# Patient Record
Sex: Female | Born: 1955 | Race: White | Hispanic: No | Smoking: Former smoker
Health system: Southern US, Community
[De-identification: ages and names within clinical notes are randomized; demographics above are authoritative.]

## PROBLEM LIST (undated history)

## (undated) DIAGNOSIS — I2510 Atherosclerotic heart disease of native coronary artery without angina pectoris: Secondary | ICD-10-CM

## (undated) DIAGNOSIS — G8929 Other chronic pain: Secondary | ICD-10-CM

## (undated) DIAGNOSIS — N179 Acute kidney failure, unspecified: Secondary | ICD-10-CM

## (undated) DIAGNOSIS — R569 Unspecified convulsions: Secondary | ICD-10-CM

## (undated) DIAGNOSIS — K5792 Diverticulitis of intestine, part unspecified, without perforation or abscess without bleeding: Secondary | ICD-10-CM

## (undated) DIAGNOSIS — E785 Hyperlipidemia, unspecified: Secondary | ICD-10-CM

## (undated) DIAGNOSIS — I10 Essential (primary) hypertension: Secondary | ICD-10-CM

## (undated) DIAGNOSIS — M1990 Unspecified osteoarthritis, unspecified site: Secondary | ICD-10-CM

## (undated) DIAGNOSIS — G4730 Sleep apnea, unspecified: Secondary | ICD-10-CM

## (undated) DIAGNOSIS — E039 Hypothyroidism, unspecified: Secondary | ICD-10-CM

## (undated) DIAGNOSIS — I779 Disorder of arteries and arterioles, unspecified: Secondary | ICD-10-CM

## (undated) DIAGNOSIS — J45909 Unspecified asthma, uncomplicated: Secondary | ICD-10-CM

## (undated) DIAGNOSIS — I739 Peripheral vascular disease, unspecified: Secondary | ICD-10-CM

## (undated) DIAGNOSIS — M359 Systemic involvement of connective tissue, unspecified: Secondary | ICD-10-CM

## (undated) DIAGNOSIS — I255 Ischemic cardiomyopathy: Secondary | ICD-10-CM

## (undated) DIAGNOSIS — G44209 Tension-type headache, unspecified, not intractable: Secondary | ICD-10-CM

## (undated) DIAGNOSIS — K219 Gastro-esophageal reflux disease without esophagitis: Secondary | ICD-10-CM

## (undated) DIAGNOSIS — IMO0002 Reserved for concepts with insufficient information to code with codable children: Secondary | ICD-10-CM

## (undated) DIAGNOSIS — R001 Bradycardia, unspecified: Secondary | ICD-10-CM

## (undated) DIAGNOSIS — M549 Dorsalgia, unspecified: Secondary | ICD-10-CM

## (undated) DIAGNOSIS — D649 Anemia, unspecified: Secondary | ICD-10-CM

## (undated) DIAGNOSIS — F419 Anxiety disorder, unspecified: Secondary | ICD-10-CM

## (undated) DIAGNOSIS — J449 Chronic obstructive pulmonary disease, unspecified: Secondary | ICD-10-CM

## (undated) DIAGNOSIS — Z9289 Personal history of other medical treatment: Secondary | ICD-10-CM

## (undated) DIAGNOSIS — N200 Calculus of kidney: Secondary | ICD-10-CM

## (undated) DIAGNOSIS — F32A Depression, unspecified: Secondary | ICD-10-CM

## (undated) DIAGNOSIS — F329 Major depressive disorder, single episode, unspecified: Secondary | ICD-10-CM

## (undated) HISTORY — PX: TUBAL LIGATION: SHX77

## (undated) HISTORY — DX: Calculus of kidney: N20.0

## (undated) HISTORY — DX: Essential (primary) hypertension: I10

## (undated) HISTORY — DX: Diverticulitis of intestine, part unspecified, without perforation or abscess without bleeding: K57.92

## (undated) HISTORY — DX: Atherosclerotic heart disease of native coronary artery without angina pectoris: I25.10

## (undated) HISTORY — PX: APPENDECTOMY: SHX54

## (undated) HISTORY — DX: Hyperlipidemia, unspecified: E78.5

## (undated) HISTORY — DX: Gastro-esophageal reflux disease without esophagitis: K21.9

## (undated) HISTORY — DX: Chronic obstructive pulmonary disease, unspecified: J44.9

## (undated) HISTORY — PX: VAGINAL HYSTERECTOMY: SUR661

---

## 1999-10-06 HISTORY — PX: KNEE ARTHROSCOPY W/ ACL RECONSTRUCTION: SHX1858

## 1999-10-06 HISTORY — PX: CARPAL TUNNEL WITH CUBITAL TUNNEL: SHX5608

## 1999-11-26 ENCOUNTER — Ambulatory Visit (HOSPITAL_BASED_OUTPATIENT_CLINIC_OR_DEPARTMENT_OTHER): Admission: RE | Admit: 1999-11-26 | Discharge: 1999-11-27 | Payer: Self-pay | Admitting: Orthopedic Surgery

## 2001-01-22 ENCOUNTER — Emergency Department (HOSPITAL_COMMUNITY): Admission: EM | Admit: 2001-01-22 | Discharge: 2001-01-22 | Payer: Self-pay | Admitting: Emergency Medicine

## 2001-03-09 ENCOUNTER — Ambulatory Visit (HOSPITAL_COMMUNITY): Admission: RE | Admit: 2001-03-09 | Discharge: 2001-03-09 | Payer: Self-pay | Admitting: Family Medicine

## 2001-03-09 ENCOUNTER — Encounter: Payer: Self-pay | Admitting: Family Medicine

## 2001-07-11 ENCOUNTER — Encounter: Payer: Self-pay | Admitting: Emergency Medicine

## 2001-07-11 ENCOUNTER — Emergency Department (HOSPITAL_COMMUNITY): Admission: EM | Admit: 2001-07-11 | Discharge: 2001-07-11 | Payer: Self-pay | Admitting: Emergency Medicine

## 2001-08-04 ENCOUNTER — Encounter: Payer: Self-pay | Admitting: Internal Medicine

## 2001-08-04 ENCOUNTER — Ambulatory Visit (HOSPITAL_COMMUNITY): Admission: RE | Admit: 2001-08-04 | Discharge: 2001-08-04 | Payer: Self-pay | Admitting: Internal Medicine

## 2001-08-23 ENCOUNTER — Ambulatory Visit (HOSPITAL_BASED_OUTPATIENT_CLINIC_OR_DEPARTMENT_OTHER): Admission: RE | Admit: 2001-08-23 | Discharge: 2001-08-24 | Payer: Self-pay | Admitting: Orthopedic Surgery

## 2002-02-14 ENCOUNTER — Emergency Department (HOSPITAL_COMMUNITY): Admission: EM | Admit: 2002-02-14 | Discharge: 2002-02-14 | Payer: Self-pay | Admitting: *Deleted

## 2002-02-14 ENCOUNTER — Encounter: Payer: Self-pay | Admitting: *Deleted

## 2002-04-29 ENCOUNTER — Emergency Department (HOSPITAL_COMMUNITY): Admission: EM | Admit: 2002-04-29 | Discharge: 2002-04-29 | Payer: Self-pay | Admitting: Emergency Medicine

## 2002-04-29 ENCOUNTER — Encounter: Payer: Self-pay | Admitting: Emergency Medicine

## 2002-05-27 ENCOUNTER — Emergency Department (HOSPITAL_COMMUNITY): Admission: EM | Admit: 2002-05-27 | Discharge: 2002-05-27 | Payer: Self-pay | Admitting: *Deleted

## 2002-05-27 ENCOUNTER — Encounter: Payer: Self-pay | Admitting: *Deleted

## 2003-04-07 ENCOUNTER — Emergency Department (HOSPITAL_COMMUNITY): Admission: EM | Admit: 2003-04-07 | Discharge: 2003-04-07 | Payer: Self-pay | Admitting: *Deleted

## 2003-06-16 ENCOUNTER — Emergency Department (HOSPITAL_COMMUNITY): Admission: EM | Admit: 2003-06-16 | Discharge: 2003-06-16 | Payer: Self-pay | Admitting: Emergency Medicine

## 2003-10-06 ENCOUNTER — Emergency Department (HOSPITAL_COMMUNITY): Admission: EM | Admit: 2003-10-06 | Discharge: 2003-10-06 | Payer: Self-pay | Admitting: Emergency Medicine

## 2003-10-06 HISTORY — PX: CAROTID ENDARTERECTOMY: SUR193

## 2003-10-06 HISTORY — PX: CORONARY ARTERY BYPASS GRAFT: SHX141

## 2004-05-08 ENCOUNTER — Inpatient Hospital Stay (HOSPITAL_COMMUNITY): Admission: EM | Admit: 2004-05-08 | Discharge: 2004-05-19 | Payer: Self-pay | Admitting: Emergency Medicine

## 2004-05-13 ENCOUNTER — Encounter (INDEPENDENT_AMBULATORY_CARE_PROVIDER_SITE_OTHER): Payer: Self-pay | Admitting: Specialist

## 2004-05-15 ENCOUNTER — Encounter: Payer: Self-pay | Admitting: Cardiothoracic Surgery

## 2004-06-10 ENCOUNTER — Ambulatory Visit (HOSPITAL_COMMUNITY): Admission: RE | Admit: 2004-06-10 | Discharge: 2004-06-10 | Payer: Self-pay | Admitting: *Deleted

## 2004-09-09 ENCOUNTER — Ambulatory Visit: Payer: Self-pay | Admitting: Physician Assistant

## 2004-10-31 ENCOUNTER — Ambulatory Visit (HOSPITAL_COMMUNITY): Admission: RE | Admit: 2004-10-31 | Discharge: 2004-10-31 | Payer: Self-pay | Admitting: Family Medicine

## 2004-11-19 ENCOUNTER — Emergency Department (HOSPITAL_COMMUNITY): Admission: EM | Admit: 2004-11-19 | Discharge: 2004-11-20 | Payer: Self-pay | Admitting: Emergency Medicine

## 2004-12-08 ENCOUNTER — Ambulatory Visit: Payer: Self-pay | Admitting: *Deleted

## 2005-02-07 ENCOUNTER — Emergency Department (HOSPITAL_COMMUNITY): Admission: EM | Admit: 2005-02-07 | Discharge: 2005-02-07 | Payer: Self-pay | Admitting: Emergency Medicine

## 2005-03-27 ENCOUNTER — Ambulatory Visit: Payer: Self-pay | Admitting: Cardiovascular Disease

## 2006-01-24 ENCOUNTER — Emergency Department (HOSPITAL_COMMUNITY): Admission: EM | Admit: 2006-01-24 | Discharge: 2006-01-24 | Payer: Self-pay | Admitting: Emergency Medicine

## 2006-07-23 ENCOUNTER — Emergency Department (HOSPITAL_COMMUNITY): Admission: EM | Admit: 2006-07-23 | Discharge: 2006-07-23 | Payer: Self-pay | Admitting: Emergency Medicine

## 2006-07-26 ENCOUNTER — Ambulatory Visit: Payer: Self-pay | Admitting: Gastroenterology

## 2006-07-26 ENCOUNTER — Inpatient Hospital Stay (HOSPITAL_COMMUNITY): Admission: EM | Admit: 2006-07-26 | Discharge: 2006-08-02 | Payer: Self-pay | Admitting: Emergency Medicine

## 2006-07-26 ENCOUNTER — Ambulatory Visit: Payer: Self-pay | Admitting: Cardiology

## 2006-07-30 ENCOUNTER — Encounter (INDEPENDENT_AMBULATORY_CARE_PROVIDER_SITE_OTHER): Payer: Self-pay | Admitting: *Deleted

## 2008-05-11 ENCOUNTER — Emergency Department (HOSPITAL_COMMUNITY): Admission: EM | Admit: 2008-05-11 | Discharge: 2008-05-11 | Payer: Self-pay | Admitting: Emergency Medicine

## 2008-07-10 ENCOUNTER — Emergency Department (HOSPITAL_COMMUNITY): Admission: EM | Admit: 2008-07-10 | Discharge: 2008-07-10 | Payer: Self-pay | Admitting: Emergency Medicine

## 2008-10-05 HISTORY — PX: LAPAROSCOPIC CHOLECYSTECTOMY: SUR755

## 2008-12-15 ENCOUNTER — Emergency Department (HOSPITAL_COMMUNITY): Admission: EM | Admit: 2008-12-15 | Discharge: 2008-12-16 | Payer: Self-pay | Admitting: Emergency Medicine

## 2009-04-17 ENCOUNTER — Ambulatory Visit: Payer: Self-pay | Admitting: Internal Medicine

## 2009-04-17 ENCOUNTER — Inpatient Hospital Stay (HOSPITAL_COMMUNITY): Admission: EM | Admit: 2009-04-17 | Discharge: 2009-04-26 | Payer: Self-pay | Admitting: Emergency Medicine

## 2009-04-17 ENCOUNTER — Ambulatory Visit: Payer: Self-pay | Admitting: Cardiology

## 2009-04-18 ENCOUNTER — Ambulatory Visit: Payer: Self-pay | Admitting: Internal Medicine

## 2009-04-19 ENCOUNTER — Telehealth (INDEPENDENT_AMBULATORY_CARE_PROVIDER_SITE_OTHER): Payer: Self-pay | Admitting: *Deleted

## 2009-04-20 ENCOUNTER — Ambulatory Visit: Payer: Self-pay | Admitting: Gastroenterology

## 2009-04-21 ENCOUNTER — Ambulatory Visit: Payer: Self-pay | Admitting: Gastroenterology

## 2009-04-22 ENCOUNTER — Ambulatory Visit: Payer: Self-pay | Admitting: Gastroenterology

## 2009-04-23 ENCOUNTER — Encounter: Payer: Self-pay | Admitting: Cardiology

## 2009-04-24 ENCOUNTER — Ambulatory Visit: Payer: Self-pay | Admitting: Gastroenterology

## 2009-04-29 ENCOUNTER — Encounter: Payer: Self-pay | Admitting: Gastroenterology

## 2009-05-13 ENCOUNTER — Encounter: Payer: Self-pay | Admitting: Internal Medicine

## 2009-05-27 DIAGNOSIS — J449 Chronic obstructive pulmonary disease, unspecified: Secondary | ICD-10-CM | POA: Insufficient documentation

## 2009-05-27 DIAGNOSIS — R569 Unspecified convulsions: Secondary | ICD-10-CM

## 2009-05-27 DIAGNOSIS — F1910 Other psychoactive substance abuse, uncomplicated: Secondary | ICD-10-CM | POA: Insufficient documentation

## 2009-05-27 DIAGNOSIS — I10 Essential (primary) hypertension: Secondary | ICD-10-CM | POA: Insufficient documentation

## 2009-05-27 DIAGNOSIS — I739 Peripheral vascular disease, unspecified: Secondary | ICD-10-CM | POA: Insufficient documentation

## 2009-05-27 DIAGNOSIS — E785 Hyperlipidemia, unspecified: Secondary | ICD-10-CM | POA: Insufficient documentation

## 2009-05-27 DIAGNOSIS — K219 Gastro-esophageal reflux disease without esophagitis: Secondary | ICD-10-CM | POA: Insufficient documentation

## 2009-06-04 ENCOUNTER — Telehealth (INDEPENDENT_AMBULATORY_CARE_PROVIDER_SITE_OTHER): Payer: Self-pay

## 2009-06-24 ENCOUNTER — Ambulatory Visit: Payer: Self-pay | Admitting: Cardiology

## 2009-06-28 ENCOUNTER — Encounter (INDEPENDENT_AMBULATORY_CARE_PROVIDER_SITE_OTHER): Payer: Self-pay | Admitting: *Deleted

## 2009-06-28 ENCOUNTER — Ambulatory Visit (HOSPITAL_COMMUNITY): Admission: RE | Admit: 2009-06-28 | Discharge: 2009-06-28 | Payer: Self-pay | Admitting: Cardiology

## 2009-06-28 ENCOUNTER — Encounter: Payer: Self-pay | Admitting: Cardiology

## 2009-06-28 LAB — CONVERTED CEMR LAB
AST: 19 units/L
BUN: 10 mg/dL
Calcium: 10.1 mg/dL
Cholesterol: 200 mg/dL
Creatinine, Ser: 0.9 mg/dL
Glucose, Bld: 92 mg/dL
HDL: 35 mg/dL
Potassium: 4.6 meq/L
Triglycerides: 342 mg/dL

## 2009-07-01 LAB — CONVERTED CEMR LAB
ALT: 26 units/L (ref 0–35)
AST: 19 units/L (ref 0–37)
Alkaline Phosphatase: 84 units/L (ref 39–117)
Cholesterol: 200 mg/dL (ref 0–200)
Creatinine, Ser: 0.9 mg/dL (ref 0.40–1.20)
LDL Cholesterol: 97 mg/dL (ref 0–99)
Sodium: 141 meq/L (ref 135–145)
Total Bilirubin: 0.3 mg/dL (ref 0.3–1.2)
Total CHOL/HDL Ratio: 5.7
Total Protein: 7.6 g/dL (ref 6.0–8.3)
VLDL: 68 mg/dL — ABNORMAL HIGH (ref 0–40)

## 2009-07-02 ENCOUNTER — Encounter (INDEPENDENT_AMBULATORY_CARE_PROVIDER_SITE_OTHER): Payer: Self-pay | Admitting: *Deleted

## 2009-07-02 ENCOUNTER — Encounter: Payer: Self-pay | Admitting: Cardiology

## 2009-07-02 ENCOUNTER — Telehealth (INDEPENDENT_AMBULATORY_CARE_PROVIDER_SITE_OTHER): Payer: Self-pay | Admitting: *Deleted

## 2009-07-08 ENCOUNTER — Telehealth (INDEPENDENT_AMBULATORY_CARE_PROVIDER_SITE_OTHER): Payer: Self-pay

## 2009-07-09 ENCOUNTER — Telehealth (INDEPENDENT_AMBULATORY_CARE_PROVIDER_SITE_OTHER): Payer: Self-pay

## 2009-07-09 ENCOUNTER — Telehealth (INDEPENDENT_AMBULATORY_CARE_PROVIDER_SITE_OTHER): Payer: Self-pay | Admitting: *Deleted

## 2009-07-10 ENCOUNTER — Telehealth (INDEPENDENT_AMBULATORY_CARE_PROVIDER_SITE_OTHER): Payer: Self-pay

## 2009-07-10 ENCOUNTER — Encounter: Payer: Self-pay | Admitting: Gastroenterology

## 2009-07-16 ENCOUNTER — Ambulatory Visit (HOSPITAL_COMMUNITY): Admission: RE | Admit: 2009-07-16 | Discharge: 2009-07-16 | Payer: Self-pay | Admitting: Gastroenterology

## 2009-07-17 ENCOUNTER — Ambulatory Visit: Payer: Self-pay | Admitting: Cardiology

## 2009-07-17 ENCOUNTER — Ambulatory Visit (HOSPITAL_COMMUNITY): Admission: RE | Admit: 2009-07-17 | Discharge: 2009-07-17 | Payer: Self-pay | Admitting: Cardiology

## 2009-07-17 ENCOUNTER — Encounter: Payer: Self-pay | Admitting: Cardiology

## 2009-07-18 ENCOUNTER — Telehealth: Payer: Self-pay | Admitting: Cardiology

## 2009-07-24 ENCOUNTER — Encounter (INDEPENDENT_AMBULATORY_CARE_PROVIDER_SITE_OTHER): Payer: Self-pay | Admitting: *Deleted

## 2009-07-24 ENCOUNTER — Encounter: Payer: Self-pay | Admitting: Cardiology

## 2009-09-18 ENCOUNTER — Encounter (INDEPENDENT_AMBULATORY_CARE_PROVIDER_SITE_OTHER): Payer: Self-pay | Admitting: *Deleted

## 2009-10-05 HISTORY — PX: CORONARY ANGIOPLASTY WITH STENT PLACEMENT: SHX49

## 2009-10-10 ENCOUNTER — Telehealth (INDEPENDENT_AMBULATORY_CARE_PROVIDER_SITE_OTHER): Payer: Self-pay

## 2009-10-14 ENCOUNTER — Encounter (INDEPENDENT_AMBULATORY_CARE_PROVIDER_SITE_OTHER): Payer: Self-pay | Admitting: *Deleted

## 2009-12-18 ENCOUNTER — Ambulatory Visit: Payer: Self-pay | Admitting: Cardiology

## 2009-12-18 DIAGNOSIS — R079 Chest pain, unspecified: Secondary | ICD-10-CM | POA: Insufficient documentation

## 2010-01-17 ENCOUNTER — Telehealth: Payer: Self-pay | Admitting: Cardiology

## 2010-01-23 ENCOUNTER — Ambulatory Visit: Payer: Self-pay | Admitting: Cardiology

## 2010-01-23 DIAGNOSIS — Z951 Presence of aortocoronary bypass graft: Secondary | ICD-10-CM | POA: Insufficient documentation

## 2010-01-23 DIAGNOSIS — R519 Headache, unspecified: Secondary | ICD-10-CM | POA: Insufficient documentation

## 2010-01-23 DIAGNOSIS — I209 Angina pectoris, unspecified: Secondary | ICD-10-CM | POA: Insufficient documentation

## 2010-01-23 DIAGNOSIS — R51 Headache: Secondary | ICD-10-CM

## 2010-02-05 ENCOUNTER — Ambulatory Visit: Payer: Self-pay | Admitting: Cardiology

## 2010-02-05 ENCOUNTER — Encounter (HOSPITAL_COMMUNITY): Admission: RE | Admit: 2010-02-05 | Discharge: 2010-02-05 | Payer: Self-pay | Admitting: Cardiology

## 2010-02-10 ENCOUNTER — Telehealth (INDEPENDENT_AMBULATORY_CARE_PROVIDER_SITE_OTHER): Payer: Self-pay

## 2010-02-14 ENCOUNTER — Ambulatory Visit: Payer: Self-pay | Admitting: Cardiology

## 2010-02-14 ENCOUNTER — Encounter (INDEPENDENT_AMBULATORY_CARE_PROVIDER_SITE_OTHER): Payer: Self-pay | Admitting: *Deleted

## 2010-02-17 ENCOUNTER — Inpatient Hospital Stay (HOSPITAL_BASED_OUTPATIENT_CLINIC_OR_DEPARTMENT_OTHER): Admission: RE | Admit: 2010-02-17 | Discharge: 2010-02-17 | Payer: Self-pay | Admitting: Cardiology

## 2010-02-17 ENCOUNTER — Encounter (INDEPENDENT_AMBULATORY_CARE_PROVIDER_SITE_OTHER): Payer: Self-pay | Admitting: *Deleted

## 2010-02-17 ENCOUNTER — Ambulatory Visit: Payer: Self-pay | Admitting: Cardiology

## 2010-02-17 LAB — CONVERTED CEMR LAB
ALT: 19 units/L (ref 0–35)
AST: 18 units/L (ref 0–37)
Albumin: 4.6 g/dL (ref 3.5–5.2)
Calcium: 9.8 mg/dL (ref 8.4–10.5)
Chloride: 100 meq/L (ref 96–112)
Eosinophils Absolute: 0.2 10*3/uL (ref 0.0–0.7)
Eosinophils Relative: 3 % (ref 0–5)
HCT: 38.4 % (ref 36.0–46.0)
Hemoglobin: 12.4 g/dL (ref 12.0–15.0)
INR: 1.09 (ref <1.50)
Lymphocytes Relative: 30 % (ref 12–46)
Lymphs Abs: 2.5 10*3/uL (ref 0.7–4.0)
MCV: 93 fL (ref 78.0–100.0)
Monocytes Absolute: 0.6 10*3/uL (ref 0.1–1.0)
Potassium: 4.5 meq/L (ref 3.5–5.3)
RDW: 14.4 % (ref 11.5–15.5)
Total Protein: 7.4 g/dL (ref 6.0–8.3)
aPTT: 32 s (ref 24–37)

## 2010-02-20 ENCOUNTER — Encounter: Payer: Self-pay | Admitting: Cardiology

## 2010-02-20 ENCOUNTER — Telehealth (INDEPENDENT_AMBULATORY_CARE_PROVIDER_SITE_OTHER): Payer: Self-pay

## 2010-02-21 LAB — CONVERTED CEMR LAB
Calcium: 9.1 mg/dL (ref 8.4–10.5)
Creatinine, Ser: 1.25 mg/dL — ABNORMAL HIGH (ref 0.40–1.20)

## 2010-02-24 ENCOUNTER — Inpatient Hospital Stay (HOSPITAL_COMMUNITY): Admission: RE | Admit: 2010-02-24 | Discharge: 2010-02-25 | Payer: Self-pay | Admitting: Cardiology

## 2010-02-24 ENCOUNTER — Ambulatory Visit: Payer: Self-pay | Admitting: Cardiology

## 2010-03-25 ENCOUNTER — Encounter (INDEPENDENT_AMBULATORY_CARE_PROVIDER_SITE_OTHER): Payer: Self-pay | Admitting: *Deleted

## 2010-04-09 ENCOUNTER — Encounter: Payer: Self-pay | Admitting: Cardiology

## 2010-04-27 ENCOUNTER — Emergency Department (HOSPITAL_COMMUNITY): Admission: EM | Admit: 2010-04-27 | Discharge: 2010-04-27 | Payer: Self-pay | Admitting: Emergency Medicine

## 2010-06-06 ENCOUNTER — Encounter: Payer: Self-pay | Admitting: Cardiology

## 2010-06-27 ENCOUNTER — Encounter (INDEPENDENT_AMBULATORY_CARE_PROVIDER_SITE_OTHER): Payer: Self-pay | Admitting: *Deleted

## 2010-07-03 ENCOUNTER — Ambulatory Visit (HOSPITAL_COMMUNITY): Admission: RE | Admit: 2010-07-03 | Discharge: 2010-07-03 | Payer: Self-pay | Admitting: Cardiology

## 2010-07-04 ENCOUNTER — Encounter: Payer: Self-pay | Admitting: Cardiology

## 2010-07-09 ENCOUNTER — Telehealth (INDEPENDENT_AMBULATORY_CARE_PROVIDER_SITE_OTHER): Payer: Self-pay

## 2010-07-14 ENCOUNTER — Ambulatory Visit: Payer: Self-pay | Admitting: Internal Medicine

## 2010-07-14 DIAGNOSIS — K5732 Diverticulitis of large intestine without perforation or abscess without bleeding: Secondary | ICD-10-CM

## 2010-07-14 DIAGNOSIS — R197 Diarrhea, unspecified: Secondary | ICD-10-CM | POA: Insufficient documentation

## 2010-07-14 HISTORY — DX: Diverticulitis of large intestine without perforation or abscess without bleeding: K57.32

## 2010-07-15 ENCOUNTER — Ambulatory Visit (HOSPITAL_COMMUNITY): Admission: RE | Admit: 2010-07-15 | Discharge: 2010-07-15 | Payer: Self-pay | Admitting: Internal Medicine

## 2010-07-16 ENCOUNTER — Telehealth (INDEPENDENT_AMBULATORY_CARE_PROVIDER_SITE_OTHER): Payer: Self-pay

## 2010-07-23 ENCOUNTER — Telehealth (INDEPENDENT_AMBULATORY_CARE_PROVIDER_SITE_OTHER): Payer: Self-pay

## 2010-08-08 ENCOUNTER — Ambulatory Visit: Payer: Self-pay | Admitting: Cardiology

## 2010-08-08 ENCOUNTER — Telehealth (INDEPENDENT_AMBULATORY_CARE_PROVIDER_SITE_OTHER): Payer: Self-pay

## 2010-08-08 ENCOUNTER — Inpatient Hospital Stay (HOSPITAL_COMMUNITY): Admission: EM | Admit: 2010-08-08 | Discharge: 2010-08-12 | Payer: Self-pay | Admitting: Cardiology

## 2010-08-08 ENCOUNTER — Encounter: Payer: Self-pay | Admitting: Emergency Medicine

## 2010-09-05 ENCOUNTER — Ambulatory Visit: Payer: Self-pay | Admitting: Cardiology

## 2010-09-19 ENCOUNTER — Encounter: Payer: Self-pay | Admitting: Gastroenterology

## 2010-09-19 ENCOUNTER — Telehealth (INDEPENDENT_AMBULATORY_CARE_PROVIDER_SITE_OTHER): Payer: Self-pay | Admitting: *Deleted

## 2010-10-03 ENCOUNTER — Telehealth (INDEPENDENT_AMBULATORY_CARE_PROVIDER_SITE_OTHER): Payer: Self-pay | Admitting: *Deleted

## 2010-10-04 ENCOUNTER — Emergency Department (HOSPITAL_COMMUNITY)
Admission: EM | Admit: 2010-10-04 | Discharge: 2010-10-04 | Payer: Self-pay | Source: Home / Self Care | Admitting: Emergency Medicine

## 2010-10-05 HISTORY — PX: CARDIAC CATHETERIZATION: SHX172

## 2010-10-07 ENCOUNTER — Ambulatory Visit: Admit: 2010-10-07 | Payer: Self-pay | Admitting: Cardiology

## 2010-10-24 ENCOUNTER — Encounter (INDEPENDENT_AMBULATORY_CARE_PROVIDER_SITE_OTHER): Payer: Self-pay | Admitting: *Deleted

## 2010-10-24 ENCOUNTER — Ambulatory Visit: Admit: 2010-10-24 | Payer: Self-pay | Admitting: Adult Health

## 2010-10-26 ENCOUNTER — Encounter: Payer: Self-pay | Admitting: Cardiology

## 2010-10-30 ENCOUNTER — Telehealth (INDEPENDENT_AMBULATORY_CARE_PROVIDER_SITE_OTHER): Payer: Self-pay

## 2010-10-30 ENCOUNTER — Inpatient Hospital Stay (HOSPITAL_COMMUNITY)
Admission: EM | Admit: 2010-10-30 | Discharge: 2010-11-01 | Payer: Self-pay | Source: Home / Self Care | Attending: Cardiology | Admitting: Cardiology

## 2010-10-30 LAB — BASIC METABOLIC PANEL
BUN: 20 mg/dL (ref 6–23)
CO2: 27 mEq/L (ref 19–32)
Creatinine, Ser: 1.15 mg/dL (ref 0.4–1.2)
Glucose, Bld: 117 mg/dL — ABNORMAL HIGH (ref 70–99)
Potassium: 4.1 mEq/L (ref 3.5–5.1)
Sodium: 139 mEq/L (ref 135–145)

## 2010-10-30 LAB — PROTIME-INR
INR: 0.94 (ref 0.00–1.49)
Prothrombin Time: 12.8 seconds (ref 11.6–15.2)

## 2010-10-30 LAB — POCT CARDIAC MARKERS
CKMB, poc: 1.9 ng/mL (ref 1.0–8.0)
Myoglobin, poc: 55.3 ng/mL (ref 12–200)

## 2010-10-30 LAB — DIFFERENTIAL
Basophils Relative: 1 % (ref 0–1)
Eosinophils Absolute: 0.2 10*3/uL (ref 0.0–0.7)
Eosinophils Relative: 2 % (ref 0–5)
Lymphs Abs: 1.8 10*3/uL (ref 0.7–4.0)
Monocytes Absolute: 0.6 10*3/uL (ref 0.1–1.0)
Monocytes Relative: 8 % (ref 3–12)

## 2010-10-30 LAB — CBC
HCT: 33.1 % — ABNORMAL LOW (ref 36.0–46.0)
MCV: 91.4 fL (ref 78.0–100.0)

## 2010-10-30 LAB — BRAIN NATRIURETIC PEPTIDE: Pro B Natriuretic peptide (BNP): 314 pg/mL — ABNORMAL HIGH (ref 0.0–100.0)

## 2010-10-30 LAB — CK TOTAL AND CKMB (NOT AT ARMC)
CK, MB: 2.9 ng/mL (ref 0.3–4.0)
Relative Index: 2.4 (ref 0.0–2.5)

## 2010-10-30 LAB — MRSA PCR SCREENING: MRSA by PCR: NEGATIVE

## 2010-10-30 LAB — MAGNESIUM: Magnesium: 2.2 mg/dL (ref 1.5–2.5)

## 2010-10-31 LAB — T4, FREE: Free T4: 0.62 ng/dL — ABNORMAL LOW (ref 0.80–1.80)

## 2010-10-31 LAB — TSH
TSH: 48.288 u[IU]/mL — ABNORMAL HIGH (ref 0.350–4.500)
TSH: 16.793 u[IU]/mL — ABNORMAL HIGH (ref 0.350–4.500)

## 2010-10-31 LAB — LIPID PANEL
Cholesterol: 169 mg/dL (ref 0–200)
LDL Cholesterol: 55 mg/dL (ref 0–99)
Triglycerides: 358 mg/dL — ABNORMAL HIGH (ref <150)
VLDL: 72 mg/dL — ABNORMAL HIGH (ref 0–40)

## 2010-10-31 LAB — BASIC METABOLIC PANEL
BUN: 26 mg/dL — ABNORMAL HIGH (ref 6–23)
Chloride: 102 mEq/L (ref 96–112)
Creatinine, Ser: 0.85 mg/dL (ref 0.4–1.2)
Glucose, Bld: 125 mg/dL — ABNORMAL HIGH (ref 70–99)
Potassium: 4.3 mEq/L (ref 3.5–5.1)

## 2010-10-31 LAB — CBC
HCT: 32.3 % — ABNORMAL LOW (ref 36.0–46.0)
Hemoglobin: 10.2 g/dL — ABNORMAL LOW (ref 12.0–15.0)
MCH: 29.7 pg (ref 26.0–34.0)
MCHC: 31.6 g/dL (ref 30.0–36.0)
RDW: 14.7 % (ref 11.5–15.5)

## 2010-10-31 LAB — T3, FREE: T3, Free: 2.4 pg/mL (ref 2.3–4.2)

## 2010-10-31 LAB — CARDIAC PANEL(CRET KIN+CKTOT+MB+TROPI)
CK, MB: 3.1 ng/mL (ref 0.3–4.0)
CK, MB: 3.2 ng/mL (ref 0.3–4.0)
Total CK: 120 U/L (ref 7–177)
Total CK: 92 U/L (ref 7–177)

## 2010-10-31 LAB — HEPARIN LEVEL (UNFRACTIONATED): Heparin Unfractionated: 0.18 IU/mL — ABNORMAL LOW (ref 0.30–0.70)

## 2010-10-31 NOTE — Discharge Summary (Addendum)
Toni [name], [name] NO.:  [id number]  MEDICAL RECORD NO.:  [id number]          PATIENT TYPE:  INP  LOCATION:  2604                         FACILITY:  [facility]  PHYSICIAN:  Marca Ancona, MD      DATE OF BIRTH:  11/25/1955  DATE OF ADMISSION:  10/30/2010 DATE OF DISCHARGE:                              DISCHARGE SUMMARY   PRIMARY CARDIOLOGIST:  Jesse Sans. Wall, MD, [facility]  PRIMARY MEDICAL DOCTOR:  [facility].  CHIEF COMPLAINT:  Chest pain.  HISTORY OF PRESENT ILLNESS:  Toni [name] is a 55 year old female with a complicated history of CAD, hypertension, PVD, and ischemic cardiomyopathy with an EF last assessed at 35-40% who presents with an episode of chest pain.  She states over the past several weeks, she has had exertional chest pain, like a tightness whenever she exerts herself. She has had to take several nitroglycerin on a daily basis.  This morning around 11 while walking to her mail box, felt hurting and tightness in her chest with some radiation to her left shoulder and arm. She also gets short of breath as well as orthopnea and is not able to sleep lying down.  With this particular episode with the the mail box, sat down, took 1 nitroglycerin.  10-15 minutes later, she still felt her chest was heavy and did not feel right, so took a second nitroglycerin. With the third nitroglycerin, she called [facility] and was told to call 911 as the pain did not subside in 5 minutes.  She ended up calling EMS.  En route, they gave her morphine which did help to ease off her pain and she is since feeling better on a nitro drip here.  Her EKG shows normal sinus rhythm with nonspecific slight anterior ST depression with lateral T-wave inversions.  There is a loss of RSR pattern seen on her December 2011 EKG.  Cardiac enzymes were negative x1.  PAST MEDICAL HISTORY: 1. Coronary artery disease.     a.     Status post CABG in 2005 with LIMA  to the large diagonal,      SVG to the LAD, SVG to the circumflex, and SVG to the PDA.     b.     Status post PCI and bare-metal stenting with Zilver stent on      Feb 25, 2010.     c. NSTEMI with stenosis in the stents and segment of the mid body of     the SVG to the PDA status post successful PTCA/ION drug-eluting     stent placement August 11, 2010.  Two out of four grafts were     patent by cath at that time. 2. Ischemic cardiomyopathy with EF of 35-40% by cath August 11, 2010. 3. Hyperlipidemia. 4. GERD. 5. Tobacco abuse. 6. COPD. 7. Anxiety. 8. Peripheral vascular disease with bilateral carotid artery stenosis     50-69% by Doppler in 2011. 9.History of remote seizure disorder.  The patient states she has     been experiencing some stress-induced seizures and had 2 of them     pn  New Years' and is taking carbamazepine for this. 12.History of diverticulitis. 13.Asymptomatic sinus bradycardia. 14.History of nonsustained VT. 15.Hypothyroidism.  MEDICATIONS: 1. Alprazolam 1 mg q.i.d. p.r.n. 2. Prozac 20 mg daily. 3. Lovastatin 40 mg 2 tablets at bedtime. 4. Trazodone 50 mg 1/2 tablet to 1 tablet as needed. 5. Aleve 220 mg daily as needed. 6. Nitrostat 0.4 mg sublingual as needed. 7. Nexium 40 mg daily. 8. Effient 10 mg daily. 9. Metoprolol tartrate 25 mg 1/2 tablet b.i.d. 10.Synthroid 50 mcg daily. 11.Aspirin 325 mg daily. 12.Ultracet 37.5/325 as directed for pain. 13.Benazepril  20 mg q.a.m. 14.Carbamazepine 200 mg b.i.d.  ALLERGIES:  No known drug allergies.  SOCIAL HISTORY:  Toni [name] lives in [place] with her husband.  She has 3 children.  She quit smoking but still states she is in environment where she is exposed to much second hand smoke.  She denies any alcohol use.  FAMILY HISTORY:  Her mother passed away of an MI at age 30.  Father passed away of an MI at 69.  REVIEW OF SYSTEMS:  No fevers, chills, nausea, or vomiting.  Positive for chest pain,  shortness of breath, and dyspnea on exertion as above. All other systems reviewed and otherwise negative except for those noted in the HPI.  LABORATORY DATA:  WBC 7.3, hemoglobin 10.6, hematocrit 33.1, platelet count 274,000.  Sodium 139, potassium 4.1, chloride 105, CO2 27, glucose 117, BUN 20, creatinine 1.15.  Cardiac enzymes negative x1.  Chest x-ray, cardiomegaly with no pulmonary edema.  EKG, normal sinus rhythm, nonspecific slight anterior ST depression with lateral T-wave inversions with loss of RSR pattern seen in prior EKG December 2011.  PHYSICAL EXAMINATION:  VITAL SIGNS:  Temperature 98.2, pulse 62, respirations 20, blood pressure 130/67, pulse ox 96% on 2 liters per minute. GENERAL:  This patient is an obese white female in no acute distress who appears elder than stated age. HEENT:  Normocephalic, atraumatic with extraocular movements intact. Clear sclerae.  Nares are without discharge. NECK:  Thick with a soft bilateral carotid bruit.  She has JVD at 9 cm. HEART:  Auscultation reveals regular rate and rhythm, S1, S2 with 2/6 systolic ejection murmur.  Pulses are 2+ and equal. LUNGS:  Has slight crackles at the bases bilaterally. ABDOMEN:  Soft, obese, nontender, nondistended with positive bowel sounds. EXTREMITIES:  Warm and dry without edema. NEUROLOGIC:  She is alert and oriented x3, responds to questions appropriately with a normal affect.  ASSESSMENT/PLAN:  The patient was seen and examined by Dr. Shirlee Latch and myself.  This is a 55 year old female with past medical history including coronary artery disease and CABG and ischemic cardiomyopathy who presents with prolonged chest pain.  She has had exertional chest pain, dyspnea for approximately 2 weeks since her last PCI but has done acutely worse today.  She is currently on a nitro drip and comfortable. Enzymes are negative x1 and EKG is slightly different from those 4. 1. Chest pain worrisome for unstable  angina.  She has had exertional     chest pain since about 2 weeks post PCI since November 2011, but     has always resolved with rest and 1 nitroglycerin.  Today with     multiple nitroglycerin doses, she has had no resolution.  It lasted     approximately for 30-45 minutes and resolves with EMS dosing on     morphine.  At this time, we plan to admit and cycle cardiac     enzymes.  We will check serial EKGs.  She will be started on     heparin drip and her Effient and aspirin as well as other cardiac     medications will be continued.  We will plan for left heart     catheterization in the morning.  If she has no option for     intervention, she may need more aggressive antianginal treatment     with medications such as Imdur, plus or minus ranolazine. 2. Congestive heart failure.  The patient is volume overloaded on     exam, though not severely.  She has New York Heart Association     class III symptoms.  EF is 35-40% by LV gram November 2011.  At     this time, we will check a BMP as well as a 2-D echocardiogram.     She is not on any home Lasix and we will try Lasix 40 mg IV b.i.d.     for now with her morning dose held in anticipation for     catheterization.  Her Lopressor will be changed to Coreg for herheart failure and her ACE will be continued.     Ronie Spies, P.A.C.   ______________________________ Marca Ancona, MD    DD/MEDQ  D:  10/30/2010  T:  10/31/2010  Job:  [id number]  cc:   Thomas C. Wall, MD, [facility] Department  Electronically Signed by Ronie Spies  on 10/31/2010 10:44:37 AM Electronically Signed by Marca Ancona MD on 11/13/2010 08:33:43 AM

## 2010-11-01 LAB — CBC
Hemoglobin: 10.8 g/dL — ABNORMAL LOW (ref 12.0–15.0)
MCHC: 31.5 g/dL (ref 30.0–36.0)
RBC: 3.69 MIL/uL — ABNORMAL LOW (ref 3.87–5.11)
WBC: 9.3 10*3/uL (ref 4.0–10.5)

## 2010-11-01 LAB — BASIC METABOLIC PANEL
Chloride: 97 mEq/L (ref 96–112)
Creatinine, Ser: 0.86 mg/dL (ref 0.4–1.2)
GFR calc Af Amer: >60 mL/min (ref >60)
Potassium: 3.8 mEq/L (ref 3.5–5.1)

## 2010-11-01 LAB — HEPARIN LEVEL (UNFRACTIONATED): Heparin Unfractionated: <0.1 IU/mL — ABNORMAL LOW (ref 0.30–0.70)

## 2010-11-03 LAB — POCT I-STAT 3, ART BLOOD GAS (G3+)
pCO2 arterial: 49.5 mmHg — ABNORMAL HIGH (ref 35.0–45.0)
pH, Arterial: 7.348 — ABNORMAL LOW (ref 7.350–7.400)

## 2010-11-03 LAB — POCT I-STAT 3, VENOUS BLOOD GAS (G3P V)
TCO2: 29 mmol/L (ref 0–100)
pCO2, Ven: 51.4 mmHg — ABNORMAL HIGH (ref 45.0–50.0)
pH, Ven: 7.329 — ABNORMAL HIGH (ref 7.250–7.300)

## 2010-11-03 LAB — POCT ACTIVATED CLOTTING TIME: Activated Clotting Time: 117 seconds

## 2010-11-06 NOTE — Letter (Signed)
Summary: Cardiac Catheterization Instructions- JV Lab  [facility] [place]  [address] [place] [address]   Phone: [phone]  Fax: [phone]     02/14/2010 MRN: [id number]  [name] [address] [place], [place]  [address]  Dear Ms. Cress,   You are scheduled for a Cardiac Catheterization on 02/17/2010 with Dr.Brodie.  Please arrive to the 1st floor of the Heart and Vascular Center at [facility] at 10:00 am  on the day of your procedure. Please do not arrive before 6:30 a.m. Call the Heart and Vascular Center at [phone] if you are unable to make your appointmnet. The Code to get into the parking garage under the building is0010. Take the elevators to the 1st floor. You must have someone to drive you home. Someone must be with you for the first 24 hours after you arrive home. Please wear clothes that are easy to get on and off and wear slip-on shoes. Do not eat or drink after midnight except water with your medications that morning. Bring all your medications and current insurance cards with you.  ___ DO NOT take these medications before your procedure: ________________________________________________________________  ___ Make sure you take your aspirin.  _x__ You may take ALL of your medications with water that morning. ________________________________________________________________________________________________________________________________  ___ DO NOT take ANY medications before your procedure.  ___ Pre-med instructions:  ________________________________________________________________________________________________________________________________  The usual length of stay after your procedure is 2 to 3 hours. This can vary.  If you have any questions, please call the office at the number listed above.   Teressa Lower RN

## 2010-11-06 NOTE — Miscellaneous (Signed)
Summary: Orders Update  Clinical Lists Changes  Orders: Added new Referral order of Carotid Duplex (Carotid Duplex) - Signed 

## 2010-11-06 NOTE — Progress Notes (Signed)
Summary: Results  Phone Note Call from Patient   Caller: Patient Reason for Call: Lab or Test Results Summary of Call: pt would like results of stress test done last week/tg Initial call taken by: Raechel Ache [facility],  Feb 10, 2010 11:36 AM  Follow-up for Phone Call        Phone is busy at this time, will continue to call. Follow-up by: Larita Fife Via LPN,  Feb 10, 8118 1:23 PM

## 2010-11-06 NOTE — Progress Notes (Signed)
Summary: pt request change in pain meds  Phone Note Call from Patient Call back at Home Phone [phone]   Caller: Patient Summary of Call: pt called-she was given vicodin 5/500 and it is not helping at all. pt stated it was just like taking tylenol. pt requesting something else for pain. please advise Initial call taken by: Hendricks Limes LPN,  July 16, 2010 2:10 PM     Appended Document: pt request change in pain meds    Prescriptions: OXYCODONE-ACETAMINOPHEN 7.5-325 MG TABS (OXYCODONE-ACETAMINOPHEN) one by mouth every 6 hours as needed pain  #20 x 0   Entered and Authorized by:   Leanna Battles. Dixon Boos   Signed by:   Leanna Battles Lewis PA-C on 07/16/2010   Method used:   Print then Give to Patient   RxID:   [id number]    PATIENT WILL HAVE TO PICK UP RX. SHE SHOULD NOT USE THIS WITH THE VICODIN.  Appended Document: pt request change in pain meds pt aware. Rx at front desk

## 2010-11-06 NOTE — Letter (Signed)
Summary: Appointment - Missed  [facility] [place]  [address], [place] [address]   Phone: [phone]  Fax: [phone]     March 25, 2010 MRN: [id number]   BROOKLIN RIEGER [address] [place], [place]  [address]   Dear Ms. Harju,  Our records indicate you missed your appointment on        03/25/10 Joni Reining NP      It is very important that we reach you to reschedule this appointment. We look forward to participating in your health care needs. Please contact us at the number listed above at your earliest convenience to reschedule this appointment.     Sincerely,    Glass blower/designer

## 2010-11-06 NOTE — Letter (Signed)
Summary: Appointment - Missed  [facility] [place]  [address] [place] [address]   Phone: [phone]  Fax: [phone]     October 24, 2010 MRN: [id number]   [name] [address] [place], [place]  [address]   Dear [name],  Our records indicate you missed your appointment on       10/24/10 Joni Reining NP                 It is very important that we reach you to reschedule this appointment. We look forward to participating in your health care needs. Please contact us at the number listed above at your earliest convenience to reschedule this appointment.     Sincerely,    Glass blower/designer

## 2010-11-06 NOTE — Assessment & Plan Note (Signed)
Summary: 2-3 wk f/u per checkout on 02-03-2010/tg   Visit Type:  Follow-up Primary Provider:  [facility] Department  CC:  chest pain using nitro.  History of Present Illness: [name] returns today for followup of her history of exertional chest pain evaluated by Dr. Diona [name].  She then having exertional angina for about 7 or 8 months. Stress Myoview showed an EF of 44% with inferior lateral scar and superimposed ischemia.  She had bypass surgery only in 2005 at age 89. At that time showed an ejection fraction of 55% with posterior akinesia. She also had a carotid endarterectomy.  Unfortunately, she is not taking great care result. She continues to smoke. She was almost in total denial today and shock when I told her that she had heart cath.  Current Medications (verified): 1)  Alprazolam 1 Mg Tabs (Alprazolam) .... Four Times A Day As Needed 2)  Prozac 40 Mg Caps (Fluoxetine Hcl) .... Take 1 Tablet By Mouth Once A Day 3)  Lovastatin 20 Mg Tabs (Lovastatin) .... Take One Tablet By Mouth Daily At Bedtime 4)  Plavix 75 Mg Tabs (Clopidogrel Bisulfate) .... Take One Tablet By Mouth Daily 5)  Benazepril Hcl 40 Mg Tabs (Benazepril Hcl) .... Take 1 Tablet By Mouth Once A Day 6)  Toprol Xl 50 Mg Xr24h-Tab (Metoprolol Succinate) .... Take 1 Tablet By Mouth Once A Day 7)  Trazodone Hcl 50 Mg Tabs (Trazodone Hcl) .... Take 1/2 Tab As Needed 8)  Aleve 220 Mg Tabs (Naproxen Sodium) .... As Needed 9)  Norvasc 5 Mg Tabs (Amlodipine Besylate) .... Take 1 Tablet By Mouth Once Daily 10)  Nitrostat 0.4 Mg Subl (Nitroglycerin) .Marland Kitchen.. 1 Tablet Under Tongue At Onset of Chest Pain; You May Repeat Every 5 Minutes For Up To 3 Doses.  Allergies (verified): No Known Drug Allergies  Past History:  Past Medical History: Last updated: 02-03-10 Hyperlipidemia Carotid artery disease, bilateral 50-69% ICA 9/10 Anxiety GERD COPD Hypertension Seizure disorder CAD - multivessel, LVEF 50-55% with  posterior akinesis  Past Surgical History: Last updated: Feb 03, 2010 Carotid endarectomy 2005 Left knee repair  Right shoulder and elbow repair Hysterectomy CABG 2005 - LIMA to AL, SVG to LAD, SVG to circumflex, SVG to PDA  Family History: Last updated: Feb 03, 2010 Father: died age 83 with MI Mother: died age 73 with MI  Social History: Last updated: 02-03-2010 Tobacco Use - Yes Alcohol Use - yes Regular Exercise - no Drug Use - yes (former), polysubstance abuse  Risk Factors: Exercise: no (05/27/2009)  Risk Factors: Smoking Status: current (05/27/2009)  Review of Systems       negative history of present illness  Vital Signs:  Patient profile:   55 year old female Weight:      228 pounds Pulse rate:   61 / minute BP sitting:   152 / 75  (right arm)  Vitals Entered By: Toni Saa, CNA (Feb 14, 2010 3:20 PM)  Physical Exam  General:  obese and unkept.   Head:  normocephalic and atraumatic Eyes:  PERRLA/EOM intact; conjunctiva and lids normal. Neck:  Neck supple, no JVD. No masses, thyromegaly or abnormal cervical nodes. Chest [name]:  no deformities or breast masses noted Lungs:  Clear bilaterally to auscultation and percussion. Heart:  Non-displaced PMI, chest non-tender; regular rate and rhythm, S1, S2 without murmurs, rubs or gallops. Carotid upstroke normal, no bruit. Normal abdominal aortic size, no bruits. Femorals normal pulses, no bruits. Pedals normal pulses. No edema, no varicosities. Abdomen:  Bowel  sounds positive; abdomen soft and non-tender without masses, organomegaly, or hernias noted. No hepatosplenomegaly. Msk:  Back normal, normal gait. Muscle strength and tone normal. Pulses:  pulses normal in all 4 extremities Extremities:  No clubbing or cyanosis. Neurologic:  Alert and oriented x 3. Skin:  Intact without lesions or rashes. Psych:  Normal affect.   Impression & Recommendations:  Problem # 1:  ANGINA, STABLE (ICD-413.9) Assessment  Deteriorated Her angina has been stable. She is taking more nitroglycerin now. Her stress test is positive. I suspect her graft failure to the inferior lateral [name] and perhaps progressive coronary disease as well. She has ongoing risk factors and takes very poor care of herself. I would monitor today to stop smoking once again. We'll arrange for have a heart catheterization early next week. Indication for this potential benefit had been discussed. She agrees to proceed. I've advised her to not do any manual work or significant exertion until the catheterization. Her updated medication list for this problem includes:    Plavix 75 Mg Tabs (Clopidogrel bisulfate) .Marland Kitchen... Take one tablet by mouth daily    Benazepril Hcl 40 Mg Tabs (Benazepril hcl) .Marland Kitchen... Take 1 tablet by mouth once a day    Toprol Xl 50 Mg Xr24h-tab (Metoprolol succinate) .Marland Kitchen... Take 1 tablet by mouth once a day    Norvasc 5 Mg Tabs (Amlodipine besylate) .Marland Kitchen... Take 1 tablet by mouth once daily    Nitrostat 0.4 Mg Subl (Nitroglycerin) .Marland Kitchen... 1 tablet under tongue at onset of chest pain; you may repeat every 5 minutes for up to 3 doses.  Orders: T-Comprehensive Metabolic Panel [phone]) T-CBC w/Diff [phone]) T-Protime, Auto ([id number]) T-PTT ([id number]) Cardiac Catheterization (Cardiac Cath)  Problem # 2:  CAROTID OCCLUSIVE DISEASE (ICD-433.10)  Her updated medication list for this problem includes:    Plavix 75 Mg Tabs (Clopidogrel bisulfate) .Marland Kitchen... Take one tablet by mouth daily  Problem # 3:  DYSLIPIDEMIA (ICD-272.4)  Her updated medication list for this problem includes:    Lovastatin 20 Mg Tabs (Lovastatin) .Marland Kitchen... Take one tablet by mouth daily at bedtime  Problem # 4:  COPD (ICD-496)  Problem # 5:  HYPERTENSION (ICD-401.9) Assessment: Unchanged  Her updated medication list for this problem includes:    Benazepril Hcl 40 Mg Tabs (Benazepril hcl) .Marland Kitchen... Take 1 tablet by mouth once a day    Toprol Xl 50 Mg  Xr24h-tab (Metoprolol succinate) .Marland Kitchen... Take 1 tablet by mouth once a day    Norvasc 5 Mg Tabs (Amlodipine besylate) .Marland Kitchen... Take 1 tablet by mouth once daily  Problem # 6:  CORONARY ATHEROSCLEROSIS NATIVE CORONARY ARTERY (ICD-414.01)  Her updated medication list for this problem includes:    Plavix 75 Mg Tabs (Clopidogrel bisulfate) .Marland Kitchen... Take one tablet by mouth daily    Benazepril Hcl 40 Mg Tabs (Benazepril hcl) .Marland Kitchen... Take 1 tablet by mouth once a day    Toprol Xl 50 Mg Xr24h-tab (Metoprolol succinate) .Marland Kitchen... Take 1 tablet by mouth once a day    Norvasc 5 Mg Tabs (Amlodipine besylate) .Marland Kitchen... Take 1 tablet by mouth once daily    Nitrostat 0.4 Mg Subl (Nitroglycerin) .Marland Kitchen... 1 tablet under tongue at onset of chest pain; you may repeat every 5 minutes for up to 3 doses.  Patient Instructions: 1)  Your physician recommends that you schedule a follow-up appointment in: after catherization 2)  Your physician recommends that you return for lab work MW:[id number] 3)  Your physician has requested that you have a cardiac catheterization.  Cardiac catheterization is used to diagnose and/or treat various heart conditions. Doctors may recommend this procedure for a number of different reasons. The most common reason is to evaluate chest pain. Chest pain can be a symptom of coronary artery disease (CAD), and cardiac catheterization can show whether plaque is narrowing or blocking your heart's arteries. This procedure is also used to evaluate the valves, as well as measure the blood flow and oxygen levels in different parts of your heart.  For further information please visit [email or web address]  Please follow instruction sheet, as given.

## 2010-11-06 NOTE — Progress Notes (Signed)
Summary: Having cold like symptoms  Phone Note Call from Patient Call back at Home Phone [phone]   Caller: pt Reason for Call: Talk to Nurse Summary of Call: pt SOB and chest pain has to take two nitro to get rid of pain. She can not do anything with our giving out. Initial call taken by: Faythe Ghee,  August 08, 2010 9:58 AM  Follow-up for Phone Call        Pt. states she has been having cold and flu like symptoms for several weeks and c/o SOB, CP, body aches,fatigue and weakness. She does not have PCP and states health dept. cant see her today. Due to s/s, pt. advised to call 911 and go to ER where she can be better evaluated. Follow-up by: Larita Fife Via LPN,  August 08, 2010 10:03 AM     Appended Document: Having cold like symptoms ok.

## 2010-11-06 NOTE — Letter (Signed)
Summary: Appointment Reminder  [facility]  [address]   [place], [place] [address]   Phone: [phone]  Fax: [phone]       October 14, 2009   [name] [address] [place], [place]  [address] 18-Sep-1956    Dear [name],  We have been unable to reach you by phone to schedule a follow up   appointment that was recommended for you by Dr.Fields. It is very   important that we reach you to schedule an appointment. We hope that you  allow [place] to participate in your health care needs. Please contact us at  [phone] at your earliest convenience to schedule your appointment.  Sincerely,    [facility] R. Roetta Sessions, M.D.    Kassie Mends, M.D. Lorenza Burton, FNP-BC    Tana Coast, PA-C Phone: [phone]    Fax: [phone]

## 2010-11-06 NOTE — Letter (Signed)
Summary: [facility] [employer]  [address], [place] [address]   Phone: [phone]  Fax: [phone]     September 05, 2010 MRN: [id number]   [name] [address] [place], [place]  [address]      YOUR LAB WORK IS DUE  October 20, 2010 _________________________________________  Please go to [facility], located across the street from [facility] on the second floor.  Hours are Monday - Friday 7am until 7:30pm         Saturday 8am until 12noon    _X_  DO NOT EAT OR DRINK AFTER MIDNIGHT EVENING PRIOR TO LABWORK  __ YOUR LABWORK IS NOT FASTING --YOU MAY EAT PRIOR TO LABWORK

## 2010-11-06 NOTE — Procedures (Signed)
[name], [name]               ACCOUNT NO.:  [id number]  MEDICAL RECORD NO.:  [id number]          PATIENT TYPE:  INP  LOCATION:  6523                         FACILITY:  [facility]  PHYSICIAN:  Noralyn Pick. Eden Emms, MD, FACCDATE OF BIRTH:  Nov 08, 1955  DATE OF PROCEDURE: DATE OF DISCHARGE:                           CARDIAC CATHETERIZATION   A 55 year old patient with known coronary artery disease, previous bypass surgery with known occlusion of the vein graft to the LAD, diagonal, and vein graft to the OM.  In November 2011, bare-metal stenting to the saphenous vein graft to the right coronary artery.  Cine catheterization is done from right femoral artery due to the patient's ischemic cardiomyopathy and shortness of breath.  Right heart catheterization was also performed.  A left internal mammary artery was used to cannulate the left LIMA.  Standard JR-4 catheter was used to engage the native right and the saphenous vein graft to the right. Standard JL-4 catheter was used to engage the left main.  Left main coronary was normal.  Left anterior descending artery was occluded just after the septal perforator.  First diagonal branch was subtotally occluded.  There was some filling of the distal LAD and diagonals from collateral branches from the LIMA.  Circumflex coronary artery was occluded in the midvessel.  The AV groove branch was patent.  There was a small first obtuse marginal branch with 30-40% multiple lesions.  There was a small second obtuse marginal branch with 30-40% discrete lesions.  There is a very large obtuse marginal branch seen filling from collaterals from the left internal mammary artery.  Right coronary artery was 100% occluded proximally with both left-to- right and right-to-right collaterals.  The distal PDA also filled from the vein graft to the right coronary artery.  The left internal mammary artery was widely patent.  It also filled the distal right large  obtuse marginal branch and filled retrograde to a smaller diagonal branch.  There was known occlusion of the 2 left-sided vein grafts.  The saphenous vein graft to the right coronary artery was widely patent. Bare-metal stent in the mid-to-distal vessel widely patent.  There was poor runoff to the vessel primarily being retrograde into the posterior lateral branch, however, there was no new focal stenosis.  RAO ventriculography.  RAO ventriculography showed anterior apical and mid inferior wall hypokinesis.  EF was in the 40% range.  Right heart catheterization showed mean pulmonary capillary wedge pressure of 24, PA pressure was 55/26, RV pressure was 52/5, RA pressure mean was 16, aortic pressure was 155/76, LV pressure was equal to 167/16 with a post A-wave EDP of 26.  Cardiac output was 6.3 liters per minute with a cardiac index of 3 liters per minute per meter squared by Fick.  IMPRESSION:  The patient's coronary artery disease is stable.  The stents in the RCA graft were patent.  There are no new lesions that I can see.  She does have collateralized distal right and OM branch.  Continued medical therapy is warranted.  If she continues to have chest pain, Ranexa may be in order since it is somewhat late in the  day and she had both venous and arterial sheath.  She will be kept overnight and discharged in the morning.  She tolerated the procedure well.     Noralyn Pick. Eden Emms, MD, [facility]     PCN/MEDQ  D:  10/31/2010  T:  11/01/2010  Job:  [id number]  Electronically Signed by Charlton Haws MD [facility] on 11/06/2010 10:38:08 PM

## 2010-11-06 NOTE — Progress Notes (Signed)
Summary: pt wants pain med refill  Phone Note Call from Patient Call back at Home Phone [phone]   Caller: Patient Summary of Call: pt still having pain when having gas or bowel movements. Joints hurt (pharmacist told her it could be the abx that she is on). pt c/o pain in lower left abd after standing up for a long time or cleaning, sweeping etc. pt wants a refill on her pain medication.  Initial call taken by: Hendricks Limes LPN,  July 23, 2010 11:33 AM     Appended Document: pt wants pain med refill Will refill pain med but whe will have to pick up RX. If her symptoms do not settle down she will need to let us know. She should have approx one more week of antibiotics. She should be consuming low fiber diet for now.   Prescriptions: OXYCODONE-ACETAMINOPHEN 7.5-325 MG TABS (OXYCODONE-ACETAMINOPHEN) one by mouth every 6 hours as needed pain  #20 x 0   Entered and Authorized by:   Leanna Battles. Dixon Boos   Signed by:   Leanna Battles Lewis PA-C on 07/23/2010   Method used:   Print then Give to Patient   RxID:   [id number]     Appended Document: pt wants pain med refill pt aware, rx and copy of low fiber diet ( per pt request) at front desk

## 2010-11-06 NOTE — Letter (Signed)
Summary: DISABILITY DETERMINATION  DISABILITY DETERMINATION   Imported By: Rexene Alberts 09/19/2010 14:44:07  _____________________________________________________________________  External Attachment:    Type:   Image     Comment:   External Document

## 2010-11-06 NOTE — Progress Notes (Signed)
Summary: right chest pain and SOB  Phone Note Call from Patient Call back at Home Phone [phone]   Caller: pt Reason for Call: Talk to Nurse Summary of Call: pt ssen Dr Daleen Squibb a few weeks ago and forgot to mention to him that every time she walks or does house work her rightt side of her chest hurts very bad a feels like it is on fire and get SOB. She states that she has to stop in her tracks because it hurts so bad that she thinks she is going to pass out. Initial call taken by: Faythe Ghee,  January 17, 2010 12:32 PM  Follow-up for Phone Call        [place] or [place], Call pt and if it sounds like angina, have evaluated next week by PA. Follow-up by: Gaylord Shih, MD, [facility],  January 17, 2010 2:07 PM     Appended Document: right chest pain and SOB Dr. Daleen Squibb will not be in [place] for a month, NP has vacation.  scheduled pt to see Dr. Diona Browner 01/23/2010 n1:20pm

## 2010-11-06 NOTE — Miscellaneous (Signed)
Summary: [facility] Cardiac Physician Order/Treatment Plan   [facility] Cardiac Physician Order/Treatment Plan   Imported By: Roderic Ovens 04/15/2010 14:21:51  _____________________________________________________________________  External Attachment:    Type:   Image     Comment:   External Document

## 2010-11-06 NOTE — Progress Notes (Signed)
Summary: pt having on and off racing heart then slow  Phone Note Call from Patient Call back at Home Phone [phone]   Caller: pt Reason for Call: Talk to Nurse Summary of Call: per pt call she was discharge recently from hospital with colapsed stent and mild heart attack. for the last couple days she has been having a fast heart rate then slow. Initial call taken by: Faythe Ghee,  October 03, 2010 2:46 PM  Follow-up for Phone Call        spoke with pt, will see [facility] on 10/07/10  for hr issues,  pt will go to [facility] if she has any other problems over the holiday weekend Follow-up by: Teressa Lower RN,  October 03, 2010 4:21 PM

## 2010-11-06 NOTE — Assessment & Plan Note (Signed)
Summary: ROV   Visit Type:  Follow-up Primary Provider:  Steward Drone, PA  CC:  no cardiololgy complaints.  History of Present Illness: [name] comes in today for evaluation and management of her history of coronary artery disease, history of chronic bypass grafting in 2005, normal left ventricular function, hyperlipidemia, nonobstructive carotid disease, and obesity.  She has lost 19 pounds. She says is because it is painful to eat from her diverticulitis. Regardless, and colitis she's lost weight.  Blood work recently showed mixed hyperlipidemia with triglycerides of 342. Her HDL 35. She continues on lovastatin.  She complains of pain in her back in the last for 5 days. She's now developed a rash between her breasts and along her right upper thorax. She is concerned it is shingles.  Current Medications (verified): 1)  Alprazolam 1 Mg Tabs (Alprazolam) .... Four Times A Day As Needed 2)  Prozac 40 Mg Caps (Fluoxetine Hcl) .... Take 1 Tablet By Mouth Once A Day 3)  Lovastatin 20 Mg Tabs (Lovastatin) .... Take One Tablet By Mouth Daily At Bedtime 4)  Plavix 75 Mg Tabs (Clopidogrel Bisulfate) .... Take One Tablet By Mouth Daily 5)  Benazepril Hcl 40 Mg Tabs (Benazepril Hcl) .... Take 1 Tablet By Mouth Once A Day 6)  Toprol Xl 50 Mg Xr24h-Tab (Metoprolol Succinate) .... Take 1 Tablet By Mouth Once A Day 7)  Risperdal 1 Mg Tabs (Risperidone) .... Take 1 Tab Qhs 8)  Valtrex 1 Gm Tabs (Valacyclovir Hcl) .... Take 1 Tablet Three Times A Day 9)  Aleve 220 Mg Tabs (Naproxen Sodium) .... As Needed  Allergies (verified): No Known Drug Allergies  Past History:  Past Medical History: Last updated: 05/27/2009 Current Problems:  SUBSTANCE ABUSE (ICD-305.90) DYSLIPIDEMIA (ICD-272.4) CAROTID OCCLUSIVE DISEASE (ICD-433.10) ANXIETY (ICD-300.00) GERD (ICD-530.81) COPD (ICD-496) HYPERTENSION (ICD-401.9) SEIZURE DISORDER (ICD-780.39) CAD (ICD-414.00)  Past Surgical History: Last updated:  05/27/2009 coronary artery bypass graft times 4(8/05) carotid endarectomy 8/05 left knee repair  right shoulder and elbow repair hysterectomy  Family History: Last updated: 05/27/2009 Father: Mother:  Social History: Last updated: 05/27/2009 Tobacco Use - Yes.  Alcohol Use - yes Regular Exercise - no Drug Use - yes(former)  Risk Factors: Exercise: no (05/27/2009)  Risk Factors: Smoking Status: current (05/27/2009)  Review of Systems       negative other than history of present illness  Vital Signs:  Patient profile:   55 year old female Weight:      223 pounds Pulse rate:   57 / minute BP sitting:   146 / 82  (right arm)  Vitals Entered By: Dreama Saa, CNA (December 18, 2009 12:05 PM)  Physical Exam  General:  obese.   Eyes:  PERRLA/EOM intact; conjunctiva and lids normal. Mouth:  poor dentition.   Neck:  Neck supple, no JVD. No masses, thyromegaly or abnormal cervical nodes. Chest [name]:  no deformities or breast masses noted Lungs:  Clear bilaterally to auscultation and percussion. Heart:  regular rate and rhythm, normal S1-S2, soft right carotid bruit Abdomen:  Bowel sounds positive; abdomen soft and non-tender without masses, organomegaly, or hernias noted. No hepatosplenomegaly. Msk:  Back normal, normal gait. Muscle strength and tone normal. Pulses:  pulses normal in all 4 extremities Extremities:  No clubbing or cyanosis. Neurologic:  Alert and oriented x 3. Skin:  classic rash at the level about T4 Psych:  Normal affect.   Problems:  Medical Problems Added: 1)  Dx of Chest Pain-unspecified  (ICD-786.50)  Impression &  Recommendations:  Problem # 1:  CHEST PAIN-UNSPECIFIED (ICD-786.50) Assessment New she has a four-day history of shingles. Have prescribed generic Valtrex 1 g 3 times a day for 7 days. She can take Aleve 2 tablets twice a day for pain control. Her updated medication list for this problem includes:    Plavix 75 Mg Tabs (Clopidogrel  bisulfate) .Marland Kitchen... Take one tablet by mouth daily    Benazepril Hcl 40 Mg Tabs (Benazepril hcl) .Marland Kitchen... Take 1 tablet by mouth once a day    Toprol Xl 50 Mg Xr24h-tab (Metoprolol succinate) .Marland Kitchen... Take 1 tablet by mouth once a day  Problem # 2:  CAD (ICD-414.00) Assessment: Unchanged  Her updated medication list for this problem includes:    Plavix 75 Mg Tabs (Clopidogrel bisulfate) .Marland Kitchen... Take one tablet by mouth daily    Benazepril Hcl 40 Mg Tabs (Benazepril hcl) .Marland Kitchen... Take 1 tablet by mouth once a day    Toprol Xl 50 Mg Xr24h-tab (Metoprolol succinate) .Marland Kitchen... Take 1 tablet by mouth once a day  Problem # 3:  DYSLIPIDEMIA (ICD-272.4)  Her updated medication list for this problem includes:    Lovastatin 20 Mg Tabs (Lovastatin) .Marland Kitchen... Take one tablet by mouth daily at bedtime  Problem # 4:  CAROTID OCCLUSIVE DISEASE (ICD-433.10) Assessment: Unchanged Will repeat carotids in a year Her updated medication list for this problem includes:    Plavix 75 Mg Tabs (Clopidogrel bisulfate) .Marland Kitchen... Take one tablet by mouth daily  Patient Instructions: 1)  Your physician recommends that you schedule a follow-up appointment in: 6 months 2)  Your physician has recommended you make the following change in your medication: Start taking Val trex by mouth three times a day X 7 days and Aleve 220mg  as needed for pain Prescriptions: VALTREX 1 GM TABS (VALACYCLOVIR HCL) take 1 tablet three times a day  #21 x 0   Entered by:   Larita Fife Via LPN   Authorized by:   Gaylord Shih, MD, [facility]   Signed by:   Larita Fife Via LPN on 20/25/4270   Method used:   Electronically to        [facility] (retail)       [address]       [place], [place]  [address]       Ph: [phone]       Fax: [phone]   RxID:   [id number]

## 2010-11-06 NOTE — Assessment & Plan Note (Signed)
Summary: W1X   Visit Type:  Follow-up Primary Provider:  [facility] Dept  CC:  6 mth fu .  History of Present Illness: [name] returns today for evaluation and management of her coronary disease.  She was admitted the first week of November for unstable angina. She ruled in for myocardial infarction peak troponin was 1.27 and her CPK was 193 with MB of 10.3. She underwent cardiac catheterization and had restenosis of a drug-eluting stent in the mid body of a bank graft to the PDA. It was successfully angioplastied. Two over four bypass grafts were patent as before. EF was around 35-40%.  Her Plavix was changed to Effient.   She quit smoking 2 months ago. She says she feels that she's ever fell. She denies any angina or need for nitroglycerin. She presented with classic ischemic symptoms.  Her benazepril was discontinued during hospitalization. She said her blood pressure ran a low. She's hot today. Her creatinine has been stable.  Her total cholesterol was not a goal during her hospitalization on 20 mg of lovastatin. Her total was 201 LDL was 100. HDL 41.  She's lost some weight.  Her TSH was high but her free T4 was normal.  Medications reviewed and she is compliant.   Current Medications (verified): 1)  Alprazolam 1 Mg Tabs (Alprazolam) .... Four Times A Day As Needed 2)  Prozac 20 Mg Caps (Fluoxetine Hcl) .... Take 1 Tablet By Mouth Once A Day 3)  Lovastatin 40 Mg Tabs (Lovastatin) .... Take 2 Tablets By Mouth At Bedtime 4)  Trazodone Hcl 50 Mg Tabs (Trazodone Hcl) .... Take 1/2 To 1 Tab As Needed 5)  Aleve 220 Mg Tabs (Naproxen Sodium) .... As Needed 6)  Nitrostat 0.4 Mg Subl (Nitroglycerin) .Marland Kitchen.. 1 Tablet Under Tongue At Onset of Chest Pain; You May Repeat Every 5 Minutes For Up To 3 Doses. 7)  Nexium 40 Mg Cpdr (Esomeprazole Magnesium) .... Take 1 Tablet By Mouth Once A Day 8)  Effient 10 Mg Tabs (Prasugrel Hcl) .... Take 1 Tab Daily 9)  Metoprolol Tartrate 25 Mg  Tabs (Metoprolol Tartrate) .... Take 1/2 Tab Two Times A Day 10)  Synthroid 50 Mcg Tabs (Levothyroxine Sodium) .... Take 1 Tab Daily 11)  Aspirin 325 Mg Tabs (Aspirin) .... Take 1 Tab Daily 12)  Ultracet 37.5-325 Mg Tabs (Tramadol-Acetaminophen) .... Take As Directed For Pain 13)  Benazepril Hcl 20 Mg Tabs (Benazepril Hcl) .... Take 1 Tablet By Mouth Every Morning.  Allergies (verified): No Known Drug Allergies  Past History:  Past Medical History: Last updated: 2010-08-01 Hyperlipidemia Carotid artery disease, bilateral 50-69% ICA 9/10 Coronary artery stents, 5/11 Anxiety/Depression GERD COPD Hypertension Seizure disorder CAD - multivessel, LVEF 50-55% with posterior akinesis Kidney stones H/O diverticulitis Chronic low back pain  Past Surgical History: Last updated: 01/23/2010 Carotid endarectomy 2005 Left knee repair  Right shoulder and elbow repair Hysterectomy CABG 2005 - LIMA to AL, SVG to LAD, SVG to circumflex, SVG to PDA  Family History: Last updated: 2010/08/01 Father: died age 75 with MI Mother: died age 22 with MI, metastatic breast cancer No FH of CRC.  Social History: Last updated: 2010/08/01 Tobacco Use - Yes Regular Exercise - no Drug Use - yes (former), polysubstance abuse Married. No alcohol in 2 years.    Risk Factors: Exercise: no (05/27/2009)  Risk Factors: Smoking Status: current (05/27/2009)  Review of Systems       negative other than history of present illness  Vital Signs:  Patient profile:   55 year old female Weight:      243 pounds BMI:     41.86 Pulse rate:   62 / minute BP sitting:   144 / 92  (right arm)  Vitals Entered By: Toni Saa, CNA (September 05, 2010 11:07 AM)  Physical Exam  General:  obese.  no acute distress Head:  normocephalic and atraumatic Eyes:  PERRLA/EOM intact; conjunctiva and lids normal. Neck:  Neck supple, no JVD. No masses, thyromegaly or abnormal cervical nodes. Chest Wall:  no  deformities or breast masses noted Lungs:  Clear bilaterally to auscultation and percussion. Heart:  PMI poorly appreciated, normal S1-S2, no gallop or murmur. Carotids full without bruit Msk:  Back normal, normal gait. Muscle strength and tone normal. Pulses:  pulses normal in all 4 extremities Extremities:  No clubbing or cyanosis. Neurologic:  Alert and oriented x 3. Skin:  Intact without lesions or rashes. Psych:  Normal affect.   EKG  Procedure date:  09/05/2010  Findings:      sinus bradycardia with a short PR interval, RSR prime, ST segment changes laterally, no acute changes.  Impression & Recommendations:  Problem # 1:  CORONARY ATHEROSCLEROSIS NATIVE CORONARY ARTERY (ICD-414.01) She is asymptomatic. It appears that her angioplasty of her vein graft has been successful. She will continue to carry submental nitroglycerin. With her LV dysfunction I am adding back benazepril 20 mg per day. Have also increased her lovastatin to 80 mg per day. Followup blood work in 4-6 weeks. I will see her back for close followup in 3 months. The following medications were removed from the medication list:    Plavix 75 Mg Tabs (Clopidogrel bisulfate) .Marland Kitchen... Take one tablet by mouth daily    Benazepril Hcl 40 Mg Tabs (Benazepril hcl) .Marland Kitchen... Take 1 tablet by mouth once a day    Toprol Xl 50 Mg Xr24h-tab (Metoprolol succinate) .Marland Kitchen... Take 1 tablet by mouth once a day    Norvasc 5 Mg Tabs (Amlodipine besylate) .Marland Kitchen... Take 1 tablet by mouth once daily    Aspirin 325 Mg Tabs (Aspirin) ..... One by mouth daily Her updated medication list for this problem includes:    Nitrostat 0.4 Mg Subl (Nitroglycerin) .Marland Kitchen... 1 tablet under tongue at onset of chest pain; you may repeat every 5 minutes for up to 3 doses.    Effient 10 Mg Tabs (Prasugrel hcl) .Marland Kitchen... Take 1 tab daily    Metoprolol Tartrate 25 Mg Tabs (Metoprolol tartrate) .Marland Kitchen... Take 1/2 tab two times a day    Aspirin 325 Mg Tabs (Aspirin) .Marland Kitchen... Take 1 tab  daily    Benazepril Hcl 20 Mg Tabs (Benazepril hcl) .Marland Kitchen... Take 1 tablet by mouth every morning.  Other Orders: Future Orders: T-Lipid Profile ([id number]) ... 10/20/2010 T-Hepatic Function [id number]) ... 10/20/2010  Patient Instructions: 1)  Your physician recommends that you schedule a follow-up appointment in: 3 months 2)  Your physician recommends that you return for lab work in: 6 weeks 3)  Your physician has recommended you make the following change in your medication: Start taking Benazepril 20mg  every morning and increase Lovastatin to 80mg  at bedtime  4)  **Congratulations on quitting smoking***  Prescriptions: LOVASTATIN 40 MG TABS (LOVASTATIN) take 2 tablets by mouth at bedtime  #60 x 6   Entered by:   Larita Fife Via LPN   Authorized by:   Gaylord Shih, MD, [facility]   Signed by:   Larita Fife Via LPN on 29/56/2130   Method used:  Electronically to        [facility] (retail)       [address]       [place], [place]  [address]       Ph: [phone]       Fax: [phone]   RxID:   [id number] BENAZEPRIL HCL 20 MG TABS (BENAZEPRIL HCL) take 1 tablet by mouth every morning.  #30 x 6   Entered by:   Larita Fife Via LPN   Authorized by:   Gaylord Shih, MD, [facility]   Signed by:   Larita Fife Via LPN on 74/25/9563   Method used:   Electronically to        [facility] (retail)       [address]       [place], [place]  [address]       Ph: [phone]       Fax: [phone]   RxID:   [id number]

## 2010-11-06 NOTE — Progress Notes (Addendum)
**Note De-Identified Kamiyah Kindel Obfuscation** Summary: CP  Phone Note Call from Patient   Caller: Patient Details for Reason: CP Summary of Call: Pt. states she had to take 3 NTG tablets yesterday for CP which was effective. She states she has taken 3 NTG tablets today and continues to have CP. Pt. is very upset (crying) and nervous on phone due to CP. Pt. advised to call 911 and go straight to ER. She states she understands instructions given. Head charge nurse in ER, Wilkie Aye, notified that pt. should be on her way to ER with c/o active CP. Initial call taken by: Larita Fife Ryver Poblete LPN,  October 30, 2010 2:56 PM

## 2010-11-06 NOTE — Letter (Signed)
Summary: [facility] (Nuc Med Stress)  [facility]  [address] [place] [address]   Phone: [phone]  Fax: [phone]    Nuclear Medicine 1-Day Stress Test Information Sheet  Re:     [name]   DOB:     [birth year] MRN:     [id number] Weight:  Appointment Date: Register at: Appointment Time: Referring MD:  ___Exercise Stress  __Adenosine   __Dobutamine  _X_Lexiscan  __Persantine   __Thallium  Urgency: ____1 (next day)   ____2 (one week)    ____3 (PRN)  Patient will receive Follow Up call with results: Patient needs follow-up appointment:  Instructions regarding medication:  How to prepare for your stress test: 1. DO NOT eat or dring 6 hours prior to your arrival time. This includes no caffeine (coffee, tea, sodas, chocolate) if you were instructed to take your medications, drink water with it. 2. DO NOT use any tobacco products for at leaset 8 hours prior to arrival. 3. DO NOT wear dresses or any clothing that may have metal clasps or buttons. 4. Wear short sleeve shirts, loose clothing, and comfortalbe walking shoes. 5. DO NOT use lotions, oils or powder on your chest before the test. 6. The test will take approximately 3-4 hours from the time you arrive until completion. 7. To register the day of the test, go to the Short Stay entrance at [facility]. 8. If you must cancel your test, call [phone] as soon as you are aware.  After you arrive for test:   When you arrive at [facility], you will go to Short Stay to be registered. They will then send you to Radiology to check in. The Nuclear Medicine Tech will get you and start an IV in your arm or hand. A small amount of a radioactive tracer will then be injected into your IV. This tracer will then have to circulate for 30-45 minutes. During this time you will wait in the waiting room and you will be able to drink something without caffeine. A series of pictures will be taken  of your heart follwoing this waiting period. After the 1st set of pictures you will go to the stress lab to get ready for your stress test. During the stress test, another small amount of a radioactive tracer will be injected through your IV. When the stress test is complete, there is a short rest period while your heart rate and blood pressure will be monitored. When this monitoring period is complete you will have another set of pictrues taken. (The same as the 1st set of pictures). These pictures are taken between 15 minutes and 1 hour after the stress test. The time depends on the type of stress test you had. Your doctor will inform you of your test results within 7 days after test.    The possibilities of certain changes are possible during the test. They include abnormal blood pressure and disorders of the heart. Side effects of persantine or adenosine can include flushing, chest pain, shortness of breath, stomach tightness, headache and light-headedness. These side effects usually do not last long and are self-resolving. Every effort will be made to keep you comfortable and to minimize complications by obtaining a medical history and by close observation during the test. Emergency equipment, medications, and trained personnel are available to deal with any unusual situation which may arise.  Please notify office at least 48 hours in advance if you are unable to keep  this appt.

## 2010-11-06 NOTE — Letter (Signed)
Summary: [place] [facility] Work Engineer, agricultural at [facility]  [address], [place] [address]   Phone: [phone]  Fax: [phone]     July 04, 2010 MRN: [id number]   [name] [address] [place], [place]  [address]      YOUR LAB WORK IS DUE  July 07, 2010 _________________________________________  Please go to [facility], located across the street from [facility] on the second floor.  Hours are Monday - Friday 7am until 7:30pm         Saturday 8am until 12noon    _X_  DO NOT EAT OR DRINK AFTER MIDNIGHT EVENING PRIOR TO LABWORK  __ YOUR LABWORK IS NOT FASTING --YOU MAY EAT PRIOR TO LABWORK

## 2010-11-06 NOTE — Progress Notes (Signed)
Summary: pt had to take a nitro yesterday  Phone Note Call from Patient Call back at Home Phone [phone]   Caller: pt Reason for Call: Talk to Nurse Summary of Call: pt had to take a nitro yesterday after starting to walk for about 10 min her chest was hurting, she has not felt that way since last hospital viist. Initial call taken by: Faythe Ghee,  September 19, 2010 12:49 PM  Follow-up for Phone Call        pt became short of breath, while walking, left nitro at [facility] by home and took nitro rated " 6" scared pt,  nitro relieved pain.  no further chest pain since that episode. walked since this spell but not as far Follow-up by: Teressa Lower RN,  September 19, 2010 4:35 PM

## 2010-11-06 NOTE — Progress Notes (Signed)
**Note De-Identified Renn Dirocco Obfuscation** Summary: lab orders  Phone Note Call from Patient Call back at Home Phone [phone]   Caller: pt Reason for Call: Talk to Nurse Summary of Call: S: pt was told she need more labs done before stent placment on monday but was never givin a order. Initial call taken by: Faythe Ghee,  Feb 20, 2010 11:37 AM  Follow-up for Phone Call        Pt. states that Dr. Juanda Chance is performing a stent placement on her Monday and she needs lab work before procedure. She says she called Dr. Regino Schultze office and was told that they would get in touch with this office to order labs. We have not been notified as of yet.  Follow-up by: Larita Fife Jere Bostrom LPN,  Feb 20, 2010 11:50 AM  Additional Follow-up for Phone Call Additional follow up Details #1::        We received call from [facility] lab wanting DX for BMP. Pt. is at lab at this time. Call transferred to [facility] office, for Dr. Regino Schultze nurse Additional Follow-up by: Larita Fife Arnell Slivinski LPN,  Feb 20, 2010 3:24 PM

## 2010-11-06 NOTE — Assessment & Plan Note (Signed)
Summary: NEXT AVAILABLE W/EXT PER RMR/SS   Visit Type:  Initial Consult Primary Care [name]:  [facility]  Chief Complaint:  abdominal pain.  History of Present Illness: [name] is a pleasant 55 y/o WF, patient of [facility], who was asked to come in for evaluation after she called last week with c/o abd pain.  Ten day h/o abd pain, LLQ to suprapubic region. Difficult to stand up due to pain. BM painful, made abd pain worse. Passing gas painful. More frequent stools, at first solid and then softner. Would also have intermittent hard stool. Three days, without BM, took Mag Citrate. Drank 1/2 bottle. Once bowels moved, pain lessened but still present. Also with chronic pp diarrhea with fecal urgency for several years. No melena, brbpr. Mucous in stools at times. C/O fever/chills with last fever, on Thursday. Feels little better last couple of days but still with intermittent sharp pain in LLQ. In beginning, couple episodes of N/V.   Current Medications (verified): 1)  Alprazolam 1 Mg Tabs (Alprazolam) .... Four Times A Day As Needed 2)  Prozac 20 Mg Caps (Fluoxetine Hcl) .... Take 1 Tablet By Mouth Once A Day 3)  Lovastatin 20 Mg Tabs (Lovastatin) .... Take One Tablet By Mouth Daily At Bedtime 4)  Plavix 75 Mg Tabs (Clopidogrel Bisulfate) .... Take One Tablet By Mouth Daily 5)  Benazepril Hcl 40 Mg Tabs (Benazepril Hcl) .... Take 1 Tablet By Mouth Once A Day 6)  Toprol Xl 50 Mg Xr24h-Tab (Metoprolol Succinate) .... Take 1 Tablet By Mouth Once A Day 7)  Trazodone Hcl 100 Mg Tabs (Trazodone Hcl) .... Take 1/2 Tab As Needed 8)  Aleve 220 Mg Tabs (Naproxen Sodium) .... As Needed 9)  Norvasc 5 Mg Tabs (Amlodipine Besylate) .... Take 1 Tablet By Mouth Once Daily 10)  Nitrostat 0.4 Mg Subl (Nitroglycerin) .Marland Kitchen.. 1 Tablet Under Tongue At Onset of Chest Pain; You May Repeat Every 5 Minutes For Up To 3 Doses. 11)  Nexium 40 Mg Cpdr (Esomeprazole Magnesium) .... Take 1 Tablet By Mouth Once A  Day 12)  Aspirin 325 Mg Tabs (Aspirin) .... 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  Social History: Tobacco Use - Yes Regular Exercise - no Drug Use - yes (former), polysubstance abuse Married. No alcohol in 2 years.    Review of Systems      See HPI  Vital Signs:  Patient profile:   55 year old female Height:      64 inches Weight:      256 pounds BMI:     44.10 Temp:     98.2 degrees F oral Pulse rate:   80 / minute BP sitting:   158 / 78  (left arm) Cuff size:   large  Vitals Entered By: Cloria Spring LPN (July 14, 2010 11:06 AM)  Physical Exam  General:  Well developed, well nourished, no acute distress.obese.  Head:  Normocephalic and atraumatic. Eyes:  sclera nonicteric Mouth:  op moist Lungs:  Clear throughout to auscultation. Heart:  Regular rate and rhythm; no murmurs, rubs,  or bruits. Abdomen:  Obese. Positive BS. Soft. Moderate LLQ tenderness to deep palpation. No rebound. Positive guarding. No abd bruit or hernia.  Extremities:  No clubbing, cyanosis, edema or deformities noted. Neurologic:  Alert and  oriented x4;  grossly normal neurologically. Skin:  Intact without significant lesions or rashes. Psych:  Alert and cooperative. Normal mood and affect.  Impression & Recommendations:  Problem # 1:   DIVERTICULITIS OF COLON (ICD-562.11)  Suspect diverticulitis based on symptoms. Patient also with chronic pp loose stools and intermittent abd pain which may be secondary to IBS. She has never had TCS. Discussed options of CT vs empirical tx for diverticulitis. Agreed to CT A/P with IV/oral contrast to r/o complicating factors. Once acute symptoms have resolved, will plan for TCS. She had stents placed in 5/11 and is on Plavix/ASA which will need to be addressed prior to TCS.   Orders: Est. Patient Level III ([address])  Problem # 2:  DIARRHEA, CHRONIC (ICD-787.91) see above Orders: Est. Patient Level III ([address]) Prescriptions: HYDROCODONE-ACETAMINOPHEN 5-500 MG TABS (HYDROCODONE-ACETAMINOPHEN) one by mouth every 4-6 hours as needed pain  #20 x 0   Entered and Authorized by:   Leanna Battles. Dixon Boos   Signed by:   Leanna Battles Lewis PA-C on 07/14/2010   Method used:   Print then Give to Patient   RxID:   [id number]

## 2010-11-06 NOTE — Progress Notes (Signed)
Summary: diverticulitis flare  Phone Note Call from Patient Call back at Home Phone [phone]   Caller: Patient Summary of Call: pt called- stated she was having a "flare" of diverticulitis that is worse this time than it was last time. She stated it felt like labor pains that started with constipation and now she has had diarrhea for 2 days. She also stated that she has been on a clear liquid diet and taking tylenol. She stated before this happened she was on a high fiber diet , no red meat, no sugar and drinking alot of water.. pt said she has no insurance and no money and wants to know if there is anything she can do to help get over this "flare up". please advise.  This is an SLF pt.  Initial call taken by: Hendricks Limes LPN,  October 10, 2009 11:53 AM     Appended Document: diverticulitis flare no diverticulitis on 10/10 CT.  If no fever, nausea  or vomiting, clear liquids x next couple of days; to the ED if sicker; o/w [place] next available  Appended Document: diverticulitis flare pt aware, asked for diverticulitis info to be mailed to her. Info put in mail. Will go to ER if gets worse. Gave pt Kathie Rhodes Ratliff's phone number so she could see if she could get help with hosp bills since she has no insurance.   Appended Document: diverticulitis flare tried to call pt, number out of service. mailed letter.

## 2010-11-06 NOTE — Progress Notes (Signed)
Summary: phone note/ ??pt with diverticulitis  Phone Note Call from Patient   Caller: Patient Summary of Call: Pt called and said she is having some abd pain, hx of diverticulitis. Last seen by Dr. Jena Gauss in 04/2009 inpatient. She said she  had several days with very bad pain.[facility] and [facility] and she didn't want to go to the  hospital and run up a bill. I told her she will need appt to get advice. She said she took a laxative, had a BM and the  symptoms are much better today.  Initial call taken by: Cloria Spring LPN,  July 09, 2010 1:37 PM     Appended Document: phone note/ ??pt with diverticulitis With no other information, I recommend next available ov w extender  Appended Document: phone note/ ??pt with diverticulitis Informed pt that Darl Pikes will be calling to schedule.  Appended Document: phone note/ ??pt with diverticulitis pt is aware of appt for 10/10 @ 11 w/LSL

## 2010-11-06 NOTE — Letter (Signed)
Summary: [place] Results Engineer, agricultural at [facility]  [address] [place] [address]   Phone: [phone]  Fax: [phone]      Feb 17, 2010 MRN: [id number]   [name] [address] [place], [place]  [address]   Dear Ms. Mule,  Your test ordered by Selena Batten has been reviewed by your physician (or physician assistant) and was found to be normal or stable. Your physician (or physician assistant) felt no changes were needed at this time.  ____ Echocardiogram  ____ Cardiac Stress Test  __x__ Lab Work  ____ Peripheral vascular study of arms, legs or neck  ____ CT scan or X-ray  ____ Lung or Breathing test  ____ Other:  No change in medical treatment at this time, per Dr. Daleen Squibb.  Enclosed is a copy of your labwork for your records.    Thank you, Harlie Ragle Allyne Gee RN    Olin Bing, MD, Lenise Arena.C.Gaylord Shih, MD, F.A.C.C Lewayne Bunting, MD, F.A.C.C Nona Dell, MD, F.A.C.C Charlton Haws, MD, Lenise Arena.C.C

## 2010-11-06 NOTE — Assessment & Plan Note (Signed)
Summary: pt having problems w/Angina/add to schedule per Dr.Wall/tg   Visit Type:  Follow-up Primary Provider:  [name]   History of Present Illness: 55 year old woman, patient of Dr. Daleen [name], added on to the schedule today for further assessment. She states that when last seen by Dr. Daleen [name], she neglected to mention that she has been experiencing exertional chest discomfort and shortness of breath for the last 8-9 months. Activities such as sweeping, mopping, or carrying wood bring on a right-sided chest "burning sensation" sometimes radiates to the arm, and is better with several minutes of rest. Symptoms are worse when working in the cold. She seems to indicate that the symptoms are worse since having an episode of "pneumonia" back in July.  In reviewing the medical record, she underwent coronary artery bypass grafting in 2005. She has not undergone ischemic testing since that time. She reports compliance with her medications which are outlined below. She does not have any sublabel nitroglycerin at home.  She also describes problems with headaches over the last several weeks. She has not been seen at the Health Department as yet for this.  Current Medications (verified): 1)  Alprazolam 1 Mg Tabs (Alprazolam) .... Four Times A Day As Needed 2)  Prozac 40 Mg Caps (Fluoxetine Hcl) .... Take 1 Tablet By Mouth Once A Day 3)  Lovastatin 20 Mg Tabs (Lovastatin) .... Take One Tablet By Mouth Daily At Bedtime 4)  Plavix 75 Mg Tabs (Clopidogrel Bisulfate) .... Take One Tablet By Mouth Daily 5)  Benazepril Hcl 40 Mg Tabs (Benazepril Hcl) .... Take 1 Tablet By Mouth Once A Day 6)  Toprol Xl 50 Mg Xr24h-Tab (Metoprolol Succinate) .... Take 1 Tablet By Mouth Once A Day 7)  Trazodone Hcl 50 Mg Tabs (Trazodone Hcl) .... Take 1/2 Tab As Needed 8)  Aleve 220 Mg Tabs (Naproxen Sodium) .... As Needed 9)  Norvasc 5 Mg Tabs (Amlodipine Besylate) .... Take 1 Tablet By Mouth Once Daily 10)   Nitrostat 0.4 Mg Subl (Nitroglycerin) .Marland Kitchen.. 1 Tablet Under Tongue At Onset of Chest Pain; You May Repeat Every 5 Minutes For Up To 3 Doses.  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 Left ventricle: The cavity size was mildly dilated. Wall      thickness was increased in a pattern of mild LVH. Systolic      function was low normal. The estimated ejection fraction was in      the range of 50% to 55%. Moderate hypokinesis of the a small      segment of the basilar and mid-inferolateral myocardium.   2. Mitral valve: Mildly calcified annulus.   3. Left atrium: The atrium was mildly dilated.   4. Pericardium, extracardiac: There was a very small right pleural      effusion. (04/23/2009)    Family History: Father: died age 24 with MI Mother: died age 37 with MI  Social History: Tobacco  Use - Yes Alcohol Use - yes Regular Exercise - no Drug Use - yes (former), polysubstance abuse  Review of Systems       The patient complains of weight gain, chest pain, dyspnea on exertion, and headaches.  The patient denies hoarseness, syncope, peripheral edema, prolonged cough, hemoptysis, melena, hematochezia, and severe indigestion/heartburn.         She reports being less active. Appetite stable. Otherwise reviewed and negative except as outlined.  Vital  Signs:  Patient profile:   55 year old female Weight:      232 pounds Pulse rate:   69 / minute BP sitting:   179 / 96  (right arm)  Vitals Entered By: Dreama Saa, CNA (January 23, 2010 1:15 PM)  Physical Exam  Additional Exam:  Obese woman in no acute distress. HEENT: Conjunctiva and lids normal, oropharynx was poor dentition. Neck: Supple, soft carotid bruits, no thyromegaly. Lungs: Diminished breath sounds, nonlabored. Cardiac: Regular rate and rhythm, no S3 or pericardial rub. Abdomen: Obese, nontender, bowel sounds present. Extremities: Trace ankle edema, nonpitting, distal pulses 1-2+. Skin: Warm and dry. Musculoskeletal: No kyphosis. Neuropsychiatric: Alert and oriented x3, no gross motor deficits, no nystagmus, affect generally appropriate.   EKG  Procedure date:  01/23/2010  Findings:      Sinus rhythm at 65 beats per minutes with R primes in leads V1 and V2, nonspecific ST-T changes.  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 She is 6 years out from revascularization surgery. Electrocardiogram is abnormal, although nonspecific overall. She reports compliance with her medications. She has gained weight over time, and blood pressure is not well controlled today. We discussed the potential that she has developed progressive atherosclerosis, potentially graft disease over time. We also discussed avenues for further ischemic evaluation. At this point she is most comfortable with a followup stress test, and we will therefore schedule a Lexiscan Myoview on medical therapy, which will include the addition of Norvasc 5 mg daily, to address both blood pressure and act as an anti-anginal. She will then have follow up in the office over the next few weeks with Dr. Daleen [name] to determine the next step. A prescription for sublingual nitroglycerin was also given.  Her updated  medication list for this problem includes:    Plavix 75 Mg Tabs (Clopidogrel bisulfate) .Marland Kitchen... Take one tablet by mouth daily    Benazepril Hcl 40 Mg Tabs (Benazepril hcl) .Marland Kitchen... Take 1 tablet by mouth once a day    Toprol Xl 50 Mg Xr24h-tab (Metoprolol succinate) .Marland Kitchen... Take 1 tablet by mouth once a day    Norvasc 5 Mg Tabs (Amlodipine besylate) .Marland Kitchen... Take 1 tablet by mouth once daily    Nitrostat 0.4 Mg Subl (Nitroglycerin) .Marland Kitchen... 1 tablet under tongue at onset of chest pain; you may repeat every 5 minutes for up to 3 doses.  Problem # 2:  CORONARY ATHEROSCLEROSIS NATIVE CORONARY ARTERY (ICD-414.01)  Multivessel CAD status post CABG in 2005 as detailed above, LVEF 50-55% with posterior akinesis based on prior testing.  Her updated medication list for this problem includes:    Plavix 75 Mg Tabs (Clopidogrel bisulfate) .Marland Kitchen... Take one tablet by mouth daily    Benazepril Hcl 40 Mg Tabs (Benazepril hcl) .Marland Kitchen... Take 1 tablet by mouth once a day    Toprol Xl 50 Mg Xr24h-tab (Metoprolol succinate) .Marland Kitchen... Take 1 tablet by mouth once a day  Norvasc 5 Mg Tabs (Amlodipine besylate) .Marland Kitchen... Take 1 tablet by mouth once daily    Nitrostat 0.4 Mg Subl (Nitroglycerin) .Marland Kitchen... 1 tablet under tongue at onset of chest pain; you may repeat every 5 minutes for up to 3 doses.  Problem # 3:  HEADACHE (ICD-784.0)  Nursing is to arrange a followup visit at the health department for [name], for further evaluation of this problem.  Her updated medication list for this problem includes:    Toprol Xl 50 Mg Xr24h-tab (Metoprolol succinate) .Marland Kitchen... Take 1 tablet by mouth once a day    Aleve 220 Mg Tabs (Naproxen sodium) .Marland Kitchen... As needed  Orders: Misc. Referral (Misc. Ref)  Problem # 4:  HYPERTENSION (ICD-401.9)  Not well controlled today. Norvasc is being added as noted above.  Her updated medication list for this problem includes:    Benazepril Hcl 40 Mg Tabs (Benazepril hcl) .Marland Kitchen... Take 1 tablet by mouth once a day     Toprol Xl 50 Mg Xr24h-tab (Metoprolol succinate) .Marland Kitchen... Take 1 tablet by mouth once a day    Norvasc 5 Mg Tabs (Amlodipine besylate) .Marland Kitchen... Take 1 tablet by mouth once daily  Orders: Nuclear Stress Test (Nuc Stress Test)  Her updated medication list for this problem includes:    Benazepril Hcl 40 Mg Tabs (Benazepril hcl) .Marland Kitchen... Take 1 tablet by mouth once a day    Toprol Xl 50 Mg Xr24h-tab (Metoprolol succinate) .Marland Kitchen... Take 1 tablet by mouth once a day    Norvasc 5 Mg Tabs (Amlodipine besylate) .Marland Kitchen... Take 1 tablet by mouth once daily  Problem # 5:  CAROTID OCCLUSIVE DISEASE (ICD-433.10)  Moderate bilateral disease based on carotid duplex from September 2010, status post CEA 2005.  Her updated medication list for this problem includes:    Plavix 75 Mg Tabs (Clopidogrel bisulfate) .Marland Kitchen... Take one tablet by mouth daily  Patient Instructions: 1)  Your physician recommends that you schedule a follow-up appointment in: 2 to 3 weeks with Dr. [facility]en [name]. 2)  Your physician has recommended you make the following change in your medication: Start taking Norvasc (Amlodipine)5mg  by mouth once daily  3)  Your physician recommended you take 1 tablet (or 1 spray) under tongue at onset of chest pain; you may repeat every 5 minutes for up to 3 doses. If 3 or more doses are required, call 911 and proceed to the ER immediately. 4)  Your physician has requested that you have an [employer]  For further information please visit [email or web address]  Please follow instruction sheet, as given. Prescriptions: NITROSTAT 0.4 MG SUBL (NITROGLYCERIN) 1 tablet under tongue at onset of chest pain; you may repeat every 5 minutes for up to 3 doses.  #25 x 3   Entered by:   Larita Fife Via LPN   Authorized by:   Loreli Slot, MD, [facility]   Signed by:   Larita Fife Via LPN on 25/95/6387   Method used:   Electronically to        [facility] (retail)       [address]       [place], [place]  [address]       Ph: [phone]       Fax: [phone]   RxID:   [id number] NORVASC 5 MG TABS (AMLODIPINE BESYLATE) take 1 tablet by mouth once daily  #30 x 6   Entered by:   Larita Fife Via LPN   Authorized by:  Loreli Slot, MD, [facility]   Signed by:   Larita Fife Via LPN on 16/07/9603   Method used:   Electronically to        [facility] (retail)       [address]       [place], [place]  [address]       Ph: [phone]       Fax: [phone]   RxID:   [id number]

## 2010-11-28 NOTE — Discharge Summary (Signed)
NAMEARIEL, DIMITRI NO.:  [id number]  MEDICAL RECORD NO.:  [id number]          PATIENT TYPE:  INP  LOCATION:  6523                         FACILITY:  [facility]  PHYSICIAN:  Marca Ancona, MD      DATE OF BIRTH:  [birth year]  DATE OF ADMISSION:  10/30/2010 DATE OF DISCHARGE:  11/01/2010                              DISCHARGE SUMMARY   PROCEDURES: 1. Cardiac catheterization. 2. Coronary arteriogram. 3. Left internal mammary artery arteriogram. 4. Saphenous vein graft angiogram. 5. Right-sided catheterization. 6. Left ventriculogram.  TIME OF DISCHARGE:  38 minutes.  HOSPITAL COURSE:  Ms. Signor is a 55 year old female with a history of coronary artery disease.  She came to the hospital with chest pain and was admitted for further evaluation and treatment.  Her cardiac enzymes were negative for MI.  Cardiac catheterization was performed to further define her anatomy.  The cardiac catheterization showed severe native three-vessel disease with the LAD, circumflex, and RCA, all totalled.  The SVG to OM and the SVG to diagonal and LAD were totalled from previous cath films and unchanged.  The SVG to RCA was patent with no restenosis noted in the bare-metal stent and the mid and distal vessel.  The LIMA was patent and filled a distal OM branch with retrograde filling to a smaller diagonal branch.  Her EF was 40%.  Right heart cath showed the PA pressures of 55/26 with a right atrial mean pressure 16.  Dr. Eden Emms felt the coronary artery disease was stable, and she had collateral flow to the distal right and OM branch. Continued medical therapy was warranted.  On November 01, 2010, Ms. Simcoe was feeling better.  Her groin had minimal ecchymosis, but no bruit or hematoma.  She was mildly anemic with a hemoglobin of 10.8 and hematocrit of 34.3.  This is not a new finding, and she is encouraged to follow up with her family physician.  She has a history of iron deficiency  and will be encouraged to take a multivitamin with iron.  Thyroid function studies were performed this admission, and her TSH was initially elevated at 48.288.  In July 2010, her TSH was 28; and then in November 2011, her TSH was 10.7.  In November 2011, she had been started on Synthroid.  This was continued.  Her free T3 was within normal limits at 2.4.  Her free T4 was slightly low at 0.52 and then a repeat test showed 0.62.  A repeat TSH was 16.793.  She is encouraged to follow up with her family physician.  A total cholesterol was 169 with triglycerides 358, HDL 42, LDL 55.  Hemoglobin A1c was not done this admission and in November 2011 had been 6.1.  She is encouraged to stick to a low carbohydrate diet and follow up with her family physician.  Ms. Reinhold is considered stable for discharge on November 01, 2010.  PRIMARY FINAL DISCHARGE DIAGNOSIS:  Anginal pain, medical therapy recommended.  SECONDARY DIAGNOSES: 1. Status post aortocoronary bypass surgery in 2005 with left internal     mammary artery to diagonal, saphenous vein graft to left anterior  descending, saphenous vein graft to circumflex, saphenous vein     graft to posterior descending artery. 2. Status post stenting to the saphenous vein graft to posterior     descending artery with a bare-metal stent. 3. Status post drug-eluting stent to the saphenous vein graft to     posterior descending artery in November 2011 secondary to non-ST     segment elevation myocardial infarction. 4. Ischemic cardiomyopathy with an ejection fraction of 40% on     catheterization this admission. 5. Hyperlipidemia. 6. Gastroesophageal reflux disease. 7. Tobacco abuse. 8. Chronic obstructive pulmonary disease. 9. Anxiety. 10.Cerebrovascular disease with bilateral carotid artery 50-69% in     2011. 11.Remote history of seizure disorder. 12.History of diverticulitis and diverticulosis. 13.History of sinus bradycardia. 14.History of  nonsustained ventricular tachycardia. 15.Hypothyroidism. 16.Family history of coronary artery disease. 17.Obesity.  DISCHARGE INSTRUCTIONS: 1. Her activity level is to be increased gradually. 2. She is encouraged to stick to a low-sodium heart-healthy low-     carbohydrate diet. 3. She is not to use tobacco. 4. She is to follow up with Dr. Daleen Squibb, and we will contact her with an     appointment. 5. She is to follow up with [facility] as     needed or as scheduled.  DISCHARGE MEDICATIONS: 1. Lotensin HCT 20/12.5 daily. 2. Discontinue benazepril 20 mg daily. 3. Carbamazepine 200 mg b.i.d. 4. Prozac 20 mg daily. 5. Trazodone 150 mg nightly p.r.n. 6. Effient 10 mg daily. 7. Xanax 1 mg q.i.d. p.r.n. 8. Lovastatin 20 mg 4 tablets nightly. 9. Sublingual nitroglycerin p.r.n. 10.Aleve 220 mg b.i.d. p.r.n. 11.Aspirin 325 mg daily. 12.Nexium 40 mg daily. 13.Synthroid 50 mcg daily. 14.Nu-Iron 150 mg b.i.d. 15.Toprol-XL 50 mg daily. 16.Lopressor 25 mg 1/2 tablet b.i.d. is discontinued.  Please change benazepril and hydrochlorothiazide 20/12.5 to benazepril 20 mg p.o. daily.     Theodore Demark, PA-C   ______________________________ Marca Ancona, MD    RB/MEDQ  D:  11/01/2010  T:  11/02/2010  Job:  [id number]  cc:   [facility]  Electronically Signed by Theodore Demark PA-C on 11/21/2010 11:58:15 AM Electronically Signed by Marca Ancona MD on 11/28/2010 10:33:50 AM

## 2010-12-12 ENCOUNTER — Telehealth (INDEPENDENT_AMBULATORY_CARE_PROVIDER_SITE_OTHER): Payer: Self-pay

## 2010-12-12 ENCOUNTER — Ambulatory Visit (INDEPENDENT_AMBULATORY_CARE_PROVIDER_SITE_OTHER): Payer: Medicaid Other | Admitting: Cardiology

## 2010-12-12 ENCOUNTER — Encounter: Payer: Self-pay | Admitting: Cardiology

## 2010-12-12 DIAGNOSIS — I2510 Atherosclerotic heart disease of native coronary artery without angina pectoris: Secondary | ICD-10-CM

## 2010-12-12 DIAGNOSIS — I252 Old myocardial infarction: Secondary | ICD-10-CM

## 2010-12-15 LAB — DIFFERENTIAL
Basophils Relative: 1 % (ref 0–1)
Eosinophils Absolute: 0 10*3/uL (ref 0.0–0.7)
Eosinophils Relative: 0 % (ref 0–5)
Lymphs Abs: 2.4 10*3/uL (ref 0.7–4.0)
Monocytes Relative: 9 % (ref 3–12)
Neutrophils Relative %: 61 % (ref 43–77)

## 2010-12-15 LAB — COMPREHENSIVE METABOLIC PANEL
ALT: 14 U/L (ref 0–35)
AST: 17 U/L (ref 0–37)
Alkaline Phosphatase: 60 U/L (ref 39–117)
CO2: 24 mEq/L (ref 19–32)
Calcium: 9.2 mg/dL (ref 8.4–10.5)
GFR calc Af Amer: 38 mL/min — ABNORMAL LOW (ref >60)
GFR calc non Af Amer: 31 mL/min — ABNORMAL LOW (ref >60)
Glucose, Bld: 107 mg/dL — ABNORMAL HIGH (ref 70–99)
Potassium: 4 mEq/L (ref 3.5–5.1)
Sodium: 134 mEq/L — ABNORMAL LOW (ref 135–145)
Total Protein: 7.3 g/dL (ref 6.0–8.3)

## 2010-12-15 LAB — CBC
HCT: 33.8 % — ABNORMAL LOW (ref 36.0–46.0)
Hemoglobin: 11.5 g/dL — ABNORMAL LOW (ref 12.0–15.0)
RBC: 3.81 MIL/uL — ABNORMAL LOW (ref 3.87–5.11)
WBC: 8.2 10*3/uL (ref 4.0–10.5)

## 2010-12-15 LAB — POCT CARDIAC MARKERS: Myoglobin, poc: 146 ng/mL (ref 12–200)

## 2010-12-16 LAB — BASIC METABOLIC PANEL
CO2: 24 mEq/L (ref 19–32)
Chloride: 99 mEq/L (ref 96–112)
GFR calc Af Amer: >60 mL/min (ref >60)
Glucose, Bld: 121 mg/dL — ABNORMAL HIGH (ref 70–99)
Sodium: 135 mEq/L (ref 135–145)

## 2010-12-16 LAB — DIFFERENTIAL
Basophils Relative: 1 % (ref 0–1)
Eosinophils Absolute: 0.2 10*3/uL (ref 0.0–0.7)
Eosinophils Relative: 2 % (ref 0–5)
Lymphs Abs: 2.3 10*3/uL (ref 0.7–4.0)
Monocytes Absolute: 0.9 10*3/uL (ref 0.1–1.0)
Monocytes Relative: 8 % (ref 3–12)

## 2010-12-16 LAB — CBC
HCT: 39.3 % (ref 36.0–46.0)
Hemoglobin: 13.2 g/dL (ref 12.0–15.0)
MCH: 30.4 pg (ref 26.0–34.0)
MCHC: 33.6 g/dL (ref 30.0–36.0)
MCV: 90.6 fL (ref 78.0–100.0)
RBC: 4.34 MIL/uL (ref 3.87–5.11)

## 2010-12-16 LAB — POCT CARDIAC MARKERS: Myoglobin, poc: 140 ng/mL (ref 12–200)

## 2010-12-16 NOTE — Assessment & Plan Note (Signed)
Summary: 3 month f/u per ckout 09/04/10 tg/ths   Visit Type:  Follow-up Primary Provider:  [facility]  CC:  patient had to use 3 nitro last night.  History of Present Illness: [name] returns for close followup of her chronic and severe CAD. She is still having angina at times requiring up to 3 NTG's. She wwas admitted 1/12 with CP, ruled out, and repeat cath showed stable anatomy. See cath report. She has been compliant with her meds and continues not to smoke. EKG without new or acute changes.   Current Medications (verified): 1)  Alprazolam 1 Mg Tabs (Alprazolam) .... Four Times A Day As Needed 2)  Prozac 20 Mg Caps (Fluoxetine Hcl) .... Take 1 Tablet By Mouth Once A Day 3)  Lovastatin 40 Mg Tabs (Lovastatin) .... Take 2 Tablets By Mouth At Bedtime 4)  Trazodone Hcl 50 Mg Tabs (Trazodone Hcl) .... Take 1/2 To 1 Tab As Needed 5)  Aleve 220 Mg Tabs (Naproxen Sodium) .... As Needed 6)  Nitrostat 0.4 Mg Subl (Nitroglycerin) .Marland Kitchen.. 1 Tablet Under Tongue At Onset of Chest Pain; You May Repeat Every 5 Minutes For Up To 3 Doses. 7)  Nexium 40 Mg Cpdr (Esomeprazole Magnesium) .... Take 1 Tablet By Mouth Once A Day 8)  Effient 10 Mg Tabs (Prasugrel Hcl) .... Take 1 Tab Daily 9)  Synthroid 50 Mcg Tabs (Levothyroxine Sodium) .... Take 1 Tab Daily 10)  Aspirin 325 Mg Tabs (Aspirin) .... Take 1 Tab Daily 11)  Benazepril Hcl 20 Mg Tabs (Benazepril Hcl) .... Take 1 Tablet By Mouth Every Morning. 12)  Tegretol 200 Mg Tabs (Carbamazepine) .... Take 1 Tab Two Times A Day 13)  Toprol Xl 50 Mg Xr24h-Tab (Metoprolol Succinate) .... 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  Social History: Last updated: 08/12/2010 Tobacco Use - Yes Regular Exercise - no Drug Use - yes (former), polysubstance abuse Married. No alcohol in 2 years.    Risk Factors: Exercise: no (05/27/2009)  Risk Factors: Smoking Status: current (05/27/2009)  Review of Systems       negative other than HPI  Vital Signs:  Patient profile:   55 year old female Weight:      250 pounds BMI:     43.07 Pulse rate:   73 / minute BP sitting:   137 / 81  (left arm)  Vitals Entered By: Dreama Saa, CNA (December 12, 2010 11:34 AM)  Physical Exam  General:  obese.  NAD Head:  normocephalic and atraumatic Eyes:  PERRLA/EOM intact; conjunctiva and lids normal. Neck:  Neck supple, no JVD. No masses, thyromegaly or abnormal cervical nodes. Lungs:  Clear bilaterally to auscultation and percussion. Heart:  RRR, NL S1 AND S2, NO MURMUR OR BRUIT Msk:  Back  normal, normal gait. Muscle strength and tone normal. Pulses:  pulses normal in all 4 extremities Extremities:  No clubbing or cyanosis. Neurologic:  Alert and oriented x 3. Skin:  Intact without lesions or rashes. Psych:  Normal affect.   Impression & Recommendations:  Problem # 1:  ANGINA, STABLE (ICD-413.9) Assessment Deteriorated Will increase her Toprol to 75mg  a day. SL NTG renewed. Close followup in 3 mos. The following medications were removed from the medication list:    Metoprolol Tartrate 25 Mg Tabs  (Metoprolol tartrate) .Marland Kitchen... Take 1/2 tab two times a day Her updated medication list for this problem includes:    Nitrostat 0.4 Mg Subl (Nitroglycerin) .Marland Kitchen... 1 tablet under tongue at onset of chest pain; you may repeat every 5 minutes for up to 3 doses.    Effient 10 Mg Tabs (Prasugrel hcl) .Marland Kitchen... Take 1 tab daily    Aspirin 325 Mg Tabs (Aspirin) .Marland Kitchen... Take 1 tab daily    Benazepril Hcl 20 Mg Tabs (Benazepril hcl) .Marland Kitchen... Take 1 tablet by mouth every morning.    Toprol Xl 50 Mg Xr24h-tab (Metoprolol succinate) .Marland Kitchen... Take 1 tab daily  Problem # 2:  CAROTID OCCLUSIVE DISEASE (ICD-433.10) Assessment: Unchanged  Her updated medication list for this problem includes:    Effient 10 Mg Tabs (Prasugrel hcl) .Marland Kitchen... Take 1 tab daily    Aspirin 325 Mg Tabs (Aspirin) .Marland Kitchen... Take 1 tab daily  Problem # 3:  HYPERTENSION (ICD-401.9) Assessment: Improved  The following medications were removed from the medication list:    Metoprolol Tartrate 25 Mg Tabs (Metoprolol tartrate) .Marland Kitchen... Take 1/2 tab two times a day Her updated medication list for this problem includes:    Aspirin 325 Mg Tabs (Aspirin) .Marland Kitchen... Take 1 tab daily    Benazepril Hcl 20 Mg Tabs (Benazepril hcl) .Marland Kitchen... Take 1 tablet by mouth every morning.    Toprol Xl 50 Mg Xr24h-tab (Metoprolol succinate) .Marland Kitchen... Take 1 tab daily  Problem # 4:  DYSLIPIDEMIA (ICD-272.4) Assessment: Improved  Her updated medication list for this problem includes:    Lovastatin 40 Mg Tabs (Lovastatin) .Marland Kitchen... Take 2 tablets by mouth at bedtime  Problem # 5:  CORONARY ATHEROSCLEROSIS NATIVE CORONARY ARTERY (ICD-414.01) Assessment: Unchanged  The following medications were removed from the medication list:    Metoprolol Tartrate 25 Mg Tabs (Metoprolol tartrate) .Marland Kitchen... Take 1/2 tab two times a day Her updated medication list for this problem includes:    Nitrostat 0.4 Mg Subl (Nitroglycerin) .Marland Kitchen... 1 tablet under tongue at onset of chest pain; you may repeat every 5 minutes  for up to 3 doses.    Effient 10 Mg Tabs (Prasugrel hcl) .Marland Kitchen... Take 1 tab daily    Aspirin 325 Mg Tabs (Aspirin) .Marland Kitchen... Take 1 tab daily    Benazepril Hcl 20 Mg Tabs (Benazepril hcl) .Marland Kitchen... Take 1 tablet by mouth every morning.    Toprol Xl 50 Mg Xr24h-tab (Metoprolol succinate) .Marland Kitchen... Take 1 tab daily  Patient Instructions: 1)  Your physician recommends that you schedule a follow-up appointment in: 3 months 2)  Your physician recommends that you continue on your current medications as directed. Please refer to the Current Medication list given to you today.   Appended Document: 3 month f/u per ckout 09/04/10 tg/ths    Clinical Lists Changes  Medications: Changed medication from TOPROL XL 50 MG XR24H-TAB (METOPROLOL SUCCINATE) take 1 tab daily to TOPROL XL 50 MG XR24H-TAB (METOPROLOL SUCCINATE) take 1 and 1/2  tabs daily - Signed  Pt. advised of Metoprolol increase to 75mg  (take 1 and 1/2 tablet of 50mg  tablet) by mouth once daily and states she understands instruction given.

## 2010-12-16 NOTE — Progress Notes (Signed)
**Note De-Identified [name] Obfuscation** Summary: rx refill   Phone Note Call from Patient Call back at Home Phone [phone]   Caller: pt Reason for Call: Refill Medication Summary of Call: pt needs rx levothyroxine 50 microgram tab called in to [facility]. she states that dr bensimhon. Initial call taken by: Faythe Ghee,  December 12, 2010 1:12 PM    Prescriptions: SYNTHROID 50 MCG TABS (LEVOTHYROXINE SODIUM) take 1 tab daily  #30 x 3   Entered by:   Larita Fife Nayson Traweek LPN   Authorized by:   Gaylord Shih, MD, [facility]   Signed by:   Larita Fife Angelita Harnack LPN on 09/81/1914   Method used:   Electronically to        [facility] (retail)       [address]       [place], [place]  [address]       Ph: [phone]       Fax: [phone]   RxID:   [id number] LOVASTATIN 40 MG TABS (LOVASTATIN) take 2 tablets by mouth at bedtime  #1 x 1   Entered by:   Larita Fife Tyrian Peart LPN   Authorized by:   Gaylord Shih, MD, [facility]   Signed by:   Larita Fife Jeanine Caven LPN on 40/34/7425   Method used:   Electronically to        [facility] (retail)       [address]       [place], [place]  [address]       Ph: [phone]       Fax: [phone]   RxID:   [id number] EFFIENT 10 MG TABS (PRASUGREL HCL) take 1 tab daily  #30 x 3   Entered by:   Larita Fife Lalla Laham LPN   Authorized by:   Gaylord Shih, MD, [facility]   Signed by:   Larita Fife Nicodemus Denk LPN on 25/42/7062   Method used:   Electronically to        [facility] (retail)       [address]       [place], [place]  [address]       Ph: [phone]       Fax: [phone]   RxID:   [id number]

## 2010-12-17 LAB — COMPREHENSIVE METABOLIC PANEL WITH GFR
ALT: 16 U/L (ref 0–35)
AST: 24 U/L (ref 0–37)
Albumin: 4 g/dL (ref 3.5–5.2)
Alkaline Phosphatase: 56 U/L (ref 39–117)
BUN: 20 mg/dL (ref 6–23)
CO2: 27 meq/L (ref 19–32)
Calcium: 8.8 mg/dL (ref 8.4–10.5)
Chloride: 101 meq/L (ref 96–112)
Creatinine, Ser: 0.98 mg/dL (ref 0.4–1.2)
GFR calc non Af Amer: 59 mL/min — ABNORMAL LOW
Glucose, Bld: 119 mg/dL — ABNORMAL HIGH (ref 70–99)
Potassium: 4 meq/L (ref 3.5–5.1)
Sodium: 137 meq/L (ref 135–145)
Total Bilirubin: 0.5 mg/dL (ref 0.3–1.2)
Total Protein: 6.8 g/dL (ref 6.0–8.3)

## 2010-12-17 LAB — CBC
HCT: 31.6 % — ABNORMAL LOW (ref 36.0–46.0)
HCT: 33.7 % — ABNORMAL LOW (ref 36.0–46.0)
HCT: 34.5 % — ABNORMAL LOW (ref 36.0–46.0)
HCT: 34.8 % — ABNORMAL LOW (ref 36.0–46.0)
Hemoglobin: 10.8 g/dL — ABNORMAL LOW (ref 12.0–15.0)
Hemoglobin: 10.9 g/dL — ABNORMAL LOW (ref 12.0–15.0)
MCHC: 31.6 g/dL (ref 30.0–36.0)
MCHC: 32.3 g/dL (ref 30.0–36.0)
MCHC: 32.5 g/dL (ref 30.0–36.0)
MCV: 93.6 fL (ref 78.0–100.0)
Platelets: 211 10*3/uL (ref 150–400)
Platelets: 218 10*3/uL (ref 150–400)
Platelets: 244 10*3/uL (ref 150–400)
RBC: 3.6 MIL/uL — ABNORMAL LOW (ref 3.87–5.11)
RBC: 3.66 MIL/uL — ABNORMAL LOW (ref 3.87–5.11)
RDW: 12.9 % (ref 11.5–15.5)
RDW: 13.1 % (ref 11.5–15.5)
WBC: 6.3 10*3/uL (ref 4.0–10.5)
WBC: 7.5 10*3/uL (ref 4.0–10.5)
WBC: 7.8 10*3/uL (ref 4.0–10.5)

## 2010-12-17 LAB — CARDIAC PANEL(CRET KIN+CKTOT+MB+TROPI)
CK, MB: 10.3 ng/mL (ref 0.3–4.0)
CK, MB: 14.4 ng/mL (ref 0.3–4.0)
Relative Index: 5.3 — ABNORMAL HIGH (ref 0.0–2.5)
Total CK: 193 U/L — ABNORMAL HIGH (ref 7–177)
Troponin I: 1.27 ng/mL (ref 0.00–0.06)

## 2010-12-17 LAB — HEPARIN LEVEL (UNFRACTIONATED)
Heparin Unfractionated: 0.34 IU/mL (ref 0.30–0.70)
Heparin Unfractionated: 0.38 IU/mL (ref 0.30–0.70)
Heparin Unfractionated: 0.51 IU/mL (ref 0.30–0.70)

## 2010-12-17 LAB — TSH
TSH: 10.73 u[IU]/mL — ABNORMAL HIGH (ref 0.350–4.500)
TSH: 9.945 u[IU]/mL — ABNORMAL HIGH (ref 0.350–4.500)

## 2010-12-17 LAB — PLATELET INHIBITION P2Y12
P2Y12 % Inhibition: 0 %
Platelet Function Baseline: 298 [PRU] (ref 194–418)

## 2010-12-17 LAB — BASIC METABOLIC PANEL WITH GFR
BUN: 14 mg/dL (ref 6–23)
CO2: 30 meq/L (ref 19–32)
Calcium: 9.2 mg/dL (ref 8.4–10.5)
Chloride: 104 meq/L (ref 96–112)
Creatinine, Ser: 0.86 mg/dL (ref 0.4–1.2)
GFR calc non Af Amer: >60 mL/min
Glucose, Bld: 128 mg/dL — ABNORMAL HIGH (ref 70–99)
Potassium: 4 meq/L (ref 3.5–5.1)
Sodium: 140 meq/L (ref 135–145)

## 2010-12-17 LAB — BASIC METABOLIC PANEL
BUN: 14 mg/dL (ref 6–23)
Calcium: 9.1 mg/dL (ref 8.4–10.5)
Calcium: 9.3 mg/dL (ref 8.4–10.5)
GFR calc Af Amer: >60 mL/min (ref >60)
GFR calc non Af Amer: 59 mL/min — ABNORMAL LOW (ref >60)
GFR calc non Af Amer: >60 mL/min (ref >60)
Glucose, Bld: 101 mg/dL — ABNORMAL HIGH (ref 70–99)
Glucose, Bld: 120 mg/dL — ABNORMAL HIGH (ref 70–99)
Potassium: 4.4 mEq/L (ref 3.5–5.1)
Potassium: 4.5 mEq/L (ref 3.5–5.1)
Sodium: 137 mEq/L (ref 135–145)
Sodium: 140 mEq/L (ref 135–145)

## 2010-12-17 LAB — CREATININE, SERUM
Creatinine, Ser: 0.88 mg/dL (ref 0.4–1.2)
GFR calc Af Amer: >60 mL/min (ref >60)
GFR calc non Af Amer: >60 mL/min (ref >60)

## 2010-12-17 LAB — LIPID PANEL
Cholesterol: 201 mg/dL — ABNORMAL HIGH (ref 0–200)
HDL: 41 mg/dL (ref >39)
LDL Cholesterol: 100 mg/dL — ABNORMAL HIGH (ref 0–99)
Total CHOL/HDL Ratio: 4.9 RATIO

## 2010-12-17 LAB — HEMOGLOBIN A1C
Hgb A1c MFr Bld: 6.1 % — ABNORMAL HIGH
Mean Plasma Glucose: 128 mg/dL — ABNORMAL HIGH

## 2010-12-17 LAB — BRAIN NATRIURETIC PEPTIDE: Pro B Natriuretic peptide (BNP): 96 pg/mL (ref 0.0–100.0)

## 2010-12-17 LAB — DIFFERENTIAL
Basophils Absolute: 0 10*3/uL (ref 0.0–0.1)
Basophils Relative: 0 % (ref 0–1)
Lymphocytes Relative: 37 % (ref 12–46)
Neutro Abs: 3.7 10*3/uL (ref 1.7–7.7)
Neutrophils Relative %: 49 % (ref 43–77)

## 2010-12-17 LAB — T4, FREE: Free T4: 0.73 ng/dL — ABNORMAL LOW (ref 0.80–1.80)

## 2010-12-17 LAB — PROTIME-INR
INR: 1.03 (ref 0.00–1.49)
Prothrombin Time: 13.7 seconds (ref 11.6–15.2)

## 2010-12-18 ENCOUNTER — Telehealth: Payer: Self-pay

## 2010-12-22 LAB — POCT I-STAT 3, ART BLOOD GAS (G3+)
TCO2: 30 mmol/L (ref 0–100)
pCO2 arterial: 45.6 mmHg — ABNORMAL HIGH (ref 35.0–45.0)
pH, Arterial: 7.401 — ABNORMAL HIGH (ref 7.350–7.400)
pO2, Arterial: 65 mmHg — ABNORMAL LOW (ref 80.0–100.0)

## 2010-12-22 LAB — BASIC METABOLIC PANEL
BUN: 14 mg/dL (ref 6–23)
CO2: 30 mEq/L (ref 19–32)
Chloride: 104 mEq/L (ref 96–112)
Creatinine, Ser: 0.83 mg/dL (ref 0.4–1.2)

## 2010-12-22 LAB — POCT I-STAT 3, VENOUS BLOOD GAS (G3P V)
O2 Saturation: 60 %
pCO2, Ven: 50.5 mmHg — ABNORMAL HIGH (ref 45.0–50.0)

## 2010-12-22 LAB — CBC
HCT: 38.2 % (ref 36.0–46.0)
MCHC: 34.1 g/dL (ref 30.0–36.0)
MCHC: 34.5 g/dL (ref 30.0–36.0)
MCV: 91.7 fL (ref 78.0–100.0)
MCV: 92.2 fL (ref 78.0–100.0)
Platelets: 209 10*3/uL (ref 150–400)
Platelets: 233 10*3/uL (ref 150–400)
RBC: 4.16 MIL/uL (ref 3.87–5.11)
RDW: 13.6 % (ref 11.5–15.5)
WBC: 7.2 10*3/uL (ref 4.0–10.5)

## 2010-12-23 ENCOUNTER — Emergency Department (HOSPITAL_COMMUNITY)
Admission: EM | Admit: 2010-12-23 | Discharge: 2010-12-23 | Disposition: A | Discharge status: Home / Self Care | Payer: Medicaid Other | Attending: Emergency Medicine | Admitting: Emergency Medicine

## 2010-12-23 ENCOUNTER — Emergency Department (HOSPITAL_COMMUNITY): Payer: Medicaid Other

## 2010-12-23 DIAGNOSIS — X58XXXA Exposure to other specified factors, initial encounter: Secondary | ICD-10-CM | POA: Insufficient documentation

## 2010-12-23 DIAGNOSIS — S60229A Contusion of unspecified hand, initial encounter: Secondary | ICD-10-CM | POA: Insufficient documentation

## 2010-12-23 DIAGNOSIS — Y929 Unspecified place or not applicable: Secondary | ICD-10-CM | POA: Insufficient documentation

## 2010-12-23 DIAGNOSIS — I739 Peripheral vascular disease, unspecified: Secondary | ICD-10-CM | POA: Insufficient documentation

## 2010-12-23 DIAGNOSIS — S62309A Unspecified fracture of unspecified metacarpal bone, initial encounter for closed fracture: Secondary | ICD-10-CM | POA: Insufficient documentation

## 2010-12-23 DIAGNOSIS — Z79899 Other long term (current) drug therapy: Secondary | ICD-10-CM | POA: Insufficient documentation

## 2010-12-23 DIAGNOSIS — G40909 Epilepsy, unspecified, not intractable, without status epilepticus: Secondary | ICD-10-CM | POA: Insufficient documentation

## 2010-12-23 DIAGNOSIS — I10 Essential (primary) hypertension: Secondary | ICD-10-CM | POA: Insufficient documentation

## 2010-12-23 DIAGNOSIS — F411 Generalized anxiety disorder: Secondary | ICD-10-CM | POA: Insufficient documentation

## 2010-12-23 DIAGNOSIS — J4489 Other specified chronic obstructive pulmonary disease: Secondary | ICD-10-CM | POA: Insufficient documentation

## 2010-12-23 DIAGNOSIS — K219 Gastro-esophageal reflux disease without esophagitis: Secondary | ICD-10-CM | POA: Insufficient documentation

## 2010-12-23 DIAGNOSIS — E785 Hyperlipidemia, unspecified: Secondary | ICD-10-CM | POA: Insufficient documentation

## 2010-12-23 DIAGNOSIS — I509 Heart failure, unspecified: Secondary | ICD-10-CM | POA: Insufficient documentation

## 2010-12-23 DIAGNOSIS — M79609 Pain in unspecified limb: Secondary | ICD-10-CM | POA: Insufficient documentation

## 2010-12-23 DIAGNOSIS — J449 Chronic obstructive pulmonary disease, unspecified: Secondary | ICD-10-CM | POA: Insufficient documentation

## 2010-12-23 DIAGNOSIS — I252 Old myocardial infarction: Secondary | ICD-10-CM | POA: Insufficient documentation

## 2010-12-23 DIAGNOSIS — I2510 Atherosclerotic heart disease of native coronary artery without angina pectoris: Secondary | ICD-10-CM | POA: Insufficient documentation

## 2010-12-23 DIAGNOSIS — I779 Disorder of arteries and arterioles, unspecified: Secondary | ICD-10-CM | POA: Insufficient documentation

## 2010-12-23 NOTE — Progress Notes (Signed)
**Note De-Identified Rafaela Dinius Obfuscation** Summary: approval on Effient  Phone Note Call from Patient Call back at Home Phone [phone]   Caller: PT Reason for Call: Refill Medication, Talk to Nurse Summary of Call: PT WAS TOLD TO START TAKING EFFIENT AGAIN AND FOUND OUT THAT HER INSURANCE WILL NOT PAY. SHE WOULD LIKE TO GO BACK ON PLAVIX SHE HAS PLENTY OF THAT AT HOME. [facility] Initial call taken by: Faythe Ghee,  December 18, 2010 9:35 AM  Follow-up for Phone Call        Over the phone prior auth. obtained from [facility] for Effient starting date 12-18-10 through 12-18-11. Pt. and [facility] pharmacy notified. Follow-up by: Larita Fife Tishawna Larouche LPN,  December 18, 2010 9:52 AM

## 2011-01-06 ENCOUNTER — Telehealth: Payer: Self-pay | Admitting: Cardiology

## 2011-01-06 NOTE — Telephone Encounter (Signed)
Pt is very SOB after taking a shower, she states that it has been going on since 01/03/11. She feels like her heart is irregular also. I made her a appointment with kathryn for 01/07/11 at 1:00. Please call pt back again she is extremely out of breath.

## 2011-01-07 ENCOUNTER — Ambulatory Visit (INDEPENDENT_AMBULATORY_CARE_PROVIDER_SITE_OTHER): Payer: Medicaid Other | Admitting: Adult Health

## 2011-01-07 ENCOUNTER — Encounter: Payer: Self-pay | Admitting: Adult Health

## 2011-01-07 DIAGNOSIS — E7800 Pure hypercholesterolemia, unspecified: Secondary | ICD-10-CM

## 2011-01-07 DIAGNOSIS — I10 Essential (primary) hypertension: Secondary | ICD-10-CM

## 2011-01-07 DIAGNOSIS — I2510 Atherosclerotic heart disease of native coronary artery without angina pectoris: Secondary | ICD-10-CM

## 2011-01-07 DIAGNOSIS — F411 Generalized anxiety disorder: Secondary | ICD-10-CM

## 2011-01-07 DIAGNOSIS — R0602 Shortness of breath: Secondary | ICD-10-CM

## 2011-01-07 DIAGNOSIS — J4489 Other specified chronic obstructive pulmonary disease: Secondary | ICD-10-CM

## 2011-01-07 DIAGNOSIS — J449 Chronic obstructive pulmonary disease, unspecified: Secondary | ICD-10-CM

## 2011-01-07 MED ORDER — TRAMADOL HCL 50 MG PO TABS: 50.0000 mg | ORAL_TABLET | Freq: Two times a day (BID) | ORAL | Status: DC | PRN

## 2011-01-07 MED ORDER — ZOLPIDEM TARTRATE 5 MG PO TABS: 5.0000 mg | ORAL_TABLET | Freq: Every evening | ORAL | Status: DC | PRN

## 2011-01-07 NOTE — Patient Instructions (Signed)
Your physician recommends that you schedule a follow-up appointment in: 6 months Your physician has recommended that you have a pulmonary function test. Pulmonary Function Tests are a group of tests that measure how well air moves in and out of your lungs. You have been referred to Dr. [name] for shortness of breath and snoring

## 2011-01-07 NOTE — Assessment & Plan Note (Signed)
Currently well controlled.  No changes in her medications.

## 2011-01-07 NOTE — Assessment & Plan Note (Signed)
I have referred her to Dr. [name], pulmonologist for further testing. I have no record of recent PFT's and she states she has not done any.  She may need a sleep study as well.  This will be evaluated and recommendations made by Dr. [name].

## 2011-01-07 NOTE — Assessment & Plan Note (Signed)
She had chronic angina but I am not certain that all of her pain is from this. She is very adamant about getting medications for pain control.  I have offered a Rx for Ultram but she states she can get this from [facility] and it does not help with the pain.  She states that NTG is not helpful for the pain either. I am not inclined to put her on long acting nitrates.  She does not seem interested in this. Will continue her on BB, Effient, Aspirin,and ACE inhibitors.

## 2011-01-07 NOTE — Progress Notes (Signed)
Patient ID: [name], female    DOB: [birth year], 55 y.o.   MRN: [id number]  HPI [name] returns for follow-up after having been admitted for recurrent chest pain in the setting of severe CAD, chronic angina, CABG.  She has continued history of smoking, life stressors with marriage, hypothyroidism, and GERD.  She comes today after having seen Dr. Daleen Squibb on 12/12/2010 with no complaints.  Today she states she is having chest pressure and heaviness in her arms with increased DOE. She had a recent cardiac catheterization Jan 2012 demonstrating severe native vessel disease with total LAD, Cx RCA, SVG to OM .totaled, SVG to LAd totaled, SVG to RCA BMS stent shows no stenosis. LIMA was patent and filled the distal OM branch.  She is to be treated medically only at this point.  She states that she has chronic pain in her arms, legs and hips. She is to be seeing Dr. Romeo Apple soon to be evaluated for this.  She states she is often SOB and has been snoring at night. She also has issues with anxiety and depression. She requests medication for pain until seen by Dr. Romeo Apple, and also a list of physicians who accept medicaid patients.    Review of SystemsReview of systems complete and found to be negative unless listed above    Physical Exam General: Well developed, well nourished, obese, in no acute distress Head: Eyes PERRLA, No xanthomas.   Normal cephalic and atramatic  Lungs: Clear bilaterally to auscultation and percussion. Heart: HRRR S1 S2, distant heart sounds.  Pulses are 2+ & equal.            No carotid bruit. No JVD.  No abdominal bruits. No femoral bruits. Abdomen: Bowel sounds are positive, abdomen soft and non-tender without masses or                  Hernia's noted. Msk:  Back normal, normal gait. Normal strength and tone for age. Extremities: No clubbing, cyanosis or edema.  DP +1, several ecchymotic areas on legs and hips. Neuro: Alert and oriented X 3. Psych:  Good affect, responds  appropriately    EKG: Sinus bradycardia, T wave abnormality consider lateral ischemia, cannot rule out anterior infarct.  (No change from EKG completed in March 2012). No Known Allergies Current outpatient prescriptions:ALPRAZolam (XANAX) 1 MG tablet, Take 1 mg by mouth at bedtime as needed.  , Disp: , Rfl: ;  aspirin 325 MG tablet, Take 325 mg by mouth daily.  , Disp: , Rfl: ;  benazepril (LOTENSIN) 20 MG tablet, Take 20 mg by mouth daily.  , Disp: , Rfl: ;  carbamazepine (TEGRETOL XR) 200 MG 12 hr tablet, Take 200 mg by mouth 2 (two) times daily.  , Disp: , Rfl:  esomeprazole (NEXIUM) 40 MG capsule, Take 40 mg by mouth daily before breakfast.  , Disp: , Rfl: ;  FLUoxetine (PROZAC) 20 MG tablet, Take 20 mg by mouth daily.  , Disp: , Rfl: ;  levothyroxine (SYNTHROID, LEVOTHROID) 50 MCG tablet, Take 50 mcg by mouth daily.  , Disp: , Rfl: ;  lovastatin (MEVACOR) 40 MG tablet, Take 40 mg by mouth 2 (two) times daily.  , Disp: , Rfl:  metoprolol (TOPROL-XL) 50 MG 24 hr tablet, Take 50 mg by mouth daily.  , Disp: , Rfl: ;  naproxen sodium (ALEVE) 220 MG tablet, Take 220 mg by mouth as needed.  , Disp: , Rfl: ;  nitroGLYCERIN (NITROSTAT) 0.4 MG SL  tablet, Place 0.4 mg under the tongue every 5 (five) minutes as needed.  , Disp: , Rfl: ;  prasugrel (EFFIENT) 10 MG TABS, Take 10 mg by mouth daily.  , Disp: , Rfl:  traZODone (DESYREL) 50 MG tablet, Take 50 mg by mouth at bedtime.  , Disp: , Rfl: ;  traMADol (ULTRAM) 50 MG tablet, Take 1 tablet (50 mg total) by mouth 2 (two) times daily as needed for pain., Disp: 60 tablet, Rfl: 1;  zolpidem (AMBIEN) 5 MG tablet, Take 1 tablet (5 mg total) by mouth at bedtime as needed for sleep., Disp: 60 tablet, Rfl: 0

## 2011-01-07 NOTE — Assessment & Plan Note (Signed)
She is to seek medical attention for her multiple somatic complaints. I have given her a list of names of physicians which accept medicaid.  No further medications are provided.

## 2011-01-08 ENCOUNTER — Telehealth: Payer: Self-pay | Admitting: Adult Health

## 2011-01-08 NOTE — Telephone Encounter (Signed)
RX for Ambien 5mg  (#60 with no refills to be taken prn for sleep) called into [place] pharmacy,  pt aware.

## 2011-01-08 NOTE — Telephone Encounter (Signed)
Pt Ambien was not called in to [facility] yesterday at visit she states she received one rx but not for the ambien.

## 2011-01-11 LAB — CULTURE, BLOOD (ROUTINE X 2)
Culture: NO GROWTH
Report Status: 7192010
Report Status: 7192010

## 2011-01-11 LAB — BASIC METABOLIC PANEL
BUN: 18 mg/dL (ref 6–23)
CO2: 37 mEq/L — ABNORMAL HIGH (ref 19–32)
CO2: 39 mEq/L — ABNORMAL HIGH (ref 19–32)
Calcium: 8.4 mg/dL (ref 8.4–10.5)
Calcium: 8.4 mg/dL (ref 8.4–10.5)
Calcium: 8.4 mg/dL (ref 8.4–10.5)
Chloride: 94 mEq/L — ABNORMAL LOW (ref 96–112)
Chloride: 97 mEq/L (ref 96–112)
Creatinine, Ser: 0.78 mg/dL (ref 0.4–1.2)
Creatinine, Ser: 0.87 mg/dL (ref 0.4–1.2)
Creatinine, Ser: 0.88 mg/dL (ref 0.4–1.2)
GFR calc Af Amer: >60 mL/min (ref >60)
GFR calc Af Amer: >60 mL/min (ref >60)
GFR calc Af Amer: >60 mL/min (ref >60)
GFR calc Af Amer: >60 mL/min (ref >60)
GFR calc non Af Amer: >60 mL/min (ref >60)
GFR calc non Af Amer: >60 mL/min (ref >60)
Glucose, Bld: 104 mg/dL — ABNORMAL HIGH (ref 70–99)
Potassium: 3.4 mEq/L — ABNORMAL LOW (ref 3.5–5.1)
Potassium: 3.5 mEq/L (ref 3.5–5.1)
Potassium: 4 mEq/L (ref 3.5–5.1)
Sodium: 137 mEq/L (ref 135–145)
Sodium: 139 mEq/L (ref 135–145)
Sodium: 140 mEq/L (ref 135–145)
Sodium: 141 mEq/L (ref 135–145)

## 2011-01-11 LAB — GLUCOSE, CAPILLARY
Glucose-Capillary: 135 mg/dL — ABNORMAL HIGH (ref 70–99)
Glucose-Capillary: 148 mg/dL — ABNORMAL HIGH (ref 70–99)
Glucose-Capillary: 155 mg/dL — ABNORMAL HIGH (ref 70–99)

## 2011-01-11 LAB — DIFFERENTIAL
Basophils Absolute: 0.1 10*3/uL (ref 0.0–0.1)
Basophils Absolute: 0 10*3/uL (ref 0.0–0.1)
Basophils Absolute: 0 10*3/uL (ref 0.0–0.1)
Basophils Relative: 0 % (ref 0–1)
Basophils Relative: 0 % (ref 0–1)
Basophils Relative: 0 % (ref 0–1)
Basophils Relative: 0 % (ref 0–1)
Eosinophils Absolute: 0 10*3/uL (ref 0.0–0.7)
Eosinophils Absolute: 0.1 10*3/uL (ref 0.0–0.7)
Eosinophils Absolute: 0.1 10*3/uL (ref 0.0–0.7)
Eosinophils Relative: 1 % (ref 0–5)
Eosinophils Relative: 2 % (ref 0–5)
Eosinophils Relative: 2 % (ref 0–5)
Lymphocytes Relative: 13 % (ref 12–46)
Lymphocytes Relative: 20 % (ref 12–46)
Lymphocytes Relative: 24 % (ref 12–46)
Lymphs Abs: 1.2 10*3/uL (ref 0.7–4.0)
Lymphs Abs: 1.3 10*3/uL (ref 0.7–4.0)
Monocytes Absolute: 0.8 10*3/uL (ref 0.1–1.0)
Monocytes Absolute: 0.9 10*3/uL (ref 0.1–1.0)
Monocytes Absolute: 1 10*3/uL (ref 0.1–1.0)
Monocytes Absolute: 1 10*3/uL (ref 0.1–1.0)
Monocytes Absolute: 1.1 10*3/uL — ABNORMAL HIGH (ref 0.1–1.0)
Monocytes Absolute: 0.9 10*3/uL (ref 0.1–1.0)
Monocytes Relative: 13 % — ABNORMAL HIGH (ref 3–12)
Monocytes Relative: 9 % (ref 3–12)
Monocytes Relative: 9 % (ref 3–12)
Neutro Abs: 5.1 10*3/uL (ref 1.7–7.7)
Neutro Abs: 7 10*3/uL (ref 1.7–7.7)
Neutro Abs: 8 10*3/uL — ABNORMAL HIGH (ref 1.7–7.7)
Neutro Abs: 10.4 10*3/uL — ABNORMAL HIGH (ref 1.7–7.7)
Neutro Abs: 7.3 10*3/uL (ref 1.7–7.7)
Neutrophils Relative %: 76 % (ref 43–77)
Neutrophils Relative %: 85 % — ABNORMAL HIGH (ref 43–77)
Neutrophils Relative %: 73 % (ref 43–77)
Neutrophils Relative %: 79 % — ABNORMAL HIGH (ref 43–77)

## 2011-01-11 LAB — COMPREHENSIVE METABOLIC PANEL
ALT: 43 U/L — ABNORMAL HIGH (ref 0–35)
ALT: 19 U/L (ref 0–35)
Albumin: 3.6 g/dL (ref 3.5–5.2)
Alkaline Phosphatase: 135 U/L — ABNORMAL HIGH (ref 39–117)
Alkaline Phosphatase: 71 U/L (ref 39–117)
Alkaline Phosphatase: 80 U/L (ref 39–117)
BUN: 7 mg/dL (ref 6–23)
BUN: 10 mg/dL (ref 6–23)
BUN: 16 mg/dL (ref 6–23)
CO2: 23 mEq/L (ref 19–32)
CO2: 26 mEq/L (ref 19–32)
Calcium: 8.5 mg/dL (ref 8.4–10.5)
Chloride: 102 mEq/L (ref 96–112)
Chloride: 102 mEq/L (ref 96–112)
GFR calc non Af Amer: >60 mL/min (ref >60)
Glucose, Bld: 104 mg/dL — ABNORMAL HIGH (ref 70–99)
Glucose, Bld: 153 mg/dL — ABNORMAL HIGH (ref 70–99)
Glucose, Bld: 124 mg/dL — ABNORMAL HIGH (ref 70–99)
Glucose, Bld: 129 mg/dL — ABNORMAL HIGH (ref 70–99)
Potassium: 3.3 mEq/L — ABNORMAL LOW (ref 3.5–5.1)
Potassium: 4 mEq/L (ref 3.5–5.1)
Potassium: 4.1 mEq/L (ref 3.5–5.1)
Sodium: 135 mEq/L (ref 135–145)
Sodium: 138 mEq/L (ref 135–145)
Sodium: 133 mEq/L — ABNORMAL LOW (ref 135–145)
Total Bilirubin: 0.5 mg/dL (ref 0.3–1.2)
Total Bilirubin: 0.6 mg/dL (ref 0.3–1.2)
Total Protein: 6.4 g/dL (ref 6.0–8.3)
Total Protein: 6.7 g/dL (ref 6.0–8.3)
Total Protein: 6.4 g/dL (ref 6.0–8.3)

## 2011-01-11 LAB — CBC
HCT: 26.9 % — ABNORMAL LOW (ref 36.0–46.0)
HCT: 27.8 % — ABNORMAL LOW (ref 36.0–46.0)
HCT: 29 % — ABNORMAL LOW (ref 36.0–46.0)
HCT: 30.4 % — ABNORMAL LOW (ref 36.0–46.0)
HCT: 29.7 % — ABNORMAL LOW (ref 36.0–46.0)
HCT: 33.5 % — ABNORMAL LOW (ref 36.0–46.0)
Hemoglobin: 10.4 g/dL — ABNORMAL LOW (ref 12.0–15.0)
Hemoglobin: 9.1 g/dL — ABNORMAL LOW (ref 12.0–15.0)
Hemoglobin: 9.2 g/dL — ABNORMAL LOW (ref 12.0–15.0)
Hemoglobin: 9.8 g/dL — ABNORMAL LOW (ref 12.0–15.0)
Hemoglobin: 9.9 g/dL — ABNORMAL LOW (ref 12.0–15.0)
Hemoglobin: 10 g/dL — ABNORMAL LOW (ref 12.0–15.0)
Hemoglobin: 11.6 g/dL — ABNORMAL LOW (ref 12.0–15.0)
MCHC: 33.2 g/dL (ref 30.0–36.0)
MCHC: 33.7 g/dL (ref 30.0–36.0)
MCHC: 34.3 g/dL (ref 30.0–36.0)
MCV: 90.3 fL (ref 78.0–100.0)
Platelets: 264 10*3/uL (ref 150–400)
RBC: 2.96 MIL/uL — ABNORMAL LOW (ref 3.87–5.11)
RBC: 3.24 MIL/uL — ABNORMAL LOW (ref 3.87–5.11)
RBC: 3.28 MIL/uL — ABNORMAL LOW (ref 3.87–5.11)
RBC: 3.76 MIL/uL — ABNORMAL LOW (ref 3.87–5.11)
RDW: 14.5 % (ref 11.5–15.5)
RDW: 14.7 % (ref 11.5–15.5)
RDW: 15.4 % (ref 11.5–15.5)
RDW: 14.9 % (ref 11.5–15.5)
WBC: 10.3 10*3/uL (ref 4.0–10.5)
WBC: 12 10*3/uL — ABNORMAL HIGH (ref 4.0–10.5)
WBC: 13.2 10*3/uL — ABNORMAL HIGH (ref 4.0–10.5)

## 2011-01-11 LAB — CARDIAC PANEL(CRET KIN+CKTOT+MB+TROPI)
CK, MB: 2.4 ng/mL (ref 0.3–4.0)
CK, MB: 4.8 ng/mL — ABNORMAL HIGH (ref 0.3–4.0)
CK, MB: 1.3 ng/mL (ref 0.3–4.0)
Relative Index: 1.6 (ref 0.0–2.5)
Relative Index: 2.9 — ABNORMAL HIGH (ref 0.0–2.5)
Relative Index: INVALID (ref 0.0–2.5)
Relative Index: INVALID (ref 0.0–2.5)
Total CK: 149 U/L (ref 7–177)
Total CK: 80 U/L (ref 7–177)
Troponin I: 0.39 ng/mL — ABNORMAL HIGH (ref 0.00–0.06)
Troponin I: 0.03 ng/mL (ref 0.00–0.06)
Troponin I: 0.04 ng/mL (ref 0.00–0.06)

## 2011-01-11 LAB — BLOOD GAS, ARTERIAL
Acid-Base Excess: 0.7 mmol/L (ref 0.0–2.0)
Bicarbonate: 25.4 mEq/L — ABNORMAL HIGH (ref 20.0–24.0)
O2 Saturation: 92.4 %
Patient temperature: 37
TCO2: 23.6 mmol/L (ref 0–100)

## 2011-01-11 LAB — CROSSMATCH: ABO/RH(D): O POS

## 2011-01-11 LAB — URINE MICROSCOPIC-ADD ON

## 2011-01-11 LAB — URINALYSIS, ROUTINE W REFLEX MICROSCOPIC
Bilirubin Urine: NEGATIVE
Glucose, UA: NEGATIVE mg/dL
Ketones, ur: NEGATIVE mg/dL
Nitrite: NEGATIVE
pH: 6.5 (ref 5.0–8.0)

## 2011-01-11 LAB — CLOSTRIDIUM DIFFICILE EIA
C difficile Toxins A+B, EIA: NEGATIVE
C difficile Toxins A+B, EIA: NEGATIVE

## 2011-01-11 LAB — BRAIN NATRIURETIC PEPTIDE: Pro B Natriuretic peptide (BNP): 685 pg/mL — ABNORMAL HIGH (ref 0.0–100.0)

## 2011-01-11 LAB — CHLORIDE, URINE, RANDOM: Chloride Urine: 118 mEq/L

## 2011-01-11 LAB — LIPASE, BLOOD: Lipase: 28 U/L (ref 11–59)

## 2011-01-12 ENCOUNTER — Other Ambulatory Visit (HOSPITAL_COMMUNITY): Payer: Self-pay | Admitting: Cardiology

## 2011-01-15 ENCOUNTER — Ambulatory Visit (HOSPITAL_COMMUNITY)
Admission: RE | Admit: 2011-01-15 | Discharge: 2011-01-15 | Disposition: A | Discharge status: Home / Self Care | Payer: Medicaid Other | Source: Ambulatory Visit | Attending: Gastroenterology | Admitting: Gastroenterology

## 2011-01-15 ENCOUNTER — Encounter: Payer: Self-pay | Admitting: Adult Health

## 2011-01-15 ENCOUNTER — Ambulatory Visit (INDEPENDENT_AMBULATORY_CARE_PROVIDER_SITE_OTHER): Payer: Medicaid Other | Admitting: Gastroenterology

## 2011-01-15 ENCOUNTER — Encounter (HOSPITAL_COMMUNITY): Payer: Medicaid Other

## 2011-01-15 ENCOUNTER — Encounter: Payer: Self-pay | Admitting: Gastroenterology

## 2011-01-15 VITALS — BP 124/80 | HR 68 | Temp 99.3°F | Ht 63.0 in | Wt 239.8 lb

## 2011-01-15 DIAGNOSIS — K5732 Diverticulitis of large intestine without perforation or abscess without bleeding: Secondary | ICD-10-CM

## 2011-01-15 DIAGNOSIS — R109 Unspecified abdominal pain: Secondary | ICD-10-CM | POA: Insufficient documentation

## 2011-01-15 DIAGNOSIS — K5730 Diverticulosis of large intestine without perforation or abscess without bleeding: Secondary | ICD-10-CM | POA: Insufficient documentation

## 2011-01-15 DIAGNOSIS — K5792 Diverticulitis of intestine, part unspecified, without perforation or abscess without bleeding: Secondary | ICD-10-CM | POA: Insufficient documentation

## 2011-01-15 DIAGNOSIS — N200 Calculus of kidney: Secondary | ICD-10-CM | POA: Insufficient documentation

## 2011-01-15 DIAGNOSIS — R112 Nausea with vomiting, unspecified: Secondary | ICD-10-CM | POA: Insufficient documentation

## 2011-01-15 MED ORDER — CIPROFLOXACIN HCL 500 MG PO TABS: 500.0000 mg | ORAL_TABLET | Freq: Two times a day (BID) | ORAL | Status: AC

## 2011-01-15 MED ORDER — IOHEXOL 300 MG/ML  SOLN
100.0000 mL | Freq: Once | INTRAMUSCULAR | Status: AC | PRN
  Administered 2011-01-15: 100 mL via INTRAVENOUS

## 2011-01-15 MED ORDER — HYDROCODONE-ACETAMINOPHEN 5-500 MG PO TABS: 1.0000 | ORAL_TABLET | ORAL | Status: DC | PRN

## 2011-01-15 MED ORDER — METRONIDAZOLE 500 MG PO TABS: 500.0000 mg | ORAL_TABLET | Freq: Three times a day (TID) | ORAL | Status: AC

## 2011-01-15 NOTE — Progress Notes (Signed)
Gastroenterologist: Dr. Darrick Penna Referring Provider: None Primary Care Physician:  [facility] Department, OT  Chief Complaint  Patient presents with  . Diverticulitis    x1 day    HPI:   [name] is a pleasant 55 year old Caucasian female who presents today in obvious discomfort. She was actually last seen by our office in Oct 2011 and confirmed to have diverticulitis, via CT findings. She was treated with 14 day course of Cipro and Flagyl. She significantly improved and was supposed to have a colonoscopy, as she has never had one. Due to loss of insurance, she was unable to return to our office. She returns today with recurrent LLQ pain since yesterday, severe, associated with N/V. Feels stabbing, extremely tender. Afebrile. Felt like it would offer some relief if she had a BM today; therefore, she ingested prune juice with results of watery, non-bloody stool.  Pt denies loss of appetite; however, she has steadily been losing wt since October 2011.  Oct 2011: 256 March 2012: 250 April 2012: 239  She does admit to trying to eat healthier; however, she was concerned regarding this trend.   Past Medical History  Diagnosis Date  . Coronary artery disease   . Hypertension   . COPD (chronic obstructive pulmonary disease)   . Hyperlipidemia   . Anxiety and depression   . GERD (gastroesophageal reflux disease)   . Seizure disorder   . Chronic low back pain   . Diverticulitis   . Kidney stones     Past Surgical History  Procedure Date  . Coronary artery bypass graft 2005    LIMA to AL, SVG to LAD, SVG to Cx, SVG-PDA  . Carotid endarterectomy 2005  . Left knee repair   . Right shoulder and elbow repair   . Abdominal hysterectomy     Current Outpatient Prescriptions  Medication Sig Dispense Refill  . ALPRAZolam (XANAX) 1 MG tablet Take 1 mg by mouth at bedtime as needed.        Marland Kitchen aspirin 325 MG tablet Take 325 mg by mouth daily.        . benazepril (LOTENSIN) 20 MG  tablet Take 20 mg by mouth daily.        . carbamazepine (TEGRETOL XR) 200 MG 12 hr tablet Take 200 mg by mouth 2 (two) times daily.        Marland Kitchen esomeprazole (NEXIUM) 40 MG capsule Take 40 mg by mouth daily before breakfast.        . FLUoxetine (PROZAC) 20 MG tablet Take 20 mg by mouth daily.        Marland Kitchen levothyroxine (SYNTHROID, LEVOTHROID) 50 MCG tablet Take 50 mcg by mouth daily.        Marland Kitchen lovastatin (MEVACOR) 40 MG tablet Take 40 mg by mouth 2 (two) times daily.        . metoprolol (TOPROL-XL) 50 MG 24 hr tablet Take 50 mg by mouth daily.        . naproxen sodium (ALEVE) 220 MG tablet Take 220 mg by mouth as needed.        . nitroGLYCERIN (NITROSTAT) 0.4 MG SL tablet Place 0.4 mg under the tongue every 5 (five) minutes as needed.        . prasugrel (EFFIENT) 10 MG TABS Take 10 mg by mouth daily.        . traMADol (ULTRAM) 50 MG tablet Take 1 tablet (50 mg total) by mouth 2 (two) times daily as needed for pain.  60  tablet  1  . zolpidem (AMBIEN) 5 MG tablet Take 1 tablet (5 mg total) by mouth at bedtime as needed for sleep.  60 tablet  0    Allergies as of 01/15/2011  . (No Known Allergies)    Family History  Problem Relation Age of Onset  . Heart attack Mother   . Heart disease Mother   . Heart attack Father   . Heart disease Father   . Colon cancer Neg Hx     History   Social History  . Marital Status: Married    Spouse Name: N/A    Number of Children: 3  . Years of Education: N/A   Occupational History  . disabled    Social History Main Topics  . Smoking status: Former Smoker -- 1.5 packs/day for 15 years    Types: Cigarettes    Quit date: 02/05/2010  . Smokeless tobacco: Never Used  . Alcohol Use: No  . Drug Use: No  . Sexually Active: No    Review of Systems: Gen: Denies fever, chills, anorexia. Denies fatigue, weakness, weight loss.  CV: Denies chest pain, palpitations, syncope, peripheral edema, and claudication. Resp: Denies dyspnea at rest, cough, wheezing,  coughing up blood, and pleurisy. GI: See HPI Derm: Denies rash, itching, dry skin Psych: Denies depression, anxiety, memory loss, confusion. No homicidal or suicidal ideation.  Heme: Denies bruising, bleeding, and enlarged lymph nodes.  Physical Exam: BP 124/80  Pulse 68  Temp 99.3 F (37.4 C)  Ht 5\' 3"  (1.6 m)  Wt 239 lb 12.8 oz (108.773 kg)  BMI 42.48 kg/m2  SpO2 98% General:   Alert and oriented. In obvious discomfort but no distress.  Head:  Normocephalic and atraumatic. Eyes:  Conjuctiva clear without scleral icterus. Mouth:  Oral mucosa pink and moist. Good dentition. No lesions. Heart:  S1, S2 present without murmurs, rubs, or gallops. Regular rate and rhythm. Abdomen: +BS, obese, extremely TTP LLQ. No rebound or guarding. NO HSM. Msk:  Symmetrical without gross deformities. Normal posture. Extremities:  Without edema. Neurologic:  Alert and  oriented x4;  grossly normal neurologically. Skin:  Intact without significant lesions or rashes. Psych:  Alert and cooperative. Normal mood and affect.

## 2011-01-15 NOTE — Patient Instructions (Signed)
Begin the antibiotics prescribed: Cipro and Flagyl. Take as directed, do not skip any doses. Take the full course.  You may take vicodin every four hours as needed for pain.  Watch for fevers greater than 101.5  Call us if increasing abdominal pain.  Follow a low-fiber, low-residue diet to rest your bowel.  We will see you back in a few weeks to schedule an outpatient colonoscopy to investigate your lower GI tract.  We will call you with your CT results tomorrow. In the meantime, you may start your antibiotics.

## 2011-01-15 NOTE — Assessment & Plan Note (Signed)
55 year old Caucasian female with hx of documented diverticulitis in Oct 2011, now with recurrent symptoms likely reflective of repeated acute episode. Afebrile. Will obtain CT abd/pelvis for documentation; will need colonoscopy in 6-8 weeks to assess for any underlying etiology. Ultimately will likely need a surgical referral due to recurrence.   CT abd/pelvis TODAY Cipro/Flagyl rx given to pt Vicodin, short course given to pt Low-fiber diet CBC  F/U in 4 weeks or sooner if needed

## 2011-01-19 ENCOUNTER — Ambulatory Visit (HOSPITAL_COMMUNITY)
Admission: RE | Admit: 2011-01-19 | Discharge: 2011-01-19 | Disposition: A | Discharge status: Home / Self Care | Payer: Medicaid Other | Source: Ambulatory Visit | Attending: Pulmonary Disease | Admitting: Pulmonary Disease

## 2011-01-19 DIAGNOSIS — R0602 Shortness of breath: Secondary | ICD-10-CM | POA: Insufficient documentation

## 2011-01-19 LAB — BLOOD GAS, ARTERIAL
Acid-base deficit: 0.9 mmol/L (ref 0.0–2.0)
Patient temperature: 37
TCO2: 21.1 mmol/L (ref 0–100)
pCO2 arterial: 38.3 mmHg (ref 35.0–45.0)
pH, Arterial: 7.4 (ref 7.350–7.400)

## 2011-01-21 ENCOUNTER — Ambulatory Visit: Payer: Medicaid Other | Attending: Pulmonary Disease

## 2011-01-21 DIAGNOSIS — R5381 Other malaise: Secondary | ICD-10-CM | POA: Insufficient documentation

## 2011-01-21 DIAGNOSIS — R0989 Other specified symptoms and signs involving the circulatory and respiratory systems: Secondary | ICD-10-CM | POA: Insufficient documentation

## 2011-01-21 DIAGNOSIS — R0609 Other forms of dyspnea: Secondary | ICD-10-CM | POA: Insufficient documentation

## 2011-01-21 DIAGNOSIS — R5383 Other fatigue: Secondary | ICD-10-CM | POA: Insufficient documentation

## 2011-01-21 DIAGNOSIS — G4733 Obstructive sleep apnea (adult) (pediatric): Secondary | ICD-10-CM | POA: Insufficient documentation

## 2011-01-25 NOTE — Procedures (Signed)
NAME:  [name], [name]               ACCOUNT NO.:  [id number]  MEDICAL RECORD NO.:  [id number]          PATIENT TYPE:  OUT  LOCATION:  SLEEP LAB                     FACILITY:  [facility]  PHYSICIAN:  Lynzi Meulemans A. Gerilyn Pilgrim, M.D. DATE OF BIRTH:  [birth year]  DATE OF STUDY:                           NOCTURNAL POLYSOMNOGRAM  REFERRING PHYSICIAN:  Edward L. [name], M.D.  INDICATION FOR STUDY:  A 55 year old female who presents with snoring, restless sleep, obesity and fatigue.  The study is being done to evaluate for obstructive sleep apnea syndrome.  EPWORTH SLEEPINESS SCORE: 1. BMI 41.  ARCHITECTURAL SUMMARY:  This is a full night recording.  The total recording time is 364 minutes.  Sleep efficiency is 73%, sleep latency 25 minutes, REM latency 104 minutes.  Stage N1 7.7%, N2 78%, N3  0% and REM sleep 14%  MEDICATIONS:  Alprazolam, Prozac, lovastatin, Aleve, nitroglycerin, Nexium, Synthroid, aspirin, Tegretol, hydrocodone, Zolpidem.  SLEEP ARCHITECTURE:  RESPIRATORY DATA:  Baseline oxygen saturation 97, lowest saturation 80 during REM sleep.  Diagnostic AHI is 14.5 and RDI also 14.5.  LIMB MOVEMENT SUMMARY:  PLM index 1.  ELECTROCARDIOGRAM SUMMARY:  Average heart rate is 51 with no significant dysrhythmias observed.  OXYGEN DATA:  CARDIAC DATA:  MOVEMENT-PARASOMNIA:  IMPRESSIONS-RECOMMENDATIONS:  Mild to moderate obstructive sleep apnea syndrome. Formal CPAP titration recording.  Thanks for this referral.     Sheria Rosello A. Gerilyn Pilgrim, M.D. Electronically Signed 01/25/2011 21:20:44    KAD/MEDQ  D:  01/24/2011 21:16:30  T:  01/25/2011 29:56:21  Job:  [id number]

## 2011-01-27 ENCOUNTER — Encounter: Payer: Self-pay | Admitting: Orthopedic Surgery

## 2011-01-27 ENCOUNTER — Ambulatory Visit: Payer: Self-pay | Admitting: Orthopedic Surgery

## 2011-02-10 ENCOUNTER — Inpatient Hospital Stay (HOSPITAL_COMMUNITY)
Admission: EM | Admit: 2011-02-10 | Discharge: 2011-02-12 | DRG: 292 | Disposition: A | Discharge status: Home / Self Care | Payer: Medicaid Other | Attending: Cardiology | Admitting: Cardiology

## 2011-02-10 ENCOUNTER — Emergency Department (HOSPITAL_COMMUNITY): Payer: Medicaid Other

## 2011-02-10 DIAGNOSIS — R0602 Shortness of breath: Secondary | ICD-10-CM

## 2011-02-10 DIAGNOSIS — Z7982 Long term (current) use of aspirin: Secondary | ICD-10-CM

## 2011-02-10 DIAGNOSIS — I25810 Atherosclerosis of coronary artery bypass graft(s) without angina pectoris: Secondary | ICD-10-CM | POA: Diagnosis present

## 2011-02-10 DIAGNOSIS — Z87891 Personal history of nicotine dependence: Secondary | ICD-10-CM

## 2011-02-10 DIAGNOSIS — I252 Old myocardial infarction: Secondary | ICD-10-CM

## 2011-02-10 DIAGNOSIS — G40909 Epilepsy, unspecified, not intractable, without status epilepticus: Secondary | ICD-10-CM | POA: Diagnosis present

## 2011-02-10 DIAGNOSIS — I2589 Other forms of chronic ischemic heart disease: Secondary | ICD-10-CM | POA: Diagnosis present

## 2011-02-10 DIAGNOSIS — E669 Obesity, unspecified: Secondary | ICD-10-CM | POA: Diagnosis present

## 2011-02-10 DIAGNOSIS — I10 Essential (primary) hypertension: Secondary | ICD-10-CM | POA: Diagnosis present

## 2011-02-10 DIAGNOSIS — Z95810 Presence of automatic (implantable) cardiac defibrillator: Secondary | ICD-10-CM

## 2011-02-10 DIAGNOSIS — G8929 Other chronic pain: Secondary | ICD-10-CM | POA: Diagnosis present

## 2011-02-10 DIAGNOSIS — F411 Generalized anxiety disorder: Secondary | ICD-10-CM | POA: Diagnosis present

## 2011-02-10 DIAGNOSIS — E039 Hypothyroidism, unspecified: Secondary | ICD-10-CM | POA: Diagnosis present

## 2011-02-10 DIAGNOSIS — Z96659 Presence of unspecified artificial knee joint: Secondary | ICD-10-CM

## 2011-02-10 DIAGNOSIS — R079 Chest pain, unspecified: Secondary | ICD-10-CM

## 2011-02-10 DIAGNOSIS — E785 Hyperlipidemia, unspecified: Secondary | ICD-10-CM | POA: Diagnosis present

## 2011-02-10 DIAGNOSIS — I509 Heart failure, unspecified: Principal | ICD-10-CM | POA: Diagnosis present

## 2011-02-10 DIAGNOSIS — G4733 Obstructive sleep apnea (adult) (pediatric): Secondary | ICD-10-CM | POA: Diagnosis present

## 2011-02-10 DIAGNOSIS — K219 Gastro-esophageal reflux disease without esophagitis: Secondary | ICD-10-CM | POA: Diagnosis present

## 2011-02-10 DIAGNOSIS — F1910 Other psychoactive substance abuse, uncomplicated: Secondary | ICD-10-CM | POA: Diagnosis present

## 2011-02-10 DIAGNOSIS — F1210 Cannabis abuse, uncomplicated: Secondary | ICD-10-CM | POA: Diagnosis present

## 2011-02-10 DIAGNOSIS — J4489 Other specified chronic obstructive pulmonary disease: Secondary | ICD-10-CM | POA: Diagnosis present

## 2011-02-10 DIAGNOSIS — Z765 Malingerer [conscious simulation]: Secondary | ICD-10-CM

## 2011-02-10 DIAGNOSIS — I6529 Occlusion and stenosis of unspecified carotid artery: Secondary | ICD-10-CM | POA: Diagnosis present

## 2011-02-10 DIAGNOSIS — Z7902 Long term (current) use of antithrombotics/antiplatelets: Secondary | ICD-10-CM

## 2011-02-10 DIAGNOSIS — J449 Chronic obstructive pulmonary disease, unspecified: Secondary | ICD-10-CM | POA: Diagnosis present

## 2011-02-10 DIAGNOSIS — Z79899 Other long term (current) drug therapy: Secondary | ICD-10-CM

## 2011-02-10 DIAGNOSIS — Z6841 Body Mass Index (BMI) 40.0 and over, adult: Secondary | ICD-10-CM

## 2011-02-10 DIAGNOSIS — Z9861 Coronary angioplasty status: Secondary | ICD-10-CM

## 2011-02-10 DIAGNOSIS — I658 Occlusion and stenosis of other precerebral arteries: Secondary | ICD-10-CM | POA: Diagnosis present

## 2011-02-10 LAB — PROTIME-INR
INR: 1.04 (ref 0.00–1.49)
Prothrombin Time: 13.8 seconds (ref 11.6–15.2)

## 2011-02-10 LAB — HEPATIC FUNCTION PANEL
ALT: 38 U/L — ABNORMAL HIGH (ref 0–35)
AST: 28 U/L (ref 0–37)
Bilirubin, Direct: 0.1 mg/dL (ref 0.0–0.3)
Total Protein: 7.3 g/dL (ref 6.0–8.3)

## 2011-02-10 LAB — CBC
MCH: 30.4 pg (ref 26.0–34.0)
MCHC: 33.2 g/dL (ref 30.0–36.0)
MCV: 91.5 fL (ref 78.0–100.0)
Platelets: 218 10*3/uL (ref 150–400)
RBC: 3.65 MIL/uL — ABNORMAL LOW (ref 3.87–5.11)
RDW: 13.4 % (ref 11.5–15.5)

## 2011-02-10 LAB — CARDIAC PANEL(CRET KIN+CKTOT+MB+TROPI)
CK, MB: 2.9 ng/mL (ref 0.3–4.0)
Relative Index: 2.5 (ref 0.0–2.5)

## 2011-02-10 LAB — POCT CARDIAC MARKERS

## 2011-02-10 LAB — BASIC METABOLIC PANEL
BUN: 15 mg/dL (ref 6–23)
CO2: 25 mEq/L (ref 19–32)
Chloride: 102 mEq/L (ref 96–112)
Creatinine, Ser: 0.64 mg/dL (ref 0.4–1.2)
Glucose, Bld: 124 mg/dL — ABNORMAL HIGH (ref 70–99)
Potassium: 3.8 mEq/L (ref 3.5–5.1)

## 2011-02-10 LAB — PRO B NATRIURETIC PEPTIDE: Pro B Natriuretic peptide (BNP): 2796 pg/mL — ABNORMAL HIGH (ref 0–125)

## 2011-02-10 LAB — APTT: aPTT: 33 seconds (ref 24–37)

## 2011-02-11 LAB — CARDIAC PANEL(CRET KIN+CKTOT+MB+TROPI)
CK, MB: 2.2 ng/mL (ref 0.3–4.0)
Relative Index: 2 (ref 0.0–2.5)
Total CK: 112 U/L (ref 7–177)
Troponin I: <0.3 ng/mL (ref <0.30)

## 2011-02-11 LAB — BASIC METABOLIC PANEL
Chloride: 100 mEq/L (ref 96–112)
GFR calc Af Amer: >60 mL/min (ref >60)
GFR calc non Af Amer: >60 mL/min (ref >60)
Potassium: 4 mEq/L (ref 3.5–5.1)
Sodium: 138 mEq/L (ref 135–145)

## 2011-02-11 LAB — CBC
HCT: 33.3 % — ABNORMAL LOW (ref 36.0–46.0)
MCH: 30.1 pg (ref 26.0–34.0)
MCV: 91 fL (ref 78.0–100.0)
RBC: 3.66 MIL/uL — ABNORMAL LOW (ref 3.87–5.11)
WBC: 6 10*3/uL (ref 4.0–10.5)

## 2011-02-11 LAB — DIFFERENTIAL
Eosinophils Relative: 2 % (ref 0–5)
Lymphocytes Relative: 19 % (ref 12–46)
Lymphs Abs: 1.2 10*3/uL (ref 0.7–4.0)
Monocytes Relative: 7 % (ref 3–12)
Neutrophils Relative %: 72 % (ref 43–77)

## 2011-02-11 LAB — GLUCOSE, CAPILLARY: Glucose-Capillary: 114 mg/dL — ABNORMAL HIGH (ref 70–99)

## 2011-02-12 ENCOUNTER — Telehealth: Payer: Self-pay | Admitting: Cardiology

## 2011-02-12 ENCOUNTER — Encounter: Payer: Self-pay | Admitting: Gastroenterology

## 2011-02-12 NOTE — Telephone Encounter (Signed)
Will discuss with Dr. Daleen Squibb on 02/13/11. Mylo Red RN

## 2011-02-12 NOTE — Progress Notes (Signed)
Pt needs TCS when diverticulitis resolved. 2 episodes in the [place].

## 2011-02-12 NOTE — Telephone Encounter (Signed)
Xanax 1mg  q6h per pt call Pharm will not fill the Rx and she just left the hospital and Dr. Daleen Squibb told her she could get her Rx filled today. Pt also called to [place] office and was told to call to [facility] because it was nothing they could do. I called the [place] office and spoke with Aurther Loft and she said there was no since in sending the patient to her because Dr. Daleen Squibb was in [place] office and pt just left [facility] so what ever Lela. I said never mind. Pt needs Rx today. pls call into [facility]. In [place] [phone]

## 2011-02-13 NOTE — Telephone Encounter (Signed)
I spoke with patient and she had found her bottle of xanax yesterday evening.  She would like Dr. Daleen Squibb to know how apologetic she is over being upset.  Reassurance given to patient. She understands why we could not refill script at this time.  She does have enough to last her until new prescription can be filled. Mylo Red RN

## 2011-02-17 NOTE — H&P (Signed)
NAME:  [name], [name]               ACCOUNT NO.:  [id number]   MEDICAL RECORD NO.:  [id number]          PATIENT TYPE:  INP   LOCATION:  IC05                          FACILITY:  [facility]   PHYSICIAN:  Lonia Blood, M.D.      DATE OF BIRTH:  1956/07/13   DATE OF ADMISSION:  04/17/2009  DATE OF DISCHARGE:  [facility]                              HISTORY & PHYSICAL   PRIMARY CARE PHYSICIAN:  The patient is unassigned to [place].  She goes to  health department.   PRESENTING COMPLAINT:  Left lower quadrant abdominal pain.   HISTORY OF PRESENT ILLNESS:  The patient is a 55 year old female with  history of coronary artery disease and hypertension, who has apparently  been having left lower quadrant abdominal pain on and off for the past 3  months.  She has been going to the health department and was told that  she had either diverticulitis or kidney stones.  Pain got worse last  night and the patient decided to come to the emergency room this  morning.  She denied any nausea or vomiting.  Her pain is rated as 6-  7/10, worse with movement and relieved with rest.  She has had some mild  fever on and off.  No constipation.  No hematochezia.  She has not had a  colonoscopy.   PAST MEDICAL HISTORY:  1. Significant for coronary artery disease.  2. Hypertension.  3. Chronic back pain.  4. Depression.  5. Anxiety.   ALLERGIES:  Itches to MORPHINE.   MEDICATIONS:  1. Benazepril 20 mg daily.  2. Toprol XL 50 mg daily.  3. Plavix 75 mg daily.  4. Xanax 0.5 mg p.r.n.  5. Prozac 40 mg daily.  6. Aspirin 81 mg daily.   SOCIAL HISTORY:  The patient is married, lives in [place].  She  smokes half a pack per day but quit about 10 days ago.  Denied any  alcohol or IV drug use.   FAMILY HISTORY:  Significant for hypertension.   PAST SURGICAL HISTORY:  Status post hysterectomy and shoulder surgery.   REVIEW OF SYSTEMS:  Fourteen-point review of systems negative except per  HPI.   PHYSICAL EXAMINATION:   Temperature 98.4, blood pressure initially  128/71, down to 87/50 with medications, pulse 57, respiratory rate 16,  saturation is 99% on room air.  GENERAL:  The patient is awake, alert, oriented, obese woman in no acute  distress.  HEENT:  PERRL.  EOMI.  NECK:  Supple.  No JVD, no lymphadenopathy.  RESPIRATORY:  Shows good air entry bilaterally.  No wheezes or rales.  CARDIOVASCULAR SYSTEM:  She has S1, S2, no murmur.  ABDOMEN:  Obese, soft, with mild left lower quadrant tenderness and  positive bowel sounds.  EXTREMITIES:  No edema, cyanosis or clubbing.   LABORATORY DATA:  White count 13.2 with left shift, ANC of 10.4.  Hemoglobin 11.6, platelet count of 284.  Sodium 136, potassium 4.1,  chloride 102, CO2 23, glucose 124, BUN 16, creatinine 0.86.  LFTs  essentially normal.  Lipase of 18.  CT abdomen and pelvis  without  contrast is consistent with tiny bilateral intrarenal calculi and  hydronephrosis.  Also evidence of left-sided diverticulitis, findings  consistent with moderate sigmoid diverticulitis.   ASSESSMENT:  Therefore, this is a 55 year old female presenting with  moderate sigmoid diverticulitis.  This has been going on and seems to be  acute on chronic.  The patient also has coronary artery disease,  hypertension, anxiety disorder, depression, leukocytosis, anemia and  recent tobacco abuse.   PLAN:  1. Acute sigmoid diverticulitis.  We will admit the patient.  Mild      bowel rest except for clear liquids.  Pain control.  Control her      nausea and vomiting.  Will get GI consult, non-urgent.  Once she is      able to eat and drink and her need for pain medicine subsides, we      will put her back on full diet.  The patient has been counseled on      high-fiber diet and avoidance of diet containing nuts etc.  2. Coronary artery disease.  I will continue with her Plavix and check      cardiac exams.  3. Hypertension.  Her blood pressure has dropped some but for the  most      part, she is fully controlled.  I will hold her medications for now      and restart them as necessary.  4. Anxiety/depression.  I will start some Ativan as needed.  5. Leukocytosis.  This is most likely due to her diverticular disease      and diverticulitis.  We will follow closely.  6. Anemia.  H and H seems to be relatively okay.  We will follow her      hemoglobin closely.   Other than that, further treatment will depend on how the patient does  to initial measures.      Lonia Blood, M.D.  Electronically Signed     LG/MEDQ  D:  04/17/2009  T:  04/17/2009  Job:  [id number]

## 2011-02-17 NOTE — Assessment & Plan Note (Signed)
[facility]                       [facility] OFFICE NOTE   NAME:[name]                      MRN:          [id number]  DATE:06/24/2009                            DOB:          [birth year]    Ms. Gulyas returns today after being hospitalized with acute  diverticulitis, dehydration, and anemia.  Please refer to the [facility]  hospital discharge summary by Dr. Rito Ehrlich for details on e-chart.   She has a known history of coronary artery disease status post coronary  artery bypass grafting in 2005.  I took care of her at that time  according to her.   She has a history of normal left ventricular systolic function.   Dr. Dietrich Pates who saw her in consultation in the hospital notes that she  had some nonobstructive carotid disease in the past.  I cannot find this  in the e-chart though it may be when she had her bypass surgery.   Her risk factors for cardiac disease include hyperlipidemia; obesity;  sedentary lifestyle; remote tobacco use, which she quit recently.  She  is also used to crack cocaine in the past, but not recently.   Her hospitalization was complicated by volume overload with pulmonary  congestion.  She was diuresed about 7 L and improved.  A 2-D  echocardiogram showed an EF of 50-55% with no significant valvular  abnormality.  During hospitalization, her BNP went to the 1500s.  It  came down appropriately with diuresis.   Since discharge, she has been on aggressive weight loss program.  She  has gone up from 270 to 242.  She is quite pleased with this.   She denies any chest pain or dyspnea since discharge.  She has had no  symptoms of TIAs or mini strokes.   MEDICATIONS:  She is currently on;  1. Levothroid 25 mcg once a day.  2. Alprazolam 1 mg as needed p.r.n.  3. Prozac 40 mg a day.  4. Lovastatin 20 mg at bedtime.  5. Plavix 75 mg a day.  6. Benazepril 40 mg a day.  7. Toprol-XL 50 mg a day.   She seems to  be very compliant with her medications.   PHYSICAL EXAMINATION:  VITAL SIGNS:  Her blood pressure today is 146/81,  her pulse is 60 and regular.  She is 64 inches and weighs 242 pounds.  BMI is 41.69.  Sats 96% on room air.  HEENT:  Essentially normal.  NECK:  Carotid upstrokes are equal bilaterally without obvious bruits.  Thyroid is not enlarged.  Trachea is midline.  Neck is supple.  LUNGS:  Clear to auscultation and percussion.  HEART:  Nondisplaced PMI, normal S1 and S2.  No gallop.  ABDOMEN:  Soft, good bowel sounds.  Organomegaly cannot be assessed.  EXTREMITIES:  No cyanosis, clubbing or edema.  Pulses are present.  NEURO:  Intact.  She is alert and oriented x3.  She is quite humorous.   I went back and looked for her carotid Dopplers, but could not find  them.  It must have been done at the time of her bypass  surgery.   I went back and listened to her neck and she may have a soft systolic  sound at the right base.   ASSESSMENT AND PLAN:  [name] is doing well.  We will obtain fasting  lipids, comprehensive metabolic panel, and also carotid Dopplers.  Assuming she is stable, I will see her back in December.      Thomas C. Daleen Squibb, MD, [facility]  Electronically Signed    TCW/MedQ  DD: 06/24/2009  DT: 06/25/2009  Job #: [id number]   cc:   [facility]

## 2011-02-17 NOTE — Consult Note (Signed)
NAMEMALA, GIBBARD               ACCOUNT NO.:  [id number]   MEDICAL RECORD NO.:  [id number]          PATIENT TYPE:  INP   LOCATION:  A311                          FACILITY:  [facility]   PHYSICIAN:  Gerrit Friends. Dietrich Pates, MD, FACCDATE OF BIRTH:  12/29/1955   DATE OF CONSULTATION:  04/22/2009  DATE OF DISCHARGE:                                 CONSULTATION   PRIMARY CARE PHYSICIAN:  [facility].   PRIMARY CARDIOLOGIST:  Gerrit Friends. Dietrich Pates, MD, [facility]   HISTORY OF PRESENT ILLNESS:  A 55 year old woman with known coronary  artery disease having undergone CABG surgery in 2005, now referred for  the new onset of congestive heart failure.  Ms. Bosak was first seen by  our group in 2005 with chest discomfort consistent with angina.  She had  a history of hypertension, but was not compliant with her medications.  She has been a cigarette smoker.  She underwent cardiac catheterization  revealing three-vessel coronary artery disease for which she was  referred to CABG surgery, which was uncomplicated.  She subsequently did  well from a cardiac standpoint, but did not return to the office for  followup.  She was seen in 2007 after presenting with acute  cholecystitis.  Permission was given for cholecystectomy, which was  uncomplicated.   She again did well until this admission when she presented with acute  diverticulitis.  She was treated with antibiotics with improvement.  She  also received substantial amounts of intravenous hydration including 7  liters over the course of one 24-hour period.  She subsequently  developed dyspnea and was found to have pulmonary edema on chest x-ray.  She has improved after initial treatment with furosemide.   PAST MEDICAL HISTORY:  Otherwise notable for seizure disorder, GERD,  anxiety, and cerebrovascular disease.  Her last available carotid  ultrasound showed a 60-80% right ICA stenosis and 40-60% narrowing on  the left.   PAST SURGICAL  HISTORY:  Appendectomy, tubal ligation, and left knee  laparoscopic ACL repair.  Elbow surgery in 1999.  Right carpel tunnel  release in 1999 and cholecystectomy in 2007.   ALLERGIES:  An allergy to MORPHINE is reported, which resulted in  pruritus.   MEDICATIONS ON ADMISSION:  1. Benazepril 20 mg daily.  2. Metoprolol 50 mg daily.  3. Clopidogrel 75 mg daily.  4. Xanax 0.5 mg p.r.n.  5. Prozac 40 mg daily.  6. Aspirin 81 mg daily.   SOCIAL HISTORY:  Married and lives locally; previously employed as a  [employer]; 3 children by previous marriage; 30 pack-year history of  cigarette smoking that continued to one-half packet per day until about  2 weeks ago - she has been abstinent since.  She has had moderate  alcohol intake in the past, but denies any excessive use.  She has used  marijuana and crack cocaine in the past, but not recently.   FAMILY HISTORY:  Mother died at age 49 due to myocardial infarction;  father died at age 34, also with coronary artery disease.  Of six  siblings, none have coronary artery disease.  REVIEW OF SYSTEMS:  She reports a history of hyperlipidemia, asthma, and  COPD.  She has class II dyspnea on exertion.  She has a history of  headache.  She has suffered falls in the past.  She describes arthritic  discomfort.  She follows a regular diet at home.  She has had blood  transfusions in the past, but these were quite remote.  She requires  corrective lenses for near vision.  She has a history of depression and  anxiety, and has been diagnosed as bipolar in the past.  She uses  hypnotics for insomnia.  All other systems reviewed and are negative.   PHYSICAL EXAMINATION:  GENERAL:  A dyspneic, overweight woman with a  face mask in mild respiratory distress.  VITAL SIGNS:  The temperature is 98.9, heart rate 72 and regular,  respirations 16, blood pressure 145/85, and O2 saturation was 85% this  morning on 4 liters of nasal O2.  HEENT:  Anicteric  sclerae; normal oral mucosa.  NECK:  Mild jugular venous distention; normal carotid upstrokes without  bruits.  ENDOCRINE:  No thyromegaly.  HEMATOPOIETIC:  No adenopathy.  LUNGS:  Few expiratory rhonchi; no rales appreciated.  CARDIAC:  Normal first and second heart sounds; fourth heart sound  present.  ABDOMEN:  Soft and nontender; reduced bowel sounds; no organomegaly.  EXTREMITIES:  No edema; normal distal pulses.  NEUROLOGIC:  Symmetric strength and tone; normal cranial nerves.  SKIN:  No significant lesions.   EKG is not available for review.  Chest x-ray reviewed and shows  pulmonary edema.  Basic laboratory notable for hemoglobin of 9.2 with a  normal MCV, glucose values of around 150, normal electrolytes, normal  renal function, mildly abnormal LFTs with slightly elevated  transaminases, normal lipase.  Abnormal cardiac markers with a normal  CPK, normal CPK-MB but mildly elevated troponins 0.37 and 0.47.   IMPRESSION:  Ms. Zorn has developed pulmonary edema in the setting of  fairly substantial intravenous hydration.  We will further define  exactly how much fluid she has received.  Urine output has not been  measured - we will try to assure that this occurs.  Furosemide will be  continued.  Daily metabolic profiles will be obtained.  An  echocardiogram has been requested to verify that she continues to have  normal left ventricular systolic function, which was present in 2005.  Cardiac markers will be completed, but I suspect that her mildly  elevated troponin is related to congestive heart failure.  A BNP level  is pending.  We appreciate the request for consultation.  We will be  happy to follow this nice woman with you.      Gerrit Friends. Dietrich Pates, MD, [facility]  Electronically Signed     RMR/MEDQ  D:  04/22/2009  T:  04/23/2009  Job:  [id number]

## 2011-02-17 NOTE — Discharge Summary (Signed)
[name], [name]               ACCOUNT NO.:  [id number]   MEDICAL RECORD NO.:  [id number]          PATIENT TYPE:  INP   LOCATION:  A311                          FACILITY:  [facility]   PHYSICIAN:  Toni Shipper, MD     DATE OF BIRTH:  [birth year]   DATE OF ADMISSION:  04/17/2009  DATE OF DISCHARGE:  07/23/2010LH                               DISCHARGE SUMMARY   PRIMARY MEDICAL DOCTOR:  She goes to the Health Department.   CONSULTATIONS:  During this hospitalization, the patient was seen in  consultation by Gastroenterology as well as by Dr. Dietrich [name] From [facility]  Cardiology.   HISTORY OF PRESENT ILLNESS:  Please review H&P dictated by Dr. Mikeal [name] for  details regarding the patient's presenting illness.   DISCHARGE DIAGNOSES:  1. Acute diverticulitis, improved.  2. Pulmonary edema secondary to aggressive IV hydration, improved.  3. History of coronary artery disease status post CABG, stable.  4. Hypothyroidism, now on Synthroid.  5. Anemia status post one unit transfusion, stable.   BRIEF HOSPITAL COURSE:  Briefly, this is a 55 year old of Caucasian  female who presented to the hospital complains of left lower quadrant  abdominal pain.  She was evaluated initially in the emergency department  and underwent a CT scan of her abdomen and pelvis which showed bilateral  infrarenal calculi without hydronephrosis.  However, she was also found  to have moderate sigmoid diverticulitis without an abscess or any other  complicating features.  The patient was put initially on Cipro and  Flagyl which was changed over to Unasyn.  She seemed to be progressing  quite well.  However, she suddenly developed shortness of breath.  Chest  x-ray was done which showed cardiomegaly with congestive heart failure  and interstitial edema.  It was felt that the patient was aggressively  IV hydrated with IV fluids and hence she was checked in to fluid  overload.  The patient was started on diuretics.  Her  cardiac enzymes  were mildly elevated.  This prompted an echocardiogram which showed EF  to be 50-55% with moderate hypokinesis of the small segment of the  basilar and mid inferolateral myocardium.  No other significant  abnormalities were found.  There was a small right-sided pleural  effusion.  The patient's BNP was also elevated in the 1500s.  The  patient was aggressively diuresed for the next 3-4 days and she has done  exceedingly well.  She has lost almost 7 kg since she has been started  on diuresis.  The patient had been ambulating without much difficulty.  She is saturating 92% on room air.  She is feeling well and she is ready  for discharge.   GI will arrange outpatient follow-up for colonoscopy in the next few  weeks.  Cardiology will also arrange outpatient follow-up.   Hypothyroidism.  During this course of admission, she was found to have  a TSH of greater than 20.  Upon further query, it appears that she was  diagnosed with this many years ago, but when she started taking  Synthroid, she experienced hair loss which is unusual.  Repeat TSH was  8.8 with a low T4, and so I think at this point she needs to be treated.  I will write a prescription for this medication, and if she experiences  side effects again, she needs to talk to her primary [name].   Anemia.  She was also anemic.  Hemoglobin dropped down to about 9.1.  Dr. Diona [name] from [facility] recommended transfusing one unit  because of her history of CAD.  So she was transfused and hemoglobin has  come up to 10.4.   We will recommend outpatient diuresis for the next few days with close  follow-up with Cardiology and GI.   CONDITION ON DISCHARGE:  On the day of discharge, the patient is feeling  well, without any complaints.  She is very keen on going home.  Denies  any chest pain, shortness of breath, nausea, vomiting.   DISCHARGE PHYSICAL EXAMINATION:  VITAL SIGNS:  Vital signs are all  stable,  blood pressures is 142/89, saturation 92% on room air.  LUNGS:  Much clear to auscultation with only a few crackles at the  bases.  No wheezing is present.  ABDOMEN:  Soft, nontender, nondistended.  Bowel sounds are present.  No  masses or organomegaly is appreciated.  No pedal edema is present.  She  is an obese lady   DISCHARGE MEDICATIONS:  1. Levothyroxine 25 mcg once daily.  2. Furosemide 40 mg once daily for 5 days.  3. Potassium chloride 48 mEq once daily for 5 days.  Otherwise, she may continue her home medications which include the  following:  A.  Xanax 1 mg four times daily as needed.  B.  Prozac 40 mg once daily.  C.  Lovastatin 20 mg daily.  D.  Plavix 75 mg daily.  E.  Benazepril 40 mg daily.  F.  Toprol XL 50 mg daily.  G.  Aspirin 81 mg daily.   FOLLOWUP:  1  Follow up with Dr. Dietrich [name], they will schedule an  appointment.  1. GI, they will set up appointment.   DISCHARGE INSTRUCTIONS:  1. Diet.  High-fiber heart-healthy diet.  2. Physical activity.  Increase activity slowly.  3  The patient instructed to check her weight every 2-3 days to make  sure she is not getting any fluid.   STUDIES:  Apart from the imaging studies discussed above, which includes  CT abdomen, pelvis and chest x-ray.  She also had an ultrasound of the  abdomen which was quite unremarkable and technically limited because of  her obesity.   Total time on this discharge encounter 35 minutes.      Toni Shipper, MD  Electronically Signed     GK/MEDQ  D:  04/26/2009  T:  04/26/2009  Job:  [id number]   cc:   [name]. Toni Pates, MD, [facility]  [address]  [place], [place] [address]   [name], M.D.  [address]  [place] , [place] [address]   Health Department

## 2011-02-17 NOTE — Group Therapy Note (Signed)
NAME:  [name], [name]               ACCOUNT NO.:  [id number]   MEDICAL RECORD NO.:  [id number]          PATIENT TYPE:  INP   LOCATION:  A311                          FACILITY:  [facility]   PHYSICIAN:  Lonia Blood, M.D.      DATE OF BIRTH:  1956/01/06   DATE OF PROCEDURE:  DATE OF DISCHARGE:                                 PROGRESS NOTE   [name] today has improved some over yesterday.  In the last 24 hours,  she has had decompensation with shortness of breath that presumed due to  pulmonary edema.  Last night, she experienced chest pain for which she  has been on nitro drip today.  Her symptoms has since improved and she  said she feels much better now, but she is still on oxygen.  She has  been on as high as of 10 L of oxygen earlier today, but currently on  only 5 L.   PHYSICAL EXAMINATION:  VITAL SIGNS:  Her temperature is 98, blood  pressure 96/55, pulse 62, respiratory 18, and her sat is 100% on 5 L.  GENERAL:  She is awake, alert, oriented, morbidly obese woman in no  acute distress.  HEENT:  PERRLA.  EOMI.  NECK:  Supple.  No JVD.  No lymphadenopathy.  RESPIRATORY:  She has good air entry bilaterally.  No wheezes or rales.  CARDIOVASCULAR:  The patient has S1 and S2.  No murmur.  ABDOMEN:  Soft and nontender with positive bowel sounds.  EXTREMITIES:  No edema, cyanosis, or clubbing.   LABORATORY DATA:  Today, CK of 149, MB of 2.4 with troponin of 0.39.  Sodium 140, potassium 2.5, chloride 93, CO2 40, glucose 124, BUN 7,  creatinine 0.78, and calcium 8.4.  Her BNP was 1560.   ASSESSMENT:  1. Pulmonary edema.  This seems to be improving somewhat.  Her intake      and output showed that she was -1200 mL on the 19th and so far on      the 20th, she is -1160.  Hence, the patient is getting adequately      diuresed.  Previously, she was positive about 3 L.  This diuresis      is helping her to recover better.  2. Possible myocardial infarction.  With the patient's chest pain, she      has been on nitro, but this could all be secondary to pulmonary      edema.  We will defer to Cardiology, Dr. Dietrich Pates for further      advice.  3. Diverticulitis.  This seems to be improving also clinically.  She      still remained nauseated, however.  So, she is still on only liquid      diet.  Further adjustment will be made accordingly.  4. Morbid obesity.  The patient effectively counseled.  5. Anxiety disorder.  This is probably contributing to the patient's      symptoms.  We will continue with her      medication as necessary.  6. Anemia.  Seems to be stable also at this  point.  7. Hypokalemia, profound.  We will continue to replete and increase      magnesium as necessary.      Lonia Blood, M.D.  Electronically Signed     LG/MEDQ  D:  04/23/2009  T:  04/24/2009  Job:  [id number]

## 2011-02-17 NOTE — Consult Note (Signed)
NAME:  [name], [name]               ACCOUNT NO.:  [id number]   MEDICAL RECORD NO.:  [id number]          PATIENT TYPE:  INP   LOCATION:  IC05                          FACILITY:  [facility]   PHYSICIAN:  R. Roetta Sessions, M.D. DATE OF BIRTH:  [birth year]   DATE OF CONSULTATION:  04/17/2009  DATE OF DISCHARGE:                                 CONSULTATION   REASON FOR CONSULTATION:  Recurrent diverticulitis.   HISTORY OF PRESENT ILLNESS:  [name] is a very pleasant 55 year old  lady who has had a 74-month history of stuttering left lower quadrant  abdominal pain.  Over the past couple of days, the symptoms have  worsened.  She saw her primary care Masami Plata (the health department) and  felt she either had diverticulitis or kidney stones.  She came to the  emergency department this morning where she was evaluated.  CT of the  abdomen and pelvis demonstrated inflammatory changes consistent with  sigmoid diverticulitis without free air or abscess.  She does have tiny  bilateral nonobstructing=nitroglycerin kidney stones.   LABORATORY EVALUATION:  Included a CBC, which revealed a white count of  13,000.  Lipase was normal.  LFTs were normal.  She has been admitted to  the hospital and started on IV Cipro and Flagyl.   [name] tells me that she has had long-standing recurrent left-sided  abdominal pain.  She had a similar severe bout back in August of last  year, for which a CT scan was performed here.  I have reviewed both the  current CT and the prior CT with Dr. Candee Furbish of the radiology  department.  She does have compelling evidence for sigmoid  diverticulitis at this time.  Back in August of last year, she also had  diverticulitis but this clearly involved more of the descending segment  rather than the sigmoid segment.   [name] tells me that she has had to lesser degree abdominal pain over  the past couple of years.  She denies hematochezia, melena,  constipation, or diarrhea.  There  was no family history of colorectal  neoplasia, polyps, or inflammatory bowel disease.  She has never had her  lower GI tract imaged in terms of colon cancer screening including a  colonoscopy.   PAST MEDICAL HISTORY:  Significant for coronary artery disease.  She is  status post 5-vessel CABG 5 years ago.  History of hypertension.  Chronic low back pain.  Depression.  Anxiety.  Neurosis.  She denies any  history of upper GI tract diseases such as peptic ulcer disease or  gastroesophageal reflux disease.   MEDICATIONS ON ADMISSION:  1. Benazepril 20 mg daily.  2. Metoprolol XL 50 mg daily.  3. Plavix 75 mg daily.  4. Xanax 0.5 mg p.r.n.  5. Prozac 40 mg daily.  6. Aspirin 81 mg daily.   ALLERGIES:  MORPHINE causes pruritus.   FAMILY HISTORY:  Significant for hypertension.  Mother had what sounds  like metastatic breast cancer.  No history of chronic GI or liver  illness.   SOCIAL HISTORY:  The patient is married.  She lives in [place].  She  has not consumed any alcohol in the past 2 years.  No illicit/illegal  drug use.  She does have a long-standing history of smoking, about 1/2  pack of cigarettes per day.   PAST SURGICAL HISTORY:  CABG status post hysterectomy and shoulder  surgery.   REVIEW OF SYSTEMS:  No recent chest pain, dyspnea on exertion.  She has  not had any fevers or chills.  No odynophagia, dysphagia, or  gastroesophageal reflux symptoms, nausea or vomiting.  She denies weight  loss recently.   PHYSICAL EXAMINATION:  CONSTITUTIONAL:  She is in ICU bed 5 (overflow  patient).  The patient is alert, conversant, in no acute distress.  Rates pain currently 4/10 to 5/10 after her 1 mg Dilaudid.  VITAL SIGNS:  Temperature 98.4, BP 128/71, pulse 57, respirations 16.  SKIN:  Warm and dry.  There is no jaundice.  No cutaneous stigmata of  chronic liver disease.  HEENT:  No scleral icterus.  Conjunctivae pink.  CHEST AND LUNGS:  Clear to auscultation.  CARDIAC:   Regular rate and rhythm without murmur, gallop, or rub.  BREASTS:  Deferred.  ABDOMEN:  Obese.  Positive bowel sounds.  Soft.  She has localized left  lower quadrant tenderness to palpation.  No appreciable mass or  organomegaly.  There is no guarding or rebound tenderness.  EXTREMITIES:  No edema.   LABORATORY DATA:  Lipase 18, sodium 136, potassium 4.1, chloride 102,  CO2 23, glucose 124, BUN 16, creatinine 0.86.  Total bilirubin 0.6,  alkaline phosphatase 71, AST 27, ALT 23, total protein 7.3, albumin 4.0,  calcium 9.4.  Urinalysis is negative.  Cardiac panel, CK 82, troponin I  0.04.   IMPRESSION:  [name] is a pleasant 55 year old lady  admitted to the hospital with recurrent diverticulitis.  She has  convincing evidence of sigmoid diverticulitis without complicating  factors on the CT performed earlier today.  She has had stuttering  symptoms over the past several months and presented here with acute left-  sided abdominal pain last summer, and at that time, CT also documented  diverticulitis, but it appeared to be in the descending colon with  sparing of the sigmoid segment.   RECOMMENDATIONS:  1. Agree with instituting IV Cipro and Flagyl, as you have done, and      holding her on a clear liquid diet for now.  2. Hopefully, the diverticulitis will resolve uneventfully over the      next several days.  3. I would recommend that she have her first ever outpatient screening      colonoscopy in the near future.  Once she is over her      diverticulitis, subsequent to colonoscopy, I recommend she get an      elective surgical consultation for      an extended left hemicolectomy film her descending and sigmoid      segments will likely need to be removed to hopefully eliminate      future bouts of diverticulitis.   I would like to thank the hospitalist for allowing me to see this nice  lady today.      Jonathon Bellows, M.D.  Electronically Signed      RMR/MEDQ  D:  04/17/2009  T:  04/17/2009  Job:  [id number]   cc:   Health Department   Hospitalist Team

## 2011-02-18 ENCOUNTER — Encounter: Payer: Medicaid Other | Admitting: Adult Health
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
## 2011-02-20 NOTE — Op Note (Signed)
[name], [name]               ACCOUNT NO.:  [id number]   MEDICAL RECORD NO.:  [id number]          PATIENT TYPE:  INP   LOCATION:  A336                          FACILITY:  [facility]   PHYSICIAN:  Barbaraann Barthel, M.D. DATE OF BIRTH:  07-13-1956   DATE OF PROCEDURE:  07/30/2006  DATE OF DISCHARGE:                                 OPERATIVE REPORT   PREOPERATIVE DIAGNOSIS:  Acute cholecystectomy secondary to cholelithiasis.   POSTOPERATIVE DIAGNOSIS:  Acute cholecystitis secondary to cholelithiasis.   PROCEDURE:  Laparoscopic cholecystectomy.   Note, this is a 55 year old white female who came into the emergency room  with signs and symptoms of right upper quadrant pain and nausea and vomiting  with a sonogram revealing cholelithiasis with minimal gallbladder  thickening; however, the patient was considerably tender and had a positive  Murphy's sign.  Liver function studies and lipase and amylase were not  elevated.  We obtained a cardiology consult on her preoperatively and when  she was cleared for surgery, we took her to surgery today on July 30, 2006.   GROSS OPERATIVE FINDINGS:  The patient had an acutely inflamed gallbladder  with hydrops of the gallbladder and considerable edema of the gallbladder.  This was aspirated with clear white bile aspirated from needle aspiration,  and we aspirated approximately 60 mL from the gallbladder.  A small cystic  duct which was not cannulated for cholangiogram.  Considerable edema, as  mentioned of the gallbladder.  Otherwise, the right upper quadrant appeared  to be grossly within normal limits.   SPECIMEN:  Gallbladder with stones.   TECHNIQUE:  The patient was placed in the supine position.  After the  adequate administration of general anesthesia via endotracheal intubation,  the entire abdomen was prepped with Betadine solution and draped in the  usual manner.  Prior to this, a Foley catheter was aseptically inserted with  the  patient in Trendelenburg.  A periumbilical incision was carried out over  the superior aspect of the umbilicus, and then the fascia was grasped with a  sharp towel clip and elevated, and a Veress needle was inserted, confirmed  the position with a saline drop test.  Approximately 3.6 liters of CO2 was  then infused.  We then used the Visiport technique, and an 11-mm cannula was  placed in the epigastrium and two 5-mm cannulas were placed the right upper  quadrant laterally under direct vision as was an 11-mm cannula placed in the  epigastrium.  The gallbladder was grasped.  It was decompressed using the  needle decompressive device of approximately 60 mL of white bile.  We then  grasped the gallbladder, took down its adhesions.  Edema helped [place] somewhat.  We were able to clearly visualize the cystic duct.  This was triply silver-  clipped on the side of the common bile duct and singly silver-clipped on the  side of the gallbladder and divided.  The cystic artery was likewise clearly  identified, triply silver-clipped and divided.  The gallbladder was then  removed using the hook cautery device.  There was considerable oozing  because  of the edema of the gallbladder in the plane between the gallbladder  and the liver was somewhat effaced.  We irrigated and removed this with the  cautery device.  There was some spillage towards the end of this.  This was  sucked out with a suction device, and all the stones were gathered.  There  were approximately 4-5 stones about the size of dice, and these were placed  in the retrieval bag.  We removed all stones,  and this was then removed  through the epigastric incision.  We then irrigated with normal saline  solution, controlled the bleeding with a cautery device, and then I elected  to leave 2 pieces of Surgicel within the liver bed and a Jackson-Pratt drain  which exited through one of the 5-mm cannula sites laterally.  We then  desufflated the  abdomen, closed the fascia in the area of the epigastrium  and the umbilicus with 0 Polysorb and closed all incisions with stapling  device.  I elected to use 0.5% Sensorcaine to infiltrate the port sites for  postoperative comfort.  Prior to closure, all sponge, needle, and instrument  counts were found to be correct.  Estimated blood loss was approximately 150  mL.  The patient received 1400 mL of crystalloids intraoperatively.  There  were no complications.  The patient was taken to recovery room in  satisfactory condition.   I would like to have the cardiology service still see this patient  perioperatively.  We will check some counts on her and she needs to be  transfused postoperatively, we will, as she began the surgery slightly  anemic and required 2 units preoperatively as well.      Barbaraann Barthel, M.D.  Electronically Signed     WB/MEDQ  D:  07/30/2006  T:  07/31/2006  Job:  [id number]

## 2011-02-20 NOTE — Consult Note (Signed)
NAMELAMEKIA, [name]               ACCOUNT NO.:  [id number]   MEDICAL RECORD NO.:  [id number]          PATIENT TYPE:  INP   LOCATION:  A336                          FACILITY:  [facility]   PHYSICIAN:  Gerrit Friends. Dietrich Pates, MD, FACCDATE OF BIRTH:  [birth year]   DATE OF CONSULTATION:  07/26/2006  DATE OF DISCHARGE:                                   CONSULTATION   PRIMARY CARE:  [facility] Department.   CARDIOLOGIST:  Previously, Dr. Dorethea Clan.   HISTORY OF PRESENT ILLNESS:  A 55 year old woman with known coronary disease  presents with acute cholecystitis.  [name] was first seen by our group in  August 2005 when she presented with chest discomfort.  A stress test  suggested ischemia in multiple vascular territories.  Cardiac  catheterization was undertaken revealing severe three-vessel coronary  disease and prompting uncomplicated CABG surgery.  Since recovering from the  operation, [name] has been well.  She has not had any significant chest  discomfort nor exertional dyspnea.  Unfortunately, she continues to smoke  cigarettes, albeit at a reduced rate.  Conditions that contribute to  vascular disease include hypertension and dyslipidemia.   PAST MEDICAL HISTORY:  1. Seizure disorder.  2. GERD.  3. Anxiety.  4. She has moderately severe cerebrovascular disease that was assessed at      the time of her cardiac surgery.  There was a 60-80% ICA stenosis on      the right and a 40-60% lesion on the left.   RECENT MEDICATIONS:  1. Aspirin 81 mg daily.  2. Toprol 50 mg daily.  3. Clopidogrel 75 mg daily.  4. Lovastatin at an unknown dose.  5. She also apparently uses a thiazide diuretic on an as-needed basis.      She has taken substantial amounts of Xanax in the past.   SOCIAL HISTORY:  Married and lives in [place]; employed as a Naval architect;  three children by a previous marriage.  Continued cigarette use, currently  the rate of 1/3 pack per day with a prior history of  30-pack years.  She has  utilized moderate amounts of alcohol in the form of beer in the past.  He  has also used marijuana and crack cocaine.   FAMILY HISTORY:  Mother died at age 34 due to myocardial infarction; onset  of coronary disease was at age 54.  Father also suffered a fatal myocardial  infarction-his was at age 46.  She has six siblings, none with coronary  disease.   REVIEW OF SYSTEMS:  Notable for obesity with stable weight and appetite.  She has chronic headaches.  All other systems reviewed and are negative.   PHYSICAL EXAMINATION:  GENERAL:  On exam, a pleasant woman in no acute  distress.  VITAL SIGNS:  The heart rate is 70 and regular, blood pressure 100/60,  respirations 20, temperature 98.2.  Afebrile.  HEENT:  Anicteric sclerae; pupils are equal, round, react to light.  Normal  oral mucosa.  NECK:  Slight jugular venous distension; normal carotid upstrokes without  bruits.  ENDOCRINE:  No thyromegaly.  HEMATOPOIETIC:  No adenopathy.  SKIN:  No significant lesions.  LUNGS:  Clear.  CARDIAC:  Normal first and second heart sounds; modest systolic ejection  murmur; PMI not appreciated.  ABDOMEN:  Tenderness in the right upper quadrant; normal bowel sounds; no  rebound; no masses.  EXTREMITIES:  Distal pulses intact; no edema.  NEUROMUSCULAR:  Symmetric strength and tone; normal cranial nerves.  PSYCHIATRIC:  Alert and oriented; normal affect.   EKG:  Sinus bradycardia; right ventricular conduction delay; nondiagnostic  inferior Q-waves; minor nonspecific ST-segment abnormality.   IMPRESSION:  [name] is asymptomatic from a cardiopulmonary standpoint.  Graft occlusion is unlikely at 2 years post coronary artery bypass graft  surgery.  She is adequately treated with beta blockers and aspirin.  No  further cardiovascular testing nor treatment is necessary prior to the  planned surgical procedure, which carries a reasonable risk.   [name] has been treated  continuously with clopidogrel since her cardiac  surgery.  This medication is generally considered to cause an unacceptable  risk of bleeding unless emergent surgery is required.  That does not appear  to be the case at present.  Accordingly, I would recommend initial medical  therapy for her gallbladder disease with surgery deferred for at least 1-2  weeks.  Plavix will be held.  Metoprolol and aspirin.  We will be happy to  follow this nice woman with you.      Gerrit Friends. Dietrich Pates, MD, [facility]  Electronically Signed     RMR/MEDQ  D:  07/26/2006  T:  07/27/2006  Job:  [id number]

## 2011-02-20 NOTE — Cardiovascular Report (Signed)
NAME:  [name], [name]                         ACCOUNT NO.:  [id number]   MEDICAL RECORD NO.:  [id number]                   PATIENT TYPE:  INP   LOCATION:  2399                                 FACILITY:  [facility]   PHYSICIAN:  Carole Binning, M.D. [facility]         DATE OF BIRTH:  01-Sep-1956   DATE OF PROCEDURE:  05/12/2004  DATE OF DISCHARGE:                              CARDIAC CATHETERIZATION   PROCEDURE PERFORMED:  Left heart catheterization with coronary angiography  and left ventriculography.   CARDIOLOGIST:  Carole Binning, M.D.   INDICATIONS:  [name] is a 55 year old woman with multiple cardiac risk  factors.  She presented to [facility] with symptoms of progressive angina.  A stress Cardiolite scan showed perfusion defects with ischemia in both  anterior and inferior walls and a depressed ejection fraction.  She was  referred for cardiac catheterization.   DESCRIPTION OF PROCEDURE:  A 6 French sheath was placed in the right femoral  artery.  Coronary angiography was performed with the standard Judkins 6  French catheters.  Left ventriculography was performed with an angled  pigtail catheter.  Contrast was Omnipaque.  There were no complications.   RESULTS:  HEMODYNAMICS:  Left ventricular pressure 120/14. Aortic pressure  136/76. There was no aortic valve gradient.   LEFT VENTRICULOGRAM:  Wall motion is normal.  Ejection fraction is estimated  at greater than or equal to 60%.  There is no mitral regurgitation.   CORONARY ARTERIOGRAPHY:  (Right dominant).   Left main:  The left main has a diffuse 30% stenosis.   The left anterior descending coronary artery has a tubular 75% stenosis in  the ostium.  The distal LAD is a small caliber vessel and has 50% stenosis.  The LAD gives rise to a very large diagonal branch which has tandem 90%  stenoses followed by a 40% stenosis.  There is a small caliber but lengthy  vessel that does extend to the apex.   The left circumflex  gives rise to a single very large obtuse marginal branch  which supplies the apical lateral wall.  There is 50% stenosis in the ostium  of the left circumflex, 40% mid circumflex. The marginal branch itself has  tandem 80% stenoses proximally.  There is a 50% stenosis in the distal  portion of this marginal branch.   The right coronary artery had a 95% stenosis in the proximal vessel followed  by the 99% stenosis in the mid vessel followed by a 90% stenosis in the mid  vessel.  In the mid vessel, just before the posterior descending artery,  there is a 60-70% stenosis.  In the AV groove portion of the distal right  coronary artery there is a 60% stenosis.  The distal right coronary artery  gives rise to a fairly long normal caliber posterior descending artery which  has a diffuse 70-80% stenosis throughout.  There is a normal size fairly  lengthy first posterior lateral branch which has an 80% stenosis in the mid  body.   IMPRESSION:  1. Normal left ventricular systolic function.  2. Severe three vessel coronary artery disease.   PLAN:  The patient will be referred for coronary artery bypass surgery.                                               Carole Binning, M.D. [facility]    MWP/MEDQ  D:  05/12/2004  T:  05/13/2004  Job:  [id number]   cc:   [facility]   Vida Roller, M.D.  Fax: [phone]   Cardiac Catheterization Lab

## 2011-02-20 NOTE — Discharge Summary (Signed)
[name], [name]               ACCOUNT NO.:  [id number]   MEDICAL RECORD NO.:  [id number]          PATIENT TYPE:  INP   LOCATION:  A336                          FACILITY:  [facility]   PHYSICIAN:  Barbaraann Barthel, M.D. DATE OF BIRTH:  [birth year]   DATE OF ADMISSION:  07/26/2006  DATE OF DISCHARGE:  10/29/2007LH                                 DISCHARGE SUMMARY   DIAGNOSES:  1. Acute cholecystitis secondary to cholelithiasis.  2. Coronary artery disease status post coronary artery bypass.  3. Obesity with multiple orthopedic problems.  4. Anemia.   PROCEDURE:  On July 30, 2006, laparoscopic cholecystectomy.   CONSULTATIONS:  [place] cardiology service and consultation also to the GI  service.   Note this is a 55 year old white female who was admitted through the  emergency room with signs and symptoms of acute cholecystitis secondary to  cholelithiasis.  She was found preoperatively to be anemic.  She has a  significant cardiovascular history with previous coronary artery bypass  surgery.  She was seen in consultation prior to surgery with  group.  She was cleared for surgery and this took place on July 30, 2006 at which  time a laparoscopic cholecystectomy was performed.  The patient did well  postoperatively.  She was maintained on antibiotics until the third  postoperative day.  She was doing well.  At that time, she had minimal  Jackson-Pratt drainage noted.  She was tolerating p.o. well.  She had no  shortness of breath or leg pain and her wound was clean without any signs of  infection.  She remains stable from a cardiovascular point of view.  Perioperatively, she was transfused as she began her surgery.  Her H&H  preoperatively was 8.0 and 23.3.  It was for this reason we also consulted  the GI service for plans for an outpatient endoscopy on her.  We did obtain  an anemia profile.  The results of which are not on the chart at the time of  this dictation.  She  will be discharged on a regular diet and with  multivitamin with iron.  She will have arrangements made for colonoscopy to  reveal another any occult bleeding on her part.   LABORATORY DATA:  Her liver function studies, she was admitted with a total  bilirubin 0.5 with an alkaline phosphatase of 102 and a lipase not elevated  at 18 and SGPT of 57.  Her anemia studies, iron was 31 which is low.  Vitamin B12, folate and ferritin levels reported as 1044, 1849 and 336  respectively.  TSH is 20.684.  This is high.  She will be followed medically  regarding her anemia and the need for colonoscopy later as well as her  thyroid function studies.   DISCHARGE INSTRUCTIONS:  She is discharged on a full liquid and soft diet.  She is told to avoid greasy fried foods and chocolate and postoperatively  she is told to clean her wounds with alcohol three times a day.  She is  given a prescription for Darvocet-N 100 1 tablet every four hours as needed  for pain.  She is told to do no heavy lifting or straining or driving.  She  is told to come to the emergency room should there be any acute changes.  She is to  continue her Toprol XL 50 mg daily.  She is told to hold her Plavix and  aspirin for now, hydrochlorothiazide 25 mg p.o. daily and Prozac 20 mg p.o.  daily and Benazepril 20 mg p.o. daily.  She is to make follow-up  arrangements with the [facility] group and with the GI service.  We will follow  her perioperatively as well.      Barbaraann Barthel, M.D.  Electronically Signed     WB/MEDQ  D:  08/02/2006  T:  08/02/2006  Job:  [id number]   cc:   Gerrit Friends. Dietrich Pates, MD, [facility]  [address]  [place], [place] [address]   Lionel December, M.D.  [address]  [place]  [place] [address]

## 2011-02-20 NOTE — Op Note (Signed)
[place]. [facility]  Patient:    [name], [name]                     MRN: [id number] Proc. Date: 11/26/99 Adm. Date:  [id number] Attending:  Milly Jakob CC:         Harvie Junior, M.D.                           Operative Report  PREOPERATIVE DIAGNOSIS: 1. Severe impingement with a high grade partial thickness rotator cuff tear    versus full thickness rotator cuff tear. 2. Anterolateral impingement. 3. Acromioclavicular joint arthritis. 4. Ulnar nerve compression at the elbow. 5. Carpal tunnel syndrome documented by EMG.  POSTOPERATIVE DIAGNOSIS:  OPERATION PERFORMED: 1. Right shoulder athroscopy with debridement of rotator cuff tear undersurface. 2. Anterolateral acromioplasty in the subacromial space. 3. Distal clavicle excision. 4. Ulnar nerve decompression with medial epicondylectomy. 5. Carpal tunnel release.  SURGEON:  Harvie Junior, M.D.  ANESTHESIA:  General.  INDICATIONS FOR PROCEDURE:  The patient is a 55 year old female with a long history of having had significant problems with her right upper extremity.  She had a severe amount of pain requiring narcotic pain medication.  She was also evaluated by Dr. Murray Hodgkins who ultimately did EMGs which diagnosed her with cubital tunnel as well as carpal tunnel syndrome.  She furthermore had work-up, the MRI showing that she had a significant high grade partial thickness tear versus full thickness tear anterolateral acromial impingement and AC joint arthritis.  We initially evaluated her and had a long talk about treatment options.  She ultimately had been treated conservatively by Dr. Murray Hodgkins for a long period of time and felt to be doing poorly and sent over for evaluation.  At that point she did wish to have the surgery to resolve these problems given the significant amount of pain she was having.  It was our best thought that these should be done staged; however, the patient  was concerned about repetitive anesthetic and did want to do these all at once.  We did discuss with her the possibility that she could have a significant amount of pain as well as the possibility of reflex sympathetic dystrophy but after discussion of these issues, she ultimately did decided to proceed with surgical intervention.   DESCRIPTION OF PROCEDURE:  The patient was brought to the operating room and after adequate anesthesia was obtained with a general anesthetic, the patient placed supine on the operating table.  She was then moved into the beach chair position. All bony prominences well padded.  A sterile field was then created.  She was prepped and draped in the usual sterile fashion.  Following this, routine arthroscopic examination of the shoulder joint revealed that there was significant fray of the undersurface of the rotator cuff.  This was debrided with a 4-5 shaver from the undersurface.  The remaining rotator cuff was then identified. Bleeding was seen well from the debrided edges and a probe was used to ensure that there was an attachment of the rotator cuff to bone and there was.  This was a high grade  partial thickness lesion.  All on the undersurface at this point.  The remainder of the glenohumeral joint revealed the biceps tendon was intact.  The labrum was intact.  There was no evidence of instability.  Following this, the cannula was  left in the shoulder and the  anterior portal and the ____________  cannula was then placed into the subacromial space.  Once this was placed, a sucker shaver as used and the subacromial space was opened up.  A cautery device was then used to control bleeding and remove all periosteal tissue from the undersurface of the acromion in the area of the distal clavicle.  The rotator cuff was then examined from above and there was no evidence of full thickness defect and there was no water that came through the cannula in  the shoulder joint throughout this period of time.  An anterolateral acromioplasty was then performed from the lateral portal and the distal clavicle was then identified and a distal clavicle excision was performed from both the lateral portal and then from the anterior portal.  The probe was then used from the lateral portal to make sure that the rotator cuff as intact.  In fact, you could feel the high grade nature of the partial tear but t was our feeling that with the acromioplasty performed that the patient would do  well with this debridement of this area.  At this point a measured probe was used to make sure the distal clavicle had been excised and about 15 mm of distal clavicle had been taken full thickness.  The attention was then turned towards changing the camera to the lateral portal and the bur from the posterior portal and from the cutting block technique, the acromioplasty appeared to have been done adequately.  At this point the shoulder was copiously irrigated and suctioned dry. Steri-Strips were placed across the wounds of the shoulder and a sterile compressive dressing was then applied.  The drapes were all removed at this point, however, and a sterile compressive dressing was applied.  Following this, a tourniquet a tourniquet was put in place on the upper arm and the right upper arm was then prepped and draped in the usual sterile fashion after the patient was hen moved to the supine position and care being taken to make sure that she had padding for all her bony prominences.  After the tourniquet was placed, the right arm was then prepped and draped in the usual sterile fashion.  Following this, a blood pressure tourniquet was inflated to 250 mmHg.  A reprep and drape was performed and the surgeon went to rescrub at this point.  Following this, an incision was made for a standard approach to a carpal tunnel.  The subcutaneous tissues were dissected down to  the level of the volar carpal ligament which was identified and sharply divided.  At this point once we just barely got through the ligament, a  Therapist, nutritional was passed proximally and distally to make sure that the nerve was  not adherent and the scissors were then used to resect the volar carpal ligament. Following this, a finger could be placed into the wound both proximally and distally.  The wound was copiously irrigated, suctioned dry and closed with a combination of interrupted and running sutures.  Following this, attention was turned to the elbow where a curved incision was made approximately 12 cm long centering over the ulnar nerve between the olecranon and the medial epicondyle.  The subcutaneous tissues were dissected down to the level of the ulnar nerve which was identified. It was divided proximally up to the intermuscular septum, care being taken to preserve all branches of the nerve.  Following this, the nerve was identified in the groove and then traced distally between the two heads  of the flexor carpi ulnaris and this was done for approximately 7 cm.  The nerve was completely freed up at this point and seemed to be most irritable just distal to the elbow.  At this point given the moderate nature of her findings by EMG, it as felt that a medial epicondylectomy would be appropriate.  The nerve was carefully protected at this point and the muscles overlying the medial epicondyle were identified and the bone was removed from the medial epicondyle and the periosteal layer was then repaired over this.  The nerve was then allowed to seek its own level without any evidence of impingement and the wound was then copiously irrigated and suctioned dry.  The skin was then closed with a combination of 2-0 Vicryl and 4-0 nylon.  A sterile compressive dressing was applied at this point and a volar plaster and sterile compressive dressing was applied to the hand and then  a bulky dressing was applied over the elbow.  The patient was transferred to the recovery room where she was noted to be in satisfactory condition.  Estimated blood loss for this procedure was none.  Estimated tourniquet time was an hour and 15  minutes. DD:  11/26/99 TD:  11/26/99 Job: [id number]
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
## 2011-02-20 NOTE — Group Therapy Note (Signed)
   NAME:  [name], [name]                        ACCOUNT NO.:  [id number]   MEDICAL RECORD NO.:  [id number]                   PATIENT TYPE:  EMS   LOCATION:  ED                                   FACILITY:  [facility]   PHYSICIAN:  Fredirick Maudlin, M.D.              DATE OF BIRTH:  11-May-1956   DATE OF PROCEDURE:  DATE OF DISCHARGE:                                   PROGRESS NOTE   Normal sinus rhythm with rate in the 70's.  Normal EKG.                                               Fredirick Maudlin, M.D.    ELH/MEDQ  D:  05/27/2002  T:  05/27/2002  Job:  [id number]
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
## 2011-02-20 NOTE — Consult Note (Signed)
[name], [name]               ACCOUNT NO.:  [id number]   MEDICAL RECORD NO.:  [id number]          PATIENT TYPE:  INP   LOCATION:  A336                          FACILITY:  [facility]   PHYSICIAN:  Kassie Mends, M.D.      DATE OF BIRTH:  Nov 20, 1955   DATE OF CONSULTATION:  08/01/2006  DATE OF DISCHARGE:  08/02/2006                                   CONSULTATION   REASON FOR CONSULTATION:  Anemia.   HISTORY OF PRESENT ILLNESS:  [name] is a 55 year old female who was  admitted with a hemoglobin of 11.7 and an MCV of 93.5. She has had abdominal  pain off and on since her 16s. She has occasional constipation which she  treats with laxatives. She also uses milk in coffee with cream and sugar.  Last Friday, she had diarrhea for 4 days. She did see black stool on iron  pills. She denies any bright red blood per rectum or hematemesis. She denies  any difficulty swallowing. She treats her heartburn and indigestion with  Pepcid or Tagamet or Tums. She can't afford Prilosec. She has no family  history of colon cancer or colon polyps. She was admitted on October 19th  with abdominal pain. She had a laparoscopic cholecystectomy performed due to  acute cholecystitis on October 26th. She was seen and evaluated by  Cardiology who felt that she should be maintained chronically on metoprolol  and aspirin.   PAST MEDICAL HISTORY:  1. Seizure disorder.  2. GERD.  3. Anxiety.  4. Coronary artery disease.   PAST SURGICAL HISTORY:  Coronary artery bypass graft.   ALLERGIES:  None.   MEDICATIONS:  Rocephin, Pepcid, Dilaudid PCA, metoprolol, Tylenol p.r.n.   FAMILY HISTORY:  She has no family history of colon cancer or colon polyps.   SOCIAL HISTORY:  She is married and lives in [place]. 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 She has 3 children. She has not participated in recreational  drug abuse for 3 years. She denies any alcohol use. She stopped smoking when  she was admitted to the hospital.   REVIEW OF SYSTEMS:  Review of systems per the HPI, otherwise all systems are  negative.   PHYSICAL EXAMINATION:  Afebrile, hemodynamically stable.  HEENT:  Atraumatic, normocephalic. Pupils equal and react to light. Mouth -  no oral lesions. Posterior pharynx without erythema or exudate.  NECK:  Has full range of motion, no lymphadenopathy.  LUNGS:  Clear to auscultation bilaterally.  CARDIOVASCULAR EXAM:  Is a regular rhythm, no murmur, normal S1 and S2.  ABDOMEN:  Bowel sounds present, soft, obese, mildly distended. Mild  tenderness to palpation in all 4 quadrants, no rebound or guarding.  EXTREMITIES:  Are without cyanosis, clubbing, or edema. She has no focal  neurologic deficits.   LABS:  Hemoglobin 10.6, white count 12, platelets 301,000, potassium 4.2,  BUN 8, creatinine 0.9, TSH 20.6, ferritin 336, total bili 0.5, alk phos 102,  AST 46, ALT 57, albumin 3.7, creatinine 1. Height 5 feet 5 inches, weight  110 kilos.   RADIOGRAPHIC STUDIES:  Ultrasound  10/22:  Gallstones, common bile duct 5 mm,  fatty liver.   ASSESSMENT:  [name] is a 55 year old female with a normocytic anemia on  aspirin and Plavix chronically as an outpatient. She has no evidence of  active bleeding. She has a normal ferritin. She is at average risk for  developing colon cancer. The differential diagnosis for her anemia includes  early iron-deficiency anemia secondary to peptic ulcer disease, colon polyp,  and a low likelihood of colon cancer. Her mildly elevated liver enzymes are  likely secondary to steatohepatitis. She is status post- laparoscopic  cholecystectomy. Thank you for allowing me to see [name] in consultation.  My recommendations follow.   RECOMMENDATIONS:  1. Would perform EGD as an inpatient if she manifests evidence of active      bleeding. Otherwise, she may return to see me in January 2008 and I      will schedule colonoscopy with followup EGD if no source for anemia is       identified.  2. Will also discuss weight loss and diet modification for her      steatohepatitis.  3. Recommend a low-fat diet.  4. Recommend that she use over-the-counter Prilosec for her symptoms of      gastroesophageal reflux disease.      Kassie Mends, M.D.  Electronically Signed     SM/MEDQ  D:  08/02/2006  T:  08/02/2006  Job:  [id number]

## 2011-02-20 NOTE — Discharge Summary (Signed)
NAME:  [name], [name]                         ACCOUNT NO.:  [id number]   MEDICAL RECORD NO.:  [id number]                   PATIENT TYPE:  INP   LOCATION:  2022                                 FACILITY:  [facility]   PHYSICIAN:  Kerin Perna, M.D.               DATE OF BIRTH:  Apr 13, 1956   DATE OF ADMISSION:  05/10/2004  DATE OF DISCHARGE:  05/18/2004                                 DISCHARGE SUMMARY   ADMISSION DIAGNOSIS:  Unstable angina.   ADDITIONAL DIAGNOSES/DISCHARGE DIAGNOSES:  1. Unstable angina secondary to severe three-vessel coronary artery disease.  2. Hypertension.  3. Chronic obstructive pulmonary disease.  4. Chronic anxiety.  5. Gastroesophageal reflux disease.  6. History of chronic headaches.  7. Status post left anterior cruciate ligament knee repair.  8. Status post right shoulder and elbow repair, following injury.  9. Carotid artery disease (60-80% right internal carotid stenosis, 40-60%     left internal carotid stenosis).  10.      Long term history of marijuana and other substance abuse.   HOSPITAL MANAGEMENT/PROCEDURES:  1. Cardiac catheterization, completed on May 12, 2004, by Dr. Loraine Leriche     Pulsipher.  This revealed a severe three-vessel coronary artery disease.  2. Preliminary arterial evaluation for planned cardiac surgery, completed on     May 12, 2004.  This includes bilateral carotid duplex exam, bilateral     palmar arch tests, and bilateral ABIs.  3. Coronary artery bypass grafting x 4 utilizing the left internal mammary     artery to the anterolateral, saphenous vein graft to the left anterior     descending, saphenous vein graft to the circumflex, saphenous vein graft     to the posterior descending with endarterectomy.  This was completed on     May 13, 2004 by Dr. Donata Clay.  4. Initiation of cardiac rehab phase I.   CONSULTATIONS:  1. Smoking cessation counselor.  2. Cardiac rehab.   HISTORY OF PRESENT ILLNESS:  [name] is a  55 year old white female smoker  who presented to the [facility], on May 09, 2004,  for her prescription blood pressure medications.  At that time, the patient  complained of chest discomfort of a squeezing tightness for over two hours.  The patient had exertional diaphoresis, nausea, and shortness of breath as  well.  An electrocardiogram was performed which apparently was abnormal, and  she was admitted to [facility] with a diagnosis of unstable angina.  The  patient's initial cardiac enzymes were negative.  The patient was then  transferred to [facility] for further management and evaluation.   HOSPITAL COURSE:  1. [name] was admitted to [facility], on May 09, 2004, with     unstable angina and an abnormal EKG.  The patient underwent a Cardiolite     stress test which revealed a moderate sized inferior defect that was  reversible and an anterior defect that was partially reversible.  Her     left ventricular ejection fraction was estimated at 48% (please note this     was completed at [facility]) in [place], [place].  With the     findings of an abnormal Cardiolite stress test, the patient was scheduled     for cardiac catheterization for May 12, 2004.  In the meantime, the     patient remained hemodynamically stable and free of chest pain.  2. The patient was taken to the cardiac catheterization lab and underwent     heart catheterization on May 12, 2004.  This showed severe three-vessel     coronary artery disease, specifically at 75% stenosis of the proximal     left anterior descending, 90% stenosis of the diagonal, 80-90% stenosis     of the large obtuse marginal circumflex, and 90% stenosis of the mid     right coronary artery.  The patient's overall ejection fraction was     normal and her left ventricle and diastolic pressure was 14-mmHg.  The     patient has no mitral regurgitation and no evidence of aortic  stenosis.     With these findings a surgical consultation was initiated.  3. Dr. Donata Clay of [facility] saw and examined the patient in consultation then,     on May 12, 2004, for evaluation for potential surgical coronary     revascularization.  Dr. Donata Clay felt that the patient would benefit     from surgical revascularization and this was planned for the morning of     May 13, 2004.  The risks, benefits, and alternatives to the procedure     were discussed with the patient and her family at that time.  They were     in understanding and agreed to proceed with surgery.  The patient     underwent preoperative arterial evaluation which included bilateral     carotid duplex exams.  This study showed a right 60-80% internal carotid     artery stenosis and a left 40-60% internal carotid artery stenosis.  The     patient also underwent bilateral palmar arch tests which showed that the     right palmar arch signal diminished greater than 50% with radial     compression and remained normal with ulnar compression.  The left palmar     arch signal remained normal with radial and ulnar compression.  The     patient's bilateral ABIs were completed and were greater than 1.0.  4. The patient was taken to the operating room then on May 13, 2004.  The     patient underwent coronary artery bypass grafting x 4.  Utilizing the     left internal mammary artery to the anterolateral coronary artery,     saphenous vein graft to the left anterior descending artery, saphenous     vein graft to the circumflex, saphenous vein graft to the posterior     descending artery with endarterectomy.  The patient's greater saphenous     vein was endoscopically harvested from the right leg and thigh.  Overall,     the patient tolerated this procedure well and was transferred to the     surgical intensive care unit in critical but stable condition. 5. Postoperatively, the patient made routine progress towards recovery.   She     was extubated later in the evening of her operative day without any  difficulty.  The patient was initiated on all postoperative protocols and     continue to do well.  Her postoperative course was somewhat complicated     by difficulty with pain control.  As stated in the patient's past medical     history, she has a long term history of regular illegal substance abuse.     The patient had a difficult time with the usual doses of pain medications     used postoperatively.  In response to this, the patient was given a     Fentanyl transdermal patch for sustained release of pain medication as     well as an increased dose and scheduled oral narcotics.  Ultimately, the     patient's pain was relatively controlled on this regimen.  Otherwise, the     patient had remained hemodynamically stable and afebrile and was deemed     appropriate for initiation of discharge planning with anticipation of     discharge on May 18, 2004.   At the time of initiation of discharge planning, the patient had resumed  normal bowel and bladder function.  She was tolerating a regular diet.  She  was ambulating with assistance in the hallways without difficulty.  Her  vital signs were as follows:  Blood pressure 150/70, pulse 90 and regular,  respirations 20 and unlabored, SpO2 was 92-97% on room air.  The patient's  weight was 240 pounds with a preoperative weight estimated at 220 pounds.   PHYSICAL EXAMINATION:  HEART:  Was in a regular rate and rhythm reading  normal sinus rhythm on telemetry.  LUNGS:  Revealed faint bibasilar crackles, otherwise were clear.  ABDOMEN:  Soft, nontender, nondistended with good bowel sounds.  EXTREMITIES:  Had 1+ ankle edema bilaterally.  CHEST:  Her sternum was stable and her incisions were clean, dry and intact  without evidence of infection.   LAB VALUES:  At the time of initiation of discharge planning are as follows:  CBC, from May 16, 2004, reads WBC of  8.7, hemoglobin of 8.3, hematocrit  of 24.2, and platelet count of 168.  B-MET, from May 16, 2004, reads  sodium of 135, potassium of 4.6, chloride of 101, CO2 of 28, glucose of 117,  BUN of 8, creatinine of 0.9.   Chest x-ray, completed on May 16, 2004, reads improvement in aeration as  well as improvement in edema.  There remained some left lower lobe  atelectasis and effusion.  There was no evidence of pneumothorax.  There  remained moderate cardiomegaly.   DISPOSITION:  We will continue as planned with discharge, May 18, 2004,  pending further rounds and no change in the patient's clinical status.   DISCHARGE MEDICATIONS:  1. Aspirin 81 mg daily.  2. Metoprolol 12.5 mg twice daily.  3. Zocor 20 mg daily.  4. Plavix 75 mg daily.  5. Fentanyl patch 100 mcg transdermal every three days.  6. Lasix 40 mg daily for seven days.  7. Potassium 20 mEq daily for seven days. 8. Percocet 10/650 mg one to two tablets every four to six hours as needed     for pain.   DISCHARGE INSTRUCTIONS:  1. Activity:  The patient should avoid driving.  She should avoid heavy     lifting or strenuous activity.  She should continue to walk daily.  She     should continue her breathing exercises.  2. Diet:  The patient should follow a low-fat, low-salt, heart-healthy diet.  3.  Wound care:  The patient may shower.  She should clean her incisions     daily with soap and water.  She should notify the [facility] office if she has     any redness, swelling, or drainage from her incisions.   FOLLOWUP APPOINTMENTS:  1. The patient should follow up with Dr. Dorethea Clan within two weeks of     discharge.  The  office will give the patient this exact date and     time.  2. The patient is scheduled to see Dr. Donata Clay on June 20, 2004 at     12:30 p.m.      Carolyn A. Eustaquio Boyden.                  Kerin Perna, M.D.    CAF/MEDQ  D:  05/16/2004  T:  05/17/2004  Job:  [id number]   cc:   chart    Mikey Bussing, M.D.  [address]  [place]  [place] [address]   Carole Binning, M.D. [facility]   Vida Roller, M.D.  Fax: [phone]

## 2011-02-20 NOTE — Procedures (Signed)
NAME:  [name], [name]                        ACCOUNT NO.:  [id number]   MEDICAL RECORD NO.:  [id number]                   PATIENT TYPE:  EMS   LOCATION:  ED                                   FACILITY:  [facility]   PHYSICIAN:  Fredirick Maudlin, M.D.              DATE OF BIRTH:  [birth year]   DATE OF PROCEDURE:  DATE OF DISCHARGE:  04/29/2002                                EKG INTERPRETATION   ELECTROCARDIOGRAM:  RHYTHM:  Sinus rhythm at rate of 90.   ASSESSMENT:  Normal EKG.                                               Fredirick Maudlin, M.D.    ELH/MEDQ  D:  04/30/2002  T:  05/02/2002  Job:  [id number]

## 2011-02-20 NOTE — Op Note (Signed)
NAME:  [name], [name]                         ACCOUNT NO.:  [id number]   MEDICAL RECORD NO.:  [id number]                   PATIENT TYPE:  INP   LOCATION:  2316                                 FACILITY:  [facility]   PHYSICIAN:  Kerin Perna III, M.D.           DATE OF BIRTH:  1956/04/18   DATE OF PROCEDURE:  05/13/2004  DATE OF DISCHARGE:                                 OPERATIVE REPORT   OPERATION:  1. Coronary endarterectomy.  2. Coronary artery bypass grafting x 4 (left internal mammary artery to the     anterolateral, saphenous vein graft to left anterior descending,     saphenous vein graft to circumflex, saphenous vein graft to posterior     descending with endarterectomy).   PREOPERATIVE DIAGNOSIS:  Class IV unstable angina with severe three vessel  coronary artery disease.   POSTOPERATIVE DIAGNOSIS:  Class IV unstable angina with severe three vessel  coronary artery disease.   SURGEON:  Kerin Perna, M.D.   ASSISTANT:  Eber Jones A. Thelma Barge, P.A.-C.   ANESTHESIA:  General by Dr. Heather Roberts   INDICATIONS FOR PROCEDURE:  The patient is a 55 year old female obese smoker  with a strong family history of coronary artery disease who presents with  unstable angina and negative enzymes.  Cardiac catheterization by Dr.  Gerri Spore demonstrated severe three vessel coronary disease with preserved  LV function.  She was felt to be a candidate for surgical revascularization.  Prior to surgery, I discussed the procedure with the patient and her  daughter.  I discussed the indications and expected benefits of coronary  bypass surgery for treatment of her coronary artery disease.  I reviewed the  alternatives to surgical therapy for treatment of her coronary artery  disease.  I discussed with the patient the major aspects of the planned  procedure including the location of the surgical incisions, the choice of  conduit for grafts, the use of general anesthesia and cardiopulmonary  bypass, and the expected postoperative hospital recovery.  I discussed with  the patient the risks to her of coronary bypass surgery including the risks  of MI, CVA, bleeding, infection, and death.  She understood the implications  and agreed to proceed with the operation as planned under what I felt was an  informed consent.   OPERATIVE FINDINGS:  The patient's coronary disease is severe and diffuse.  An endarterectomy was required to place a graft anastomosis of the posterior  descending.  Her LAD is small and for that reason, the mammary was placed to  the more dominant anterolateral or diagonal vessel on the anterior wall.  The coronaries were suboptimal targets and she would not be a candidate for  redo bypass surgery due to the poor quality of her coronary vessels as  targets for grafts.  No blood products were used during the procedure.   PROCEDURE:  The patient was brought to the operating room and placed  supine  on the operating table where general anesthesia was induced under invasive  hemodynamic monitoring.  The chest, abdomen, and legs were prepped with  Betadine and draped as a sterile field.  A sternal incision was made as the  saphenous vein was harvested from the right leg using endoscopic technique.  The left internal mammary artery was harvested as a pedicle graft from its  origin at the subclavian vessels and was a good vessel with excellent flow.  Heparin was administered and the ACT was documented as being therapeutic.  The sternal retractor was placed using the deep blades because of her body  habitus.  The pericardium was opened and suspended.  Purse-strings were  placed in the ascending aorta and right atrium and the patient was  cannulated and placed on bypass.  The coronaries were identified for  grafting and cardioplegia catheters were placed in the aorta and into the  right atrium and coronary sinus for both antegrade and retrograde delivery  of cold blood  cardioplegia.  The patient was cooled to 32 degrees.  The  aortic Coss clamp was applied.  800 mL of cold blood cardioplegia was  delivered in split doses between the antegrade aortic and retrograde  coronary sinus catheters.  There was good cardioplegic arrest with septal  temperature dropping to less than 12 degrees.  Topical iced saline slush was  used to augment myocardial preservation and a pericardial insufflator pad  was used to protect the left phrenic nerve.   The distal coronary anastomoses were then performed.  The first distal  anastomosis was to the posterior descending.  The right coronary was heavily  calcified and not graftable.  The distal posterior descending was also  heavily calcified.  The area of minimal but still significant plaque was in  the proximal posterior descending.  An arteriotomy was made, however, an  adequate lumen could not be established due to the thickened wall and heavy  deposits of cholesterol plaque.  An endarterectomy was then performed which  feathered out a specimen of plaque of several mm distally as well as 3-4 mm  proximally.  This was submitted for pathologic examination.  The coronary  was irrigated with cold saline and then an end-to-side anastomosis with a  reversed saphenous vein was sewn using 8-0 Prolene with good flow to the  graft.  The second distal anastomosis was to the proximal LAD which was a  small nondominant vessel on the anterior wall.  A reversed saphenous vein  graft was used to this 1.5 mm vessel with a proximal 90% stenosis.  A third  distal anastomosis was to the distal aspect of the heavily diseased  circumflex.  This was a 1.7 mm vessel with proximal 95% stenosis.  A  reversed saphenous vein graft was sewn end-to-side with running 7-0 Prolene  with good flow through the graft.  The cardioplegia was redosed.  The fourth distal anastomosis was to the anterolateral or diagonal.  This was a 1.5 mm  vessel with a proximal  90% stenosis.  The left IMA pedicle was brought  through an opening and the left lateral pericardium was brought down to the  LAD and sewn end-to-side with running 8-0 Prolene.  There was good flow  through the anastomosis with immediate rise in septal temperature after  release of the pedicle clamp on the mammary artery.  The mammary pedicle was  secured to the epicardium and the Cross clamp was removed.   The heart was reperfused  and cardioverted back to a regular rhythm.  A  partial occlusion clamp was placed on the ascending aorta and three proximal  vein anastomoses were performed using a running 6-0 Prolene and a 4 mm  punch.  The partial clamp was removed and the vein grafts were perfused.  Each had good flow and hemostasis was documented to the proximal and distal  anastomoses.  The patient was rewarmed to 37 degrees.  Temporary pacing  wires were expanded.  The lungs were expanded and the ventilator resumed.  The patient was weaned from bypass without inotropes.  EKG and hemodynamics  were stable.  Protamine was administered without adverse reaction.  The  cannulae were removed.  The mediastinum was irrigated with warm antibiotic  irrigation.  The leg incision was irrigated and closed in a standard  fashion.  The pericardium was closed superiorly over the aorta and vein  grafts.  Two mediastinal and left pleural chest tubes were placed and  brought out through separate incisions.  The sternum was closed with  interrupted steel wire.  The pectoralis fascia was closed with a running #1  Vicryl.  The subcutaneous tissue and skin layers were closed with running  Vicryl and sterile dressings were applied.  Total bypass time was 140  minutes.  The Cross clamp time was 65 minutes.                                               Mikey Bussing, M.D.    PV/MEDQ  D:  05/13/2004  T:  05/13/2004  Job:  [id number]

## 2011-02-20 NOTE — Procedures (Signed)
NAME:  [name], [name]                         ACCOUNT NO.:  [id number]   MEDICAL RECORD NO.:  [id number]                   PATIENT TYPE:  INP   LOCATION:  A208                                 FACILITY:  [facility]   PHYSICIAN:  Vida Roller, M.D.                DATE OF BIRTH:  [birth year]   DATE OF PROCEDURE:  05/09/2004  DATE OF DISCHARGE:                                    STRESS TEST   HISTORY:  Ms. Anzaldo is a 55 year old female with no known coronary artery  disease with chest discomfort which is somewhat typical for ischemia.  Her  cardiac enzymes were negative x2 for acute myocardial infarction.  Cardiac  risk factors include hypertension, tobacco abuse, strong family history, and  unknown lipid status.   RESULTS:  Baseline EKG showed sinus rhythm at 62 beats per minute with  nonspecific ST abnormalities.  Blood pressure is 158/88.   The patient exercised for a total of 7 minutes 23 seconds to Bruce protocol  stage 3, and 10.1 metz.  Maximal heart rate was 149 beats per minute which  is 87% of predicted maximum.  Blood pressure was 208/92, which resolved down  to 160/90 in recovery.  The patient complained of chest discomfort described  as a pressure with shortness of breath, nausea, and some diaphoresis at the  end of exercise.  EKG revealed some ST depression in the inferior lateral  leads.  Considering this, one sublingual nitroglycerin was given, pain  resolved within the next 3 to 4 minutes.  EKG changes improved after  sublingual nitroglycerin was given, and completely resolved further into  recovery.   Final images and results are pending M.D. review.     ________________________________________  ___________________________________________  Jae Dire, P.A. [facility]                      Vida Roller, M.D.   AB/MEDQ  D:  05/09/2004  T:  05/09/2004  Job:  [id number]

## 2011-02-20 NOTE — Procedures (Signed)
   NAME:  [name], [name]                        ACCOUNT NO.:  [id number]   MEDICAL RECORD NO.:  [id number]                   PATIENT TYPE:  EMS   LOCATION:  ED                                   FACILITY:  [facility]   PHYSICIAN:  Fredirick Maudlin, M.D.              DATE OF BIRTH:  August 08, 1956   DATE OF PROCEDURE:  05/27/2002  DATE OF DISCHARGE:                                EKG INTERPRETATION   DATE AND TIME OF TEST:  May 27, 2002 at 6:30.   IMPRESSION:  The rhythm is sinus rhythm with a rate in the 70s.  Normal EKG.                                               Fredirick Maudlin, M.D.    ELH/MEDQ  D:  05/27/2002  T:  05/27/2002  Job:  [id number]
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
## 2011-02-20 NOTE — Consult Note (Signed)
NAMEMYALEE, STENGEL NO.:  [id number]   MEDICAL RECORD NO.:  [id number]          PATIENT TYPE:  INP   LOCATION:  A336                          FACILITY:  [facility]   PHYSICIAN:  Barbaraann Barthel, M.D. DATE OF BIRTH:  1956/05/19   DATE OF CONSULTATION:  07/26/2006  DATE OF DISCHARGE:                                   CONSULTATION   NOTE:  Surgery was asked to see this 55 year old white female for abdominal  pain.   CHIEF COMPLAINT:  Right upper quadrant pain radiating to back, with nausea  and vomiting.   HISTORY OF PRESENT MEDICAL ILLNESS:  The patient states that she had a  similar severe episode like this approximately 3 years previously when she  was worked up and found to have gallstones.  She was not referred to a  surgeon.  She returns now with similar symptoms, with right lower quadrant  pain, nausea and vomiting, which began after eating a pimiento cheese  sandwich on Friday, accompanied with severe episodes of right upper quadrant  pain.  She was seen in the emergency room and then later returns to the  emergency room today.  Sonogram reveals multiple stones within the  gallbladder and slightly thickened gallbladder wall.  Surgery was consulted.   PHYSICAL EXAMINATION:  GENERAL:  Discloses a pleasant obese 55 year old  white female who is obviously uncomfortable, complaining of right upper  quadrant pain.  VITAL SIGNS:  Her temperature is 98.2,  blood pressure 98/62, pulse rate 77,  respirations 24.  She is 5 feet 5 inches and weighs 235 pounds.  HEENT:  Head is normocephalic.  Eyes:  Extraocular movements are intact.  Pupils are round and react to light and accommodation.  There is noted no  conjunctive pallor or scleral injection.  The sclera is a normal tincture.  Nose and oral mucosa are moist.  NECK:  Supple and cylindrical, without jugular vein distension, thyromegaly,  or tracheal deviation.  There is no cervical adenopathy, and there are no  bruits auscultated.  CHEST:  Clear both to anterior and posterior auscultation.  HEART:  Regular rhythm.  BREASTS AND AXILLA:  Without masses.  ABDOMEN:  Bowel sounds are present.  The patient is very tender in the right  upper quadrant, with a positive Murphy's sign.  There are no femoral or  inguinal hernias appreciated.  RECTAL:  Guaiac-negative stools.  EXTREMITIES:  The patient has had three surgeries on her right shoulder, a  right humerus surgery, and a right carpal tunnel. She has also had previous  knee surgery on her left knee.  The patient complains of generalized  arthritic type of complaints.   REVIEW OF SYSTEMS:  OB/GYN:  The patient is status post vaginal  hysterectomy.  She has also had a bladder tack.  The has a sister who has  had carcinoma of the breast.  She states that she has had a mammogram last  year that was read as normal.  GI:  Nausea, vomiting, right upper quadrant  postprandial pain. No past history of hepatitis.  No history of bright red  rectal bleeding, black tarry stools, or weight loss.  She has had no  colonoscopies.  ENDOCRINE:  No history of diabetes or thyroid disease.  CARDIOVASCULAR:  The patient has a history of hyperlipidemia, and a history  of coronary artery bypass x5.  This was performed in 2005.   She takes Toprol XL, Plavix, aspirin and lovastatin.   She has no known allergies.  She states that MORPHINE does not work very  well with her and makes her somewhat nauseated.   PREVIOUS SURGERIES:  1. Coronary artery bypass times five in 2005.  2. Carpal tunnel on the right hand.  3. Right shoulder surgery x3, the last on in 2000.  4. Right humeral surgery.  5. Left knee arthroscopy in 2001.  6. Vaginal hysterectomy in 1993, with a bladder tack at that time.  7. She had an appendectomy when she was 55 years old.   LABORATORY DATA:  Sonogram reveals, as mentioned above, multiple stones  within the gallbladder. Common bile duct is normal.   There is minimal  gallbladder wall thickening.  However, the patient does have a positive  Murphy's sign.   White count is 13.7, with an H&H of 11 and 31.9, platelet count is 291,000,  82% neutrophils are noted.  Electrolytes are grossly within normal limits.  Glucose is 122, sodium is 133, potassium 3.9, chloride 97, carbon dioxide as  27, BUN is 15 with a creatinine of 1, calcium is 8.9.  Liver function  studies show mild elevation,  SGOT 46, SGPT 57, alkaline phosphatase 102,  normal.  Bilirubin normal at 0.5, lipase is not elevated at 18.   IMPRESSION:  1. Acute cholecystitis secondary to cholelithiasis history.  2. Coronary artery disease, status post coronary artery bypass.  3. Obesity, with multiple orthopedic procedures.   PLAN:  This patient will be admitted.  We have begun antibiotics on her.  I  will limit her to clear liquid sips at the present time. Obtain a 12-lead  EKG, and plan for Dr. Dietrich Pates to see her preoperatively and adjust her  medicines, of which she does not know her doses, as his partner, Dr. Dorethea Clan,  had seen her in the past.  We will hope to do so surgery during this  admission.      Barbaraann Barthel, M.D.  Electronically Signed     WB/MEDQ  D:  07/26/2006  T:  07/27/2006  Job:  [id number]   cc:   Gerrit Friends. Dietrich Pates, MD, [facility]  [address]  [place], [place] [address]   Rhae Lerner. Margretta Ditty, M.D.  [address]  [place]  [place] [address]

## 2011-02-23 NOTE — H&P (Addendum)
NAME:  [name], [name]               ACCOUNT NO.:  [id number]  MEDICAL RECORD NO.:  [id number]           PATIENT TYPE:  I  LOCATION:  3706                         FACILITY:  [facility]  PHYSICIAN:  Kendal Ghazarian C. Reise Gladney, MD, FACCDATE OF BIRTH:  05-Feb-1956  DATE OF ADMISSION:  02/10/2011 DATE OF DISCHARGE:                             HISTORY & PHYSICAL   PRIMARY CARDIOLOGIST:  Maisie Fus C. Kyston Gonce, MD, [facility]  PRIMARY MEDICAL DOCTOR:  [facility], she sees whoever there.  PULMONOLOGIST:  Oneal Deputy. [name], MD  CHIEF COMPLAINT:  Chest pain/shortness of breath.  HISTORY OF PRESENT ILLNESS:  [name] is a 55 year old female with a history of CAD status post CABG in 2005 with history since outlined below, ischemic cardiomyopathy with an EF of 40% by cath on November 01, 2010, obesity, and chronic pain who presented to [facility] with complaints of chest pain and shortness of breath.  At 4 a.m., she was already awake having had a restless sleep when she felt sharp cramp- like substernal shooting pain in her chest, radiating to both right and left, with increasing shortness of breath.  She called 911.  Her symptoms were alleviated with oxygen, 2 sprays of nitroglycerin, and aspirin.  Morphine also helped relax her breathing.  Her pain was essentially gone with the exception of occasional short burst of current.  She does endorse chronic dyspnea on exertion as well as orthopnea, which is unchanged for her.  She has been compliant with the medications, but there may be some confusion with these as she has been apparently taking 1-1/2  tablet of Effient.  She received 40 mg of IV Lasix in the ER and urinated quite a bit, and her breathing is less laborious.  Chest x-ray demonstrated pulmonary vascular congestion with possible developing interstitial edema consistent with worsening CHF. Pro-BNP is slightly elevated at 2796.  EKG is nonacute with sinus rhythm with 64 beats per  minute with P-wave in aVL which is not new and nonspecific ST-T changes.  PAST MEDICAL HISTORY: 1. CAD.     a.     Status post CABG in June 2005 with LIMA to the large      diagonal, SVG to the LAD, SVG to the circumflex, and SVG to the      PDA.     b.     Bare metal stent to PCI in May 2011 to the vein graft to the      PDA.     c.     NSTEMI November 2011 status post PTCA/drug-eluting stent      placement to the mid SVG to PDA in the previously stented area.     d.     Catheterization November 01, 2010, showing known 2/4 grafts      occluded with stable coronary anatomy, from medical rec. 2. Ischemic cardiomyopathy with EF of 40% by cath November 01, 2010. 3. Hyperlipidemia. 4. GERD. 5. Mild-to-moderate obstructive sleep apnea diagnosed in April 2012 by     sleep study. 6. Obesity with BMI of 41. 7. COPD with history of tobacco abuse, but relatively normal  PFTs with     moderately reduced DLCO April 2012. 8. Anxiety. 9. Peripheral vascular disease with bilateral carotid stenosis 50% to     69% in 2011. 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 11.Diverticulitis. 12.Nonsustained VT. 13.Hypothyroidism. 14.Chronic pain.  The patient states she gets her pain medication from Dr. [name], until she can establish with a pain management doctor.  Office note, however, last month also stated that [facility], and she was very adamant about getting medications for pain at that visit with [facility].  MEDICATIONS: 1. Lasix, unknown dose. 2. Lovastatin 80 mg p.o. 3. Xanax 1 mg q.i.d. p.r.n. 4. Prozac 20 mg daily. 5. Toprol 50 mg. 6. Effient 10 mg one-half tablet daily.  The patient states she was     told to increase this 1-1/2 tablet daily, however, I see no record     from that office note. 7. Aspirin 325 mg daily. 8. Tegretol 200 mg b.i.d. 9. Hydrocodone/acetaminophen 10/325 mg t.i.d. 10.Artificial Tears. 11.Ambien 10 mg half tablet at bedtime  p.r.n. 12.Synthroid 50 mcg daily. 13.Multivitamin. 14.Nexium 40 mg. 15.Benazepril 40 mg.  The patient got Nitropaste, morphine 8 mg, Zofran, Lasix 40 mg IV x1, and Toradol 30 mg IV push in the ER.  She is also requesting more pain medication for her headaches.  SOCIAL HISTORY:  [name] lives in [place].  She recently left her husband.  She just got disability and Medicaid.  She quit smoking in May 2011 and denies any alcohol use.  She endorses occasional marijuana use. She was counseled on cessation of this.  FAMILY HISTORY:  Mother passed away at 84 of breast cancer.  Father died at age 26 of an MI.  ALLERGIES:  No known drug allergies.  REVIEW OF SYSTEMS:  No fevers, chills.  Positive for nausea and vomiting earlier today.  All other systems reviewed and otherwise negative.  LABORATORY DATA:  WBC 12.1, hemoglobin 11.1, hematocrit 33.4, platelet count 218.  Sodium 138, potassium 2.8, chloride 102, CO2 of 25, glucose 124, BUN 15, creatinine 0.24.  Pro-BNP 2796.  Cardiac enzymes negative x1.  Chest x-ray showed cardiac enlargement and pulmonary vascular congestion with possible developing interstitial edema consistent with worsening CHF.  PHYSICAL EXAMINATION:  VITAL SIGNS:  Temperature 98, pulse 58, respirations 20, blood pressure 137/68, pulse ox 100% on non-rebreather. She came in at 100% on room air. GENERAL:  This is an obese white female in no acute distress. HEENT:  Head is normocephalic, atraumatic.  Extraocular movements intact.  Intact sclerae.  Nares are without discharge. NECK:  Supple. HEART:  Auscultation of the heart reveals regular rate and rhythm with S1 and S2 without murmurs, rubs, or gallops. LUNGS:  Lung sounds are actually mostly clear with just decreased breath sounds overall. ABDOMEN:  Soft, nontender, nondistended with positive bowel sounds. EXTREMITIES:  Warm and dry without edema. NEUROLOGIC:  She is alert and oriented x3, responds to  questions appropriately with a normal affect.  ASSESSMENT/PLAN:  The patient was seen and examined by Dr. Daleen Squibb and myself in the ER.  This is a 55 year old female with a known history of coronary artery disease with most recent cath end of January 2012 with stable anatomy, ischemic cardiomyopathy, chronic pain who was admitted with chest pain and shortness of breath.  Chest x-ray initially showed some interstitial edema and slightly elevated pro-BNP at 2796.  Cardiac enzymes were negative x1.  EKG is without acute changes.  Despite 8 mg of morphine and  30 mg of Toradol, the patient is also requesting pain medications for headaches.  Her chest pain sounds atypical.  At this point, we will cycle cardiac enzymes to rule out MI, but cardiac catheterization is not currently anticipated if they remain negative. Her lungs actually did not sound quite bad after a dose of Lasix in the ER, and she may need a course of diuresis briefly along with CHF teaching.  I will discuss this dosing with Dr. Daleen Squibb.  We will also change her Effient to 1 tablet daily, unsure why the patient was taking one-half tablet daily inadvertently.  Further recommendations to follow per Dr. Daleen Squibb.  It should also be stressed to the patient's discharge that we will not be providing her pain medication and that she is strongly encouraged to follow up with one pain clinic for this.     Dayna Dunn, P.A.C.   ______________________________ Jesse Sans Daleen Squibb, MD, [facility]    DD/MEDQ  D:  02/10/2011  T:  02/10/2011  Job:  [id number]  cc:   Ramon Dredge L. [name], M.D.  Electronically Signed by Ronie Spies  on 02/23/2011 01:35:50 PM Electronically Signed by Valera Castle MD [facility] on 02/24/2011 07:58:44 AM

## 2011-02-23 NOTE — Discharge Summary (Addendum)
[name], [name]               ACCOUNT NO.:  [id number]  MEDICAL RECORD NO.:  [id number]           PATIENT TYPE:  I  LOCATION:  3706                         FACILITY:  [facility]  PHYSICIAN:  Rollene Rotunda, MD, FACCDATE OF BIRTH:  [birth year]  DATE OF ADMISSION:  02/10/2011 DATE OF DISCHARGE:  02/11/2011                              DISCHARGE SUMMARY   DISCHARGE DIAGNOSES: 1. Chest pain without objective evidence for ischemia, negative     cardiac enzymes. 2. Dyspnea, gently diuresed with weight yesterday of 106.7 kg. 3. Coronary artery disease.     a.     Statu post coronary artery bypass graft in June 2005 with      left internal mammary artery to the large diagonal, saphenous vein      graft to the left anterior descending artery, saphenous vein graft      to the circumflex and saphenous vein graft to the posterior      descending artery.     b.     Bare-metal stent and percutaneous coronary intervention in      May 2010 to the vein graft to the posterior descending artery.     c.     Non-ST segment elevation myocardial infarction in November      2011, status post percutaneous transluminal coronary      angioplasty/drug-eluting stent placement to the mid implantable      cardioverter-defibrillator to the posterior descending artery in      the previously stented area.     d.     Most recent catheterization on November 01, 2010 showing      known 2/4 grafts occluded, stable coronary anatomy, for continued      medical therapy. 4. Ischemic cardiomyopathy with ejection fraction of 40% by     catheterization in October 24, 2010. 5. Hyperlipidemia. 6. Gastroesophageal reflux disease. 7. Mild-to-moderate obstructive sleep apnea diagnosed in April 2012 by     sleep study. 8. Chronic pain, possible drug-seeking component. 9. Obesity with BMI of 41. 10.Reported chronic obstructive pulmonary disease with history of     tobacco abuse, relatively normal PFTs with moderately reduced    diffusing capacity of lung for carbon monoxide in April 2012. 11.Anxiety. 12.Peripheral vascular disease with bilateral carotid stenosis, 50-69%     in 2011. 13.Remote seizure disorder. 14.History of diverticulitis. 15.Retinopathy and ventricular tachycardia history. 16.Hypothyroidism with euthyroid, TSH of 4.172 this admission.  HOSPITAL COURSE:  [name] is a 55 year old female with a history of known CAD, status post CABG in 2005 with ischemic cardiomyopathy, obesity and chronic pain who presented to [facility] with complaints of chest pain and shortness of breath.  At 4 a.m. on day of admission, she felt sharp cramp-like substernal shooting pains radiating across with her right and left shoulders with increasing shortness of breath.  Her symptoms were alleviated with oxygen, 2 sprays of nitroglycerin and aspirin, but the patient states the morphine also helped her substantially.  She received 8 mg of morphine as well as Toradol in the ER.  She endorsed chronic dyspnea on exertion as well as orthopnea, which was  unchanged for her.  She denied any weight gain. Chest x-ray demonstrated possible developing interstitial edema consistent with worsening CHF and pro-BNP was slightly elevated at 2796. She was initially given 40 mg of IV Lasix in the ER and urinated quite a bit, and upon exam, her lungs were quite clear.  She was admitted to hospital for rule out in brief course of IV Lasix, which included 40 mg IV b.i.d.  Her cardiac enzymes remained negative and EKG was nonacute. On day of discharge, she had slept well and had no further pain and breathing was unlabored.  She was satting 96% on room air.  Dr. Antoine Poche has seen and examined her today and felt she is stable for discharge.  There was concern that the patient had some history of drug-seeking behavior.  Prior records on outpatient office notes indicate that she had been adamant about pain control before.  She was  not entirely clear on who is suppling her hydrocodone/APAP outside of the hospital, but states she may have been getting it from her pulmonologist.  DISCHARGE LABS:  WBC 6.0, hemoglobin 11.0, hematocrit 33.3, platelet count 211.  Sodium 138, potassium 4.0, chloride 100, CO2 30, glucose 144, BUN 15, creatinine 0.67.  LFTs showed normal except for ALT of 38 and indirect bilirubin level at 0.2, magnesium 2.3.  Cardiac enzymes were negative x4.  Pro-BNP 2096.  STUDIES: 1. Chest x-ray on Feb 10, 2011 showed cardiac enlargement with     increased pulmonary vascularity and developing interstitial edema     consistent with worsening congestive change.  Stable around and V-     like metallic densities in the upper chest, seen on prior chest x-     ray.  DISCHARGE MEDICATIONS: 1. Aspirin 81 mg daily. 2. Effient 10 mg daily. 3. Ambien 10 mg half tablet daily nightly p.r.n. 4. Artificial tears one drop both eyes daily as needed. 5. Benazepril 20 mg every morning. 6. Carbamazepine 200 mg b.i.d. 7. Hydrocodone/APAP 10/325 mg one tablet q.i.d. 8. Lasix 40 mg daily. 9. Lovastatin 40 mg two tablet daily nightly. 10.Metoprolol succinate 50 mg daily. 11.Multivitamin/iron one tablet b.i.d. 12.Nexium 40 mg daily. 13.Nitroglycerin sublingual 0.4 mg every 5 minutes as needed up to     three doses for chest pain. 14.Prozac 20 mg daily. 15.Synthroid 50 mcg daily. 16.Xanax 1 mg q.i.d. p.r.n.  Please note the patient was inadvertently taking one and a half tablet of Effient daily for unclear reasons.  She was instructed to cut this down to the normal dose of one capsule daily.  DISPOSITION:  [name] will be discharged in stable condition to home. She was instructed to increase activity slowly and follow a low-salt heart-healthy diet.  She will follow up with Joni Reining, NP at [facility] in [place] on Feb 25, 2011 at 11:40 a.m.  Her blood sugar was also slightly elevated here in  the hospital ranging from 114-144.  Previous A1c was 6.1 in November, and she was instructed follow up with PCP to continue to monitor this for progression to diabetes.  DURATION OF DISCHARGE ENCOUNTER:  Greater than 30 minutes including physician and PA time.     Ronie Spies, P.A.C.   ______________________________ Rollene Rotunda, MD, [facility]    DD/MEDQ  D:  02/11/2011  T:  02/11/2011  Job:  [id number]  cc:   Ramon Dredge L. [name], M.D. Thomas C. Daleen Squibb, MD, [facility]  Electronically Signed by Ronie Spies  on 02/23/2011 01:35:48 PM Electronically Signed by Rollene Rotunda  MD [facility] on 03/25/2011 11:38:19 AM

## 2011-02-24 NOTE — Discharge Summary (Signed)
  NAME:  [name], [name]               ACCOUNT NO.:  [id number]  MEDICAL RECORD NO.:  [id number]           PATIENT TYPE:  I  LOCATION:  3706                         FACILITY:  [facility]  PHYSICIAN:  Lyrique Hakim C. Sunni Richardson, MD, FACCDATE OF BIRTH:  1956/08/07  DATE OF ADMISSION:  02/10/2011 DATE OF DISCHARGE:  02/12/2011                              DISCHARGE SUMMARY   ADDENDUM  Dr. Antoine Poche examined the patient on 02/11/2011 and initially cleared her for discharge.  The patient stated she felt worse and was dizzy.  Her vital signs showed a slightly elevated blood pressure 156/66.  She was held overnight and monitored closely.  On Feb 12, 2011, Dr. Daleen Squibb examined [name].  He adjusted her medications but felt that she could be safely discharged home, to follow up as an outpatient as scheduled.  DISCHARGE INSTRUCTIONS:  Unchanged.  DISCHARGE MEDICATIONS:  Unchanged with the exception of Lasix is changed from 40 to 80 mg daily and she has K-Dur 20 mEq daily added to her medication regimen.  Additionally, she was given a prescription for Xanax for 30 days.  All other discharge planning and medications are unchanged.     Theodore Demark, PA-C   ______________________________ Jesse Sans Daleen Squibb, MD, [facility]    RB/MEDQ  D:  02/12/2011  T:  02/12/2011  Job:  [id number]  Electronically Signed by Theodore Demark PA-C on 02/13/2011 03:40:44 PM Electronically Signed by Valera Castle MD [facility] on 02/24/2011 07:58:39 AM

## 2011-02-25 ENCOUNTER — Ambulatory Visit: Payer: Medicaid Other | Admitting: Adult Health

## 2011-03-10 ENCOUNTER — Encounter: Payer: Self-pay | Admitting: Cardiology

## 2011-03-10 ENCOUNTER — Ambulatory Visit: Payer: Medicaid Other | Admitting: Cardiology

## 2011-03-10 ENCOUNTER — Ambulatory Visit (INDEPENDENT_AMBULATORY_CARE_PROVIDER_SITE_OTHER): Payer: Medicaid Other | Admitting: Cardiology

## 2011-03-10 DIAGNOSIS — R079 Chest pain, unspecified: Secondary | ICD-10-CM

## 2011-03-10 DIAGNOSIS — I2510 Atherosclerotic heart disease of native coronary artery without angina pectoris: Secondary | ICD-10-CM

## 2011-03-10 NOTE — Assessment & Plan Note (Signed)
Improved

## 2011-03-10 NOTE — Patient Instructions (Signed)
**Note De-identified [name] Obfuscation** Your physician recommends that you continue on your current medications as directed. Please refer to the Current Medication list given to you today.  Your physician recommends that you schedule a follow-up appointment in: 6 months  

## 2011-03-10 NOTE — Progress Notes (Signed)
HPI [name] returns today after being hospitalized in May for atypical chest pain. She has known coronary disease but ruled out. We were concerned she may have had some mild fluid overload and she responded to diuresis.  She has multiple complaints today which is not unusual. She says she feels hot all the time her skin feels cold and she can't sleep. Of note her TSH was normal in the hospital in May.  She's scheduled for sleep study again tomorrow night. They think she may need CPAP. She is chronically fatigued.   On a bright note, she is taking her medication and has lost 45 pounds!     Past Medical History  Diagnosis Date  . Coronary artery disease   . Hypertension   . COPD (chronic obstructive pulmonary disease)   . Hyperlipidemia   . Anxiety and depression   . GERD (gastroesophageal reflux disease)   . Seizure disorder   . Chronic low back pain   . Diverticulitis   . Kidney stones     Past Surgical History  Procedure Date  . Coronary artery bypass graft 2005    LIMA to AL, SVG to LAD, SVG to Cx, SVG-PDA  . Carotid endarterectomy 2005  . Left knee repair   . Right shoulder and elbow repair   . Abdominal hysterectomy     Family History  Problem Relation Age of Onset  . Heart attack Mother   . Heart disease Mother   . Heart attack Father   . Heart disease Father   . Colon cancer Neg Hx     History   Social History  . Marital Status: Legally Separated    Spouse Name: N/A    Number of Children: 3  . Years of Education: N/A   Occupational History  . diabled    Social History Main Topics  . Smoking status: Former Smoker -- 1.5 packs/day for 15 years    Types: Cigarettes    Quit date: 02/05/2010  . Smokeless tobacco: Never Used  . Alcohol Use: No  . Drug Use: No  . Sexually Active: No   Other Topics Concern  . Not on file   Social History Narrative  . No narrative on file    No Known Allergies  Current Outpatient Prescriptions  Medication Sig  Dispense Refill  . ALPRAZolam (XANAX) 1 MG tablet Take 1 mg by mouth at bedtime as needed.        Marland Kitchen aspirin 325 MG tablet Take 325 mg by mouth daily.        . benazepril (LOTENSIN) 20 MG tablet Take 20 mg by mouth daily.        . carbamazepine (TEGRETOL XR) 200 MG 12 hr tablet Take 200 mg by mouth 2 (two) times daily.        Marland Kitchen esomeprazole (NEXIUM) 40 MG capsule Take 40 mg by mouth daily before breakfast.        . FLUoxetine (PROZAC) 20 MG tablet Take 20 mg by mouth daily.        Marland Kitchen HYDROcodone-acetaminophen (VICODIN) 5-500 MG per tablet Take 1 tablet by mouth every 4 (four) hours as needed for pain.  20 tablet  0  . levothyroxine (SYNTHROID, LEVOTHROID) 50 MCG tablet Take 50 mcg by mouth daily.        Marland Kitchen lovastatin (MEVACOR) 40 MG tablet Take 40 mg by mouth 2 (two) times daily.        . metoprolol (TOPROL-XL) 50 MG 24 hr tablet  Take 50 mg by mouth daily.        . naproxen sodium (ALEVE) 220 MG tablet Take 220 mg by mouth as needed.        . nitroGLYCERIN (NITROSTAT) 0.4 MG SL tablet Place 0.4 mg under the tongue every 5 (five) minutes as needed.        . prasugrel (EFFIENT) 10 MG TABS Take 10 mg by mouth daily.        Marland Kitchen zolpidem (AMBIEN) 5 MG tablet Take 1 tablet (5 mg total) by mouth at bedtime as needed for sleep.  60 tablet  0  . DISCONTD: traMADol (ULTRAM) 50 MG tablet Take 1 tablet (50 mg total) by mouth 2 (two) times daily as needed for pain.  60 tablet  1  . DISCONTD: traZODone (DESYREL) 50 MG tablet Take 50 mg by mouth at bedtime.          ROS Negative other than HPI.   PE General Appearance: well developed, well nourished in no acute distress, Obese HEENT: symmetrical face, PERRLA, good dentition  Neck: no JVD, thyromegaly, or adenopathy, trachea midline Chest: symmetric without deformity Cardiac: PMI non-displaced, RRR, normal S1, S2, no gallop or murmur Lung: clear to ausculation and percussion Vascular: all pulses full without bruits  Abdominal: nondistended, nontender, good  bowel sounds, no HSM, no bruits Extremities: no cyanosis, clubbing or edema, no sign of DVT, no varicosities  Skin: normal color, no rashes Neuro: alert and oriented x 3, non-focal Pysch: normal affect Filed Vitals:   03/10/11 1616  BP: 105/58  Pulse: 58  Height: 5\' 3"  (1.6 m)  Weight: 229 lb (103.874 kg)    EKG  Labs and Studies Reviewed.   Lab Results  Component Value Date   WBC 6.0 02/11/2011   HGB 11.0* 02/11/2011   HCT 33.3* 02/11/2011   MCV 91.0 02/11/2011   PLT 211 02/11/2011      Chemistry      Component Value Date/Time   NA 138 02/11/2011 0903   K 4.0 02/11/2011 0903   CL 100 02/11/2011 0903   CO2 30 02/11/2011 0903   BUN 15 02/11/2011 0903   CREATININE 0.67 02/11/2011 0903      Component Value Date/Time   CALCIUM 9.6 02/11/2011 0903   ALKPHOS 74 02/10/2011 1611   AST 28 02/10/2011 1611   ALT 38* 02/10/2011 1611   BILITOT 0.3 02/10/2011 1611       Lab Results  Component Value Date   CHOL  Value: 169        ATP III CLASSIFICATION:  <200     mg/dL   Desirable  784-696  mg/dL   Borderline High  >=295    mg/dL   High        2/84/1324   CHOL  Value: 201        ATP III CLASSIFICATION:  <200     mg/dL   Desirable  401-027  mg/dL   Borderline High  >=253    mg/dL   High       * 66/01/4033   CHOL 200 06/28/2009   Lab Results  Component Value Date   HDL 42 10/31/2010   HDL 41 74/11/5954   HDL 35* 06/28/2009   Lab Results  Component Value Date   LDLCALC  Value: 55        Total Cholesterol/HDL:CHD Risk Coronary Heart Disease Risk Table  Men   Women  1/2 Average Risk   3.4   3.3  Average Risk       5.0   4.4  2 X Average Risk   9.6   7.1  3 X Average Risk  23.4   11.0        Use the calculated Patient Ratio above and the CHD Risk Table to determine the patient's CHD Risk.        ATP III CLASSIFICATION (LDL):  <100     mg/dL   Optimal  272-536  mg/dL   Near or Above                    Optimal  130-159  mg/dL   Borderline  644-034  mg/dL   High  >742     mg/dL   Very High  5/95/6387   LDLCALC  Value: 100        Total Cholesterol/HDL:CHD Risk Coronary Heart Disease Risk Table                     Men   Women  1/2 Average Risk   3.4   3.3  Average Risk       5.0   4.4  2 X Average Risk   9.6   7.1  3 X Average Risk  23.4   11.0        Use the calculated Patient Ratio above and the CHD Risk Table to determine the patient's CHD Risk.        ATP III CLASSIFICATION (LDL):  <100     mg/dL   Optimal  564-332  mg/dL   Near or Above                    Optimal  130-159  mg/dL   Borderline  951-884  mg/dL   High  >166     mg/dL   Very High* 03/08/159   LDLCALC 97 06/28/2009   Lab Results  Component Value Date   TRIG 358* 10/31/2010   TRIG 302* 08/09/2010   TRIG 342* 06/28/2009   Lab Results  Component Value Date   CHOLHDL 4.0 10/31/2010   CHOLHDL 4.9 08/09/2010   CHOLHDL 5.7 Ratio 06/28/2009   Lab Results  Component Value Date   HGBA1C  Value: 6.1 (NOTE)                                                                       According to the ADA Clinical Practice Recommendations for 2011, when HbA1c is used as a screening test:   >=6.5%   Diagnostic of Diabetes Mellitus           (if abnormal result  is confirmed)  5.7-6.4%   Increased risk of developing Diabetes Mellitus  References:Diagnosis and Classification of Diabetes Mellitus,Diabetes Care,2011,34(Suppl 1):S62-S69 and Standards of Medical Care in         Diabetes - 2011,Diabetes Care,2011,34  (Suppl 1):S11-S61.* 08/08/2010   Lab Results  Component Value Date   ALT 38* 02/10/2011   AST 28 02/10/2011   ALKPHOS 74 02/10/2011   BILITOT 0.3 02/10/2011   Lab Results  Component Value Date   TSH 4.172 02/10/2011

## 2011-03-10 NOTE — Assessment & Plan Note (Signed)
Stable, no change in therapy

## 2011-03-11 ENCOUNTER — Ambulatory Visit: Payer: Medicaid Other | Attending: Pulmonary Disease

## 2011-03-11 DIAGNOSIS — G4733 Obstructive sleep apnea (adult) (pediatric): Secondary | ICD-10-CM | POA: Insufficient documentation

## 2011-03-11 DIAGNOSIS — Z6841 Body Mass Index (BMI) 40.0 and over, adult: Secondary | ICD-10-CM | POA: Insufficient documentation

## 2011-03-20 ENCOUNTER — Other Ambulatory Visit: Payer: Self-pay | Admitting: Adult Health

## 2011-03-20 NOTE — Procedures (Signed)
NAME:  [name], [name]               ACCOUNT NO.:  [id number]  MEDICAL RECORD NO.:  [id number]          PATIENT TYPE:  OUT  LOCATION:  SLEEP LAB                     FACILITY:  [facility]  PHYSICIAN:  Zenola Dezarn A. Gerilyn Pilgrim, M.D. DATE OF BIRTH:  08-31-1956  DATE OF STUDY:  03/11/2011                           NOCTURNAL POLYSOMNOGRAM  REFERRING PHYSICIAN:  EDWARD L HAWKINS  REFERRING PHYSICIAN:  Edward L. [name], MD.  INDICATIONS:  A 54-year lady who had a previous diagnostic study showing obstructive sleep apnea syndrome.  This is a titration recording.  INDICATION FOR STUDY:  EPWORTH SLEEPINESS SCORE:  MEDICATIONS:  Alprazolam, Prozac, lovastatin, Aleve, nitroglycerin, Nexium, Synthroid, aspirin, Tegretol, Toprol, hydrocodone, zolpidem p.r.n.  EPWORTH SLEEPINESS SCALE:  1.  BMI:  41.  ARCHITECTURAL SUMMARY:  The total recording time was 376 minutes.  Sleep efficiency 67.8%.  Sleep latency is 63.5 minutes.  Computer calculated the REM latency of 10 minutes, but this was reevaluated and was found to be 67 minutes instead.  Stage N1 7.6%, N2 61%, N3 0.8%, and REM sleep 28.9%.  RESPIRATORY SUMMARY:  Baseline oxygen saturation 98, lowest saturation 85.  The patient was titrated initially on CPAP between 5 and 11.  She was noted to have some central apneas, was subsequently switched to bilevel pressures, started at 13/9 to 15/10, however, she still had central apneas and on bilevel pressures and this seem not to be a significant difference.  Optimal pressure is therefore CPAP of 10.  LIMB MOVEMENT SUMMARY:  PLM index is 0.  ELECTROCARDIOGRAM SUMMARY:  Average heart rate is 58 with no significant dysrhythmias observed.  IMPRESSION:  Obstructive sleep apnea syndrome, which responds well to a CPAP of 10.  RECOMMENDATIONS:  CPAP of 10.  Thanks for this referral.  SLEEP ARCHITECTURE:  RESPIRATORY DATA:  OXYGEN DATA:  CARDIAC  DATA:  MOVEMENT-PARASOMNIA:  IMPRESSIONS-RECOMMENDATIONS:     Doak Mah A. Gerilyn Pilgrim, M.D. Electronically Signed 03/20/2011 10:12:48    KAD/MEDQ  D:  03/19/2011 09:54:47  T:  03/20/2011 00:23:21  Job:  [id number]

## 2011-03-24 ENCOUNTER — Emergency Department (HOSPITAL_COMMUNITY)
Admission: EM | Admit: 2011-03-24 | Discharge: 2011-03-24 | Disposition: A | Discharge status: Home / Self Care | Payer: Medicaid Other | Attending: Emergency Medicine | Admitting: Emergency Medicine

## 2011-03-24 ENCOUNTER — Emergency Department (HOSPITAL_COMMUNITY): Payer: Medicaid Other

## 2011-03-24 DIAGNOSIS — Z951 Presence of aortocoronary bypass graft: Secondary | ICD-10-CM | POA: Insufficient documentation

## 2011-03-24 DIAGNOSIS — R0989 Other specified symptoms and signs involving the circulatory and respiratory systems: Secondary | ICD-10-CM | POA: Insufficient documentation

## 2011-03-24 DIAGNOSIS — I6529 Occlusion and stenosis of unspecified carotid artery: Secondary | ICD-10-CM | POA: Insufficient documentation

## 2011-03-24 DIAGNOSIS — Z7982 Long term (current) use of aspirin: Secondary | ICD-10-CM | POA: Insufficient documentation

## 2011-03-24 DIAGNOSIS — R0609 Other forms of dyspnea: Secondary | ICD-10-CM | POA: Insufficient documentation

## 2011-03-24 DIAGNOSIS — Z96659 Presence of unspecified artificial knee joint: Secondary | ICD-10-CM | POA: Insufficient documentation

## 2011-03-24 DIAGNOSIS — E785 Hyperlipidemia, unspecified: Secondary | ICD-10-CM | POA: Insufficient documentation

## 2011-03-24 DIAGNOSIS — Z79899 Other long term (current) drug therapy: Secondary | ICD-10-CM | POA: Insufficient documentation

## 2011-03-24 DIAGNOSIS — Z6841 Body Mass Index (BMI) 40.0 and over, adult: Secondary | ICD-10-CM | POA: Insufficient documentation

## 2011-03-24 DIAGNOSIS — I658 Occlusion and stenosis of other precerebral arteries: Secondary | ICD-10-CM | POA: Insufficient documentation

## 2011-03-24 DIAGNOSIS — K219 Gastro-esophageal reflux disease without esophagitis: Secondary | ICD-10-CM | POA: Insufficient documentation

## 2011-03-24 DIAGNOSIS — I2589 Other forms of chronic ischemic heart disease: Secondary | ICD-10-CM | POA: Insufficient documentation

## 2011-03-24 DIAGNOSIS — E669 Obesity, unspecified: Secondary | ICD-10-CM | POA: Insufficient documentation

## 2011-03-24 DIAGNOSIS — G40802 Other epilepsy, not intractable, without status epilepticus: Secondary | ICD-10-CM | POA: Insufficient documentation

## 2011-03-24 DIAGNOSIS — E039 Hypothyroidism, unspecified: Secondary | ICD-10-CM | POA: Insufficient documentation

## 2011-03-24 DIAGNOSIS — K5730 Diverticulosis of large intestine without perforation or abscess without bleeding: Secondary | ICD-10-CM | POA: Insufficient documentation

## 2011-03-24 DIAGNOSIS — I252 Old myocardial infarction: Secondary | ICD-10-CM | POA: Insufficient documentation

## 2011-03-24 DIAGNOSIS — I2510 Atherosclerotic heart disease of native coronary artery without angina pectoris: Secondary | ICD-10-CM | POA: Insufficient documentation

## 2011-03-24 DIAGNOSIS — G4733 Obstructive sleep apnea (adult) (pediatric): Secondary | ICD-10-CM | POA: Insufficient documentation

## 2011-03-24 DIAGNOSIS — G8929 Other chronic pain: Secondary | ICD-10-CM | POA: Insufficient documentation

## 2011-03-24 DIAGNOSIS — R079 Chest pain, unspecified: Secondary | ICD-10-CM

## 2011-03-24 LAB — CBC
MCV: 90.8 fL (ref 78.0–100.0)
Platelets: 253 10*3/uL (ref 150–400)
RBC: 3.36 MIL/uL — ABNORMAL LOW (ref 3.87–5.11)
WBC: 5.9 10*3/uL (ref 4.0–10.5)

## 2011-03-24 LAB — PRO B NATRIURETIC PEPTIDE: Pro B Natriuretic peptide (BNP): 2709 pg/mL — ABNORMAL HIGH (ref 0–125)

## 2011-03-24 LAB — BASIC METABOLIC PANEL
BUN: 12 mg/dL (ref 6–23)
CO2: 28 mEq/L (ref 19–32)
Chloride: 94 mEq/L — ABNORMAL LOW (ref 96–112)
Creatinine, Ser: 0.96 mg/dL (ref 0.50–1.10)

## 2011-03-24 LAB — CK TOTAL AND CKMB (NOT AT ARMC): Relative Index: 2.7 — ABNORMAL HIGH (ref 0.0–2.5)

## 2011-03-24 LAB — DIFFERENTIAL
Eosinophils Absolute: 0.1 10*3/uL (ref 0.0–0.7)
Lymphs Abs: 2 10*3/uL (ref 0.7–4.0)
Neutro Abs: 3.2 10*3/uL (ref 1.7–7.7)
Neutrophils Relative %: 55 % (ref 43–77)

## 2011-03-24 LAB — APTT: aPTT: 31 seconds (ref 24–37)

## 2011-03-24 LAB — TROPONIN I: Troponin I: <0.3 ng/mL (ref <0.30)

## 2011-03-24 LAB — PROTIME-INR: Prothrombin Time: 13.3 seconds (ref 11.6–15.2)

## 2011-04-02 ENCOUNTER — Ambulatory Visit: Payer: Medicaid Other | Admitting: Gastroenterology

## 2011-04-03 ENCOUNTER — Other Ambulatory Visit: Payer: Self-pay | Admitting: Cardiology

## 2011-04-07 ENCOUNTER — Ambulatory Visit: Payer: Medicaid Other | Admitting: Urgent Care

## 2011-04-16 ENCOUNTER — Ambulatory Visit (INDEPENDENT_AMBULATORY_CARE_PROVIDER_SITE_OTHER): Payer: Medicaid Other | Admitting: Gastroenterology

## 2011-04-16 ENCOUNTER — Other Ambulatory Visit: Payer: Self-pay | Admitting: Gastroenterology

## 2011-04-16 ENCOUNTER — Encounter: Payer: Self-pay | Admitting: General Practice

## 2011-04-16 ENCOUNTER — Ambulatory Visit (HOSPITAL_COMMUNITY)
Admission: RE | Admit: 2011-04-16 | Discharge: 2011-04-16 | Disposition: A | Discharge status: Home / Self Care | Payer: Medicaid Other | Source: Ambulatory Visit | Attending: Gastroenterology | Admitting: Gastroenterology

## 2011-04-16 ENCOUNTER — Encounter: Payer: Self-pay | Admitting: Gastroenterology

## 2011-04-16 VITALS — BP 141/85 | HR 62 | Temp 97.2°F | Ht 63.0 in | Wt 227.4 lb

## 2011-04-16 DIAGNOSIS — R1013 Epigastric pain: Secondary | ICD-10-CM

## 2011-04-16 DIAGNOSIS — K5732 Diverticulitis of large intestine without perforation or abscess without bleeding: Secondary | ICD-10-CM

## 2011-04-16 DIAGNOSIS — R1011 Right upper quadrant pain: Secondary | ICD-10-CM | POA: Insufficient documentation

## 2011-04-16 DIAGNOSIS — R110 Nausea: Secondary | ICD-10-CM | POA: Insufficient documentation

## 2011-04-16 DIAGNOSIS — K5792 Diverticulitis of intestine, part unspecified, without perforation or abscess without bleeding: Secondary | ICD-10-CM

## 2011-04-16 DIAGNOSIS — D649 Anemia, unspecified: Secondary | ICD-10-CM

## 2011-04-16 LAB — CBC WITH DIFFERENTIAL/PLATELET
Eosinophils Absolute: 0 10*3/uL (ref 0.0–0.7)
Eosinophils Relative: 0 % (ref 0–5)
Hemoglobin: 11.6 g/dL — ABNORMAL LOW (ref 12.0–15.0)
Lymphs Abs: 2 10*3/uL (ref 0.7–4.0)
MCH: 31.2 pg (ref 26.0–34.0)
MCV: 92.7 fL (ref 78.0–100.0)
Monocytes Relative: 7 % (ref 3–12)
RBC: 3.72 MIL/uL — ABNORMAL LOW (ref 3.87–5.11)

## 2011-04-16 LAB — BASIC METABOLIC PANEL
CO2: 30 mEq/L (ref 19–32)
Calcium: 9.7 mg/dL (ref 8.4–10.5)
Chloride: 96 mEq/L (ref 96–112)
Glucose, Bld: 101 mg/dL — ABNORMAL HIGH (ref 70–99)
Sodium: 136 mEq/L (ref 135–145)

## 2011-04-16 LAB — HEPATIC FUNCTION PANEL
Alkaline Phosphatase: 101 U/L (ref 39–117)
Bilirubin, Direct: <0.1 mg/dL (ref 0.0–0.3)
Indirect Bilirubin: 0.1 mg/dL (ref 0.0–0.9)
Total Bilirubin: 0.2 mg/dL — ABNORMAL LOW (ref 0.3–1.2)

## 2011-04-16 NOTE — Patient Instructions (Signed)
Complete labs today. We will call you with results as soon as possible.  [place] of abdomen today.  We have set you up for a colonoscopy and upper endoscopy. If you have difficulty keeping liquids down or staying hydrated, you need to seek medical attention right away.

## 2011-04-17 ENCOUNTER — Other Ambulatory Visit: Payer: Self-pay

## 2011-04-17 MED ORDER — LEVOTHYROXINE SODIUM 50 MCG PO TABS: 50.0000 ug | ORAL_TABLET | Freq: Every day | ORAL | Status: DC

## 2011-04-19 ENCOUNTER — Encounter: Payer: Self-pay | Admitting: Gastroenterology

## 2011-04-19 DIAGNOSIS — R1013 Epigastric pain: Secondary | ICD-10-CM | POA: Insufficient documentation

## 2011-04-19 DIAGNOSIS — D649 Anemia, unspecified: Secondary | ICD-10-CM | POA: Insufficient documentation

## 2011-04-19 NOTE — Assessment & Plan Note (Addendum)
55 year old Caucasian female with 4 documented episodes of diverticulitis, most recently in April 2012. No prior hx of colonoscopy. Diverticulitis has resolved since last episode, s/p abx. Needs colonoscopy for lower GI tract evaluation; likely will be referred for elective surgical intervention due to continued recurrence.  Proceed with TCS with Dr. Darrick Penna in near future: the risks, benefits, and alternatives have been discussed with the patient in detail. The patient states understanding and desires to proceed. We will utilize the assistance of anesthesia, under propofol, due to pt's hx of drug abuse in remote past and polypharmacy.

## 2011-04-19 NOTE — Assessment & Plan Note (Signed)
Evaluated in 2007 by Dr. Darrick Penna for anemia while inpatient at [facility]; was recommended to return outpatient for EGD/colonoscopy. Pt never completed. Most recent Hgb from June 10.4. Appears normocytic anemia, question early iron deficiency anemia. EGD and colonoscopy to be completed in near future. Do not have hemoccult status. Will obtain ifobt and anemia panel in the interim prior to procedures.  ifobt Anemia panel EGD/TCS in near future

## 2011-04-19 NOTE — Assessment & Plan Note (Addendum)
Several week hx of epigastric and RUQ pain, without precipitating or relieving factors, associated with N/V. Recently underwent cardiac eval in June 2012 due to similar symptoms. Negative work-up. Pt has lost approximately 12 lbs since last visit in April. Hx of cholecystectomy. Per pt's report, under significant stress since pt left husband. Differentials include gastritis, PUD, hepatobiliary/pancreatic etiology less likely; question of stressors causing exacerbation of symptoms. Will evaluated with labs and proceed with upper endoscopy.  [place] abd today HFP, lipase, BMP, CBC today EGD with Dr. Darrick Penna along with TCS. The R/B/A have been discussed in detail with pt, and she states understanding. We will utilize the assistance of anesthesia for sedation due to pt's hx of drug use in the past as well as polypharmacy.

## 2011-04-19 NOTE — Progress Notes (Signed)
Referring Provider: No ref. provider found Primary Care Physician:  [facility], OT Primary Gastroenterologist: Dr. Darrick Penna   Chief Complaint  Patient presents with  . Nausea  . Emesis    HPI:   [name] is a 55 year old Caucasian female with a history of at least 4 episodes of diverticulitis, documented by CT. Most recently in April 2012. Prior to this, Aug 2009. July 2010, Oct 2011. She has also been evaluated in Oct 2007 inpatient secondary to anemia. She was instructed to return to our office for an outpatient EGD and colonoscopy; however, this was never completed.   She presents today with new onset of nausea, vomiting, epigastric and RUQ pain that "doubles her over", feels like shooting up to chest. Not r/t eating/drinking. No precipitating factors. Worse in the morning. For last few weeks, has had episode of N/V daily. Sometimes wakes her up in her sleep. She reports she is under stress, which has been escalating since she left her husband in March. Describes epigastric and RUQ pain as underlying, waxes and wanes to 10/10.  Has extensive cardiac history. Has been evaluated from a cardiac standpoint recently in June 2012.   As of note, Hgb has been trending down after review of serial labs. Most recently June 2012, Hgb 10.4. May 2012, Hgb 11.  No hematemesis, brbpr or melena. BM back to baseline.  Wt 227 today. Was 239 in April. According to cardiology note in June, had lost 45 lbs as of that date; however, I do not have documentation of this.   Past Medical History  Diagnosis Date  . Coronary artery disease   . Hypertension   . COPD (chronic obstructive pulmonary disease)   . Hyperlipidemia   . Anxiety and depression   . GERD (gastroesophageal reflux disease)   . Seizure disorder   . Chronic low back pain   . Diverticulitis   . Kidney stones   . MI (myocardial infarction) Nov 2011  . Diverticulitis     4 documented episodes by CT; most recent April 2012      Past Surgical History  Procedure Date  . Coronary artery bypass graft 2005    LIMA to AL, SVG to LAD, SVG to Cx, SVG-PDA  . Carotid endarterectomy 2005  . Left knee repair   . Right shoulder and elbow repair   . Abdominal hysterectomy   . Coronary angioplasty with stent placement 2011  . Cardiac catheterization Jan 2012    patent stents in RCA graft; CAD stable    Current Outpatient Prescriptions  Medication Sig Dispense Refill  . ALPRAZolam (XANAX) 1 MG tablet Take 1 mg by mouth at bedtime as needed.        Marland Kitchen aspirin 325 MG tablet Take 325 mg by mouth daily.        . benazepril (LOTENSIN) 20 MG tablet Take 20 mg by mouth daily.        . carbamazepine (TEGRETOL XR) 200 MG 12 hr tablet Take 200 mg by mouth 2 (two) times daily.        Marland Kitchen esomeprazole (NEXIUM) 40 MG capsule Take 40 mg by mouth daily before breakfast.        . FLUoxetine (PROZAC) 20 MG tablet Take 20 mg by mouth daily.        Marland Kitchen HYDROcodone-acetaminophen (VICODIN) 5-500 MG per tablet Take 1 tablet by mouth every 4 (four) hours as needed for pain.  20 tablet  0  . lovastatin (MEVACOR) 40 MG tablet  Take 40 mg by mouth 2 (two) times daily.        . metoprolol (TOPROL-XL) 50 MG 24 hr tablet Take 50 mg by mouth daily.        . naproxen sodium (ALEVE) 220 MG tablet Take 220 mg by mouth as needed.        Marland Kitchen NITROSTAT 0.4 MG SL tablet DISSOLVE 1 TABLET UNDER  TONGUE EVERY 5 MINUTES UPTO 15 MIN FOR CHEST PAIN.IF NO RELIEF CALL 911.  25 each  3  . prasugrel (EFFIENT) 10 MG TABS Take 10 mg by mouth daily.        Marland Kitchen zolpidem (AMBIEN) 5 MG tablet Take 1 tablet (5 mg total) by mouth at bedtime as needed for sleep.  60 tablet  0  . levothyroxine (SYNTHROID, LEVOTHROID) 50 MCG tablet Take 1 tablet (50 mcg total) by mouth daily.  30 tablet  5    Allergies as of 04/16/2011  . (No Known Allergies)    Family History  Problem Relation Age of Onset  . Heart attack Mother   . Heart disease Mother   . Breast cancer Mother   . Heart  attack Father   . Heart disease Father   . Colon cancer Neg Hx     History   Social History  . Marital Status: Legally Separated    Spouse Name: N/A    Number of Children: 3  . Years of Education: N/A   Occupational History  . disabled    Social History Main Topics  . Smoking status: Former Smoker -- 1.5 packs/day for 15 years    Types: Cigarettes    Quit date: 02/05/2010  . Smokeless tobacco: Never Used  . Alcohol Use: No     used cocaine, marijuana in past. last 7 years ago per pt   . Drug Use: No  . Sexually Active: No   Other Topics Concern  . None   Social History Narrative  . None    Review of Systems: Gen: Denies fever, chills, anorexia. Denies fatigue, weakness CV: Denies chest pain, palpitations, syncope, peripheral edema, and claudication. Resp: Denies dyspnea at rest, cough, wheezing,  GI: Denies vomiting blood, jaundice, and fecal incontinence.   Denies dysphagia or odynophagia. Derm: Denies rash, itching, dry skin Psych: Denies depression, anxiety, memory loss, confusion. No homicidal or suicidal ideation. Complains of stress Heme: Denies bruising, bleeding, and enlarged lymph nodes.  Physical Exam: BP 141/85  Pulse 62  Temp(Src) 97.2 F (36.2 C) (Temporal)  Ht 5\' 3"  (1.6 m)  Wt 227 lb 6.4 oz (103.148 kg)  BMI 40.28 kg/m2 General:   Alert and oriented. No distress noted. Pleasant and cooperative.  Head:  Normocephalic and atraumatic. Eyes:  Conjuctiva clear without scleral icterus. Mouth:  Oral mucosa pink and moist. Good dentition. No lesions. Neck:  Supple, without mass or thyromegaly. Heart:  S1, S2 present without murmurs, rubs, or gallops. Regular rate and rhythm. Lungs: clear to auscultation bilaterally without wheezes, rales, or rhonchi.  Abdomen:  +BS, obese. TTP epigastric and RUQ. Non-distended. No HSM, rebound or guarding.  Musculoskeletal: Symmetrical without gross deformities. Normal posture. Extremities:  Without  edema. Neurologic:  Alert and  oriented x4;  grossly normal neurologically. Skin:  Intact without significant lesions or rashes. Cervical Nodes:  No significant cervical adenopathy. Psych:  Alert and cooperative. Normal mood and affect.

## 2011-04-20 ENCOUNTER — Other Ambulatory Visit: Payer: Self-pay

## 2011-04-20 DIAGNOSIS — D649 Anemia, unspecified: Secondary | ICD-10-CM

## 2011-04-20 NOTE — Progress Notes (Signed)
Cc to PCP 

## 2011-04-21 ENCOUNTER — Other Ambulatory Visit: Payer: Self-pay

## 2011-04-21 DIAGNOSIS — R1013 Epigastric pain: Secondary | ICD-10-CM

## 2011-04-22 NOTE — Telephone Encounter (Signed)
Opened in error

## 2011-04-23 ENCOUNTER — Inpatient Hospital Stay (HOSPITAL_COMMUNITY): Admission: RE | Admit: 2011-04-23 | Payer: Medicaid Other | Source: Ambulatory Visit

## 2011-04-27 ENCOUNTER — Encounter: Payer: Medicaid Other | Admitting: Gastroenterology

## 2011-04-28 ENCOUNTER — Ambulatory Visit (HOSPITAL_COMMUNITY): Admission: RE | Admit: 2011-04-28 | Payer: Medicaid Other | Source: Ambulatory Visit | Admitting: Gastroenterology

## 2011-04-28 ENCOUNTER — Encounter (HOSPITAL_COMMUNITY): Admission: RE | Payer: Self-pay | Source: Ambulatory Visit

## 2011-04-28 ENCOUNTER — Encounter: Payer: Medicaid Other | Admitting: Gastroenterology

## 2011-04-28 SURGERY — COLONOSCOPY
Anesthesia: Monitor Anesthesia Care

## 2011-04-30 NOTE — Progress Notes (Signed)
Opened in error

## 2011-05-08 NOTE — Progress Notes (Signed)
AGREE

## 2011-06-22 ENCOUNTER — Ambulatory Visit: Payer: Medicaid Other | Admitting: Family Medicine

## 2011-06-23 ENCOUNTER — Encounter: Payer: Self-pay | Admitting: Family Medicine

## 2011-06-25 ENCOUNTER — Other Ambulatory Visit: Payer: Self-pay | Admitting: Cardiology

## 2011-07-01 LAB — PROTIME-INR

## 2011-07-03 LAB — URINALYSIS, ROUTINE W REFLEX MICROSCOPIC
Bilirubin Urine: NEGATIVE
Hgb urine dipstick: NEGATIVE
Nitrite: POSITIVE — AB
pH: 6.5

## 2011-07-03 LAB — URINE MICROSCOPIC-ADD ON

## 2011-07-09 ENCOUNTER — Other Ambulatory Visit: Payer: Self-pay | Admitting: Cardiology

## 2011-07-24 IMAGING — NM NM MYOCAR MULTI W/SPECT W/WALL MOTION & EF
2 series · 12 of 12 positions shown · non-contrast
Comparison: none

Ordering Physician: Forset Dicko

[place] Physician: [REDACTED]al Data: 53-year-old woman with known coronary artery
disease; now with recurrent angina.
NUCLEAR MEDICINE STRESS MYOVIEW STUDY WITH SPECT AND LEFT
VENTRICULAR EJECTION FRACTION
Radionuclide Data: One-day rest/stress protocol performed with
[DATE] mCi of Ac-55m Myoview.
Stress Data: Treadmill exercise performed to a workload of 4.6 mets
and a heart rate of 146, 87% of age predicted maximum.  Exercise
discontinued due to dyspnea and fatigue; the patient also described
chest pressure associated with moderate dyspnea, which persisted
for an number of minutes into recovery until sublingual
nitroglycerin was given.  Oxygen saturation remained 95 - 96%
throughout.  No arrhythmias noted.  Blood pressure increased from a
resting value of 20/80 to 150/90 during exercise, a normal
response.
EKG: Normal sinus rhythm with rare PVC; delayed R-wave progression;
nonspecific ST-T wave abnormality.
Stress EKG: Insignificant upsloping ST-segment depression;
prominent T-wave inversion developed in lead I in recovery without
significant ST-segment depression.
Scintigraphic Data: Acquisition notable for moderate breast
attenuation.  The left ventricle was mildly dilated.  On
tomographic images reconstructed in standard planes, there was a
moderate to large sized defect of moderate intensity involving the
inferolateral myocardium, extending from base to apex and involving
the inferoapical segment.  By comparison to the resting portion of
the study, there was substantial reversibility at the margins and
in the basilar lateral wall. A small mild focal defect was also
present in the mid septum.  No reversibility was apparent in this
region.
The gated reconstruction demonstrated air and inferoseptal
hypokinesis.  Overall LV systolic function was mildly impaired with
an estimated ejection fraction of 0.44.  There was markedly
decreased systolic accentuation of activity inferolaterally.

[Series 1: cr cardiac tc low dose · 6.41mm/px · 6 of 64 frames shown]
[frame 6/64]
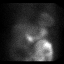
[frame 16/64]
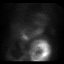
[frame 27/64]
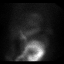
[frame 38/64]
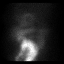
[frame 48/64]
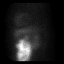
[frame 59/64]
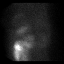

[Series 1: cs cardiac tc hi dose · 6.41mm/px · 6 of 512 frames shown]
[frame 43/512]
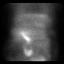
[frame 128/512]
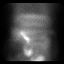
[frame 214/512]
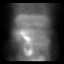
[frame 299/512]
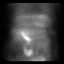
[frame 384/512]
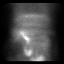
[frame 470/512]
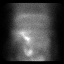

[12 of 12 positions shown; findings below may reference images not displayed]

IMPRESSION: Abnormal stress nuclear myocardial study revealing impaired
exercise capacity, a rapid increase in heart rate at low level
exercise consistent with physical deconditioning, mild left
ventricular dilatation and mildly impaired left ventricular
systolic function.  By scintigraphic imaging, there was a sizable
inferolateral infarction with substantial superimposed ischemia.
Apparent septal perfusion abnormalities likely represent breast
attenuation artifact.  Other findings as noted.

## 2011-08-19 ENCOUNTER — Encounter: Payer: Self-pay | Admitting: Cardiology

## 2011-09-04 ENCOUNTER — Other Ambulatory Visit: Payer: Self-pay | Admitting: Cardiology

## 2011-09-10 ENCOUNTER — Encounter (HOSPITAL_COMMUNITY): Payer: Self-pay | Admitting: *Deleted

## 2011-09-10 ENCOUNTER — Inpatient Hospital Stay (HOSPITAL_COMMUNITY)
Admission: EM | Admit: 2011-09-10 | Discharge: 2011-09-16 | DRG: 392 | Disposition: A | Discharge status: Home Health | Payer: Medicaid Other | Attending: Pulmonary Disease | Admitting: Pulmonary Disease

## 2011-09-10 ENCOUNTER — Emergency Department (HOSPITAL_COMMUNITY): Payer: Medicaid Other

## 2011-09-10 DIAGNOSIS — I6529 Occlusion and stenosis of unspecified carotid artery: Secondary | ICD-10-CM | POA: Diagnosis present

## 2011-09-10 DIAGNOSIS — K5792 Diverticulitis of intestine, part unspecified, without perforation or abscess without bleeding: Secondary | ICD-10-CM

## 2011-09-10 DIAGNOSIS — J449 Chronic obstructive pulmonary disease, unspecified: Secondary | ICD-10-CM | POA: Diagnosis present

## 2011-09-10 DIAGNOSIS — G40909 Epilepsy, unspecified, not intractable, without status epilepticus: Secondary | ICD-10-CM | POA: Diagnosis present

## 2011-09-10 DIAGNOSIS — I10 Essential (primary) hypertension: Secondary | ICD-10-CM | POA: Diagnosis present

## 2011-09-10 DIAGNOSIS — J4489 Other specified chronic obstructive pulmonary disease: Secondary | ICD-10-CM | POA: Diagnosis present

## 2011-09-10 DIAGNOSIS — D72829 Elevated white blood cell count, unspecified: Secondary | ICD-10-CM

## 2011-09-10 DIAGNOSIS — R569 Unspecified convulsions: Secondary | ICD-10-CM | POA: Diagnosis present

## 2011-09-10 DIAGNOSIS — I2510 Atherosclerotic heart disease of native coronary artery without angina pectoris: Secondary | ICD-10-CM | POA: Diagnosis present

## 2011-09-10 DIAGNOSIS — I209 Angina pectoris, unspecified: Secondary | ICD-10-CM | POA: Diagnosis present

## 2011-09-10 DIAGNOSIS — F411 Generalized anxiety disorder: Secondary | ICD-10-CM | POA: Diagnosis present

## 2011-09-10 DIAGNOSIS — Z951 Presence of aortocoronary bypass graft: Secondary | ICD-10-CM | POA: Diagnosis present

## 2011-09-10 DIAGNOSIS — K5732 Diverticulitis of large intestine without perforation or abscess without bleeding: Principal | ICD-10-CM | POA: Diagnosis present

## 2011-09-10 DIAGNOSIS — K219 Gastro-esophageal reflux disease without esophagitis: Secondary | ICD-10-CM | POA: Diagnosis present

## 2011-09-10 DIAGNOSIS — I739 Peripheral vascular disease, unspecified: Secondary | ICD-10-CM | POA: Diagnosis present

## 2011-09-10 LAB — URINALYSIS, ROUTINE W REFLEX MICROSCOPIC
Bilirubin Urine: NEGATIVE
Hgb urine dipstick: NEGATIVE
Specific Gravity, Urine: <1.005 — ABNORMAL LOW (ref 1.005–1.030)
Urobilinogen, UA: 0.2 mg/dL (ref 0.0–1.0)
pH: 6.5 (ref 5.0–8.0)

## 2011-09-10 LAB — BASIC METABOLIC PANEL
BUN: 21 mg/dL (ref 6–23)
CO2: 28 mEq/L (ref 19–32)
Chloride: 95 mEq/L — ABNORMAL LOW (ref 96–112)
GFR calc non Af Amer: 64 mL/min — ABNORMAL LOW (ref >90)
Glucose, Bld: 116 mg/dL — ABNORMAL HIGH (ref 70–99)
Potassium: 3.9 mEq/L (ref 3.5–5.1)
Sodium: 134 mEq/L — ABNORMAL LOW (ref 135–145)

## 2011-09-10 LAB — CBC
Hemoglobin: 11.8 g/dL — ABNORMAL LOW (ref 12.0–15.0)
MCH: 30.4 pg (ref 26.0–34.0)
Platelets: 311 10*3/uL (ref 150–400)
RBC: 3.88 MIL/uL (ref 3.87–5.11)
WBC: 13.7 10*3/uL — ABNORMAL HIGH (ref 4.0–10.5)

## 2011-09-10 LAB — DIFFERENTIAL
Lymphocytes Relative: 15 % (ref 12–46)
Lymphs Abs: 2 10*3/uL (ref 0.7–4.0)
Monocytes Relative: 6 % (ref 3–12)
Neutro Abs: 10.8 10*3/uL — ABNORMAL HIGH (ref 1.7–7.7)
Neutrophils Relative %: 79 % — ABNORMAL HIGH (ref 43–77)

## 2011-09-10 MED ORDER — SODIUM CHLORIDE 0.9 % IV SOLN: 999.0000 mL | INTRAVENOUS | Status: DC

## 2011-09-10 MED ORDER — ONDANSETRON HCL 4 MG/2ML IJ SOLN
4.0000 mg | Freq: Once | INTRAMUSCULAR | Status: AC
  Administered 2011-09-10: 4 mg via INTRAVENOUS
  Filled 2011-09-10: qty 2

## 2011-09-10 MED ORDER — IOHEXOL 300 MG/ML  SOLN
100.0000 mL | Freq: Once | INTRAMUSCULAR | Status: AC | PRN
  Administered 2011-09-10: 100 mL via INTRAVENOUS

## 2011-09-10 MED ORDER — HYDROMORPHONE HCL PF 1 MG/ML IJ SOLN
INTRAMUSCULAR | Status: AC
  Administered 2011-09-10: 1 mg
  Filled 2011-09-10: qty 1

## 2011-09-10 MED ORDER — SODIUM CHLORIDE 0.9 % IJ SOLN
INTRAMUSCULAR | Status: AC
  Administered 2011-09-10: 23:00:00
  Filled 2011-09-10: qty 3

## 2011-09-10 MED ORDER — HYDROMORPHONE HCL PF 1 MG/ML IJ SOLN
1.0000 mg | Freq: Once | INTRAMUSCULAR | Status: AC
  Administered 2011-09-10: 1 mg via INTRAVENOUS
  Filled 2011-09-10: qty 1

## 2011-09-10 MED ORDER — CIPROFLOXACIN IN D5W 400 MG/200ML IV SOLN
400.0000 mg | Freq: Once | INTRAVENOUS | Status: AC
  Administered 2011-09-10: 400 mg via INTRAVENOUS
  Filled 2011-09-10: qty 200

## 2011-09-10 MED ORDER — METRONIDAZOLE IN NACL 5-0.79 MG/ML-% IV SOLN
500.0000 mg | Freq: Once | INTRAVENOUS | Status: AC
  Administered 2011-09-10: 500 mg via INTRAVENOUS
  Filled 2011-09-10: qty 100

## 2011-09-10 NOTE — ED Notes (Signed)
Pt c/o pain in lower abdomen with left greater than right. Pt also c/o nausea and vomiting. Pt states that her last BM was 2 days ago.

## 2011-09-10 NOTE — ED Notes (Signed)
Pt reports she started feeling bloated yesterday afternoon and began vomiting this morning. States she has vomited about 6 times today and hasn't moved her bowels in 2 days. Pt reports she has diverticulitis and thinks she is having a flare-up. Pt appears to be in pain, grimacing, and guarding.  No acute distress noted.

## 2011-09-10 NOTE — ED Provider Notes (Signed)
History     CSN: [id number] Arrival date & time: 09/10/2011  5:34 PM   First MD Initiated Contact with Patient 09/10/11 1747      Chief Complaint  Patient presents with  . Abdominal Pain    (Consider location/radiation/quality/duration/timing/severity/associated sxs/prior treatment) Patient is a 55 y.o. female presenting with abdominal pain. The history is provided by the patient and the spouse. The history is limited by the condition of the patient.  Abdominal Pain The primary symptoms of the illness include abdominal pain, nausea and vomiting. The primary symptoms of the illness do not include fever, shortness of breath, diarrhea or dysuria.  Symptoms associated with the illness do not include chills, diaphoresis or back pain.   the patient is a 55 year old, female, with a history of diverticulitis, who presents to emergency department with lower abdominal pain since yesterday.  Today.  It became much more severe, and was accompanied by nausea and vomiting.  She denies diarrhea.  She denies fevers, chills, or urinary tract symptoms.  She has no chest pain, or shortness of breath or cough.  Level V caveat applies for severe pain.  An urgent need for intervention  Past Medical History  Diagnosis Date  . Coronary artery disease   . Hypertension   . COPD (chronic obstructive pulmonary disease)   . Hyperlipidemia   . Anxiety and depression   . GERD (gastroesophageal reflux disease)   . Seizure disorder   . Chronic low back pain   . Diverticulitis   . Kidney stones   . MI (myocardial infarction) Nov 2011  . Diverticulitis     4 documented episodes by CT; most recent April 2012    Past Surgical History  Procedure Date  . Coronary artery bypass graft 2005    LIMA to AL, SVG to LAD, SVG to Cx, SVG-PDA  . Carotid endarterectomy 2005  . Left knee repair   . Right shoulder and elbow repair   . Abdominal hysterectomy   . Coronary angioplasty with stent placement 2011  . Cardiac  catheterization Jan 2012    patent stents in RCA graft; CAD stable    Family History  Problem Relation Age of Onset  . Heart attack Mother   . Heart disease Mother   . Breast cancer Mother   . Heart attack Father   . Heart disease Father   . Colon cancer Neg Hx     History  Substance Use Topics  . Smoking status: Former Smoker -- 1.5 packs/day for 15 years    Types: Cigarettes    Quit date: 02/05/2010  . Smokeless tobacco: Never Used  . Alcohol Use: No     used cocaine, marijuana in past. last 7 years ago per pt     OB History    Grav Para Term Preterm Abortions TAB SAB Ect Mult Living                  Review of Systems  Constitutional: Negative for fever, chills and diaphoresis.  HENT: Negative for congestion and neck pain.   Eyes: Negative for redness.  Respiratory: Negative for cough, chest tightness and shortness of breath.   Cardiovascular: Negative for chest pain.  Gastrointestinal: Positive for nausea, vomiting and abdominal pain. Negative for diarrhea.  Genitourinary: Negative for dysuria.  Musculoskeletal: Negative for back pain.  Skin: Negative for rash.  Neurological: Negative for light-headedness, numbness and headaches.  Psychiatric/Behavioral: Negative for confusion.    Allergies  Morphine and related  Home Medications  Current Outpatient Rx  Name Route Sig Dispense Refill  . ASPIRIN 325 MG PO TABS Oral Take 325 mg by mouth daily.     Marland Kitchen FLUOXETINE HCL 20 MG PO TABS Oral Take 20 mg by mouth every morning.     Marland Kitchen MEVACOR 40 MG PO TABS  TAKE (2) TABLETS BY MOUTHAT BEDTIME. 60 each 11  . THERA M PLUS PO TABS Oral Take 1 tablet by mouth 2 (two) times daily.      Marland Kitchen POTASSIUM CHLORIDE CRYS CR 20 MEQ PO TBCR Oral Take 20 mEq by mouth daily.      Marland Kitchen ALPRAZOLAM 1 MG PO TABS Oral Take 1 mg by mouth 4 (four) times daily as needed. Take up to 4 times daily and at bedtime for sleep/anxiety    . CARBAMAZEPINE ER 200 MG PO TB12 Oral Take 200 mg by mouth 2 (two)  times daily.      Marland Kitchen ESOMEPRAZOLE MAGNESIUM 40 MG PO CPDR Oral Take 40 mg by mouth daily before breakfast.      . LOTENSIN 20 MG PO TABS  TAKE (1) TABLET BY MOUTH EACH MORNING. 30 each 12  . METOPROLOL SUCCINATE ER 50 MG PO TB24 Oral Take 50 mg by mouth daily.      Marland Kitchen NAPROXEN SODIUM 220 MG PO TABS Oral Take 220 mg by mouth as needed. For pain    . NITROSTAT 0.4 MG SL SUBL  DISSOLVE 1 TABLET UNDER TONGUE EVERY 5 MINUTES UP TO 15 MIN FOR CHESTPAIN. IF NO RELIEF CALL 911. 25 each 3  . PRASUGREL HCL 10 MG PO TABS Oral Take 10 mg by mouth daily.      Marland Kitchen ZOLPIDEM TARTRATE 5 MG PO TABS Oral Take 1 tablet (5 mg total) by mouth at bedtime as needed for sleep. 60 tablet 0    BP 156/80  Pulse 80  Temp(Src) 99.4 F (37.4 C) (Oral)  Resp 20  Ht 5\' 4"  (1.626 m)  Wt 230 lb (104.327 kg)  BMI 39.48 kg/m2  SpO2 97%  Physical Exam  Vitals reviewed. Constitutional: She is oriented to person, place, and time. She appears well-developed and well-nourished.  HENT:  Head: Normocephalic and atraumatic.  Eyes: Pupils are equal, round, and reactive to light.  Neck: Normal range of motion.  Cardiovascular: Normal rate, regular rhythm and normal heart sounds.   No murmur heard. Pulmonary/Chest: Effort normal and breath sounds normal. No respiratory distress. She has no wheezes. She has no rales.  Abdominal: Soft. She exhibits no distension and no mass. There is tenderness. There is guarding. There is no rebound.       Morbidly obese, bilateral lower quadrant tenderness, with guarding, greater on the left than on the right  Musculoskeletal: Normal range of motion. She exhibits no edema and no tenderness.  Neurological: She is alert and oriented to person, place, and time. No cranial nerve deficit.  Skin: Skin is warm and dry. No rash noted. No erythema.  Psychiatric: She has a normal mood and affect. Her behavior is normal.    ED Course  Procedures (including critical care time) 55 year old, female, with a  history of diverticulitis, and no prior abdominal surgery, presents with lower abdominal pain, which has been progressive since yesterday.  It is accompanied by nausea and vomiting.  No diarrhea, or fevers.  She has significant abdominal tenderness.  We will perform laboratory testing, and a CAT scan of her abdomen.  We'll establish an IV and treat her with analgesics and  antiemetics.   Labs Reviewed  CBC  DIFFERENTIAL  BASIC METABOLIC PANEL  URINALYSIS, ROUTINE W REFLEX MICROSCOPIC   No results found.   No diagnosis found.  I spoke with Dr. Erlinda Hong I [facility] he agreed to admit the patient to the hospital and gave admission orders to the attending nurse  MDM  Diverticulitis Leukocytosis No perforations or abscesses.  Pain, controlled in the emergency department.  No toxicity        Nicholes Stairs, MD 09/10/11 2036

## 2011-09-11 ENCOUNTER — Other Ambulatory Visit: Payer: Self-pay

## 2011-09-11 DIAGNOSIS — K5732 Diverticulitis of large intestine without perforation or abscess without bleeding: Secondary | ICD-10-CM

## 2011-09-11 MED ORDER — FUROSEMIDE 80 MG PO TABS
80.0000 mg | ORAL_TABLET | Freq: Every day | ORAL | Status: DC
  Administered 2011-09-11: 80 mg via ORAL
  Filled 2011-09-11: qty 1

## 2011-09-11 MED ORDER — ASPIRIN 325 MG PO TABS
325.0000 mg | ORAL_TABLET | Freq: Every day | ORAL | Status: DC
  Administered 2011-09-11: 325 mg via ORAL
  Filled 2011-09-11 (×2): qty 1

## 2011-09-11 MED ORDER — SODIUM CHLORIDE 0.9 % IJ SOLN
INTRAMUSCULAR | Status: AC
  Filled 2011-09-11: qty 3

## 2011-09-11 MED ORDER — LAMOTRIGINE 100 MG PO TABS
100.0000 mg | ORAL_TABLET | Freq: Two times a day (BID) | ORAL | Status: DC
  Administered 2011-09-11 – 2011-09-16 (×11): 100 mg via ORAL
  Filled 2011-09-11 (×11): qty 1

## 2011-09-11 MED ORDER — ALPRAZOLAM 1 MG PO TABS
1.0000 mg | ORAL_TABLET | Freq: Four times a day (QID) | ORAL | Status: DC | PRN
  Administered 2011-09-11 – 2011-09-15 (×4): 1 mg via ORAL
  Filled 2011-09-11 (×4): qty 1

## 2011-09-11 MED ORDER — CARBAMAZEPINE ER 100 MG PO TB12
200.0000 mg | ORAL_TABLET | Freq: Two times a day (BID) | ORAL | Status: DC
  Administered 2011-09-11 – 2011-09-16 (×11): 200 mg via ORAL
  Filled 2011-09-11 (×8): qty 2
  Filled 2011-09-11: qty 1
  Filled 2011-09-11 (×2): qty 2

## 2011-09-11 MED ORDER — HYDROMORPHONE HCL PF 1 MG/ML IJ SOLN
INTRAMUSCULAR | Status: AC
  Administered 2011-09-11: 1 mg
  Filled 2011-09-11: qty 1

## 2011-09-11 MED ORDER — SODIUM CHLORIDE 0.9 % IJ SOLN
INTRAMUSCULAR | Status: AC
  Administered 2011-09-11: 10 mL
  Filled 2011-09-11: qty 3

## 2011-09-11 MED ORDER — SIMVASTATIN 20 MG PO TABS
40.0000 mg | ORAL_TABLET | Freq: Every day | ORAL | Status: DC
  Administered 2011-09-11 – 2011-09-15 (×5): 40 mg via ORAL
  Filled 2011-09-11 (×5): qty 2

## 2011-09-11 MED ORDER — BENAZEPRIL HCL 10 MG PO TABS
20.0000 mg | ORAL_TABLET | Freq: Every day | ORAL | Status: DC
  Administered 2011-09-11 – 2011-09-16 (×5): 20 mg via ORAL
  Filled 2011-09-11 (×5): qty 2

## 2011-09-11 MED ORDER — PANTOPRAZOLE SODIUM 40 MG PO TBEC
40.0000 mg | DELAYED_RELEASE_TABLET | Freq: Every day | ORAL | Status: DC
  Administered 2011-09-11 – 2011-09-15 (×5): 40 mg via ORAL
  Filled 2011-09-11 (×5): qty 1

## 2011-09-11 MED ORDER — ENSURE CLINICAL ST REVIGOR PO LIQD
237.0000 mL | Freq: Four times a day (QID) | ORAL | Status: DC
  Administered 2011-09-12 (×2): 237 mL via ORAL
  Administered 2011-09-13: 11:00:00 via ORAL

## 2011-09-11 MED ORDER — POTASSIUM CHLORIDE CRYS ER 20 MEQ PO TBCR
20.0000 meq | EXTENDED_RELEASE_TABLET | Freq: Every day | ORAL | Status: DC
  Administered 2011-09-11 – 2011-09-16 (×6): 20 meq via ORAL
  Filled 2011-09-11 (×6): qty 1

## 2011-09-11 MED ORDER — NITROGLYCERIN 0.4 MG SL SUBL
0.4000 mg | SUBLINGUAL_TABLET | SUBLINGUAL | Status: DC | PRN
  Administered 2011-09-11: 0.4 mg via SUBLINGUAL

## 2011-09-11 MED ORDER — ENOXAPARIN SODIUM 40 MG/0.4ML ~~LOC~~ SOLN
40.0000 mg | SUBCUTANEOUS | Status: DC
  Administered 2011-09-12: 40 mg via SUBCUTANEOUS
  Filled 2011-09-11: qty 0.4

## 2011-09-11 MED ORDER — HYDROMORPHONE HCL PF 1 MG/ML IJ SOLN
1.0000 mg | INTRAMUSCULAR | Status: DC | PRN
  Administered 2011-09-11 – 2011-09-12 (×9): 1 mg via INTRAVENOUS
  Filled 2011-09-11 (×9): qty 1

## 2011-09-11 MED ORDER — ONDANSETRON HCL 4 MG/2ML IJ SOLN
4.0000 mg | Freq: Four times a day (QID) | INTRAMUSCULAR | Status: DC
  Administered 2011-09-11 – 2011-09-15 (×15): 4 mg via INTRAVENOUS
  Filled 2011-09-11 (×15): qty 2

## 2011-09-11 MED ORDER — METRONIDAZOLE IN NACL 5-0.79 MG/ML-% IV SOLN
500.0000 mg | Freq: Three times a day (TID) | INTRAVENOUS | Status: DC
  Administered 2011-09-11 – 2011-09-14 (×9): 500 mg via INTRAVENOUS
  Filled 2011-09-11 (×10): qty 100

## 2011-09-11 MED ORDER — METOPROLOL TARTRATE 50 MG PO TABS
50.0000 mg | ORAL_TABLET | Freq: Every day | ORAL | Status: DC
  Administered 2011-09-11 – 2011-09-14 (×3): 50 mg via ORAL
  Filled 2011-09-11 (×3): qty 1

## 2011-09-11 MED ORDER — LEVOTHYROXINE SODIUM 75 MCG PO TABS
75.0000 ug | ORAL_TABLET | Freq: Every day | ORAL | Status: DC
  Administered 2011-09-11 – 2011-09-16 (×5): 75 ug via ORAL
  Filled 2011-09-11 (×6): qty 1

## 2011-09-11 MED ORDER — CIPROFLOXACIN IN D5W 400 MG/200ML IV SOLN
400.0000 mg | Freq: Two times a day (BID) | INTRAVENOUS | Status: DC
  Administered 2011-09-11 – 2011-09-13 (×6): 400 mg via INTRAVENOUS
  Filled 2011-09-11 (×7): qty 200

## 2011-09-11 MED ORDER — HYDROMORPHONE HCL PF 1 MG/ML IJ SOLN
1.0000 mg | INTRAMUSCULAR | Status: DC | PRN
  Administered 2011-09-11: 1 mg via INTRAVENOUS
  Filled 2011-09-11: qty 1

## 2011-09-11 MED ORDER — NITROGLYCERIN 0.4 MG SL SUBL
SUBLINGUAL_TABLET | SUBLINGUAL | Status: AC
  Filled 2011-09-11: qty 25

## 2011-09-11 MED ORDER — ONDANSETRON HCL 4 MG/2ML IJ SOLN
4.0000 mg | Freq: Four times a day (QID) | INTRAMUSCULAR | Status: DC | PRN
Start: 2011-09-11 — End: 2011-09-16
  Administered 2011-09-15 – 2011-09-16 (×2): 4 mg via INTRAVENOUS
  Filled 2011-09-11 (×3): qty 2

## 2011-09-11 MED ORDER — FLUOXETINE HCL 20 MG PO CAPS
20.0000 mg | ORAL_CAPSULE | Freq: Every day | ORAL | Status: DC
  Administered 2011-09-11 – 2011-09-16 (×6): 20 mg via ORAL
  Filled 2011-09-11 (×6): qty 1

## 2011-09-11 MED ORDER — SODIUM CHLORIDE 0.9 % IV SOLN
INTRAVENOUS | Status: DC
  Administered 2011-09-11 – 2011-09-15 (×7): via INTRAVENOUS

## 2011-09-11 NOTE — Progress Notes (Signed)
CARE MANAGEMENT NOTE 09/11/2011  Patient:  [name], [name]   Account Number:  [id number]  Date Initiated:  09/11/2011  Documentation initiated by:  Rosemary Holms  Subjective/Objective Assessment:   Pt. admitted with abdominal pain and Diverticulitis. Receives care with the [facility] and prior to admission lived home alone.     Action/Plan:   Per patient, depending on her hospital course, she may need assistance at home upon DC   Anticipated DC Date:  09/16/2011   Anticipated DC Plan:  HOME W HOME HEALTH SERVICES      DC Planning Services  CM consult      Choice offered to / List presented to:             Status of service:  In process, will continue to follow Medicare Important Message given?   (If response is "NO", the following Medicare IM given date fields will be blank) Date Medicare IM given:   Date Additional Medicare IM given:    Discharge Disposition:    Per UR Regulation:    Comments:  09/11/11 1030 Lashannon Bresnan RN BSN CM

## 2011-09-11 NOTE — H&P (Signed)
[name] MRN: [id number] DOB/AGE: 05/08/1956 55 y.o. Primary Care Physician:[facility], OT Admit date: 09/10/2011 Chief Complaint: Diverticulitis HPI: This is a 55 year old Caucasian female who was in my office 2 days ago and feeling quite well at that point. However the next day she developed abdominal pain which became severe. She's had nausea vomiting but no diarrhea and in fact has not had a bowel movement. She came to the emergency room where she was noted to have diverticulitis. She says she's having extensive abdominal pain.  Past Medical History  Diagnosis Date  . Coronary artery disease   . Hypertension   . COPD (chronic obstructive pulmonary disease)   . Hyperlipidemia   . Anxiety and depression   . GERD (gastroesophageal reflux disease)   . Seizure disorder   . Chronic low back pain   . Diverticulitis   . Kidney stones   . MI (myocardial infarction) Nov 2011  . Diverticulitis     4 documented episodes by CT; most recent April 2012   Past Surgical History  Procedure Date  . Coronary artery bypass graft 2005    LIMA to AL, SVG to LAD, SVG to Cx, SVG-PDA  . Carotid endarterectomy 2005  . Left knee repair   . Right shoulder and elbow repair   . Abdominal hysterectomy   . Coronary angioplasty with stent placement 2011  . Cardiac catheterization Jan 2012    patent stents in RCA graft; CAD stable        Family History  Problem Relation Age of Onset  . Heart attack Mother   . Heart disease Mother   . Breast cancer Mother   . Heart attack Father   . Heart disease Father   . Colon cancer Neg Hx     Social History:  reports that she quit smoking about 19 months ago. Her smoking use included Cigarettes. She has a 22.5 pack-year smoking history. She has never used smokeless tobacco. She reports that she does not drink alcohol or use illicit drugs.   Allergies:  Allergies  Allergen Reactions  . Morphine And Related Itching     Medications Prior to Admission  Medication Dose Route Frequency Provider Last Rate Last Dose  . ALPRAZolam (XANAX) tablet 1 mg  1 mg Oral QID PRN Angus G McInnis      . aspirin tablet 325 mg  325 mg Oral Daily Angus G McInnis      . benazepril (LOTENSIN) tablet 20 mg  20 mg Oral Daily Angus G McInnis      . carbamazepine (TEGRETOL XR) 12 hr tablet 200 mg  200 mg Oral BID Angus G McInnis      . ciprofloxacin (CIPRO) IVPB 400 mg  400 mg Intravenous Once Nicholes Stairs, MD   400 mg at 09/10/11 2038  . ciprofloxacin (CIPRO) IVPB 400 mg  400 mg Intravenous BID Angus G McInnis      . enoxaparin (LOVENOX) injection 40 mg  40 mg Subcutaneous Q24H Angus G McInnis      . FLUoxetine (PROZAC) capsule 20 mg  20 mg Oral Daily Angus G McInnis      . furosemide (LASIX) tablet 80 mg  80 mg Oral Daily Angus G McInnis      . HYDROmorphone (DILAUDID) 1 MG/ML injection        1 mg at 09/10/11 2349  . HYDROmorphone (DILAUDID) 1 MG/ML injection        1 mg at 09/11/11 0317  .  HYDROmorphone (DILAUDID) injection 1 mg  1 mg Intravenous Once Nicholes Stairs, MD   1 mg at 09/10/11 1824  . HYDROmorphone (DILAUDID) injection 1 mg  1 mg Intravenous Once Nicholes Stairs, MD   1 mg at 09/10/11 2036  . HYDROmorphone (DILAUDID) injection 1 mg  1 mg Intravenous Q3H PRN Angus G McInnis   1 mg at 09/11/11 0811  . iohexol (OMNIPAQUE) 300 MG/ML solution 100 mL  100 mL Intravenous Once PRN Medication Radiologist   100 mL at 09/10/11 1929  . lamoTRIgine (LAMICTAL) tablet 100 mg  100 mg Oral BID Angus G McInnis      . levothyroxine (SYNTHROID, LEVOTHROID) tablet 75 mcg  75 mcg Oral QAC breakfast Angus G McInnis   75 mcg at 09/11/11 0811  . metoprolol (LOPRESSOR) tablet 50 mg  50 mg Oral Daily Angus G McInnis      . metroNIDAZOLE (FLAGYL) IVPB 500 mg  500 mg Intravenous Once Nicholes Stairs, MD   500 mg at 09/10/11 2231  . metroNIDAZOLE (FLAGYL) IVPB 500 mg  500 mg Intravenous Q8H Angus G McInnis      . ondansetron  (ZOFRAN) injection 4 mg  4 mg Intravenous Once Nicholes Stairs, MD   4 mg at 09/10/11 1824  . ondansetron (ZOFRAN) injection 4 mg  4 mg Intravenous Q6H PRN Angus G McInnis      . pantoprazole (PROTONIX) EC tablet 40 mg  40 mg Oral Q1200 Angus G McInnis      . potassium chloride SA (K-DUR,KLOR-CON) CR tablet 20 mEq  20 mEq Oral Daily Angus G McInnis      . simvastatin (ZOCOR) tablet 40 mg  40 mg Oral QHS Angus G McInnis      . sodium chloride 0.9 % injection           . sodium chloride 0.9 % injection           . sodium chloride 0.9 % injection        10 mL at 09/11/11 0811  . DISCONTD: 0.9 %  sodium chloride infusion  999 mL Intravenous Continuous Nicholes Stairs, MD      . DISCONTD: 0.9 %  sodium chloride infusion  999 mL Intravenous Continuous Nicholes Stairs, MD       Medications Prior to Admission  Medication Sig Dispense Refill  . ALPRAZolam (XANAX) 1 MG tablet Take 1 mg by mouth 4 (four) times daily as needed. Take up to 4 times daily and at bedtime for sleep/anxiety      . aspirin 325 MG tablet Take 325 mg by mouth daily.       . carbamazepine (TEGRETOL XR) 200 MG 12 hr tablet Take 200 mg by mouth 2 (two) times daily.        Marland Kitchen esomeprazole (NEXIUM) 40 MG capsule Take 40 mg by mouth daily before breakfast.        . FLUoxetine (PROZAC) 20 MG tablet Take 20 mg by mouth every morning.       Marland Kitchen LOTENSIN 20 MG tablet TAKE (1) TABLET BY MOUTH EACH MORNING.  30 each  12  . metoprolol (TOPROL-XL) 50 MG 24 hr tablet Take 50 mg by mouth every morning.       Marland Kitchen MEVACOR 40 MG tablet TAKE (2) TABLETS BY MOUTHAT BEDTIME.  60 each  11  . NITROSTAT 0.4 MG SL tablet DISSOLVE 1 TABLET UNDER TONGUE EVERY 5 MINUTES UP TO 15 MIN FOR CHESTPAIN. IF NO  RELIEF CALL 911.  25 each  3  . prasugrel (EFFIENT) 10 MG TABS Take 10 mg by mouth every morning.            OZH:YQMVH from the symptoms mentioned above,there are no other symptoms referable to all systems reviewed.  Physical Exam: Blood  pressure 109/70, pulse 65, temperature 97.7 F (36.5 C), temperature source Oral, resp. rate 20, height 5\' 4"  (1.626 m), weight 107.8 kg (237 lb 10.5 oz), SpO2 93.00%. She is awake and alert and looks in some acute distress. Her mucous membranes are dry. Her neck is supple without masses. Her chest is fairly clear with no wheezing. Her heart is regular without murmur gallop or rub. Her abdomen is diffusely tender with hyperactive bowel sounds. Her extremities showed no clubbing cyanosis or edema. Her central nervous system examination is grossly intact    Basename 09/10/11 1850  WBC 13.7*  NEUTROABS 10.8*  HGB 11.8*  HCT 36.1  MCV 93.0  PLT 311    Basename 09/10/11 1850  NA 134*  K 3.9  CL 95*  CO2 28  GLUCOSE 116*  BUN 21  CREATININE 0.98  CALCIUM 9.6  MG --  lablast2(ast:2,ALT:2,alkphos:2,bilitot:2,prot:2,albumin:2)@    No results found for this or any previous visit (from the past 240 hour(s)).   Ct Abdomen Pelvis W Contrast  09/10/2011  *RADIOLOGY REPORT*  Clinical Data: Low abdominal pain with nausea and vomiting. History of diverticulitis, complete hysterectomy and cholecystectomy.  CT ABDOMEN AND PELVIS WITH CONTRAST  Technique:  Multidetector CT imaging of the abdomen and pelvis was performed following the standard protocol during bolus administration of intravenous contrast.  Contrast: OMNIPAQUE IOHEXOL 300 MG/ML IV SOLN  Comparison: Abdominal ultrasound 04/16/2011.  Abdominal CT 01/15/2011.  Findings: The lung bases are clear.  There is no pleural effusion. There is no significant biliary dilatation status post cholecystectomy.  The liver, spleen and pancreas appear normal. The adrenal glands appear normal.  Kidneys are stable with central calcifications bilaterally, likely renovascular. Small calculi are considered less likely.  There is no hydronephrosis or ureteral calculus.  There is recurrent circumferential wall thickening and surrounding inflammatory change  involving the mid descending colon.  This is very similar in appearance and location to the prior study.  There is no surrounding fluid collection or free intraperitoneal air.  A small amount of free pelvic fluid is present.  There is more diffuse sigmoid colon diverticulosis without surrounding inflammation.  There is no pelvic mass status post hysterectomy.  Urinary bladder appears normal.  There is stable chronic degenerative disc disease at L5-S1.  No acute osseous findings are seen.  IMPRESSION:  1.  Recurrent mid descending colon diverticulitis.  The location and extent are very similar to the prior examination performed 8 months ago.  Therefore, an underlying mucosal lesion should be excluded. 2.  No evidence of bowel obstruction or abscess. 3.  Stable probable renovascular calcifications versus tiny calculi.  Original Report Authenticated By: Gerrianne Scale, M.D.   Impression: She has acute diverticulitis. It is noted in the x-ray reports this is in the same area where she had an episode about 8 months ago so she's probably going to need a GI evaluation as well. She has multiple other medical problems including coronary artery occlusive disease seizure disorder. Active Problems:  * No active hospital problems. *      Plan: Continue his IV antibiotics pain medications et Karie Soda and I will asked for GI evaluation  Selen Smucker L 09/11/2011, 8:54 AM

## 2011-09-11 NOTE — Progress Notes (Signed)
09/11/11 1645 Patient c/o chest pain this afternoon about 1610. Stated "it feels like angina i get at home sometimes". Stated "usually comes on all of sudden occasionally and it take nitro tablets". Rated pain at 6/10 to mid chest, given nitro 0.4 mg tablet SL x 1, O2 at 2 lpm, NS at [facility] rate already in place, and stat EKG obtained per emergency standing orders for chest pain protocol. HR - 60, R-18, BP 100/53, O2 sats at 90% on r/a. Applied O2 at 2 lpm, 94%. Denied pain after nitro tablet. Notified Dr Ouida Sills (on call for dr [name]) of EKG results and patient c/o, orders received to place pt on telemetry, fax EKG report to office, and continue Nitro PRN per order. EKG report faxed to office as requested. Nursing to monitor.

## 2011-09-11 NOTE — Consult Note (Signed)
Referring Provider: No ref. provider found Primary Care Physician:  HAWKINS Primary Gastroenterologist:  Jonette Eva  Reason for Consultation:  DIVERTICULITIS  HPI: SX STARTED 2 DAYS AGO. TAKING ASA DAILY. NO OTHER NSAIDS. This is her 4th documented at [facility] AND 1 EPISODE AT [facility] SINCE 2010. STARTED WITH ABDOMINAL  PAIN,  RADIATING TO LEGS, VOMITING, FEVER, CHILLS. NO BM. FIRST ONE IN 2-3 DAYS. TAKING FLUIDS. LAST VOMITED YESTERDAY AROUND 4 PM. RARE NAUSEA.   Past Medical History  Diagnosis Date  . Coronary artery disease   . Hypertension   . COPD (chronic obstructive pulmonary disease)   . Hyperlipidemia   . Anxiety and depression   . GERD (gastroesophageal reflux disease)   . Seizure disorder   . Chronic low back pain   . Diverticulitis   . Kidney stones   . MI (myocardial infarction) Nov 2011  . Diverticulitis     4 documented episodes by CT; most recent April 2012    Past Surgical History  Procedure Date  . Coronary artery bypass graft 2005    LIMA to AL, SVG to LAD, SVG to Cx, SVG-PDA  . Carotid endarterectomy 2005  . Left knee repair   . Right shoulder and elbow repair   . Abdominal hysterectomy   . Coronary angioplasty with stent placement 2011  . Cardiac catheterization Jan 2012    patent stents in RCA graft; CAD stable    Prior to Admission medications   Medication Sig Start Date End Date Taking? Authorizing Provider  ALPRAZolam Prudy Feeler) 1 MG tablet Take 1 mg by mouth 4 (four) times daily as needed. Take up to 4 times daily and at bedtime for sleep/anxiety   Yes Historical Provider, MD  aspirin 325 MG tablet Take 325 mg by mouth daily.    Yes Historical Provider, MD  carbamazepine (TEGRETOL XR) 200 MG 12 hr tablet Take 200 mg by mouth 2 (two) times daily.     Yes Historical Provider, MD  esomeprazole (NEXIUM) 40 MG capsule Take 40 mg by mouth daily before breakfast.     Yes Historical Provider, MD  FLUoxetine (PROZAC) 20 MG tablet Take 20 mg by mouth every morning.     Yes Historical Provider, MD  furosemide (LASIX) 80 MG tablet Take 80 mg by mouth daily.     Yes Historical Provider, MD  HYDROcodone-acetaminophen (NORCO) 10-325 MG per tablet Take 1 tablet by mouth every 6 (six) hours as needed. For pain    Yes Historical Provider, MD  lamoTRIgine (LAMICTAL) 100 MG tablet Take 100 mg by mouth 2 (two) times daily.     Yes Historical Provider, MD  levothyroxine (SYNTHROID, LEVOTHROID) 75 MCG tablet Take 75 mcg by mouth daily.     Yes Historical Provider, MD  LOTENSIN 20 MG tablet TAKE (1) TABLET BY MOUTH EACH MORNING. 06/25/11  Yes Valera Castle, MD  metoprolol (TOPROL-XL) 50 MG 24 hr tablet Take 50 mg by mouth every morning.    Yes Historical Provider, MD  MEVACOR 40 MG tablet TAKE (2) TABLETS BY MOUTHAT BEDTIME. 07/09/11  Yes Valera Castle, MD  Multiple Vitamins-Minerals (MULTIVITAMINS THER. W/MINERALS) TABS Take 1 tablet by mouth 2 (two) times daily.     Yes Historical Provider, MD  NITROSTAT 0.4 MG SL tablet DISSOLVE 1 TABLET UNDER TONGUE EVERY 5 MINUTES UP TO 15 MIN FOR CHESTPAIN. IF NO RELIEF CALL 911. 09/04/11  Yes Valera Castle, MD  potassium chloride SA (K-DUR,KLOR-CON) 20 MEQ tablet Take 20 mEq by mouth daily.  Yes Historical Provider, MD  prasugrel (EFFIENT) 10 MG TABS Take 10 mg by mouth every morning.    Yes Historical Provider, MD    Current Facility-Administered Medications  Medication Dose Route Frequency Provider Last Rate Last Dose  . ALPRAZolam (XANAX) tablet 1 mg  1 mg Oral QID PRN Angus G McInnis   1 mg at 09/11/11 1057  . aspirin tablet 325 mg  325 mg Oral Daily Angus G McInnis   325 mg at 09/11/11 1049  . benazepril (LOTENSIN) tablet 20 mg  20 mg Oral Daily Angus G McInnis   20 mg at 09/11/11 1050  . carbamazepine (TEGRETOL XR) 12 hr tablet 200 mg  200 mg Oral BID Angus G McInnis   200 mg at 09/11/11 1050  . ciprofloxacin (CIPRO) IVPB 400 mg  400 mg Intravenous Once Nicholes Stairs, MD   400 mg at 09/10/11 2038  . ciprofloxacin (CIPRO)  IVPB 400 mg  400 mg Intravenous BID Angus G McInnis   400 mg at 09/11/11 1217  . enoxaparin (LOVENOX) injection 40 mg  40 mg Subcutaneous Q24H Angus G McInnis      . FLUoxetine (PROZAC) capsule 20 mg  20 mg Oral Daily Angus G McInnis   20 mg at 09/11/11 1050  . furosemide (LASIX) tablet 80 mg  80 mg Oral Daily Angus G McInnis   80 mg at 09/11/11 1049  . HYDROmorphone (DILAUDID) 1 MG/ML injection        1 mg at 09/10/11 2349  . HYDROmorphone (DILAUDID) 1 MG/ML injection        1 mg at 09/11/11 0317  . HYDROmorphone (DILAUDID) injection 1 mg  1 mg Intravenous Once Nicholes Stairs, MD   1 mg at 09/10/11 1824  . HYDROmorphone (DILAUDID) injection 1 mg  1 mg Intravenous Once Nicholes Stairs, MD   1 mg at 09/10/11 2036  . HYDROmorphone (DILAUDID) injection 1 mg  1 mg Intravenous Q2H PRN Fredirick Maudlin   1 mg at 09/11/11 1047  . iohexol (OMNIPAQUE) 300 MG/ML solution 100 mL  100 mL Intravenous Once PRN Medication Radiologist   100 mL at 09/10/11 1929  . lamoTRIgine (LAMICTAL) tablet 100 mg  100 mg Oral BID Angus G McInnis   100 mg at 09/11/11 1057  . levothyroxine (SYNTHROID, LEVOTHROID) tablet 75 mcg  75 mcg Oral QAC breakfast Angus G McInnis   75 mcg at 09/11/11 0811  . metoprolol (LOPRESSOR) tablet 50 mg  50 mg Oral Daily Angus G McInnis   50 mg at 09/11/11 1050  . metroNIDAZOLE (FLAGYL) IVPB 500 mg  500 mg Intravenous Once Nicholes Stairs, MD   500 mg at 09/10/11 2231  . metroNIDAZOLE (FLAGYL) IVPB 500 mg  500 mg Intravenous Q8H Angus G McInnis   500 mg at 09/11/11 1048  . ondansetron (ZOFRAN) injection 4 mg  4 mg Intravenous Once Nicholes Stairs, MD   4 mg at 09/10/11 1824  . ondansetron (ZOFRAN) injection 4 mg  4 mg Intravenous Q6H PRN Angus G McInnis      . pantoprazole (PROTONIX) EC tablet 40 mg  40 mg Oral Q1200 Angus G McInnis   40 mg at 09/11/11 1136  . potassium chloride SA (K-DUR,KLOR-CON) CR tablet 20 mEq  20 mEq Oral Daily Angus G McInnis   20 mEq at 09/11/11 1050  .  simvastatin (ZOCOR) tablet 40 mg  40 mg Oral QHS Angus G McInnis      . sodium chloride 0.9 %  injection           . sodium chloride 0.9 % injection           . sodium chloride 0.9 % injection        10 mL at 09/11/11 0811  . DISCONTD: 0.9 %  sodium chloride infusion  999 mL Intravenous Continuous Nicholes Stairs, MD      . DISCONTD: 0.9 %  sodium chloride infusion  999 mL Intravenous Continuous Nicholes Stairs, MD      . DISCONTD: HYDROmorphone (DILAUDID) injection 1 mg  1 mg Intravenous Q3H PRN Angus G McInnis   1 mg at 09/11/11 1610    Allergies as of 09/10/2011 - Review Complete 09/10/2011  Allergen Reaction Noted  . Morphine and related Itching 09/10/2011    Family History:  Colon Cancer  NEG                           Polyps  NEG   MOM HAD BREAST CANCER   History   Social History  . Marital Status: Legally Separated    Spouse Name: N/A    Number of Children: 3  . Years of Education: N/A   Occupational History  . disabled    Social History Main Topics  . Smoking status: Former Smoker -- 1.5 packs/day for 15 years    Types: Cigarettes    Quit date: 02/05/2010  . Smokeless tobacco: Never Used  . Alcohol Use: No     used cocaine, marijuana in past. last 7 years ago per pt   . Drug Use: No  . Sexually Active: No   Other Topics Concern  . Not on file   Social History Narrative  . No narrative on file    Review of Systems: NOT HAVING DAILY VOMITING. BETTER SINCE LESS STRESS. NO DYSPHAGIA OR HEART BURN AS LONG AS SHE TAKES HER NEXIUM. PER HPI OTHERWISE ALL SYSTEMS NEGATIVE.   Vitals: Blood pressure 109/70, pulse 65, temperature 97.7 F (36.5 C), temperature source Oral, resp. rate 20, height 5\' 4"  (1.626 m), weight 237 lb 10.5 oz (107.8 kg), SpO2 93.00%.  Physical Exam: General:   Alert,  pleasant and cooperative in NAD Head:  Normocephalic and atraumatic. Mouth:  No deformity or lesions, dentition normal. Neck:  Supple;  Lungs:  Clear throughout to  auscultation.   No wheezes, crackles, or rhonchi. No acute distress. Heart:  Regular rate and rhythm Abdomen:  Soft,  nondistended. MILD TO MOD TTP IN LUQ AND LLQ, Normal bowel sounds, without guarding, and with MILD rebound IN LUQ/LLQ.   Msk:  Symmetrical without gross deformities. Normal posture. Extremities:  Without edema. Neurologic:  Alert and  oriented x4;  grossly normal neurologically. Skin:  Intact without significant lesions or rashes. Cervical Nodes:  No significant cervical adenopathy.  Lab Results:  [facility] 09/10/11 1850  WBC 13.7*  HGB 11.8*  HCT 36.1  PLT 311   BMET  Basename 09/10/11 1850  NA 134*  K 3.9  CL 95*  CO2 28  GLUCOSE 116*  BUN 21  CREATININE 0.98  CALCIUM 9.6   Studies/Results: CT ABD/PELVIS: DESCENDING COLON DIVERTICULITIS  Impression: ACUTE UNCOMPLICATED DIVERTICULITIS, PAIN SAME AS YESTERDAY, NOW WITH BM AND VOMITING RESOLVED.   Plan: ABX FOR 10 DAYS OPV IN1 MO. SCHEDULE OP TCS AT THAT TIME. PT NEED REFERRAL TO DR. Lovell Sheehan FOR LEFT HEMICOLECTOMY. WILL SHCEDULE AFTER TCS.  SUPPORTIVE CARE. FULL LIQUID DIET. ENSURE TID.  LOS: 1 day   [facility]  09/11/2011, 12:46 PM

## 2011-09-12 LAB — CBC
MCH: 30.7 pg (ref 26.0–34.0)
MCV: 94.6 fL (ref 78.0–100.0)
Platelets: 206 10*3/uL (ref 150–400)
RBC: 2.96 MIL/uL — ABNORMAL LOW (ref 3.87–5.11)

## 2011-09-12 LAB — BASIC METABOLIC PANEL
BUN: 18 mg/dL (ref 6–23)
Calcium: 8.5 mg/dL (ref 8.4–10.5)
Creatinine, Ser: 1.53 mg/dL — ABNORMAL HIGH (ref 0.50–1.10)
GFR calc non Af Amer: 37 mL/min — ABNORMAL LOW (ref >90)
Glucose, Bld: 98 mg/dL (ref 70–99)
Sodium: 132 mEq/L — ABNORMAL LOW (ref 135–145)

## 2011-09-12 LAB — DIFFERENTIAL
Eosinophils Absolute: 0.1 10*3/uL (ref 0.0–0.7)
Eosinophils Relative: 2 % (ref 0–5)
Lymphs Abs: 2 10*3/uL (ref 0.7–4.0)
Monocytes Absolute: 0.7 10*3/uL (ref 0.1–1.0)
Monocytes Relative: 10 % (ref 3–12)

## 2011-09-12 MED ORDER — ONDANSETRON HCL 4 MG/2ML IJ SOLN: 4.0000 mg | Freq: Four times a day (QID) | INTRAMUSCULAR | Status: DC | PRN

## 2011-09-12 MED ORDER — HYDROMORPHONE 0.3 MG/ML IV SOLN
INTRAVENOUS | Status: AC
  Administered 2011-09-12: 0.6 mg
  Filled 2011-09-12: qty 25

## 2011-09-12 MED ORDER — SODIUM CHLORIDE 0.9 % IJ SOLN: 9.0000 mL | INTRAMUSCULAR | Status: DC | PRN

## 2011-09-12 MED ORDER — DIPHENHYDRAMINE HCL 50 MG/ML IJ SOLN: 12.5000 mg | Freq: Four times a day (QID) | INTRAMUSCULAR | Status: DC | PRN

## 2011-09-12 MED ORDER — DIPHENHYDRAMINE HCL 12.5 MG/5ML PO ELIX: 12.5000 mg | ORAL_SOLUTION | Freq: Four times a day (QID) | ORAL | Status: DC | PRN

## 2011-09-12 MED ORDER — HYDROMORPHONE 0.3 MG/ML IV SOLN
INTRAVENOUS | Status: DC
  Administered 2011-09-12: 3 mg via INTRAVENOUS
  Administered 2011-09-12: 3.3 mg via INTRAVENOUS
  Administered 2011-09-12 (×2): via INTRAVENOUS
  Administered 2011-09-13: 1.85 mg via INTRAVENOUS
  Administered 2011-09-13: 1.65 mg via INTRAVENOUS
  Administered 2011-09-13: 2.21 mg via INTRAVENOUS
  Administered 2011-09-13: 0.6 mg via INTRAVENOUS
  Administered 2011-09-13: 1.8 mg via INTRAVENOUS
  Administered 2011-09-13: 11:00:00 via INTRAVENOUS
  Administered 2011-09-13: 2.85 mg via INTRAVENOUS
  Administered 2011-09-14: 1.8 mg via INTRAVENOUS
  Administered 2011-09-14: 0.3 mg via INTRAVENOUS

## 2011-09-12 MED ORDER — HYDROMORPHONE 0.3 MG/ML IV SOLN
INTRAVENOUS | Status: AC
  Filled 2011-09-12: qty 25

## 2011-09-12 MED ORDER — SODIUM CHLORIDE 0.9 % IJ SOLN
INTRAMUSCULAR | Status: AC
  Administered 2011-09-12: 10 mL
  Filled 2011-09-12: qty 3

## 2011-09-12 MED ORDER — NALOXONE HCL 0.4 MG/ML IJ SOLN: 0.4000 mg | INTRAMUSCULAR | Status: DC | PRN

## 2011-09-12 NOTE — Progress Notes (Signed)
Subjective: She had an episode of chest discomfort yesterday that responded well to one nitroglycerin. Her EKG showed some lateral ischemia but nothing that looked like an acute infarction. She has not had anymore pain. She feels okay now. She has no other new complaints. She continues to have it on discomfort. She's had some diarrhea now.  Objective: Vital signs in last 24 hours: Temp:  [97.1 F (36.2 C)-98.5 F (36.9 C)] 97.9 F (36.6 C) (12/08 0925) Pulse Rate:  [53-72] 72  (12/08 0925) Resp:  [16-20] 18  (12/08 0925) BP: (88-126)/(53-68) 96/56 mmHg (12/08 0925) SpO2:  [90 %-98 %] 98 % (12/08 0925) Weight change:  Last BM Date: 09/12/11  Intake/Output from previous day: 12/07 0701 - 12/08 0700 In: 2400.8 [P.O.:1560; I.V.:140.8; IV Piggyback:700] Out: 1050 [Urine:1050]  PHYSICAL EXAM General appearance: alert, cooperative, mild distress and morbidly obese Resp: clear to auscultation bilaterally Cardio: regular rate and rhythm, S1, S2 normal, no murmur, click, rub or gallop GI: She has diffuse mild tenderness but no rebound Extremities: extremities normal, atraumatic, no cyanosis or edema  Lab Results:    Basic Metabolic Panel:  Basename 09/12/11 0541 09/10/11 1850  NA 132* 134*  K 4.1 3.9  CL 96 95*  CO2 29 28  GLUCOSE 98 116*  BUN 18 21  CREATININE 1.53* 0.98  CALCIUM 8.5 9.6  MG -- --  PHOS -- --   Liver Function Tests: No results found for this basename: AST:2,ALT:2,ALKPHOS:2,BILITOT:2,PROT:2,ALBUMIN:2 in the last 72 hours No results found for this basename: LIPASE:2,AMYLASE:2 in the last 72 hours No results found for this basename: AMMONIA:2 in the last 72 hours CBC:  Basename 09/12/11 0541 09/10/11 1850  WBC 6.7 13.7*  NEUTROABS 3.9 10.8*  HGB 9.1* 11.8*  HCT 28.0* 36.1  MCV 94.6 93.0  PLT 206 311   Cardiac Enzymes: No results found for this basename: CKTOTAL:3,CKMB:3,CKMBINDEX:3,TROPONINI:3 in the last 72 hours BNP: No results found for this  basename: POCBNP:3 in the last 72 hours D-Dimer: No results found for this basename: DDIMER:2 in the last 72 hours CBG: No results found for this basename: GLUCAP:6 in the last 72 hours Hemoglobin A1C: No results found for this basename: HGBA1C in the last 72 hours Fasting Lipid Panel: No results found for this basename: CHOL,HDL,LDLCALC,TRIG,CHOLHDL,LDLDIRECT in the last 72 hours Thyroid Function Tests: No results found for this basename: TSH,T4TOTAL,FREET4,T3FREE,THYROIDAB in the last 72 hours Anemia Panel: No results found for this basename: VITAMINB12,FOLATE,FERRITIN,TIBC,IRON,RETICCTPCT in the last 72 hours Coagulation: No results found for this basename: LABPROT:2,INR:2 in the last 72 hours Urine Drug Screen: Drugs of Abuse  No results found for this basename: labopia, cocainscrnur, labbenz, amphetmu, thcu, labbarb    Alcohol Level: No results found for this basename: ETH:2 in the last 72 hours Urinalysis:  Misc. Labs:  ABGS No results found for this basename: PHART,PCO2,PO2ART,TCO2,HCO3 in the last 72 hours CULTURES No results found for this or any previous visit (from the past 240 hour(s)). Studies/Results: Ct Abdomen Pelvis W Contrast  09/10/2011  *RADIOLOGY REPORT*  Clinical Data: Low abdominal pain with nausea and vomiting. History of diverticulitis, complete hysterectomy and cholecystectomy.  CT ABDOMEN AND PELVIS WITH CONTRAST  Technique:  Multidetector CT imaging of the abdomen and pelvis was performed following the standard protocol during bolus administration of intravenous contrast.  Contrast: OMNIPAQUE IOHEXOL 300 MG/ML IV SOLN  Comparison: Abdominal ultrasound 04/16/2011.  Abdominal CT 01/15/2011.  Findings: The lung bases are clear.  There is no pleural effusion. There is no significant  biliary dilatation status post cholecystectomy.  The liver, spleen and pancreas appear normal. The adrenal glands appear normal.  Kidneys are stable with central calcifications  bilaterally, likely renovascular. Small calculi are considered less likely.  There is no hydronephrosis or ureteral calculus.  There is recurrent circumferential wall thickening and surrounding inflammatory change involving the mid descending colon.  This is very similar in appearance and location to the prior study.  There is no surrounding fluid collection or free intraperitoneal air.  A small amount of free pelvic fluid is present.  There is more diffuse sigmoid colon diverticulosis without surrounding inflammation.  There is no pelvic mass status post hysterectomy.  Urinary bladder appears normal.  There is stable chronic degenerative disc disease at L5-S1.  No acute osseous findings are seen.  IMPRESSION:  1.  Recurrent mid descending colon diverticulitis.  The location and extent are very similar to the prior examination performed 8 months ago.  Therefore, an underlying mucosal lesion should be excluded. 2.  No evidence of bowel obstruction or abscess. 3.  Stable probable renovascular calcifications versus tiny calculi.  Original Report Authenticated By: Gerrianne Scale, M.D.    Medications:  Prior to Admission:  Prescriptions prior to admission  Medication Sig Dispense Refill  . ALPRAZolam (XANAX) 1 MG tablet Take 1 mg by mouth 4 (four) times daily as needed. Take up to 4 times daily and at bedtime for sleep/anxiety      . aspirin 325 MG tablet Take 325 mg by mouth daily.       . carbamazepine (TEGRETOL XR) 200 MG 12 hr tablet Take 200 mg by mouth 2 (two) times daily.        Marland Kitchen esomeprazole (NEXIUM) 40 MG capsule Take 40 mg by mouth daily before breakfast.        . FLUoxetine (PROZAC) 20 MG tablet Take 20 mg by mouth every morning.       . furosemide (LASIX) 80 MG tablet Take 80 mg by mouth daily.        Marland Kitchen HYDROcodone-acetaminophen (NORCO) 10-325 MG per tablet Take 1 tablet by mouth every 6 (six) hours as needed. For pain       . lamoTRIgine (LAMICTAL) 100 MG tablet Take 100 mg by mouth 2 (two)  times daily.        Marland Kitchen levothyroxine (SYNTHROID, LEVOTHROID) 75 MCG tablet Take 75 mcg by mouth daily.        Marland Kitchen LOTENSIN 20 MG tablet TAKE (1) TABLET BY MOUTH EACH MORNING.  30 each  12  . metoprolol (TOPROL-XL) 50 MG 24 hr tablet Take 50 mg by mouth every morning.       Marland Kitchen MEVACOR 40 MG tablet TAKE (2) TABLETS BY MOUTHAT BEDTIME.  60 each  11  . Multiple Vitamins-Minerals (MULTIVITAMINS THER. W/MINERALS) TABS Take 1 tablet by mouth 2 (two) times daily.        Marland Kitchen NITROSTAT 0.4 MG SL tablet DISSOLVE 1 TABLET UNDER TONGUE EVERY 5 MINUTES UP TO 15 MIN FOR CHESTPAIN. IF NO RELIEF CALL 911.  25 each  3  . potassium chloride SA (K-DUR,KLOR-CON) 20 MEQ tablet Take 20 mEq by mouth daily.        . prasugrel (EFFIENT) 10 MG TABS Take 10 mg by mouth every morning.        Scheduled:   . aspirin  325 mg Oral Daily  . benazepril  20 mg Oral Daily  . carbamazepine  200 mg Oral BID  . ciprofloxacin  400 mg Intravenous BID  . enoxaparin (LOVENOX) injection  40 mg Subcutaneous Q24H  . feeding supplement  237 mL Oral QID  . FLUoxetine  20 mg Oral Daily  . furosemide  80 mg Oral Daily  . lamoTRIgine  100 mg Oral BID  . levothyroxine  75 mcg Oral QAC breakfast  . metoprolol tartrate  50 mg Oral Daily  . metronidazole  500 mg Intravenous Q8H  . nitroGLYCERIN      . ondansetron  4 mg Intravenous QID  . pantoprazole  40 mg Oral Q1200  . potassium chloride SA  20 mEq Oral Daily  . simvastatin  40 mg Oral QHS  . sodium chloride      . sodium chloride      . sodium chloride      . sodium chloride       Continuous:   . sodium chloride 10 mL/hr at 09/12/11 0018   WUJ:[id number], HYDROmorphone (DILAUDID) injection, nitroGLYCERIN, ondansetron  Assesment: She is admitted with acute diverticulitis. She had an episode of chest discomfort that appears to been an episode of angina pectoris. Her pain is fairly well controlled but not optimum her blood pressure has been fairly low so I may need to modify her blood  pressure medications. Active Problems:  * No active hospital problems. *     Plan: I think I'm going to switch her to a PCA pump and see how that works.    LOS: 2 days   Kylen Schliep L 09/12/2011, 10:10 AM

## 2011-09-12 NOTE — Progress Notes (Addendum)
Subjective: Since I last evaluated the patient her pain is better than it was 2 days ago. 2 BMs today: soft, watery. No vomiting. Tolerating a full liquid diet.  Objective: Vital signs in last 24 hours: Temp:  [97.1 F (36.2 C)-98.5 F (36.9 C)] 97.9 F (36.6 C) (12/08 0925) Pulse Rate:  [53-72] 72  (12/08 0925) Resp:  [16-20] 18  (12/08 0925) BP: (88-126)/(53-68) 96/56 mmHg (12/08 0925) SpO2:  [90 %-98 %] 98 % (12/08 0925) Last BM Date: 09/12/11  Intake/Output from previous day: 12/07 0701 - 12/08 0700 In: 2400.8 [P.O.:1560; I.V.:140.8; IV Piggyback:700] Out: 1050 [Urine:1050] Intake/Output this shift:    General appearance: cooperative, no distress and slowed mentation Resp: clear to auscultation bilaterally Cardio: regular rate and rhythm GI: soft, MOD TO SEVERE tenderness IN THE RUQ AND RLQ W/ MODERATE REBOUND, NO GUARDING ; MILD TTP IN LUQ AND LLQ W/O REBOUND; bowel sounds normal; no masses,  no organomegaly  Lab Results:  Basename 09/12/11 0541 09/10/11 1850  WBC 6.7 13.7*  HGB 9.1* 11.8*  HCT 28.0* 36.1  PLT 206 311   BMET  Basename 09/12/11 0541 09/10/11 1850  NA 132* 134*  K 4.1 3.9  CL 96 95*  CO2 29 28  GLUCOSE 98 116*  BUN 18 21  CREATININE 1.53* 0.98  CALCIUM 8.5 9.6    Studies/Results: Ct Abdomen Pelvis W Contrast  09/10/2011  *RADIOLOGY REPORT*  Clinical Data: Low abdominal pain with nausea and vomiting. History of diverticulitis, complete hysterectomy and cholecystectomy.  CT ABDOMEN AND PELVIS WITH CONTRAST  Technique:  Multidetector CT imaging of the abdomen and pelvis was performed following the standard protocol during bolus administration of intravenous contrast.  Contrast: OMNIPAQUE IOHEXOL 300 MG/ML IV SOLN  Comparison: Abdominal ultrasound 04/16/2011.  Abdominal CT 01/15/2011.  Findings: The lung bases are clear.  There is no pleural effusion. There is no significant biliary dilatation status post cholecystectomy.  The liver, spleen and  pancreas appear normal. The adrenal glands appear normal.  Kidneys are stable with central calcifications bilaterally, likely renovascular. Small calculi are considered less likely.  There is no hydronephrosis or ureteral calculus.  There is recurrent circumferential wall thickening and surrounding inflammatory change involving the mid descending colon.  This is very similar in appearance and location to the prior study.  There is no surrounding fluid collection or free intraperitoneal air.  A small amount of free pelvic fluid is present.  There is more diffuse sigmoid colon diverticulosis without surrounding inflammation.  There is no pelvic mass status post hysterectomy.  Urinary bladder appears normal.  There is stable chronic degenerative disc disease at L5-S1.  No acute osseous findings are seen.  IMPRESSION:  1.  Recurrent mid descending colon diverticulitis.  The location and extent are very similar to the prior examination performed 8 months ago.  Therefore, an underlying mucosal lesion should be excluded. 2.  No evidence of bowel obstruction or abscess. 3.  Stable probable renovascular calcifications versus tiny calculi.  Original Report Authenticated By: Gerrianne Scale, M.D.    Medications: I have reviewed the patient's current medications.  Assessment/Plan: ACUTE UNCOMPLICATED DIVERTICULITIS  PLAN: 1. COMPLETE 10 DAYS ABX 2. ADVANCE DIET 3. CHANGE ANTIEMETICS TO PRN ON 12/9 & D/C ENSURE 12/9. 4. OP TCS 5. ELECTIVE LEFT HEMICOLECTOMY AS OP   LOS: 2 days   Jonette Eva 09/12/2011, 12:37 PM

## 2011-09-13 MED ORDER — SODIUM CHLORIDE 0.9 % IJ SOLN
INTRAMUSCULAR | Status: AC
  Administered 2011-09-13: 10 mL
  Filled 2011-09-13: qty 3

## 2011-09-13 MED ORDER — HYDROMORPHONE 0.3 MG/ML IV SOLN
INTRAVENOUS | Status: AC
  Filled 2011-09-13: qty 25

## 2011-09-13 MED ORDER — BIOTENE DRY MOUTH MT LIQD
15.0000 mL | Freq: Two times a day (BID) | OROMUCOSAL | Status: DC
  Administered 2011-09-13 – 2011-09-16 (×7): 15 mL via OROMUCOSAL

## 2011-09-13 NOTE — Progress Notes (Signed)
Subjective: Since I last evaluated the patient abd pain remains the same. No nausea or vomuiting. Tolerating pos.  Objective: Vital signs in last 24 hours: Temp:  [97.9 F (36.6 C)-98.4 F (36.9 C)] 98.2 F (36.8 C) (12/09 1405) Pulse Rate:  [55-71] 55  (12/09 1405) Resp:  [9-20] 18  (12/09 1405) BP: (84-145)/(50-70) 84/50 mmHg (12/09 1405) SpO2:  [86 %-98 %] 98 % (12/09 1405) Last BM Date: 09/12/11  Intake/Output from previous day: 12/08 0701 - 12/09 0700 In: 1780 [P.O.:1080; IV Piggyback:700] Out: 1200 [Urine:1200] Intake/Output this shift: Total I/O In: -  Out: 600 [Urine:600]  General appearance: alert, cooperative and appears older than stated age GI: soft, MILD TTP IN RUQ AND RLQ, MOD TTP IN LLQ/LUQ, MILD REBOUND NO GUARDING bowel sounds normal;   Lab Results:  Basename 09/12/11 0541 09/10/11 1850  WBC 6.7 13.7*  HGB 9.1* 11.8*  HCT 28.0* 36.1  PLT 206 311   BMET  Basename 09/12/11 0541 09/10/11 1850  NA 132* 134*  K 4.1 3.9  CL 96 95*  CO2 29 28  GLUCOSE 98 116*  BUN 18 21  CREATININE 1.53* 0.98  CALCIUM 8.5 9.6   Medications: I have reviewed the patient's current medications.  Assessment/Plan: ACUTE UNCOMPLICATED DIVERTICULITIS #5  PLAN: 1. ANTICIPATE D/C 12/10 2. CIP/FLAG FOR 10 DAYS 3. OPV W/ DR. Rolanda Campa IN 1 MO. SCHEDULE OP TCS AT THAT TIME. 4. REFERRAL TO GENERAL SURGERY IN 6 WEEKS.  LOS: 3 days   Jonette Eva 09/13/2011, 2:39 PM

## 2011-09-13 NOTE — Progress Notes (Signed)
NAMEMICAELLA, GITTO               ACCOUNT NO.:  [id number]  MEDICAL RECORD NO.:  [id number]  LOCATION:  A305                          FACILITY:  [facility]  PHYSICIAN:  Kingsley Callander. Ouida Sills, MD       DATE OF BIRTH:  10/05/1956  DATE OF PROCEDURE: DATE OF DISCHARGE:                                PROGRESS NOTE   Ms. Portman has been hospitalized and treated for diverticulitis in her descending colon.  She continues to experience significant pain and is a Dilaudid PCA pump.  VITAL SIGNS:  She is afebrile at 98.4 with a pulse of 71, respirations 14, and blood pressure of 115/70. GENERAL:  She appears somewhat sedated and is speaking somewhat thick tongued. LUNGS:  Clear. HEART:  Regular with no murmurs. ABDOMEN:  Tender in the left lower quadrant without palpable mass.  IMPRESSION/PLAN: 1. Diverticulitis.  Continue IV Cipro and Flagyl.  Her white count     dropped from 13.7 on the 6th to 6.7 on the 8th.  Her hemoglobin has     also dropped from 11.8-9.1.  She has not had signs of active     bleeding.  She has been encouraged to ambulate and to attempt to     scale back gradually on use of pain medications if possible. 2. Creatinine rise.  Her creatinine has risen from 0.98-1.53 with a     minimal change in her BUN from 21-18.  She will have a repeat     metabolic profile and CBC tomorrow and IV fluids will be increased.     Kingsley Callander. Ouida Sills, MD     ROF/MEDQ  D:  09/13/2011  T:  09/13/2011  Job:  [id number]

## 2011-09-14 ENCOUNTER — Inpatient Hospital Stay (HOSPITAL_COMMUNITY): Payer: Medicaid Other

## 2011-09-14 DIAGNOSIS — R109 Unspecified abdominal pain: Secondary | ICD-10-CM

## 2011-09-14 DIAGNOSIS — K5732 Diverticulitis of large intestine without perforation or abscess without bleeding: Secondary | ICD-10-CM

## 2011-09-14 LAB — CBC
HCT: 28.2 % — ABNORMAL LOW (ref 36.0–46.0)
Hemoglobin: 9 g/dL — ABNORMAL LOW (ref 12.0–15.0)
MCHC: 31.9 g/dL (ref 30.0–36.0)
RBC: 3 MIL/uL — ABNORMAL LOW (ref 3.87–5.11)

## 2011-09-14 LAB — BASIC METABOLIC PANEL
BUN: 12 mg/dL (ref 6–23)
CO2: 30 mEq/L (ref 19–32)
Glucose, Bld: 105 mg/dL — ABNORMAL HIGH (ref 70–99)
Potassium: 5.1 mEq/L (ref 3.5–5.1)
Sodium: 130 mEq/L — ABNORMAL LOW (ref 135–145)

## 2011-09-14 MED ORDER — METRONIDAZOLE 500 MG PO TABS
500.0000 mg | ORAL_TABLET | Freq: Three times a day (TID) | ORAL | Status: DC
  Administered 2011-09-14 – 2011-09-16 (×7): 500 mg via ORAL
  Filled 2011-09-14 (×7): qty 1

## 2011-09-14 MED ORDER — IOHEXOL 300 MG/ML  SOLN
100.0000 mL | Freq: Once | INTRAMUSCULAR | Status: AC | PRN
  Administered 2011-09-14: 100 mL via INTRAVENOUS

## 2011-09-14 MED ORDER — CIPROFLOXACIN HCL 250 MG PO TABS
500.0000 mg | ORAL_TABLET | Freq: Two times a day (BID) | ORAL | Status: DC
  Administered 2011-09-14 – 2011-09-16 (×5): 500 mg via ORAL
  Filled 2011-09-14 (×5): qty 2

## 2011-09-14 MED ORDER — HYDROMORPHONE 0.3 MG/ML IV SOLN
INTRAVENOUS | Status: AC
  Administered 2011-09-14: 06:00:00
  Filled 2011-09-14: qty 25

## 2011-09-14 MED ORDER — OXYCODONE-ACETAMINOPHEN 5-325 MG PO TABS
1.0000 | ORAL_TABLET | ORAL | Status: DC | PRN
  Administered 2011-09-14 – 2011-09-16 (×6): 1 via ORAL
  Filled 2011-09-14 (×6): qty 1

## 2011-09-14 MED ORDER — HYDROMORPHONE HCL PF 1 MG/ML IJ SOLN
1.0000 mg | INTRAMUSCULAR | Status: DC | PRN
  Administered 2011-09-14 – 2011-09-15 (×5): 1 mg via INTRAVENOUS
  Filled 2011-09-14 (×5): qty 1

## 2011-09-14 NOTE — Progress Notes (Signed)
Subjective: Patient continues to complain of 9/10 abdominal pain in left lower quadrant. She denies any vomiting. She is using Dilaudid PCA pump regularly. She is still on IV antibiotics. She did eat regular dinner last night and tolerated well.  Objective: Vital signs in last 24 hours: Temp:  [97.9 F (36.6 C)-98.4 F (36.9 C)] 98.4 F (36.9 C) (12/10 0516) Pulse Rate:  [55-66] 63  (12/10 0516) Resp:  [9-20] 12  (12/10 0545) BP: (84-145)/(50-79) 124/78 mmHg (12/10 0516) SpO2:  [93 %-99 %] 99 % (12/10 0739) Last BM Date: 09/12/11 General:   Alert,  Well-developed, obese, pleasant and cooperative in NAD. Eyes:  Sclera clear, no icterus.   Conjunctiva pink. Mouth:  No deformity or lesions, oropharynx pink & moist. Heart:  Regular rate and rhythm; no murmurs, clicks, rubs,  or gallops. Abdomen:  Normal bowel sounds.  Soft, mild quadrant tenderness on palpation.  Nondistended. No palpable masses, hepatosplenomegaly or hernias noted. Msk:  Symmetrical without gross deformities. Normal posture. Pulses:  Normal pulses noted. Extremities:  With trace pretibial edema bilaterally. Neurologic:  Alert and  oriented x4;  grossly normal neurologically. Skin:  Intact without significant lesions or rashes.  Intake/Output from previous day: 12/09 0701 - 12/10 0700 In: 1173.3 [P.O.:240; I.V.:533.3; IV Piggyback:400] Out: 1000 [Urine:1000]  Lab Results:  [facility] 09/14/11 0439 09/12/11 0541  WBC 6.4 6.7  HGB 9.0* 9.1*  HCT 28.2* 28.0*  PLT 213 206   BMET  Basename 09/14/11 0439 09/12/11 0541  NA 130* 132*  K 5.1 4.1  CL 96 96  CO2 30 29  GLUCOSE 105* 98  BUN 12 18  CREATININE 1.06 1.53*  CALCIUM 8.6 8.5   Assessment: 1. Acute uncomplicated DIVERTICULITIS OF COLON (episode 5):  Continues to have significant pain requiring IV/PCA pain meds. She will need to be transitioned to by mouth antibiotics and meds such as vomiting has resolved. 2. GERD: Stable  Plan: 1. Hopeful D/C home  soon 2. Change to by mouth Cipro and Flagyl 3. Will need transition to PO pain meds 4. Office visit in one month with Dr. Darrick Penna to set up a colonoscopy 5. Outpatient surgical consult for diverticulitis  LOS: 4 days   [name]  09/14/2011, 8:22 AM

## 2011-09-14 NOTE — Progress Notes (Signed)
Pt c/o persistent abdominal pain but tolerating pos. CT a/p today to evalaute for abscess.

## 2011-09-14 NOTE — Progress Notes (Signed)
Subjective: She looks much better although she is complaining that her pain is still severe. She is using a PCA pump is receiving her antibiotics but has been able to eat. She also said he complains of some upper abdominal lower chest pain that seems to be positioned  Objective: Vital signs in last 24 hours: Temp:  [97.9 F (36.6 C)-98.4 F (36.9 C)] 98.4 F (36.9 C) (12/10 0516) Pulse Rate:  [55-66] 63  (12/10 0516) Resp:  [9-20] 12  (12/10 0545) BP: (84-145)/(50-79) 124/78 mmHg (12/10 0516) SpO2:  [93 %-99 %] 99 % (12/10 0739) Weight change:  Last BM Date: 09/12/11  Intake/Output from previous day: 12/09 0701 - 12/10 0700 In: 1173.3 [P.O.:240; I.V.:533.3; IV Piggyback:400] Out: 1000 [Urine:1000]  PHYSICAL EXAM General appearance: alert, cooperative and mild distress Resp: clear to auscultation bilaterally Cardio: regular rate and rhythm, S1, S2 normal, no murmur, click, rub or gallop GI: Mild tenderness left lower quadrant but no rebound Extremities: extremities normal, atraumatic, no cyanosis or edema  Lab Results:    Basic Metabolic Panel:  Basename 09/14/11 0439 09/12/11 0541  NA 130* 132*  K 5.1 4.1  CL 96 96  CO2 30 29  GLUCOSE 105* 98  BUN 12 18  CREATININE 1.06 1.53*  CALCIUM 8.6 8.5  MG -- --  PHOS -- --   Liver Function Tests: No results found for this basename: AST:2,ALT:2,ALKPHOS:2,BILITOT:2,PROT:2,ALBUMIN:2 in the last 72 hours No results found for this basename: LIPASE:2,AMYLASE:2 in the last 72 hours No results found for this basename: AMMONIA:2 in the last 72 hours CBC:  Basename 09/14/11 0439 09/12/11 0541  WBC 6.4 6.7  NEUTROABS -- 3.9  HGB 9.0* 9.1*  HCT 28.2* 28.0*  MCV 94.0 94.6  PLT 213 206   Cardiac Enzymes: No results found for this basename: CKTOTAL:3,CKMB:3,CKMBINDEX:3,TROPONINI:3 in the last 72 hours BNP: No results found for this basename: POCBNP:3 in the last 72 hours D-Dimer: No results found for this basename: DDIMER:2  in the last 72 hours CBG: No results found for this basename: GLUCAP:6 in the last 72 hours Hemoglobin A1C: No results found for this basename: HGBA1C in the last 72 hours Fasting Lipid Panel: No results found for this basename: CHOL,HDL,LDLCALC,TRIG,CHOLHDL,LDLDIRECT in the last 72 hours Thyroid Function Tests: No results found for this basename: TSH,T4TOTAL,FREET4,T3FREE,THYROIDAB in the last 72 hours Anemia Panel: No results found for this basename: VITAMINB12,FOLATE,FERRITIN,TIBC,IRON,RETICCTPCT in the last 72 hours Coagulation: No results found for this basename: LABPROT:2,INR:2 in the last 72 hours Urine Drug Screen: Drugs of Abuse  No results found for this basename: labopia, cocainscrnur, labbenz, amphetmu, thcu, labbarb    Alcohol Level: No results found for this basename: ETH:2 in the last 72 hours Urinalysis:  Misc. Labs:  ABGS No results found for this basename: PHART,PCO2,PO2ART,TCO2,HCO3 in the last 72 hours CULTURES No results found for this or any previous visit (from the past 240 hour(s)). Studies/Results: No results found.  Medications:  Scheduled:   . antiseptic oral rinse  15 mL Mouth Rinse BID  . benazepril  20 mg Oral Daily  . carbamazepine  200 mg Oral BID  . ciprofloxacin  500 mg Oral BID  . FLUoxetine  20 mg Oral Daily  . HYDROmorphone PCA 0.3 mg/mL   Intravenous Q4H  . HYDROmorphone PCA 0.3 mg/mL      . HYDROmorphone PCA 0.3 mg/mL      . lamoTRIgine  100 mg Oral BID  . levothyroxine  75 mcg Oral QAC breakfast  . metoprolol tartrate  50 mg Oral Daily  . metroNIDAZOLE  500 mg Oral Q8H  . ondansetron  4 mg Intravenous QID  . pantoprazole  40 mg Oral Q1200  . potassium chloride SA  20 mEq Oral Daily  . simvastatin  40 mg Oral QHS  . sodium chloride      . sodium chloride      . DISCONTD: ciprofloxacin  400 mg Intravenous BID  . DISCONTD: feeding supplement  237 mL Oral QID  . DISCONTD: furosemide  80 mg Oral Daily  . DISCONTD: metronidazole   500 mg Intravenous Q8H   Continuous:   . sodium chloride 100 mL/hr at 09/13/11 1726   ZOX:[id number], diphenhydrAMINE, diphenhydrAMINE, naloxone, nitroGLYCERIN, ondansetron, ondansetron (ZOFRAN) IV, sodium chloride  Assesment: She has diverticulitis. She has multiple other medical problems as listed below. She had an episode of chest pain in the hospital but has not had anymore. She's doing well with incentive spirometry and does not seem to be having problems with her COPD. She has not had any seizures in the hospital Principal Problem:  *DIVERTICULITIS OF COLON Active Problems:  ANXIETY  HYPERTENSION  CORONARY ATHEROSCLEROSIS NATIVE CORONARY ARTERY  CAROTID OCCLUSIVE DISEASE  COPD  GERD  SEIZURE DISORDER    Plan: Continue treatments try to get her up and moving around a little bit more    LOS: 4 days   [name] L 09/14/2011, 8:48 AM

## 2011-09-14 NOTE — Progress Notes (Signed)
UR Chart Review Completed  

## 2011-09-15 ENCOUNTER — Inpatient Hospital Stay (HOSPITAL_COMMUNITY): Payer: Medicaid Other

## 2011-09-15 DIAGNOSIS — K5732 Diverticulitis of large intestine without perforation or abscess without bleeding: Secondary | ICD-10-CM

## 2011-09-15 MED ORDER — NITROGLYCERIN 0.4 MG SL SUBL: 0.4000 mg | SUBLINGUAL_TABLET | SUBLINGUAL | Status: DC | PRN

## 2011-09-15 MED ORDER — SIMVASTATIN 20 MG PO TABS: 40.0000 mg | ORAL_TABLET | Freq: Every day | ORAL | Status: DC

## 2011-09-15 MED ORDER — FLUOXETINE HCL 20 MG PO TABS: 20.0000 mg | ORAL_TABLET | ORAL | Status: DC

## 2011-09-15 MED ORDER — FUROSEMIDE 10 MG/ML IJ SOLN
40.0000 mg | Freq: Two times a day (BID) | INTRAMUSCULAR | Status: DC
  Administered 2011-09-15 – 2011-09-16 (×3): 40 mg via INTRAVENOUS
  Filled 2011-09-15 (×3): qty 4

## 2011-09-15 MED ORDER — LAMOTRIGINE 100 MG PO TABS: 100.0000 mg | ORAL_TABLET | Freq: Two times a day (BID) | ORAL | Status: DC

## 2011-09-15 MED ORDER — PRASUGREL HCL 10 MG PO TABS
10.0000 mg | ORAL_TABLET | ORAL | Status: DC
  Administered 2011-09-15 – 2011-09-16 (×2): 10 mg via ORAL
  Filled 2011-09-15 (×2): qty 1

## 2011-09-15 MED ORDER — ALPRAZOLAM 1 MG PO TABS: 1.0000 mg | ORAL_TABLET | Freq: Four times a day (QID) | ORAL | Status: DC | PRN

## 2011-09-15 MED ORDER — LORATADINE 10 MG PO TABS
10.0000 mg | ORAL_TABLET | Freq: Every day | ORAL | Status: DC
  Administered 2011-09-15 – 2011-09-16 (×2): 10 mg via ORAL
  Filled 2011-09-15 (×2): qty 1

## 2011-09-15 MED ORDER — BENAZEPRIL HCL 10 MG PO TABS: 10.0000 mg | ORAL_TABLET | Freq: Every day | ORAL | Status: DC

## 2011-09-15 MED ORDER — LEVOTHYROXINE SODIUM 75 MCG PO TABS: 75.0000 ug | ORAL_TABLET | Freq: Every day | ORAL | Status: DC

## 2011-09-15 MED ORDER — METOPROLOL SUCCINATE ER 50 MG PO TB24
50.0000 mg | ORAL_TABLET | ORAL | Status: DC
  Administered 2011-09-15 – 2011-09-16 (×2): 50 mg via ORAL
  Filled 2011-09-15 (×2): qty 1

## 2011-09-15 MED ORDER — CARBAMAZEPINE ER 200 MG PO TB12: 200.0000 mg | ORAL_TABLET | Freq: Two times a day (BID) | ORAL | Status: DC

## 2011-09-15 NOTE — Progress Notes (Addendum)
Subjective: Patient has 8/10 abdominal pain in left lower quadrant. She denies any vomiting. She has had some nausea. She is tolerating by mouth Cipro and plantar well. She is eating meals.   Objective: Vital signs in last 24 hours: Temp:  [98 F (36.7 C)-98.7 F (37.1 C)] 98 F (36.7 C) (12/11 0543) Pulse Rate:  [57-90] 59  (12/11 0543) Resp:  [18-20] 20  (12/11 0543) BP: (107-147)/(68-75) 126/69 mmHg (12/11 0543) SpO2:  [90 %-99 %] 91 % (12/11 0543) Last BM Date: 09/12/11 General:   Alert,  Well-developed, obese, pleasant and cooperative in NAD. Eyes:  Sclera clear, no icterus.   Conjunctiva pink. Mouth:  No deformity or lesions, oropharynx pink & moist. Heart:  Regular rate and rhythm; no murmurs, clicks, rubs,  or gallops. Abdomen:  Normal bowel sounds.  Soft, mild left lower quadrant tenderness on palpation.  Nondistended. No palpable masses, hepatosplenomegaly or hernias noted. Msk:  Symmetrical without gross deformities. Normal posture. Pulses:  Normal pulses noted. Extremities:  With trace pretibial edema bilaterally. Neurologic:  Alert and  oriented x4;  grossly normal neurologically. Skin:  Intact without significant lesions or rashes.  Intake/Output from previous day: 12/10 0701 - 12/11 0700 In: 4301.7 [P.O.:720; I.V.:3581.7] Out: 1300 [Urine:1300]  Lab Results:  [facility] 09/14/11 0439  WBC 6.4  HGB 9.0*  HCT 28.2*  PLT 213   BMET  Basename 09/14/11 0439  NA 130*  K 5.1  CL 96  CO2 30  GLUCOSE 105*  BUN 12  CREATININE 1.06  CALCIUM 8.6   CT A/P w/ IV/oral contrast->Scattered colonic diverticulosis with mild mid descending colonic  diverticulitis. No evidence of perforation or abscess.  No additional acute intra abdominal or intrapelvic abnormalities.   Assessment: 1. Acute uncomplicated DIVERTICULITIS OF COLON (episode 5):  Stable without perforation or abscess. 2. GERD: Stable  Plan: 1. Hopeful D/C home soon 2. Complete Cipro and Flagyl as  planned 3. Will need transition to PO pain meds 4. Office visit in one month with Dr. Darrick Penna to set up a colonoscopy 5. Outpatient surgical consult for diverticulitis after colonoscopy if diverticulitis resolves with antibiotic treatment.  LOS: 5 days   Lorenza Burton  09/15/2011, 7:51 AM   Patient tells me she wants to go home today. Does not want any more parenteral pain medications. She would like to go home on Percocet-- follow up with Dr. Darrick Penna as planned.  From a GI standpoint, I'm okay with discharge in the next 24 hours.

## 2011-09-15 NOTE — Progress Notes (Signed)
Subjective: She is a bit more confused this morning. She says that she's had cough and congestion. Her chest shows rhonchi on concerned about her confusion and it may be related to changes in her medications from her home meds are reviewed that again and restarted several medications that she takes at home.  Objective: Vital signs in last 24 hours: Temp:  [98 F (36.7 C)-98.7 F (37.1 C)] 98 F (36.7 C) (12/11 0543) Pulse Rate:  [57-90] 59  (12/11 0543) Resp:  [18-20] 20  (12/11 0543) BP: (107-147)/(68-75) 126/69 mmHg (12/11 0543) SpO2:  [90 %-99 %] 91 % (12/11 0543) Weight change:  Last BM Date: 09/12/11  Intake/Output from previous day: 12/10 0701 - 12/11 0700 In: 4301.7 [P.O.:720; I.V.:3581.7] Out: 1300 [Urine:1300]  PHYSICAL EXAM General appearance: alert, moderate distress and Confused Resp: rhonchi bilaterally Cardio: regular rate and rhythm, S1, S2 normal, no murmur, click, rub or gallop GI: soft, non-tender; bowel sounds normal; no masses,  no organomegaly Extremities: extremities normal, atraumatic, no cyanosis or edema  Lab Results:    Basic Metabolic Panel:  Basename 09/14/11 0439  NA 130*  K 5.1  CL 96  CO2 30  GLUCOSE 105*  BUN 12  CREATININE 1.06  CALCIUM 8.6  MG --  PHOS --   Liver Function Tests: No results found for this basename: AST:2,ALT:2,ALKPHOS:2,BILITOT:2,PROT:2,ALBUMIN:2 in the last 72 hours No results found for this basename: LIPASE:2,AMYLASE:2 in the last 72 hours No results found for this basename: AMMONIA:2 in the last 72 hours CBC:  Basename 09/14/11 0439  WBC 6.4  NEUTROABS --  HGB 9.0*  HCT 28.2*  MCV 94.0  PLT 213   Cardiac Enzymes: No results found for this basename: CKTOTAL:3,CKMB:3,CKMBINDEX:3,TROPONINI:3 in the last 72 hours BNP: No components found with this basename: POCBNP:3 D-Dimer: No results found for this basename: DDIMER:2 in the last 72 hours CBG: No results found for this basename: GLUCAP:6 in the last  72 hours Hemoglobin A1C: No results found for this basename: HGBA1C in the last 72 hours Fasting Lipid Panel: No results found for this basename: CHOL,HDL,LDLCALC,TRIG,CHOLHDL,LDLDIRECT in the last 72 hours Thyroid Function Tests: No results found for this basename: TSH,T4TOTAL,FREET4,T3FREE,THYROIDAB in the last 72 hours Anemia Panel: No results found for this basename: VITAMINB12,FOLATE,FERRITIN,TIBC,IRON,RETICCTPCT in the last 72 hours Coagulation: No results found for this basename: LABPROT:2,INR:2 in the last 72 hours Urine Drug Screen: Drugs of Abuse  No results found for this basename: labopia, cocainscrnur, labbenz, amphetmu, thcu, labbarb    Alcohol Level: No results found for this basename: ETH:2 in the last 72 hours Urinalysis:  Misc. Labs:  ABGS No results found for this basename: PHART,PCO2,PO2ART,TCO2,HCO3 in the last 72 hours CULTURES No results found for this or any previous visit (from the past 240 hour(s)). Studies/Results: Ct Abdomen Pelvis W Contrast  09/14/2011  *RADIOLOGY REPORT*  Clinical Data: Left lower quadrant and less right lower quadrant pain, history of descending colonic diverticulitis, past history coronary disease, hypertension, COPD, GERD, seizures, nephrolithiasis  CT ABDOMEN AND PELVIS WITH CONTRAST  Technique:  Multidetector CT imaging of the abdomen and pelvis was performed following the standard protocol during bolus administration of intravenous contrast. Sagittal and coronal MPR images reconstructed from axial data set.  Contrast: OMNIPAQUE IOHEXOL 300 MG/ML IV SOLN Dilute oral contrast.  Comparison: 09/10/2011  Findings: Minimal interseptal thickening/edema at lung bases, new. Increased peribronchial thickening at both lower lobes. Liver, spleen, pancreas, and adrenal glands normal appearance. Calcifications in both kidneys which could be vascular or tiny  calculi. No renal mass or hydronephrosis.  Scattered atherosclerotic calcifications  aorta, iliac, and coronary arteries. Additional calcification noted at wall of right atrium, nonspecific. Again identified scattered colonic diverticulosis with wall thickening and pericolic inflammatory changes at mid descending colon compatible with acute diverticulitis. Questionable mild wall thickening versus underdistension artifact at the sigmoid colon and proximal rectum, colon underdistended these levels.  Uterus and gallbladder surgically absent with nonvisualization of the appendix. Bladder, ureters and ovaries unremarkable. Stomach and small bowel loops normal appearance. No mass, adenopathy, free fluid, or free air. No acute osseous findings.  IMPRESSION: Scattered colonic diverticulosis with mild mid descending colonic diverticulitis. No evidence of perforation or abscess. No additional acute intra abdominal or intrapelvic abnormalities.  Original Report Authenticated By: Lollie Marrow, M.D.    Medications:  Prior to Admission:  Prescriptions prior to admission  Medication Sig Dispense Refill  . ALPRAZolam (XANAX) 1 MG tablet Take 1 mg by mouth 4 (four) times daily as needed. Take up to 4 times daily and at bedtime for sleep/anxiety      . aspirin 325 MG tablet Take 325 mg by mouth daily.       . carbamazepine (TEGRETOL XR) 200 MG 12 hr tablet Take 200 mg by mouth 2 (two) times daily.        Marland Kitchen esomeprazole (NEXIUM) 40 MG capsule Take 40 mg by mouth daily before breakfast.        . FLUoxetine (PROZAC) 20 MG tablet Take 20 mg by mouth every morning.       . furosemide (LASIX) 80 MG tablet Take 80 mg by mouth daily.        Marland Kitchen HYDROcodone-acetaminophen (NORCO) 10-325 MG per tablet Take 1 tablet by mouth every 6 (six) hours as needed. For pain       . lamoTRIgine (LAMICTAL) 100 MG tablet Take 100 mg by mouth 2 (two) times daily.        Marland Kitchen levothyroxine (SYNTHROID, LEVOTHROID) 75 MCG tablet Take 75 mcg by mouth daily.        Marland Kitchen LOTENSIN 20 MG tablet TAKE (1) TABLET BY MOUTH EACH MORNING.  30 each   12  . metoprolol (TOPROL-XL) 50 MG 24 hr tablet Take 50 mg by mouth every morning.       Marland Kitchen MEVACOR 40 MG tablet TAKE (2) TABLETS BY MOUTHAT BEDTIME.  60 each  11  . Multiple Vitamins-Minerals (MULTIVITAMINS THER. W/MINERALS) TABS Take 1 tablet by mouth 2 (two) times daily.        Marland Kitchen NITROSTAT 0.4 MG SL tablet DISSOLVE 1 TABLET UNDER TONGUE EVERY 5 MINUTES UP TO 15 MIN FOR CHESTPAIN. IF NO RELIEF CALL 911.  25 each  3  . potassium chloride SA (K-DUR,KLOR-CON) 20 MEQ tablet Take 20 mEq by mouth daily.        . prasugrel (EFFIENT) 10 MG TABS Take 10 mg by mouth every morning.        Scheduled:   . antiseptic oral rinse  15 mL Mouth Rinse BID  . benazepril  10 mg Oral Daily  . benazepril  20 mg Oral Daily  . carbamazepine  200 mg Oral BID  . carbamazepine  200 mg Oral BID  . ciprofloxacin  500 mg Oral BID  . FLUoxetine  20 mg Oral Daily  . FLUoxetine  20 mg Oral Q0700  . furosemide  40 mg Intravenous Q12H  . lamoTRIgine  100 mg Oral BID  . lamoTRIgine  100 mg Oral BID  .  levothyroxine  75 mcg Oral QAC breakfast  . levothyroxine  75 mcg Oral Daily  . metoprolol tartrate  50 mg Oral Daily  . metoprolol  50 mg Oral Q0700  . metroNIDAZOLE  500 mg Oral Q8H  . ondansetron  4 mg Intravenous QID  . pantoprazole  40 mg Oral Q1200  . potassium chloride SA  20 mEq Oral Daily  . prasugrel  10 mg Oral Q0700  . simvastatin  40 mg Oral QHS  . simvastatin  40 mg Oral q1800  . DISCONTD: HYDROmorphone PCA 0.3 mg/mL   Intravenous Q4H   Continuous:   . sodium chloride 100 mL/hr at 09/14/11 2309   QQV:[id number], ALPRAZolam, diphenhydrAMINE, diphenhydrAMINE, HYDROmorphone (DILAUDID) injection, iohexol, naloxone, nitroGLYCERIN, nitroGLYCERIN, ondansetron, oxyCODONE-acetaminophen, sodium chloride, DISCONTD: ondansetron (ZOFRAN) IV  Assesment: She has diverticulitis. She has multiple other medical problems including some psychiatric issues and I have reviewed all of her psychiatric medications and make  sure that she is on all of them. She may have some bronchitis now. Principal Problem:  *DIVERTICULITIS OF COLON Active Problems:  ANXIETY  HYPERTENSION  CORONARY ATHEROSCLEROSIS NATIVE CORONARY ARTERY  CAROTID OCCLUSIVE DISEASE  COPD  GERD  SEIZURE DISORDER    Plan: Changes in medications as above she's taking oral meds now I would give her some Lasix IV every chest x-ray and her get a BNP    LOS: 5 days   Brenner Visconti L 09/15/2011, 9:21 AM

## 2011-09-16 ENCOUNTER — Encounter: Payer: Self-pay | Admitting: Gastroenterology

## 2011-09-16 ENCOUNTER — Ambulatory Visit: Payer: Medicaid Other | Admitting: Cardiology

## 2011-09-16 LAB — BASIC METABOLIC PANEL
BUN: 7 mg/dL (ref 6–23)
CO2: 34 mEq/L — ABNORMAL HIGH (ref 19–32)
Chloride: 93 mEq/L — ABNORMAL LOW (ref 96–112)
Glucose, Bld: 107 mg/dL — ABNORMAL HIGH (ref 70–99)
Potassium: 3.6 mEq/L (ref 3.5–5.1)
Sodium: 133 mEq/L — ABNORMAL LOW (ref 135–145)

## 2011-09-16 MED ORDER — CIPROFLOXACIN HCL 500 MG PO TABS: 500.0000 mg | ORAL_TABLET | Freq: Two times a day (BID) | ORAL | Status: AC

## 2011-09-16 MED ORDER — METRONIDAZOLE 500 MG PO TABS: 500.0000 mg | ORAL_TABLET | Freq: Three times a day (TID) | ORAL | Status: AC

## 2011-09-16 NOTE — Progress Notes (Signed)
Subjective: Decreased pain, improving. Tolerating diet. Oral pain medication. Last dose IV Dilaudid yesterday evening around 1950. Home today.   Objective: Vital signs in last 24 hours: Temp:  [97.9 F (36.6 C)-98.2 F (36.8 C)] 98.1 F (36.7 C) (12/12 0541) Pulse Rate:  [62-66] 62  (12/12 0541) Resp:  [18-20] 20  (12/12 0541) BP: (90-175)/(57-84) 155/80 mmHg (12/12 0541) SpO2:  [92 %-98 %] 95 % (12/12 0541) Last BM Date: 09/15/11 General:   Alert and oriented, pleasant Head:  Normocephalic and atraumatic. Eyes:  No icterus, sclera clear. Conjuctiva pink.  Heart:  S1, S2 present, no murmurs noted.  Lungs: Clear to auscultation bilaterally, without wheezing, rales, or rhonchi.  Abdomen:  Bowel sounds present, soft, non-tender, non-distended. No HSM or hernias noted. No rebound or guarding. No masses appreciated  Msk:  Symmetrical without gross deformities. Normal posture. Extremities:  Without clubbing or edema. Neurologic:  Alert and  oriented x4;  grossly normal neurologically. Skin:  Warm and dry, intact without significant lesions.  Psych:  Alert and cooperative. Normal mood and affect.  Intake/Output from previous day: 12/11 0701 - 12/12 0700 In: 250 [P.O.:250] Out: -  Intake/Output this shift:    Lab Results:  [facility] 09/14/11 0439  WBC 6.4  HGB 9.0*  HCT 28.2*  PLT 213   BMET  Basename 09/16/11 0449 09/14/11 0439  NA 133* 130*  K 3.6 5.1  CL 93* 96  CO2 34* 30  GLUCOSE 107* 105*  BUN 7 12  CREATININE 0.94 1.06  CALCIUM 8.6 8.6     Studies/Results: Ct Abdomen Pelvis W Contrast  09/14/2011  *RADIOLOGY REPORT*  Clinical Data: Left lower quadrant and less right lower quadrant pain, history of descending colonic diverticulitis, past history coronary disease, hypertension, COPD, GERD, seizures, nephrolithiasis  CT ABDOMEN AND PELVIS WITH CONTRAST  Technique:  Multidetector CT imaging of the abdomen and pelvis was performed following the standard protocol  during bolus administration of intravenous contrast. Sagittal and coronal MPR images reconstructed from axial data set.  Contrast: OMNIPAQUE IOHEXOL 300 MG/ML IV SOLN Dilute oral contrast.  Comparison: 09/10/2011  Findings: Minimal interseptal thickening/edema at lung bases, new. Increased peribronchial thickening at both lower lobes. Liver, spleen, pancreas, and adrenal glands normal appearance. Calcifications in both kidneys which could be vascular or tiny calculi. No renal mass or hydronephrosis.  Scattered atherosclerotic calcifications aorta, iliac, and coronary arteries. Additional calcification noted at wall of right atrium, nonspecific. Again identified scattered colonic diverticulosis with wall thickening and pericolic inflammatory changes at mid descending colon compatible with acute diverticulitis. Questionable mild wall thickening versus underdistension artifact at the sigmoid colon and proximal rectum, colon underdistended these levels.  Uterus and gallbladder surgically absent with nonvisualization of the appendix. Bladder, ureters and ovaries unremarkable. Stomach and small bowel loops normal appearance. No mass, adenopathy, free fluid, or free air. No acute osseous findings.  IMPRESSION: Scattered colonic diverticulosis with mild mid descending colonic diverticulitis. No evidence of perforation or abscess. No additional acute intra abdominal or intrapelvic abnormalities.  Original Report Authenticated By: Lollie Marrow, M.D.   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  The cardiopericardial silhouette is enlarged and stable.  There is diffuse pulmonary vascular congestion.  Bilateral interstitial prominence with Charyl Dancer B lines is consistent with interstitial pulmonary edema.  No focal airspace disease or  definite pleural  effusion.  The trachea is midline.  No acute bony abnormality identified.  IMPRESSION: Cardiomegaly and interstitial pulmonary edema.  Original Report Authenticated By: Britta Mccreedy, M.D.    Assessment: 55 year old female with recurrent diverticulitis, improved, ready for d/c home on po abx and low-residue diet. Spent about 10 minutes with patient discussing food choices, provided handout. Will need to be seen in outpatient follow-up for TCS and further evaluation for possible elective surgery in the future once this is completed.    Plan: Home today on po abx Follow-up in 1 month with Dr. Darrick Penna as outpatient Needs TCS as outpatient Elective referral for surgical consult in future   LOS: 6 days   [name]  09/16/2011, 7:47 AM

## 2011-09-16 NOTE — Progress Notes (Signed)
Pt is aware of OV on 10/28/11 @ 0900 with SF and appt card was mailed

## 2011-09-16 NOTE — Progress Notes (Signed)
09/16/11 1100 Home Health set up per Amy, with case management prior to discharge. Pt left floor in stable condition via w/c accompanied by t blackwell, rn and patient's daughter.

## 2011-09-16 NOTE — Progress Notes (Signed)
Utilization review completed.  

## 2011-09-16 NOTE — Progress Notes (Signed)
CARE MANAGEMENT NOTE 09/16/2011  Patient:  [name], [name]   Account Number:  [id number]  Date Initiated:  09/11/2011  Documentation initiated by:  Rosemary Holms  Subjective/Objective Assessment:   Pt. admitted with abdominal pain and Diverticulitis. Receives care with the [facility] and prior to admission lived home alone.     Action/Plan:   Per patient, depending on her hospital course, she may need assistance at home upon DC   Anticipated DC Date:  09/16/2011   Anticipated DC Plan:  HOME W HOME HEALTH SERVICES      DC Planning Services  CM consult      Choice offered to / List presented to:          [facility] arranged  HH-1 RN  HH-2 PT      [facility] agency  [facility]   Status of service:  Completed, signed off Medicare Important Message given?   (If response is "NO", the following Medicare IM given date fields will be blank) Date Medicare IM given:   Date Additional Medicare IM given:    Discharge Disposition:  HOME W HOME HEALTH SERVICES  Per UR Regulation:    Comments:  09/16/11 900 Avontae Burkhead Leanord Hawking RN BSN CM DC'd with HH RN, PT through [facility] Ordered sent through [facility]. Face to Face ordered.  09/11/11 1030 Naveen Clardy RN BSN CM

## 2011-09-16 NOTE — Progress Notes (Signed)
Subjective: She says she's better and wants to go home. She does look better. She's not requiring a lot of pain medication now. She is less confused  Objective: Vital signs in last 24 hours: Temp:  [97.9 F (36.6 C)-98.2 F (36.8 C)] 98.1 F (36.7 C) (12/12 0541) Pulse Rate:  [62-66] 62  (12/12 0541) Resp:  [18-20] 20  (12/12 0541) BP: (90-175)/(57-84) 155/80 mmHg (12/12 0541) SpO2:  [92 %-98 %] 95 % (12/12 0541) Weight change:  Last BM Date: 09/15/11  Intake/Output from previous day: 12/11 0701 - 12/12 0700 In: 250 [P.O.:250] Out: -   PHYSICAL EXAM General appearance: alert and mild distress Resp: clear to auscultation bilaterally Cardio: regular rate and rhythm, S1, S2 normal, no murmur, click, rub or gallop GI: Minimal tenderness diffusely Extremities: extremities normal, atraumatic, no cyanosis or edema  Lab Results:    Basic Metabolic Panel:  Basename 09/16/11 0449 09/14/11 0439  NA 133* 130*  K 3.6 5.1  CL 93* 96  CO2 34* 30  GLUCOSE 107* 105*  BUN 7 12  CREATININE 0.94 1.06  CALCIUM 8.6 8.6  MG -- --  PHOS -- --   Liver Function Tests: No results found for this basename: AST:2,ALT:2,ALKPHOS:2,BILITOT:2,PROT:2,ALBUMIN:2 in the last 72 hours No results found for this basename: LIPASE:2,AMYLASE:2 in the last 72 hours No results found for this basename: AMMONIA:2 in the last 72 hours CBC:  Basename 09/14/11 0439  WBC 6.4  NEUTROABS --  HGB 9.0*  HCT 28.2*  MCV 94.0  PLT 213   Cardiac Enzymes: No results found for this basename: CKTOTAL:3,CKMB:3,CKMBINDEX:3,TROPONINI:3 in the last 72 hours BNP: No components found with this basename: POCBNP:3 D-Dimer: No results found for this basename: DDIMER:2 in the last 72 hours CBG: No results found for this basename: GLUCAP:6 in the last 72 hours Hemoglobin A1C: No results found for this basename: HGBA1C in the last 72 hours Fasting Lipid Panel: No results found for this basename:  CHOL,HDL,LDLCALC,TRIG,CHOLHDL,LDLDIRECT in the last 72 hours Thyroid Function Tests: No results found for this basename: TSH,T4TOTAL,FREET4,T3FREE,THYROIDAB in the last 72 hours Anemia Panel: No results found for this basename: VITAMINB12,FOLATE,FERRITIN,TIBC,IRON,RETICCTPCT in the last 72 hours Coagulation: No results found for this basename: LABPROT:2,INR:2 in the last 72 hours Urine Drug Screen: Drugs of Abuse  No results found for this basename: labopia, cocainscrnur, labbenz, amphetmu, thcu, labbarb    Alcohol Level: No results found for this basename: ETH:2 in the last 72 hours Urinalysis:  Misc. Labs:  ABGS No results found for this basename: PHART,PCO2,PO2ART,TCO2,HCO3 in the last 72 hours CULTURES No results found for this or any previous visit (from the past 240 hour(s)). Studies/Results: Ct Abdomen Pelvis W Contrast  09/14/2011  *RADIOLOGY REPORT*  Clinical Data: Left lower quadrant and less right lower quadrant pain, history of descending colonic diverticulitis, past history coronary disease, hypertension, COPD, GERD, seizures, nephrolithiasis  CT ABDOMEN AND PELVIS WITH CONTRAST  Technique:  Multidetector CT imaging of the abdomen and pelvis was performed following the standard protocol during bolus administration of intravenous contrast. Sagittal and coronal MPR images reconstructed from axial data set.  Contrast: OMNIPAQUE IOHEXOL 300 MG/ML IV SOLN Dilute oral contrast.  Comparison: 09/10/2011  Findings: Minimal interseptal thickening/edema at lung bases, new. Increased peribronchial thickening at both lower lobes. Liver, spleen, pancreas, and adrenal glands normal appearance. Calcifications in both kidneys which could be vascular or tiny calculi. No renal mass or hydronephrosis.  Scattered atherosclerotic calcifications aorta, iliac, and coronary arteries. Additional calcification noted at wall of  right atrium, nonspecific. Again identified scattered colonic  diverticulosis with wall thickening and pericolic inflammatory changes at mid descending colon compatible with acute diverticulitis. Questionable mild wall thickening versus underdistension artifact at the sigmoid colon and proximal rectum, colon underdistended these levels.  Uterus and gallbladder surgically absent with nonvisualization of the appendix. Bladder, ureters and ovaries unremarkable. Stomach and small bowel loops normal appearance. No mass, adenopathy, free fluid, or free air. No acute osseous findings.  IMPRESSION: Scattered colonic diverticulosis with mild mid descending colonic diverticulitis. No evidence of perforation or abscess. No additional acute intra abdominal or intrapelvic abnormalities.  Original Report Authenticated By: Lollie Marrow, M.D.   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  The cardiopericardial silhouette is enlarged and stable.  There is diffuse pulmonary vascular congestion.  Bilateral interstitial prominence with Charyl Dancer B lines is consistent with interstitial pulmonary edema.  No focal airspace disease or definite pleural effusion.  The trachea is midline.  No acute bony abnormality identified.  IMPRESSION: Cardiomegaly and interstitial pulmonary edema.  Original Report Authenticated By: Britta Mccreedy, M.D.    Medications:  Prior to Admission:  Prescriptions prior to admission  Medication Sig Dispense Refill  . ALPRAZolam (XANAX) 1 MG tablet Take 1 mg by mouth 4 (four) times daily as needed. Take up to 4 times daily and at bedtime for sleep/anxiety      . aspirin 325 MG tablet Take 325 mg by mouth daily.       . carbamazepine (TEGRETOL XR) 200 MG 12 hr tablet Take 200 mg by mouth 2 (two) times daily.        Marland Kitchen esomeprazole (NEXIUM) 40 MG capsule Take 40 mg by mouth daily before breakfast.          . FLUoxetine (PROZAC) 20 MG tablet Take 20 mg by mouth every morning.       . furosemide (LASIX) 80 MG tablet Take 80 mg by mouth daily.        Marland Kitchen HYDROcodone-acetaminophen (NORCO) 10-325 MG per tablet Take 1 tablet by mouth every 6 (six) hours as needed. For pain       . lamoTRIgine (LAMICTAL) 100 MG tablet Take 100 mg by mouth 2 (two) times daily.        Marland Kitchen levothyroxine (SYNTHROID, LEVOTHROID) 75 MCG tablet Take 75 mcg by mouth daily.        Marland Kitchen LOTENSIN 20 MG tablet TAKE (1) TABLET BY MOUTH EACH MORNING.  30 each  12  . metoprolol (TOPROL-XL) 50 MG 24 hr tablet Take 50 mg by mouth every morning.       Marland Kitchen MEVACOR 40 MG tablet TAKE (2) TABLETS BY MOUTHAT BEDTIME.  60 each  11  . Multiple Vitamins-Minerals (MULTIVITAMINS THER. W/MINERALS) TABS Take 1 tablet by mouth 2 (two) times daily.        Marland Kitchen NITROSTAT 0.4 MG SL tablet DISSOLVE 1 TABLET UNDER TONGUE EVERY 5 MINUTES UP TO 15 MIN FOR CHESTPAIN. IF NO RELIEF CALL 911.  25 each  3  . potassium chloride SA (K-DUR,KLOR-CON) 20 MEQ tablet Take 20 mEq by mouth daily.        . prasugrel (EFFIENT) 10 MG TABS Take 10 mg by mouth every morning.        Scheduled:   . antiseptic oral rinse  15 mL Mouth Rinse  BID  . benazepril  20 mg Oral Daily  . carbamazepine  200 mg Oral BID  . ciprofloxacin  500 mg Oral BID  . FLUoxetine  20 mg Oral Daily  . furosemide  40 mg Intravenous Q12H  . lamoTRIgine  100 mg Oral BID  . levothyroxine  75 mcg Oral QAC breakfast  . loratadine  10 mg Oral Daily  . metoprolol  50 mg Oral Q0700  . metroNIDAZOLE  500 mg Oral Q8H  . ondansetron  4 mg Intravenous QID  . pantoprazole  40 mg Oral Q1200  . potassium chloride SA  20 mEq Oral Daily  . prasugrel  10 mg Oral Q0700  . simvastatin  40 mg Oral QHS  . DISCONTD: benazepril  10 mg Oral Daily  . DISCONTD: carbamazepine  200 mg Oral BID  . DISCONTD: FLUoxetine  20 mg Oral Q0700  . DISCONTD: lamoTRIgine  100 mg Oral BID  . DISCONTD: levothyroxine  75 mcg Oral Daily  .  DISCONTD: metoprolol tartrate  50 mg Oral Daily  . DISCONTD: simvastatin  40 mg Oral q1800   Continuous:   . sodium chloride 100 mL/hr at 09/15/11 2319   ZOX:[id number], diphenhydrAMINE, diphenhydrAMINE, HYDROmorphone (DILAUDID) injection, naloxone, nitroGLYCERIN, ondansetron, oxyCODONE-acetaminophen, sodium chloride, DISCONTD: ALPRAZolam, DISCONTD: nitroGLYCERIN  Assesment: She has diverticulitis which is better. She was confused but that is improved with resumption of her home medications. Overall she is better and I think she's ready for discharge Principal Problem:  *DIVERTICULITIS OF COLON Active Problems:  ANXIETY  HYPERTENSION  CORONARY ATHEROSCLEROSIS NATIVE CORONARY ARTERY  CAROTID OCCLUSIVE DISEASE  COPD  GERD  SEIZURE DISORDER    Plan: Discharge home    LOS: 6 days   Calynn Ferrero L 09/16/2011, 8:51 AM

## 2011-09-16 NOTE — Progress Notes (Unsigned)
Please set up 1 mos hospital f/u with Dr. Darrick Penna.

## 2011-09-16 NOTE — Progress Notes (Signed)
09/16/11 1030 Patient being discharged home. IV sites d/c'd per nurse tech, within normal limits. Reviewed discharge instructions with patient, given copy of instructions, med list, carenotes, and f/u appointment information. Verbalized understanding of instructions. Denied discomfort at this time. Home health RN/PT to be set up per case management. Pt in stable condition awaiting transport home with daughter.

## 2011-09-17 NOTE — Discharge Summary (Signed)
Physician Discharge Summary  Patient ID: [name] MRN: [id number] DOB/AGE: [birth year] 55 y.o. Primary Care Physician:[facility], OT Admit date: 09/10/2011 Discharge date: 09/17/2011    Discharge Diagnoses:   Principal Problem:  *DIVERTICULITIS OF COLON Active Problems:  ANXIETY  HYPERTENSION  CORONARY ATHEROSCLEROSIS NATIVE CORONARY ARTERY  CAROTID OCCLUSIVE DISEASE  COPD  GERD  SEIZURE DISORDER   Discharge Medication List as of 09/16/2011 10:21 AM    START taking these medications   Details  ciprofloxacin (CIPRO) 500 MG tablet Take 1 tablet (500 mg total) by mouth 2 (two) times daily., Starting 09/16/2011, Until Sat 09/26/11, Print    metroNIDAZOLE (FLAGYL) 500 MG tablet Take 1 tablet (500 mg total) by mouth every 8 (eight) hours., Starting 09/16/2011, Until Sat 09/26/11, Print      CONTINUE these medications which have NOT CHANGED   Details  ALPRAZolam (XANAX) 1 MG tablet Take 1 mg by mouth 4 (four) times daily as needed. Take up to 4 times daily and at bedtime for sleep/anxiety, Until Discontinued, Historical Med    aspirin 325 MG tablet Take 325 mg by mouth daily. , Until Discontinued, Historical Med    carbamazepine (TEGRETOL XR) 200 MG 12 hr tablet Take 200 mg by mouth 2 (two) times daily.  , Until Discontinued, Historical Med    esomeprazole (NEXIUM) 40 MG capsule Take 40 mg by mouth daily before breakfast.  , Until Discontinued, Historical Med    FLUoxetine (PROZAC) 20 MG tablet Take 20 mg by mouth every morning. , Until Discontinued, Historical Med    furosemide (LASIX) 80 MG tablet Take 80 mg by mouth daily.  , Until Discontinued, Historical Med    HYDROcodone-acetaminophen (NORCO) 10-325 MG per tablet Take 1 tablet by mouth every 6 (six) hours as needed. For pain , Until Discontinued, Historical Med    lamoTRIgine (LAMICTAL) 100 MG tablet Take 100 mg by mouth 2 (two) times daily.  , Until Discontinued, Historical Med      levothyroxine (SYNTHROID, LEVOTHROID) 75 MCG tablet Take 75 mcg by mouth daily.  , Until Discontinued, Historical Med    LOTENSIN 20 MG tablet TAKE (1) TABLET BY MOUTH EACH MORNING., Normal    metoprolol (TOPROL-XL) 50 MG 24 hr tablet Take 50 mg by mouth every morning. , Until Discontinued, Historical Med    MEVACOR 40 MG tablet TAKE (2) TABLETS BY MOUTHAT BEDTIME., Normal    Multiple Vitamins-Minerals (MULTIVITAMINS THER. W/MINERALS) TABS Take 1 tablet by mouth 2 (two) times daily.  , Until Discontinued, Historical Med    NITROSTAT 0.4 MG SL tablet DISSOLVE 1 TABLET UNDER TONGUE EVERY 5 MINUTES UP TO 15 MIN FOR CHESTPAIN. IF NO RELIEF CALL 911., Normal    potassium chloride SA (K-DUR,KLOR-CON) 20 MEQ tablet Take 20 mEq by mouth daily.  , Until Discontinued, Historical Med    prasugrel (EFFIENT) 10 MG TABS Take 10 mg by mouth every morning. , Until Discontinued, Historical Med      STOP taking these medications     zolpidem (AMBIEN) 5 MG tablet         Discharged Condition: Improved    Consults: Gastroenterology  Significant Diagnostic Studies: Ct Abdomen Pelvis W Contrast  09/14/2011  *RADIOLOGY REPORT*  Clinical Data: Left lower quadrant and less right lower quadrant pain, history of descending colonic diverticulitis, past history coronary disease, hypertension, COPD, GERD, seizures, nephrolithiasis  CT ABDOMEN AND PELVIS WITH CONTRAST  Technique:  Multidetector CT imaging of the abdomen and pelvis was performed following  the standard protocol during bolus administration of intravenous contrast. Sagittal and coronal MPR images reconstructed from axial data set.  Contrast: OMNIPAQUE IOHEXOL 300 MG/ML IV SOLN Dilute oral contrast.  Comparison: 09/10/2011  Findings: Minimal interseptal thickening/edema at lung bases, new. Increased peribronchial thickening at both lower lobes. Liver, spleen, pancreas, and adrenal glands normal appearance. Calcifications in both kidneys which  could be vascular or tiny calculi. No renal mass or hydronephrosis.  Scattered atherosclerotic calcifications aorta, iliac, and coronary arteries. Additional calcification noted at wall of right atrium, nonspecific. Again identified scattered colonic diverticulosis with wall thickening and pericolic inflammatory changes at mid descending colon compatible with acute diverticulitis. Questionable mild wall thickening versus underdistension artifact at the sigmoid colon and proximal rectum, colon underdistended these levels.  Uterus and gallbladder surgically absent with nonvisualization of the appendix. Bladder, ureters and ovaries unremarkable. Stomach and small bowel loops normal appearance. No mass, adenopathy, free fluid, or free air. No acute osseous findings.  IMPRESSION: Scattered colonic diverticulosis with mild mid descending colonic diverticulitis. No evidence of perforation or abscess. No additional acute intra abdominal or intrapelvic abnormalities.  Original Report Authenticated By: Lollie Marrow, M.D.   Ct Abdomen Pelvis W Contrast  09/10/2011  *RADIOLOGY REPORT*  Clinical Data: Low abdominal pain with nausea and vomiting. History of diverticulitis, complete hysterectomy and cholecystectomy.  CT ABDOMEN AND PELVIS WITH CONTRAST  Technique:  Multidetector CT imaging of the abdomen and pelvis was performed following the standard protocol during bolus administration of intravenous contrast.  Contrast: OMNIPAQUE IOHEXOL 300 MG/ML IV SOLN  Comparison: Abdominal ultrasound 04/16/2011.  Abdominal CT 01/15/2011.  Findings: The lung bases are clear.  There is no pleural effusion. There is no significant biliary dilatation status post cholecystectomy.  The liver, spleen and pancreas appear normal. The adrenal glands appear normal.  Kidneys are stable with central calcifications bilaterally, likely renovascular. Small calculi are considered less likely.  There is no hydronephrosis or ureteral calculus.   There is recurrent circumferential wall thickening and surrounding inflammatory change involving the mid descending colon.  This is very similar in appearance and location to the prior study.  There is no surrounding fluid collection or free intraperitoneal air.  A small amount of free pelvic fluid is present.  There is more diffuse sigmoid colon diverticulosis without surrounding inflammation.  There is no pelvic mass status post hysterectomy.  Urinary bladder appears normal.  There is stable chronic degenerative disc disease at L5-S1.  No acute osseous findings are seen.  IMPRESSION:  1.  Recurrent mid descending colon diverticulitis.  The location and extent are very similar to the prior examination performed 8 months ago.  Therefore, an underlying mucosal lesion should be excluded. 2.  No evidence of bowel obstruction or abscess. 3.  Stable probable renovascular calcifications versus tiny calculi.  Original Report Authenticated By: Gerrianne Scale, M.D.   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  The cardiopericardial silhouette is enlarged and stable.  There is diffuse pulmonary vascular congestion.  Bilateral interstitial prominence with Charyl Dancer B lines is consistent with interstitial pulmonary edema.  No focal airspace disease or definite pleural effusion.  The trachea is midline.  No acute bony abnormality identified.  IMPRESSION: Cardiomegaly and interstitial pulmonary edema.  Original Report Authenticated By: Britta Mccreedy, M.D.    Lab Results: Basic  Metabolic Panel:  Basename 09/16/11 0449  NA 133*  K 3.6  CL 93*  CO2 34*  GLUCOSE 107*  BUN 7  CREATININE 0.94  CALCIUM 8.6  MG --  PHOS --   Liver Function Tests: No results found for this basename: AST:2,ALT:2,ALKPHOS:2,BILITOT:2,PROT:2,ALBUMIN:2 in the last 72  hours   CBC: No results found for this basename: WBC:2,NEUTROABS:2,HGB:2,HCT:2,MCV:2,PLT:2 in the last 72 hours  No results found for this or any previous visit (from the past 240 hour(s)).   Hospital Course: She was admitted with diverticulitis. She was treated with intravenous antibiotics and had some problems with prolonged pain. She was also some of her psychiatric medications because of her inability to take them and that seemed to cause her some problems with anxiety but that improved after she was started back on her meds. By the time of discharge she was able to eat she was able to ambulate felt better and said she was back to baseline  Discharge Exam: Blood pressure 155/80, pulse 62, temperature 98.1 F (36.7 C), temperature source Oral, resp. rate 20, height 5\' 4"  (1.626 m), weight 107.8 kg (237 lb 10.5 oz), SpO2 95.00%. She had minimal tenderness in her abdomen but bowel sounds are present and active she was awake and alert  Disposition: Home with home health services  Discharge Orders    Future Appointments: Provider: Department: Dept Phone: Center:   10/28/2011 9:00 AM Duncan Dull Rolly Salter, MD [facility] [phone] [facility]     Future Orders Please Complete By Expires   Home Health      Questions: Responses:   To provide the following care/treatments PT    RN   Face-to-face encounter      Comments:   I Jolin Benavides L certify that this patient is under my care and that I, or a nurse practitioner or physician's assistant working with me, had a face-to-face encounter that meets the physician face-to-face encounter requirements with this patient on 09/16/2011.       Questions: Responses:   The encounter with the patient was in whole, or in part, for the following medical condition, which is the primary reason for home health care diverticulitis   I certify that, based on my findings, the following services are medically necessary home health services Nursing    Physical  therapy   My clinical findings support the need for the above services High Risk for rehospitalization   Further, I certify that my clinical findings support that this patient is homebound (i.e. absences from home require considerable and taxing effort and are for medical reasons or religious services or infrequently or of short duration when for other reasons) Ambulates short distances less than 300 feet   To provide the following care/treatments PT    RN   Discharge patient         Follow-up Information    Follow up with [facility].   Contact information:   [address] [place] [address] [phone]       Follow up with Daenerys Buttram L. Call in 1 week.   Contact information:   [address] [place] [address] [phone]          Signed: Tommy Rainwater [phone]  09/17/2011, 7:33 AM

## 2011-10-12 ENCOUNTER — Other Ambulatory Visit (HOSPITAL_COMMUNITY): Payer: Self-pay | Admitting: Pulmonary Disease

## 2011-10-12 ENCOUNTER — Other Ambulatory Visit: Payer: Self-pay | Admitting: Cardiology

## 2011-10-12 ENCOUNTER — Ambulatory Visit (HOSPITAL_COMMUNITY)
Admission: RE | Admit: 2011-10-12 | Discharge: 2011-10-12 | Disposition: A | Discharge status: Home / Self Care | Payer: Medicaid Other | Source: Ambulatory Visit | Attending: Pulmonary Disease | Admitting: Pulmonary Disease

## 2011-10-12 DIAGNOSIS — M25551 Pain in right hip: Secondary | ICD-10-CM

## 2011-10-12 DIAGNOSIS — M25519 Pain in unspecified shoulder: Secondary | ICD-10-CM

## 2011-10-12 DIAGNOSIS — M549 Dorsalgia, unspecified: Secondary | ICD-10-CM

## 2011-10-12 DIAGNOSIS — M545 Low back pain, unspecified: Secondary | ICD-10-CM | POA: Insufficient documentation

## 2011-10-12 DIAGNOSIS — M25552 Pain in left hip: Secondary | ICD-10-CM

## 2011-10-12 DIAGNOSIS — M25569 Pain in unspecified knee: Secondary | ICD-10-CM | POA: Insufficient documentation

## 2011-10-12 DIAGNOSIS — M25559 Pain in unspecified hip: Secondary | ICD-10-CM | POA: Insufficient documentation

## 2011-10-12 NOTE — Telephone Encounter (Signed)
Please call these in to [facility]. Pt no longer qualify through [facility] due to new insurance/tmj

## 2011-10-13 ENCOUNTER — Other Ambulatory Visit: Payer: Self-pay | Admitting: *Deleted

## 2011-10-13 MED ORDER — METOPROLOL SUCCINATE ER 50 MG PO TB24: 50.0000 mg | ORAL_TABLET | ORAL | Status: DC

## 2011-10-13 MED ORDER — PRASUGREL HCL 10 MG PO TABS: 10.0000 mg | ORAL_TABLET | ORAL | Status: DC

## 2011-10-13 MED ORDER — ESOMEPRAZOLE MAGNESIUM 40 MG PO CPDR: 40.0000 mg | DELAYED_RELEASE_CAPSULE | Freq: Every day | ORAL | Status: DC

## 2011-10-14 ENCOUNTER — Other Ambulatory Visit: Payer: Self-pay | Admitting: *Deleted

## 2011-10-14 MED ORDER — OMEPRAZOLE 20 MG PO CPDR: 20.0000 mg | DELAYED_RELEASE_CAPSULE | Freq: Every day | ORAL | Status: DC

## 2011-10-28 ENCOUNTER — Telehealth: Payer: Self-pay | Admitting: Gastroenterology

## 2011-10-28 ENCOUNTER — Ambulatory Visit: Payer: Medicaid Other | Admitting: Gastroenterology

## 2011-10-28 NOTE — Telephone Encounter (Signed)
Pt was a no show

## 2011-10-28 NOTE — Telephone Encounter (Signed)
CONTACT PT TO RSC 

## 2011-11-02 NOTE — Telephone Encounter (Signed)
Pt is aware of OV on 2/6 at 3 with SF

## 2011-11-11 ENCOUNTER — Telehealth: Payer: Self-pay | Admitting: Gastroenterology

## 2011-11-11 ENCOUNTER — Ambulatory Visit: Payer: Medicaid Other | Admitting: Cardiology

## 2011-11-11 ENCOUNTER — Ambulatory Visit: Payer: Medicaid Other | Admitting: Gastroenterology

## 2011-11-11 NOTE — Telephone Encounter (Signed)
Pt was a no show

## 2011-11-11 NOTE — Telephone Encounter (Signed)
DO NOT RSC PT WILL NEED TO CALL FOR AN APPT.

## 2011-11-20 ENCOUNTER — Other Ambulatory Visit: Payer: Self-pay | Admitting: Cardiology

## 2011-11-26 ENCOUNTER — Telehealth: Payer: Self-pay | Admitting: Gastroenterology

## 2011-11-26 NOTE — Telephone Encounter (Signed)
LMOM to call.

## 2011-11-26 NOTE — Telephone Encounter (Signed)
Pt was calling to confirm her OV on 2/27 at 10 with SF and then started telling me about multiple concerns regarding being tender and going from constipation to diarrhea. She said she was doubled over yesterday with pain, but has pain pills. She said if it continues she will call us back to be seen sooner. Please return her call and advise if I should move her OV up.

## 2011-11-27 ENCOUNTER — Encounter: Payer: Self-pay | Admitting: Cardiology

## 2011-11-27 ENCOUNTER — Ambulatory Visit: Payer: Medicaid Other | Admitting: Cardiology

## 2011-11-30 NOTE — Telephone Encounter (Signed)
LMOM to call.

## 2011-12-02 ENCOUNTER — Ambulatory Visit (INDEPENDENT_AMBULATORY_CARE_PROVIDER_SITE_OTHER): Payer: Medicaid Other | Admitting: Gastroenterology

## 2011-12-02 ENCOUNTER — Other Ambulatory Visit: Payer: Self-pay | Admitting: Gastroenterology

## 2011-12-02 ENCOUNTER — Encounter: Payer: Self-pay | Admitting: Gastroenterology

## 2011-12-02 VITALS — BP 154/89 | HR 71 | Temp 97.6°F | Ht 66.0 in | Wt 237.2 lb

## 2011-12-02 DIAGNOSIS — R1032 Left lower quadrant pain: Secondary | ICD-10-CM

## 2011-12-02 MED ORDER — PEG-KCL-NACL-NASULF-NA ASC-C 100 G PO SOLR: 1.0000 | Freq: Once | ORAL | Status: DC

## 2011-12-02 NOTE — Telephone Encounter (Signed)
Pt has OV scheduled today with Dr. Darrick Penna.

## 2011-12-02 NOTE — Patient Instructions (Signed)
CT SCAN TODAY. COLONOSCOPY MAR 19. REFER TO DR. Lovell Sheehan IN 4 WEEKS.

## 2011-12-02 NOTE — Progress Notes (Signed)
Faxed to PCP

## 2011-12-02 NOTE — Progress Notes (Signed)
Subjective:    Patient ID: [name], female    DOB: [birth year], 56 y.o.   MRN: [id number]  PCP: [facility]  HPI AT LEAST 3-4 EPISODE OF LEFT SIDED DIVERTICULITIS REQUIRING HOSPITALIZATION SINCE 2010. LAST EPISODE DEC 2012.  Sx of pain in left SIDE SINCE dec 2012. BMS: Constipated now: hard stool, day before yesterday-diarrhea, nl stool. Appetite: down. No fever or chills. Bad sweats. No nausea or vomiting, travel, Abx, or fresh water. No blood in her stool or black tarry stools. Bruises easily.  Past Medical History  Diagnosis Date  . Coronary artery disease   . Hypertension   . COPD (chronic obstructive pulmonary disease)   . Hyperlipidemia   . Anxiety and depression   . GERD (gastroesophageal reflux disease)   . Seizure disorder   . Chronic low back pain   . Diverticulitis   . Kidney stones   . MI (myocardial infarction) Nov 2011  . Diverticulitis     4 documented episodes by CT; most recent April 2012   Past Medical History  Diagnosis Date  . Coronary artery disease   . Hypertension   . COPD (chronic obstructive pulmonary disease)   . Hyperlipidemia   . Anxiety and depression   . GERD (gastroesophageal reflux disease)   . Seizure disorder   . Chronic low back pain   . Diverticulitis   . Kidney stones   . MI (myocardial infarction) Nov 2011  . Diverticulitis     4 documented episodes by CT; most recent April 2012    Past Surgical History  Procedure Date  . Coronary artery bypass graft 2005    LIMA to AL, SVG to LAD, SVG to Cx, SVG-PDA  . Carotid endarterectomy 2005  . Left knee repair   . Right shoulder and elbow repair   . Abdominal hysterectomy   . Coronary angioplasty with stent placement 2011  . Cardiac catheterization Jan 2012    patent stents in RCA graft; CAD stable    Allergies  Allergen Reactions  . Morphine And Related Itching    Current Outpatient Prescriptions  Medication Sig Dispense Refill  . ALPRAZolam (XANAX) 1 MG tablet Take 1 mg  by mouth 4 (four) times daily as needed. Take up to 4 times daily and at bedtime for sleep/anxiety      . aspirin 325 MG tablet Take 325 mg by mouth daily.       . carbamazepine (TEGRETOL XR) 200 MG 12 hr tablet Take 200 mg by mouth 2 (two) times daily.        Marland Kitchen FLUoxetine (PROZAC) 20 MG tablet Take 20 mg by mouth every morning.       . furosemide (LASIX) 80 MG tablet Take 80 mg by mouth daily.        Marland Kitchen HYDROcodone-acetaminophen (NORCO) 10-325 MG per tablet Take 1 tablet by mouth every 6 (six) hours as needed. For pain       . lamoTRIgine (LAMICTAL) 100 MG tablet Take 100 mg by mouth 2 (two) times daily.        Marland Kitchen levothyroxine (SYNTHROID, LEVOTHROID) 75 MCG tablet Take 75 mcg by mouth daily.      Marland Kitchen LOTENSIN 20 MG tablet TAKE (1) TABLET BY MOUTH EACH MORNING.    . metoprolol (TOPROL-XL) 50 MG 24 hr tablet Take 1 tablet (50 mg total) by mouth every morning.    Marland Kitchen MEVACOR 40 MG tablet TAKE (2) TABLETS BY MOUTHAT BEDTIME.    . Multiple Vitamins-Minerals (MULTIVITAMINS THER.  W/MINERALS) TABS Take 1 tablet by mouth 2 (two) times daily.      Marland Kitchen NITROSTAT 0.4 MG SL tablet DISSOLVE 1 TABLET UNDER TONGUE EVERY 5 MINUTES UP TO 15 MIN FOR CHESTPAIN. IF NO RELIEF CALL 911.    . omeprazole (PRILOSEC) 20 MG capsule Take 1 capsule (20 mg total) by mouth daily.    . potassium chloride SA (K-DUR,KLOR-CON) 20 MEQ tablet Take 20 mEq by mouth daily.      . prasugrel (EFFIENT) 10 MG TABS Take 1 tablet (10 mg total) by mouth every morning.     Family History  Problem Relation Age of Onset  . Heart attack Mother   . Heart disease Mother   . Breast cancer Mother   . Heart attack Father   . Heart disease Father   . Colon cancer Neg Hx     History   Social History  . Marital Status: Legally Separated         Number of Children: 3  .     Occupational History  . disabled    Social History Main Topics  . Smoking status: Former Smoker -- 1.5 packs/day for 15 years    Types: Cigarettes    Quit date:  02/05/2010  . Smokeless tobacco: Never Used  . Alcohol Use: No     used cocaine, marijuana in past. last 7 years ago per pt   . Drug Use: No  . Sexually Active: No   Other Topics Concern       Review of Systems     Objective:   Physical Exam  Vitals reviewed. Constitutional: She is oriented to person, place, and time. She appears well-developed and well-nourished. No distress.  HENT:  Head: Normocephalic and atraumatic.  Mouth/Throat: Oropharynx is clear and moist. No oropharyngeal exudate.  Eyes: No scleral icterus.  Neck: Normal range of motion. Neck supple.  Cardiovascular: Normal rate, regular rhythm and normal heart sounds.   Pulmonary/Chest: Effort normal and breath sounds normal. No respiratory distress.  Abdominal: Soft. Bowel sounds are normal. She exhibits no distension. There is tenderness (MILD TTP IN LLQ). There is no rebound.  Musculoskeletal: Normal range of motion. She exhibits no edema.  Lymphadenopathy:    She has no cervical adenopathy.  Neurological: She is alert and oriented to person, place, and time.       NO FOCAL DEFICITS   Skin: No erythema.  Psychiatric:       ANXIOUS MOOD, NL AFFECT          Assessment & Plan:

## 2011-12-02 NOTE — Progress Notes (Signed)
Reminder in epic to follow up in 6 months °

## 2011-12-02 NOTE — Assessment & Plan Note (Addendum)
DIVERTICULITIS V. IBS-MIXED PATTERN. PT FAILED TO SHOW FOR LAST 2 APPT.  CT ORDERED FOR TODAY. PT ELECTED TO WAIT UNTIL TOMORROW. TCS IN 2-3 WEEKS WITH PROPOFOL DUE TO POLYPHARMACY. FOLLOW UP IN 6 MOS. REFER TO DR. Lovell Sheehan FOR L HEMICOLECTOMY

## 2011-12-03 ENCOUNTER — Encounter: Payer: Self-pay | Admitting: Gastroenterology

## 2011-12-03 ENCOUNTER — Telehealth: Payer: Self-pay | Admitting: Gastroenterology

## 2011-12-03 ENCOUNTER — Encounter (HOSPITAL_COMMUNITY): Payer: Self-pay

## 2011-12-03 ENCOUNTER — Ambulatory Visit (HOSPITAL_COMMUNITY)
Admission: RE | Admit: 2011-12-03 | Discharge: 2011-12-03 | Disposition: A | Discharge status: Home / Self Care | Payer: Medicaid Other | Source: Ambulatory Visit | Attending: Gastroenterology | Admitting: Gastroenterology

## 2011-12-03 DIAGNOSIS — R9389 Abnormal findings on diagnostic imaging of other specified body structures: Secondary | ICD-10-CM | POA: Insufficient documentation

## 2011-12-03 DIAGNOSIS — K5730 Diverticulosis of large intestine without perforation or abscess without bleeding: Secondary | ICD-10-CM | POA: Insufficient documentation

## 2011-12-03 DIAGNOSIS — R1032 Left lower quadrant pain: Secondary | ICD-10-CM | POA: Insufficient documentation

## 2011-12-03 LAB — CREATININE, SERUM: Creat: 1.07 mg/dL (ref 0.50–1.10)

## 2011-12-03 MED ORDER — IOHEXOL 300 MG/ML  SOLN
100.0000 mL | Freq: Once | INTRAMUSCULAR | Status: AC | PRN
  Administered 2011-12-03: 100 mL via INTRAVENOUS

## 2011-12-03 NOTE — Telephone Encounter (Signed)
Received fax from Dr. Lovell Sheehan' office- pt is scheduled for for an appt on 03/28 @ 10:00- I mailed letter to pt

## 2011-12-03 NOTE — Telephone Encounter (Signed)
Referral faxed to Dr Jenkins  

## 2011-12-08 ENCOUNTER — Telehealth: Payer: Self-pay | Admitting: Gastroenterology

## 2011-12-08 NOTE — Telephone Encounter (Signed)
Please call pt. Her CT SHOWS SHE DOES NOT HAVE DIVERTICULITIS. PROCEED WITH TCS.

## 2011-12-08 NOTE — Telephone Encounter (Signed)
Pt aware. Colonoscopy scheduled for the 19th.

## 2011-12-16 ENCOUNTER — Encounter (HOSPITAL_COMMUNITY): Admission: RE | Admit: 2011-12-16 | Payer: Medicaid Other | Source: Ambulatory Visit | Admitting: Gastroenterology

## 2011-12-17 ENCOUNTER — Telehealth: Payer: Self-pay | Admitting: Cardiology

## 2011-12-17 NOTE — Telephone Encounter (Signed)
**Note De-Identified Darron Stuck Obfuscation** Pt. takes ASA 325 mg daily and Effient 10 mg daily. Please advise./LV

## 2011-12-17 NOTE — Telephone Encounter (Signed)
OK to stop Plavix 5 days before, then restart.

## 2011-12-17 NOTE — Telephone Encounter (Signed)
PT IS SCHEDULED TO HAVE COLONOSCOPY AND IS SCARED FOR THEM TO DO THIS BECAUSE SHE IS ON BLOOD THINNER, THEY HAVE NOT MENTIONED HER STOPPING MEDS BEFORE SHE HAS IT DONE SO SHE WOULD LIKE OUR OPINION.

## 2011-12-18 NOTE — Telephone Encounter (Signed)
**Note De-identified [name] Obfuscation** LMOM./LV 

## 2011-12-21 ENCOUNTER — Encounter (HOSPITAL_COMMUNITY): Payer: Self-pay | Admitting: Pharmacy Technician

## 2011-12-21 NOTE — Telephone Encounter (Signed)
**Note De-Identified Gwenn Teodoro Obfuscation** Clarification: Pt. takes Effient 10 mg QD and ASA 325 mg QD. Please advise./LV

## 2011-12-21 NOTE — Interval H&P Note (Signed)
History and Physical Interval Note:  12/21/2011 3:47 PM  [name]  has presented today for surgery MAR 19, with the diagnosis of DIVERTICULITIS  The various methods of treatment have been discussed with the patient and family. After consideration of risks, benefits and other options for treatment, the patient has consented to  Procedure(s) (LRB): COLONOSCOPY WITH PROPOFOL (N/A) as a surgical intervention .  The patients' history has been reviewed, & IS stable for surgery.  I have reviewed the patients' chart and labs.     [employer]

## 2011-12-21 NOTE — H&P (View-Only) (Signed)
Subjective:    Patient ID: [name], female    DOB: 08/18/1956, 55 y.o.   MRN: [id number]  PCP: [facility]  HPI AT LEAST 3-4 EPISODE OF LEFT SIDED DIVERTICULITIS REQUIRING HOSPITALIZATION SINCE 2010. LAST EPISODE DEC 2012.  Sx of pain in left SIDE SINCE dec 2012. BMS: Constipated now: hard stool, day before yesterday-diarrhea, nl stool. Appetite: down. No fever or chills. Bad sweats. No nausea or vomiting, travel, Abx, or fresh water. No blood in her stool or black tarry stools. Bruises easily.  Past Medical History  Diagnosis Date  . Coronary artery disease   . Hypertension   . COPD (chronic obstructive pulmonary disease)   . Hyperlipidemia   . Anxiety and depression   . GERD (gastroesophageal reflux disease)   . Seizure disorder   . Chronic low back pain   . Diverticulitis   . Kidney stones   . MI (myocardial infarction) Nov 2011  . Diverticulitis     4 documented episodes by CT; most recent April 2012   Past Medical History  Diagnosis Date  . Coronary artery disease   . Hypertension   . COPD (chronic obstructive pulmonary disease)   . Hyperlipidemia   . Anxiety and depression   . GERD (gastroesophageal reflux disease)   . Seizure disorder   . Chronic low back pain   . Diverticulitis   . Kidney stones   . MI (myocardial infarction) Nov 2011  . Diverticulitis     4 documented episodes by CT; most recent April 2012    Past Surgical History  Procedure Date  . Coronary artery bypass graft 2005    LIMA to AL, SVG to LAD, SVG to Cx, SVG-PDA  . Carotid endarterectomy 2005  . Left knee repair   . Right shoulder and elbow repair   . Abdominal hysterectomy   . Coronary angioplasty with stent placement 2011  . Cardiac catheterization Jan 2012    patent stents in RCA graft; CAD stable    Allergies  Allergen Reactions  . Morphine And Related Itching    Current Outpatient Prescriptions  Medication Sig Dispense Refill  . ALPRAZolam (XANAX) 1 MG tablet Take 1 mg  by mouth 4 (four) times daily as needed. Take up to 4 times daily and at bedtime for sleep/anxiety      . aspirin 325 MG tablet Take 325 mg by mouth daily.       . carbamazepine (TEGRETOL XR) 200 MG 12 hr tablet Take 200 mg by mouth 2 (two) times daily.        . FLUoxetine (PROZAC) 20 MG tablet Take 20 mg by mouth every morning.       . furosemide (LASIX) 80 MG tablet Take 80 mg by mouth daily.        . HYDROcodone-acetaminophen (NORCO) 10-325 MG per tablet Take 1 tablet by mouth every 6 (six) hours as needed. For pain       . lamoTRIgine (LAMICTAL) 100 MG tablet Take 100 mg by mouth 2 (two) times daily.        . levothyroxine (SYNTHROID, LEVOTHROID) 75 MCG tablet Take 75 mcg by mouth daily.      . LOTENSIN 20 MG tablet TAKE (1) TABLET BY MOUTH EACH MORNING.    . metoprolol (TOPROL-XL) 50 MG 24 hr tablet Take 1 tablet (50 mg total) by mouth every morning.    . MEVACOR 40 MG tablet TAKE (2) TABLETS BY MOUTHAT BEDTIME.    . Multiple Vitamins-Minerals (MULTIVITAMINS THER.   W/MINERALS) TABS Take 1 tablet by mouth 2 (two) times daily.      . NITROSTAT 0.4 MG SL tablet DISSOLVE 1 TABLET UNDER TONGUE EVERY 5 MINUTES UP TO 15 MIN FOR CHESTPAIN. IF NO RELIEF CALL 911.    . omeprazole (PRILOSEC) 20 MG capsule Take 1 capsule (20 mg total) by mouth daily.    . potassium chloride SA (K-DUR,KLOR-CON) 20 MEQ tablet Take 20 mEq by mouth daily.      . prasugrel (EFFIENT) 10 MG TABS Take 1 tablet (10 mg total) by mouth every morning.     Family History  Problem Relation Age of Onset  . Heart attack Mother   . Heart disease Mother   . Breast cancer Mother   . Heart attack Father   . Heart disease Father   . Colon cancer Neg Hx     History   Social History  . Marital Status: Legally Separated         Number of Children: 3  .     Occupational History  . disabled    Social History Main Topics  . Smoking status: Former Smoker -- 1.5 packs/day for 15 years    Types: Cigarettes    Quit date:  02/05/2010  . Smokeless tobacco: Never Used  . Alcohol Use: No     used cocaine, marijuana in past. last 7 years ago per pt   . Drug Use: No  . Sexually Active: No   Other Topics Concern       Review of Systems     Objective:   Physical Exam  Vitals reviewed. Constitutional: She is oriented to person, place, and time. She appears well-developed and well-nourished. No distress.  HENT:  Head: Normocephalic and atraumatic.  Mouth/Throat: Oropharynx is clear and moist. No oropharyngeal exudate.  Eyes: No scleral icterus.  Neck: Normal range of motion. Neck supple.  Cardiovascular: Normal rate, regular rhythm and normal heart sounds.   Pulmonary/Chest: Effort normal and breath sounds normal. No respiratory distress.  Abdominal: Soft. Bowel sounds are normal. She exhibits no distension. There is tenderness (MILD TTP IN LLQ). There is no rebound.  Musculoskeletal: Normal range of motion. She exhibits no edema.  Lymphadenopathy:    She has no cervical adenopathy.  Neurological: She is alert and oriented to person, place, and time.       NO FOCAL DEFICITS   Skin: No erythema.  Psychiatric:       ANXIOUS MOOD, NL AFFECT          Assessment & Plan:   

## 2011-12-22 ENCOUNTER — Ambulatory Visit (HOSPITAL_COMMUNITY)
Admission: RE | Admit: 2011-12-22 | Discharge: 2011-12-22 | Disposition: A | Discharge status: Home / Self Care | Payer: Medicaid Other | Source: Ambulatory Visit | Attending: Gastroenterology | Admitting: Gastroenterology

## 2011-12-22 ENCOUNTER — Encounter (HOSPITAL_COMMUNITY): Payer: Self-pay | Admitting: Anesthesiology

## 2011-12-22 ENCOUNTER — Ambulatory Visit (HOSPITAL_COMMUNITY): Payer: Medicaid Other | Admitting: Anesthesiology

## 2011-12-22 ENCOUNTER — Encounter (HOSPITAL_COMMUNITY): Payer: Self-pay | Admitting: *Deleted

## 2011-12-22 ENCOUNTER — Encounter (HOSPITAL_COMMUNITY)
Admission: RE | Disposition: A | Discharge status: Home / Self Care | Payer: Self-pay | Source: Ambulatory Visit | Attending: Gastroenterology

## 2011-12-22 DIAGNOSIS — J449 Chronic obstructive pulmonary disease, unspecified: Secondary | ICD-10-CM | POA: Insufficient documentation

## 2011-12-22 DIAGNOSIS — Z01812 Encounter for preprocedural laboratory examination: Secondary | ICD-10-CM | POA: Insufficient documentation

## 2011-12-22 DIAGNOSIS — K648 Other hemorrhoids: Secondary | ICD-10-CM

## 2011-12-22 DIAGNOSIS — K5730 Diverticulosis of large intestine without perforation or abscess without bleeding: Secondary | ICD-10-CM

## 2011-12-22 DIAGNOSIS — Z7982 Long term (current) use of aspirin: Secondary | ICD-10-CM | POA: Insufficient documentation

## 2011-12-22 DIAGNOSIS — Z8719 Personal history of other diseases of the digestive system: Secondary | ICD-10-CM

## 2011-12-22 DIAGNOSIS — I10 Essential (primary) hypertension: Secondary | ICD-10-CM | POA: Insufficient documentation

## 2011-12-22 DIAGNOSIS — Z79899 Other long term (current) drug therapy: Secondary | ICD-10-CM | POA: Insufficient documentation

## 2011-12-22 DIAGNOSIS — J4489 Other specified chronic obstructive pulmonary disease: Secondary | ICD-10-CM | POA: Insufficient documentation

## 2011-12-22 HISTORY — DX: Sleep apnea, unspecified: G47.30

## 2011-12-22 LAB — POCT I-STAT 4, (NA,K, GLUC, HGB,HCT)
Glucose, Bld: 107 mg/dL — ABNORMAL HIGH (ref 70–99)
HCT: 34 % — ABNORMAL LOW (ref 36.0–46.0)

## 2011-12-22 SURGERY — COLONOSCOPY WITH PROPOFOL
Anesthesia: Monitor Anesthesia Care

## 2011-12-22 MED ORDER — LACTATED RINGERS IV SOLN
INTRAVENOUS | Status: DC | PRN
  Administered 2011-12-22: 07:00:00 via INTRAVENOUS

## 2011-12-22 MED ORDER — MIDAZOLAM HCL 2 MG/2ML IJ SOLN
INTRAMUSCULAR | Status: AC
  Filled 2011-12-22: qty 2

## 2011-12-22 MED ORDER — MIDAZOLAM HCL 2 MG/2ML IJ SOLN
INTRAMUSCULAR | Status: AC
  Administered 2011-12-22: 2 mg
  Filled 2011-12-22: qty 2

## 2011-12-22 MED ORDER — LACTATED RINGERS IV SOLN
INTRAVENOUS | Status: DC
  Administered 2011-12-22: 1000 mL via INTRAVENOUS

## 2011-12-22 MED ORDER — WATER FOR IRRIGATION, STERILE IR SOLN
Status: DC | PRN
  Administered 2011-12-22: 1000 mL

## 2011-12-22 MED ORDER — FENTANYL CITRATE 0.05 MG/ML IJ SOLN
INTRAMUSCULAR | Status: DC | PRN
  Administered 2011-12-22 (×2): 50 ug via INTRAVENOUS

## 2011-12-22 MED ORDER — ONDANSETRON HCL 4 MG/2ML IJ SOLN
INTRAMUSCULAR | Status: AC
  Filled 2011-12-22: qty 2

## 2011-12-22 MED ORDER — FENTANYL CITRATE 0.05 MG/ML IJ SOLN: 25.0000 ug | INTRAMUSCULAR | Status: DC | PRN

## 2011-12-22 MED ORDER — FENTANYL CITRATE 0.05 MG/ML IJ SOLN
INTRAMUSCULAR | Status: AC
  Filled 2011-12-22: qty 2

## 2011-12-22 MED ORDER — MIDAZOLAM HCL 2 MG/2ML IJ SOLN: 1.0000 mg | INTRAMUSCULAR | Status: DC | PRN

## 2011-12-22 MED ORDER — ONDANSETRON HCL 4 MG/2ML IJ SOLN: 4.0000 mg | Freq: Once | INTRAMUSCULAR | Status: DC | PRN

## 2011-12-22 MED ORDER — GLYCOPYRROLATE 0.2 MG/ML IJ SOLN: 0.2000 mg | Freq: Once | INTRAMUSCULAR | Status: DC

## 2011-12-22 MED ORDER — STERILE WATER FOR IRRIGATION IR SOLN
Status: DC | PRN
  Administered 2011-12-22: 08:00:00

## 2011-12-22 MED ORDER — PROPOFOL 10 MG/ML IV EMUL
INTRAVENOUS | Status: AC
  Filled 2011-12-22: qty 20

## 2011-12-22 MED ORDER — PROPOFOL 10 MG/ML IV EMUL
INTRAVENOUS | Status: DC | PRN
  Administered 2011-12-22: 125 ug/kg/min via INTRAVENOUS

## 2011-12-22 MED ORDER — GLYCOPYRROLATE 0.2 MG/ML IJ SOLN
INTRAMUSCULAR | Status: AC
  Administered 2011-12-22: 0.2 mg
  Filled 2011-12-22: qty 1

## 2011-12-22 MED ORDER — MIDAZOLAM HCL 5 MG/5ML IJ SOLN
INTRAMUSCULAR | Status: DC | PRN
  Administered 2011-12-22: 2 mg via INTRAVENOUS
  Administered 2011-12-22: 1 mg via INTRAVENOUS

## 2011-12-22 MED ORDER — LACTATED RINGERS IV SOLN: INTRAVENOUS | Status: DC

## 2011-12-22 SURGICAL SUPPLY — 21 items
ELECT REM PT RETURN 9FT ADLT (ELECTROSURGICAL)
ELECTRODE REM PT RTRN 9FT ADLT (ELECTROSURGICAL) IMPLANT
FCP BXJMBJMB 240X2.8X (CUTTING FORCEPS)
FLOOR PAD 36X40 (MISCELLANEOUS) ×2
FORCEPS BIOP RAD 4 LRG CAP 4 (CUTTING FORCEPS) IMPLANT
FORCEPS BIOP RJ4 240 W/NDL (CUTTING FORCEPS)
FORCEPS BXJMBJMB 240X2.8X (CUTTING FORCEPS) IMPLANT
INJECTOR/SNARE I SNARE (MISCELLANEOUS) IMPLANT
LUBRICANT JELLY 4.5OZ STERILE (MISCELLANEOUS) ×2 IMPLANT
MANIFOLD NEPTUNE II (INSTRUMENTS) ×2 IMPLANT
NEEDLE SCLEROTHERAPY 25GX240 (NEEDLE) IMPLANT
PAD FLOOR 36X40 (MISCELLANEOUS) ×1 IMPLANT
PROBE APC STR FIRE (PROBE) IMPLANT
PROBE INJECTION GOLD (MISCELLANEOUS)
PROBE INJECTION GOLD 7FR (MISCELLANEOUS) IMPLANT
SNARE SHORT THROW 13M SML OVAL (MISCELLANEOUS) ×2 IMPLANT
SYR 50ML LL SCALE MARK (SYRINGE) ×2 IMPLANT
TRAP SPECIMEN MUCOUS 40CC (MISCELLANEOUS) IMPLANT
TUBING ENDO SMARTCAP PENTAX (MISCELLANEOUS) IMPLANT
TUBING IRRIGATION ENDOGATOR (MISCELLANEOUS) ×2 IMPLANT
WATER STERILE IRR 1000ML POUR (IV SOLUTION) ×4 IMPLANT

## 2011-12-22 NOTE — Discharge Instructions (Signed)
You have small internal hemorrhoids and diverticulosis IN YOUR LEFT & RIGHT COLON.   FOLLOW A HIGH FIBER DIET. AVOID ITEMS THAT CAUSE BLOATING. SEE INFO BELOW. FOLLOW UP WITH DR. Lovell Sheehan. Next colonoscopy in 10 years.  Colonoscopy Care After Read the instructions outlined below and refer to this sheet in the next week. These discharge instructions provide you with general information on caring for yourself after you leave the hospital. While your treatment has been planned according to the most current medical practices available, unavoidable complications occasionally occur. If you have any problems or questions after discharge, call DR. Presli Fanguy, [phone].  ACTIVITY  You may resume your regular activity, but move at a slower pace for the next 24 hours.   Take frequent rest periods for the next 24 hours.   Walking will help get rid of the air and reduce the bloated feeling in your belly (abdomen).   No driving for 24 hours (because of the medicine (anesthesia) used during the test).   You may shower.   Do not sign any important legal documents or operate any machinery for 24 hours (because of the anesthesia used during the test).    NUTRITION  Drink plenty of fluids.   You may resume your normal diet as instructed by your doctor.   Begin with a light meal and progress to your normal diet. Heavy or fried foods are harder to digest and may make you feel sick to your stomach (nauseated).   Avoid alcoholic beverages for 24 hours or as instructed.    MEDICATIONS  You may resume your normal medications.   WHAT YOU CAN EXPECT TODAY  Some feelings of bloating in the abdomen.   Passage of more gas than usual.   Spotting of blood in your stool or on the toilet paper  .  IF YOU HAD POLYPS REMOVED DURING THE COLONOSCOPY:  Eat a soft diet IF YOU HAVE NAUSEA, BLOATING, ABDOMINAL PAIN, OR VOMITING.    FINDING OUT THE RESULTS OF YOUR TEST Not all test results are available  during your visit. DR. Darrick Penna WILL CALL YOU WITHIN 7 DAYS OF YOUR PROCEDUE WITH YOUR RESULTS. Do not assume everything is normal if you have not heard from DR. Kiowa Peifer IN ONE WEEK, CALL HER OFFICE AT [phone].  SEEK IMMEDIATE MEDICAL ATTENTION AND CALL THE OFFICE: [phone] IF:  You have more than a spotting of blood in your stool.   Your belly is swollen (abdominal distention).   You are nauseated or vomiting.   You have a temperature over 101F.   You have abdominal pain or discomfort that is severe or gets worse throughout the day.  High-Fiber Diet A high-fiber diet changes your normal diet to include more whole grains, legumes, fruits, and vegetables. Changes in the diet involve replacing refined carbohydrates with unrefined foods. The calorie level of the diet is essentially unchanged. The Dietary Reference Intake (recommended amount) for adult males is 38 grams per day. For adult females, it is 25 grams per day. Pregnant and lactating women should consume 28 grams of fiber per day. Fiber is the intact part of a plant that is not broken down during digestion. Functional fiber is fiber that has been isolated from the plant to provide a beneficial effect in the body. PURPOSE  Increase stool bulk.   Ease and regulate bowel movements.   Lower cholesterol.  INDICATIONS THAT YOU NEED MORE FIBER  Constipation and hemorrhoids.   Uncomplicated diverticulosis (intestine condition) and irritable bowel syndrome.  Weight management.   As a protective measure against hardening of the arteries (atherosclerosis), diabetes, and cancer.   GUIDELINES FOR INCREASING FIBER IN THE DIET  Start adding fiber to the diet slowly. A gradual increase of about 5 more grams (2 slices of whole-wheat bread, 2 servings of most fruits or vegetables, or 1 bowl of high-fiber cereal) per day is best. Too rapid an increase in fiber may result in constipation, flatulence, and bloating.   Drink enough water  and fluids to keep your urine clear or pale yellow. Water, juice, or caffeine-free drinks are recommended. Not drinking enough fluid may cause constipation.   Eat a variety of high-fiber foods rather than one type of fiber.   Try to increase your intake of fiber through using high-fiber foods rather than fiber pills or supplements that contain small amounts of fiber.   The goal is to change the types of food eaten. Do not supplement your present diet with high-fiber foods, but replace foods in your present diet.  INCLUDE A VARIETY OF FIBER SOURCES  Replace refined and processed grains with whole grains, canned fruits with fresh fruits, and incorporate other fiber sources. White rice, white breads, and most bakery goods contain little or no fiber.   Brown whole-grain rice, buckwheat oats, and many fruits and vegetables are all good sources of fiber. These include: broccoli, Brussels sprouts, cabbage, cauliflower, beets, sweet potatoes, white potatoes (skin on), carrots, tomatoes, eggplant, squash, berries, fresh fruits, and dried fruits.   Cereals appear to be the richest source of fiber. Cereal fiber is found in whole grains and bran. Bran is the fiber-rich outer coat of cereal grain, which is largely removed in refining. In whole-grain cereals, the bran remains. In breakfast cereals, the largest amount of fiber is found in those with "bran" in their names. The fiber content is sometimes indicated on the label.   You may need to include additional fruits and vegetables each day.   In baking, for 1 cup white flour, you may use the following substitutions:   1 cup whole-wheat flour minus 2 tablespoons.   1/2 cup white flour plus 1/2 cup whole-wheat flour.   Diverticulosis Diverticulosis is a common condition that develops when small pouches (diverticula) form in the wall of the colon. The risk of diverticulosis increases with age. It happens more often in people who eat a low-fiber diet. Most  individuals with diverticulosis have no symptoms. Those individuals with symptoms usually experience belly (abdominal) pain, constipation, or loose stools (diarrhea).  HOME CARE INSTRUCTIONS  Increase the amount of fiber in your diet as directed by your caregiver or dietician. This may reduce symptoms of diverticulosis.   Drink at least 6 to 8 glasses of water each day to prevent constipation.   Try not to strain when you have a bowel movement.   Avoiding nuts and seeds to prevent complications is still an uncertain benefit.       FOODS HAVING HIGH FIBER CONTENT INCLUDE:  Fruits. Apple, peach, pear, tangerine, raisins, prunes.   Vegetables. Brussels sprouts, asparagus, broccoli, cabbage, carrot, cauliflower, romaine lettuce, spinach, summer squash, tomato, winter squash, zucchini.   Starchy Vegetables. Baked beans, kidney beans, lima beans, split peas, lentils, potatoes (with skin).   Grains. Whole wheat bread, brown rice, bran flake cereal, plain oatmeal, white rice, shredded wheat, bran muffins.    SEEK IMMEDIATE MEDICAL CARE IF:  You develop increasing pain or severe bloating.   You have an oral temperature above 101F.  You develop vomiting or bowel movements that are bloody or black.   Hemorrhoids Hemorrhoids are dilated (enlarged) veins around the rectum. Sometimes clots will form in the veins. This makes them swollen and painful. These are called thrombosed hemorrhoids. Causes of hemorrhoids include:  Constipation.   Straining to have a bowel movement.   HEAVY LIFTING HOME CARE INSTRUCTIONS  Eat a well balanced diet and drink 6 to 8 glasses of water every day to avoid constipation. You may also use a bulk laxative.   Avoid straining to have bowel movements.   Keep anal area dry and clean.   Do not use a donut shaped pillow or sit on the toilet for long periods. This increases blood pooling and pain.   Move your bowels when your body has the urge; this will  require less straining and will decrease pain and pressure.

## 2011-12-22 NOTE — Op Note (Signed)
[facility] [address] [place], [place]  [address]  COLONOSCOPY PROCEDURE REPORT  PATIENT:  [name], [name]  MR#:  [id number] BIRTHDATE:  1955-12-18, 55 yrs. old  GENDER:  female  ENDOSCOPIST:  Jonette Eva, MD REF. BY:  Kari Baars, M.D. ASSISTANT:  PROCEDURE DATE:  12/22/2011 PROCEDURE:  Colonoscopy 62130  INDICATIONS:  recurrent diverticulitis  MEDICATIONS:   MAC sedation, administered by CRNA  DESCRIPTION OF PROCEDURE:    Physical exam was performed. Informed consent was obtained from the patient after explaining the benefits, risks, and alternatives to procedure.  The patient was connected to monitor and placed in left lateral position. Continuous oxygen was provided by nasal cannula and IV medicine administered through an indwelling cannula.  After administration of sedation and rectal exam, the patient's rectum was intubated and the  colonoscope was advanced under direct visualization to the cecum.  The scope was removed slowly by carefully examining the color, texture, anatomy, and integrity mucosa on the way out. The patient was recovered in endoscopy and discharged home in satisfactory condition. <<PROCEDUREIMAGES>>  FINDINGS:  Scattered diverticula were found throughout the colon, LEFT > RIGHT.  NO POLYPS OR AVMs SMALL Internal Hemorrhoids were found.  PREP QUALITY: EXCELLENT CECAL W/D TIME:    15 minutes  COMPLICATIONS:    None  ENDOSCOPIC IMPRESSION: 1) Diverticula, scattered throughout the colon 2) Internal hemorrhoids  RECOMMENDATIONS: TCS IN 10 YEARS HIGH FIBER DIET OPV IN 6 MOS. FOLLOW UP WITH DR. Lovell Sheehan.  REPEAT EXAM:  No  ______________________________ Jonette Eva, MD  CC:  Kari Baars, M.D.  n. eSIGNEDDuncan Dull [facility]yside at 12/22/2011 09:29 AM  Vito Backers, [phone]

## 2011-12-22 NOTE — Anesthesia Preprocedure Evaluation (Signed)
Anesthesia Evaluation  Patient identified by MRN, date of birth, ID band Patient awake    Reviewed: Allergy & Precautions, H&P , NPO status , Patient's Chart, lab work & pertinent test results, reviewed documented beta blocker date and time   Airway Mallampati: III  Neck ROM: Full    Dental  (+) Teeth Intact   Pulmonary sleep apnea , COPD COPD inhaler,  breath sounds clear to auscultation        Cardiovascular hypertension, Pt. on medications + angina with exertion + CAD, + Past MI and + Cardiac Stents Rhythm:Regular Rate:Normal     Neuro/Psych  Headaches, Anxiety    GI/Hepatic GERD-  ,  Endo/Other    Renal/GU      Musculoskeletal   Abdominal   Peds  Hematology   Anesthesia Other Findings   Reproductive/Obstetrics                           Anesthesia Physical Anesthesia Plan  ASA: III  Anesthesia Plan: MAC   Post-op Pain Management:    Induction: Intravenous  Airway Management Planned: Simple Face Mask  Additional Equipment:   Intra-op Plan:   Post-operative Plan:   Informed Consent: I have reviewed the patients History and Physical, chart, labs and discussed the procedure including the risks, benefits and alternatives for the proposed anesthesia with the patient or authorized representative who has indicated his/her understanding and acceptance.     Plan Discussed with:   Anesthesia Plan Comments:         Anesthesia Quick Evaluation

## 2011-12-22 NOTE — Transfer of Care (Signed)
Immediate Anesthesia Transfer of Care Note  Patient: [name]  Procedure(s) Performed: Procedure(s) (LRB): COLONOSCOPY WITH PROPOFOL (N/A)  Patient Location: PACU  Anesthesia Type: MAC  Level of Consciousness: awake, alert , oriented and patient cooperative  Airway & Oxygen Therapy: Patient connected to face mask oxygen  Post-op Assessment: Report given to PACU RN and Post -op Vital signs reviewed and stable  Post vital signs: Reviewed and stable  Complications: No apparent anesthesia complications

## 2011-12-22 NOTE — Anesthesia Postprocedure Evaluation (Signed)
  Anesthesia Post-op Note  Patient: [name]  Procedure(s) Performed: Procedure(s) (LRB): COLONOSCOPY WITH PROPOFOL (N/A)  Patient Location: PACU  Anesthesia Type: MAC  Level of Consciousness: awake, alert , oriented and patient cooperative  Airway and Oxygen Therapy: Patient Spontanous Breathing and Patient connected to nasal cannula oxygen  Post-op Pain: none  Post-op Assessment: Post-op Vital signs reviewed, Patient's Cardiovascular Status Stable, Respiratory Function Stable, Patent Airway and No signs of Nausea or vomiting  Post-op Vital Signs: Reviewed and stable  Complications: No apparent anesthesia complications

## 2011-12-22 NOTE — Interval H&P Note (Signed)
History and Physical Interval Note:  12/22/2011 7:41 AM  [name]  has presented today for surgery, with the diagnosis of DIVERTICULITIS  The various methods of treatment have been discussed with the patient and family. After consideration of risks, benefits and other options for treatment, the patient has consented to  Procedure(s) (LRB): COLONOSCOPY WITH PROPOFOL (N/A) as a surgical intervention .  The patients' history has been reviewed, patient examined, no change in status, stable for surgery.  I have reviewed the patients' chart and labs.  Questions were answered to the patient's satisfaction.     [employer]

## 2011-12-23 NOTE — Telephone Encounter (Signed)
OK to stop Effient and restart ASAP>

## 2011-12-24 NOTE — Telephone Encounter (Signed)
**Note De-Identified Vanilla Heatherington Obfuscation** Have called pt. daily since she called office without an answer, will continue to call./LV

## 2011-12-25 ENCOUNTER — Ambulatory Visit: Payer: Medicaid Other | Admitting: Cardiology

## 2011-12-25 NOTE — Telephone Encounter (Signed)
**Note De-Identified Sarenity Ramaker Obfuscation** Pt. had Colonoscopy on 3-19./LV

## 2012-01-11 ENCOUNTER — Ambulatory Visit: Payer: Medicaid Other | Admitting: Cardiology

## 2012-02-03 ENCOUNTER — Telehealth: Payer: Self-pay | Admitting: Cardiology

## 2012-02-03 ENCOUNTER — Other Ambulatory Visit: Payer: Self-pay

## 2012-02-03 MED ORDER — PRASUGREL HCL 10 MG PO TABS: 10.0000 mg | ORAL_TABLET | ORAL | Status: DC

## 2012-02-03 NOTE — Telephone Encounter (Signed)
PT WAS TOLD BY [facility] THAT THEY HAVE BEEN FAXING/CALLING TO GET PT EFFENT REFILL FOR A MONTH. PT KEPT TRYING TO GO THROUGH [facility]. SHE IS NOW CALLING us TO LET us KNOW SHE HAS BEEN OFF MEDICATION FOR A MONTH.  PT WAS ADVISED THAT SHE IS TO CALL us IF ANY OF HER RX HAVE NOT BEEN FILLED IN 48 HOURS.  PT STATES THAT SHE WOULD LIKE TO STAY OFF MEDICATION IF POSSIBLE.  PLEASE LEAVE A MESSAGE IF NO ANSWER. SHE STATES SHE WILL BE AWAY FROM HOME TODAY.  PT HAS APPT WITH DR WALL IN A COUPLE WEEKS

## 2012-02-03 NOTE — Telephone Encounter (Signed)
**Note De-Identified Abimbola Aki Obfuscation** Pt. Is advised that she has been approved for Effient for 1 year and to call this office if she is having trouble getting her medications within 48 hours, she verbalized understanding./LV

## 2012-02-03 NOTE — Telephone Encounter (Signed)
**Note De-Identified Merrik Puebla Obfuscation** [name], pharmacist at [facility], states that a prior [place]. is needed for Effient and he did not know if this office had ever been advised of this. Prior Auth. has been approved for 1 year. Left message for pt. to call office./LV

## 2012-02-04 ENCOUNTER — Other Ambulatory Visit: Payer: Self-pay | Admitting: *Deleted

## 2012-02-04 NOTE — Telephone Encounter (Signed)
Received a phone call from [facility] needing pre-authorization on Effient.  Medication has been authorized for 1 year from today.  [phone].  Pharmacy notified.

## 2012-02-15 ENCOUNTER — Encounter: Payer: Self-pay | Admitting: Cardiology

## 2012-02-15 ENCOUNTER — Ambulatory Visit (INDEPENDENT_AMBULATORY_CARE_PROVIDER_SITE_OTHER): Payer: Medicaid Other | Admitting: Cardiology

## 2012-02-15 ENCOUNTER — Other Ambulatory Visit: Payer: Self-pay | Admitting: Cardiology

## 2012-02-15 VITALS — BP 151/78 | HR 61 | Resp 16 | Ht 64.0 in | Wt 246.0 lb

## 2012-02-15 DIAGNOSIS — I10 Essential (primary) hypertension: Secondary | ICD-10-CM

## 2012-02-15 DIAGNOSIS — I6529 Occlusion and stenosis of unspecified carotid artery: Secondary | ICD-10-CM

## 2012-02-15 DIAGNOSIS — I2510 Atherosclerotic heart disease of native coronary artery without angina pectoris: Secondary | ICD-10-CM

## 2012-02-15 NOTE — Patient Instructions (Addendum)
Your physician recommends that you continue on your current medications as directed. Please refer to the Current Medication list given to you today.  Your physician wants you to follow-up in: 1 year. You will receive a reminder letter in the mail two months in advance. If you don't receive a letter, please call our office to schedule the follow-up appointment.  Your physician has requested that you have a carotid duplex. This test is an ultrasound of the carotid arteries in your neck. It looks at blood flow through these arteries that supply the brain with blood. Allow one hour for this exam. There are no restrictions or special instructions.

## 2012-02-15 NOTE — Assessment & Plan Note (Addendum)
Asymptomatic. Continue aggressive secondary prevention. We'll arrange for carotid Doppler since she is overdue.

## 2012-02-15 NOTE — Assessment & Plan Note (Signed)
Improved. No change in secondary preventive strategy. See [place] back in one year.

## 2012-02-15 NOTE — Assessment & Plan Note (Signed)
This is probably secondary to having a cold not feeling well. Issues under good control. Encouraged to take her meds and watch salt.

## 2012-02-16 ENCOUNTER — Other Ambulatory Visit: Payer: Self-pay | Admitting: Cardiology

## 2012-02-16 DIAGNOSIS — I771 Stricture of artery: Secondary | ICD-10-CM

## 2012-02-17 ENCOUNTER — Other Ambulatory Visit (HOSPITAL_COMMUNITY): Payer: Self-pay | Admitting: Pulmonary Disease

## 2012-02-17 ENCOUNTER — Telehealth: Payer: Self-pay | Admitting: *Deleted

## 2012-02-17 ENCOUNTER — Encounter: Payer: Self-pay | Admitting: Cardiology

## 2012-02-17 DIAGNOSIS — Z139 Encounter for screening, unspecified: Secondary | ICD-10-CM

## 2012-02-17 NOTE — Telephone Encounter (Signed)
Refill for Metoprolol Succinate called in to [facility] pharmacy. Mylo Red RN

## 2012-02-17 NOTE — Progress Notes (Signed)
HPI [name] comes in today for evaluation and management of her coronary artery disease and other comorbidities. She has lost a significant amount of weight and looks really good. She was substance free.  She's not having any angina or ischemic symptoms. She denies palpitations, orthopnea, PND or edema. She seems to be compliant with her medications.  Past Medical History  Diagnosis Date  . Coronary artery disease   . Hypertension   . COPD (chronic obstructive pulmonary disease)   . Hyperlipidemia   . Anxiety and depression   . GERD (gastroesophageal reflux disease)   . Seizure disorder   . Chronic low back pain   . Diverticulitis   . Kidney stones   . Diverticulitis     4 documented episodes by CT; most recent April 2012  . MI (myocardial infarction) Nov 2011    also 2005  . Sleep apnea     has been diagnosed but didn't follow up to get CPAP    Current Outpatient Prescriptions  Medication Sig Dispense Refill  . ALPRAZolam (XANAX) 1 MG tablet Take 1 mg by mouth 4 (four) times daily as needed. Take up to 4 times daily and at bedtime for sleep/anxiety      . aspirin 325 MG tablet Take 325 mg by mouth daily.       Marland Kitchen FLUoxetine (PROZAC) 20 MG tablet Take 20 mg by mouth every morning.       Marland Kitchen HYDROcodone-acetaminophen (NORCO) 10-325 MG per tablet Take 1 tablet by mouth every 6 (six) hours as needed. For pain       . ibuprofen (ADVIL,MOTRIN) 200 MG tablet Take 200 mg by mouth every 6 (six) hours as needed. For headaches      . levothyroxine (SYNTHROID, LEVOTHROID) 75 MCG tablet Take 75 mcg by mouth daily.        Marland Kitchen LOTENSIN 20 MG tablet TAKE (1) TABLET BY MOUTH EACH MORNING.  30 each  12  . MEVACOR 40 MG tablet TAKE (2) TABLETS BY MOUTHAT BEDTIME.  60 each  11  . Multiple Vitamin (MULITIVITAMIN WITH MINERALS) TABS Take 1 tablet by mouth 2 (two) times daily.      Marland Kitchen NITROSTAT 0.4 MG SL tablet DISSOLVE 1 TABLET UNDER TONGUE EVERY 5 MINUTES UP TO 15 MIN FOR CHESTPAIN. IF NO RELIEF CALL  911.  25 each  3  . omeprazole (PRILOSEC) 20 MG capsule Take 1 capsule (20 mg total) by mouth daily.  90 capsule  3  . prasugrel (EFFIENT) 10 MG TABS Take 1 tablet (10 mg total) by mouth every morning.  30 tablet  3  . K-DUR 20 MEQ tablet TAKE ONE TABLET DAILY.  30 each  7  . LASIX 80 MG tablet TAKE ONE TABLET DAILY.  30 each  7  . TOPROL XL 50 MG 24 hr tablet TAKE (1) TABLET BY MOUTH EACH MORNING.  30 each  7  . DISCONTD: carbamazepine (TEGRETOL XR) 200 MG 12 hr tablet Take 200 mg by mouth 2 (two) times daily.        Marland Kitchen DISCONTD: lamoTRIgine (LAMICTAL) 100 MG tablet Take 100 mg by mouth 2 (two) times daily.          Allergies  Allergen Reactions  . Morphine And Related Itching    Family History  Problem Relation Age of Onset  . Heart attack Mother   . Heart disease Mother   . Breast cancer Mother   . Heart attack Father   . Heart disease  Father   . Colon cancer Neg Hx     History   Social History  . Marital Status: Legally Separated    Spouse Name: N/A    Number of Children: 3  . Years of Education: N/A   Occupational History  . disabled    Social History Main Topics  . Smoking status: Former Smoker -- 1.5 packs/day for 15 years    Types: Cigarettes    Quit date: 02/05/2010  . Smokeless tobacco: Never Used  . Alcohol Use: No     used cocaine, marijuana in past. last 7 years ago per pt   . Drug Use: No  . Sexually Active: No   Other Topics Concern  . Not on file   Social History Narrative  . No narrative on file    ROS ALL NEGATIVE EXCEPT THOSE NOTED IN HPI  PE  General Appearance: well developed, well nourished in no acute distress, obese HEENT: symmetrical face, PERRLA, good dentition  Neck: no JVD, thyromegaly, or adenopathy, trachea midline Chest: symmetric without deformity Cardiac: PMI non-displaced, RRR, normal S1, S2, no gallop or murmur Lung: clear to ausculation and percussion Vascular: all pulses full without bruits  Abdominal: nondistended,  nontender, good bowel sounds, no HSM, no bruits Extremities: no cyanosis, clubbing or edema, no sign of DVT, no varicosities  Skin: normal color, no rashes Neuro: alert and oriented x 3, non-focal Pysch: normal affect  EKG  BMET    Component Value Date/Time   NA 138 12/22/2011 0640   K 4.0 12/22/2011 0640   CL 93* 09/16/2011 0449   CO2 34* 09/16/2011 0449   GLUCOSE 107* 12/22/2011 0640   BUN 7 09/16/2011 0449   CREATININE 1.07 12/02/2011 1140   CREATININE 0.94 09/16/2011 0449   CALCIUM 8.6 09/16/2011 0449   GFRNONAA 67* 09/16/2011 0449   GFRAA 78* 09/16/2011 0449    Lipid Panel     Component Value Date/Time   CHOL  Value: 169        ATP III CLASSIFICATION:  <200     mg/dL   Desirable  161-096  mg/dL   Borderline High  >=045    mg/dL   High        01/11/8118 0320   TRIG 358* 10/31/2010 0320   HDL 42 10/31/2010 0320   CHOLHDL 4.0 10/31/2010 0320   VLDL 72* 10/31/2010 0320   LDLCALC  Value: 55        Total Cholesterol/HDL:CHD Risk Coronary Heart Disease Risk Table                     Men   Women  1/2 Average Risk   3.4   3.3  Average Risk       5.0   4.4  2 X Average Risk   9.6   7.1  3 X Average Risk  23.4   11.0        Use the calculated Patient Ratio above and the CHD Risk Table to determine the patient's CHD Risk.        ATP III CLASSIFICATION (LDL):  <100     mg/dL   Optimal  147-829  mg/dL   Near or Above                    Optimal  130-159  mg/dL   Borderline  562-130  mg/dL   High  >865     mg/dL   Very High 7/84/6962 9528  CBC    Component Value Date/Time   WBC 6.4 09/14/2011 0439   RBC 3.00* 09/14/2011 0439   HGB 11.6* 12/22/2011 0640   HCT 34.0* 12/22/2011 0640   PLT 213 09/14/2011 0439   MCV 94.0 09/14/2011 0439   MCH 30.0 09/14/2011 0439   MCHC 31.9 09/14/2011 0439   RDW 12.5 09/14/2011 0439   LYMPHSABS 2.0 09/12/2011 0541   MONOABS 0.7 09/12/2011 0541   EOSABS 0.1 09/12/2011 0541   BASOSABS 0.0 09/12/2011 0541

## 2012-02-18 ENCOUNTER — Ambulatory Visit (HOSPITAL_COMMUNITY): Payer: Medicaid Other

## 2012-02-22 ENCOUNTER — Ambulatory Visit (HOSPITAL_COMMUNITY): Payer: Medicaid Other

## 2012-02-24 ENCOUNTER — Ambulatory Visit (HOSPITAL_COMMUNITY): Payer: Medicaid Other

## 2012-03-04 ENCOUNTER — Ambulatory Visit (HOSPITAL_COMMUNITY)
Admission: RE | Admit: 2012-03-04 | Discharge: 2012-03-04 | Disposition: A | Discharge status: Home / Self Care | Payer: Medicaid Other | Source: Ambulatory Visit | Attending: Cardiology | Admitting: Cardiology

## 2012-03-04 ENCOUNTER — Ambulatory Visit (HOSPITAL_COMMUNITY)
Admission: RE | Admit: 2012-03-04 | Discharge: 2012-03-04 | Disposition: A | Discharge status: Home / Self Care | Payer: Medicaid Other | Source: Ambulatory Visit | Attending: Pulmonary Disease | Admitting: Pulmonary Disease

## 2012-03-04 DIAGNOSIS — I658 Occlusion and stenosis of other precerebral arteries: Secondary | ICD-10-CM | POA: Insufficient documentation

## 2012-03-04 DIAGNOSIS — Z1231 Encounter for screening mammogram for malignant neoplasm of breast: Secondary | ICD-10-CM | POA: Insufficient documentation

## 2012-03-04 DIAGNOSIS — I6529 Occlusion and stenosis of unspecified carotid artery: Secondary | ICD-10-CM | POA: Insufficient documentation

## 2012-03-04 DIAGNOSIS — Z139 Encounter for screening, unspecified: Secondary | ICD-10-CM

## 2012-03-04 DIAGNOSIS — I771 Stricture of artery: Secondary | ICD-10-CM | POA: Insufficient documentation

## 2012-03-09 ENCOUNTER — Telehealth: Payer: Self-pay | Admitting: Cardiology

## 2012-03-09 NOTE — Telephone Encounter (Signed)
PT CALLING FOR RESULTS OF CAROTID DONE ON 03/04/12/TMJ

## 2012-03-09 NOTE — Telephone Encounter (Signed)
**Note De-identified Rubel Heckard Obfuscation** Pt. Advised, she verbalized understanding./LV 

## 2012-04-02 ENCOUNTER — Other Ambulatory Visit: Payer: Self-pay | Admitting: Cardiology

## 2012-05-13 ENCOUNTER — Encounter: Payer: Self-pay | Admitting: Gastroenterology

## 2012-07-08 ENCOUNTER — Other Ambulatory Visit: Payer: Self-pay | Admitting: Cardiology

## 2012-08-17 ENCOUNTER — Other Ambulatory Visit: Payer: Self-pay | Admitting: Cardiology

## 2012-08-17 MED ORDER — LOVASTATIN 40 MG PO TABS: 80.0000 mg | ORAL_TABLET | Freq: Every day | ORAL | Status: DC

## 2012-09-02 ENCOUNTER — Emergency Department (HOSPITAL_COMMUNITY)
Admission: EM | Admit: 2012-09-02 | Discharge: 2012-09-02 | Disposition: A | Discharge status: Home / Self Care | Payer: Medicaid Other | Attending: Emergency Medicine | Admitting: Emergency Medicine

## 2012-09-02 ENCOUNTER — Emergency Department (HOSPITAL_COMMUNITY): Payer: Medicaid Other

## 2012-09-02 ENCOUNTER — Encounter (HOSPITAL_COMMUNITY): Payer: Self-pay | Admitting: Emergency Medicine

## 2012-09-02 DIAGNOSIS — M545 Low back pain, unspecified: Secondary | ICD-10-CM | POA: Insufficient documentation

## 2012-09-02 DIAGNOSIS — G8929 Other chronic pain: Secondary | ICD-10-CM | POA: Insufficient documentation

## 2012-09-02 DIAGNOSIS — Z9861 Coronary angioplasty status: Secondary | ICD-10-CM | POA: Insufficient documentation

## 2012-09-02 DIAGNOSIS — K219 Gastro-esophageal reflux disease without esophagitis: Secondary | ICD-10-CM | POA: Insufficient documentation

## 2012-09-02 DIAGNOSIS — Z951 Presence of aortocoronary bypass graft: Secondary | ICD-10-CM | POA: Insufficient documentation

## 2012-09-02 DIAGNOSIS — I509 Heart failure, unspecified: Secondary | ICD-10-CM | POA: Insufficient documentation

## 2012-09-02 DIAGNOSIS — G4730 Sleep apnea, unspecified: Secondary | ICD-10-CM | POA: Insufficient documentation

## 2012-09-02 DIAGNOSIS — J029 Acute pharyngitis, unspecified: Secondary | ICD-10-CM | POA: Insufficient documentation

## 2012-09-02 DIAGNOSIS — I10 Essential (primary) hypertension: Secondary | ICD-10-CM | POA: Insufficient documentation

## 2012-09-02 DIAGNOSIS — R509 Fever, unspecified: Secondary | ICD-10-CM | POA: Insufficient documentation

## 2012-09-02 DIAGNOSIS — I2510 Atherosclerotic heart disease of native coronary artery without angina pectoris: Secondary | ICD-10-CM | POA: Insufficient documentation

## 2012-09-02 DIAGNOSIS — J40 Bronchitis, not specified as acute or chronic: Secondary | ICD-10-CM

## 2012-09-02 DIAGNOSIS — Z8719 Personal history of other diseases of the digestive system: Secondary | ICD-10-CM | POA: Insufficient documentation

## 2012-09-02 DIAGNOSIS — J3489 Other specified disorders of nose and nasal sinuses: Secondary | ICD-10-CM | POA: Insufficient documentation

## 2012-09-02 DIAGNOSIS — Z79899 Other long term (current) drug therapy: Secondary | ICD-10-CM | POA: Insufficient documentation

## 2012-09-02 DIAGNOSIS — I252 Old myocardial infarction: Secondary | ICD-10-CM | POA: Insufficient documentation

## 2012-09-02 DIAGNOSIS — Z7982 Long term (current) use of aspirin: Secondary | ICD-10-CM | POA: Insufficient documentation

## 2012-09-02 DIAGNOSIS — Z87442 Personal history of urinary calculi: Secondary | ICD-10-CM | POA: Insufficient documentation

## 2012-09-02 DIAGNOSIS — R05 Cough: Secondary | ICD-10-CM | POA: Insufficient documentation

## 2012-09-02 DIAGNOSIS — R059 Cough, unspecified: Secondary | ICD-10-CM | POA: Insufficient documentation

## 2012-09-02 DIAGNOSIS — F341 Dysthymic disorder: Secondary | ICD-10-CM | POA: Insufficient documentation

## 2012-09-02 DIAGNOSIS — G40909 Epilepsy, unspecified, not intractable, without status epilepticus: Secondary | ICD-10-CM | POA: Insufficient documentation

## 2012-09-02 DIAGNOSIS — J449 Chronic obstructive pulmonary disease, unspecified: Secondary | ICD-10-CM | POA: Insufficient documentation

## 2012-09-02 DIAGNOSIS — Z87891 Personal history of nicotine dependence: Secondary | ICD-10-CM | POA: Insufficient documentation

## 2012-09-02 DIAGNOSIS — J4489 Other specified chronic obstructive pulmonary disease: Secondary | ICD-10-CM | POA: Insufficient documentation

## 2012-09-02 DIAGNOSIS — E785 Hyperlipidemia, unspecified: Secondary | ICD-10-CM | POA: Insufficient documentation

## 2012-09-02 MED ORDER — GUAIFENESIN-CODEINE 100-10 MG/5ML PO SYRP: ORAL_SOLUTION | ORAL | Status: DC

## 2012-09-02 MED ORDER — HYDROCOD POLST-CHLORPHEN POLST 10-8 MG/5ML PO LQCR
5.0000 mL | Freq: Once | ORAL | Status: AC
  Administered 2012-09-02: 5 mL via ORAL
  Filled 2012-09-02: qty 5

## 2012-09-02 MED ORDER — ONDANSETRON 4 MG PO TBDP: 4.0000 mg | ORAL_TABLET | Freq: Three times a day (TID) | ORAL | Status: DC | PRN

## 2012-09-02 NOTE — ED Provider Notes (Signed)
Medical screening examination/treatment/procedure(s) were performed by non-physician practitioner and as supervising physician I was immediately available for consultation/collaboration.   Dione Booze, MD 09/02/12 [phone]

## 2012-09-02 NOTE — ED Notes (Signed)
Pt requesting Rx for nausea med for home. PA aware.

## 2012-09-02 NOTE — ED Notes (Signed)
Pt c/o sore throat, prod cough with green sputum, states blood in sputum this am. C/o hot/chills,sob.gen weakness observed. Alert/oriented.

## 2012-09-02 NOTE — ED Provider Notes (Signed)
History     CSN: [id number]  Arrival date & time 09/02/12  0102   First MD Initiated Contact with Patient 09/02/12 [phone]      Chief Complaint  Patient presents with  . Sore Throat  . Nasal Congestion    (Consider location/radiation/quality/duration/timing/severity/associated sxs/prior treatment) HPI Comments: Cough prod of yellow and blood streaked sputum.  + nasal congestion.  Several episodes of near vomiting after protracted coughing.  Pt of dr. [name].  Patient is a 56 y.o. female presenting with pharyngitis. The history is provided by the patient. No language interpreter was used.  Sore Throat This is a new problem. Episode onset: 3 days ago. The problem occurs constantly. The problem has been unchanged. Associated symptoms include chills, congestion, coughing, a fever and a sore throat. Pertinent negatives include no abdominal pain or chest pain. The symptoms are aggravated by swallowing. Treatments tried: nyquil last PM.    Past Medical History  Diagnosis Date  . Coronary artery disease   . Hypertension   . COPD (chronic obstructive pulmonary disease)   . Hyperlipidemia   . Anxiety and depression   . GERD (gastroesophageal reflux disease)   . Seizure disorder   . Chronic low back pain   . Diverticulitis   . Kidney stones   . Diverticulitis     4 documented episodes by CT; most recent April 2012  . MI (myocardial infarction) Nov 2011    also 2005  . Sleep apnea     has been diagnosed but didn't follow up to get CPAP  . CHF (congestive heart failure)     Past Surgical History  Procedure Date  . Coronary artery bypass graft 2005    LIMA to AL, SVG to LAD, SVG to Cx, SVG-PDA  . Carotid endarterectomy 2005  . Left knee repair   . Right shoulder and elbow repair   . Abdominal hysterectomy   . Coronary angioplasty with stent placement 2011  . Cardiac catheterization Jan 2012    patent stents in RCA graft; CAD stable    Family History  Problem Relation Age of  Onset  . Heart attack Mother   . Heart disease Mother   . Breast cancer Mother   . Heart attack Father   . Heart disease Father   . Colon cancer Neg Hx     History  Substance Use Topics  . Smoking status: Former Smoker -- 1.5 packs/day for 15 years    Types: Cigarettes    Quit date: 02/05/2010  . Smokeless tobacco: Never Used  . Alcohol Use: No     Comment: used cocaine, marijuana in past. last 7 years ago per pt     OB History    Grav Para Term Preterm Abortions TAB SAB Ect Mult Living                  Review of Systems  Constitutional: Positive for fever and chills.  HENT: Positive for congestion and sore throat.   Respiratory: Positive for cough. Negative for shortness of breath and wheezing.   Cardiovascular: Negative for chest pain.  Gastrointestinal: Negative for abdominal pain.    Allergies  Morphine and related  Home Medications   Current Outpatient Rx  Name  Route  Sig  Dispense  Refill  . ALBUTEROL SULFATE HFA 108 (90 BASE) MCG/ACT IN AERS   Inhalation   Inhale 2 puffs into the lungs every 6 (six) hours as needed. Shortness of Breath         .  ALPRAZOLAM 1 MG PO TABS   Oral   Take 1 mg by mouth 4 (four) times daily as needed.          . ASPIRIN 325 MG PO TABS   Oral   Take 325 mg by mouth daily.          Marland Kitchen BENAZEPRIL HCL 20 MG PO TABS   Oral   Take 20 mg by mouth daily.         Marland Kitchen FLUOXETINE HCL 20 MG PO TABS   Oral   Take 20 mg by mouth every morning.          . FUROSEMIDE 80 MG PO TABS   Oral   Take 80 mg by mouth daily.         Marland Kitchen HYDROCODONE-ACETAMINOPHEN 10-325 MG PO TABS   Oral   Take 1 tablet by mouth every 6 (six) hours as needed. Pain         . LEVOTHYROXINE SODIUM 75 MCG PO TABS   Oral   Take 75 mcg by mouth daily.           Marland Kitchen LOVASTATIN 40 MG PO TABS   Oral   Take 2 tablets (80 mg total) by mouth at bedtime.   60 tablet   3   . METOPROLOL SUCCINATE ER 50 MG PO TB24   Oral   Take 50 mg by mouth daily.  Take with or immediately following a meal.         . ADULT MULTIVITAMIN W/MINERALS CH   Oral   Take 1 tablet by mouth 2 (two) times daily.         Marland Kitchen NAPROXEN SODIUM 220 MG PO TABS   Oral   Take 220 mg by mouth 2 (two) times daily as needed. Pain         . NITROGLYCERIN 0.4 MG SL SUBL   Sublingual   Place 0.4 mg under the tongue every 5 (five) minutes as needed. Chest Pain         . OMEPRAZOLE 20 MG PO CPDR   Oral   Take 1 capsule (20 mg total) by mouth daily.   90 capsule   3   . POTASSIUM CHLORIDE CRYS ER 20 MEQ PO TBCR   Oral   Take 20 mEq by mouth 2 (two) times daily.         Marland Kitchen PRASUGREL HCL 10 MG PO TABS   Oral   Take 10 mg by mouth daily.         . GUAIFENESIN-CODEINE 100-10 MG/5ML PO SYRP      10 mls po q 4-6 hrs prn cough   240 mL   0     BP 165/80  Pulse 69  Temp 97.9 F (36.6 C) (Oral)  Resp 19  SpO2 96%  Physical Exam  Nursing note and vitals reviewed. Constitutional: She is oriented to person, place, and time. She appears well-developed and well-nourished. No distress.  HENT:  Head: Normocephalic and atraumatic.  Mouth/Throat: Uvula is midline and mucous membranes are normal. No uvula swelling. No oropharyngeal exudate, posterior oropharyngeal edema, posterior oropharyngeal erythema or tonsillar abscesses.  Eyes: EOM are normal.  Neck: Normal range of motion.  Cardiovascular: Normal rate and regular rhythm.   Pulmonary/Chest: Effort normal and breath sounds normal. No accessory muscle usage. Not tachypneic. No respiratory distress. She has no decreased breath sounds. She has no wheezes. She has no rhonchi. She has no rales. She exhibits no tenderness.  Abdominal: Soft. She exhibits no distension. There is no tenderness.  Musculoskeletal: Normal range of motion.  Neurological: She is alert and oriented to person, place, and time.  Skin: Skin is warm and dry.  Psychiatric: She has a normal mood and affect. Judgment normal.    ED Course    Procedures (including critical care time)   Labs Reviewed  RAPID STREP SCREEN   Dg Chest 2 View  09/02/2012  *RADIOLOGY REPORT*  Clinical Data: Cough.  CHEST - 2 VIEW  Comparison: 09/15/2011  Findings: Prior CABG.  Borderline heart size.  Lungs are clear.  No effusions.  No acute bony abnormality.  IMPRESSION: No active cardiopulmonary disease.   Original Report Authenticated By: Charlett Nose, M.D.      1. Bronchitis   2. Pharyngitis       MDM  rx-robitussin AC, 240 ml Tylenol or ibuprofen F/u with dr. [name] prn        Evalina Field, PA 09/02/12 [phone]

## 2012-09-08 ENCOUNTER — Emergency Department (HOSPITAL_COMMUNITY)
Admission: EM | Admit: 2012-09-08 | Discharge: 2012-09-08 | Disposition: A | Discharge status: Home / Self Care | Payer: Medicaid Other | Attending: Emergency Medicine | Admitting: Emergency Medicine

## 2012-09-08 ENCOUNTER — Encounter (HOSPITAL_COMMUNITY): Payer: Self-pay

## 2012-09-08 ENCOUNTER — Emergency Department (HOSPITAL_COMMUNITY): Payer: Medicaid Other

## 2012-09-08 DIAGNOSIS — Z87891 Personal history of nicotine dependence: Secondary | ICD-10-CM | POA: Insufficient documentation

## 2012-09-08 DIAGNOSIS — Z8669 Personal history of other diseases of the nervous system and sense organs: Secondary | ICD-10-CM | POA: Insufficient documentation

## 2012-09-08 DIAGNOSIS — I10 Essential (primary) hypertension: Secondary | ICD-10-CM | POA: Insufficient documentation

## 2012-09-08 DIAGNOSIS — R51 Headache: Secondary | ICD-10-CM | POA: Insufficient documentation

## 2012-09-08 DIAGNOSIS — Z79899 Other long term (current) drug therapy: Secondary | ICD-10-CM | POA: Insufficient documentation

## 2012-09-08 DIAGNOSIS — I252 Old myocardial infarction: Secondary | ICD-10-CM | POA: Insufficient documentation

## 2012-09-08 DIAGNOSIS — Z9889 Other specified postprocedural states: Secondary | ICD-10-CM | POA: Insufficient documentation

## 2012-09-08 DIAGNOSIS — I509 Heart failure, unspecified: Secondary | ICD-10-CM | POA: Insufficient documentation

## 2012-09-08 DIAGNOSIS — F411 Generalized anxiety disorder: Secondary | ICD-10-CM | POA: Insufficient documentation

## 2012-09-08 DIAGNOSIS — J449 Chronic obstructive pulmonary disease, unspecified: Secondary | ICD-10-CM | POA: Insufficient documentation

## 2012-09-08 DIAGNOSIS — E785 Hyperlipidemia, unspecified: Secondary | ICD-10-CM | POA: Insufficient documentation

## 2012-09-08 DIAGNOSIS — J4489 Other specified chronic obstructive pulmonary disease: Secondary | ICD-10-CM | POA: Insufficient documentation

## 2012-09-08 DIAGNOSIS — F3289 Other specified depressive episodes: Secondary | ICD-10-CM | POA: Insufficient documentation

## 2012-09-08 DIAGNOSIS — I2510 Atherosclerotic heart disease of native coronary artery without angina pectoris: Secondary | ICD-10-CM | POA: Insufficient documentation

## 2012-09-08 DIAGNOSIS — Z7982 Long term (current) use of aspirin: Secondary | ICD-10-CM | POA: Insufficient documentation

## 2012-09-08 DIAGNOSIS — J3489 Other specified disorders of nose and nasal sinuses: Secondary | ICD-10-CM | POA: Insufficient documentation

## 2012-09-08 DIAGNOSIS — Z87442 Personal history of urinary calculi: Secondary | ICD-10-CM | POA: Insufficient documentation

## 2012-09-08 DIAGNOSIS — F329 Major depressive disorder, single episode, unspecified: Secondary | ICD-10-CM | POA: Insufficient documentation

## 2012-09-08 DIAGNOSIS — Z8719 Personal history of other diseases of the digestive system: Secondary | ICD-10-CM | POA: Insufficient documentation

## 2012-09-08 DIAGNOSIS — J069 Acute upper respiratory infection, unspecified: Secondary | ICD-10-CM | POA: Insufficient documentation

## 2012-09-08 MED ORDER — ACETAMINOPHEN-CODEINE 120-12 MG/5ML PO SUSP: 5.0000 mL | Freq: Four times a day (QID) | ORAL | Status: DC | PRN

## 2012-09-08 MED ORDER — PROMETHAZINE HCL 12.5 MG PO TABS
25.0000 mg | ORAL_TABLET | Freq: Once | ORAL | Status: AC
  Administered 2012-09-08: 25 mg via ORAL

## 2012-09-08 MED ORDER — ACETAMINOPHEN-CODEINE 120-12 MG/5ML PO SOLN
5.0000 mL | Freq: Once | ORAL | Status: AC
  Administered 2012-09-08: 5 mL via ORAL
  Filled 2012-09-08: qty 10

## 2012-09-08 MED ORDER — PROMETHAZINE HCL 25 MG PO TABS: 25.0000 mg | ORAL_TABLET | Freq: Four times a day (QID) | ORAL | Status: DC | PRN

## 2012-09-08 MED ORDER — PROMETHAZINE HCL 12.5 MG PO TABS
ORAL_TABLET | ORAL | Status: AC
  Administered 2012-09-08: 25 mg via ORAL
  Filled 2012-09-08: qty 2

## 2012-09-08 NOTE — ED Provider Notes (Signed)
History     CSN: [id number]  Arrival date & time 09/08/12  0044   First MD Initiated Contact with Patient 09/08/12 0127      Chief Complaint  Patient presents with  . Shortness of Breath  . Cough    (Consider location/radiation/quality/duration/timing/severity/associated sxs/prior treatment) HPI History provided by patient. Has been sick for over the last week with cough cold and congestion. Initially productive cough now is dry with sinus congestion, headaches and difficulty sleeping due to persistent cough. She was evaluated here 09/02/2012 and diagnosed with bronchitis and discharged home with cough medication and nausea medication both of which she cannot afford and Medicare does not cover. She has not contacted her primary care physician. She called 911 tonight and brought in by EMS for persistent cough unable to sleep. No recorded fevers. No rash. No known sick contacts. No chest pain. No shortness of breath otherwise. No abdominal pain or vomiting. She denies tobacco use. Symptoms moderate in severity.  Past Medical History  Diagnosis Date  . Coronary artery disease   . Hypertension   . COPD (chronic obstructive pulmonary disease)   . Hyperlipidemia   . Anxiety and depression   . GERD (gastroesophageal reflux disease)   . Seizure disorder   . Chronic low back pain   . Diverticulitis   . Kidney stones   . Diverticulitis     4 documented episodes by CT; most recent April 2012  . MI (myocardial infarction) Nov 2011    also 2005  . Sleep apnea     has been diagnosed but didn't follow up to get CPAP  . CHF (congestive heart failure)     Past Surgical History  Procedure Date  . Coronary artery bypass graft 2005    LIMA to AL, SVG to LAD, SVG to Cx, SVG-PDA  . Carotid endarterectomy 2005  . Left knee repair   . Right shoulder and elbow repair   . Abdominal hysterectomy   . Coronary angioplasty with stent placement 2011  . Cardiac catheterization Jan 2012    patent  stents in RCA graft; CAD stable    Family History  Problem Relation Age of Onset  . Heart attack Mother   . Heart disease Mother   . Breast cancer Mother   . Heart attack Father   . Heart disease Father   . Colon cancer Neg Hx     History  Substance Use Topics  . Smoking status: Former Smoker -- 1.5 packs/day for 15 years    Types: Cigarettes    Quit date: 02/05/2010  . Smokeless tobacco: Never Used  . Alcohol Use: No     Comment: used cocaine, marijuana in past. last 7 years ago per pt     OB History    Grav Para Term Preterm Abortions TAB SAB Ect Mult Living                  Review of Systems  Constitutional: Negative for fever and chills.  HENT: Positive for congestion. Negative for neck pain and neck stiffness.   Eyes: Negative for pain.  Respiratory: Positive for cough and wheezing.   Cardiovascular: Negative for chest pain.  Gastrointestinal: Negative for abdominal pain.  Genitourinary: Negative for dysuria.  Musculoskeletal: Negative for back pain.  Skin: Negative for rash.  Neurological: Negative for headaches.  All other systems reviewed and are negative.    Allergies  Morphine and related  Home Medications   Current Outpatient Rx  Name  Route  Sig  Dispense  Refill  . ALBUTEROL SULFATE HFA 108 (90 BASE) MCG/ACT IN AERS   Inhalation   Inhale 2 puffs into the lungs every 6 (six) hours as needed. Shortness of Breath         . ALPRAZOLAM 1 MG PO TABS   Oral   Take 1 mg by mouth 4 (four) times daily as needed.          . ASPIRIN 325 MG PO TABS   Oral   Take 325 mg by mouth daily.          Marland Kitchen BENAZEPRIL HCL 20 MG PO TABS   Oral   Take 20 mg by mouth daily.         Marland Kitchen FLUOXETINE HCL 20 MG PO TABS   Oral   Take 20 mg by mouth every morning.          . FUROSEMIDE 80 MG PO TABS   Oral   Take 80 mg by mouth daily.         . GUAIFENESIN-CODEINE 100-10 MG/5ML PO SYRP      10 mls po q 4-6 hrs prn cough   240 mL   0   .  HYDROCODONE-ACETAMINOPHEN 10-325 MG PO TABS   Oral   Take 1 tablet by mouth every 6 (six) hours as needed. Pain         . LEVOTHYROXINE SODIUM 75 MCG PO TABS   Oral   Take 75 mcg by mouth daily.           Marland Kitchen LOVASTATIN 40 MG PO TABS   Oral   Take 2 tablets (80 mg total) by mouth at bedtime.   60 tablet   3   . METOPROLOL SUCCINATE ER 50 MG PO TB24   Oral   Take 50 mg by mouth daily. Take with or immediately following a meal.         . ADULT MULTIVITAMIN W/MINERALS CH   Oral   Take 1 tablet by mouth 2 (two) times daily.         Marland Kitchen NAPROXEN SODIUM 220 MG PO TABS   Oral   Take 220 mg by mouth 2 (two) times daily as needed. Pain         . NITROGLYCERIN 0.4 MG SL SUBL   Sublingual   Place 0.4 mg under the tongue every 5 (five) minutes as needed. Chest Pain         . OMEPRAZOLE 20 MG PO CPDR   Oral   Take 1 capsule (20 mg total) by mouth daily.   90 capsule   3   . ONDANSETRON 4 MG PO TBDP   Oral   Take 1 tablet (4 mg total) by mouth every 8 (eight) hours as needed for nausea.   10 tablet   0   . POTASSIUM CHLORIDE CRYS ER 20 MEQ PO TBCR   Oral   Take 20 mEq by mouth 2 (two) times daily.         Marland Kitchen PRASUGREL HCL 10 MG PO TABS   Oral   Take 10 mg by mouth daily.           BP 180/90  Pulse 80  Temp 98.8 F (37.1 C) (Oral)  Resp 24  Ht 5\' 3"  (1.6 m)  Wt 235 lb (106.595 kg)  BMI 41.63 kg/m2  SpO2 97%  Physical Exam  Constitutional: She is oriented to person, place, and time. She appears well-developed  and well-nourished.  HENT:  Head: Normocephalic and atraumatic.       Nasal congestion  Eyes: Conjunctivae normal and EOM are normal. Pupils are equal, round, and reactive to light.  Neck: Trachea normal. Neck supple. No thyromegaly present.  Cardiovascular: Normal rate, regular rhythm, S1 normal, S2 normal and normal pulses.     No systolic murmur is present   No diastolic murmur is present  Pulses:      Radial pulses are 2+ on the right  side, and 2+ on the left side.  Pulmonary/Chest: Effort normal and breath sounds normal. She has no wheezes. She has no rhonchi. She has no rales. She exhibits no tenderness.       Dry cough during exam, moving air well.  Abdominal: Soft. Normal appearance and bowel sounds are normal. There is no tenderness. There is no CVA tenderness and negative Murphy's sign.  Musculoskeletal: Normal range of motion. She exhibits no edema and no tenderness.       BLE:s Calves nontender, no cords or erythema, negative Homans sign  Neurological: She is alert and oriented to person, place, and time. She has normal strength. No cranial nerve deficit or sensory deficit. GCS eye subscore is 4. GCS verbal subscore is 5. GCS motor subscore is 6.  Skin: Skin is warm and dry. No rash noted. She is not diaphoretic.  Psychiatric: Her speech is normal.       Cooperative and appropriate    ED Course  Procedures (including critical care time)  Labs Reviewed - No data to display Dg Chest 2 View  09/08/2012  *RADIOLOGY REPORT*  Clinical Data: Cough and shortness of breath; history of smoking.  CHEST - 2 VIEW  Comparison: Chest radiograph performed 09/02/2012  Findings: The lungs are well-aerated.  Minimal bibasilar airspace opacities likely reflect atelectasis.  Chronic interstitial lung changes are seen.  There is no evidence of pleural effusion or pneumothorax.  The heart is mildly enlarged; the patient is status post median sternotomy, with evidence of prior CABG.  Scattered tiny metallic densities are again noted overlying the chest, likely within the soft tissues.  No acute osseous abnormalities are seen.  IMPRESSION: Minimal bibasilar airspace opacities likely reflect atelectasis; underlying chronic lung changes seen.  Mild cardiomegaly noted.   Original Report Authenticated By: Tonia Ghent, M.D.    Old records reviewed. Chest x-ray today unchanged. Tylenol with codeine provided for cough. URI precautions verbalizes  understood. Patient given prescription for Phenergan and Tylenol with codeine  Patient encouraged to followup with her primary care physician. She agrees to discharge and followup instructions. MDM  Clinical URI. No indication for admit, lab testing or further workup at this time. Old records, vital signs and nursing notes reviewed. Chest x-ray obtained and reviewed as above. Stable for discharge home.        Sunnie Nielsen, MD 09/08/12 [phone]

## 2012-09-08 NOTE — ED Notes (Signed)
Pt c/o SOB and cough for a week. Pt seen in ED on 11/29 and diagnosed with bronchitis

## 2012-10-18 ENCOUNTER — Telehealth: Payer: Self-pay | Admitting: Cardiology

## 2012-10-18 NOTE — Telephone Encounter (Signed)
OMEPRAZOLE NEEDS CALLED IN TO [facility]

## 2012-10-19 MED ORDER — OMEPRAZOLE 20 MG PO CPDR: 20.0000 mg | DELAYED_RELEASE_CAPSULE | Freq: Every day | ORAL | Status: DC

## 2012-10-19 NOTE — Telephone Encounter (Signed)
Pt has recall apt in chart to see WALL 5-14, sent refill to last until apt via escribe

## 2012-10-24 ENCOUNTER — Other Ambulatory Visit: Payer: Self-pay | Admitting: Cardiology

## 2012-10-25 ENCOUNTER — Other Ambulatory Visit: Payer: Self-pay | Admitting: Cardiology

## 2012-10-25 MED ORDER — PRASUGREL HCL 10 MG PO TABS: 10.0000 mg | ORAL_TABLET | Freq: Every day | ORAL | Status: DC

## 2012-11-07 ENCOUNTER — Other Ambulatory Visit: Payer: Self-pay | Admitting: Cardiology

## 2012-11-07 MED ORDER — NITROGLYCERIN 0.4 MG SL SUBL: 0.4000 mg | SUBLINGUAL_TABLET | SUBLINGUAL | Status: DC | PRN

## 2012-12-02 ENCOUNTER — Other Ambulatory Visit: Payer: Self-pay

## 2012-12-02 ENCOUNTER — Other Ambulatory Visit: Payer: Self-pay | Admitting: Cardiology

## 2012-12-02 MED ORDER — OMEPRAZOLE 20 MG PO CPDR: 20.0000 mg | DELAYED_RELEASE_CAPSULE | Freq: Every day | ORAL | Status: DC

## 2012-12-22 ENCOUNTER — Telehealth: Payer: Self-pay | Admitting: Cardiology

## 2012-12-22 NOTE — Telephone Encounter (Signed)
PT STATES SHE THINKS THAT DR WALL IS THE ONE THAT PUT HER ON THYROID MEDS AND SHE NEEDS A REFILL SHE HAS BEEN OUT FOR A COUPLE DAYS.  SHE HAS ALSO CALLED DR HAWKINS OFFICE FOR THIS REFILL BECAUSE SHE DOESN'T KNOW FOR SURE WHO NORMALLY FILLS IT.  PLEASE CALL HER BACK IF IT NEEDS HANDLE BY SOMEONE ELSE/

## 2012-12-23 NOTE — Telephone Encounter (Signed)
Called pt to advise her PCP is the MD to manage this, pt understood and stated that the RX had already been filled from PCP per vm ntoed by PCP nurse

## 2013-01-12 ENCOUNTER — Emergency Department (HOSPITAL_COMMUNITY): Payer: Medicaid Other

## 2013-01-12 ENCOUNTER — Encounter (HOSPITAL_COMMUNITY): Payer: Self-pay | Admitting: Emergency Medicine

## 2013-01-12 ENCOUNTER — Emergency Department (HOSPITAL_COMMUNITY)
Admission: EM | Admit: 2013-01-12 | Discharge: 2013-01-12 | Disposition: A | Discharge status: Home / Self Care | Payer: Medicaid Other | Attending: Emergency Medicine | Admitting: Emergency Medicine

## 2013-01-12 DIAGNOSIS — R112 Nausea with vomiting, unspecified: Secondary | ICD-10-CM | POA: Insufficient documentation

## 2013-01-12 DIAGNOSIS — Z87891 Personal history of nicotine dependence: Secondary | ICD-10-CM | POA: Insufficient documentation

## 2013-01-12 DIAGNOSIS — Z8719 Personal history of other diseases of the digestive system: Secondary | ICD-10-CM | POA: Insufficient documentation

## 2013-01-12 DIAGNOSIS — J4489 Other specified chronic obstructive pulmonary disease: Secondary | ICD-10-CM | POA: Insufficient documentation

## 2013-01-12 DIAGNOSIS — I2510 Atherosclerotic heart disease of native coronary artery without angina pectoris: Secondary | ICD-10-CM | POA: Insufficient documentation

## 2013-01-12 DIAGNOSIS — E860 Dehydration: Secondary | ICD-10-CM

## 2013-01-12 DIAGNOSIS — F341 Dysthymic disorder: Secondary | ICD-10-CM | POA: Insufficient documentation

## 2013-01-12 DIAGNOSIS — Z7982 Long term (current) use of aspirin: Secondary | ICD-10-CM | POA: Insufficient documentation

## 2013-01-12 DIAGNOSIS — G40909 Epilepsy, unspecified, not intractable, without status epilepticus: Secondary | ICD-10-CM | POA: Insufficient documentation

## 2013-01-12 DIAGNOSIS — K5289 Other specified noninfective gastroenteritis and colitis: Secondary | ICD-10-CM | POA: Insufficient documentation

## 2013-01-12 DIAGNOSIS — Z9861 Coronary angioplasty status: Secondary | ICD-10-CM | POA: Insufficient documentation

## 2013-01-12 DIAGNOSIS — R197 Diarrhea, unspecified: Secondary | ICD-10-CM | POA: Insufficient documentation

## 2013-01-12 DIAGNOSIS — Z8739 Personal history of other diseases of the musculoskeletal system and connective tissue: Secondary | ICD-10-CM | POA: Insufficient documentation

## 2013-01-12 DIAGNOSIS — E785 Hyperlipidemia, unspecified: Secondary | ICD-10-CM | POA: Insufficient documentation

## 2013-01-12 DIAGNOSIS — R55 Syncope and collapse: Secondary | ICD-10-CM | POA: Insufficient documentation

## 2013-01-12 DIAGNOSIS — Z951 Presence of aortocoronary bypass graft: Secondary | ICD-10-CM | POA: Insufficient documentation

## 2013-01-12 DIAGNOSIS — J449 Chronic obstructive pulmonary disease, unspecified: Secondary | ICD-10-CM | POA: Insufficient documentation

## 2013-01-12 DIAGNOSIS — R6883 Chills (without fever): Secondary | ICD-10-CM | POA: Insufficient documentation

## 2013-01-12 DIAGNOSIS — Z87442 Personal history of urinary calculi: Secondary | ICD-10-CM | POA: Insufficient documentation

## 2013-01-12 DIAGNOSIS — I10 Essential (primary) hypertension: Secondary | ICD-10-CM | POA: Insufficient documentation

## 2013-01-12 DIAGNOSIS — I509 Heart failure, unspecified: Secondary | ICD-10-CM | POA: Insufficient documentation

## 2013-01-12 DIAGNOSIS — K529 Noninfective gastroenteritis and colitis, unspecified: Secondary | ICD-10-CM

## 2013-01-12 DIAGNOSIS — Z79899 Other long term (current) drug therapy: Secondary | ICD-10-CM | POA: Insufficient documentation

## 2013-01-12 DIAGNOSIS — K219 Gastro-esophageal reflux disease without esophagitis: Secondary | ICD-10-CM | POA: Insufficient documentation

## 2013-01-12 DIAGNOSIS — I252 Old myocardial infarction: Secondary | ICD-10-CM | POA: Insufficient documentation

## 2013-01-12 LAB — CBC WITH DIFFERENTIAL/PLATELET
Basophils Absolute: 0 10*3/uL (ref 0.0–0.1)
Basophils Relative: 0 % (ref 0–1)
Eosinophils Absolute: 0.1 10*3/uL (ref 0.0–0.7)
Eosinophils Relative: 1 % (ref 0–5)
MCH: 31.1 pg (ref 26.0–34.0)
MCV: 93.4 fL (ref 78.0–100.0)
Platelets: 259 10*3/uL (ref 150–400)
RDW: 13.6 % (ref 11.5–15.5)
WBC: 13.8 10*3/uL — ABNORMAL HIGH (ref 4.0–10.5)

## 2013-01-12 LAB — BASIC METABOLIC PANEL
Calcium: 9.2 mg/dL (ref 8.4–10.5)
GFR calc non Af Amer: 54 mL/min — ABNORMAL LOW (ref >90)
Sodium: 134 mEq/L — ABNORMAL LOW (ref 135–145)

## 2013-01-12 LAB — TROPONIN I: Troponin I: <0.3 ng/mL (ref <0.30)

## 2013-01-12 MED ORDER — SODIUM CHLORIDE 0.9 % IV BOLUS (SEPSIS)
1000.0000 mL | Freq: Once | INTRAVENOUS | Status: AC
  Administered 2013-01-12: 500 mL via INTRAVENOUS

## 2013-01-12 MED ORDER — PROMETHAZINE HCL 25 MG PO TABS: 25.0000 mg | ORAL_TABLET | Freq: Four times a day (QID) | ORAL | Status: DC | PRN

## 2013-01-12 MED ORDER — PANTOPRAZOLE SODIUM 40 MG IV SOLR
40.0000 mg | Freq: Once | INTRAVENOUS | Status: AC
Start: 2013-01-12 — End: 2013-01-12
  Administered 2013-01-12: 40 mg via INTRAVENOUS
  Filled 2013-01-12: qty 40

## 2013-01-12 MED ORDER — HYOSCYAMINE SULFATE 0.125 MG PO TABS: 0.1250 mg | ORAL_TABLET | Freq: Four times a day (QID) | ORAL | Status: DC | PRN

## 2013-01-12 NOTE — ED Notes (Signed)
Pt alert & oriented x4, stable gait. Patient given discharge instructions, paperwork & prescription(s). Patient  instructed to stop at the registration desk to finish any additional paperwork. Patient verbalized understanding. Pt left department w/ no further questions. 

## 2013-01-12 NOTE — ED Notes (Signed)
Bolus slowed due to CHF

## 2013-01-12 NOTE — ED Provider Notes (Signed)
History    This chart was scribed for Benny Lennert, MD by Marlyne Beards, ED Scribe. The patient was seen in room APA11/APA11. Patient's care was started at 5:05 PM.    CSN: [id number]  Arrival date & time 01/12/13  1657   First MD Initiated Contact with Patient 01/12/13 1705      Chief Complaint  Patient presents with  . Chest Pain    (Consider location/radiation/quality/duration/timing/severity/associated sxs/prior treatment) Patient is a 57 y.o. female presenting with chest pain. The history is provided by the patient. No language interpreter was used.  Chest Pain Pain location:  Substernal area Pain quality: sharp   Pain severity:  Moderate Onset quality:  Sudden Timing:  Intermittent Associated symptoms: no abdominal pain, no back pain, no cough, no fatigue and no headache    [name] is a 57 y.o. female brought in by EMS who presents to the Emergency Department complaining of moderate constant chest pain onset earlier today (1.5 hours ago).  Pt states that earlier this morning she went to the bathroom when sx's started. She states that she started having episodes of emesis and diarrhea with associated nausea. Shortly after, she went back to the bathroom due to a reoccurrence of sx's resulting in becoming hot resulting in perspiring and feeling of syncope. She reports she remembers waking up to her husband assisting her. She complains that during feeling of syncope the chest pains onset had a sharp sensation and is located in the central area of her chest. Pt states she took 281 mg of aspirin prior to EMS's arrival. Pt's systolic BP en route to the ED was 70 Pt. As of now, pt is alert and oriented in the ED and her nausea seems to be resolved but reports of having chills. No respiratory distress is noted in the ED currently. Pt denies fever, cough, SOB, weakness, and any other associated symptoms. Pt's PCP is Dr. [name].   Past Medical History  Diagnosis Date  . Coronary  artery disease   . Hypertension   . COPD (chronic obstructive pulmonary disease)   . Hyperlipidemia   . Anxiety and depression   . GERD (gastroesophageal reflux disease)   . Seizure disorder   . Chronic low back pain   . Diverticulitis   . Kidney stones   . Diverticulitis     4 documented episodes by CT; most recent April 2012  . MI (myocardial infarction) Nov 2011    also 2005  . Sleep apnea     has been diagnosed but didn't follow up to get CPAP  . CHF (congestive heart failure)     Past Surgical History  Procedure Laterality Date  . Coronary artery bypass graft  2005    LIMA to AL, SVG to LAD, SVG to Cx, SVG-PDA  . Carotid endarterectomy  2005  . Left knee repair    . Right shoulder and elbow repair    . Abdominal hysterectomy    . Coronary angioplasty with stent placement  2011  . Cardiac catheterization  Jan 2012    patent stents in RCA graft; CAD stable    Family History  Problem Relation Age of Onset  . Heart attack Mother   . Heart disease Mother   . Breast cancer Mother   . Heart attack Father   . Heart disease Father   . Colon cancer Neg Hx     History  Substance Use Topics  . Smoking status: Former Smoker -- 1.50 packs/day  for 15 years    Types: Cigarettes    Quit date: 02/05/2010  . Smokeless tobacco: Never Used  . Alcohol Use: No     Comment: used cocaine, marijuana in past. last 7 years ago per pt     OB History   Grav Para Term Preterm Abortions TAB SAB Ect Mult Living                  Review of Systems  Constitutional: Negative for fatigue.  HENT: Negative for congestion, sinus pressure and ear discharge.   Eyes: Negative for discharge.  Respiratory: Negative for cough.   Cardiovascular: Negative for chest pain.  Gastrointestinal: Negative for abdominal pain and diarrhea.  Genitourinary: Negative for frequency and hematuria.  Musculoskeletal: Negative for back pain.  Skin: Negative for rash.  Neurological: Negative for seizures and  headaches.  Psychiatric/Behavioral: Negative for hallucinations.    Allergies  Morphine and related  Home Medications   Current Outpatient Rx  Name  Route  Sig  Dispense  Refill  . acetaminophen-codeine 120-12 MG/5ML suspension   Oral   Take 5 mLs by mouth every 6 (six) hours as needed for pain.   60 mL   0   . albuterol (PROVENTIL HFA;VENTOLIN HFA) 108 (90 BASE) MCG/ACT inhaler   Inhalation   Inhale 2 puffs into the lungs every 6 (six) hours as needed. Shortness of Breath         . ALPRAZolam (XANAX) 1 MG tablet   Oral   Take 1 mg by mouth 4 (four) times daily as needed.          Marland Kitchen aspirin 325 MG tablet   Oral   Take 325 mg by mouth daily.          . benazepril (LOTENSIN) 20 MG tablet   Oral   Take 20 mg by mouth daily.         Marland Kitchen FLUoxetine (PROZAC) 20 MG tablet   Oral   Take 20 mg by mouth every morning.          . furosemide (LASIX) 80 MG tablet   Oral   Take 80 mg by mouth daily.         Marland Kitchen guaiFENesin-codeine (ROBITUSSIN AC) 100-10 MG/5ML syrup      10 mls po q 4-6 hrs prn cough   240 mL   0   . HYDROcodone-acetaminophen (NORCO) 10-325 MG per tablet   Oral   Take 1 tablet by mouth every 6 (six) hours as needed. Pain         . levothyroxine (SYNTHROID, LEVOTHROID) 75 MCG tablet   Oral   Take 75 mcg by mouth daily.           Marland Kitchen lovastatin (MEVACOR) 40 MG tablet      TAKE (2) TABLETS BY MOUTH AT BEDTIME.   60 tablet   5   . metoprolol succinate (TOPROL-XL) 50 MG 24 hr tablet   Oral   Take 50 mg by mouth daily. Take with or immediately following a meal.         . Multiple Vitamin (MULITIVITAMIN WITH MINERALS) TABS   Oral   Take 1 tablet by mouth 2 (two) times daily.         . naproxen sodium (ALEVE) 220 MG tablet   Oral   Take 220 mg by mouth 2 (two) times daily as needed. Pain         . nitroGLYCERIN (NITROSTAT) 0.4 MG SL tablet  Sublingual   Place 1 tablet (0.4 mg total) under the tongue every 5 (five) minutes as  needed. Chest Pain   25 tablet   3   . omeprazole (PRILOSEC) 20 MG capsule   Oral   Take 1 capsule (20 mg total) by mouth daily.   30 capsule   1     Patient needs to keep upcoming appointment to rece ...   . ondansetron (ZOFRAN ODT) 4 MG disintegrating tablet   Oral   Take 1 tablet (4 mg total) by mouth every 8 (eight) hours as needed for nausea.   10 tablet   0   . potassium chloride SA (K-DUR,KLOR-CON) 20 MEQ tablet   Oral   Take 20 mEq by mouth 2 (two) times daily.         . prasugrel (EFFIENT) 10 MG TABS   Oral   Take 1 tablet (10 mg total) by mouth daily.   30 tablet   5   . promethazine (PHENERGAN) 25 MG tablet   Oral   Take 1 tablet (25 mg total) by mouth every 6 (six) hours as needed for nausea.   30 tablet   0     BP 92/60  Pulse 55  Temp(Src) 97.8 F (36.6 C) (Oral)  Resp 17  SpO2 97%  Physical Exam  Constitutional: She is oriented to person, place, and time. She appears well-developed.  HENT:  Head: Normocephalic.  Eyes: Conjunctivae and EOM are normal. No scleral icterus.  Neck: Neck supple. No thyromegaly present.  Cardiovascular: Normal rate and regular rhythm.  Exam reveals no gallop and no friction rub.   No murmur heard. Pulmonary/Chest: No stridor. She has no wheezes. She has no rales. She exhibits no tenderness.  Abdominal: She exhibits no distension. There is no tenderness. There is no rebound.  Mild epigastric pain   Musculoskeletal: Normal range of motion. She exhibits no edema.  Lymphadenopathy:    She has no cervical adenopathy.  Neurological: She is oriented to person, place, and time. Coordination normal.  Skin: No rash noted. No erythema.  Psychiatric: She has a normal mood and affect. Her behavior is normal.    ED Course  Procedures (including critical care time) DIAGNOSTIC STUDIES: Oxygen Saturation is 97% on room air, adequate by my interpretation.    COORDINATION OF CARE: 5:20 PM Discussed ED treatment with pt and  pt agrees.  8:20 PM Discussed lab and xray results with pt and pt agrees. Consulted with pt on taking nausea prescription and doubling her Prilosec.    Labs Reviewed  CBC WITH DIFFERENTIAL - Abnormal; Notable for the following:    WBC 13.8 (*)    Neutrophils Relative 79 (*)    Neutro Abs 10.9 (*)    All other components within normal limits  BASIC METABOLIC PANEL - Abnormal; Notable for the following:    Sodium 134 (*)    Glucose, Bld 111 (*)    Creatinine, Ser 1.11 (*)    GFR calc non Af Amer 54 (*)    GFR calc Af Amer 63 (*)    All other components within normal limits  TROPONIN I   Dg Chest Portable 1 View  01/12/2013  *RADIOLOGY REPORT*  Clinical Data: Chest pain  PORTABLE CHEST - 1 VIEW  Comparison: 09/08/2012  Findings: Lungs are essentially clear.  No focal consolidation.  No pleural effusion or pneumothorax.  Cardiomegaly.  Postsurgical changes related to prior CABG.  Stable tiny metallic foreign bodies overlying the chest.  IMPRESSION: No evidence of acute cardiopulmonary disease.   Original Report Authenticated By: Charline Bills, M.D.    Dg Abd 2 Views  01/12/2013  *RADIOLOGY REPORT*  Clinical Data: Weakness, abdominal pain, nausea/vomiting  ABDOMEN - 2 VIEW  Comparison: CT abdomen pelvis dated 12/03/2011  Findings: Paucity of bowel gas, without findings to suggest small bowel obstruction.  No evidence of free air under the diaphragm on the upright view.  Cholecystectomy clips.  Metallic foreign bodies overlying the right abdomen and left pelvis.  IMPRESSION: No evidence of small bowel obstruction or free air.   Original Report Authenticated By: Charline Bills, M.D.      No diagnosis found.   Date: 01/12/2013  Rate:52  Rhythm: normal sinus rhythm  QRS Axis: normal  Intervals: normal  ST/T Wave abnormalities: nonspecific ST changes  Conduction Disutrbances:none  Narrative Interpretation:   Old EKG Reviewed: unchanged    MDM   The chart was scribed for me under  my direct supervision.  I personally performed the history, physical, and medical decision making and all procedures in the evaluation of this patient.Benny Lennert, MD 01/12/13 2030

## 2013-01-12 NOTE — ED Notes (Signed)
ems called out for cp. Pt took aspirin 281mg  and ntg sl x 2 prior to ems.  BP in 70's systolic en route. Pt arrived alert/oriented/color wnl. Pt c/o central sharp chest pain x 1.5hrs ago. States she became hot/sweaty and feeling like she was going to pass out. Pt had n/v/d this am. Pain worse with palpation. No resp distress observed. NAD

## 2013-02-22 ENCOUNTER — Telehealth: Payer: Self-pay | Admitting: Cardiology

## 2013-02-22 MED ORDER — PRASUGREL HCL 10 MG PO TABS: 10.0000 mg | ORAL_TABLET | Freq: Every morning | ORAL | Status: DC

## 2013-02-22 NOTE — Telephone Encounter (Signed)
Noted pt has been sent a recall for 02-14-13 to schedule f/u with TW, sent pt refill to last until pt can schedule for OV,left vm to advise

## 2013-02-22 NOTE — Telephone Encounter (Signed)
Patient is calling to let us know [facility] has not received the fax back from [place] regarding her Effient. She tells [place] she has been on this medication for x 1 week.  She is out.

## 2013-02-28 ENCOUNTER — Telehealth: Payer: Self-pay | Admitting: Cardiology

## 2013-02-28 NOTE — Telephone Encounter (Signed)
It doesn't appear this pt is on coumadin please review chart. If she is we do not manage her, I see no anticoagulation notes.

## 2013-02-28 NOTE — Telephone Encounter (Signed)
Patient needs to stop Coumadin prior to procedure.  Needs to know if this is ok. / tgs

## 2013-03-23 ENCOUNTER — Other Ambulatory Visit (HOSPITAL_COMMUNITY): Payer: Self-pay | Admitting: Pulmonary Disease

## 2013-03-23 ENCOUNTER — Ambulatory Visit (HOSPITAL_COMMUNITY)
Admission: RE | Admit: 2013-03-23 | Discharge: 2013-03-23 | Disposition: A | Discharge status: Home / Self Care | Payer: Medicaid Other | Source: Ambulatory Visit | Attending: Pulmonary Disease | Admitting: Pulmonary Disease

## 2013-03-23 DIAGNOSIS — M549 Dorsalgia, unspecified: Secondary | ICD-10-CM

## 2013-03-23 DIAGNOSIS — M25562 Pain in left knee: Secondary | ICD-10-CM

## 2013-03-23 DIAGNOSIS — M545 Low back pain, unspecified: Secondary | ICD-10-CM | POA: Insufficient documentation

## 2013-03-23 DIAGNOSIS — M25551 Pain in right hip: Secondary | ICD-10-CM

## 2013-03-23 DIAGNOSIS — M5137 Other intervertebral disc degeneration, lumbosacral region: Secondary | ICD-10-CM | POA: Insufficient documentation

## 2013-03-23 DIAGNOSIS — M25569 Pain in unspecified knee: Secondary | ICD-10-CM | POA: Insufficient documentation

## 2013-03-23 DIAGNOSIS — M47817 Spondylosis without myelopathy or radiculopathy, lumbosacral region: Secondary | ICD-10-CM | POA: Insufficient documentation

## 2013-03-23 DIAGNOSIS — M25552 Pain in left hip: Secondary | ICD-10-CM

## 2013-03-23 DIAGNOSIS — M25561 Pain in right knee: Secondary | ICD-10-CM

## 2013-03-23 DIAGNOSIS — R209 Unspecified disturbances of skin sensation: Secondary | ICD-10-CM | POA: Insufficient documentation

## 2013-03-23 DIAGNOSIS — M25559 Pain in unspecified hip: Secondary | ICD-10-CM | POA: Insufficient documentation

## 2013-03-23 DIAGNOSIS — M51379 Other intervertebral disc degeneration, lumbosacral region without mention of lumbar back pain or lower extremity pain: Secondary | ICD-10-CM | POA: Insufficient documentation

## 2013-04-04 ENCOUNTER — Other Ambulatory Visit: Payer: Self-pay | Admitting: Cardiology

## 2013-04-05 ENCOUNTER — Other Ambulatory Visit: Payer: Self-pay | Admitting: *Deleted

## 2013-04-05 MED ORDER — OMEPRAZOLE 20 MG PO CPDR: 20.0000 mg | DELAYED_RELEASE_CAPSULE | Freq: Every morning | ORAL | Status: DC

## 2013-04-05 MED ORDER — FUROSEMIDE 80 MG PO TABS: 80.0000 mg | ORAL_TABLET | Freq: Every morning | ORAL | Status: DC

## 2013-04-05 MED ORDER — METOPROLOL SUCCINATE ER 50 MG PO TB24: 50.0000 mg | ORAL_TABLET | Freq: Every morning | ORAL | Status: DC

## 2013-04-05 NOTE — Telephone Encounter (Signed)
omeprazole (PRILOSEC) 20 MG capsule  PT NEEDS RX CALLED IN TODAY IF POSSIBLE.

## 2013-04-05 NOTE — Addendum Note (Signed)
Addended by: Thompson Grayer on: 04/05/2013 09:34 AM   Modules accepted: Orders

## 2013-04-06 ENCOUNTER — Other Ambulatory Visit (HOSPITAL_COMMUNITY): Payer: Self-pay | Admitting: Pulmonary Disease

## 2013-04-06 DIAGNOSIS — M549 Dorsalgia, unspecified: Secondary | ICD-10-CM

## 2013-04-06 NOTE — Telephone Encounter (Signed)
.  left message to have patient return my call to advise pt overdue for apt

## 2013-04-11 NOTE — Telephone Encounter (Signed)
.  left message to have patient return my call.  

## 2013-04-13 ENCOUNTER — Ambulatory Visit (HOSPITAL_COMMUNITY): Payer: Medicaid Other

## 2013-04-14 NOTE — Telephone Encounter (Signed)
.  left message to have patient return my call.  

## 2013-04-18 ENCOUNTER — Encounter: Payer: Self-pay | Admitting: *Deleted

## 2013-04-18 NOTE — Telephone Encounter (Signed)
Unable to leave message for pt times 3 per [employer] vm box states pt is not available and can not except calls at this time also noted on 02-22-13 phone note pt was advised at that time she was overdue and needed apt in order to continue to receive medication refills, Several attempts have been made to contact pt with no resolve, sent letter to pt address noted in chart with results/instructions/prescriptions. Advised pt to call office if any questions or concerns per letter.

## 2013-04-18 NOTE — Addendum Note (Signed)
Addended by: Derry Lory A on: 04/18/2013 01:27 PM   Modules accepted: Orders

## 2013-04-19 ENCOUNTER — Ambulatory Visit (HOSPITAL_COMMUNITY): Payer: Medicaid Other

## 2013-04-25 ENCOUNTER — Ambulatory Visit (HOSPITAL_COMMUNITY)
Admission: RE | Admit: 2013-04-25 | Discharge: 2013-04-25 | Disposition: A | Discharge status: Home / Self Care | Payer: Medicaid Other | Source: Ambulatory Visit | Attending: Pulmonary Disease | Admitting: Pulmonary Disease

## 2013-04-25 DIAGNOSIS — M48061 Spinal stenosis, lumbar region without neurogenic claudication: Secondary | ICD-10-CM | POA: Insufficient documentation

## 2013-04-25 DIAGNOSIS — M5380 Other specified dorsopathies, site unspecified: Secondary | ICD-10-CM | POA: Insufficient documentation

## 2013-04-25 DIAGNOSIS — M545 Low back pain, unspecified: Secondary | ICD-10-CM | POA: Insufficient documentation

## 2013-04-25 DIAGNOSIS — M5126 Other intervertebral disc displacement, lumbar region: Secondary | ICD-10-CM | POA: Insufficient documentation

## 2013-04-25 DIAGNOSIS — M549 Dorsalgia, unspecified: Secondary | ICD-10-CM

## 2013-04-25 DIAGNOSIS — R209 Unspecified disturbances of skin sensation: Secondary | ICD-10-CM | POA: Insufficient documentation

## 2013-05-03 ENCOUNTER — Other Ambulatory Visit: Payer: Self-pay | Admitting: Cardiology

## 2013-05-04 ENCOUNTER — Other Ambulatory Visit: Payer: Self-pay | Admitting: Cardiology

## 2013-05-22 ENCOUNTER — Telehealth: Payer: Self-pay | Admitting: *Deleted

## 2013-05-22 ENCOUNTER — Ambulatory Visit: Payer: Medicaid Other | Admitting: Cardiology

## 2013-05-22 MED ORDER — BENAZEPRIL HCL 20 MG PO TABS: 20.0000 mg | ORAL_TABLET | Freq: Every morning | ORAL | Status: DC

## 2013-05-22 MED ORDER — PRASUGREL HCL 10 MG PO TABS: 10.0000 mg | ORAL_TABLET | Freq: Every morning | ORAL | Status: DC

## 2013-05-22 MED ORDER — POTASSIUM CHLORIDE CRYS ER 20 MEQ PO TBCR: 20.0000 meq | EXTENDED_RELEASE_TABLET | Freq: Every morning | ORAL | Status: DC

## 2013-05-22 MED ORDER — NITROGLYCERIN 0.4 MG SL SUBL: 0.4000 mg | SUBLINGUAL_TABLET | SUBLINGUAL | Status: DC | PRN

## 2013-05-22 MED ORDER — FUROSEMIDE 80 MG PO TABS: 80.0000 mg | ORAL_TABLET | Freq: Every morning | ORAL | Status: DC

## 2013-05-22 MED ORDER — LOVASTATIN 40 MG PO TABS: 40.0000 mg | ORAL_TABLET | Freq: Every day | ORAL | Status: DC

## 2013-05-22 MED ORDER — METOPROLOL SUCCINATE ER 50 MG PO TB24: 50.0000 mg | ORAL_TABLET | Freq: Every morning | ORAL | Status: DC

## 2013-05-22 NOTE — Telephone Encounter (Signed)
Called pt to advise her prescriptions have been called into her pharmacy to last until her upcoming OV, pt understood that she can needs to keep this apt in order to continue to receive her RX's pt understood and noted some domestic issues this morning that had her anxiety level increased, pt noted a sharp chest pain that resolved with one nitro, pt noted she is under an extreme amount of stress right now, therefore the reason she had to cancel her apt, pt denies chest pain/arm/jaw pain/SOB/Swelling, noted BP was elevated earlier however has now subsided, pt advised she will take her anxiety medication as directed to assist with this current issue per really thinks it is anxiety related,pt advised If your signs and symptoms worsen please seek medical attention in your local Emergency Room, do not attempt to drive yourself per further endangerment, call 911 for transport or have someone drive you to the ER as soon as possible

## 2013-05-22 NOTE — Telephone Encounter (Signed)
Pt had to cancel appt today do to family problems. She needs her medications called in . She is scheduled for 06/06/13 with Dr Wyline Mood

## 2013-06-06 ENCOUNTER — Other Ambulatory Visit: Payer: Self-pay | Admitting: Cardiology

## 2013-06-06 ENCOUNTER — Encounter: Payer: Medicaid Other | Admitting: Cardiology

## 2013-06-06 NOTE — Progress Notes (Signed)
   Patient ID: [name], female    DOB: [birth year], 57 y.o.   MRN: [id number]  HPI 1.CAD:   Chest pain ER visit 01/2013 w/ substernal chest pain, per ER notes desribed as moderate in severity with sudden onset - cxr: no acute pathology, trop neg x 1, no acute ekg changes - prilosec increased  [name] is a 57 y.o. female brought in by EMS who presents to the Emergency Department complaining of moderate constant chest pain onset earlier today (1.5 hours ago). Pt states that earlier this morning she went to the bathroom when sx's started. She states that she started having episodes of emesis and diarrhea with associated nausea. Shortly after, she went back to the bathroom due to a reoccurrence of sx's resulting in becoming hot resulting in perspiring and feeling of syncope. She reports she remembers waking up to her husband assisting her. She complains that during feeling of syncope the chest pains onset had a sharp sensation and is located in the central area of her chest. Pt states she took 281 mg of aspirin prior to EMS's arrival. Pt's systolic BP en route to the ED was 70 Pt. As of now, pt is alert and oriented in the ED and her nausea seems to be resolved but reports of having chills. No respiratory distress is noted in the ED currently. Pt denies fever, cough, SOB, weakness, and any other associated symptoms. Pt's PCP is Dr. [name].   2. Carotid stenosis - prior endarterectomy - carotid US 02/2012: extensive bilateral plaque w/ 50-69% stenosis bilaterally, increased velocity in left ICA since last study  PMH  Coronary artery disease: CABG 2005: LIMA to AL, SVG to LAD, SVG to LCX, SVG to PDA - hx of prior stents in 2011 - most recent cath Jan 2012 w/ patent stents in RCA Carotid stenosis w/ prior endarterectomy  Hypertension  .  COPD (chronic obstructive pulmonary disease)   Hyperlipidemia   Anxiety and depression   GERD (gastroesophageal reflux disease)   Seizure  disorder   Chronic low back pain  Diverticulitis    Kidney stones   Diverticulitis  4 documented episodes by CT; most recent April 2012    MI (myocardial infarction)  Nov 2011also 2005   Sleep apnea  has been diagnosed but didn't follow up to get CPAP    CHF (congestive heart failure)    Medications   Family History   Social History    Review of Systems    Physical Exam      A/P

## 2013-06-08 ENCOUNTER — Other Ambulatory Visit: Payer: Self-pay | Admitting: *Deleted

## 2013-06-08 MED ORDER — OMEPRAZOLE 20 MG PO CPDR: DELAYED_RELEASE_CAPSULE | ORAL | Status: DC

## 2013-06-15 ENCOUNTER — Encounter: Payer: Medicaid Other | Admitting: Cardiology

## 2013-06-15 NOTE — Progress Notes (Signed)
Patient ID: FERRIS TALLY, female   DOB: [birth year], 57 y.o.   MRN: [id number]   Clinical Summary Ms. Demasi is a 57 y.o.female  1. CAD - history of prior MI - ?CP in 05/2013?  2. HTN   3. HL - no recent panel in our system.    Past Medical History  Diagnosis Date  . Coronary artery disease   . Hypertension   . COPD (chronic obstructive pulmonary disease)   . Hyperlipidemia   . Anxiety and depression   . GERD (gastroesophageal reflux disease)   . Seizure disorder   . Chronic low back pain   . Diverticulitis   . Kidney stones   . Diverticulitis     4 documented episodes by CT; most recent April 2012  . MI (myocardial infarction) Nov 2011    also 2005  . Sleep apnea     has been diagnosed but didn't follow up to get CPAP  . CHF (congestive heart failure)      Allergies  Allergen Reactions  . Morphine And Related Itching     Current Outpatient Prescriptions  Medication Sig Dispense Refill  . albuterol (PROAIR HFA) 108 (90 BASE) MCG/ACT inhaler Inhale 2 puffs into the lungs every 6 (six) hours as needed for wheezing or shortness of breath.      . ALPRAZolam (XANAX) 1 MG tablet Take 1 mg by mouth 4 (four) times daily as needed for sleep or anxiety.       Marland Kitchen aspirin 325 MG tablet Take 325 mg by mouth every morning.       . benazepril (LOTENSIN) 20 MG tablet Take 1 tablet (20 mg total) by mouth every morning.  30 tablet  0  . FLUoxetine (PROZAC) 20 MG tablet Take 20 mg by mouth every morning.       . furosemide (LASIX) 80 MG tablet Take 1 tablet (80 mg total) by mouth every morning.  30 tablet  0  . HYDROcodone-acetaminophen (NORCO) 10-325 MG per tablet Take 1 tablet by mouth every 6 (six) hours as needed. Pain      . hyoscyamine (LEVSIN) 0.125 MG tablet Take 1 tablet (0.125 mg total) by mouth every 6 (six) hours as needed for cramping.  15 tablet  0  . levothyroxine (SYNTHROID, LEVOTHROID) 75 MCG tablet Take 75 mcg by mouth every morning.       . lovastatin (MEVACOR) 40  MG tablet Take 1 tablet (40 mg total) by mouth at bedtime.  60 tablet  0  . metoprolol succinate (TOPROL-XL) 50 MG 24 hr tablet Take 1 tablet (50 mg total) by mouth every morning. Take with or immediately following a meal.  30 tablet  0  . Multiple Vitamin (MULITIVITAMIN WITH MINERALS) TABS Take 1 tablet by mouth 2 (two) times daily.      . naproxen sodium (ALEVE) 220 MG tablet Take 220 mg by mouth daily as needed (for pain). Pain      . nitroGLYCERIN (NITROSTAT) 0.4 MG SL tablet Place 1 tablet (0.4 mg total) under the tongue every 5 (five) minutes as needed. Chest Pain  25 tablet  0  . omeprazole (PRILOSEC) 20 MG capsule TAKE ONE BY MOUTH CAPSULE DAILY.  30 capsule  0  . potassium chloride SA (K-DUR,KLOR-CON) 20 MEQ tablet Take 1 tablet (20 mEq total) by mouth every morning.  30 tablet  0  . prasugrel (EFFIENT) 10 MG TABS tablet Take 1 tablet (10 mg total) by mouth every morning.  30 tablet  0  . promethazine (PHENERGAN) 25 MG tablet Take 1 tablet (25 mg total) by mouth every 6 (six) hours as needed for nausea.  15 tablet  0  . [DISCONTINUED] carbamazepine (TEGRETOL XR) 200 MG 12 hr tablet Take 200 mg by mouth 2 (two) times daily.        . [DISCONTINUED] lamoTRIgine (LAMICTAL) 100 MG tablet Take 100 mg by mouth 2 (two) times daily.         No current facility-administered medications for this visit.     Past Surgical History  Procedure Laterality Date  . Coronary artery bypass graft  2005    LIMA to AL, SVG to LAD, SVG to Cx, SVG-PDA  . Carotid endarterectomy  2005  . Left knee repair    . Right shoulder and elbow repair    . Abdominal hysterectomy    . Coronary angioplasty with stent placement  2011  . Cardiac catheterization  Jan 2012    patent stents in RCA graft; CAD stable     Allergies  Allergen Reactions  . Morphine And Related Itching      Family History  Problem Relation Age of Onset  . Heart attack Mother   . Heart disease Mother   . Breast cancer Mother   . Heart  attack Father   . Heart disease Father   . Colon cancer Neg Hx      Social History Ms. Mach reports that she quit smoking about 3 years ago. Her smoking use included Cigarettes. She has a 22.5 pack-year smoking history. She has never used smokeless tobacco. Ms. Harries reports that she does not drink alcohol.   Review of Systems   Physical Examination There were no vitals filed for this visit. There were no vitals filed for this visit.    Diagnostic Studies Echo 07/2009: LVEF 50-55%, mild to mod LVH. Akinesis basilar posterior myocardium. Mild LAE  02/2012 Carotid US: extensive plaque formation bilaterally w/ 50-59% stenosis bilaterally  Labs 01/2013: K 3.9 Cr 1.1 BUN 14 GFR 63    Assessment and Plan     Antoine Poche, M.D., F.A.C.C.

## 2013-06-20 ENCOUNTER — Other Ambulatory Visit: Payer: Self-pay | Admitting: Cardiology

## 2013-06-22 ENCOUNTER — Ambulatory Visit (INDEPENDENT_AMBULATORY_CARE_PROVIDER_SITE_OTHER): Payer: Medicaid Other | Admitting: Cardiology

## 2013-06-22 ENCOUNTER — Encounter: Payer: Self-pay | Admitting: Cardiology

## 2013-06-22 VITALS — BP 140/92 | HR 65 | Ht 63.0 in | Wt 239.0 lb

## 2013-06-22 DIAGNOSIS — R079 Chest pain, unspecified: Secondary | ICD-10-CM

## 2013-06-22 MED ORDER — AMLODIPINE BESYLATE 5 MG PO TABS: 5.0000 mg | ORAL_TABLET | Freq: Every day | ORAL | Status: DC

## 2013-06-22 MED ORDER — METOPROLOL SUCCINATE ER 50 MG PO TB24: 50.0000 mg | ORAL_TABLET | Freq: Every morning | ORAL | Status: DC

## 2013-06-22 NOTE — Progress Notes (Addendum)
Clinical Summary [name] is a 57 y.o.female 1. CAD  - CABG 2005 4 vessel - history of prior MI, prior stents remotely.  - has had some chest pain. Notes that episodes started about 6 months ago. Sharp pain in mid chest, 5/10. +diaphoresis +SOB. + palps. Episodes last 5-10 min, better w/ NG. Usually occurs w/ exertion. Not positional. Episodes occurs approx once a week. Often gets relief w/ xanax.    Past Medical History  Diagnosis Date  . Coronary artery disease   . Hypertension   . COPD (chronic obstructive pulmonary disease)   . Hyperlipidemia   . Anxiety and depression   . GERD (gastroesophageal reflux disease)   . Seizure disorder   . Chronic low back pain   . Diverticulitis   . Kidney stones   . Diverticulitis     4 documented episodes by CT; most recent April 2012  . MI (myocardial infarction) Nov 2011    also 2005  . Sleep apnea     has been diagnosed but didn't follow up to get CPAP  . CHF (congestive heart failure)      Allergies  Allergen Reactions  . Lipitor [Atorvastatin]     Cramps  . Morphine And Related Itching     Current Outpatient Prescriptions  Medication Sig Dispense Refill  . albuterol (PROAIR HFA) 108 (90 BASE) MCG/ACT inhaler Inhale 2 puffs into the lungs every 6 (six) hours as needed for wheezing or shortness of breath.      . ALPRAZolam (XANAX) 1 MG tablet Take 1 mg by mouth 4 (four) times daily as needed for sleep or anxiety.       Marland Kitchen aspirin 325 MG tablet Take 325 mg by mouth every morning.       . benazepril (LOTENSIN) 20 MG tablet TAKE (1) TABLET BY MOUTH EACH MORNING.  30 tablet  6  . FLUoxetine (PROZAC) 20 MG tablet Take 20 mg by mouth every morning.       . furosemide (LASIX) 80 MG tablet Take 1 tablet (80 mg total) by mouth every morning.  30 tablet  0  . HYDROcodone-acetaminophen (NORCO) 10-325 MG per tablet Take 1 tablet by mouth every 6 (six) hours as needed. Pain      . levothyroxine (SYNTHROID, LEVOTHROID) 75 MCG tablet Take 75  mcg by mouth every morning.       . metoprolol succinate (TOPROL-XL) 50 MG 24 hr tablet Take 1 tablet (50 mg total) by mouth every morning. Take with or immediately following a meal.  30 tablet  0  . Multiple Vitamin (MULITIVITAMIN WITH MINERALS) TABS Take 1 tablet by mouth 2 (two) times daily.      . nitroGLYCERIN (NITROSTAT) 0.4 MG SL tablet Place 1 tablet (0.4 mg total) under the tongue every 5 (five) minutes as needed. Chest Pain  25 tablet  0  . omeprazole (PRILOSEC) 20 MG capsule TAKE ONE BY MOUTH CAPSULE DAILY.  30 capsule  0  . potassium chloride SA (K-DUR,KLOR-CON) 20 MEQ tablet Take 1 tablet (20 mEq total) by mouth every morning.  30 tablet  0  . prasugrel (EFFIENT) 10 MG TABS tablet Take 1 tablet (10 mg total) by mouth every morning.  30 tablet  0  . rosuvastatin (CRESTOR) 5 MG tablet Take 5 mg by mouth daily.      . [DISCONTINUED] carbamazepine (TEGRETOL XR) 200 MG 12 hr tablet Take 200 mg by mouth 2 (two) times daily.        . [  DISCONTINUED] lamoTRIgine (LAMICTAL) 100 MG tablet Take 100 mg by mouth 2 (two) times daily.         No current facility-administered medications for this visit.     Past Surgical History  Procedure Laterality Date  . Coronary artery bypass graft  2005    LIMA to AL, SVG to LAD, SVG to Cx, SVG-PDA  . Carotid endarterectomy  2005  . Left knee repair    . Right shoulder and elbow repair    . Abdominal hysterectomy    . Coronary angioplasty with stent placement  2011  . Cardiac catheterization  Jan 2012    patent stents in RCA graft; CAD stable     Allergies  Allergen Reactions  . Lipitor [Atorvastatin]     Cramps  . Morphine And Related Itching      Family History  Problem Relation Age of Onset  . Heart attack Mother   . Heart disease Mother   . Breast cancer Mother   . Heart attack Father   . Heart disease Father   . Colon cancer Neg Hx      Social History Ms. Fayad reports that she quit smoking about 3 years ago. Her smoking use  included Cigarettes. She has a 22.5 pack-year smoking history. She has never used smokeless tobacco. Ms. Janusz reports that she does not drink alcohol.   Review of Systems 12 point ROS negative other than reported in HPI  Physical Examination Filed Vitals:   06/22/13 1138  BP: 140/92  Pulse: 65   Filed Weights   06/22/13 1138  Weight: 239 lb 0.6 oz (108.428 kg)    Gen: resting comfortably, NAD HEENT: no scleral icterus, pupils equal round and reactive, no palptable cervical adenopathy CV: RRR, 2/6 systolic murmur RUSB, no JVD, no carotid bruits Pulm: CTAB Abd: soft, NT, ND NABS, no hepatosplenomegaly Ext: warm, no edema.  Skin: warm, no rash Neuro: A&Ox3, no focal deficits    Diagnostic Studies  Echo 07/2009: LVEF 50-55%, mild to mod LVH. Akinesis basilar posterior myocardium. Mild LAE  02/2012 Carotid US: extensive plaque formation bilaterally w/ 50-59% stenosis bilaterally  Labs 01/2013: K 3.9 Cr 1.1 BUN 14 GFR 63    02/2010 Myoview:  Abnormal stress nuclear myocardial study revealing impaired exercise capacity, a rapid increase in heart rate at low level exercise consistent with physical deconditioning, mild left ventricular dilatation and mildly impaired left ventricular systolic function. By scintigraphic imaging, there was a sizable inferolateral infarction with substantial superimposed ischemia. Apparent septal perfusion abnormalities likely represent breast attenuation artifact. Other findings as noted.  Jan 2012 Cath: LM normal. LAD occluded after perforator. D1 subtotal occlusion. Some filling of collateral branches from LIMA. LCX occluded in midvessel. RCA 100% w/ left to right and right to right collaterals. Distal PDA fills from SVG. LIMA patent. SVG to RCA patent. LVEF 40%. Patent RCA grafts.   06/22/13 EKG: sinus, rate 65, normal axis, Q waves inferior leads, TWI w/ ST depression lateral leads old from prior EKGs  Assessment and Plan  1. Chest pain: unclear  by history, however she does warrant further evaluation given her history. She has complex anatomy, and has been studied multiple times with stress tests and angiograms in the past.  - I will review her previous stress test results and corresponding caths to decide best course of action. Given her complex anatomy of native vessels, collaterals, and bypass grafts a MPI may help identify the area of concern that may be difficult to isolate  angiographically - will add norvasc as a second antianginal - counseled if her symptoms progress to see more immediate evaluation.    Antoine Poche, M.D., F.A.C.C.

## 2013-06-22 NOTE — Patient Instructions (Addendum)
Your physician recommends that you schedule a follow-up appointment in: 6 WEEKS  Your physician has recommended you make the following change in your medication:   1) NORVASC 5 MG ONCE DAILY

## 2013-06-26 ENCOUNTER — Other Ambulatory Visit: Payer: Self-pay | Admitting: Cardiology

## 2013-06-26 DIAGNOSIS — R079 Chest pain, unspecified: Secondary | ICD-10-CM

## 2013-07-04 ENCOUNTER — Encounter (HOSPITAL_COMMUNITY): Payer: Medicaid Other

## 2013-07-04 ENCOUNTER — Ambulatory Visit (HOSPITAL_COMMUNITY): Payer: Medicaid Other

## 2013-07-06 ENCOUNTER — Other Ambulatory Visit: Payer: Self-pay | Admitting: Cardiology

## 2013-07-10 ENCOUNTER — Other Ambulatory Visit: Payer: Self-pay | Admitting: *Deleted

## 2013-07-10 MED ORDER — OMEPRAZOLE 20 MG PO CPDR: DELAYED_RELEASE_CAPSULE | ORAL | Status: DC

## 2013-07-13 ENCOUNTER — Encounter (HOSPITAL_COMMUNITY): Admission: RE | Admit: 2013-07-13 | Payer: Medicaid Other | Source: Ambulatory Visit

## 2013-07-13 ENCOUNTER — Encounter (HOSPITAL_COMMUNITY): Payer: Medicaid Other

## 2013-07-25 ENCOUNTER — Encounter (HOSPITAL_COMMUNITY): Payer: Medicaid Other

## 2013-07-25 ENCOUNTER — Encounter (HOSPITAL_COMMUNITY): Admission: RE | Admit: 2013-07-25 | Payer: Medicaid Other | Source: Ambulatory Visit

## 2013-08-03 ENCOUNTER — Other Ambulatory Visit: Payer: Self-pay | Admitting: Cardiology

## 2013-08-03 ENCOUNTER — Ambulatory Visit: Payer: Medicaid Other | Admitting: Cardiology

## 2013-08-11 ENCOUNTER — Encounter (HOSPITAL_COMMUNITY): Payer: Medicaid Other

## 2013-08-11 ENCOUNTER — Ambulatory Visit (HOSPITAL_COMMUNITY): Payer: Medicaid Other

## 2013-08-11 ENCOUNTER — Inpatient Hospital Stay (HOSPITAL_COMMUNITY): Admission: RE | Admit: 2013-08-11 | Payer: Medicaid Other | Source: Ambulatory Visit

## 2013-08-28 ENCOUNTER — Telehealth: Payer: Self-pay | Admitting: Cardiology

## 2013-08-28 MED ORDER — ADULT MULTIVITAMIN W/MINERALS CH: 1.0000 | ORAL_TABLET | Freq: Two times a day (BID) | ORAL | Status: AC

## 2013-08-28 NOTE — Telephone Encounter (Signed)
Received fax refill request  Rx # [id number] Medication:  Nitrostat 0.4 mg 4X25 tb Qty 25 tabs  Sig:  Dissolve one tablet under tongue every 5 minutes up to 15 min for chest pain.  If no relief call 911. Physician:  Daleen Squibb

## 2013-09-07 ENCOUNTER — Other Ambulatory Visit: Payer: Self-pay | Admitting: Cardiology

## 2013-09-13 ENCOUNTER — Encounter (HOSPITAL_COMMUNITY): Payer: Medicaid Other

## 2013-09-18 ENCOUNTER — Ambulatory Visit: Payer: Medicaid Other | Admitting: Cardiology

## 2013-10-01 ENCOUNTER — Observation Stay (HOSPITAL_COMMUNITY)
Admission: EM | Admit: 2013-10-01 | Discharge: 2013-10-04 | Disposition: A | Discharge status: Home / Self Care | Payer: Medicaid Other | Attending: Cardiology | Admitting: Cardiology

## 2013-10-01 ENCOUNTER — Encounter (HOSPITAL_COMMUNITY): Payer: Self-pay | Admitting: Emergency Medicine

## 2013-10-01 ENCOUNTER — Emergency Department (HOSPITAL_COMMUNITY): Payer: Medicaid Other

## 2013-10-01 DIAGNOSIS — G8929 Other chronic pain: Secondary | ICD-10-CM | POA: Insufficient documentation

## 2013-10-01 DIAGNOSIS — J189 Pneumonia, unspecified organism: Secondary | ICD-10-CM | POA: Insufficient documentation

## 2013-10-01 DIAGNOSIS — E785 Hyperlipidemia, unspecified: Secondary | ICD-10-CM | POA: Diagnosis present

## 2013-10-01 DIAGNOSIS — D649 Anemia, unspecified: Secondary | ICD-10-CM | POA: Diagnosis present

## 2013-10-01 DIAGNOSIS — F411 Generalized anxiety disorder: Secondary | ICD-10-CM | POA: Insufficient documentation

## 2013-10-01 DIAGNOSIS — K219 Gastro-esophageal reflux disease without esophagitis: Secondary | ICD-10-CM | POA: Diagnosis present

## 2013-10-01 DIAGNOSIS — I2510 Atherosclerotic heart disease of native coronary artery without angina pectoris: Secondary | ICD-10-CM | POA: Insufficient documentation

## 2013-10-01 DIAGNOSIS — G40909 Epilepsy, unspecified, not intractable, without status epilepticus: Secondary | ICD-10-CM | POA: Insufficient documentation

## 2013-10-01 DIAGNOSIS — K5732 Diverticulitis of large intestine without perforation or abscess without bleeding: Secondary | ICD-10-CM | POA: Insufficient documentation

## 2013-10-01 DIAGNOSIS — I509 Heart failure, unspecified: Secondary | ICD-10-CM | POA: Insufficient documentation

## 2013-10-01 DIAGNOSIS — A0472 Enterocolitis due to Clostridium difficile, not specified as recurrent: Secondary | ICD-10-CM | POA: Diagnosis present

## 2013-10-01 DIAGNOSIS — M545 Low back pain, unspecified: Secondary | ICD-10-CM | POA: Insufficient documentation

## 2013-10-01 DIAGNOSIS — I10 Essential (primary) hypertension: Secondary | ICD-10-CM | POA: Diagnosis present

## 2013-10-01 DIAGNOSIS — I200 Unstable angina: Secondary | ICD-10-CM

## 2013-10-01 DIAGNOSIS — J449 Chronic obstructive pulmonary disease, unspecified: Secondary | ICD-10-CM | POA: Diagnosis present

## 2013-10-01 DIAGNOSIS — R1013 Epigastric pain: Secondary | ICD-10-CM | POA: Diagnosis present

## 2013-10-01 DIAGNOSIS — F329 Major depressive disorder, single episode, unspecified: Secondary | ICD-10-CM | POA: Insufficient documentation

## 2013-10-01 DIAGNOSIS — G4730 Sleep apnea, unspecified: Secondary | ICD-10-CM | POA: Insufficient documentation

## 2013-10-01 DIAGNOSIS — I219 Acute myocardial infarction, unspecified: Secondary | ICD-10-CM | POA: Insufficient documentation

## 2013-10-01 DIAGNOSIS — I209 Angina pectoris, unspecified: Secondary | ICD-10-CM | POA: Diagnosis present

## 2013-10-01 DIAGNOSIS — J4489 Other specified chronic obstructive pulmonary disease: Secondary | ICD-10-CM | POA: Insufficient documentation

## 2013-10-01 DIAGNOSIS — N200 Calculus of kidney: Secondary | ICD-10-CM | POA: Insufficient documentation

## 2013-10-01 DIAGNOSIS — Z951 Presence of aortocoronary bypass graft: Secondary | ICD-10-CM | POA: Diagnosis present

## 2013-10-01 DIAGNOSIS — R197 Diarrhea, unspecified: Secondary | ICD-10-CM | POA: Diagnosis present

## 2013-10-01 DIAGNOSIS — F3289 Other specified depressive episodes: Secondary | ICD-10-CM | POA: Insufficient documentation

## 2013-10-01 DIAGNOSIS — R079 Chest pain, unspecified: Secondary | ICD-10-CM

## 2013-10-01 DIAGNOSIS — R569 Unspecified convulsions: Secondary | ICD-10-CM | POA: Insufficient documentation

## 2013-10-01 DIAGNOSIS — R0602 Shortness of breath: Secondary | ICD-10-CM | POA: Insufficient documentation

## 2013-10-01 HISTORY — DX: Unspecified osteoarthritis, unspecified site: M19.90

## 2013-10-01 HISTORY — DX: Unspecified convulsions: R56.9

## 2013-10-01 HISTORY — DX: Anxiety disorder, unspecified: F41.9

## 2013-10-01 HISTORY — DX: Depression, unspecified: F32.A

## 2013-10-01 HISTORY — DX: Major depressive disorder, single episode, unspecified: F32.9

## 2013-10-01 LAB — BASIC METABOLIC PANEL
BUN: 17 mg/dL (ref 6–23)
CO2: 25 mEq/L (ref 19–32)
Calcium: 9.4 mg/dL (ref 8.4–10.5)
Chloride: 99 mEq/L (ref 96–112)
Creatinine, Ser: 0.87 mg/dL (ref 0.50–1.10)
GFR calc Af Amer: 84 mL/min — ABNORMAL LOW (ref >90)
GFR calc non Af Amer: 73 mL/min — ABNORMAL LOW (ref >90)
Glucose, Bld: 116 mg/dL — ABNORMAL HIGH (ref 70–99)
Potassium: 3.1 mEq/L — ABNORMAL LOW (ref 3.5–5.1)
Sodium: 138 mEq/L (ref 135–145)

## 2013-10-01 LAB — CBC WITH DIFFERENTIAL/PLATELET
Basophils Absolute: 0 10*3/uL (ref 0.0–0.1)
Basophils Relative: 0 % (ref 0–1)
Eosinophils Absolute: 0 10*3/uL (ref 0.0–0.7)
Eosinophils Relative: 0 % (ref 0–5)
HCT: 36.9 % (ref 36.0–46.0)
Hemoglobin: 12.1 g/dL (ref 12.0–15.0)
Lymphocytes Relative: 21 % (ref 12–46)
Lymphs Abs: 2.2 10*3/uL (ref 0.7–4.0)
MCH: 30 pg (ref 26.0–34.0)
MCHC: 32.8 g/dL (ref 30.0–36.0)
MCV: 91.6 fL (ref 78.0–100.0)
Monocytes Absolute: 1.1 10*3/uL — ABNORMAL HIGH (ref 0.1–1.0)
Monocytes Relative: 10 % (ref 3–12)
Neutro Abs: 7.5 10*3/uL (ref 1.7–7.7)
Neutrophils Relative %: 69 % (ref 43–77)
Platelets: 299 10*3/uL (ref 150–400)
RBC: 4.03 MIL/uL (ref 3.87–5.11)
RDW: 13.6 % (ref 11.5–15.5)
WBC: 10.9 10*3/uL — ABNORMAL HIGH (ref 4.0–10.5)

## 2013-10-01 LAB — PRO B NATRIURETIC PEPTIDE: Pro B Natriuretic peptide (BNP): 1027 pg/mL — ABNORMAL HIGH (ref 0–125)

## 2013-10-01 LAB — HEPARIN LEVEL (UNFRACTIONATED): Heparin Unfractionated: 0.17 IU/mL — ABNORMAL LOW (ref 0.30–0.70)

## 2013-10-01 LAB — TROPONIN I: Troponin I: <0.3 ng/mL (ref <0.30)

## 2013-10-01 MED ORDER — ADULT MULTIVITAMIN W/MINERALS CH
1.0000 | ORAL_TABLET | Freq: Two times a day (BID) | ORAL | Status: DC
  Administered 2013-10-01 – 2013-10-04 (×6): 1 via ORAL
  Filled 2013-10-01 (×7): qty 1

## 2013-10-01 MED ORDER — HYDROCODONE-ACETAMINOPHEN 10-325 MG PO TABS
1.0000 | ORAL_TABLET | Freq: Four times a day (QID) | ORAL | Status: DC | PRN
  Administered 2013-10-02 – 2013-10-04 (×5): 1 via ORAL
  Filled 2013-10-01 (×5): qty 1

## 2013-10-01 MED ORDER — NON FORMULARY: 5.0000 mg | Freq: Every day | Status: DC

## 2013-10-01 MED ORDER — HEPARIN BOLUS VIA INFUSION
4000.0000 [IU] | Freq: Once | INTRAVENOUS | Status: AC
  Administered 2013-10-01: 4000 [IU] via INTRAVENOUS

## 2013-10-01 MED ORDER — NITROGLYCERIN 0.4 MG SL SUBL: 0.4000 mg | SUBLINGUAL_TABLET | SUBLINGUAL | Status: DC | PRN

## 2013-10-01 MED ORDER — ALPRAZOLAM 0.5 MG PO TABS
1.0000 mg | ORAL_TABLET | Freq: Three times a day (TID) | ORAL | Status: DC | PRN
  Administered 2013-10-02 – 2013-10-04 (×4): 1 mg via ORAL
  Filled 2013-10-01 (×4): qty 2

## 2013-10-01 MED ORDER — NITROGLYCERIN IN D5W 200-5 MCG/ML-% IV SOLN
5.0000 ug/min | Freq: Once | INTRAVENOUS | Status: AC
  Administered 2013-10-01: 5 ug/min via INTRAVENOUS
  Filled 2013-10-01: qty 250

## 2013-10-01 MED ORDER — ASPIRIN EC 81 MG PO TBEC
81.0000 mg | DELAYED_RELEASE_TABLET | Freq: Every day | ORAL | Status: DC
  Administered 2013-10-02 – 2013-10-04 (×3): 81 mg via ORAL
  Filled 2013-10-01 (×3): qty 1

## 2013-10-01 MED ORDER — HEPARIN BOLUS VIA INFUSION
2500.0000 [IU] | Freq: Once | INTRAVENOUS | Status: AC
  Administered 2013-10-01: 2500 [IU] via INTRAVENOUS
  Filled 2013-10-01: qty 2500

## 2013-10-01 MED ORDER — PRASUGREL HCL 10 MG PO TABS
10.0000 mg | ORAL_TABLET | Freq: Every day | ORAL | Status: DC
  Administered 2013-10-02 – 2013-10-04 (×3): 10 mg via ORAL
  Filled 2013-10-01 (×3): qty 1

## 2013-10-01 MED ORDER — ALBUTEROL SULFATE HFA 108 (90 BASE) MCG/ACT IN AERS
2.0000 | INHALATION_SPRAY | Freq: Four times a day (QID) | RESPIRATORY_TRACT | Status: DC | PRN
  Filled 2013-10-01: qty 6.7

## 2013-10-01 MED ORDER — FLUOXETINE HCL 20 MG PO TABS
20.0000 mg | ORAL_TABLET | Freq: Every morning | ORAL | Status: DC
  Administered 2013-10-02 – 2013-10-04 (×3): 20 mg via ORAL
  Filled 2013-10-01 (×3): qty 1

## 2013-10-01 MED ORDER — ONDANSETRON HCL 4 MG/2ML IJ SOLN
4.0000 mg | Freq: Once | INTRAMUSCULAR | Status: AC
  Administered 2013-10-01: 4 mg via INTRAMUSCULAR
  Filled 2013-10-01: qty 2

## 2013-10-01 MED ORDER — MORPHINE SULFATE 4 MG/ML IJ SOLN
4.0000 mg | Freq: Once | INTRAMUSCULAR | Status: AC
  Administered 2013-10-01: 4 mg via INTRAVENOUS
  Filled 2013-10-01: qty 1

## 2013-10-01 MED ORDER — AMLODIPINE BESYLATE 5 MG PO TABS
5.0000 mg | ORAL_TABLET | Freq: Every day | ORAL | Status: DC
  Administered 2013-10-02 – 2013-10-04 (×3): 5 mg via ORAL
  Filled 2013-10-01 (×3): qty 1

## 2013-10-01 MED ORDER — METOPROLOL SUCCINATE ER 50 MG PO TB24
50.0000 mg | ORAL_TABLET | Freq: Every morning | ORAL | Status: DC
  Administered 2013-10-02 – 2013-10-04 (×3): 50 mg via ORAL
  Filled 2013-10-01 (×5): qty 1

## 2013-10-01 MED ORDER — PANTOPRAZOLE SODIUM 40 MG PO TBEC
40.0000 mg | DELAYED_RELEASE_TABLET | Freq: Every day | ORAL | Status: DC
  Administered 2013-10-01 – 2013-10-04 (×4): 40 mg via ORAL
  Filled 2013-10-01 (×3): qty 1

## 2013-10-01 MED ORDER — ROSUVASTATIN CALCIUM 5 MG PO TABS
5.0000 mg | ORAL_TABLET | Freq: Every day | ORAL | Status: DC
  Administered 2013-10-02 – 2013-10-04 (×3): 5 mg via ORAL
  Filled 2013-10-01 (×3): qty 1

## 2013-10-01 MED ORDER — ONDANSETRON HCL 4 MG/2ML IJ SOLN
4.0000 mg | Freq: Four times a day (QID) | INTRAMUSCULAR | Status: DC | PRN
  Administered 2013-10-01: 4 mg via INTRAVENOUS
  Filled 2013-10-01: qty 2

## 2013-10-01 MED ORDER — HEPARIN (PORCINE) IN NACL 100-0.45 UNIT/ML-% IJ SOLN
1100.0000 [IU]/h | INTRAMUSCULAR | Status: DC
  Administered 2013-10-01: 1100 [IU]/h via INTRAVENOUS
  Filled 2013-10-01: qty 250

## 2013-10-01 MED ORDER — SODIUM CHLORIDE 0.9 % IV SOLN: INTRAVENOUS | Status: DC

## 2013-10-01 MED ORDER — BENAZEPRIL HCL 20 MG PO TABS
20.0000 mg | ORAL_TABLET | Freq: Every day | ORAL | Status: DC
  Administered 2013-10-02 – 2013-10-04 (×3): 20 mg via ORAL
  Filled 2013-10-01 (×3): qty 1

## 2013-10-01 MED ORDER — LEVOTHYROXINE SODIUM 75 MCG PO TABS
75.0000 ug | ORAL_TABLET | Freq: Every day | ORAL | Status: DC
  Administered 2013-10-02 – 2013-10-04 (×3): 75 ug via ORAL
  Filled 2013-10-01 (×5): qty 1

## 2013-10-01 MED ORDER — HEPARIN (PORCINE) IN NACL 100-0.45 UNIT/ML-% IJ SOLN
1350.0000 [IU]/h | INTRAMUSCULAR | Status: DC
  Administered 2013-10-01 – 2013-10-02 (×2): 1350 [IU]/h via INTRAVENOUS
  Filled 2013-10-01 (×3): qty 250

## 2013-10-01 MED ORDER — ACETAMINOPHEN 325 MG PO TABS: 650.0000 mg | ORAL_TABLET | ORAL | Status: DC | PRN

## 2013-10-01 MED ORDER — LORAZEPAM 2 MG/ML IJ SOLN
1.0000 mg | Freq: Once | INTRAMUSCULAR | Status: AC
  Administered 2013-10-01: 1 mg via INTRAVENOUS
  Filled 2013-10-01: qty 1

## 2013-10-01 MED ORDER — POTASSIUM CHLORIDE CRYS ER 20 MEQ PO TBCR
20.0000 meq | EXTENDED_RELEASE_TABLET | Freq: Every day | ORAL | Status: DC
  Administered 2013-10-01 – 2013-10-04 (×4): 20 meq via ORAL
  Filled 2013-10-01 (×5): qty 1

## 2013-10-01 NOTE — ED Notes (Signed)
Report given to [facility], ETA 20 minutes

## 2013-10-01 NOTE — ED Notes (Signed)
Pt co chest pain, central with radiation to jaw, some nausea. Pt has cardiac hx with quad bypass in 2004.
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
## 2013-10-01 NOTE — ED Notes (Signed)
EMS gave nitro x2 with change in pain from 7/10 to 5/10, pt has 22g IV in LFA.

## 2013-10-01 NOTE — Progress Notes (Signed)
ANTICOAGULATION CONSULT NOTE - Initial Consult  Pharmacy Consult for Heparin Indication: chest pain/ACS  Allergies  Allergen Reactions  . Lipitor [Atorvastatin] Other (See Comments)    Cramps  . Morphine And Related Itching    Patient Measurements: Height: 5\' 3"  (160 cm) Weight: 230 lb (104.327 kg) IBW/kg (Calculated) : 52.4 Heparin Dosing Weight: 77.1 kg  Vital Signs: Temp: 98.4 F (36.9 C) (12/28 1330) Temp src: Oral (12/28 1330) BP: 121/76 mmHg (12/28 1330) Pulse Rate: 72 (12/28 1330)  Labs: No results found for this basename: HGB, HCT, PLT, APTT, LABPROT, INR, HEPARINUNFRC, CREATININE, CKTOTAL, CKMB, TROPONINI,  in the last 72 hours  Estimated Creatinine Clearance: 64.6 ml/min (by C-G formula based on Cr of 1.11).   Medical History: Past Medical History  Diagnosis Date  . Coronary artery disease   . Hypertension   . COPD (chronic obstructive pulmonary disease)   . Hyperlipidemia   . Anxiety and depression   . GERD (gastroesophageal reflux disease)   . Seizure disorder   . Chronic low back pain   . Diverticulitis   . Kidney stones   . Diverticulitis     4 documented episodes by CT; most recent April 2012  . MI (myocardial infarction) Nov 2011    also 2005  . Sleep apnea     has been diagnosed but didn't follow up to get CPAP  . CHF (congestive heart failure)     Medications:  Scheduled:  . LORazepam  1 mg Intravenous Once  . morphine  4 mg Intravenous Once  . nitroGLYCERIN  5-200 mcg/min Intravenous Once  . ondansetron  4 mg Intramuscular Once    Assessment: Obese patient Labs pending  Goal of Therapy:  Heparin level 0.3-0.7 units/ml Monitor platelets by anticoagulation protocol: Yes   Plan:  Give 4000 units bolus x 1 Start heparin infusion at 1100 units/hr Check anti-Xa level in 6 hours and daily while on heparin Continue to monitor H&H and platelets  Raquel James, Rahul Malinak Bennett 10/01/2013,2:11 PM

## 2013-10-01 NOTE — ED Notes (Signed)
Pt denies chest pain at this time.

## 2013-10-01 NOTE — Progress Notes (Signed)
ANTICOAGULATION CONSULT NOTE - Follow-Up Consult  Pharmacy Consult for Heparin Indication: chest pain/ACS  Allergies  Allergen Reactions  . Lipitor [Atorvastatin] Other (See Comments)    Cramps  . Morphine And Related Itching    Patient Measurements: Height: 5\' 3"  (160 cm) Weight: 228 lb 9.9 oz (103.7 kg) IBW/kg (Calculated) : 52.4 Heparin Dosing Weight: 77.1 kg  Vital Signs: Temp: 98.7 F (37.1 C) (12/28 2117) Temp src: Oral (12/28 2117) BP: 141/63 mmHg (12/28 2300) Pulse Rate: 53 (12/28 2117)  Labs:  Recent Labs  10/01/13 1411 10/01/13 2230  HGB 12.1  --   HCT 36.9  --   PLT 299  --   HEPARINUNFRC  --  0.17*  CREATININE 0.87  --   TROPONINI <0.30 <0.30    Estimated Creatinine Clearance: 82.1 ml/min (by C-G formula based on Cr of 0.87).   Medical History: Past Medical History  Diagnosis Date  . Coronary artery disease   . Hypertension   . COPD (chronic obstructive pulmonary disease)   . Hyperlipidemia   . Anxiety and depression   . GERD (gastroesophageal reflux disease)   . Seizure disorder   . Chronic low back pain   . Diverticulitis   . Kidney stones   . Diverticulitis     4 documented episodes by CT; most recent April 2012  . MI (myocardial infarction) Nov 2011    also 2005  . Sleep apnea     has been diagnosed but didn't follow up to get CPAP  . CHF (congestive heart failure)   . Depression   . Anxiety   . Shortness of breath   . Pneumonia   . Seizures   . Arthritis     Medications:  Scheduled:  . [START ON 10/02/2013] amLODipine  5 mg Oral Daily  . [START ON 10/02/2013] aspirin EC  81 mg Oral Daily  . [START ON 10/02/2013] benazepril  20 mg Oral Daily  . [START ON 10/02/2013] FLUoxetine  20 mg Oral q morning - 10a  . [START ON 10/02/2013] levothyroxine  75 mcg Oral QAC breakfast  . [START ON 10/02/2013] metoprolol succinate  50 mg Oral q morning - 10a  . multivitamin with minerals  1 tablet Oral BID  . pantoprazole  40 mg Oral Daily   . potassium chloride SA  20 mEq Oral Daily  . [START ON 10/02/2013] prasugrel  10 mg Oral Daily  . [START ON 10/02/2013] rosuvastatin  5 mg Oral Daily   . sodium chloride 10 mL (10/01/13 2310)  . heparin 1,100 Units/hr (10/01/13 2309)     Assessment: 57 yo female with hx CAD, admitted with chest pain and started on IV heparin at 1100 units/hr.  Initial heparin level low at 0.17.  Spoke to RN, no issues with IV.  No bleeding or complications noted per chart notes.  Cardiology consult pending.  Goal of Therapy:  Heparin level 0.3-0.7 units/ml Monitor platelets by anticoagulation protocol: Yes   Plan:  1. Rebolus with small bolus of 2500 units x 1 2. Then increase IV heparin to 1350 units/hr. 3. Recheck heparin level in 6 hrs. 4. Daily heparin level and CBC.  Tad Moore, BCPS  Clinical Pharmacist Pager [phone]  10/01/2013 11:37 PM

## 2013-10-02 DIAGNOSIS — J449 Chronic obstructive pulmonary disease, unspecified: Secondary | ICD-10-CM

## 2013-10-02 DIAGNOSIS — R197 Diarrhea, unspecified: Secondary | ICD-10-CM

## 2013-10-02 DIAGNOSIS — I200 Unstable angina: Secondary | ICD-10-CM

## 2013-10-02 DIAGNOSIS — R1013 Epigastric pain: Secondary | ICD-10-CM

## 2013-10-02 LAB — BASIC METABOLIC PANEL
BUN: 23 mg/dL (ref 6–23)
CO2: 26 mEq/L (ref 19–32)
Calcium: 8.4 mg/dL (ref 8.4–10.5)
Chloride: 102 mEq/L (ref 96–112)
Creatinine, Ser: 0.84 mg/dL (ref 0.50–1.10)
GFR calc Af Amer: 88 mL/min — ABNORMAL LOW (ref >90)

## 2013-10-02 LAB — CBC
HCT: 34.7 % — ABNORMAL LOW (ref 36.0–46.0)
MCH: 30 pg (ref 26.0–34.0)
MCHC: 32 g/dL (ref 30.0–36.0)
MCV: 93.8 fL (ref 78.0–100.0)
Platelets: 243 10*3/uL (ref 150–400)
RBC: 3.7 MIL/uL — ABNORMAL LOW (ref 3.87–5.11)
RDW: 14 % (ref 11.5–15.5)
WBC: 9.7 10*3/uL (ref 4.0–10.5)

## 2013-10-02 LAB — TSH: TSH: 6.835 u[IU]/mL — ABNORMAL HIGH (ref 0.350–4.500)

## 2013-10-02 LAB — RESPIRATORY VIRUS PANEL
Influenza A: NOT DETECTED
Influenza B: NOT DETECTED
Metapneumovirus: NOT DETECTED
Parainfluenza 1: NOT DETECTED
Parainfluenza 2: NOT DETECTED
Parainfluenza 3: NOT DETECTED
Respiratory Syncytial Virus A: NOT DETECTED
Rhinovirus: NOT DETECTED

## 2013-10-02 LAB — LIPID PANEL
LDL Cholesterol: UNDETERMINED mg/dL (ref 0–99)
Total CHOL/HDL Ratio: 8.2 RATIO
VLDL: UNDETERMINED mg/dL (ref 0–40)

## 2013-10-02 LAB — HEMOGLOBIN A1C
Hgb A1c MFr Bld: 6.3 % — ABNORMAL HIGH (ref <5.7)
Mean Plasma Glucose: 134 mg/dL — ABNORMAL HIGH (ref <117)

## 2013-10-02 LAB — INFLUENZA PANEL BY PCR (TYPE A & B): Influenza A By PCR: NEGATIVE

## 2013-10-02 LAB — TROPONIN I: Troponin I: <0.3 ng/mL (ref <0.30)

## 2013-10-02 LAB — HEPARIN LEVEL (UNFRACTIONATED): Heparin Unfractionated: 0.38 IU/mL (ref 0.30–0.70)

## 2013-10-02 MED ORDER — POTASSIUM CHLORIDE CRYS ER 20 MEQ PO TBCR
40.0000 meq | EXTENDED_RELEASE_TABLET | Freq: Once | ORAL | Status: AC
  Administered 2013-10-02: 40 meq via ORAL

## 2013-10-02 MED ORDER — POTASSIUM CHLORIDE CRYS ER 20 MEQ PO TBCR: 40.0000 meq | EXTENDED_RELEASE_TABLET | Freq: Every day | ORAL | Status: DC
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
## 2013-10-02 NOTE — Progress Notes (Signed)
ANTICOAGULATION CONSULT NOTE - Follow-Up Consult  Pharmacy Consult for Heparin Indication: chest pain/ACS  Allergies  Allergen Reactions  . Lipitor [Atorvastatin] Other (See Comments)    Cramps  . Morphine And Related Itching    Patient Measurements: Height: 5\' 3"  (160 cm) Weight: 228 lb 9.9 oz (103.7 kg) IBW/kg (Calculated) : 52.4 Heparin Dosing Weight: 77.1 kg  Vital Signs: Temp: 98.1 F (36.7 C) (12/29 0400) Temp src: Oral (12/29 0400) BP: 140/59 mmHg (12/29 0300) Pulse Rate: 53 (12/28 2117)  Labs:  Recent Labs  10/01/13 1411 10/01/13 2230 10/02/13 0423  HGB 12.1  --  11.1*  HCT 36.9  --  34.7*  PLT 299  --  243  HEPARINUNFRC  --  0.17* 0.38  CREATININE 0.87  --   --   TROPONINI <0.30 <0.30  --     Estimated Creatinine Clearance: 82.1 ml/min (by C-G formula based on Cr of 0.87).   Medical History: Past Medical History  Diagnosis Date  . Coronary artery disease   . Hypertension   . COPD (chronic obstructive pulmonary disease)   . Hyperlipidemia   . Anxiety and depression   . GERD (gastroesophageal reflux disease)   . Seizure disorder   . Chronic low back pain   . Diverticulitis   . Kidney stones   . Diverticulitis     4 documented episodes by CT; most recent April 2012  . MI (myocardial infarction) Nov 2011    also 2005  . Sleep apnea     has been diagnosed but didn't follow up to get CPAP  . CHF (congestive heart failure)   . Depression   . Anxiety   . Shortness of breath   . Pneumonia   . Seizures   . Arthritis     Medications:  Scheduled:  . amLODipine  5 mg Oral Daily  . aspirin EC  81 mg Oral Daily  . benazepril  20 mg Oral Daily  . FLUoxetine  20 mg Oral q morning - 10a  . levothyroxine  75 mcg Oral QAC breakfast  . metoprolol succinate  50 mg Oral q morning - 10a  . multivitamin with minerals  1 tablet Oral BID  . pantoprazole  40 mg Oral Daily  . potassium chloride SA  20 mEq Oral Daily  . prasugrel  10 mg Oral Daily  .  rosuvastatin  5 mg Oral Daily   . sodium chloride 10 mL (10/01/13 2310)  . heparin 1,350 Units/hr (10/02/13 0403)     Assessment: 57 yo female with hx CAD, admitted with chest pain and started on IV heparin at 1100 units/hr.  Initial heparin level low at 0.17.  Spoke to RN, no issues with IV.  No bleeding or complications noted per chart notes.  Cardiology consult pending.  Repeat heparin level now therapeutic on 1350 units/hr.    Goal of Therapy:  Heparin level 0.3-0.7 units/ml Monitor platelets by anticoagulation protocol: Yes   Plan:  1. Continue IV heparin at current rate. 3. Recheck heparin level in 6 hrs to confirm. 4. Daily heparin level and CBC.  Tad Moore, BCPS  Clinical Pharmacist Pager [phone]  10/02/2013 5:56 AM

## 2013-10-02 NOTE — Progress Notes (Signed)
INITIAL NUTRITION ASSESSMENT  DOCUMENTATION CODES Per approved criteria  -Morbid Obesity   INTERVENTION: 1.  Modify diet; resume PO diet once medically appropriate per MD discretion.  Consider supplements based on adequacy of intake.   NUTRITION DIAGNOSIS: Inadequate oral intake related to poor appetite as evidenced by pt report.   Monitor:  1.  Food/Beverage; diet advancement with pt meeting >/=90% estimated needs with tolerance. 2.  Wt/wt change; monitor trends  Reason for Assessment: MST  57 y.o. female  Admitting Dx: Unstable angina  ASSESSMENT: Pt admitted with chest pain.  RD met with pt who reports poor appetite due to acute illness over the past few weeks.  She states she has not been able to eat per her usual.  She reports she has had associated wt loss of 5-7 lbs (2-3% usual wt) which is not clinically significant.  Pt endorses weakness.  She is currently NPO for nuclear test vs. LHC soon.  RD to follow.   Nutrition Focused Physical Exam: Subcutaneous Fat:  Orbital Region: WNL Upper Arm Region: WNL Thoracic and Lumbar Region: not assessed  Muscle:  Temple Region: WNL Clavicle Bone Region: WNL Clavicle and Acromion Bone Region: WNL Scapular Bone Region: not assessed Dorsal Hand: WNL Patellar Region: not assessed Anterior Thigh Region: not assessed Posterior Calf Region: not assessed  Edema: none present  Height: Ht Readings from Last 1 Encounters:  10/01/13 5\' 3"  (1.6 m)    Weight: Wt Readings from Last 1 Encounters:  10/02/13 231 lb 0.7 oz (104.8 kg)    Ideal Body Weight: 115 lbs  % Ideal Body Weight: 200%  Wt Readings from Last 10 Encounters:  10/02/13 231 lb 0.7 oz (104.8 kg)  06/22/13 239 lb 0.6 oz (108.428 kg)  09/08/12 235 lb (106.595 kg)  02/15/12 246 lb (111.585 kg)  12/02/11 237 lb 3.2 oz (107.593 kg)  09/10/11 237 lb 10.5 oz (107.8 kg)  04/16/11 227 lb 6.4 oz (103.148 kg)  03/10/11 229 lb (103.874 kg)  01/15/11 239 lb 12.8 oz  (108.773 kg)  01/07/11 240 lb (108.863 kg)    Usual Body Weight: 235-240 lbs per pt  % Usual Body Weight: 97%  BMI:  Body mass index is 40.94 kg/(m^2).  Estimated Nutritional Needs: Kcal: 1880-2100 Protein: 85-100g Fluid: >1.8 L/day  Skin: intact  Diet Order: NPO  EDUCATION NEEDS: -No education needs identified at this time   Intake/Output Summary (Last 24 hours) at 10/02/13 1302 Last data filed at 10/02/13 1200  Gross per 24 hour  Intake  521.5 ml  Output    400 ml  Net  121.5 ml    Last BM: 12/29  Labs:   Recent Labs Lab 10/01/13 1411 10/01/13 2230 10/02/13 0423  NA 138  --  138  K 3.1*  --  2.9*  CL 99  --  102  CO2 25  --  26  BUN 17  --  23  CREATININE 0.87  --  0.84  CALCIUM 9.4  --  8.4  MG  --  2.2  --   GLUCOSE 116*  --  103*    CBG (last 3)  No results found for this basename: GLUCAP,  in the last 72 hours  Scheduled Meds: . amLODipine  5 mg Oral Daily  . aspirin EC  81 mg Oral Daily  . benazepril  20 mg Oral Daily  . FLUoxetine  20 mg Oral q morning - 10a  . levothyroxine  75 mcg Oral QAC breakfast  .  metoprolol succinate  50 mg Oral q morning - 10a  . multivitamin with minerals  1 tablet Oral BID  . pantoprazole  40 mg Oral Daily  . potassium chloride SA  20 mEq Oral Daily  . prasugrel  10 mg Oral Daily  . rosuvastatin  5 mg Oral Daily    Continuous Infusions: . sodium chloride 10 mL (10/01/13 2310)  . heparin 1,350 Units/hr (10/02/13 0754)    Past Medical History  Diagnosis Date  . Coronary artery disease   . Hypertension   . COPD (chronic obstructive pulmonary disease)   . Hyperlipidemia   . Anxiety and depression   . GERD (gastroesophageal reflux disease)   . Seizure disorder   . Chronic low back pain   . Diverticulitis   . Kidney stones   . Diverticulitis     4 documented episodes by CT; most recent April 2012  . MI (myocardial infarction) Nov 2011    also 2005  . Sleep apnea     has been diagnosed but didn't  follow up to get CPAP  . CHF (congestive heart failure)   . Depression   . Anxiety   . Shortness of breath   . Pneumonia   . Seizures   . Arthritis     Past Surgical History  Procedure Laterality Date  . Coronary artery bypass graft  2005    LIMA to AL, SVG to LAD, SVG to Cx, SVG-PDA  . Carotid endarterectomy  2005  . Left knee repair    . Right shoulder and elbow repair    . Abdominal hysterectomy    . Coronary angioplasty with stent placement  2011  . Cardiac catheterization  Jan 2012    patent stents in RCA graft; CAD stable    Loyce Dys, MS RD LDN Clinical Inpatient Dietitian Pager: [phone] Weekend/After hours pager: [phone]

## 2013-10-02 NOTE — Progress Notes (Signed)
UR completed 

## 2013-10-03 ENCOUNTER — Observation Stay (HOSPITAL_COMMUNITY): Payer: Medicaid Other

## 2013-10-03 ENCOUNTER — Encounter: Payer: Medicaid Other | Admitting: Adult Health

## 2013-10-03 ENCOUNTER — Encounter: Payer: Self-pay | Admitting: *Deleted

## 2013-10-03 DIAGNOSIS — A0472 Enterocolitis due to Clostridium difficile, not specified as recurrent: Secondary | ICD-10-CM | POA: Diagnosis present

## 2013-10-03 DIAGNOSIS — R079 Chest pain, unspecified: Secondary | ICD-10-CM

## 2013-10-03 LAB — CBC
HCT: 37 % (ref 36.0–46.0)
MCH: 30.2 pg (ref 26.0–34.0)
MCHC: 32.2 g/dL (ref 30.0–36.0)
MCV: 93.9 fL (ref 78.0–100.0)
RDW: 13.7 % (ref 11.5–15.5)
WBC: 9.9 10*3/uL (ref 4.0–10.5)

## 2013-10-03 LAB — BASIC METABOLIC PANEL
BUN: 16 mg/dL (ref 6–23)
Calcium: 9 mg/dL (ref 8.4–10.5)
Creatinine, Ser: 0.68 mg/dL (ref 0.50–1.10)
GFR calc Af Amer: >90 mL/min (ref >90)
GFR calc non Af Amer: >90 mL/min (ref >90)
Glucose, Bld: 110 mg/dL — ABNORMAL HIGH (ref 70–99)
Potassium: 4 mEq/L (ref 3.7–5.3)

## 2013-10-03 LAB — CLOSTRIDIUM DIFFICILE BY PCR: Toxigenic C. Difficile by PCR: POSITIVE — AB

## 2013-10-03 MED ORDER — REGADENOSON 0.4 MG/5ML IV SOLN
INTRAVENOUS | Status: AC
  Administered 2013-10-03: 0.4 mg via INTRAVENOUS
  Filled 2013-10-03: qty 5

## 2013-10-03 MED ORDER — REGADENOSON 0.4 MG/5ML IV SOLN
0.4000 mg | Freq: Once | INTRAVENOUS | Status: AC
  Administered 2013-10-03: 0.4 mg via INTRAVENOUS
  Filled 2013-10-03: qty 5

## 2013-10-03 MED ORDER — TECHNETIUM TC 99M SESTAMIBI GENERIC - CARDIOLITE
30.0000 | Freq: Once | INTRAVENOUS | Status: AC | PRN
  Administered 2013-10-03: 30 via INTRAVENOUS

## 2013-10-03 MED ORDER — TECHNETIUM TC 99M SESTAMIBI GENERIC - CARDIOLITE
10.0000 | Freq: Once | INTRAVENOUS | Status: AC | PRN
  Administered 2013-10-03: 10 via INTRAVENOUS

## 2013-10-03 MED ORDER — METRONIDAZOLE 500 MG PO TABS
500.0000 mg | ORAL_TABLET | Freq: Three times a day (TID) | ORAL | Status: DC
  Administered 2013-10-03 – 2013-10-04 (×3): 500 mg via ORAL
  Filled 2013-10-03 (×7): qty 1

## 2013-10-03 MED ORDER — SODIUM CHLORIDE 0.9 % IJ SOLN
80.0000 mg | INTRAVENOUS | Status: AC
  Administered 2013-10-03: 80 mg via INTRAVENOUS

## 2013-10-03 NOTE — Progress Notes (Signed)
Patient ID: [name], female   DOB: Jan 17, 1956, 57 y.o.   MRN: [id number] Error patient in the hospital
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
## 2013-10-04 ENCOUNTER — Encounter (HOSPITAL_COMMUNITY): Payer: Self-pay | Admitting: *Deleted

## 2013-10-04 DIAGNOSIS — E785 Hyperlipidemia, unspecified: Secondary | ICD-10-CM

## 2013-10-04 DIAGNOSIS — A0472 Enterocolitis due to Clostridium difficile, not specified as recurrent: Secondary | ICD-10-CM

## 2013-10-04 LAB — CBC
HCT: 33.9 % — ABNORMAL LOW (ref 36.0–46.0)
Hemoglobin: 11 g/dL — ABNORMAL LOW (ref 12.0–15.0)
MCH: 30.2 pg (ref 26.0–34.0)
MCV: 93.1 fL (ref 78.0–100.0)
RBC: 3.64 MIL/uL — ABNORMAL LOW (ref 3.87–5.11)
RDW: 13.7 % (ref 11.5–15.5)
WBC: 10 10*3/uL (ref 4.0–10.5)

## 2013-10-04 MED ORDER — ASPIRIN 81 MG PO TABS: 81.0000 mg | ORAL_TABLET | Freq: Every morning | ORAL | Status: DC

## 2013-10-04 MED ORDER — METRONIDAZOLE 500 MG PO TABS: 500.0000 mg | ORAL_TABLET | Freq: Three times a day (TID) | ORAL | Status: DC
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
## 2013-10-04 NOTE — Discharge Summary (Signed)
Discharge Summary   Patient ID: [name],  MRN: [id number], DOB/AGE: [birth year] 57 y.o.  Admit date: 10/01/2013 Discharge date: 10/04/2013  Primary Care Provider: Fredirick Maudlin Primary Cardiologist: [name], MD   Discharge Diagnoses Principal Problem:   Unstable angina  **Low-risk Cardiolite this admission.  Active Problems:     C. difficile colitis  **Flagyl therapy initiated this admission.   CORONARY ATHEROSCLEROSIS NATIVE CORONARY ARTERY s/p prior CABG   DIARRHEA, CHRONIC   Epigastric pain   HYPERTENSION   COPD   GERD   DYSLIPIDEMIA   Anemia  Allergies Allergies  Allergen Reactions  . Lipitor [Atorvastatin] Other (See Comments)    Cramps  . Morphine And Related Itching   Procedures  Lexiscan Cardiolite 12.30.2014  IMPRESSION: 1) Large inferolateral wall infarct from apex to base no ischemia 2) EF calculated at 50% but may be lower given extent of infarct Suggest echo correlation _____________   History of Present Illness  57 year old female with prior history of coronary artery disease status post coronary artery bypass grafting, hypertension, hyperlipidemia, and GERD who was in her usual state of health until several days prior to admission when she began to experience generalized malaise and weakness. On the evening prior to admission, she experienced substernal chest discomfort with radiation to the left jaw and shoulder, associated with nausea and diaphoresis, while trying to take a shower. She took sublingual nitroglycerin and aspirin without improvement and subsequently called EMS. She was taken to the [facility] emergency department where she continued to report chest pain was placed on IV heparin. ECG was concerning for anterolateral ischemia and she was subsequently transferred to [facility] for further evaluation. Initial troponin was normal.  Hospital Course  Patient ruled out for myocardial infarction. Following admission, she  reported diarrhea and her stool was sent off and returned positive for C. difficile. As result, she was placed on contact isolation and Flagyl therapy was initiated as well. Flu screen was also sent off and returned normal. From a cardiac standpoint, she had no further chest pain. Decision was made to pursue pharmacologic stress testing and this was performed on December 29, revealing large inferolateral wall infarct from apex to base without active ischemia.  EF was 50%.  Prior catheterization films were reviewed it was noted that the left circumflex was occluded and filled by collaterals from the LAD and thus continued medical therapy was recommended.    The patient had no further chest discomfort. Diarrhea also improved and she'll be discharged home today in good condition.    Discharge Vitals Blood pressure 159/76, pulse 57, temperature 98.5 F (36.9 C), temperature source Oral, resp. rate 18, height 5\' 3"  (1.6 m), weight 236 lb 3.2 oz (107.14 kg), SpO2 98.00%.  Filed Weights   10/02/13 0500 10/03/13 0400 10/04/13 0615  Weight: 231 lb 0.7 oz (104.8 kg) 236 lb 8 oz (107.276 kg) 236 lb 3.2 oz (107.14 kg)   Labs  CBC  Recent Labs  10/01/13 1411  10/03/13 0607 10/04/13 0451  WBC 10.9*  < > 9.9 10.0  NEUTROABS 7.5  --   --   --   HGB 12.1  < > 11.9* 11.0*  HCT 36.9  < > 37.0 33.9*  MCV 91.6  < > 93.9 93.1  PLT 299  < > 273 265  < > = values in this interval not displayed.  Basic Metabolic Panel  Recent Labs  10/01/13 2230 10/02/13 0423 10/03/13 0607  NA  --  138  144  K  --  2.9* 4.0  CL  --  102 104  CO2  --  26 27  GLUCOSE  --  103* 110*  BUN  --  23 16  CREATININE  --  0.84 0.68  CALCIUM  --  8.4 9.0  MG 2.2  --   --    ardiac Enzymes  Recent Labs  10/01/13 2230 10/02/13 0423 10/02/13 1130  TROPONINI <0.30 <0.30 <0.30   Hemoglobin A1C  Recent Labs  10/01/13 2230  HGBA1C 6.3*   Fasting Lipid Panel  Recent Labs  10/02/13 0423  CHOL 245*  HDL 30*    LDLCALC UNABLE TO CALCULATE IF TRIGLYCERIDE OVER 400 mg/dL  TRIG 960*  CHOLHDL 8.2   Thyroid Function Tests  Recent Labs  10/01/13 2230  TSH 6.835*   Disposition  Pt is being discharged home today in good condition.  Follow-up Plans & Appointments  Follow-up Information   Follow up with HAWKINS,EDWARD L, MD. (1-2 wks.)    Specialty:  Pulmonary Disease   Contact information:   [address] [place] [address] [phone]       Follow up with Antoine Poche, MD On 11/13/2013. (10:40 AM)    Specialty:  Cardiology   Contact information:   [address] [place] [address] [phone]      Discharge Medications    Medication List         ALPRAZolam 1 MG tablet  Commonly known as:  XANAX  Take 1 mg by mouth 3 (three) times daily as needed for anxiety or sleep.     amLODipine 5 MG tablet  Commonly known as:  NORVASC  Take 1 tablet (5 mg total) by mouth daily.     aspirin 81 MG tablet  Take 1 tablet (81 mg total) by mouth every morning.     benazepril 20 MG tablet  Commonly known as:  LOTENSIN  Take 20 mg by mouth daily.     FLUoxetine 20 MG tablet  Commonly known as:  PROZAC  Take 20 mg by mouth every morning.     furosemide 80 MG tablet  Commonly known as:  LASIX  Take 80 mg by mouth daily as needed for edema.     HYDROcodone-acetaminophen 10-325 MG per tablet  Commonly known as:  NORCO  Take 1 tablet by mouth every 6 (six) hours as needed. Pain     levothyroxine 75 MCG tablet  Commonly known as:  SYNTHROID, LEVOTHROID  Take 75 mcg by mouth every morning.     metoprolol succinate 50 MG 24 hr tablet  Commonly known as:  TOPROL-XL  Take 1 tablet (50 mg total) by mouth every morning. Take with or immediately following a meal.     metroNIDAZOLE 500 MG tablet  Commonly known as:  FLAGYL  Take 1 tablet (500 mg total) by mouth 3 (three) times daily.     multivitamin with minerals Tabs tablet  Take 1 tablet by mouth 2  (two) times daily.     nitroGLYCERIN 0.4 MG SL tablet  Commonly known as:  NITROSTAT  Place 1 tablet (0.4 mg total) under the tongue every 5 (five) minutes as needed. Chest Pain     omeprazole 20 MG capsule  Commonly known as:  PRILOSEC  Take 20 mg by mouth daily.     potassium chloride SA 20 MEQ tablet  Commonly known as:  K-DUR,KLOR-CON  Take 20 mEq by mouth daily as needed (  Only takes when she takes the Furosemide).     prasugrel 10 MG Tabs tablet  Commonly known as:  EFFIENT  Take 10 mg by mouth daily.     PROAIR HFA 108 (90 BASE) MCG/ACT inhaler  Generic drug:  albuterol  Inhale 2 puffs into the lungs every 6 (six) hours as needed for wheezing or shortness of breath.     rosuvastatin 5 MG tablet  Commonly known as:  CRESTOR  Take 5 mg by mouth daily.       Outstanding Labs/Studies  None  Duration of Discharge Encounter   Greater than 30 minutes including physician time.  Signed, Nicolasa Ducking NP 10/04/2013, 9:08 AM

## 2013-10-04 NOTE — Discharge Summary (Signed)
Patient seen and examined and history reviewed. Agree with above findings and plan. See my earlier rounding note.  Theron Arista JordanMD 10/04/2013 1:28 PM

## 2013-10-14 ENCOUNTER — Other Ambulatory Visit: Payer: Self-pay | Admitting: Cardiology

## 2013-10-16 ENCOUNTER — Telehealth: Payer: Self-pay | Admitting: Cardiology

## 2013-10-16 MED ORDER — POTASSIUM CHLORIDE CRYS ER 20 MEQ PO TBCR: 20.0000 meq | EXTENDED_RELEASE_TABLET | Freq: Every day | ORAL | Status: DC | PRN

## 2013-10-16 NOTE — Telephone Encounter (Signed)
Medication sent via escribe.  

## 2013-10-16 NOTE — Telephone Encounter (Signed)
Received fax refill request  Rx # [id number] Medication:  Potassium CL 20 meq tablet Qty 30 Sig:  Take one tablet by mouth each morning Physician:  Daleen SquibbWall

## 2013-10-17 ENCOUNTER — Telehealth: Payer: Self-pay | Admitting: Cardiology

## 2013-10-17 MED ORDER — ROSUVASTATIN CALCIUM 5 MG PO TABS: 5.0000 mg | ORAL_TABLET | Freq: Every day | ORAL | Status: DC

## 2013-10-17 NOTE — Telephone Encounter (Signed)
Received fax refill request  Rx # [id number] Medication:  Lovastatin 40 mg tablets Qty 60 Sig:  Take one tablet by mouth at bedtime Physician:  Daleen SquibbWall

## 2013-10-17 NOTE — Telephone Encounter (Signed)
Received fax refill request  Rx # [id number] Medication:  Crestor 5 mg tablet Qty 30 Sig:  Take one tablet by mouth once daily Physician:  Branch  Ins req prior auth or prefer Lipitor, Pravachol, Zocor or Mevacor  If prior Auth please call [phone]  / ID # [id number] M

## 2013-10-17 NOTE — Telephone Encounter (Signed)
Can pt be changed from crestor  Because of insurance

## 2013-10-18 NOTE — Telephone Encounter (Signed)
This is prior auth for crestor spoke with [facility] will fax form

## 2013-10-18 NOTE — Telephone Encounter (Signed)
crestor is prior auth [facility] to fax form

## 2013-10-18 NOTE — Telephone Encounter (Signed)
Patient requires high dose statin and his allergic to lipitor. She needs crestor, they can fax over form and we can send in  Toni RichJonathan Lakia Gritton MD

## 2013-10-19 ENCOUNTER — Telehealth: Payer: Self-pay | Admitting: Cardiology

## 2013-10-19 NOTE — Telephone Encounter (Signed)
See paper refill in bin / tgs

## 2013-10-19 NOTE — Telephone Encounter (Signed)
Authorization approved for crestor,called to [facility] , lm for pt

## 2013-11-13 ENCOUNTER — Encounter: Payer: Medicaid Other | Admitting: Cardiology

## 2013-11-13 NOTE — Progress Notes (Signed)
Clinical Summary [name] is a 58 y.o.female  1. CAD  - CABG 2005 4 vessel - LVEF by LV gram 40% in Jan 2012, do not see recent echo in our system - history of prior MI, prior stents remotely.  - recent admit to [facility] 09/2013 with generalized fatigue and chest pain. EKG was concerning for anterolateral ischemia, cardiac enzymes were negative. Lexiscan MPI 09/2013 with larger inferolateral scar without active ischemia. LVEF 50%.   2. HTN  3. OSA    Past Medical History  Diagnosis Date  . Coronary artery disease   . Hypertension   . COPD (chronic obstructive pulmonary disease)   . Hyperlipidemia   . GERD (gastroesophageal reflux disease)   . Seizure disorder   . Chronic low back pain   . Kidney stones   . Diverticulitis     4 documented episodes by CT; most recent April 2012  . MI (myocardial infarction) Nov 2011    also 2005  . Sleep apnea     has been diagnosed but didn't follow up to get CPAP  . CHF (congestive heart failure)   . Depression   . Anxiety   . Shortness of breath   . Pneumonia   . Seizures   . Arthritis      Allergies  Allergen Reactions  . Lipitor [Atorvastatin] Other (See Comments)    Cramps  . Morphine And Related Itching     Current Outpatient Prescriptions  Medication Sig Dispense Refill  . albuterol (PROAIR HFA) 108 (90 BASE) MCG/ACT inhaler Inhale 2 puffs into the lungs every 6 (six) hours as needed for wheezing or shortness of breath.      . ALPRAZolam (XANAX) 1 MG tablet Take 1 mg by mouth 3 (three) times daily as needed for anxiety or sleep.       Marland Kitchen. amLODipine (NORVASC) 5 MG tablet Take 1 tablet (5 mg total) by mouth daily.  90 tablet  1  . aspirin 81 MG tablet Take 1 tablet (81 mg total) by mouth every morning.      . benazepril (LOTENSIN) 20 MG tablet Take 20 mg by mouth daily.      Marland Kitchen. EFFIENT 10 MG TABS tablet TAKE (1) TABLET BY MOUTH EACH MORNING.  30 tablet  6  . FLUoxetine (PROZAC) 20 MG tablet Take 20 mg by mouth  every morning.       . furosemide (LASIX) 80 MG tablet Take 80 mg by mouth daily as needed for edema.      Marland Kitchen. HYDROcodone-acetaminophen (NORCO) 10-325 MG per tablet Take 1 tablet by mouth every 6 (six) hours as needed. Pain      . levothyroxine (SYNTHROID, LEVOTHROID) 75 MCG tablet Take 75 mcg by mouth every morning.       . metoprolol succinate (TOPROL-XL) 50 MG 24 hr tablet Take 1 tablet (50 mg total) by mouth every morning. Take with or immediately following a meal.  90 tablet  1  . metroNIDAZOLE (FLAGYL) 500 MG tablet Take 1 tablet (500 mg total) by mouth 3 (three) times daily.  36 tablet  0  . Multiple Vitamin (MULTIVITAMIN WITH MINERALS) TABS tablet Take 1 tablet by mouth 2 (two) times daily.  25 tablet  4  . nitroGLYCERIN (NITROSTAT) 0.4 MG SL tablet Place 1 tablet (0.4 mg total) under the tongue every 5 (five) minutes as needed. Chest Pain  25 tablet  0  . omeprazole (PRILOSEC) 20 MG capsule Take 20 mg by  mouth daily.      . potassium chloride SA (K-DUR,KLOR-CON) 20 MEQ tablet TAKE (1) TABLET BY MOUTH EACH MORNING.  30 tablet  6  . potassium chloride SA (K-DUR,KLOR-CON) 20 MEQ tablet Take 1 tablet (20 mEq total) by mouth daily as needed (Only takes when she takes the Furosemide).  30 tablet  0  . rosuvastatin (CRESTOR) 5 MG tablet Take 1 tablet (5 mg total) by mouth daily.  30 tablet  6  . [DISCONTINUED] carbamazepine (TEGRETOL XR) 200 MG 12 hr tablet Take 200 mg by mouth 2 (two) times daily.        . [DISCONTINUED] lamoTRIgine (LAMICTAL) 100 MG tablet Take 100 mg by mouth 2 (two) times daily.         No current facility-administered medications for this visit.     Past Surgical History  Procedure Laterality Date  . Coronary artery bypass graft  2005    LIMA to AL, SVG to LAD, SVG to Cx, SVG-PDA  . Carotid endarterectomy  2005  . Left knee repair    . Right shoulder and elbow repair    . Abdominal hysterectomy    . Coronary angioplasty with stent placement  2011  . Cardiac  catheterization  Jan 2012    patent stents in RCA graft; CAD stable     Allergies  Allergen Reactions  . Lipitor [Atorvastatin] Other (See Comments)    Cramps  . Morphine And Related Itching      Family History  Problem Relation Age of Onset  . Heart attack Mother   . Heart disease Mother   . Breast cancer Mother   . Heart attack Father   . Heart disease Father   . Colon cancer Neg Hx      Social History [name] reports that she quit smoking about 3 years ago. Her smoking use included Cigarettes. She has a 22.5 pack-year smoking history. She has never used smokeless tobacco. [name] reports that she does not drink alcohol.   Review of Systems CONSTITUTIONAL: No weight loss, fever, chills, weakness or fatigue.  HEENT: Eyes: No visual loss, blurred vision, double vision or yellow sclerae.No hearing loss, sneezing, congestion, runny nose or sore throat.  SKIN: No rash or itching.  CARDIOVASCULAR:  RESPIRATORY: No shortness of breath, cough or sputum.  GASTROINTESTINAL: No anorexia, nausea, vomiting or diarrhea. No abdominal pain or blood.  GENITOURINARY: No burning on urination, no polyuria NEUROLOGICAL: No headache, dizziness, syncope, paralysis, ataxia, numbness or tingling in the extremities. No change in bowel or bladder control.  MUSCULOSKELETAL: No muscle, back pain, joint pain or stiffness.  LYMPHATICS: No enlarged nodes. No history of splenectomy.  PSYCHIATRIC: No history of depression or anxiety.  ENDOCRINOLOGIC: No reports of sweating, cold or heat intolerance. No polyuria or polydipsia.  Marland Kitchen   Physical Examination There were no vitals filed for this visit. There were no vitals filed for this visit.  Gen: resting comfortably, no acute distress HEENT: no scleral icterus, pupils equal round and reactive, no palptable cervical adenopathy,  CV Resp: Clear to auscultation bilaterally GI: abdomen is soft, non-tender, non-distended, normal bowel sounds, no  hepatosplenomegaly MSK: extremities are warm, no edema.  Skin: warm, no rash Neuro:  no focal deficits Psych: appropriate affect   Diagnostic Studies Echo 07/2009: LVEF 50-55%, mild to mod LVH. Akinesis basilar posterior myocardium. Mild LAE   02/2012 Carotid US: extensive plaque formation bilaterally w/ 50-59% stenosis bilaterally   Labs 01/2013: K 3.9 Cr 1.1 BUN 14  GFR 63   02/2010 Myoview:  Abnormal stress nuclear myocardial study revealing impaired exercise capacity, a rapid increase in heart rate at low level exercise consistent with physical deconditioning, mild left ventricular dilatation and mildly impaired left ventricular systolic function. By scintigraphic imaging, there was a sizable inferolateral infarction with substantial superimposed ischemia. Apparent septal perfusion abnormalities likely represent breast attenuation artifact. Other findings as noted.  Jan 2012 Cath: LM normal. LAD occluded after perforator. D1 subtotal occlusion. Some filling of collateral branches from LIMA. LCX occluded in midvessel. RCA 100% w/ left to right and right to right collaterals. Distal PDA fills from SVG. LIMA patent. SVG to RCA patent. LVEF 40%. Patent RCA grafts.   06/22/13 EKG: sinus, rate 65, normal axis, Q waves inferior leads, TWI w/ ST depression lateral leads old from prior EKGs     Assessment and Plan  1. Chest pain: - echo     Antoine Poche, M.D., F.A.C.C.

## 2013-11-16 ENCOUNTER — Telehealth: Payer: Self-pay | Admitting: *Deleted

## 2013-11-16 MED ORDER — OMEPRAZOLE 20 MG PO CPDR: 20.0000 mg | DELAYED_RELEASE_CAPSULE | Freq: Every day | ORAL | Status: DC

## 2013-11-16 NOTE — Telephone Encounter (Signed)
rx sent to pharmacy by e-script  

## 2013-11-16 NOTE — Telephone Encounter (Signed)
Pt needs Prilosec called in to [facility]

## 2013-11-24 ENCOUNTER — Other Ambulatory Visit: Payer: Self-pay | Admitting: Cardiology

## 2013-12-12 ENCOUNTER — Encounter: Payer: Self-pay | Admitting: *Deleted

## 2013-12-19 ENCOUNTER — Other Ambulatory Visit (HOSPITAL_COMMUNITY): Payer: Self-pay | Admitting: Pulmonary Disease

## 2013-12-19 ENCOUNTER — Ambulatory Visit (HOSPITAL_COMMUNITY)
Admission: RE | Admit: 2013-12-19 | Discharge: 2013-12-19 | Disposition: A | Discharge status: Home / Self Care | Payer: Medicaid Other | Source: Ambulatory Visit | Attending: Pulmonary Disease | Admitting: Pulmonary Disease

## 2013-12-19 ENCOUNTER — Other Ambulatory Visit: Payer: Self-pay | Admitting: Cardiology

## 2013-12-19 DIAGNOSIS — W19XXXA Unspecified fall, initial encounter: Secondary | ICD-10-CM

## 2013-12-19 DIAGNOSIS — M5137 Other intervertebral disc degeneration, lumbosacral region: Secondary | ICD-10-CM | POA: Insufficient documentation

## 2013-12-19 DIAGNOSIS — M51379 Other intervertebral disc degeneration, lumbosacral region without mention of lumbar back pain or lower extremity pain: Secondary | ICD-10-CM | POA: Insufficient documentation

## 2013-12-22 ENCOUNTER — Telehealth (HOSPITAL_COMMUNITY): Payer: Self-pay | Admitting: *Deleted

## 2013-12-22 ENCOUNTER — Encounter: Payer: Medicaid Other | Admitting: Adult Health

## 2013-12-26 ENCOUNTER — Other Ambulatory Visit: Payer: Self-pay | Admitting: Cardiology

## 2013-12-27 ENCOUNTER — Other Ambulatory Visit: Payer: Self-pay | Admitting: Cardiology

## 2013-12-29 ENCOUNTER — Telehealth: Payer: Self-pay | Admitting: *Deleted

## 2013-12-29 MED ORDER — METOPROLOL SUCCINATE ER 50 MG PO TB24: ORAL_TABLET | ORAL | Status: DC

## 2013-12-29 NOTE — Telephone Encounter (Signed)
PT NEEDS TOPROL CALLED IN TO [facility] SHE HAS BEEN OUT FOR TWO DAYS

## 2013-12-29 NOTE — Telephone Encounter (Signed)
Medication sent via escribe.  

## 2014-01-12 ENCOUNTER — Ambulatory Visit (INDEPENDENT_AMBULATORY_CARE_PROVIDER_SITE_OTHER): Payer: Medicaid Other | Admitting: Cardiology

## 2014-01-12 ENCOUNTER — Encounter: Payer: Self-pay | Admitting: Cardiology

## 2014-01-12 VITALS — BP 104/64 | HR 60 | Wt 209.1 lb

## 2014-01-12 DIAGNOSIS — I2510 Atherosclerotic heart disease of native coronary artery without angina pectoris: Secondary | ICD-10-CM

## 2014-01-12 DIAGNOSIS — E7800 Pure hypercholesterolemia, unspecified: Secondary | ICD-10-CM

## 2014-01-12 DIAGNOSIS — I6529 Occlusion and stenosis of unspecified carotid artery: Secondary | ICD-10-CM

## 2014-01-12 DIAGNOSIS — R079 Chest pain, unspecified: Secondary | ICD-10-CM

## 2014-01-12 DIAGNOSIS — R011 Cardiac murmur, unspecified: Secondary | ICD-10-CM

## 2014-01-12 NOTE — Patient Instructions (Addendum)
Your physician wants you to follow-up in: 4 months You will receive a reminder letter in the mail two months in advance. If you don't receive a letter, please call our office to schedule the follow-up appointment.  Your physician recommends that you continue on your current medications as directed. Please refer to the Current Medication list given to you today.  Your physician has requested that you have an echocardiogram. Echocardiography is a painless test that uses sound waves to create images of your heart. It provides your doctor with information about the size and shape of your heart and how well your heart's chambers and valves are working. This procedure takes approximately one hour. There are no restrictions for this procedure.  Your physician has requested that you have a carotid duplex. This test is an ultrasound of the carotid arteries in your neck. It looks at blood flow through these arteries that supply the brain with blood. Allow one hour for this exam. There are no restrictions or special instructions.  Please ger lab work when you have been fasting (lipid profile)   Thank you for choosing  [facility] !

## 2014-01-12 NOTE — Progress Notes (Signed)
Clinical Summary [name] is a 58 y.o.female seen today for follow up of the following medical problems.   1. CAD  - CABG 2005 4 vessel  - history of prior MI, prior stents remotely.  - 09/2013 MPI large inferolateral scar with no ischemia, LVEF 50%.  - reports occasional chest pressure at times that is unchanged. Better after starting norvasc.   2. Carotid stenosis  - carotid US 02/2012: extensive bilateral plaque w/ 50-69% stenosis bilaterally - denies any neuro symptoms  3. HTN - compliant meds  4. Tobacco - stopped smoking 2 weeks ago  5. Hyperlipidemia - compliant with statin - 09/2013 lipid panel: TC 245 TG 428 HDL 30 LDL not calculated Past Medical History  Diagnosis Date  . Coronary artery disease   . Hypertension   . COPD (chronic obstructive pulmonary disease)   . Hyperlipidemia   . GERD (gastroesophageal reflux disease)   . Seizure disorder   . Chronic low back pain   . Kidney stones   . Diverticulitis     4 documented episodes by CT; most recent April 2012  . MI (myocardial infarction) Nov 2011    also 2005  . Sleep apnea     has been diagnosed but didn't follow up to get CPAP  . CHF (congestive heart failure)   . Depression   . Anxiety   . Shortness of breath   . Pneumonia   . Seizures   . Arthritis      Allergies  Allergen Reactions  . Lipitor [Atorvastatin] Other (See Comments)    Cramps  . Morphine And Related Itching     Current Outpatient Prescriptions  Medication Sig Dispense Refill  . albuterol (PROAIR HFA) 108 (90 BASE) MCG/ACT inhaler Inhale 2 puffs into the lungs every 6 (six) hours as needed for wheezing or shortness of breath.      . ALPRAZolam (XANAX) 1 MG tablet Take 1 mg by mouth 3 (three) times daily as needed for anxiety or sleep.       Marland Kitchen. amLODipine (NORVASC) 5 MG tablet TAKE ONE TABLET BY MOUTH ONCE DAILY.  30 tablet  6  . aspirin 81 MG tablet Take 1 tablet (81 mg total) by mouth every morning.      . benazepril  (LOTENSIN) 20 MG tablet Take 20 mg by mouth daily.      Marland Kitchen. EFFIENT 10 MG TABS tablet TAKE (1) TABLET BY MOUTH EACH MORNING.  30 tablet  6  . FLUoxetine (PROZAC) 20 MG tablet Take 20 mg by mouth every morning.       . furosemide (LASIX) 80 MG tablet TAKE ONE TABLET DAILY.  15 tablet  0  . HYDROcodone-acetaminophen (NORCO) 10-325 MG per tablet Take 1 tablet by mouth every 6 (six) hours as needed. Pain      . levothyroxine (SYNTHROID, LEVOTHROID) 75 MCG tablet Take 75 mcg by mouth every morning.       . metoprolol succinate (TOPROL XL) 50 MG 24 hr tablet TAKE (1) TABLET BY MOUTH EACH MORNING WITH OR IMMEDIATELY FOLLOWING AMEAL.  30 tablet  6  . metroNIDAZOLE (FLAGYL) 500 MG tablet Take 1 tablet (500 mg total) by mouth 3 (three) times daily.  36 tablet  0  . Multiple Vitamin (MULTIVITAMIN WITH MINERALS) TABS tablet Take 1 tablet by mouth 2 (two) times daily.  25 tablet  4  . NITROSTAT 0.4 MG SL tablet DISSOLVE 1 TABLET UNDER TONGUE EVERY 5 MINUTES UP TO 15 MIN  FOR CHESTPAIN. IF NO RELIEF CALL 911.  25 tablet  3  . omeprazole (PRILOSEC) 20 MG capsule Take 1 capsule (20 mg total) by mouth daily.  30 capsule  6  . potassium chloride SA (K-DUR,KLOR-CON) 20 MEQ tablet TAKE (1) TABLET BY MOUTH EACH MORNING.  30 tablet  6  . potassium chloride SA (K-DUR,KLOR-CON) 20 MEQ tablet Take 1 tablet (20 mEq total) by mouth daily as needed (Only takes when she takes the Furosemide).  30 tablet  0  . rosuvastatin (CRESTOR) 5 MG tablet Take 1 tablet (5 mg total) by mouth daily.  30 tablet  6  . [DISCONTINUED] carbamazepine (TEGRETOL XR) 200 MG 12 hr tablet Take 200 mg by mouth 2 (two) times daily.        . [DISCONTINUED] lamoTRIgine (LAMICTAL) 100 MG tablet Take 100 mg by mouth 2 (two) times daily.         No current facility-administered medications for this visit.     Past Surgical History  Procedure Laterality Date  . Coronary artery bypass graft  2005    LIMA to AL, SVG to LAD, SVG to Cx, SVG-PDA  . Carotid  endarterectomy  2005  . Left knee repair    . Right shoulder and elbow repair    . Abdominal hysterectomy    . Coronary angioplasty with stent placement  2011  . Cardiac catheterization  Jan 2012    patent stents in RCA graft; CAD stable     Allergies  Allergen Reactions  . Lipitor [Atorvastatin] Other (See Comments)    Cramps  . Morphine And Related Itching      Family History  Problem Relation Age of Onset  . Heart attack Mother   . Heart disease Mother   . Breast cancer Mother   . Heart attack Father   . Heart disease Father   . Colon cancer Neg Hx      Social History [name] reports that she quit smoking about 3 years ago. Her smoking use included Cigarettes. She has a 22.5 pack-year smoking history. She has never used smokeless tobacco. [name] reports that she does not drink alcohol.   Review of Systems CONSTITUTIONAL: No weight loss, fever, chills, weakness or fatigue.  HEENT: Eyes: No visual loss, blurred vision, double vision or yellow sclerae.No hearing loss, sneezing, congestion, runny nose or sore throat.  SKIN: No rash or itching.  CARDIOVASCULAR: per HPI RESPIRATORY: No shortness of breath, cough or sputum.  GASTROINTESTINAL: No anorexia, nausea, vomiting or diarrhea. No abdominal pain or blood.  GENITOURINARY: No burning on urination, no polyuria NEUROLOGICAL: No headache, dizziness, syncope, paralysis, ataxia, numbness or tingling in the extremities. No change in bowel or bladder control.  MUSCULOSKELETAL: No muscle, back pain, joint pain or stiffness.  LYMPHATICS: No enlarged nodes. No history of splenectomy.  PSYCHIATRIC: No history of depression or anxiety.  ENDOCRINOLOGIC: No reports of sweating, cold or heat intolerance. No polyuria or polydipsia.  Marland Kitchen   Physical Examination p 60 bp 104/64 Wt 209 lbs Gen: resting comfortably, no acute distress HEENT: no scleral icterus, pupils equal round and reactive, no palptable cervical adenopathy,  CV:  RRR, 2/6 systolic murmur RUSB, no JVD, no carotid bruits Resp: Clear to auscultation bilaterally GI: abdomen is soft, non-tender, non-distended, normal bowel sounds, no hepatosplenomegaly MSK: extremities are warm, no edema.  Skin: warm, no rash Neuro:  no focal deficits Psych: appropriate affect   Diagnostic Studies  Cath 2012 Left main coronary was normal.  Left anterior descending artery was occluded just after the septal perforator.  First diagonal Gennette Shadix was subtotally occluded.  There was some filling of the distal LAD and diagonals from collateral branches from the LIMA.  Circumflex coronary artery was occluded in the midvessel.  The AV groove Gypsy Kellogg was patent.  There was a small first obtuse marginal Evon Dejarnett with 30-40% multiple lesions.  There was a small second obtuse marginal [place] Lasseter with 30-40% discrete lesions.  There is a very large obtuse marginal Treavor Blomquist seen filling from collaterals from the left internal mammary artery.  Right coronary artery was 100% occluded proximally with both left-to- right and right-to-right collaterals.  The distal PDA also filled from the vein graft to the right coronary artery.  The left internal mammary artery was widely patent.  It also filled the distal right large obtuse marginal Lyall Faciane and filled retrograde to a smaller diagonal Felise Georgia.  There was known occlusion of the 2 left-sided vein grafts.  The saphenous vein graft to the right coronary artery was widely patent. Bare-metal stent in the mid-to-distal vessel widely patent.  There was poor runoff to the vessel primarily being retrograde into the posterior lateral Creedence Heiss, however, there was no new focal stenosis.  RAO ventriculography.  RAO ventriculography showed anterior apical and mid inferior wall hypokinesis.  EF was in the 40% range.  Right heart catheterization showed mean pulmonary capillary wedge pressure of 24, PA pressure was 55/26, RV pressure was 52/5, RA  pressure mean was 16, aortic pressure was 155/76, LV pressure was equal to 167/16 with a post A-wave EDP of 26.  Cardiac output was 6.3 liters per minute with a cardiac index of 3 liters per minute per meter squared by Fick.  IMPRESSION:  The patient's coronary artery disease is stable.  The stents in the RCA graft were patent.  There are no new lesions that I can see.  She does have collateralized distal right and OM Tron Flythe.  Continued medical therapy is warranted.  If she continues to have chest pain, Ranexa may be in order since it is somewhat late in the day and she had both venous and arterial sheath.  She will be kept overnight and discharged in the morning.  She tolerated the procedure well.    Assessment and Plan   1. CAD - occasional atypical symptoms, recent MPI without ischemia - continue current medical therpay  2. Carotid stenosis - will repeat study later this year. No symptos  3. HTN - at goal, continue current meds  4. Tobacco - she has succesfully stopped smoking  5. Hyperlipidemia - abnormal on last check, will repeat. Medication changes pending results.  6. Heart murmur - obtain echo      Antoine Poche, M.D., F.A.C.C.

## 2014-01-22 ENCOUNTER — Other Ambulatory Visit: Payer: Self-pay | Admitting: Cardiology

## 2014-02-05 ENCOUNTER — Other Ambulatory Visit: Payer: Self-pay | Admitting: Cardiology

## 2014-02-20 ENCOUNTER — Telehealth: Payer: Self-pay | Admitting: *Deleted

## 2014-02-20 NOTE — Telephone Encounter (Signed)
Would confer with Dr. Wyline MoodBranch, as this is his patient.

## 2014-02-20 NOTE — Telephone Encounter (Signed)
Pt states that she is taking effient and asprin everyday and she has been bruising badly. She wants to know if she can cut back.   She was also told at last visit that she needs to have some testing done. Test were never scheduled. We do have orders in system for them btu she want to know if she needs to be seen first since its been a while or to go ahead and have them done.

## 2014-02-20 NOTE — Telephone Encounter (Signed)
Pt states she has been bruising badly on her stomach. Pt states she is taking an aspirin 325 daily. We have her as taking 81 mg. I made pt aware that's what we had in our chart. Pt has been taking asa 325 and effient ever since her stent was placed in 2011. Please advise

## 2014-02-20 NOTE — Telephone Encounter (Signed)
Sent to Dr Branch for review. 

## 2014-02-23 ENCOUNTER — Telehealth: Payer: Self-pay | Admitting: *Deleted

## 2014-02-23 NOTE — Telephone Encounter (Signed)
Please read previous note also. Pt has been taking asa 325 and wants to know if she can be on Asa 81 mg. As of last note that is what we had her on. Please advise if ok to take 81 mg Asa.

## 2014-02-23 NOTE — Telephone Encounter (Signed)
Pt made aware,will take ASA 81 mg qd

## 2014-02-23 NOTE — Telephone Encounter (Signed)
Pt states she has been watching for two days to figure out why she is getting bruises on her belly. Pt has figured out that she has been rubbing up against the sharpe counter corner.   Pt states that she is still going to continue with the lower dose of Asprin.

## 2014-02-23 NOTE — Telephone Encounter (Signed)
Our records indicate that she is on ASA 81mg  daily. If she is taking 325mg  daily, then yes decreasing to 81mg  daily is fine.  [name] MD

## 2014-03-01 ENCOUNTER — Other Ambulatory Visit (HOSPITAL_COMMUNITY): Payer: Self-pay | Admitting: Pulmonary Disease

## 2014-03-01 ENCOUNTER — Ambulatory Visit (HOSPITAL_COMMUNITY)
Admission: RE | Admit: 2014-03-01 | Discharge: 2014-03-01 | Disposition: A | Discharge status: Home / Self Care | Payer: Medicaid Other | Source: Ambulatory Visit | Attending: Pulmonary Disease | Admitting: Pulmonary Disease

## 2014-03-01 DIAGNOSIS — IMO0002 Reserved for concepts with insufficient information to code with codable children: Secondary | ICD-10-CM | POA: Insufficient documentation

## 2014-03-01 DIAGNOSIS — R51 Headache: Principal | ICD-10-CM

## 2014-03-01 DIAGNOSIS — R519 Headache, unspecified: Secondary | ICD-10-CM

## 2014-03-01 DIAGNOSIS — M47812 Spondylosis without myelopathy or radiculopathy, cervical region: Secondary | ICD-10-CM | POA: Insufficient documentation

## 2014-03-01 DIAGNOSIS — W19XXXA Unspecified fall, initial encounter: Secondary | ICD-10-CM

## 2014-03-01 DIAGNOSIS — M542 Cervicalgia: Secondary | ICD-10-CM

## 2014-03-02 NOTE — Progress Notes (Signed)
This encounter was created in error - please disregard.

## 2014-03-12 ENCOUNTER — Ambulatory Visit (HOSPITAL_COMMUNITY): Payer: Medicaid Other | Admitting: Psychiatry

## 2014-03-29 ENCOUNTER — Telehealth: Payer: Self-pay | Admitting: *Deleted

## 2014-03-29 NOTE — Telephone Encounter (Signed)
[name] states that Dr Wyline MoodBranch told her she has a heart murmur. She is calling to get some more information on this and what it means.

## 2014-03-29 NOTE — Telephone Encounter (Signed)
Pt needed to make both carotid US and echo apt before August apt with Dr.Banch, apt scheduled for 04/02/14 at 11:45

## 2014-03-29 NOTE — Telephone Encounter (Signed)
Pt is very concerned about certain issues and she always has multiple questions about her heart. Please let me know what to advise her about her heart murmur.

## 2014-03-29 NOTE — Telephone Encounter (Signed)
We had talked about an echo for her heart murmur in 01/2014, I see it ordered but do not see it has been done. Can you follow up on it  [name] Branch MD

## 2014-04-02 ENCOUNTER — Ambulatory Visit (HOSPITAL_COMMUNITY)
Admission: RE | Admit: 2014-04-02 | Discharge: 2014-04-02 | Disposition: A | Discharge status: Home / Self Care | Payer: Medicaid Other | Source: Ambulatory Visit | Attending: Cardiology | Admitting: Cardiology

## 2014-04-02 DIAGNOSIS — I517 Cardiomegaly: Secondary | ICD-10-CM

## 2014-04-02 DIAGNOSIS — I6529 Occlusion and stenosis of unspecified carotid artery: Secondary | ICD-10-CM

## 2014-04-02 DIAGNOSIS — R011 Cardiac murmur, unspecified: Secondary | ICD-10-CM | POA: Insufficient documentation

## 2014-04-02 NOTE — Progress Notes (Signed)
Echocardiogram 2D Echocardiogram has been performed.  Dorothey BasemanReel, James M 04/02/2014, 1:17 PM

## 2014-04-03 LAB — LIPID PANEL
CHOLESTEROL: 174 mg/dL (ref 0–200)
HDL: 36 mg/dL — ABNORMAL LOW (ref >39)
LDL Cholesterol: 79 mg/dL (ref 0–99)
TRIGLYCERIDES: 293 mg/dL — AB (ref <150)
Total CHOL/HDL Ratio: 4.8 Ratio
VLDL: 59 mg/dL — ABNORMAL HIGH (ref 0–40)

## 2014-04-04 ENCOUNTER — Encounter: Payer: Self-pay | Admitting: Cardiology

## 2014-04-09 ENCOUNTER — Other Ambulatory Visit: Payer: Self-pay

## 2014-04-09 MED ORDER — ROSUVASTATIN CALCIUM 10 MG PO TABS: 10.0000 mg | ORAL_TABLET | Freq: Every day | ORAL | Status: DC

## 2014-04-09 NOTE — Progress Notes (Signed)
Per MD crestor increased to 10 mg qd,pt aware

## 2014-04-13 ENCOUNTER — Telehealth: Payer: Self-pay | Admitting: Adult Health

## 2014-04-13 MED ORDER — ROSUVASTATIN CALCIUM 5 MG PO TABS: 5.0000 mg | ORAL_TABLET | Freq: Every day | ORAL | Status: DC

## 2014-04-13 NOTE — Telephone Encounter (Signed)
Pt does tolerate Crestor 5 mg  And will continue at this dose

## 2014-04-13 NOTE — Telephone Encounter (Signed)
Pt can't tolerate increased dose of Crestor.She has cramping calves and intolerable muscle pain.She states she tolerated lovastatin ok in the past

## 2014-04-13 NOTE — Telephone Encounter (Signed)
Patient states that she is having a lot of side effects with increasing her Crestor / tgs

## 2014-04-13 NOTE — Telephone Encounter (Signed)
Did she do ok on crestor 5mg  previously? If not, then can change to lovastatin 80mg  daily. Preferred would be crestor 5mg  daily   Dominga FerryJ Clifford Benninger MD

## 2014-04-13 NOTE — Addendum Note (Signed)
Addended by: Marlyn CorporalARLTON, Cadon Raczka A on: 04/13/2014 02:26 PM   Modules accepted: Orders

## 2014-04-23 ENCOUNTER — Other Ambulatory Visit: Payer: Self-pay

## 2014-04-23 ENCOUNTER — Telehealth: Payer: Self-pay | Admitting: *Deleted

## 2014-04-23 MED ORDER — LOVASTATIN 40 MG PO TABS: 40.0000 mg | ORAL_TABLET | Freq: Every day | ORAL | Status: DC

## 2014-04-23 NOTE — Telephone Encounter (Signed)
Placed back on lovastatin 40 mg hs

## 2014-04-23 NOTE — Telephone Encounter (Signed)
Ok to resume previous dose of lovastatin

## 2014-04-23 NOTE — Telephone Encounter (Signed)
Cannot tolerate crestor she is too fatigued and wants to go back on lovastatin

## 2014-04-23 NOTE — Telephone Encounter (Signed)
Pt is asking to speak with cathy about crestor she has been taking.

## 2014-05-21 ENCOUNTER — Encounter: Payer: Medicaid Other | Admitting: Cardiology

## 2014-05-21 NOTE — Progress Notes (Signed)
ERROR

## 2014-05-22 ENCOUNTER — Other Ambulatory Visit: Payer: Self-pay | Admitting: Cardiology

## 2014-05-22 ENCOUNTER — Encounter: Payer: Self-pay | Admitting: Cardiology

## 2014-05-22 ENCOUNTER — Encounter (HOSPITAL_COMMUNITY): Payer: Self-pay | Admitting: Pharmacy Technician

## 2014-05-22 ENCOUNTER — Ambulatory Visit (INDEPENDENT_AMBULATORY_CARE_PROVIDER_SITE_OTHER): Payer: Medicaid Other | Admitting: Cardiology

## 2014-05-22 VITALS — BP 112/62 | HR 75 | Ht 64.0 in | Wt 238.0 lb

## 2014-05-22 DIAGNOSIS — I10 Essential (primary) hypertension: Secondary | ICD-10-CM

## 2014-05-22 DIAGNOSIS — I25119 Atherosclerotic heart disease of native coronary artery with unspecified angina pectoris: Secondary | ICD-10-CM

## 2014-05-22 DIAGNOSIS — I2510 Atherosclerotic heart disease of native coronary artery without angina pectoris: Secondary | ICD-10-CM

## 2014-05-22 DIAGNOSIS — I209 Angina pectoris, unspecified: Secondary | ICD-10-CM

## 2014-05-22 DIAGNOSIS — R079 Chest pain, unspecified: Secondary | ICD-10-CM

## 2014-05-22 DIAGNOSIS — E7800 Pure hypercholesterolemia, unspecified: Secondary | ICD-10-CM

## 2014-05-22 MED ORDER — AMLODIPINE BESYLATE 10 MG PO TABS: 10.0000 mg | ORAL_TABLET | Freq: Every day | ORAL | Status: DC

## 2014-05-22 NOTE — Patient Instructions (Addendum)
Your physician recommends that you schedule a follow-up appointment in: to be determined after heart cath     Your physician has requested that you have a cardiac catheterization. Cardiac catheterization is used to diagnose and/or treat various heart conditions. Doctors may recommend this procedure for a number of different reasons. The most common reason is to evaluate chest pain. Chest pain can be a symptom of coronary artery disease (CAD), and cardiac catheterization can show whether plaque is narrowing or blocking your heart's arteries. This procedure is also used to evaluate the valves, as well as measure the blood flow and oxygen levels in different parts of your heart. For further information please visit [email or web address] Please follow instruction sheet, as given.   Your physician has recommended you make the following change in your medication: INCREASE Norvasc to 10 mg daily    Thank you for choosing [facility] !

## 2014-05-22 NOTE — Progress Notes (Signed)
Clinical Summary [name] is a 58 y.o.female seen today for follow up of the following medical problems.   1. CAD  - CABG 2005 4 vessel  - history of prior MI, prior stents remotely.  - 09/2013 MPI large inferolateral scar with no ischemia, LVEF 50%.  - around time of her stress test she had been having some chest pain. She responded well to addition of norvasc at that time, and given lack of ischemia on MPI was managed medically  - reports symptoms of exertional chest pain that have worsened over the last 3 months. Feeling pressure in midchest 8/10, + diaphoresis. + SOB. Occurs with low levels activity like making bed, sweeping, or mopping. Increased DOE with activities.Walks around block in [place]ih[place]hood, gets SOB and pain when reaches incline. When stops pain resolves. Pain better with NG.  2. Carotid stenosis  - carotid US 02/2012: extensive bilateral plaque w/ 50-69% stenosis bilaterally  - denies any neuro symptoms   3. HTN  - compliant meds   4. Tobacco  - quit few months ago - 5. Hyperlipidemia  - compliant with statin  - 03/2014 lipid panel: TC 174 TG 293 HDL 36 LDL 79 - tried to change to crestor last visit, however she reports severe fatigue. Has only been able to tolerate lovastatin which she is back on .    Past Medical History  Diagnosis Date  . Coronary artery disease   . Hypertension   . COPD (chronic obstructive pulmonary disease)   . Hyperlipidemia   . GERD (gastroesophageal reflux disease)   . Seizure disorder   . Chronic low back pain   . Kidney stones   . Diverticulitis     4 documented episodes by CT; most recent April 2012  . MI (myocardial infarction) Nov 2011    also 2005  . Sleep apnea     has been diagnosed but didn't follow up to get CPAP  . CHF (congestive heart failure)   . Depression   . Anxiety   . Shortness of breath   . Pneumonia   . Seizures   . Arthritis      Allergies  Allergen Reactions  . Lipitor [Atorvastatin]  Other (See Comments)    Cramps  . Morphine And Related Itching     Current Outpatient Prescriptions  Medication Sig Dispense Refill  . albuterol (PROAIR HFA) 108 (90 BASE) MCG/ACT inhaler Inhale 2 puffs into the lungs every 6 (six) hours as needed for wheezing or shortness of breath.      . ALPRAZolam (XANAX) 1 MG tablet Take 1 mg by mouth 3 (three) times daily as needed for anxiety or sleep.       Marland Kitchen. amLODipine (NORVASC) 5 MG tablet TAKE ONE TABLET BY MOUTH ONCE DAILY.  30 tablet  6  . aspirin 81 MG tablet Take 1 tablet (81 mg total) by mouth every morning.      . benazepril (LOTENSIN) 20 MG tablet TAKE (1) TABLET BY MOUTH EACH MORNING.  30 tablet  6  . EFFIENT 10 MG TABS tablet TAKE (1) TABLET BY MOUTH EACH MORNING.  30 tablet  6  . FLUoxetine (PROZAC) 20 MG tablet Take 20 mg by mouth every morning.       . furosemide (LASIX) 80 MG tablet TAKE ONE TABLET DAILY. CALL OFFICE AND MAKE AN APPOINTMENT.  90 tablet  3  . HYDROcodone-acetaminophen (NORCO) 10-325 MG per tablet Take 1 tablet by mouth every 6 (six) hours  as needed. Pain      . levothyroxine (SYNTHROID, LEVOTHROID) 75 MCG tablet Take 75 mcg by mouth every morning.       . lovastatin (MEVACOR) 40 MG tablet Take 1 tablet (40 mg total) by mouth at bedtime.  30 tablet  6  . metoprolol succinate (TOPROL XL) 50 MG 24 hr tablet TAKE (1) TABLET BY MOUTH EACH MORNING WITH OR IMMEDIATELY FOLLOWING AMEAL.  30 tablet  6  . metroNIDAZOLE (FLAGYL) 500 MG tablet Take 1 tablet (500 mg total) by mouth 3 (three) times daily.  36 tablet  0  . Multiple Vitamin (MULTIVITAMIN WITH MINERALS) TABS tablet Take 1 tablet by mouth 2 (two) times daily.  25 tablet  4  . NITROSTAT 0.4 MG SL tablet DISSOLVE 1 TABLET UNDER TONGUE EVERY 5 MINUTES UP TO 15 MIN FOR CHESTPAIN. IF NO RELIEF CALL 911.  25 tablet  3  . omeprazole (PRILOSEC) 20 MG capsule Take 1 capsule (20 mg total) by mouth daily.  30 capsule  6  . potassium chloride SA (K-DUR,KLOR-CON) 20 MEQ tablet TAKE  (1) TABLET BY MOUTH EACH MORNING.  30 tablet  6  . potassium chloride SA (K-DUR,KLOR-CON) 20 MEQ tablet Take 1 tablet (20 mEq total) by mouth daily as needed (Only takes when she takes the Furosemide).  30 tablet  0  . rosuvastatin (CRESTOR) 5 MG tablet Take 1 tablet (5 mg total) by mouth daily.  90 tablet  3  . [DISCONTINUED] carbamazepine (TEGRETOL XR) 200 MG 12 hr tablet Take 200 mg by mouth 2 (two) times daily.        . [DISCONTINUED] lamoTRIgine (LAMICTAL) 100 MG tablet Take 100 mg by mouth 2 (two) times daily.         No current facility-administered medications for this visit.     Past Surgical History  Procedure Laterality Date  . Coronary artery bypass graft  2005    LIMA to AL, SVG to LAD, SVG to Cx, SVG-PDA  . Carotid endarterectomy  2005  . Left knee repair    . Right shoulder and elbow repair    . Abdominal hysterectomy    . Coronary angioplasty with stent placement  2011  . Cardiac catheterization  Jan 2012    patent stents in RCA graft; CAD stable     Allergies  Allergen Reactions  . Lipitor [Atorvastatin] Other (See Comments)    Cramps  . Morphine And Related Itching      Family History  Problem Relation Age of Onset  . Heart attack Mother   . Heart disease Mother   . Breast cancer Mother   . Heart attack Father   . Heart disease Father   . Colon cancer Neg Hx      Social History [name] reports that she quit smoking about 4 years ago. Her smoking use included Cigarettes. She has a 22.5 pack-year smoking history. She has never used smokeless tobacco. [name] reports that she does not drink alcohol.   Review of Systems CONSTITUTIONAL: No weight loss, fever, chills, weakness or fatigue.  HEENT: Eyes: No visual loss, blurred vision, double vision or yellow sclerae.No hearing loss, sneezing, congestion, runny nose or sore throat.  SKIN: No rash or itching.  CARDIOVASCULAR: per HPI RESPIRATORY: No shortness of breath, cough or sputum.    GASTROINTESTINAL: No anorexia, nausea, vomiting or diarrhea. No abdominal pain or blood.  GENITOURINARY: No burning on urination, no polyuria NEUROLOGICAL: No headache, dizziness, syncope, paralysis, ataxia, numbness or  tingling in the extremities. No change in bowel or bladder control.  MUSCULOSKELETAL: No muscle, back pain, joint pain or stiffness.  LYMPHATICS: No enlarged nodes. No history of splenectomy.  PSYCHIATRIC: No history of depression or anxiety.  ENDOCRINOLOGIC: No reports of sweating, cold or heat intolerance. No polyuria or polydipsia.  Marland Kitchen   Physical Examination p 75 bp 112/62 Wt 238 lbs BMI 41 Gen: resting comfortably, no acute distress HEENT: no scleral icterus, pupils equal round and reactive, no palptable cervical adenopathy,  CV: RRR, 2/6 sysotlic murmur RUSB, no JVD, no carotid bruits Resp: Clear to auscultation bilaterally GI: abdomen is soft, non-tender, non-distended, normal bowel sounds, no hepatosplenomegaly MSK: extremities are warm, no edema.  Skin: warm, no rash Neuro:  no focal deficits Psych: appropriate affect   Diagnostic Studies Cath 2012  Left main coronary was normal.  Left anterior descending artery was occluded just after the septal  perforator. First diagonal Tally Mckinnon was subtotally occluded. There was  some filling of the distal LAD and diagonals from collateral branches  from the LIMA. Circumflex coronary artery was occluded in the  midvessel. The AV groove Sherice Ijames was patent. There was a small first  obtuse marginal Lydiann Bonifas with 30-40% multiple lesions. There was a small  second obtuse marginal Taresa Montville with 30-40% discrete lesions. There is a  very large obtuse marginal Tao Satz seen filling from collaterals from the  left internal mammary artery.  Right coronary artery was 100% occluded proximally with both left-to-  right and right-to-right collaterals. The distal PDA also filled from  the vein graft to the right coronary artery.  The left  internal mammary artery was widely patent. It also filled the  distal right large obtuse marginal Abdoulaye Drum and filled retrograde to a  smaller diagonal Lee-Anne Flicker.  There was known occlusion of the 2 left-sided vein grafts. The  saphenous vein graft to the right coronary artery was widely patent.  Bare-metal stent in the mid-to-distal vessel widely patent. There was  poor runoff to the vessel primarily being retrograde into the posterior  lateral Asuzena Weis, however, there was no new focal stenosis.  RAO ventriculography. RAO ventriculography showed anterior apical and  mid inferior wall hypokinesis. EF was in the 40% range.  Right heart catheterization showed mean pulmonary capillary wedge  pressure of 24, PA pressure was 55/26, RV pressure was 52/5, RA pressure  mean was 16, aortic pressure was 155/76, LV pressure was equal to 167/16  with a post A-wave EDP of 26.  Cardiac output was 6.3 liters per minute with a cardiac index of 3  liters per minute per meter squared by Fick.   IMPRESSION: The patient's coronary artery disease is stable. The  stents in the RCA graft were patent. There are no new lesions that I  can see. She does have collateralized distal right and OM Stepahnie Campo.  Continued medical therapy is warranted. If she continues to have chest  pain, Ranexa may be in order since it is somewhat late in the day and  she had both venous and arterial sheath. She will be kept overnight and  discharged in the morning. She tolerated the procedure well.   03/2014 Echo  Study Conclusions  - Left ventricle: The cavity size was normal. Wall thickness was increased in a pattern of mild LVH. Systolic function was normal. The estimated ejection fraction was in the range of 50% to 55%. There is akinesis of the basalinferolateral myocardium. Features are consistent with a pseudonormal left ventricular filling pattern, with concomitant  abnormal relaxation and increased filling pressure (grade 2 diastolic  dysfunction). - Aortic valve: Mildly calcified annulus. Trileaflet. There was no significant regurgitation. - Mitral valve: There was trivial regurgitation. - Left atrium: The atrium was mildly to moderately dilated. - Right ventricle: Systolic function was mildly reduced. - Right atrium: Central venous pressure (est): 3 mm Hg. - Atrial septum: No defect or patent foramen ovale was identified. - Tricuspid valve: There was trivial regurgitation. - Pulmonary arteries: Systolic pressure could not be accurately estimated. - Pericardium, extracardiac: There was no pericardial effusion.  Impressions:  - Mild LVH with LVEF 50-55%, basal inferolateral hypokinesis, grade 2 diastolic dysfunction. Mild to moderate left atrial enlargement. Mildly reduced RV contraction. Unable to assess PASP.  03/2014 Carotid US  IMPRESSION:  Bilateral 50-69% stenosis. The left internal carotid peak systolic  velocity has increased slightly in the interval from the prior exam.     Assessment and Plan  1. CAD  - progression of exertional chest pain concerning for angina despite being on 2 antianginals. Will refer for LHC. She has been on long term effient, will continue at this time.   2. Carotid stenosis  - 03/2014 carotid US with moderate bilateral disease  - continue to follow   3. HTN  - at goal, continue current meds   4. Tobacco  - she has succesfully stopped smoking   5. Hyperlipidemia  - has not tolerated high dose statins, continue lovastatin     F/u pending cath results    Antoine Poche, M.D., F.A.C.C.

## 2014-05-22 NOTE — Addendum Note (Signed)
Addended by: Marlyn CorporalARLTON, Doren Kaspar A on: 05/22/2014 12:08 PM   Modules accepted: Orders

## 2014-05-23 ENCOUNTER — Ambulatory Visit: Payer: Medicaid Other | Admitting: Cardiology

## 2014-05-24 ENCOUNTER — Ambulatory Visit (HOSPITAL_COMMUNITY)
Admission: RE | Admit: 2014-05-24 | Discharge: 2014-05-25 | Disposition: A | Discharge status: Home / Self Care | Payer: Medicaid Other | Source: Ambulatory Visit | Attending: Cardiology | Admitting: Cardiology

## 2014-05-24 ENCOUNTER — Encounter (HOSPITAL_COMMUNITY)
Admission: RE | Disposition: A | Discharge status: Home / Self Care | Payer: Self-pay | Source: Ambulatory Visit | Attending: Cardiology

## 2014-05-24 ENCOUNTER — Encounter (HOSPITAL_COMMUNITY): Payer: Self-pay | Admitting: General Practice

## 2014-05-24 DIAGNOSIS — F3289 Other specified depressive episodes: Secondary | ICD-10-CM | POA: Diagnosis not present

## 2014-05-24 DIAGNOSIS — I658 Occlusion and stenosis of other precerebral arteries: Secondary | ICD-10-CM | POA: Diagnosis not present

## 2014-05-24 DIAGNOSIS — Z87891 Personal history of nicotine dependence: Secondary | ICD-10-CM | POA: Diagnosis not present

## 2014-05-24 DIAGNOSIS — I2510 Atherosclerotic heart disease of native coronary artery without angina pectoris: Secondary | ICD-10-CM | POA: Insufficient documentation

## 2014-05-24 DIAGNOSIS — K219 Gastro-esophageal reflux disease without esophagitis: Secondary | ICD-10-CM | POA: Diagnosis not present

## 2014-05-24 DIAGNOSIS — I10 Essential (primary) hypertension: Secondary | ICD-10-CM | POA: Diagnosis not present

## 2014-05-24 DIAGNOSIS — F411 Generalized anxiety disorder: Secondary | ICD-10-CM | POA: Insufficient documentation

## 2014-05-24 DIAGNOSIS — F329 Major depressive disorder, single episode, unspecified: Secondary | ICD-10-CM | POA: Insufficient documentation

## 2014-05-24 DIAGNOSIS — Z9861 Coronary angioplasty status: Secondary | ICD-10-CM | POA: Insufficient documentation

## 2014-05-24 DIAGNOSIS — E785 Hyperlipidemia, unspecified: Secondary | ICD-10-CM | POA: Diagnosis not present

## 2014-05-24 DIAGNOSIS — J449 Chronic obstructive pulmonary disease, unspecified: Secondary | ICD-10-CM | POA: Diagnosis not present

## 2014-05-24 DIAGNOSIS — I25810 Atherosclerosis of coronary artery bypass graft(s) without angina pectoris: Secondary | ICD-10-CM | POA: Insufficient documentation

## 2014-05-24 DIAGNOSIS — Z955 Presence of coronary angioplasty implant and graft: Secondary | ICD-10-CM

## 2014-05-24 DIAGNOSIS — G4730 Sleep apnea, unspecified: Secondary | ICD-10-CM | POA: Diagnosis not present

## 2014-05-24 DIAGNOSIS — Z7982 Long term (current) use of aspirin: Secondary | ICD-10-CM | POA: Diagnosis not present

## 2014-05-24 DIAGNOSIS — M549 Dorsalgia, unspecified: Secondary | ICD-10-CM | POA: Diagnosis not present

## 2014-05-24 DIAGNOSIS — R079 Chest pain, unspecified: Secondary | ICD-10-CM

## 2014-05-24 DIAGNOSIS — Y849 Medical procedure, unspecified as the cause of abnormal reaction of the patient, or of later complication, without mention of misadventure at the time of the procedure: Secondary | ICD-10-CM | POA: Diagnosis not present

## 2014-05-24 DIAGNOSIS — G8929 Other chronic pain: Secondary | ICD-10-CM | POA: Diagnosis not present

## 2014-05-24 DIAGNOSIS — I509 Heart failure, unspecified: Secondary | ICD-10-CM | POA: Insufficient documentation

## 2014-05-24 DIAGNOSIS — I252 Old myocardial infarction: Secondary | ICD-10-CM | POA: Insufficient documentation

## 2014-05-24 DIAGNOSIS — I209 Angina pectoris, unspecified: Secondary | ICD-10-CM | POA: Insufficient documentation

## 2014-05-24 DIAGNOSIS — I25718 Atherosclerosis of autologous vein coronary artery bypass graft(s) with other forms of angina pectoris: Secondary | ICD-10-CM

## 2014-05-24 DIAGNOSIS — M129 Arthropathy, unspecified: Secondary | ICD-10-CM | POA: Insufficient documentation

## 2014-05-24 DIAGNOSIS — I25119 Atherosclerotic heart disease of native coronary artery with unspecified angina pectoris: Secondary | ICD-10-CM

## 2014-05-24 DIAGNOSIS — I6529 Occlusion and stenosis of unspecified carotid artery: Secondary | ICD-10-CM | POA: Insufficient documentation

## 2014-05-24 DIAGNOSIS — T82855A Stenosis of coronary artery stent, initial encounter: Secondary | ICD-10-CM

## 2014-05-24 DIAGNOSIS — J4489 Other specified chronic obstructive pulmonary disease: Secondary | ICD-10-CM | POA: Insufficient documentation

## 2014-05-24 HISTORY — DX: Personal history of other medical treatment: Z92.89

## 2014-05-24 HISTORY — DX: Other chronic pain: G89.29

## 2014-05-24 HISTORY — DX: Reserved for concepts with insufficient information to code with codable children: IMO0002

## 2014-05-24 HISTORY — DX: Hypothyroidism, unspecified: E03.9

## 2014-05-24 HISTORY — DX: Unspecified asthma, uncomplicated: J45.909

## 2014-05-24 HISTORY — DX: Anemia, unspecified: D64.9

## 2014-05-24 HISTORY — PX: LEFT HEART CATHETERIZATION WITH CORONARY/GRAFT ANGIOGRAM: SHX5450

## 2014-05-24 HISTORY — DX: Tension-type headache, unspecified, not intractable: G44.209

## 2014-05-24 HISTORY — PX: CORONARY ANGIOPLASTY WITH STENT PLACEMENT: SHX49

## 2014-05-24 HISTORY — DX: Acute kidney failure, unspecified: N17.9

## 2014-05-24 HISTORY — DX: Dorsalgia, unspecified: M54.9

## 2014-05-24 LAB — BASIC METABOLIC PANEL
Anion gap: 13 (ref 5–15)
BUN: 27 mg/dL — ABNORMAL HIGH (ref 6–23)
CO2: 25 meq/L (ref 19–32)
Calcium: 9.3 mg/dL (ref 8.4–10.5)
Chloride: 102 mEq/L (ref 96–112)
Creatinine, Ser: 0.89 mg/dL (ref 0.50–1.10)
GFR calc Af Amer: 81 mL/min — ABNORMAL LOW (ref >90)
GFR calc non Af Amer: 70 mL/min — ABNORMAL LOW (ref >90)
GLUCOSE: 108 mg/dL — AB (ref 70–99)
POTASSIUM: 4.5 meq/L (ref 3.7–5.3)
SODIUM: 140 meq/L (ref 137–147)

## 2014-05-24 LAB — POCT ACTIVATED CLOTTING TIME
ACTIVATED CLOTTING TIME: 230 s
Activated Clotting Time: 191 seconds
Activated Clotting Time: 247 seconds

## 2014-05-24 LAB — CBC
HCT: 33.7 % — ABNORMAL LOW (ref 36.0–46.0)
HEMOGLOBIN: 11.1 g/dL — AB (ref 12.0–15.0)
MCH: 30 pg (ref 26.0–34.0)
MCHC: 32.9 g/dL (ref 30.0–36.0)
MCV: 91.1 fL (ref 78.0–100.0)
Platelets: 249 10*3/uL (ref 150–400)
RBC: 3.7 MIL/uL — AB (ref 3.87–5.11)
RDW: 13.2 % (ref 11.5–15.5)
WBC: 6.6 10*3/uL (ref 4.0–10.5)

## 2014-05-24 LAB — PROTIME-INR
INR: 1.01 (ref 0.00–1.49)
PROTHROMBIN TIME: 13.3 s (ref 11.6–15.2)

## 2014-05-24 SURGERY — LEFT HEART CATHETERIZATION WITH CORONARY/GRAFT ANGIOGRAM
Anesthesia: LOCAL

## 2014-05-24 MED ORDER — HEPARIN SODIUM (PORCINE) 1000 UNIT/ML IJ SOLN
INTRAMUSCULAR | Status: AC
  Filled 2014-05-24: qty 1

## 2014-05-24 MED ORDER — ALPRAZOLAM 0.25 MG PO TABS
ORAL_TABLET | ORAL | Status: AC
  Filled 2014-05-24: qty 4

## 2014-05-24 MED ORDER — ACETAMINOPHEN 325 MG PO TABS: 650.0000 mg | ORAL_TABLET | ORAL | Status: DC | PRN

## 2014-05-24 MED ORDER — PANTOPRAZOLE SODIUM 40 MG PO TBEC
40.0000 mg | DELAYED_RELEASE_TABLET | Freq: Every day | ORAL | Status: DC
Start: 2014-05-24 — End: 2014-05-25
  Administered 2014-05-24 – 2014-05-25 (×2): 40 mg via ORAL
  Filled 2014-05-24 (×2): qty 1

## 2014-05-24 MED ORDER — FENTANYL CITRATE 0.05 MG/ML IJ SOLN
INTRAMUSCULAR | Status: AC
  Filled 2014-05-24: qty 2

## 2014-05-24 MED ORDER — PRASUGREL HCL 10 MG PO TABS
10.0000 mg | ORAL_TABLET | Freq: Every day | ORAL | Status: DC
  Administered 2014-05-25: 10 mg via ORAL
  Filled 2014-05-24: qty 1

## 2014-05-24 MED ORDER — SODIUM CHLORIDE 0.9 % IV SOLN: 250.0000 mL | INTRAVENOUS | Status: DC | PRN

## 2014-05-24 MED ORDER — ONDANSETRON HCL 4 MG/2ML IJ SOLN: 4.0000 mg | Freq: Four times a day (QID) | INTRAMUSCULAR | Status: DC | PRN

## 2014-05-24 MED ORDER — ASPIRIN 81 MG PO CHEW: 81.0000 mg | CHEWABLE_TABLET | ORAL | Status: DC

## 2014-05-24 MED ORDER — AMLODIPINE BESYLATE 10 MG PO TABS
10.0000 mg | ORAL_TABLET | Freq: Every day | ORAL | Status: DC
  Administered 2014-05-25: 10 mg via ORAL
  Filled 2014-05-24: qty 1

## 2014-05-24 MED ORDER — VERAPAMIL HCL 2.5 MG/ML IV SOLN
INTRAVENOUS | Status: DC
Start: 2014-05-24 — End: 2014-05-24
  Filled 2014-05-24: qty 2

## 2014-05-24 MED ORDER — SODIUM CHLORIDE 0.9 % IV SOLN: 1.0000 mL/kg/h | INTRAVENOUS | Status: AC

## 2014-05-24 MED ORDER — ALBUTEROL SULFATE HFA 108 (90 BASE) MCG/ACT IN AERS: 2.0000 | INHALATION_SPRAY | Freq: Four times a day (QID) | RESPIRATORY_TRACT | Status: DC | PRN

## 2014-05-24 MED ORDER — HYDROCODONE-ACETAMINOPHEN 10-325 MG PO TABS
1.0000 | ORAL_TABLET | ORAL | Status: DC | PRN
  Administered 2014-05-24: 1 via ORAL
  Filled 2014-05-24: qty 1

## 2014-05-24 MED ORDER — SODIUM CHLORIDE 0.9 % IJ SOLN: 3.0000 mL | Freq: Two times a day (BID) | INTRAMUSCULAR | Status: DC

## 2014-05-24 MED ORDER — MIDAZOLAM HCL 2 MG/2ML IJ SOLN
INTRAMUSCULAR | Status: AC
  Filled 2014-05-24: qty 2

## 2014-05-24 MED ORDER — ALPRAZOLAM 0.25 MG PO TABS
1.0000 mg | ORAL_TABLET | Freq: Three times a day (TID) | ORAL | Status: DC | PRN
  Administered 2014-05-24: 1 mg via ORAL

## 2014-05-24 MED ORDER — LEVOTHYROXINE SODIUM 75 MCG PO TABS
75.0000 ug | ORAL_TABLET | Freq: Every day | ORAL | Status: DC
  Administered 2014-05-25: 75 ug via ORAL
  Filled 2014-05-24 (×2): qty 1

## 2014-05-24 MED ORDER — ASPIRIN EC 81 MG PO TBEC
81.0000 mg | DELAYED_RELEASE_TABLET | Freq: Every day | ORAL | Status: DC
  Administered 2014-05-25: 11:00:00 81 mg via ORAL
  Filled 2014-05-24: qty 1

## 2014-05-24 MED ORDER — ALBUTEROL SULFATE (2.5 MG/3ML) 0.083% IN NEBU: 2.5000 mg | INHALATION_SOLUTION | Freq: Four times a day (QID) | RESPIRATORY_TRACT | Status: DC | PRN

## 2014-05-24 MED ORDER — HEPARIN (PORCINE) IN NACL 2-0.9 UNIT/ML-% IJ SOLN
INTRAMUSCULAR | Status: AC
  Filled 2014-05-24: qty 1000

## 2014-05-24 MED ORDER — NITROGLYCERIN 1 MG/10 ML FOR IR/CATH LAB
INTRA_ARTERIAL | Status: AC
  Filled 2014-05-24: qty 10

## 2014-05-24 MED ORDER — NITROGLYCERIN 0.4 MG SL SUBL: 0.4000 mg | SUBLINGUAL_TABLET | SUBLINGUAL | Status: DC | PRN

## 2014-05-24 MED ORDER — SODIUM CHLORIDE 0.9 % IJ SOLN
3.0000 mL | INTRAMUSCULAR | Status: DC | PRN
Start: 2014-05-24 — End: 2014-05-24

## 2014-05-24 MED ORDER — ZOLPIDEM TARTRATE 5 MG PO TABS
5.0000 mg | ORAL_TABLET | Freq: Every evening | ORAL | Status: DC | PRN
  Administered 2014-05-24: 21:00:00 5 mg via ORAL
  Filled 2014-05-24: qty 1

## 2014-05-24 MED ORDER — MORPHINE SULFATE 2 MG/ML IJ SOLN: 2.0000 mg | INTRAMUSCULAR | Status: DC | PRN

## 2014-05-24 MED ORDER — ZOLPIDEM TARTRATE 5 MG PO TABS: 10.0000 mg | ORAL_TABLET | Freq: Every evening | ORAL | Status: DC | PRN

## 2014-05-24 MED ORDER — FUROSEMIDE 80 MG PO TABS
80.0000 mg | ORAL_TABLET | Freq: Every day | ORAL | Status: DC | PRN
Start: 2014-05-24 — End: 2014-05-25
  Filled 2014-05-24: qty 1

## 2014-05-24 MED ORDER — BENAZEPRIL HCL 20 MG PO TABS
20.0000 mg | ORAL_TABLET | Freq: Every day | ORAL | Status: DC
  Administered 2014-05-25: 11:00:00 20 mg via ORAL
  Filled 2014-05-24: qty 1

## 2014-05-24 MED ORDER — ALPRAZOLAM 0.5 MG PO TABS
1.0000 mg | ORAL_TABLET | Freq: Four times a day (QID) | ORAL | Status: DC | PRN
  Administered 2014-05-24: 1 mg via ORAL
  Filled 2014-05-24 (×2): qty 2

## 2014-05-24 MED ORDER — HYDROCODONE-ACETAMINOPHEN 10-325 MG PO TABS
1.0000 | ORAL_TABLET | Freq: Four times a day (QID) | ORAL | Status: DC | PRN
  Administered 2014-05-24: 1 via ORAL
  Filled 2014-05-24: qty 1

## 2014-05-24 MED ORDER — LIDOCAINE HCL (PF) 1 % IJ SOLN
INTRAMUSCULAR | Status: AC
  Filled 2014-05-24: qty 30

## 2014-05-24 MED ORDER — METOPROLOL SUCCINATE ER 50 MG PO TB24
50.0000 mg | ORAL_TABLET | Freq: Every day | ORAL | Status: DC
  Administered 2014-05-25: 50 mg via ORAL
  Filled 2014-05-24: qty 1

## 2014-05-24 MED ORDER — SODIUM CHLORIDE 0.9 % IV SOLN
INTRAVENOUS | Status: DC
  Administered 2014-05-24: 07:00:00 via INTRAVENOUS

## 2014-05-24 NOTE — H&P (View-Only) (Signed)
   Clinical Summary Ms. Diedrich is a 58 y.o.female seen today for follow up of the following medical problems.   1. CAD  - CABG 2005 4 vessel  - history of prior MI, prior stents remotely.  - 09/2013 MPI large inferolateral scar with no ischemia, LVEF 50%.  - around time of her stress test she had been having some chest pain. She responded well to addition of norvasc at that time, and given lack of ischemia on MPI was managed medically  - reports symptoms of exertional chest pain that have worsened over the last 3 months. Feeling pressure in midchest 8/10, + diaphoresis. + SOB. Occurs with low levels activity like making bed, sweeping, or mopping. Increased DOE with activities.Walks around block [place], gets SOB and pain when reaches incline. When stops pain resolves. Pain better with NG.  2. Carotid stenosis  - carotid US 02/2012: extensive bilateral plaque w/ 50-69% stenosis bilaterally  - denies any neuro symptoms   3. HTN  - compliant meds   4. Tobacco  - quit few months ago - 5. Hyperlipidemia  - compliant with statin  - 03/2014 lipid panel: TC 174 TG 293 HDL 36 LDL 79 - tried to change to crestor last visit, however she reports severe fatigue. Has only been able to tolerate lovastatin which she is back on .    Past Medical History  Diagnosis Date  . Coronary artery disease   . Hypertension   . COPD (chronic obstructive pulmonary disease)   . Hyperlipidemia   . GERD (gastroesophageal reflux disease)   . Seizure disorder   . Chronic low back pain   . Kidney stones   . Diverticulitis     4 documented episodes by CT; most recent April 2012  . MI (myocardial infarction) Nov 2011    also 2005  . Sleep apnea     has been diagnosed but didn't follow up to get CPAP  . CHF (congestive heart failure)   . Depression   . Anxiety   . Shortness of breath   . Pneumonia   . Seizures   . Arthritis      Allergies  Allergen Reactions  . Lipitor [Atorvastatin]  Other (See Comments)    Cramps  . Morphine And Related Itching     Current Outpatient Prescriptions  Medication Sig Dispense Refill  . albuterol (PROAIR HFA) 108 (90 BASE) MCG/ACT inhaler Inhale 2 puffs into the lungs every 6 (six) hours as needed for wheezing or shortness of breath.      . ALPRAZolam (XANAX) 1 MG tablet Take 1 mg by mouth 3 (three) times daily as needed for anxiety or sleep.       . amLODipine (NORVASC) 5 MG tablet TAKE ONE TABLET BY MOUTH ONCE DAILY.  30 tablet  6  . aspirin 81 MG tablet Take 1 tablet (81 mg total) by mouth every morning.      . benazepril (LOTENSIN) 20 MG tablet TAKE (1) TABLET BY MOUTH EACH MORNING.  30 tablet  6  . EFFIENT 10 MG TABS tablet TAKE (1) TABLET BY MOUTH EACH MORNING.  30 tablet  6  . FLUoxetine (PROZAC) 20 MG tablet Take 20 mg by mouth every morning.       . furosemide (LASIX) 80 MG tablet TAKE ONE TABLET DAILY. CALL OFFICE AND MAKE AN APPOINTMENT.  90 tablet  3  . HYDROcodone-acetaminophen (NORCO) 10-325 MG per tablet Take 1 tablet by mouth every 6 (six) hours   as needed. Pain      . levothyroxine (SYNTHROID, LEVOTHROID) 75 MCG tablet Take 75 mcg by mouth every morning.       . lovastatin (MEVACOR) 40 MG tablet Take 1 tablet (40 mg total) by mouth at bedtime.  30 tablet  6  . metoprolol succinate (TOPROL XL) 50 MG 24 hr tablet TAKE (1) TABLET BY MOUTH EACH MORNING WITH OR IMMEDIATELY FOLLOWING AMEAL.  30 tablet  6  . metroNIDAZOLE (FLAGYL) 500 MG tablet Take 1 tablet (500 mg total) by mouth 3 (three) times daily.  36 tablet  0  . Multiple Vitamin (MULTIVITAMIN WITH MINERALS) TABS tablet Take 1 tablet by mouth 2 (two) times daily.  25 tablet  4  . NITROSTAT 0.4 MG SL tablet DISSOLVE 1 TABLET UNDER TONGUE EVERY 5 MINUTES UP TO 15 MIN FOR CHESTPAIN. IF NO RELIEF CALL 911.  25 tablet  3  . omeprazole (PRILOSEC) 20 MG capsule Take 1 capsule (20 mg total) by mouth daily.  30 capsule  6  . potassium chloride SA (K-DUR,KLOR-CON) 20 MEQ tablet TAKE  (1) TABLET BY MOUTH EACH MORNING.  30 tablet  6  . potassium chloride SA (K-DUR,KLOR-CON) 20 MEQ tablet Take 1 tablet (20 mEq total) by mouth daily as needed (Only takes when she takes the Furosemide).  30 tablet  0  . rosuvastatin (CRESTOR) 5 MG tablet Take 1 tablet (5 mg total) by mouth daily.  90 tablet  3  . [DISCONTINUED] carbamazepine (TEGRETOL XR) 200 MG 12 hr tablet Take 200 mg by mouth 2 (two) times daily.        . [DISCONTINUED] lamoTRIgine (LAMICTAL) 100 MG tablet Take 100 mg by mouth 2 (two) times daily.         No current facility-administered medications for this visit.     Past Surgical History  Procedure Laterality Date  . Coronary artery bypass graft  2005    LIMA to AL, SVG to LAD, SVG to Cx, SVG-PDA  . Carotid endarterectomy  2005  . Left knee repair    . Right shoulder and elbow repair    . Abdominal hysterectomy    . Coronary angioplasty with stent placement  2011  . Cardiac catheterization  Jan 2012    patent stents in RCA graft; CAD stable     Allergies  Allergen Reactions  . Lipitor [Atorvastatin] Other (See Comments)    Cramps  . Morphine And Related Itching      Family History  Problem Relation Age of Onset  . Heart attack Mother   . Heart disease Mother   . Breast cancer Mother   . Heart attack Father   . Heart disease Father   . Colon cancer Neg Hx      Social History Ms. Pagel reports that she quit smoking about 4 years ago. Her smoking use included Cigarettes. She has a 22.5 pack-year smoking history. She has never used smokeless tobacco. Ms. Cude reports that she does not drink alcohol.   Review of Systems CONSTITUTIONAL: No weight loss, fever, chills, weakness or fatigue.  HEENT: Eyes: No visual loss, blurred vision, double vision or yellow sclerae.No hearing loss, sneezing, congestion, runny nose or sore throat.  SKIN: No rash or itching.  CARDIOVASCULAR: per HPI RESPIRATORY: No shortness of breath, cough or sputum.    GASTROINTESTINAL: No anorexia, nausea, vomiting or diarrhea. No abdominal pain or blood.  GENITOURINARY: No burning on urination, no polyuria NEUROLOGICAL: No headache, dizziness, syncope, paralysis, ataxia, numbness or   tingling in the extremities. No change in bowel or bladder control.  MUSCULOSKELETAL: No muscle, back pain, joint pain or stiffness.  LYMPHATICS: No enlarged nodes. No history of splenectomy.  PSYCHIATRIC: No history of depression or anxiety.  ENDOCRINOLOGIC: No reports of sweating, cold or heat intolerance. No polyuria or polydipsia.  .   Physical Examination p 75 bp 112/62 Wt 238 lbs BMI 41 Gen: resting comfortably, no acute distress HEENT: no scleral icterus, pupils equal round and reactive, no palptable cervical adenopathy,  CV: RRR, 2/6 sysotlic murmur RUSB, no JVD, no carotid bruits Resp: Clear to auscultation bilaterally GI: abdomen is soft, non-tender, non-distended, normal bowel sounds, no hepatosplenomegaly MSK: extremities are warm, no edema.  Skin: warm, no rash Neuro:  no focal deficits Psych: appropriate affect   Diagnostic Studies Cath 2012  Left main coronary was normal.  Left anterior descending artery was occluded just after the septal  perforator. First diagonal Towanda Hornstein was subtotally occluded. There was  some filling of the distal LAD and diagonals from collateral branches  from the LIMA. Circumflex coronary artery was occluded in the  midvessel. The AV groove Quincey Nored was patent. There was a small first  obtuse marginal Latrica Clowers with 30-40% multiple lesions. There was a small  second obtuse marginal Mekiyah Gladwell with 30-40% discrete lesions. There is a  very large obtuse marginal Alexios Keown seen filling from collaterals from the  left internal mammary artery.  Right coronary artery was 100% occluded proximally with both left-to-  right and right-to-right collaterals. The distal PDA also filled from  the vein graft to the right coronary artery.  The left  internal mammary artery was widely patent. It also filled the  distal right large obtuse marginal Phylicia Mcgaugh and filled retrograde to a  smaller diagonal Jolette Lana.  There was known occlusion of the 2 left-sided vein grafts. The  saphenous vein graft to the right coronary artery was widely patent.  Bare-metal stent in the mid-to-distal vessel widely patent. There was  poor runoff to the vessel primarily being retrograde into the posterior  lateral Desmon Hitchner, however, there was no new focal stenosis.  RAO ventriculography. RAO ventriculography showed anterior apical and  mid inferior wall hypokinesis. EF was in the 40% range.  Right heart catheterization showed mean pulmonary capillary wedge  pressure of 24, PA pressure was 55/26, RV pressure was 52/5, RA pressure  mean was 16, aortic pressure was 155/76, LV pressure was equal to 167/16  with a post A-wave EDP of 26.  Cardiac output was 6.3 liters per minute with a cardiac index of 3  liters per minute per meter squared by Fick.   IMPRESSION: The patient's coronary artery disease is stable. The  stents in the RCA graft were patent. There are no new lesions that I  can see. She does have collateralized distal right and OM Lakeesha Fontanilla.  Continued medical therapy is warranted. If she continues to have chest  pain, Ranexa may be in order since it is somewhat late in the day and  she had both venous and arterial sheath. She will be kept overnight and  discharged in the morning. She tolerated the procedure well.   03/2014 Echo  Study Conclusions  - Left ventricle: The cavity size was normal. Wall thickness was increased in a pattern of mild LVH. Systolic function was normal. The estimated ejection fraction was in the range of 50% to 55%. There is akinesis of the basalinferolateral myocardium. Features are consistent with a pseudonormal left ventricular filling pattern, with concomitant   abnormal relaxation and increased filling pressure (grade 2 diastolic  dysfunction). - Aortic valve: Mildly calcified annulus. Trileaflet. There was no significant regurgitation. - Mitral valve: There was trivial regurgitation. - Left atrium: The atrium was mildly to moderately dilated. - Right ventricle: Systolic function was mildly reduced. - Right atrium: Central venous pressure (est): 3 mm Hg. - Atrial septum: No defect or patent foramen ovale was identified. - Tricuspid valve: There was trivial regurgitation. - Pulmonary arteries: Systolic pressure could not be accurately estimated. - Pericardium, extracardiac: There was no pericardial effusion.  Impressions:  - Mild LVH with LVEF 50-55%, basal inferolateral hypokinesis, grade 2 diastolic dysfunction. Mild to moderate left atrial enlargement. Mildly reduced RV contraction. Unable to assess PASP.  03/2014 Carotid US  IMPRESSION:  Bilateral 50-69% stenosis. The left internal carotid peak systolic  velocity has increased slightly in the interval from the prior exam.     Assessment and Plan  1. CAD  - progression of exertional chest pain concerning for angina despite being on 2 antianginals. Will refer for LHC. She has been on long term effient, will continue at this time.   2. Carotid stenosis  - 03/2014 carotid US with moderate bilateral disease  - continue to follow   3. HTN  - at goal, continue current meds   4. Tobacco  - she has succesfully stopped smoking   5. Hyperlipidemia  - has not tolerated high dose statins, continue lovastatin     F/u pending cath results    Fabrice Dyal F. Maddalyn Lutze, M.D., F.A.C.C.  

## 2014-05-24 NOTE — CV Procedure (Signed)
CARDIAC CATHETERIZATION AND PERCUTANEOUS CORONARY INTERVENTION REPORT  NAME:  LUTICIA TADROS   MRN: [id number] DOB:  08/15/1956   ADMIT DATE: 05/24/2014 Procedure Date: 05/24/2014  INTERVENTIONAL CARDIOLOGIST: Leonie Man, M.D., MS PRIMARY CARE PROVIDER: Alonza Bogus, MD PRIMARY CARDIOLOGIST: Carlyle Dolly, M.D.   PATIENT:  [name] is a 58 y.o. female with known history of CAD status post four-vessel CABG in 2005 after she was found to have multivessel CAD. She had a LIMA to diagonal since the LAD was diminutive with her they got to the LAD as well as vein graft to the RPDA and OM. Incessantly but don't have the occlusion of the vein graft to both the OM and LAD with patent LIMA. She's had initially been all stents to the RCA that had in-stent thrombosis/stenosis treated with a drug-eluting stent several months later in 2011. The stent was a 35 mm stent postdilated to 4.0 mm. She was seen by Dr. Harl Bowie on August 18 noting symptoms of worsening exertional chest tightness and dyspnea consistent with class III angina. She is now referred for invasive evaluation with cardiac catheterization and native/graft angiography +/- PCI.  PRE-OPERATIVE DIAGNOSIS:    Class III Angina  Known severe native and graft CAD  Status post PCI x2 to the SVG-RCA  PROCEDURES PERFORMED:    Left Heart Catheterization with Native Coronary And Graft Angiography  via Left Radial Artery   Left Ventriculography  PROCEDURE: The patient was brought to the [facility] in the fasting state and prepped and draped in the usual sterile fashion for Left Radial artery access. A modified Allen's test was performed on the left wrist demonstrating excellent collateral flow for radial access.   Sterile technique was used including antiseptics, cap, gloves, gown, hand hygiene, mask and sheet. Skin prep: Chlorhexidine.   Consent: Risks of procedure as well as the alternatives and  risks of each were explained to the (patient/caregiver). Consent for procedure obtained.   Time Out: Verified patient identification, verified procedure, site/side was marked, verified correct patient position, special equipment/implants available, medications/allergies/relevent history reviewed, required imaging and test results available. Performed.  Access:   Left Radial Artery: 6 Fr Sheath -  Seldinger Technique (Angiocath Micropuncture Kit)  Radial Cocktail - 10 mL; IV Heparin 5500 Units   Left Heart Catheterization: 5 Fr Catheters advanced initially over Versicore wire and exchanged over a long exchange J-wire; JR 4 catheter advanced first.  Right Coronary Artery, SVG-RCA & SVG-OM Cineangiography: JR 4 Catheter  Left Coronary Artery Cineangiography: JL4 Catheter  LIMA-LAD Cineangiography: 4 Catheter redirected into Left Subclavian Artery & exchanged over long-exchange wire for IMA catheter.  LV Hemodynamics (LV Gram): Angled pigtail  Sheath removed in the TR band placement for nonocclusive hemostasis.  TR Band: 1040  Hours; 17 mL air  FINDINGS:  Hemodynamics:   Central Aortic Pressure / Mean: 110/62/81 mmHg  Left Ventricular Pressure / LVEDP: 110/14/21 mmHg  Left Ventriculography:  EF: 50 at 55 %  Wall Motion: Severe hypokinesis of the Basal to MID inferior-inferolateraL wall with mild hypokinesis of the basal anterior wall  Coronary Anatomy:  Dominance: Right  Left Main: Normal caliber vessel that itself is free of significant disease. Bifurcates into the LAD and Circumflex LAD: Moderate caliber vessel that is 100% occluded after a septal perforator and small diagonal branch. There is a first diagonal branch that is also half percent occluded after initial subtotal occlusion.  Left Circumflex: Moderate caliber vessel it gives rise  to a proximal small caliber OM branch that is somewhat tortuous and free of significant disease. The vessel then bifurcates into the  introducer complex which is small in caliber and perfuses the basal inferolateral wall. There is a major lateral OM branch the visualized the proximal segment is occluded. There is late retrograde filling of the remainder the nature OM which appears to have 2 branches distally. This is faint collaterals from the proximal OM.   RCA: 100% proximal occluded.  SVG-RPDA: Large-caliber graft with mildly irregular is proximally, there are overlapping stents in the distal mid graft with 99% napkin ring proximal in-stent restenosis of the most proximal stent that extends just minimally to the unstented segment. The remainder of the graft is relatively free of disease and attached to the mid RPDA.  RPDA: Large-caliber tortuous vessel that reaches almost to the apex.  RPL Sysytem:The RPAV fills via retrograde flow in the RPDA. There are several small posterolateral branches that are relatively free of disease. The PAV is relatively free of disease.  After reviewing the initial angiography, the culprit lesion was thought to be the 99% proximal stent ISR in the SVG-RPDA.  Preparation were made to proceed with PCI on this lesion.  As the lesion is focal & appears to be fibrous in nature and too stenosed to safely pass a filter wire, the decision was made to pre-dilate & stent without distal protection.  No evidence of a filling defect was noted besides the "napkin-ring" lesion.  Percutaneous Coronary Intervention:  Sheath exchanged for 6 Fr Guide: 6 Fr   AL1 Guidewire: BMW (after failed attempt with Pro-Water Predilation Balloon: Angioscult Cutting Balloon 3.5 mm x 10 mm;   12 Atm x 45 Sec,-- excellent predilation of the fibrous lesion at high pressure. Brisk TIMI-3 flow down stream but no sign of no reflow. The decision was made to proceed with stenting. Stent: Promus Premier DES 3.5 mm x 12 mm; overlaps roughly 4 mm into the original stent.  Deployed at: 20 Atm x 30 Sec Post-dilation Balloon: Straughn Emerge 4.0  mm x 8 mm;   14 Atm x 30 Sec x 2 -   Final Diameter: 4.1 mm  Post deployment angiography in multiple views, with and without guidewire in place revealed excellent stent deployment and lesion coverage.  There was no evidence of dissection or perforation. Brisk TIMI-3 flow is noted in the downstream vessel  MEDICATIONS:  Anesthesia:  Local Lidocaine 2 ml  Sedation:  4 mg IV Versed, 100 mcg IV fentanyl ;   Omnipaque Contrast: 180 ml  Anticoagulation:  IV Heparin 13500 Units total   Anti-Platelet Agent:  Effient and aspirin as standing home medication  PATIENT DISPOSITION:    The patient was transferred to the PACU holding area in a hemodynamicaly stable, chest pain free condition.  The patient tolerated the procedure well, and there were no complications.  EBL:   < 5 ml  The patient was stable before, during, and after the procedure.  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  Known occlusion of the SVG-LAD  and SVG-OM  Relatively preserved LVEF as noted on the cardiogram recently. There is known hypokinesis to akinesis of the nasal to mid inferior inferolateral wall as well as mild hypokinesis of the basal anterior wall assistant with known CAD.  PLAN OF CARE:  Overnight observation post-PCI. Continue home doses of aspirin plus Effient.  Standard post radial cath care with care being removal   Discharge in the morning if stable.  Followup with Dr. Harl Bowie - continue to optimize medical management of existing severe CAD.   Leonie Man, M.D., M.S. [facility] [address] [place], [place]  [address]  [phone]  05/24/2014 10:47 AM

## 2014-05-24 NOTE — Interval H&P Note (Signed)
History and Physical Interval Note:  05/24/2014 8:55 AM  Toni BeaversPatricia C [name]  has presented today for surgery, with the diagnosis of class III angina  Principal Problem:   Angina, class III - progressed despite medical therapy Active Problems:   Atherosclerotic heart disease of native coronary artery with angina pectoris   Coronary artery disease involving autologous vein coronary bypass graft with other forms of angina pectoris   Hyperlipidemia with target LDL less than 70   Essential hypertension    The various methods of treatment have been discussed with the patient and family. After consideration of risks, benefits and other options for treatment, the patient has consented to  Procedure(s): LEFT HEART CATHETERIZATION WITH CORONARY/GRAFT ANGIOGRAM (N/A) +/- PCI as a surgical intervention .  The patient's history has been reviewed, patient examined, no change in status, stable for surgery.  I have reviewed the patient's chart and labs.  Questions were answered to the patient's satisfaction.     [name]  Cath Lab Visit (complete for each Cath Lab visit)  Clinical Evaluation Leading to the Procedure:   ACS: No.  Non-ACS:    Anginal Classification: CCS III  Anti-ischemic medical therapy: Maximal Therapy (2 or more classes of medications)  Non-Invasive Test Results: No non-invasive testing performed  Prior CABG: Previous CABG

## 2014-05-25 ENCOUNTER — Encounter (HOSPITAL_COMMUNITY): Payer: Self-pay | Admitting: Physician Assistant

## 2014-05-25 DIAGNOSIS — I209 Angina pectoris, unspecified: Secondary | ICD-10-CM | POA: Diagnosis not present

## 2014-05-25 DIAGNOSIS — I25810 Atherosclerosis of coronary artery bypass graft(s) without angina pectoris: Secondary | ICD-10-CM | POA: Diagnosis not present

## 2014-05-25 DIAGNOSIS — E785 Hyperlipidemia, unspecified: Secondary | ICD-10-CM | POA: Diagnosis not present

## 2014-05-25 DIAGNOSIS — I2510 Atherosclerotic heart disease of native coronary artery without angina pectoris: Secondary | ICD-10-CM | POA: Diagnosis not present

## 2014-05-25 LAB — CBC
HEMATOCRIT: 33.9 % — AB (ref 36.0–46.0)
Hemoglobin: 11.3 g/dL — ABNORMAL LOW (ref 12.0–15.0)
MCH: 30.4 pg (ref 26.0–34.0)
MCHC: 33.3 g/dL (ref 30.0–36.0)
MCV: 91.1 fL (ref 78.0–100.0)
Platelets: 235 10*3/uL (ref 150–400)
RBC: 3.72 MIL/uL — ABNORMAL LOW (ref 3.87–5.11)
RDW: 13.2 % (ref 11.5–15.5)
WBC: 6.1 10*3/uL (ref 4.0–10.5)

## 2014-05-25 LAB — BASIC METABOLIC PANEL
ANION GAP: 10 (ref 5–15)
BUN: 19 mg/dL (ref 6–23)
CALCIUM: 9.4 mg/dL (ref 8.4–10.5)
CO2: 28 meq/L (ref 19–32)
CREATININE: 0.9 mg/dL (ref 0.50–1.10)
Chloride: 104 mEq/L (ref 96–112)
GFR calc non Af Amer: 69 mL/min — ABNORMAL LOW (ref >90)
GFR, EST AFRICAN AMERICAN: 80 mL/min — AB (ref >90)
Glucose, Bld: 104 mg/dL — ABNORMAL HIGH (ref 70–99)
Potassium: 4.6 mEq/L (ref 3.7–5.3)
Sodium: 142 mEq/L (ref 137–147)

## 2014-05-25 MED ORDER — PRASUGREL HCL 10 MG PO TABS
10.0000 mg | ORAL_TABLET | Freq: Every day | ORAL | Status: DC
Start: 2014-05-25 — End: 2015-05-16

## 2014-05-25 NOTE — Progress Notes (Signed)
TR BAND REMOVAL  LOCATION:    left radial  DEFLATED PER PROTOCOL:    Yes.    TIME BAND OFF / DRESSING APPLIED:    20:00   SITE UPON ARRIVAL:    Level 1  SITE AFTER BAND REMOVAL:    Level 1  REVERSE ALLEN'S TEST:     positive  CIRCULATION SENSATION AND MOVEMENT:    Within Normal Limits   Yes.    COMMENTS:   Pt tolerated removal of TR band without complications, will continue to monitor patient,

## 2014-05-25 NOTE — Care Management Note (Addendum)
  Page 1 of 1   05/25/2014     12:09:01 PM CARE MANAGEMENT NOTE 05/25/2014  Patient:  [name],[name]   Account Number:  [id number]  Date Initiated:  05/25/2014  Documentation initiated by:  Donato SchultzHUTCHINSON,Alyza Artiaga  Subjective/Objective Assessment:   CP     Action/Plan:   CM to follow for dispostion needs   Anticipated DC Date:  05/25/2014   Anticipated DC Plan:  HOME/SELF CARE      DC Planning Services  CM consult  Medication Assistance      Choice offered to / List presented to:             Status of service:  Completed, signed off Medicare Important Message given?   (If response is "NO", the following Medicare IM given date fields will be blank) Date Medicare IM given:   Medicare IM given by:   Date Additional Medicare IM given:   Additional Medicare IM given by:    Discharge Disposition:  HOME/SELF CARE  Per UR Regulation:    If discussed at Long Length of Stay Meetings, dates discussed:    Comments:  Nika Yazzie RN, BSN, MSHL, CCM  Nurse - Case Manager, (Unit [phone])  [phone]  05/25/2014 Benefits Check: prasugrel (EFFIENT) tablet 10 mg  :  Dose 10 mg  :  Oral  : Daily PT HAS MEDICAID COPAY $3 [facility] [address], [place], [place] [id number] [phone] CM contacted pharmacy to confirm 10 mg tabs available. Disposition Plan:  Home / Self care.

## 2014-05-25 NOTE — Discharge Summary (Signed)
CARDIOLOGY DISCHARGE SUMMARY   Patient ID: [name] MRN: [id number] DOB/AGE: January 14, 1956 58 y.o.  Admit date: 05/24/2014 Discharge date: 05/25/2014  PCP: Fredirick MaudlinHAWKINS,EDWARD L, MD Primary Cardiologist: Dr. Wyline MoodBranch  Primary Discharge Diagnosis:   Angina, class III - progressed despite medical therapy - s/p Promus Premier DES, 3.5 mm x 12 mm, for 99% proximal stent ISR in the SVG-RPDA  Secondary Discharge Diagnosis:    Hyperlipidemia with target LDL less than 70   Essential hypertension   Atherosclerotic heart disease of native coronary artery with angina pectoris   Coronary artery disease involving autologous vein coronary bypass graft with other forms of angina pectoris  Procedures: Left Heart Catheterization with Native Coronary And Graft Angiography via Left Radial Artery, Left Ventriculography   Hospital Course: Toni Beaversatricia C Moorehead is a 58 y.o. female with a long history of CAD. Her bypass surgery was in 2005, her last stent was in 2011. She was seen in the office for increasing anginal symptoms and scheduled for cardiac catheterization. She came to the hospital for the procedure on 08/20.  The cardiac catheterization results are below. The culprit lesion was felt to be a 99% stenosis in the SVG-PDA. This was treated with a drug-eluting stent, reducing the stenosis to 0. She tolerated the procedure well.  On 08/21, she was seen by Dr. Herbie BaltimoreHarding and by cardiac rehabilitation. She was educated on heart-healthy lifestyle modifications, exercise guidelines and post-cath/stent restrictions. Dr. Herbie BaltimoreHarding reviewed all data. She was ambulating without chest pain or shortness of breath. No further inpatient workup is indicated and she is considered stable for discharge, to follow up in [place].  General: Well developed, well nourished, female in no acute distress Head: Eyes PERRLA, No xanthomas.   Normocephalic and atraumatic  Lungs: Clear bilaterally to auscultation. Heart: HRRR S1 S2,  without MRG.  Pulses are 2+ & equal. No JVD. Abdomen: Bowel sounds are present, abdomen soft and non-tender without masses or  hernias noted. Msk: Normal strength and tone for age. Extremities: No clubbing, cyanosis or edema. Left radial cath site with minimal ecchymosis, no hematoma.    Skin:  No rashes or lesions noted. Neuro: Alert and oriented X 3. Psych:  Good affect, responds appropriately   Labs:   Lab Results  Component Value Date   WBC 6.1 05/25/2014   HGB 11.3* 05/25/2014   HCT 33.9* 05/25/2014   MCV 91.1 05/25/2014   PLT 235 05/25/2014     Recent Labs Lab 05/25/14 0452  NA 142  K 4.6  CL 104  CO2 28  BUN 19  CREATININE 0.90  CALCIUM 9.4  GLUCOSE 104*    Recent Labs  05/24/14 0654  INR 1.01    Cardiac Cath: 05/24/2014 Left Ventriculography:  EF: 50 at 55 %  Wall Motion: Severe hypokinesis of the Basal to MID inferior-inferolateraL wall with mild hypokinesis of the basal anterior wall Coronary Anatomy:  Dominance: Right Left Main: Normal caliber vessel that itself is free of significant disease. Bifurcates into the LAD and Circumflex LAD: Moderate caliber vessel that is 100% occluded after a septal perforator and small diagonal branch. There is a first diagonal branch that is also half percent occluded after initial subtotal occlusion.  Left Circumflex: Moderate caliber vessel it gives rise to a proximal small caliber OM branch that is somewhat tortuous and free of significant disease. The vessel then bifurcates into the introducer complex which is small in caliber and perfuses the basal inferolateral wall. There is a major lateral  OM branch the visualized the proximal segment is occluded. There is late retrograde filling of the remainder the natural OM which appears to have 2 branches distally. Faint collaterals seen from the proximal OM.  RCA: 100% proximal occluded.  SVG-RPDA: Large-caliber graft with mildly irregular is proximally, there are overlapping stents  in the distal mid graft with 99% napkin ring proximal in-stent restenosis of the most proximal stent that extends just minimally to the unstented segment. The remainder of the graft is relatively free of disease and attached to the mid RPDA.  RPDA: Large-caliber tortuous vessel that reaches almost to the apex.  RPL Sysytem:The RPAV fills via retrograde flow in the RPDA. There are several small posterolateral branches that are relatively free of disease. The PAV is relatively free of disease. After reviewing the initial angiography, the culprit lesion was thought to be the 99% proximal stent ISR in the SVG-RPDA. Preparation were made to proceed with PCI on this lesion. As the lesion is focal & appears to be fibrous in nature and too stenosed to safely pass a filter wire, the decision was made to pre-dilate & stent without distal protection. No evidence of a filling defect was noted besides the "napkin-ring" lesion. 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  Known occlusion of the SVG-LAD and SVG-OM  Relatively preserved LVEF as noted on the cardiogram recently. There is known hypokinesis to akinesis of the nasal to mid inferior inferolateral wall as well as mild hypokinesis of the basal anterior wall assistant with known CAD. PLAN OF CARE:  Overnight observation post-PCI. Continue home doses of aspirin plus Effient.  Standard post radial cath care with care being removal  Discharge in the morning if stable.  Followup with Dr. Wyline Mood - continue to optimize medical management of existing severe  CAD.   EKG: 05/25/2014 SR, no acute ischemic changes Vent. rate 66 BPM PR interval 122 ms QRS duration 94 ms QT/QTc 402/421 ms P-R-T axes 22 21 118  FOLLOW UP PLANS AND APPOINTMENTS Allergies  Allergen Reactions  . Adhesive [Tape] Itching and Other (See Comments)    Blisters (pls use paper tape)  . Crestor [Rosuvastatin] Other (See Comments)    Muscle pain  . Lipitor [Atorvastatin] Other (See Comments)    Cramps  . Morphine And Related Itching and Nausea And Vomiting     Medication List         ALPRAZolam 1 MG tablet  Commonly known as:  XANAX  Take 1 mg by mouth 4 (four) times daily as needed for anxiety or sleep.     amLODipine 10 MG tablet  Commonly known as:  NORVASC  Take 10 mg by mouth daily.     aspirin EC 81 MG tablet  Take 81 mg by mouth daily.     benazepril 20 MG tablet  Commonly known as:  LOTENSIN  Take 20 mg by mouth daily.     furosemide 80 MG tablet  Commonly known as:  LASIX  Take 80 mg by mouth daily as needed for fluid or edema (swelling).     HYDROcodone-acetaminophen  10-325 MG per tablet  Commonly known as:  NORCO  Take 6 tablets by mouth every 6 (six) hours as needed for moderate pain (pain). Take 6 tablets whenever she needs it per patient     levothyroxine 75 MCG tablet  Commonly known as:  SYNTHROID, LEVOTHROID  Take 75 mcg by mouth daily before breakfast.     lovastatin 40 MG tablet  Commonly known as:  MEVACOR  Take 40 mg by mouth daily.     metoprolol succinate 50 MG 24 hr tablet  Commonly known as:  TOPROL-XL  Take 50 mg by mouth daily. Take with or immediately following a meal.     multivitamin with minerals Tabs tablet  Take 1 tablet by mouth 2 (two) times daily.     nitroGLYCERIN 0.4 MG SL tablet  Commonly known as:  NITROSTAT  Place 0.4 mg under the tongue every 5 (five) minutes as needed for chest pain. If no relief in 15 minutes call 911     omeprazole 20 MG capsule  Commonly known as:  PRILOSEC  Take 1 capsule  (20 mg total) by mouth daily.     potassium chloride SA 20 MEQ tablet  Commonly known as:  K-DUR,KLOR-CON  Take 20 mEq by mouth daily as needed (take with lasix (furosemide)). Take when potassium is low     prasugrel 10 MG Tabs tablet  Commonly known as:  EFFIENT  Take 1 tablet (10 mg total) by mouth daily.     albuterol (2.5 MG/3ML) 0.083% nebulizer solution  Commonly known as:  PROVENTIL  Take 2.5 mg by nebulization every 6 (six) hours as needed for wheezing or shortness of breath.     PROAIR HFA 108 (90 BASE) MCG/ACT inhaler  Generic drug:  albuterol  Inhale 2 puffs into the lungs every 6 (six) hours as needed for wheezing or shortness of breath.        Discharge Instructions   Amb Referral to Cardiac Rehabilitation    Complete by:  As directed      Diet - low sodium heart healthy    Complete by:  As directed      Increase activity slowly    Complete by:  As directed           Follow-up Information   Follow up with Joni Reining, NP On 06/08/2014. (See for Dr. Wyline Mood at 1:10 pm.)    Specialty:  Nurse Practitioner   Contact information:   [address] [place]. [place] [address] [phone]       BRING ALL MEDICATIONS WITH YOU TO FOLLOW UP APPOINTMENTS  Time spent with patient to include physician time: 43 min Signed: Theodore Demark, PA-C 05/25/2014, 9:11 AM Co-Sign MD  The patient was seen and examined this morning prior to discharge. She was admitted for observation post-PCI to the [facility] for in-stent restenosis in the setting of class III angina. This is on the left radial approach. She has some mild soreness in the left forearm are otherwise is doing very well post PCI. She was walking early as yesterday afternoon with no further anginal episodes. Her blood pressure are stable overnight she is stable for discharge today.  Continue aggressive medical management per her primary cardiologist.  I agree with the rest the summary  above.   Signed,  Marykay Lex, M.D., M.S. Interventional Cardiologist   Pager # [phone] 05/25/2014

## 2014-05-25 NOTE — Progress Notes (Signed)
CARDIAC REHAB PHASE I   PRE:  Rate/Rhythm: 80 SR    BP: sitting 137/87    SaO2:   MODE:  Ambulation: 1000 ft   POST:  Rate/Rhythm: 105 ST    BP: sitting 159/79     SaO2:   Tolerated well, feels so much better. Pt very positive in thinking today. Has been focusing on diet change, quitting smoking, ex after leaving her husband. Interested in CRPII in [place] and will send rCorintheferral. Feels confident that she will stay quit from cigarettes.  [phone]   Elissa LovettReeve, Nyisha Clippard LoloKristan CES, ACSM 05/25/2014 9:01 AM

## 2014-05-25 NOTE — Discharge Instructions (Signed)
PLEASE REMEMBER TO BRING ALL OF YOUR MEDICATIONS TO EACH OF YOUR FOLLOW-UP OFFICE VISITS. ° °PLEASE ATTEND ALL SCHEDULED FOLLOW-UP APPOINTMENTS.  ° °Activity: Increase activity slowly as tolerated. You may shower, but no soaking baths (or swimming) for 1 week. No driving for 2 days. No lifting over 5 lbs for 1 week. No sexual activity for 1 week.  ° °You May Return to Work: in 1 week (if applicable) ° °Wound Care: You may wash cath site gently with soap and water. Keep cath site clean and dry. If you notice pain, swelling, bleeding or pus at your cath site, please call [phone]. ° ° ° °Cardiac Cath Site Care °Refer to this sheet in the next few weeks. These instructions provide you with information on caring for yourself after your procedure. Your caregiver may also give you more specific instructions. Your treatment has been planned according to current medical practices, but problems sometimes occur. Call your caregiver if you have any problems or questions after your procedure. °HOME CARE INSTRUCTIONS °· You may shower 24 hours after the procedure. Remove the bandage (dressing) and gently wash the site with plain soap and water. Gently pat the site dry.  °· Do not apply powder or lotion to the site.  °· Do not sit in a bathtub, swimming pool, or whirlpool for 5 to 7 days.  °· No bending, squatting, or lifting anything over 10 pounds (4.5 kg) as directed by your caregiver.  °· Inspect the site at least twice daily.  °· Do not drive home if you are discharged the same day of the procedure. Have someone else drive you.  °· You may drive 24 hours after the procedure unless otherwise instructed by your caregiver.  °What to expect: °· Any bruising will usually fade within 1 to 2 weeks.  °· Blood that collects in the tissue (hematoma) may be painful to the touch. It should usually decrease in size and tenderness within 1 to 2 weeks.  °SEEK IMMEDIATE MEDICAL CARE IF: °· You have unusual pain at the site or down the  affected limb.  °· You have redness, warmth, swelling, or pain at the site.  °· You have drainage (other than a small amount of blood on the dressing).  °· You have chills.  °· You have a fever or persistent symptoms for more than 72 hours.  °· You have a fever and your symptoms suddenly get worse.  °· Your leg becomes pale, cool, tingly, or numb.  °· You have heavy bleeding from the site. Hold pressure on the site.  °Document Released: 10/24/2010 Document Revised: 09/10/2011 Document Reviewed: 10/24/2010 °ExitCare® Patient Information ©2012 [employer]. ° °

## 2014-05-28 ENCOUNTER — Emergency Department (HOSPITAL_COMMUNITY): Payer: Medicaid Other

## 2014-05-28 ENCOUNTER — Observation Stay (HOSPITAL_COMMUNITY)
Admission: EM | Admit: 2014-05-28 | Discharge: 2014-05-29 | Disposition: A | Discharge status: Home / Self Care | Payer: Medicaid Other | Attending: Cardiology | Admitting: Cardiology

## 2014-05-28 ENCOUNTER — Encounter (HOSPITAL_COMMUNITY): Payer: Self-pay | Admitting: Emergency Medicine

## 2014-05-28 ENCOUNTER — Telehealth: Payer: Self-pay | Admitting: Physician Assistant

## 2014-05-28 DIAGNOSIS — R110 Nausea: Secondary | ICD-10-CM | POA: Diagnosis not present

## 2014-05-28 DIAGNOSIS — I252 Old myocardial infarction: Secondary | ICD-10-CM | POA: Diagnosis not present

## 2014-05-28 DIAGNOSIS — K5732 Diverticulitis of large intestine without perforation or abscess without bleeding: Secondary | ICD-10-CM | POA: Insufficient documentation

## 2014-05-28 DIAGNOSIS — I509 Heart failure, unspecified: Secondary | ICD-10-CM | POA: Diagnosis not present

## 2014-05-28 DIAGNOSIS — K219 Gastro-esophageal reflux disease without esophagitis: Secondary | ICD-10-CM | POA: Diagnosis not present

## 2014-05-28 DIAGNOSIS — F329 Major depressive disorder, single episode, unspecified: Secondary | ICD-10-CM | POA: Insufficient documentation

## 2014-05-28 DIAGNOSIS — J441 Chronic obstructive pulmonary disease with (acute) exacerbation: Secondary | ICD-10-CM | POA: Insufficient documentation

## 2014-05-28 DIAGNOSIS — Z87442 Personal history of urinary calculi: Secondary | ICD-10-CM | POA: Diagnosis not present

## 2014-05-28 DIAGNOSIS — R61 Generalized hyperhidrosis: Secondary | ICD-10-CM | POA: Diagnosis not present

## 2014-05-28 DIAGNOSIS — Z9861 Coronary angioplasty status: Secondary | ICD-10-CM | POA: Insufficient documentation

## 2014-05-28 DIAGNOSIS — I200 Unstable angina: Secondary | ICD-10-CM | POA: Diagnosis not present

## 2014-05-28 DIAGNOSIS — R5383 Other fatigue: Secondary | ICD-10-CM | POA: Insufficient documentation

## 2014-05-28 DIAGNOSIS — G40909 Epilepsy, unspecified, not intractable, without status epilepticus: Secondary | ICD-10-CM | POA: Diagnosis not present

## 2014-05-28 DIAGNOSIS — IMO0002 Reserved for concepts with insufficient information to code with codable children: Secondary | ICD-10-CM | POA: Insufficient documentation

## 2014-05-28 DIAGNOSIS — G44209 Tension-type headache, unspecified, not intractable: Secondary | ICD-10-CM | POA: Diagnosis not present

## 2014-05-28 DIAGNOSIS — R5381 Other malaise: Secondary | ICD-10-CM | POA: Diagnosis not present

## 2014-05-28 DIAGNOSIS — Z9889 Other specified postprocedural states: Secondary | ICD-10-CM | POA: Diagnosis not present

## 2014-05-28 DIAGNOSIS — I25119 Atherosclerotic heart disease of native coronary artery with unspecified angina pectoris: Secondary | ICD-10-CM

## 2014-05-28 DIAGNOSIS — E785 Hyperlipidemia, unspecified: Secondary | ICD-10-CM | POA: Insufficient documentation

## 2014-05-28 DIAGNOSIS — M129 Arthropathy, unspecified: Secondary | ICD-10-CM | POA: Insufficient documentation

## 2014-05-28 DIAGNOSIS — D649 Anemia, unspecified: Secondary | ICD-10-CM | POA: Diagnosis not present

## 2014-05-28 DIAGNOSIS — I209 Angina pectoris, unspecified: Secondary | ICD-10-CM

## 2014-05-28 DIAGNOSIS — Z951 Presence of aortocoronary bypass graft: Secondary | ICD-10-CM | POA: Diagnosis not present

## 2014-05-28 DIAGNOSIS — Z7982 Long term (current) use of aspirin: Secondary | ICD-10-CM | POA: Insufficient documentation

## 2014-05-28 DIAGNOSIS — Z8701 Personal history of pneumonia (recurrent): Secondary | ICD-10-CM | POA: Diagnosis not present

## 2014-05-28 DIAGNOSIS — R079 Chest pain, unspecified: Secondary | ICD-10-CM | POA: Diagnosis present

## 2014-05-28 DIAGNOSIS — R0789 Other chest pain: Secondary | ICD-10-CM

## 2014-05-28 DIAGNOSIS — I2510 Atherosclerotic heart disease of native coronary artery without angina pectoris: Secondary | ICD-10-CM | POA: Diagnosis not present

## 2014-05-28 DIAGNOSIS — Z79899 Other long term (current) drug therapy: Secondary | ICD-10-CM | POA: Diagnosis not present

## 2014-05-28 DIAGNOSIS — F3289 Other specified depressive episodes: Secondary | ICD-10-CM | POA: Insufficient documentation

## 2014-05-28 DIAGNOSIS — I10 Essential (primary) hypertension: Secondary | ICD-10-CM | POA: Diagnosis not present

## 2014-05-28 DIAGNOSIS — F411 Generalized anxiety disorder: Secondary | ICD-10-CM

## 2014-05-28 DIAGNOSIS — N179 Acute kidney failure, unspecified: Secondary | ICD-10-CM | POA: Diagnosis not present

## 2014-05-28 DIAGNOSIS — Z87891 Personal history of nicotine dependence: Secondary | ICD-10-CM | POA: Insufficient documentation

## 2014-05-28 DIAGNOSIS — E039 Hypothyroidism, unspecified: Secondary | ICD-10-CM | POA: Diagnosis not present

## 2014-05-28 DIAGNOSIS — G4730 Sleep apnea, unspecified: Secondary | ICD-10-CM | POA: Insufficient documentation

## 2014-05-28 DIAGNOSIS — J45901 Unspecified asthma with (acute) exacerbation: Secondary | ICD-10-CM | POA: Insufficient documentation

## 2014-05-28 DIAGNOSIS — G8929 Other chronic pain: Secondary | ICD-10-CM | POA: Insufficient documentation

## 2014-05-28 HISTORY — DX: Disorder of arteries and arterioles, unspecified: I77.9

## 2014-05-28 HISTORY — DX: Peripheral vascular disease, unspecified: I73.9

## 2014-05-28 HISTORY — DX: Bradycardia, unspecified: R00.1

## 2014-05-28 HISTORY — DX: Ischemic cardiomyopathy: I25.5

## 2014-05-28 LAB — TROPONIN I
Troponin I: <0.3 ng/mL (ref <0.30)
Troponin I: <0.3 ng/mL (ref <0.30)

## 2014-05-28 LAB — PROTIME-INR
INR: 1.07 (ref 0.00–1.49)
PROTHROMBIN TIME: 13.9 s (ref 11.6–15.2)

## 2014-05-28 LAB — CBC
HCT: 33.4 % — ABNORMAL LOW (ref 36.0–46.0)
Hemoglobin: 11.4 g/dL — ABNORMAL LOW (ref 12.0–15.0)
MCH: 30.5 pg (ref 26.0–34.0)
MCHC: 34.1 g/dL (ref 30.0–36.0)
MCV: 89.3 fL (ref 78.0–100.0)
PLATELETS: 250 10*3/uL (ref 150–400)
RBC: 3.74 MIL/uL — ABNORMAL LOW (ref 3.87–5.11)
RDW: 12.7 % (ref 11.5–15.5)
WBC: 7.2 10*3/uL (ref 4.0–10.5)

## 2014-05-28 LAB — BASIC METABOLIC PANEL
Anion gap: 14 (ref 5–15)
BUN: 17 mg/dL (ref 6–23)
CO2: 25 mEq/L (ref 19–32)
Calcium: 8.9 mg/dL (ref 8.4–10.5)
Chloride: 100 mEq/L (ref 96–112)
Creatinine, Ser: 1.19 mg/dL — ABNORMAL HIGH (ref 0.50–1.10)
GFR calc non Af Amer: 49 mL/min — ABNORMAL LOW (ref >90)
GFR, EST AFRICAN AMERICAN: 57 mL/min — AB (ref >90)
Glucose, Bld: 97 mg/dL (ref 70–99)
Potassium: 4.6 mEq/L (ref 3.7–5.3)
Sodium: 139 mEq/L (ref 137–147)

## 2014-05-28 LAB — PRO B NATRIURETIC PEPTIDE: Pro B Natriuretic peptide (BNP): 191.9 pg/mL — ABNORMAL HIGH (ref 0–125)

## 2014-05-28 LAB — I-STAT TROPONIN, ED: TROPONIN I, POC: 0 ng/mL (ref 0.00–0.08)

## 2014-05-28 LAB — APTT: aPTT: 31 seconds (ref 24–37)

## 2014-05-28 MED ORDER — HYDROCODONE-ACETAMINOPHEN 10-325 MG PO TABS
1.0000 | ORAL_TABLET | ORAL | Status: DC | PRN
  Administered 2014-05-29: 1 via ORAL
  Filled 2014-05-28 (×2): qty 1

## 2014-05-28 MED ORDER — NITROGLYCERIN 0.4 MG SL SUBL: 0.4000 mg | SUBLINGUAL_TABLET | SUBLINGUAL | Status: DC | PRN

## 2014-05-28 MED ORDER — HEPARIN BOLUS VIA INFUSION
4000.0000 [IU] | Freq: Once | INTRAVENOUS | Status: AC
  Administered 2014-05-28: 4000 [IU] via INTRAVENOUS
  Filled 2014-05-28: qty 4000

## 2014-05-28 MED ORDER — SODIUM CHLORIDE 0.9 % IV SOLN
Freq: Once | INTRAVENOUS | Status: AC
  Administered 2014-05-28: 21:00:00 via INTRAVENOUS

## 2014-05-28 MED ORDER — ASPIRIN 325 MG PO TABS: 325.0000 mg | ORAL_TABLET | ORAL | Status: DC

## 2014-05-28 MED ORDER — HEPARIN (PORCINE) IN NACL 100-0.45 UNIT/ML-% IJ SOLN
1000.0000 [IU]/h | INTRAMUSCULAR | Status: DC
  Administered 2014-05-28: 1000 [IU]/h via INTRAVENOUS
  Filled 2014-05-28 (×2): qty 250

## 2014-05-28 MED ORDER — HYDROMORPHONE HCL PF 1 MG/ML IJ SOLN
0.5000 mg | Freq: Once | INTRAMUSCULAR | Status: AC
  Administered 2014-05-28: 0.5 mg via INTRAVENOUS
  Filled 2014-05-28: qty 1

## 2014-05-28 MED ORDER — LORAZEPAM 1 MG PO TABS
1.0000 mg | ORAL_TABLET | Freq: Once | ORAL | Status: AC
  Administered 2014-05-28: 1 mg via ORAL
  Filled 2014-05-28: qty 1

## 2014-05-28 MED ORDER — NITROGLYCERIN IN D5W 200-5 MCG/ML-% IV SOLN
10.0000 ug/min | INTRAVENOUS | Status: DC
  Administered 2014-05-28: 10 ug/min via INTRAVENOUS
  Filled 2014-05-28: qty 250

## 2014-05-28 NOTE — Telephone Encounter (Signed)
Patient complain of worsening dyspnea, severe dizziness, diaphoresis and chest pressure after recent discharge on 8/21. She states she has been compliant with her medication at home. Over the phone, patient was breathing heavily while talking. I have advised the patient to call 911 and bring her to local ED for evaluation.   Ramond Dial PA Pager: [phone]

## 2014-05-28 NOTE — ED Notes (Signed)
Phlebotomy at the bedside  

## 2014-05-28 NOTE — ED Notes (Signed)
Cardiology at bedside.

## 2014-05-28 NOTE — ED Provider Notes (Signed)
58 year old female had placement of a coronary stent on August 20 and was doing well until about 2:30 AM when she woke up with sweating and some dyspnea and vague chest pressure. His symptoms have persisted through the day. There is no nausea or vomiting. She has not taken anything to treat her symptoms. On exam, she is resting comfortably and in no acute distress. Lungs are clear and heart has regular rate and rhythm. ECG shows no acute changes and a troponin is negative. However, given her recent coronary intervention, she needs to be treated as possible unstable angina and will be admitted. She started on heparin and nitroglycerin.  Medical screening examination/treatment/procedure(s) were conducted as a shared visit with non-physician practitioner(s) and myself.  I personally evaluated the patient during the encounter.   EKG Interpretation   Date/Time:  Monday May 28 2014 18:44:04 EDT Ventricular Rate:  62 PR Interval:  114 QRS Duration: 103 QT Interval:  446 QTC Calculation: 453 R Axis:   35 Text Interpretation:  Sinus rhythm Borderline short PR interval RSR' in V1  or V2, probably normal variant Borderline repolarization abnormality When  compared with ECG of 05/25/2014, No significant change was found Confirmed  by [facility]  MD, Tami Barren ([id number]) on 05/28/2014 6:54:30 PM        Dione Booze, MD 05/28/14 2138

## 2014-05-28 NOTE — ED Notes (Signed)
The patient complained of chest "pressure" and intermittent sharp pain in her chest.  The patient said she did not want anymore nitro because it makes her have a severe headache.  She did say she already had four baby aspirins.  I did an EKG on her and gave it to the MD. No orders given.

## 2014-05-28 NOTE — Progress Notes (Signed)
ANTICOAGULATION CONSULT NOTE - Initial Consult  Pharmacy Consult for heparin Indication: chest pain/ACS  Allergies  Allergen Reactions  . Adhesive [Tape] Itching and Other (See Comments)    Blisters (pls use paper tape)  . Crestor [Rosuvastatin] Other (See Comments)    Muscle pain  . Lipitor [Atorvastatin] Other (See Comments)    Cramps  . Morphine And Related Itching and Nausea And Vomiting    Patient Measurements: Height:  (162.6 cm) Weight: 238 lb (107.956 kg) IBW/kg (Calculated) : 54.7 Heparin Dosing Weight: 80kg  Vital Signs: Temp: 97.7 F (36.5 C) (08/24 1840) Temp src: Oral (08/24 1840) BP: 104/49 mmHg (08/24 2045) Pulse Rate: 53 (08/24 2045)  Labs:  Recent Labs  05/28/14 1851  HGB 11.4*  HCT 33.4*  PLT 250  CREATININE 1.19*  TROPONINI <0.30    Estimated Creatinine Clearance: 61.8 ml/min (by C-G formula based on Cr of 1.19).   Medical History: Past Medical History  Diagnosis Date  . Coronary artery disease   . Hypertension   . Hyperlipidemia   . GERD (gastroesophageal reflux disease)   . Diverticulitis     4 documented episodes by CT; most recent April 2012  . CHF (congestive heart failure)   . Depression   . Anxiety   . Shortness of breath   . Hypothyroidism   . MI (myocardial infarction) 2005; 2009; 08/2010 X 2  . Anginal pain   . COPD (chronic obstructive pulmonary disease)     "cleared up significantly since I quit smoking"   . Asthma     "cleared up significantly since I quit smoking"    . Chronic bronchitis     "usually q yr"    . Sleep apnea     has been diagnosed but didn't follow up to get CPAP  . Anemia     "related to chole OR"  . History of blood transfusion     "several times; w/chole; pneumonia, diverticulitis"  . Tension headache     "monthly"  . Seizures     "stress related"  . Arthritis     "q joint in my body"  . Chronic back pain     "neck to tailbone"  . DDD (degenerative disc disease)     "all over"  .  Pneumonia 04/2011  . Kidney stones   . Acute renal failure ~ 04/2011    "when I had pneumonia"    Assessment: 84 YOF who had a stent placed on 05/24/2014 brought in with chest pain/pressure. Troponin is negative, EKG with no acute changes. Treating as unstable angina. She was on aspirin and Effient at home, but no anticoagulation. Baseline hgb 11.4, plts 250. No bleeding noted.  Goal of Therapy:  Heparin level 0.3-0.7 units/ml Monitor platelets by anticoagulation protocol: Yes   Plan:  1. Stat aPTT and INR for baseline values 2. Heparin 4000 units IV x1 as bolus 3. Start heparin drip at 1000 units/hr 4. Heparin level in 6 hours 5. Daily heparin level and CBC 6. Follow cardiology plans  Khushboo Chuck D. Robi Mitter, PharmD, BCPS Clinical Pharmacist Pager: [phone] 05/28/2014 9:48 PM

## 2014-05-28 NOTE — ED Notes (Signed)
Pt in from home via [facility], per report pt c/o non radiaiting mid CP, pt c/o SOB, denies n/v/d, onset today @ noon, pt rcvd 324 mg ASA, per EMS arrival pt hypotensive @ 88 systolic BP, pt rcvd 400 mL NS PTA, pt BP 130 sys upon arrival to ED, pt recent hx stent placement 05/24/14, pt A&O x4, follows commands, speaks in complete sentences

## 2014-05-28 NOTE — H&P (Addendum)
Patient ID: [name] MRN: [id number], DOB/AGE: 11/11/1955   Admit date: 05/28/2014   Primary Physician: Alonza Bogus, MD Primary Cardiologist: Carlyle Dolly, MD  Pt. Profile:  58F CAD s/p CABG (LIMA to diag, occluded SVG to LAD, occluded SVG-OM, PCI to SVG to RCA on 05/24/14) , anxiety, and former tobacco abuse who presents with atypical chest pain.   Problem List  Past Medical History  Diagnosis Date  . Coronary artery disease   . Hypertension   . Hyperlipidemia   . GERD (gastroesophageal reflux disease)   . Diverticulitis     4 documented episodes by CT; most recent April 2012  . CHF (congestive heart failure)   . Depression   . Anxiety   . Shortness of breath   . Hypothyroidism   . MI (myocardial infarction) 2005; 2009; 08/2010 X 2  . Anginal pain   . COPD (chronic obstructive pulmonary disease)     "cleared up significantly since I quit smoking"   . Asthma     "cleared up significantly since I quit smoking"    . Chronic bronchitis     "usually q yr"    . Sleep apnea     has been diagnosed but didn't follow up to get CPAP  . Anemia     "related to chole OR"  . History of blood transfusion     "several times; w/chole; pneumonia, diverticulitis"  . Tension headache     "monthly"  . Seizures     "stress related"  . Arthritis     "q joint in my body"  . Chronic back pain     "neck to tailbone"  . DDD (degenerative disc disease)     "all over"  . Pneumonia 04/2011  . Kidney stones   . Acute renal failure ~ 04/2011    "when I had pneumonia"    Past Surgical History  Procedure Laterality Date  . Coronary artery bypass graft  2005    LIMA to AL, SVG to LAD, SVG to Cx, SVG-PDA  . Carotid endarterectomy  2005  . Knee arthroscopy w/ acl reconstruction Left 2001  . Carpal tunnel with cubital tunnel Right 2001  . Coronary angioplasty with stent placement  2011    Promus Premier DES, 3.5 mm x 12 mm, for 99% proximal stent ISR in the SVG-RPDA   .  Cardiac catheterization  Jan 2012    patent stents in RCA graft; CAD stable  . Coronary angioplasty with stent placement  05/24/2014  . Appendectomy  ~ 1970  . Laparoscopic cholecystectomy  2010  . Vaginal hysterectomy  ~ 1993  . Tubal ligation  ~ 1989     Allergies  Allergies  Allergen Reactions  . Adhesive [Tape] Itching and Other (See Comments)    Blisters (pls use paper tape)  . Crestor [Rosuvastatin] Other (See Comments)    Muscle pain  . Lipitor [Atorvastatin] Other (See Comments)    Cramps  . Morphine And Related Itching and Nausea And Vomiting    HPI  58F CAD s/p CABG (LIMA to diag, occluded SVG to LAD, occluded SVG-OM, PCI to SVG to RCA on 05/24/14) , anxiety, and former tobacco abuse who presents with atypical chest pain.   [name] reports that at 2:30am this morning she awoke in a sweat and just "didn't feel right." She remained awake until 8am at which point she dozed off until 10am. She described worsened symptoms that she describd as a heaviness in my  arms...all over" with a feeling of fatigue and lightheadedness. She also described a sharper pain that lasted very briefly but recurred multiple times. She has not missed any dose of aspirin or prasugrel.   She reports that the above syndrome is different from the pain prior to her recent PCI (05/24/14). She notes that she had it once years ago in the setting of personal stressors (husband invited a stranger he met while driving a truck to live at home with them) but also thinks she had an MI around the time of those symptoms. She reports that she is experiencing a significant amount of stress at this time related her her husband who is currently incarcerated and she is divorcing. She reports that last night she was on the phone with her husbands cousin and the discussion was very stressful. Although she takes Xanax PRN she did not take any last night or this morning.   She reported to the ED where she was hemodynamically  stable. ECG was unchanged from prior. She was given 353m ASA and started on IV heparin and IV nitro. Cardiology was consulted for admission. On my interview she became very anxious and nearly tearful while talking about the issues with her husband.   Home Medications  Prior to Admission medications   Medication Sig Start Date End Date Taking? Authorizing Provider  albuterol (PROAIR HFA) 108 (90 BASE) MCG/ACT inhaler Inhale 2 puffs into the lungs every 6 (six) hours as needed for wheezing or shortness of breath.   Yes Historical Provider, MD  albuterol (PROVENTIL) (2.5 MG/3ML) 0.083% nebulizer solution Take 2.5 mg by nebulization every 6 (six) hours as needed for wheezing or shortness of breath.   Yes Historical Provider, MD  ALPRAZolam (Duanne Moron 1 MG tablet Take 1 mg by mouth 4 (four) times daily as needed for anxiety or sleep.    Yes Historical Provider, MD  amLODipine (NORVASC) 10 MG tablet Take 10 mg by mouth daily.   Yes Historical Provider, MD  aspirin EC 81 MG tablet Take 81 mg by mouth daily.   Yes Historical Provider, MD  benazepril (LOTENSIN) 20 MG tablet Take 20 mg by mouth daily.   Yes Historical Provider, MD  furosemide (LASIX) 80 MG tablet Take 80 mg by mouth daily as needed for fluid or edema (swelling).   Yes Historical Provider, MD  HYDROcodone-acetaminophen (NORCO) 10-325 MG per tablet Take 1 tablet by mouth every 4 (four) hours as needed for moderate pain (pain).    Yes Historical Provider, MD  levothyroxine (SYNTHROID, LEVOTHROID) 75 MCG tablet Take 75 mcg by mouth daily before breakfast.    Yes Historical Provider, MD  lovastatin (MEVACOR) 40 MG tablet Take 40 mg by mouth at bedtime.    Yes Historical Provider, MD  metoprolol succinate (TOPROL-XL) 50 MG 24 hr tablet Take 50 mg by mouth daily. Take with or immediately following a meal.   Yes Historical Provider, MD  Multiple Vitamin (MULTIVITAMIN WITH MINERALS) TABS tablet Take 1 tablet by mouth 2 (two) times daily. 08/28/13  Yes  JArnoldo Lenis MD  nitroGLYCERIN (NITROSTAT) 0.4 MG SL tablet Place 0.4 mg under the tongue every 5 (five) minutes as needed for chest pain. If no relief in 15 minutes call 911   Yes Historical Provider, MD  omeprazole (PRILOSEC) 20 MG capsule Take 1 capsule (20 mg total) by mouth daily. 11/16/13  Yes KLendon Colonel NP  potassium chloride SA (K-DUR,KLOR-CON) 20 MEQ tablet Take 20 mEq by mouth daily as needed (  take with lasix (furosemide)). Take when potassium is low   Yes Historical Provider, MD  prasugrel (EFFIENT) 10 MG TABS tablet Take 1 tablet (10 mg total) by mouth daily. 05/25/14  Yes Rhonda G Barrett, PA-C    Family History  Family History  Problem Relation Age of Onset  . Heart attack Mother   . Heart disease Mother   . Breast cancer Mother   . Heart attack Father   . Heart disease Father   . Colon cancer Neg Hx     Social History  History   Social History  . Marital Status: Legally Separated    Spouse Name: N/A    Number of Children: 3  . Years of Education: N/A   Occupational History  . disabled    Social History Main Topics  . Smoking status: Former Smoker -- 0.50 packs/day for 21 years    Types: Cigarettes    Quit date: 01/03/2014  . Smokeless tobacco: Never Used  . Alcohol Use: No  . Drug Use: Yes    Special: Cocaine, Marijuana     Comment: 05/24/2014 "last used in 02/2004"  . Sexual Activity: No   Other Topics Concern  . Not on file   Social History Narrative  . No narrative on file     Review of Systems General:  No chills, fever, night sweats or weight changes.  Cardiovascular:  See HPI Dermatological: No rash, lesions/masses Respiratory: No cough, dyspnea Urologic: No hematuria, dysuria Abdominal:   No nausea, vomiting, diarrhea, bright red blood per rectum, melena, or hematemesis Neurologic:  No visual changes, wkns, changes in mental status. All other systems reviewed and are otherwise negative except as noted above.  Physical  Exam  Blood pressure 103/54, pulse 58, temperature 97.7 F (36.5 C), temperature source Oral, resp. rate 14, height _0  (1.626 m), weight 107.956 kg (238 lb), SpO2 100.00%.  General: Anxious, NAD, obese Psych: Anxious Neuro: Alert and oriented X 3. Moves all extremities spontaneously. HEENT: Normal  Neck: Supple without bruits or JVD. Lungs:  Resp regular and unlabored, CTA. Heart: RRR no s3, s4, or murmurs. Abdomen: Soft, non-tender, non-distended, BS + x 4.  Extremities: No clubbing, cyanosis or edema. DP/PT/Radials 2+ and equal bilaterally. LRA cath site with some bruising but no hematoma.   Labs  Troponin [facility] Test)  Recent Labs  05/28/14 1906  TROPIPOC 0.00    Recent Labs  05/28/14 1851 05/28/14 2150  TROPONINI <0.30 <0.30   Lab Results  Component Value Date   WBC 7.2 05/28/2014   HGB 11.4* 05/28/2014   HCT 33.4* 05/28/2014   MCV 89.3 05/28/2014   PLT 250 05/28/2014    Recent Labs Lab 05/28/14 1851  NA 139  K 4.6  CL 100  CO2 25  BUN 17  CREATININE 1.19*  CALCIUM 8.9  GLUCOSE 97   Lab Results  Component Value Date   CHOL 174 04/02/2014   HDL 36* 04/02/2014   LDLCALC 79 04/02/2014   TRIG 293* 04/02/2014   No results found for this basename: DDIMER     Radiology/Studies  Dg Chest Port 1 View  05/28/2014   CLINICAL DATA:  Chest pain  EXAM: PORTABLE CHEST - 1 VIEW  COMPARISON:  10/01/2013  FINDINGS: There is no focal parenchymal opacity, pleural effusion, or pneumothorax. There is stable cardiomegaly. There is evidence of prior CABG.  The osseous structures are unremarkable.  IMPRESSION: No active disease.   Electronically Signed   By: Elbert Ewings  Patel   On: 05/28/2014 20:00   03/2014 Echo  Study Conclusions  - Left ventricle: The cavity size was normal. Wall thickness was increased in a pattern of mild LVH. Systolic function was normal. The estimated ejection fraction was in the range of 50% to 55%. There is akinesis of the basalinferolateral  myocardium. Features are consistent with a pseudonormal left ventricular filling pattern, with concomitant abnormal relaxation and increased filling pressure (grade 2 diastolic dysfunction). - Aortic valve: Mildly calcified annulus. Trileaflet. There was no significant regurgitation. - Mitral valve: There was trivial regurgitation. - Left atrium: The atrium was mildly to moderately dilated. - Right ventricle: Systolic function was mildly reduced. - Right atrium: Central venous pressure (est): 3 mm Hg. - Atrial septum: No defect or patent foramen ovale was identified. - Tricuspid valve: There was trivial regurgitation. - Pulmonary arteries: Systolic pressure could not be accurately estimated. - Pericardium, extracardiac: There was no pericardial effusion.  Impressions:  - Mild LVH with LVEF 50-55%, basal inferolateral hypokinesis, grade 2 diastolic dysfunction. Mild to moderate left atrial enlargement. Mildly reduced RV contraction. Unable to assess PASP.  03/2014 Carotid US  IMPRESSION:  Bilateral 50-69% stenosis. The left internal carotid peak systolic  velocity has increased slightly in the interval from the prior exam.   05/24/14 Cath and PCI Left Ventriculography:  EF: 50 at 55 %  Wall Motion: Severe hypokinesis of the Basal to MID inferior-inferolateraL wall with mild hypokinesis of the basal anterior wall Coronary Anatomy:  Dominance: Right Left Main: Normal caliber vessel that itself is free of significant disease. Bifurcates into the LAD and Circumflex LAD: Moderate caliber vessel that is 100% occluded after a septal perforator and small diagonal branch. There is a first diagonal branch that is also half percent occluded after initial subtotal occlusion.  Left Circumflex: Moderate caliber vessel it gives rise to a proximal small caliber OM branch that is somewhat tortuous and free of significant disease. The vessel then bifurcates into the introducer complex which is small  in caliber and perfuses the basal inferolateral wall. There is a major lateral OM branch the visualized the proximal segment is occluded. There is late retrograde filling of the remainder the nature OM which appears to have 2 branches distally. This is faint collaterals from the proximal OM.  RCA: 100% proximal occluded.  SVG-RPDA: Large-caliber graft with mildly irregular is proximally, there are overlapping stents in the distal mid graft with 99% napkin ring proximal in-stent restenosis of the most proximal stent that extends just minimally to the unstented segment. The remainder of the graft is relatively free of disease and attached to the mid RPDA.  RPDA: Large-caliber tortuous vessel that reaches almost to the apex.  RPL Sysytem:The RPAV fills via retrograde flow in the RPDA. There are several small posterolateral branches that are relatively free of disease. The PAV is relatively free of disease.  Severe native CAD with 100% negative RCA, LAD & D1 along with OM 1 and OM 2 as previously described  Severe proximal edge in-stent restenosis of SVG-RPDA  Successful PCI of the proximal edge in-stent restenosis of SVG RCA with scoring balloon angioplasty followed by DES stent placement: Promus Premier 3.5 mm x 12 mm postdilated to 4.1 mm  Widely patent LIMA-diagonal with distal collaterals to what appears to be the apical LAD.  Known occlusion of the SVG-LAD and SVG-OM  Relatively preserved LVEF as noted on the cardiogram recently. There is known hypokinesis to akinesis of the nasal to mid inferior inferolateral  wall as well as mild hypokinesis of the basal anterior wall assistant with known CAD.  ECG  NSR, short PR, cristae pattern. NSSTTWC. Unchanged from prior.  ASSESSMENT AND PLAN  42F CAD s/p CABG (LIMA to diag, occluded SVG to LAD, occluded SVG-OM, PCI to SVG to RCA on 05/24/14) , anxiety, and former tobacco abuse who presents with atypical chest pain. I suspect that this is more likely  related to anxiety than an issue with the stent issue (or new epicardial or graft lesion).   Plan 1. Discontinue nitro drip 2. Cycle troponins 3. OK to continue heparin for time being; will likely stop in AM based on enzymes 4. Continue home meds including PRN Xanax 5. NPO for now. 6. SW consult ordered given stressors and coping issues  Signed, Lamar Sprinkles, MD 05/28/2014, 10:54 PM

## 2014-05-28 NOTE — ED Provider Notes (Signed)
CSN: [id number]     Arrival date & time 05/28/14  1830 History   First MD Initiated Contact with Patient 05/28/14 2100     Chief Complaint  Patient presents with  . Chest Pain    (Consider location/radiation/quality/duration/timing/severity/associated sxs/prior Treatment) HPI Comments: Patient is a 58 y/o female with a hx of CAD (s/p 4 vessel CABG in 2005 and ACS in 2005, 2009, 2011 x 2) with recent stent placement 4 days ago, anginal chest pain, CHF (LVEF 50-55% in 03/2014), HTN, and HLD. She presents to the emergency department today for chest pain that she describes as a pressure with onset at 1200 today. Patient states the pain is intermittent and begins as a pressure-like pain which escalate to a sharp pain. When the pain is sharp it radiates from her left chest to her midsternal region and left shoulder. Chest pain will then resolve for approximately 5 minutes before recurring. Patient states that symptoms worsened at 1700 today when she experienced associated shortness of breath, diaphoresis/clamminess, and a heaviness in her bilateral arms. Patient states that she took 4 baby aspirin prior to arrival with some improvement, but no resolution, in her symptoms. Patient did not take any nitroglycerin because she states that this gives her a headache. Patient denies fever, syncope, near syncope, leg swelling, palpitations, vomiting, and abdominal pain.  PCP - Dr. [name] Cardiologist - Dr. Wyline Mood Stent placed by Dr. Herbie Baltimore on 05/24/14 for proximal instent restenosis of SVG-RCA. Cardiac catheterization also showed native 100% occlusion of LAD, D1 and OM1 and OM2, as well as RCA.   Patient is a 58 y.o. female presenting with chest pain. The history is provided by the patient. No language interpreter was used.  Chest Pain Associated symptoms: diaphoresis, nausea, shortness of breath and weakness   Associated symptoms: no abdominal pain, no fever, no numbness, no palpitations and not vomiting      Past Medical History  Diagnosis Date  . Coronary artery disease   . Hypertension   . Hyperlipidemia   . GERD (gastroesophageal reflux disease)   . Diverticulitis     4 documented episodes by CT; most recent April 2012  . CHF (congestive heart failure)   . Depression   . Anxiety   . Shortness of breath   . Hypothyroidism   . MI (myocardial infarction) 2005; 2009; 08/2010 X 2  . Anginal pain   . COPD (chronic obstructive pulmonary disease)     "cleared up significantly since I quit smoking"   . Asthma     "cleared up significantly since I quit smoking"    . Chronic bronchitis     "usually q yr"    . Sleep apnea     has been diagnosed but didn't follow up to get CPAP  . Anemia     "related to chole OR"  . History of blood transfusion     "several times; w/chole; pneumonia, diverticulitis"  . Tension headache     "monthly"  . Seizures     "stress related"  . Arthritis     "q joint in my body"  . Chronic back pain     "neck to tailbone"  . DDD (degenerative disc disease)     "all over"  . Pneumonia 04/2011  . Kidney stones   . Acute renal failure ~ 04/2011    "when I had pneumonia"   Past Surgical History  Procedure Laterality Date  . Coronary artery bypass graft  2005    LIMA to  AL, SVG to LAD, SVG to Cx, SVG-PDA  . Carotid endarterectomy  2005  . Knee arthroscopy w/ acl reconstruction Left 2001  . Carpal tunnel with cubital tunnel Right 2001  . Coronary angioplasty with stent placement  2011    Promus Premier DES, 3.5 mm x 12 mm, for 99% proximal stent ISR in the SVG-RPDA   . Cardiac catheterization  Jan 2012    patent stents in RCA graft; CAD stable  . Coronary angioplasty with stent placement  05/24/2014  . Appendectomy  ~ 1970  . Laparoscopic cholecystectomy  2010  . Vaginal hysterectomy  ~ 1993  . Tubal ligation  ~ 1989   Family History  Problem Relation Age of Onset  . Heart attack Mother   . Heart disease Mother   . Breast cancer Mother   . Heart  attack Father   . Heart disease Father   . Colon cancer Neg Hx    History  Substance Use Topics  . Smoking status: Former Smoker -- 0.50 packs/day for 21 years    Types: Cigarettes    Quit date: 01/03/2014  . Smokeless tobacco: Never Used  . Alcohol Use: No   OB History   Grav Para Term Preterm Abortions TAB SAB Ect Mult Living                  Review of Systems  Constitutional: Positive for diaphoresis. Negative for fever.  Respiratory: Positive for shortness of breath.   Cardiovascular: Positive for chest pain. Negative for palpitations and leg swelling.  Gastrointestinal: Positive for nausea. Negative for vomiting, abdominal pain and diarrhea.  Neurological: Positive for weakness. Negative for syncope and numbness.  All other systems reviewed and are negative.    Allergies  Adhesive; Crestor; Lipitor; and Morphine and related  Home Medications   Prior to Admission medications   Medication Sig Start Date End Date Taking? Authorizing Provider  albuterol (PROAIR HFA) 108 (90 BASE) MCG/ACT inhaler Inhale 2 puffs into the lungs every 6 (six) hours as needed for wheezing or shortness of breath.    Historical Provider, MD  albuterol (PROVENTIL) (2.5 MG/3ML) 0.083% nebulizer solution Take 2.5 mg by nebulization every 6 (six) hours as needed for wheezing or shortness of breath.    Historical Provider, MD  ALPRAZolam Prudy Feeler) 1 MG tablet Take 1 mg by mouth 4 (four) times daily as needed for anxiety or sleep.     Historical Provider, MD  amLODipine (NORVASC) 10 MG tablet Take 10 mg by mouth daily.    Historical Provider, MD  aspirin EC 81 MG tablet Take 81 mg by mouth daily.    Historical Provider, MD  benazepril (LOTENSIN) 20 MG tablet Take 20 mg by mouth daily.    Historical Provider, MD  furosemide (LASIX) 80 MG tablet Take 80 mg by mouth daily as needed for fluid or edema (swelling).    Historical Provider, MD  HYDROcodone-acetaminophen (NORCO) 10-325 MG per tablet Take 6 tablets  by mouth every 6 (six) hours as needed for moderate pain (pain). Take 6 tablets whenever she needs it per patient    Historical Provider, MD  levothyroxine (SYNTHROID, LEVOTHROID) 75 MCG tablet Take 75 mcg by mouth daily before breakfast.     Historical Provider, MD  lovastatin (MEVACOR) 40 MG tablet Take 40 mg by mouth daily.    Historical Provider, MD  metoprolol succinate (TOPROL-XL) 50 MG 24 hr tablet Take 50 mg by mouth daily. Take with or immediately following a meal.  Historical Provider, MD  Multiple Vitamin (MULTIVITAMIN WITH MINERALS) TABS tablet Take 1 tablet by mouth 2 (two) times daily. 08/28/13   Antoine Poche, MD  nitroGLYCERIN (NITROSTAT) 0.4 MG SL tablet Place 0.4 mg under the tongue every 5 (five) minutes as needed for chest pain. If no relief in 15 minutes call 911    Historical Provider, MD  omeprazole (PRILOSEC) 20 MG capsule Take 1 capsule (20 mg total) by mouth daily. 11/16/13   Jodelle Gross, NP  potassium chloride SA (K-DUR,KLOR-CON) 20 MEQ tablet Take 20 mEq by mouth daily as needed (take with lasix (furosemide)). Take when potassium is low    Historical Provider, MD  prasugrel (EFFIENT) 10 MG TABS tablet Take 1 tablet (10 mg total) by mouth daily. 05/25/14   Rhonda G Barrett, PA-C   BP 104/49  Pulse 53  Temp(Src) 97.7 F (36.5 C) (Oral)  Resp 11  Ht  (1.626 m)  Wt 238 lb (107.956 kg)  BMI 40.83 kg/m2  SpO2 100%  Physical Exam  Nursing note and vitals reviewed. Constitutional: She is oriented to person, place, and time. She appears well-developed and well-nourished. No distress.  Nontoxic/nonseptic appearing.  HENT:  Head: Normocephalic and atraumatic.  Eyes: Conjunctivae and EOM are normal. No scleral icterus.  Neck: Normal range of motion.  No JVD  Cardiovascular: Normal rate, regular rhythm and intact distal pulses.   Heart RRR  Pulmonary/Chest: Effort normal. No respiratory distress. She has no wheezes. She has no rales.  No tachypnea or  dyspnea  Abdominal: Soft. There is no tenderness. There is no rebound and no guarding.  Soft morbidly obese abdomen. No TTP. No peritoneal signs.  Musculoskeletal: Normal range of motion.  Neurological: She is alert and oriented to person, place, and time. She exhibits normal muscle tone. Coordination normal.  GCS 15. Patient moves extremities without ataxia.  Skin: Skin is warm and dry. No rash noted. She is not diaphoretic. No erythema. No pallor.  Psychiatric: She has a normal mood and affect. Her behavior is normal.    ED Course  Procedures (including critical care time)  Labs Review Labs Reviewed  CBC - Abnormal; Notable for the following:    RBC 3.74 (*)    Hemoglobin 11.4 (*)    HCT 33.4 (*)    All other components within normal limits  BASIC METABOLIC PANEL - Abnormal; Notable for the following:    Creatinine, Ser 1.19 (*)    GFR calc non Af Amer 49 (*)    GFR calc Af Amer 57 (*)    All other components within normal limits  PRO B NATRIURETIC PEPTIDE - Abnormal; Notable for the following:    Pro B Natriuretic peptide (BNP) 191.9 (*)    All other components within normal limits  TROPONIN I  TROPONIN I  Rosezena Sensor, ED    Imaging Review Dg Chest Port 1 View  05/28/2014   CLINICAL DATA:  Chest pain  EXAM: PORTABLE CHEST - 1 VIEW  COMPARISON:  10/01/2013  FINDINGS: There is no focal parenchymal opacity, pleural effusion, or pneumothorax. There is stable cardiomegaly. There is evidence of prior CABG.  The osseous structures are unremarkable.  IMPRESSION: No active disease.   Electronically Signed   By: Elige Ko   On: 05/28/2014 20:00     EKG Interpretation   Date/Time:  Monday May 28 2014 18:44:04 EDT Ventricular Rate:  62 PR Interval:  114 QRS Duration: 103 QT Interval:  446 QTC Calculation: 453  R Axis:   35 Text Interpretation:  Sinus rhythm Borderline short PR interval RSR' in V1  or V2, probably normal variant Borderline repolarization abnormality  When  compared with ECG of 05/25/2014, No significant change was found Confirmed  by [facility]  MD, DAVID ([id number]) on 05/28/2014 6:54:30 PM      CRITICAL CARE Performed by: Antony Madura   Total critical care time: 35  Critical care time was exclusive of separately billable procedures and treating other patients.  Critical care was necessary to treat or prevent imminent or life-threatening deterioration.  Critical care was time spent personally by me on the following activities: development of treatment plan with patient and/or surrogate as well as nursing, discussions with consultants, evaluation of patient's response to treatment, examination of patient, obtaining history from patient or surrogate, ordering and performing treatments and interventions, ordering and review of laboratory studies, ordering and review of radiographic studies, pulse oximetry and re-evaluation of patient's condition.  MDM   Final diagnoses:  Unstable angina    58 year old female presents the emergency department for chest pain described as a pressure with associated shortness of breath, diaphoresis, and heaviness in her bilateral arms. Symptoms began at noon today and worsened at 1700. Patient has a history of stent placement 4 days ago by Dr. Herbie Baltimore. Cardiologist is Dr. Wyline Mood of [place]. Patient still with chest pain at rest. Initial cardiac workup today is unremarkable and EKG shows no ischemic change. Symptoms concerning for unstable angina. Will admit to cardiology service. Dr. Zachery Conch notified of patient. He will see and admit. Heparin and nitroglycerin drip initiated in ED.   Filed Vitals:   05/28/14 2045 05/28/14 2115 05/28/14 2130 05/28/14 2145  BP: 104/49 118/58 115/47 103/54  Pulse: 53 56 59 58  Temp:      TempSrc:      Resp: Height:      Weight:      SpO2: 100% 100% 100% 100%      Antony Madura, PA-C 05/28/14 2158

## 2014-05-29 ENCOUNTER — Encounter (HOSPITAL_COMMUNITY): Payer: Self-pay | Admitting: Physician Assistant

## 2014-05-29 DIAGNOSIS — I209 Angina pectoris, unspecified: Secondary | ICD-10-CM

## 2014-05-29 DIAGNOSIS — I200 Unstable angina: Secondary | ICD-10-CM | POA: Diagnosis not present

## 2014-05-29 DIAGNOSIS — R110 Nausea: Secondary | ICD-10-CM | POA: Diagnosis not present

## 2014-05-29 DIAGNOSIS — R079 Chest pain, unspecified: Secondary | ICD-10-CM | POA: Diagnosis not present

## 2014-05-29 DIAGNOSIS — R61 Generalized hyperhidrosis: Secondary | ICD-10-CM | POA: Diagnosis not present

## 2014-05-29 LAB — BASIC METABOLIC PANEL
ANION GAP: 12 (ref 5–15)
BUN: 14 mg/dL (ref 6–23)
CALCIUM: 8.5 mg/dL (ref 8.4–10.5)
CO2: 25 mEq/L (ref 19–32)
Chloride: 101 mEq/L (ref 96–112)
Creatinine, Ser: 0.89 mg/dL (ref 0.50–1.10)
GFR calc Af Amer: 81 mL/min — ABNORMAL LOW (ref >90)
GFR, EST NON AFRICAN AMERICAN: 70 mL/min — AB (ref >90)
GLUCOSE: 99 mg/dL (ref 70–99)
Potassium: 4.2 mEq/L (ref 3.7–5.3)
SODIUM: 138 meq/L (ref 137–147)

## 2014-05-29 LAB — PROTIME-INR
INR: 1.17 (ref 0.00–1.49)
PROTHROMBIN TIME: 14.9 s (ref 11.6–15.2)

## 2014-05-29 LAB — CBC
HEMATOCRIT: 30.4 % — AB (ref 36.0–46.0)
HEMOGLOBIN: 10.3 g/dL — AB (ref 12.0–15.0)
MCH: 30.6 pg (ref 26.0–34.0)
MCHC: 33.9 g/dL (ref 30.0–36.0)
MCV: 90.2 fL (ref 78.0–100.0)
Platelets: 214 10*3/uL (ref 150–400)
RBC: 3.37 MIL/uL — ABNORMAL LOW (ref 3.87–5.11)
RDW: 12.8 % (ref 11.5–15.5)
WBC: 5.4 10*3/uL (ref 4.0–10.5)

## 2014-05-29 LAB — TROPONIN I: Troponin I: <0.3 ng/mL (ref <0.30)

## 2014-05-29 LAB — HEPARIN LEVEL (UNFRACTIONATED): Heparin Unfractionated: <0.1 IU/mL — ABNORMAL LOW (ref 0.30–0.70)

## 2014-05-29 LAB — LIPID PANEL
CHOL/HDL RATIO: 5.8 ratio
Cholesterol: 196 mg/dL (ref 0–200)
HDL: 34 mg/dL — ABNORMAL LOW (ref >39)
LDL Cholesterol: 93 mg/dL (ref 0–99)
Triglycerides: 343 mg/dL — ABNORMAL HIGH (ref <150)
VLDL: 69 mg/dL — AB (ref 0–40)

## 2014-05-29 MED ORDER — LEVOTHYROXINE SODIUM 75 MCG PO TABS
75.0000 ug | ORAL_TABLET | Freq: Every day | ORAL | Status: DC
  Administered 2014-05-29: 75 ug via ORAL
  Filled 2014-05-29 (×2): qty 1

## 2014-05-29 MED ORDER — ASPIRIN EC 81 MG PO TBEC: 81.0000 mg | DELAYED_RELEASE_TABLET | Freq: Every day | ORAL | Status: DC

## 2014-05-29 MED ORDER — ACETAMINOPHEN 325 MG PO TABS: 650.0000 mg | ORAL_TABLET | ORAL | Status: DC | PRN

## 2014-05-29 MED ORDER — PANTOPRAZOLE SODIUM 40 MG PO TBEC
80.0000 mg | DELAYED_RELEASE_TABLET | Freq: Every day | ORAL | Status: DC
  Administered 2014-05-29: 80 mg via ORAL
  Filled 2014-05-29: qty 2

## 2014-05-29 MED ORDER — ALBUTEROL SULFATE HFA 108 (90 BASE) MCG/ACT IN AERS: 2.0000 | INHALATION_SPRAY | Freq: Four times a day (QID) | RESPIRATORY_TRACT | Status: DC | PRN

## 2014-05-29 MED ORDER — POTASSIUM CHLORIDE CRYS ER 20 MEQ PO TBCR: 20.0000 meq | EXTENDED_RELEASE_TABLET | Freq: Every day | ORAL | Status: DC | PRN

## 2014-05-29 MED ORDER — ONDANSETRON HCL 4 MG/2ML IJ SOLN: 4.0000 mg | Freq: Four times a day (QID) | INTRAMUSCULAR | Status: DC | PRN

## 2014-05-29 MED ORDER — NITROGLYCERIN 0.4 MG SL SUBL: 0.4000 mg | SUBLINGUAL_TABLET | SUBLINGUAL | Status: DC | PRN

## 2014-05-29 MED ORDER — ZOLPIDEM TARTRATE 5 MG PO TABS: 5.0000 mg | ORAL_TABLET | Freq: Every evening | ORAL | Status: DC | PRN

## 2014-05-29 MED ORDER — FUROSEMIDE 80 MG PO TABS
80.0000 mg | ORAL_TABLET | Freq: Every day | ORAL | Status: DC | PRN
  Filled 2014-05-29: qty 1

## 2014-05-29 MED ORDER — ASPIRIN EC 81 MG PO TBEC
81.0000 mg | DELAYED_RELEASE_TABLET | Freq: Every day | ORAL | Status: DC
  Administered 2014-05-29: 81 mg via ORAL
  Filled 2014-05-29: qty 1

## 2014-05-29 MED ORDER — FUROSEMIDE 80 MG PO TABS: 80.0000 mg | ORAL_TABLET | Freq: Every day | ORAL | Status: DC | PRN

## 2014-05-29 MED ORDER — PRASUGREL HCL 10 MG PO TABS
10.0000 mg | ORAL_TABLET | Freq: Every day | ORAL | Status: DC
  Administered 2014-05-29: 10 mg via ORAL
  Filled 2014-05-29: qty 1

## 2014-05-29 MED ORDER — NON FORMULARY: 40.0000 mg | Freq: Every day | Status: DC

## 2014-05-29 MED ORDER — METOPROLOL SUCCINATE ER 50 MG PO TB24
50.0000 mg | ORAL_TABLET | Freq: Every day | ORAL | Status: DC
  Administered 2014-05-29: 50 mg via ORAL
  Filled 2014-05-29: qty 1

## 2014-05-29 MED ORDER — ALBUTEROL SULFATE (2.5 MG/3ML) 0.083% IN NEBU: 2.5000 mg | INHALATION_SOLUTION | Freq: Four times a day (QID) | RESPIRATORY_TRACT | Status: DC | PRN

## 2014-05-29 MED ORDER — ALPRAZOLAM 0.5 MG PO TABS
1.0000 mg | ORAL_TABLET | Freq: Four times a day (QID) | ORAL | Status: DC | PRN
  Administered 2014-05-29: 1 mg via ORAL
  Filled 2014-05-29: qty 2

## 2014-05-29 MED ORDER — BENAZEPRIL HCL 20 MG PO TABS
20.0000 mg | ORAL_TABLET | Freq: Every day | ORAL | Status: DC
  Administered 2014-05-29: 20 mg via ORAL
  Filled 2014-05-29: qty 1

## 2014-05-29 MED ORDER — LOVASTATIN 20 MG PO TABS
40.0000 mg | ORAL_TABLET | Freq: Every day | ORAL | Status: DC
  Filled 2014-05-29: qty 2

## 2014-05-29 MED ORDER — AMLODIPINE BESYLATE 10 MG PO TABS
10.0000 mg | ORAL_TABLET | Freq: Every day | ORAL | Status: DC
  Administered 2014-05-29: 10 mg via ORAL
  Filled 2014-05-29: qty 1

## 2014-05-29 MED ORDER — ADULT MULTIVITAMIN W/MINERALS CH
1.0000 | ORAL_TABLET | Freq: Two times a day (BID) | ORAL | Status: DC
  Administered 2014-05-29: 1 via ORAL
  Filled 2014-05-29 (×2): qty 1

## 2014-05-29 NOTE — ED Notes (Signed)
Pt denies chest pain at this time.

## 2014-05-29 NOTE — Progress Notes (Signed)
Patient: [name] / Admit Date: 05/28/2014 / Date of Encounter: 05/29/2014, 8:31 AM   Subjective: No further CP. Just feels tired.   Objective: EKG NSR 58bpm TWI I, avL, flattening in V6 - slightly improved from post-PCI EKG on 05/25/14 Telemetry: NSR/transient nocturnal SB Physical Exam: Blood pressure 124/64, pulse 57, temperature 97.8 F (36.6 C), temperature source Oral, resp. rate 18, height  (1.626 m), weight 239 lb 10.2 oz (108.7 kg), SpO2 99.00%. General: Well developed, well nourished obese WF, in no acute distress. Laying flat in bed Head: Normocephalic, atraumatic, sclera non-icteric, no xanthomas, nares are without discharge. Neck: JVP not elevated. Lungs: Clear bilaterally to auscultation without wheezes, rales, or rhonchi. Breathing is unlabored. Heart: RRR S1 S2 without murmurs, rubs, or gallops.  Abdomen: Soft, non-tender, non-distended with normoactive bowel sounds. No rebound/guarding. Extremities: No clubbing or cyanosis. No edema. Distal pedal pulses are 2+ and equal bilaterally. Neuro: Alert and oriented X 3. Moves all extremities spontaneously. Psych:  Responds to questions appropriately with a normal affect.   Intake/Output Summary (Last 24 hours) at 05/29/14 0831 Last data filed at 05/29/14 0453  Gross per 24 hour  Intake      0 ml  Output    800 ml  Net   -800 ml    Inpatient Medications:  . amLODipine  10 mg Oral Daily  . aspirin EC  81 mg Oral Daily  . aspirin  325 mg Oral STAT  . benazepril  20 mg Oral Daily  . levothyroxine  75 mcg Oral QAC breakfast  . lovastatin  40 mg Oral q1800  . metoprolol succinate  50 mg Oral Daily  . multivitamin with minerals  1 tablet Oral BID  . pantoprazole  80 mg Oral Daily  . prasugrel  10 mg Oral Daily   Infusions:  . heparin 1,000 Units/hr (05/28/14 2233)  . nitroGLYCERIN Stopped (05/29/14 0412)    Labs:  Recent Labs  05/28/14 1851 05/29/14 0536  NA 139 138  K 4.6 4.2  CL 100 101  CO2 25  25  GLUCOSE 97 99  BUN 17 14  CREATININE 1.19* 0.89  CALCIUM 8.9 8.5     Recent Labs  05/28/14 1851 05/29/14 0536  WBC 7.2 5.4  HGB 11.4* 10.3*  HCT 33.4* 30.4*  MCV 89.3 90.2  PLT 250 214    Recent Labs  05/28/14 1851 05/28/14 2150 05/29/14 0350  TROPONINI <0.30 <0.30 <0.30     Radiology/Studies:  Dg Chest Port 1 View  05/28/2014   CLINICAL DATA:  Chest pain  EXAM: PORTABLE CHEST - 1 VIEW  COMPARISON:  10/01/2013  FINDINGS: There is no focal parenchymal opacity, pleural effusion, or pneumothorax. There is stable cardiomegaly. There is evidence of prior CABG.  The osseous structures are unremarkable.  IMPRESSION: No active disease.   Electronically Signed   By: Elige Ko   On: 05/28/2014 20:00     Assessment and Plan  36F CAD s/p CABG 2005 with PCI since (LIMA to diag, occluded SVG to LAD, occluded SVG-OM, PCI to SVG to RCA on 05/24/14), anxiety, and former tobacco abuse who presents with atypical chest pain.   1. Atypical chest pain with multiple social stressors 2. CAD as above 3. H/o ICM EF 40% in 2012, improved to 50-55% by cath 05/2014 4. Chronic anemia, should f/u PCP 5. Sinus bradycardia, asymptomatic, suspect due in part to OSA  Given that troponins have remained negative and EKG unremarkable, agree that CP is more  likely related to anxiety issues than stent issue or new epicardial or graft lesion. Continue aspirin, BB, statin, Effient. Will discuss further plans with MD but suspect we can stop heparin and possible DC with outpatient surveillance for continued symptoms. No s/sx of DVT/PE. Awaiting social work consult this AM to see if there are any resources to help her with recent social stressors. She was instructed to f/u PCP for anemia and OSA rx.  Signed, Ronie Spies PA-C   History and all data above reviewed.  Patient examined.  I agree with the findings as above.  No objective evidence of ischemia.  No SOB.  Lots of social stressThe patient exam reveals  COR:RRR  ,  Lungs: .clear  ,  Abd: Positive bowel sounds, no rebound no guarding, Ext No edema   .  All available labs, radiology testing, previous records reviewed. Agree with documented assessment and plan. Chest pain:  Atypical.  OK to go home.    Rollene Rotunda  10:38 AM  05/29/2014

## 2014-05-29 NOTE — Progress Notes (Signed)
UR completed 

## 2014-05-29 NOTE — Discharge Summary (Signed)
Discharge Summary   Patient ID: TENEE WISH MRN: [id number], DOB/AGE: [birth year] 58 y.o. Admit date: 05/28/2014 D/C date:     05/29/2014  Primary Care Provider: Alonza Bogus, MD Primary Cardiologist: Dr. Harl Bowie  Primary Discharge Diagnoses:  1. Atypical chest pain with multiple social stressors, suspect due to anxiety 2. CAD - history: a. s/p prior CABG 2005. b. BMS 02/2009 to VG-PDA. c. NSTEMI 08/2010 s/p PTCA/DES to SVG-PDA in previously stented area. d. DES to SVG to RCA on 05/24/14 3. H/o ICM EF 40% in 2012, improved to 50-55% by cath 05/2014  4. Chronic anemia, should f/u PCP  5. Sinus bradycardia, asymptomatic, suspect due in part to untreated OSA 6. Depression/anxiety  Secondary Discharge Diagnoses:  . Hypertension   . Hyperlipidemia   . GERD (gastroesophageal reflux disease)   . Diverticulitis     4 documented episodes by CT; most recent April 2012  . Hypothyroidism   . MI (myocardial infarction) 2005; 2009; 08/2010 X 2  . COPD (chronic obstructive pulmonary disease)   . Asthma   . Sleep apnea     a. Mild-to-moderate obstructive sleep apnea diagnosed in April 2012 by sleep study.  . Anemia     "related to chole OR"  . History of blood transfusion     "several times; w/chole; pneumonia, diverticulitis"  . Tension headache   . Seizures     "stress related"  . Arthritis   . Chronic back pain   . DDD (degenerative disc disease)   . Pneumonia 04/2011  . Kidney stones   . Acute renal failure ~ 04/2011    "when I had pneumonia"  . Carotid artery disease     a. Duplex 50-69% BICA 03/2014.    Hospital Course: [name] is a 58 y/o F with history of CAD s/p CABG 2005 with PCI since (LIMA to diag, occluded SVG to LAD, occluded SVG-OM, recent PCI to SVG to RCA on 05/24/14), anxiety, and former tobacco abuse who presented to [facility] with atypical chest pain. At 2:30am on day of admission, she awoke in a sweat and just "didn't feel right." She remained awake until  8am at which point she dozed off until 10am. She described worsened symptoms that she described as a heaviness in her arms all over with a feeling of fatigue and lightheadedness. She also described a sharper pain that lasted very briefly but recurred multiple times. She has not missed any dose of aspirin or prasugrel. She reported that the above syndrome is different from the pain prior to her recent PCI (05/24/14). She noted that she had it once years ago in the setting of personal stressors (husband invited a stranger he met while driving a truck to live at home with them) but also thinks she had an MI around the time of those symptoms. She reports that she is experiencing a significant amount of stress at this time related her her husband who is currently incarcerated and she is divorcing. She reports that last night she was on the phone with her husbands cousin and the discussion was very stressful. Although she takes Xanax PRN she did not take any last night or this morning. She reported to the ED where she was hemodynamically stable. ECG was unchanged from prior. She was given 31m ASA and started on IV heparin and IV nitro. Cardiology was consulted for admission. On fellow's interview she became very anxious and nearly tearful while talking about the issues with her husband. Denied  SI/HI. It was suspected that her chest discomfort was more likely related to anxiety than an issue with the stent issue (or new epicardial or graft lesion). Her NTG drip was titrated off and she had no further CP. Troponins remained negative overnight x 5. She has no further CP this AM. She had mild sinus bradycardia overnight on telemetry likely due to untreated OSA. She does not have any tachycardia, tachypnea or dyspnea. POx has ranged 98-100% on RA yesterday and day of discharge. Cardiac cath site was without hematoma; she had mild ecchymosis that looks to be in the early stages of resolution, good radial pulse. Dr. Percival Spanish  has seen and examined the patient today and feels she is stable for discharge. She was offered social work consult to assist with crisis counseling but declined.  Of note, Hgb was slightly decreased at 10.3 - has a h/o anemia but denied any recent bleeding problems. She was instructed to f/u PCP for this, as well as to set up treatment of OSA.  Discharge Vitals: Blood pressure 135/64, pulse 67, temperature 97.8 F (36.6 C), temperature source Oral, resp. rate 18, height '5\' 4"'  (1.626 m), weight 239 lb 10.2 oz (108.7 kg), SpO2 98.00%.  Labs: Lab Results  Component Value Date   WBC 5.4 05/29/2014   HGB 10.3* 05/29/2014   HCT 30.4* 05/29/2014   MCV 90.2 05/29/2014   PLT 214 05/29/2014     Recent Labs Lab 05/29/14 0536  NA 138  K 4.2  CL 101  CO2 25  BUN 14  CREATININE 0.89  CALCIUM 8.5  GLUCOSE 99    Recent Labs  05/28/14 1851 05/28/14 2150 05/29/14 0350 05/29/14 0920  TROPONINI <0.30 <0.30 <0.30 <0.30   Lab Results  Component Value Date   CHOL 196 05/29/2014   HDL 34* 05/29/2014   LDLCALC 93 05/29/2014   TRIG 343* 05/29/2014    Diagnostic Studies/Procedures   Dg Chest Port 1 View 05/28/2014   CLINICAL DATA:  Chest pain  EXAM: PORTABLE CHEST - 1 VIEW  COMPARISON:  10/01/2013  FINDINGS: There is no focal parenchymal opacity, pleural effusion, or pneumothorax. There is stable cardiomegaly. There is evidence of prior CABG.  The osseous structures are unremarkable.  IMPRESSION: No active disease.   Electronically Signed   By: Kathreen Devoid   On: 05/28/2014 20:00    Discharge Medications   Current Discharge Medication List    CONTINUE these medications which have NOT CHANGED   Details  albuterol (PROAIR HFA) 108 (90 BASE) MCG/ACT inhaler Inhale 2 puffs into the lungs every 6 (six) hours as needed for wheezing or shortness of breath.    albuterol (PROVENTIL) (2.5 MG/3ML) 0.083% nebulizer solution Take 2.5 mg by nebulization every 6 (six) hours as needed for wheezing or  shortness of breath.    ALPRAZolam (XANAX) 1 MG tablet Take 1 mg by mouth 4 (four) times daily as needed for anxiety or sleep.     amLODipine (NORVASC) 10 MG tablet Take 10 mg by mouth daily.    aspirin EC 81 MG tablet Take 81 mg by mouth daily.    benazepril (LOTENSIN) 20 MG tablet Take 20 mg by mouth daily.    furosemide (LASIX) 80 MG tablet Take 80 mg by mouth daily as needed for fluid or edema (swelling).    HYDROcodone-acetaminophen (NORCO) 10-325 MG per tablet Take 1 tablet by mouth every 4 (four) hours as needed for moderate pain (pain).     levothyroxine (SYNTHROID, LEVOTHROID) 75 MCG  tablet Take 75 mcg by mouth daily before breakfast.     lovastatin (MEVACOR) 40 MG tablet Take 40 mg by mouth at bedtime.     metoprolol succinate (TOPROL-XL) 50 MG 24 hr tablet Take 50 mg by mouth daily. Take with or immediately following a meal.    Multiple Vitamin (MULTIVITAMIN WITH MINERALS) TABS tablet Take 1 tablet by mouth 2 (two) times daily.    nitroGLYCERIN (NITROSTAT) 0.4 MG SL tablet Place 0.4 mg under the tongue every 5 (five) minutes as needed for chest pain. If no relief in 15 minutes call 911    omeprazole (PRILOSEC) 20 MG capsule Take 1 capsule (20 mg total) by mouth daily.     potassium chloride SA (K-DUR,KLOR-CON) 20 MEQ tablet Take 20 mEq by mouth daily as needed (take with lasix (furosemide)). Take when potassium is low    prasugrel (EFFIENT) 10 MG TABS tablet Take 1 tablet (10 mg total) by mouth daily.         Disposition   The patient will be discharged in stable condition to home. Discharge Instructions   Diet - low sodium heart healthy    Complete by:  As directed      Increase activity slowly    Complete by:  As directed           Follow-up Information   Follow up with HAWKINS,EDWARD L, MD. (Please follow up with your primary care doctor to monitor your anemia, and to set up treatment for your sleep apnea)    Specialty:  Pulmonary Disease   Contact  information:   [place] [address] [phone]       Follow up with Jory Sims, NP. (06/08/14 at 1:10pm)    Specialty:  Nurse Practitioner   Contact information:   [place] [address] [phone]         Duration of Discharge Encounter: Greater than 30 minutes including physician and PA time.  Signed, Melina Copa PA-C 05/29/2014, 11:35 AM  Patient seen and examined.  Plan as discussed in my rounding note for today and outlined above. Jeneen Rinks [facility]  05/29/2014  12:22 PM

## 2014-05-30 ENCOUNTER — Ambulatory Visit (HOSPITAL_COMMUNITY): Payer: Self-pay | Admitting: Psychiatry

## 2014-06-05 ENCOUNTER — Other Ambulatory Visit: Payer: Self-pay | Admitting: Adult Health

## 2014-06-07 ENCOUNTER — Telehealth: Payer: Self-pay | Admitting: *Deleted

## 2014-06-07 NOTE — Telephone Encounter (Signed)
Pt has appointment with Joni Reining 06/08/14 and she can not come out in hot weather makes her sick. She wants to talk to Samara Deist and see if she has any suggestions. States she may need to change appointment but want to talk to her first/

## 2014-06-07 NOTE — Telephone Encounter (Signed)
Pt has no air conditioning in car,needs to reschedule apt tomorrow with [facility] NP

## 2014-06-08 ENCOUNTER — Encounter: Payer: Self-pay | Admitting: Adult Health

## 2014-06-14 ENCOUNTER — Encounter: Payer: Self-pay | Admitting: Adult Health

## 2014-06-14 ENCOUNTER — Ambulatory Visit (INDEPENDENT_AMBULATORY_CARE_PROVIDER_SITE_OTHER): Payer: Medicaid Other | Admitting: Adult Health

## 2014-06-14 VITALS — BP 112/70 | HR 72 | Ht 64.0 in | Wt 237.0 lb

## 2014-06-14 DIAGNOSIS — J449 Chronic obstructive pulmonary disease, unspecified: Secondary | ICD-10-CM

## 2014-06-14 DIAGNOSIS — J4489 Other specified chronic obstructive pulmonary disease: Secondary | ICD-10-CM

## 2014-06-14 DIAGNOSIS — I10 Essential (primary) hypertension: Secondary | ICD-10-CM

## 2014-06-14 DIAGNOSIS — I2510 Atherosclerotic heart disease of native coronary artery without angina pectoris: Secondary | ICD-10-CM

## 2014-06-14 NOTE — Progress Notes (Deleted)
Name: [name]    DOB: 09-25-1956  Age: 58 y.o.  MR#: [id number]       PCP:  Fredirick Maudlin, MD      Insurance: Payor: MEDICAID [place] / Plan: MEDICAID [place] ACCESS / Product Type: *No Product type* /   CC:    Chief Complaint  Patient presents with  . Coronary Artery Disease  . Cardiomyopathy    VS Filed Vitals:   06/14/14 1353  BP: 112/70  Pulse: 72  Height: 5\' 4"  (1.626 m)  Weight: 237 lb (107.502 kg)  SpO2: 96%    Weights Current Weight  06/14/14 237 lb (107.502 kg)  05/29/14 239 lb 10.2 oz (108.7 kg)  05/25/14 238 lb 5.1 oz (108.1 kg)    Blood Pressure  BP Readings from Last 3 Encounters:  06/14/14 112/70  05/29/14 135/64  05/25/14 136/91     Admit date:  (Not on file) Last encounter with RMR:  06/05/2014   Allergy Adhesive; Crestor; Lipitor; and Morphine and related  Current Outpatient Prescriptions  Medication Sig Dispense Refill  . albuterol (PROAIR HFA) 108 (90 BASE) MCG/ACT inhaler Inhale 2 puffs into the lungs every 6 (six) hours as needed for wheezing or shortness of breath.      Marland Kitchen albuterol (PROVENTIL) (2.5 MG/3ML) 0.083% nebulizer solution Take 2.5 mg by nebulization every 6 (six) hours as needed for wheezing or shortness of breath.      . ALPRAZolam (XANAX) 1 MG tablet Take 1 mg by mouth 4 (four) times daily as needed for anxiety or sleep.       Marland Kitchen amLODipine (NORVASC) 10 MG tablet Take 10 mg by mouth daily.      Marland Kitchen aspirin EC 81 MG tablet Take 81 mg by mouth daily.      . benazepril (LOTENSIN) 20 MG tablet Take 20 mg by mouth daily.      . furosemide (LASIX) 80 MG tablet Take 80 mg by mouth daily as needed for fluid or edema (swelling).      Marland Kitchen HYDROcodone-acetaminophen (NORCO) 10-325 MG per tablet Take 1 tablet by mouth every 4 (four) hours as needed for moderate pain (pain).       Marland Kitchen levothyroxine (SYNTHROID, LEVOTHROID) 75 MCG tablet Take 75 mcg by mouth daily before breakfast.       . lovastatin (MEVACOR) 40 MG tablet Take 40 mg by mouth at  bedtime.       . metoprolol succinate (TOPROL-XL) 50 MG 24 hr tablet Take 50 mg by mouth daily. Take with or immediately following a meal.      . Multiple Vitamin (MULTIVITAMIN WITH MINERALS) TABS tablet Take 1 tablet by mouth 2 (two) times daily.  25 tablet  4  . nitroGLYCERIN (NITROSTAT) 0.4 MG SL tablet Place 0.4 mg under the tongue every 5 (five) minutes as needed for chest pain. If no relief in 15 minutes call 911      . omeprazole (PRILOSEC) 20 MG capsule TAKE ONE BY MOUTH CAPSULE DAILY.  90 capsule  3  . potassium chloride SA (K-DUR,KLOR-CON) 20 MEQ tablet Take 20 mEq by mouth daily as needed (take with lasix (furosemide)). Take when potassium is low      . prasugrel (EFFIENT) 10 MG TABS tablet Take 1 tablet (10 mg total) by mouth daily.  30 tablet  11  . zolpidem (AMBIEN) 5 MG tablet Take 5 mg by mouth at bedtime.      . [DISCONTINUED] carbamazepine (TEGRETOL XR) 200 MG 12 hr tablet  Take 200 mg by mouth 2 (two) times daily.        . [DISCONTINUED] lamoTRIgine (LAMICTAL) 100 MG tablet Take 100 mg by mouth 2 (two) times daily.         No current facility-administered medications for this visit.    Discontinued Meds:   There are no discontinued medications.  Patient Active Problem List   Diagnosis Date Noted  . Chest pain 05/28/2014  . Atherosclerotic heart disease of native coronary artery with angina pectoris 05/23/2014    Class: Diagnosis of  . Coronary artery disease involving autologous vein coronary bypass graft with other forms of angina pectoris 05/23/2014    Class: Chronic  . C. difficile colitis 10/03/2013  . 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 LLQ abdominal pain 12/02/2011  . Epigastric pain 04/19/2011  . Anemia 04/19/2011  . Diverticulitis 01/15/2011  . DIVERTICULITIS OF COLON 07/14/2010  . DIARRHEA, CHRONIC 07/14/2010  . CORONARY ATHEROSCLEROSIS NATIVE CORONARY ARTERY 01/23/2010  . HEADACHE 01/23/2010  . Hyperlipidemia with target  LDL less than 70 05/27/2009  . ANXIETY 05/27/2009  . Essential hypertension 05/27/2009  . CAROTID OCCLUSIVE DISEASE 05/27/2009  . COPD 05/27/2009  . GERD 05/27/2009  . SEIZURE DISORDER 05/27/2009    LABS    Component Value Date/Time   NA 138 05/29/2014 0536   NA 139 05/28/2014 1851   NA 142 05/25/2014 0452   K 4.2 05/29/2014 0536   K 4.6 05/28/2014 1851   K 4.6 05/25/2014 0452   CL 101 05/29/2014 0536   CL 100 05/28/2014 1851   CL 104 05/25/2014 0452   CO2 25 05/29/2014 0536   CO2 25 05/28/2014 1851   CO2 28 05/25/2014 0452   GLUCOSE 99 05/29/2014 0536   GLUCOSE 97 05/28/2014 1851   GLUCOSE 104* 05/25/2014 0452   BUN 14 05/29/2014 0536   BUN 17 05/28/2014 1851   BUN 19 05/25/2014 0452   CREATININE 0.89 05/29/2014 0536   CREATININE 1.19* 05/28/2014 1851   CREATININE 0.90 05/25/2014 0452   CREATININE 1.07 12/02/2011 1140   CREATININE 0.91 04/16/2011 1212   CALCIUM 8.5 05/29/2014 0536   CALCIUM 8.9 05/28/2014 1851   CALCIUM 9.4 05/25/2014 0452   GFRNONAA 70* 05/29/2014 0536   GFRNONAA 49* 05/28/2014 1851   GFRNONAA 69* 05/25/2014 0452   GFRAA 81* 05/29/2014 0536   GFRAA 57* 05/28/2014 1851   GFRAA 80* 05/25/2014 0452   CMP     Component Value Date/Time   NA 138 05/29/2014 0536   K 4.2 05/29/2014 0536   CL 101 05/29/2014 0536   CO2 25 05/29/2014 0536   GLUCOSE 99 05/29/2014 0536   BUN 14 05/29/2014 0536   CREATININE 0.89 05/29/2014 0536   CREATININE 1.07 12/02/2011 1140   CALCIUM 8.5 05/29/2014 0536   PROT 8.5* 04/16/2011 1208   ALBUMIN 4.4 04/16/2011 1208   AST 21 04/16/2011 1208   ALT 22 04/16/2011 1208   ALKPHOS 101 04/16/2011 1208   BILITOT 0.2* 04/16/2011 1208   GFRNONAA 70* 05/29/2014 0536   GFRAA 81* 05/29/2014 0536       Component Value Date/Time   WBC 5.4 05/29/2014 0536   WBC 7.2 05/28/2014 1851   WBC 6.1 05/25/2014 0452   HGB 10.3* 05/29/2014 0536   HGB 11.4* 05/28/2014 1851   HGB 11.3* 05/25/2014 0452   HCT 30.4* 05/29/2014 0536   HCT 33.4* 05/28/2014 1851   HCT 33.9* 05/25/2014 0452    MCV 90.2 05/29/2014 0536  MCV 89.3 05/28/2014 1851   MCV 91.1 05/25/2014 0452    Lipid Panel     Component Value Date/Time   CHOL 196 05/29/2014 0536   TRIG 343* 05/29/2014 0536   HDL 34* 05/29/2014 0536   CHOLHDL 5.8 05/29/2014 0536   VLDL 69* 05/29/2014 0536   LDLCALC 93 05/29/2014 0536    ABG    Component Value Date/Time   PHART  Value: 7.400 CORRECTED ON 04/16 AT 1535: PREVIOUSLY REPORTED AS 7.424 01/19/2011 1110   PCO2ART  Value: 38.3 CORRECTED ON 04/16 AT 1535: PREVIOUSLY REPORTED AS 39.0 01/19/2011 1110   PO2ART  Value: 75.2 CORRECTED ON 04/16 AT 1535: PREVIOUSLY REPORTED AS 80.4* 01/19/2011 1110   HCO3  Value: 23.2 CORRECTED ON 04/16 AT 1535: PREVIOUSLY REPORTED AS 25.1 01/19/2011 1110   TCO2  Value: 21.1 CORRECTED ON 04/16 AT 1535: PREVIOUSLY REPORTED AS 22.6 01/19/2011 1110   ACIDBASEDEF 0.9 01/19/2011 1110   O2SAT  Value: 95.6 CORRECTED ON 04/16 AT 1535: PREVIOUSLY REPORTED AS 96.2 01/19/2011 1110     Lab Results  Component Value Date   TSH 6.835* 10/01/2013   BNP (last 3 results)  Recent Labs  10/01/13 2230 05/28/14 1851  PROBNP 1027.0* 191.9*   Cardiac Panel (last 3 results) No results found for this basename: CKTOTAL, CKMB, TROPONINI, RELINDX,  in the last 72 hours  Iron/TIBC/Ferritin/ %Sat No results found for this basename: iron, tibc, ferritin, ironpctsat     EKG Orders placed during the hospital encounter of 05/28/14  . ED EKG  . ED EKG  . EKG 12-LEAD  . EKG 12-LEAD  . EKG 12-LEAD  . EKG     Prior Assessment and Plan Problem List as of 06/14/2014     Cardiovascular and Mediastinum   Essential hypertension   Last Assessment & Plan   02/15/2012 Office Visit Written 02/15/2012  2:38 PM by Gaylord Shih, MD     This is probably secondary to having a cold not feeling well. Issues under good control. Encouraged to take her meds and watch salt.    CORONARY ATHEROSCLEROSIS NATIVE CORONARY ARTERY   Last Assessment & Plan   02/15/2012 Office Visit Written  02/15/2012  2:37 PM by Gaylord Shih, MD     Improved. No change in secondary preventive strategy. See [place] back in one year.    Angina, class III - progressed despite medical therapy   Atherosclerotic heart disease of native coronary artery with angina pectoris   Coronary artery disease involving autologous vein coronary bypass graft with other forms of angina pectoris   CAROTID OCCLUSIVE DISEASE   Last Assessment & Plan   02/15/2012 Office Visit Edited 02/15/2012  2:41 PM by Gaylord Shih, MD     Asymptomatic. Continue aggressive secondary prevention. We'll arrange for carotid Doppler since she is overdue.      Respiratory   COPD   Last Assessment & Plan   01/07/2011 Office Visit Written 01/07/2011  4:43 PM by Jodelle Gross, NP     I have referred her to Dr. [name], pulmonologist for further testing. I have no record of recent PFT's and she states she has not done any.  She may need a sleep study as well.  This will be evaluated and recommendations made by Dr. [name].      Digestive   GERD   DIVERTICULITIS OF COLON   C. difficile colitis     Other   Hyperlipidemia with target LDL less than 70   ANXIETY  Last Assessment & Plan   01/07/2011 Office Visit Written 01/07/2011  4:44 PM by Jodelle Gross, NP     She is to seek medical attention for her multiple somatic complaints. I have given her a list of names of physicians which accept medicaid.  No further medications are provided.    SEIZURE DISORDER   HEADACHE   DIARRHEA, CHRONIC   Diverticulitis   Last Assessment & Plan   04/16/2011 Office Visit Edited 04/19/2011  1:08 AM by Nira Retort, NP     58 year old Caucasian female with 4 documented episodes of diverticulitis, most recently in April 2012. No prior hx of colonoscopy. Diverticulitis has resolved since last episode, s/p abx. Needs colonoscopy for lower GI tract evaluation; likely will be referred for elective surgical intervention due to continued recurrence.  Proceed with  TCS with Dr. Darrick Penna in near future: the risks, benefits, and alternatives have been discussed with the patient in detail. The patient states understanding and desires to proceed. We will utilize the assistance of anesthesia, under propofol, due to pt's hx of drug abuse in remote past and polypharmacy.      Epigastric pain   Last Assessment & Plan   04/16/2011 Office Visit Edited 04/19/2011  1:14 AM by Nira Retort, NP     Several week hx of epigastric and RUQ pain, without precipitating or relieving factors, associated with N/V. Recently underwent cardiac eval in June 2012 due to similar symptoms. Negative work-up. Pt has lost approximately 12 lbs since last visit in April. Hx of cholecystectomy. Per pt's report, under significant stress since pt left husband. Differentials include gastritis, PUD, hepatobiliary/pancreatic etiology less likely; question of stressors causing exacerbation of symptoms. Will evaluated with labs and proceed with upper endoscopy.  [place] abd today HFP, lipase, BMP, CBC today EGD with Dr. Darrick Penna along with TCS. The R/B/A have been discussed in detail with pt, and she states understanding. We will utilize the assistance of anesthesia for sedation due to pt's hx of drug use in the past as well as polypharmacy.     Anemia   Last Assessment & Plan   04/16/2011 Office Visit Written 04/19/2011  1:21 AM by Nira Retort, NP     Evaluated in 2007 by Dr. Darrick Penna for anemia while inpatient at [facility]; was recommended to return outpatient for EGD/colonoscopy. Pt never completed. Most recent Hgb from June 10.4. Appears normocytic anemia, question early iron deficiency anemia. EGD and colonoscopy to be completed in near future. Do not have hemoccult status. Will obtain ifobt and anemia panel in the interim prior to procedures.  ifobt Anemia panel EGD/TCS in near future    LLQ abdominal pain   Last Assessment & Plan   12/02/2011 Office Visit Edited 12/02/2011 12:04 PM by [place] Bali, MD      DIVERTICULITIS V. IBS-MIXED PATTERN. PT FAILED TO SHOW FOR LAST 2 APPT.  CT ORDERED FOR TODAY. PT ELECTED TO WAIT UNTIL TOMORROW. TCS IN 2-3 WEEKS WITH PROPOFOL DUE TO POLYPHARMACY. FOLLOW UP IN 6 MOS. REFER TO DR. Lovell Sheehan FOR L HEMICOLECTOMY    Chest pain       Imaging: Dg Chest Port 1 View  05/28/2014   CLINICAL DATA:  Chest pain  EXAM: PORTABLE CHEST - 1 VIEW  COMPARISON:  10/01/2013  FINDINGS: There is no focal parenchymal opacity, pleural effusion, or pneumothorax. There is stable cardiomegaly. There is evidence of prior CABG.  The osseous structures are unremarkable.  IMPRESSION: No active disease.  Electronically Signed   By: Elige Ko   On: 05/28/2014 20:00

## 2014-06-14 NOTE — Patient Instructions (Signed)
Your physician wants you to follow-up in: 6 months You will receive a reminder letter in the mail two months in advance. If you don't receive a letter, please call our office to schedule the follow-up appointment.    Your physician recommends that you continue on your current medications as directed. Please refer to the Current Medication list given to you today.   You have been referred to cardiac rehab     Thank you for choosing [facility] !

## 2014-06-14 NOTE — Assessment & Plan Note (Signed)
She continues on DAPT and is tolerating all cardiac medications. She has stopped smoking. Stress level is down significantly. She is becoming more active. She is re-referred to cardiac rehab to be established with them and begin program. Will see her again in 6 months unless symptomatic.

## 2014-06-14 NOTE — Assessment & Plan Note (Signed)
She has quit smoking. She is congratulated on this and encouraged to continue this lifestyle change.

## 2014-06-14 NOTE — Assessment & Plan Note (Signed)
Blood pressure is well controlled. No changes at this time in medications.

## 2014-06-14 NOTE — Progress Notes (Signed)
HPI: [name] is a 58 year old patient of Dr. branch that we are following for ongoing assessment and management of CAD, history of CABG, multiple areas of stent placement with most recent drug-eluting stent to the SVG to RCA on 05/24/2014, ischemic heart myopathy with an EF of 50-55% per cath in August of 2015, hypertension, hyperlipidemia, with multiple medical problems. She was recently admitted to town hospital in the setting of recurrent chest pain, and August of 2015. She had a repeat cardiac catheterization.  The patient has significant anxiety attack along depression. She was ruled out for myocardial infarction. Social services is recommended to assess for counseling, but she declined it. She was continued on her current medication regimen without changes.  She has stopped smoking since April of 2015. She is now separated from her husband, and therefore stress level has decreased significantly. She is walking daily but wishes to be re-referred to cardiac rehab as she was re-admitted to hospital and did not get to rehab.   She offers no complaints today. Allergies  Allergen Reactions  . Adhesive [Tape] Itching and Other (See Comments)    Blisters (pls use paper tape)  . Crestor [Rosuvastatin] Other (See Comments)    Muscle pain  . Lipitor [Atorvastatin] Other (See Comments)    Cramps  . Morphine And Related Itching and Nausea And Vomiting    Current Outpatient Prescriptions  Medication Sig Dispense Refill  . albuterol (PROAIR HFA) 108 (90 BASE) MCG/ACT inhaler Inhale 2 puffs into the lungs every 6 (six) hours as needed for wheezing or shortness of breath.      Marland Kitchen albuterol (PROVENTIL) (2.5 MG/3ML) 0.083% nebulizer solution Take 2.5 mg by nebulization every 6 (six) hours as needed for wheezing or shortness of breath.      . ALPRAZolam (XANAX) 1 MG tablet Take 1 mg by mouth 4 (four) times daily as needed for anxiety or sleep.       Marland Kitchen amLODipine (NORVASC) 10 MG tablet Take 10 mg by  mouth daily.      Marland Kitchen aspirin EC 81 MG tablet Take 81 mg by mouth daily.      . benazepril (LOTENSIN) 20 MG tablet Take 20 mg by mouth daily.      . furosemide (LASIX) 80 MG tablet Take 80 mg by mouth daily as needed for fluid or edema (swelling).      Marland Kitchen HYDROcodone-acetaminophen (NORCO) 10-325 MG per tablet Take 1 tablet by mouth every 4 (four) hours as needed for moderate pain (pain).       Marland Kitchen levothyroxine (SYNTHROID, LEVOTHROID) 75 MCG tablet Take 75 mcg by mouth daily before breakfast.       . lovastatin (MEVACOR) 40 MG tablet Take 40 mg by mouth at bedtime.       . metoprolol succinate (TOPROL-XL) 50 MG 24 hr tablet Take 50 mg by mouth daily. Take with or immediately following a meal.      . Multiple Vitamin (MULTIVITAMIN WITH MINERALS) TABS tablet Take 1 tablet by mouth 2 (two) times daily.  25 tablet  4  . nitroGLYCERIN (NITROSTAT) 0.4 MG SL tablet Place 0.4 mg under the tongue every 5 (five) minutes as needed for chest pain. If no relief in 15 minutes call 911      . omeprazole (PRILOSEC) 20 MG capsule TAKE ONE BY MOUTH CAPSULE DAILY.  90 capsule  3  . potassium chloride SA (K-DUR,KLOR-CON) 20 MEQ tablet Take 20 mEq by mouth daily as needed (take with lasix (  furosemide)). Take when potassium is low      . prasugrel (EFFIENT) 10 MG TABS tablet Take 1 tablet (10 mg total) by mouth daily.  30 tablet  11  . zolpidem (AMBIEN) 5 MG tablet Take 5 mg by mouth at bedtime.      . [DISCONTINUED] carbamazepine (TEGRETOL XR) 200 MG 12 hr tablet Take 200 mg by mouth 2 (two) times daily.        . [DISCONTINUED] lamoTRIgine (LAMICTAL) 100 MG tablet Take 100 mg by mouth 2 (two) times daily.         No current facility-administered medications for this visit.    Past Medical History  Diagnosis Date  . Coronary artery disease     a. s/p prior CABG 2005. b. BMS 02/2009 to VG-PDA. c. NSTEMI 08/2010 s/p PTCA/DES to SVG-PDA in previously stented area. d. DES to SVG to RCA on 05/24/14  . Hypertension   .  Hyperlipidemia   . GERD (gastroesophageal reflux disease)   . Diverticulitis     4 documented episodes by CT; most recent April 2012  . Depression   . Anxiety   . Hypothyroidism   . MI (myocardial infarction) 2005; 2009; 08/2010 X 2  . COPD (chronic obstructive pulmonary disease)   . Asthma   . Sleep apnea     a. Mild-to-moderate obstructive sleep apnea diagnosed in April 2012 by sleep study.  . Anemia     "related to chole OR"  . History of blood transfusion     "several times; w/chole; pneumonia, diverticulitis"  . Tension headache   . Seizures     "stress related"  . Arthritis   . Chronic back pain   . DDD (degenerative disc disease)   . Pneumonia 04/2011  . Kidney stones   . Acute renal failure ~ 04/2011    "when I had pneumonia"  . Ischemic cardiomyopathy     a. EF 40% in 2012 by cath. b. improved to 50-55% by cath 05/2014   . Carotid artery disease     a. Duplex 50-69% BICA 03/2014.  Marland Kitchen Sinus bradycardia     Past Surgical History  Procedure Laterality Date  . Coronary artery bypass graft  2005    LIMA to AL, SVG to LAD, SVG to Cx, SVG-PDA  . Carotid endarterectomy  2005  . Knee arthroscopy w/ acl reconstruction Left 2001  . Carpal tunnel with cubital tunnel Right 2001  . Coronary angioplasty with stent placement  2011    Promus Premier DES, 3.5 mm x 12 mm, for 99% proximal stent ISR in the SVG-RPDA   . Cardiac catheterization  Jan 2012    patent stents in RCA graft; CAD stable  . Coronary angioplasty with stent placement  05/24/2014  . Appendectomy  ~ 1970  . Laparoscopic cholecystectomy  2010  . Vaginal hysterectomy  ~ 1993  . Tubal ligation  ~ 1989    ROS: Review of systems complete and found to be negative unless listed above  PHYSICAL EXAM BP 112/70  Pulse 72  Ht  (1.626 m)  Wt 237 lb (107.502 kg)  BMI 40.66 kg/m2  SpO2 96% General: Well developed, well nourished, in no acute distress Head: Eyes PERRLA, No xanthomas.   Normal cephalic and  atramatic  Lungs: Clear bilaterally to auscultation and percussion. Heart: HRRR S1 S2, without MRG.  Pulses are 2+ & equal.            No carotid bruit. No JVD.  No abdominal bruits. No femoral bruits. Abdomen: Bowel sounds are positive, abdomen soft and non-tender without masses or                  Hernia's noted. Msk:  Back normal, normal gait. Normal strength and tone for age. Extremities: No clubbing, cyanosis or edema.  DP +1 Neuro: Alert and oriented X 3. Psych:  Good affect, responds appropriately   ASSESSMENT AND PLAN

## 2014-06-18 ENCOUNTER — Encounter: Payer: Self-pay | Admitting: *Deleted

## 2014-06-18 ENCOUNTER — Telehealth: Payer: Self-pay | Admitting: *Deleted

## 2014-06-18 ENCOUNTER — Telehealth: Payer: Self-pay | Admitting: Adult Health

## 2014-06-18 NOTE — Telephone Encounter (Signed)
Noted. Thanks.

## 2014-06-18 NOTE — Telephone Encounter (Signed)
Pt is stating that she having chest pain and dizziness. Advised to go to the ED with those symptoms. Pt said she did not want to go to the ED and would wait for another 15 mins, if still having CP and dizziness that she would come here. Advised pt not to wait and to go straight to the ED. Pt understood.

## 2014-06-18 NOTE — Telephone Encounter (Signed)
Spoke to pt who was feeling better after taking nitro glycerin. Had not taken anxiety meds today. Will take xanax and continue to rest. Will go to the ED if symptoms return or new symptoms occur.   FYI to [employer]

## 2014-06-18 NOTE — Telephone Encounter (Signed)
Did patient take NTG or do other measures to relieve pain prior to calling?  Recommend that she take NTG, use her anti-anxiety medications, and rest. If pain continues,worsens, or is accompanied by dyspnea, diaphoresis, or nausea she should seek medical attention in ER. Otherwise, measures as described above are recommended.

## 2014-06-18 NOTE — Telephone Encounter (Signed)
Please call patient regarding some pain in chest / tgs

## 2014-07-25 ENCOUNTER — Other Ambulatory Visit: Payer: Self-pay | Admitting: Cardiology

## 2014-07-26 ENCOUNTER — Encounter (HOSPITAL_COMMUNITY): Payer: Medicaid Other

## 2014-08-27 ENCOUNTER — Other Ambulatory Visit: Payer: Self-pay | Admitting: Cardiology

## 2014-09-13 ENCOUNTER — Encounter (HOSPITAL_COMMUNITY): Payer: Self-pay | Admitting: Cardiology

## 2014-11-14 ENCOUNTER — Emergency Department (HOSPITAL_COMMUNITY)
Admission: EM | Admit: 2014-11-14 | Discharge: 2014-11-14 | Disposition: A | Discharge status: Home / Self Care | Payer: Medicaid Other | Attending: Emergency Medicine | Admitting: Emergency Medicine

## 2014-11-14 ENCOUNTER — Emergency Department (HOSPITAL_COMMUNITY): Payer: Medicaid Other

## 2014-11-14 ENCOUNTER — Encounter (HOSPITAL_COMMUNITY): Payer: Self-pay

## 2014-11-14 DIAGNOSIS — I2510 Atherosclerotic heart disease of native coronary artery without angina pectoris: Secondary | ICD-10-CM | POA: Diagnosis not present

## 2014-11-14 DIAGNOSIS — I252 Old myocardial infarction: Secondary | ICD-10-CM | POA: Diagnosis not present

## 2014-11-14 DIAGNOSIS — Z79899 Other long term (current) drug therapy: Secondary | ICD-10-CM | POA: Diagnosis not present

## 2014-11-14 DIAGNOSIS — M19032 Primary osteoarthritis, left wrist: Secondary | ICD-10-CM

## 2014-11-14 DIAGNOSIS — Z87448 Personal history of other diseases of urinary system: Secondary | ICD-10-CM | POA: Insufficient documentation

## 2014-11-14 DIAGNOSIS — Z87828 Personal history of other (healed) physical injury and trauma: Secondary | ICD-10-CM | POA: Diagnosis not present

## 2014-11-14 DIAGNOSIS — K219 Gastro-esophageal reflux disease without esophagitis: Secondary | ICD-10-CM | POA: Insufficient documentation

## 2014-11-14 DIAGNOSIS — Z87891 Personal history of nicotine dependence: Secondary | ICD-10-CM | POA: Insufficient documentation

## 2014-11-14 DIAGNOSIS — E785 Hyperlipidemia, unspecified: Secondary | ICD-10-CM | POA: Diagnosis not present

## 2014-11-14 DIAGNOSIS — Z8701 Personal history of pneumonia (recurrent): Secondary | ICD-10-CM | POA: Diagnosis not present

## 2014-11-14 DIAGNOSIS — F419 Anxiety disorder, unspecified: Secondary | ICD-10-CM | POA: Insufficient documentation

## 2014-11-14 DIAGNOSIS — G4730 Sleep apnea, unspecified: Secondary | ICD-10-CM | POA: Diagnosis not present

## 2014-11-14 DIAGNOSIS — I10 Essential (primary) hypertension: Secondary | ICD-10-CM | POA: Diagnosis not present

## 2014-11-14 DIAGNOSIS — M25532 Pain in left wrist: Secondary | ICD-10-CM | POA: Diagnosis present

## 2014-11-14 DIAGNOSIS — Z9861 Coronary angioplasty status: Secondary | ICD-10-CM | POA: Diagnosis not present

## 2014-11-14 DIAGNOSIS — G8929 Other chronic pain: Secondary | ICD-10-CM | POA: Insufficient documentation

## 2014-11-14 DIAGNOSIS — F329 Major depressive disorder, single episode, unspecified: Secondary | ICD-10-CM | POA: Diagnosis not present

## 2014-11-14 DIAGNOSIS — M1990 Unspecified osteoarthritis, unspecified site: Secondary | ICD-10-CM | POA: Insufficient documentation

## 2014-11-14 DIAGNOSIS — Z87442 Personal history of urinary calculi: Secondary | ICD-10-CM | POA: Diagnosis not present

## 2014-11-14 DIAGNOSIS — E039 Hypothyroidism, unspecified: Secondary | ICD-10-CM | POA: Diagnosis not present

## 2014-11-14 DIAGNOSIS — Z862 Personal history of diseases of the blood and blood-forming organs and certain disorders involving the immune mechanism: Secondary | ICD-10-CM | POA: Diagnosis not present

## 2014-11-14 DIAGNOSIS — Z951 Presence of aortocoronary bypass graft: Secondary | ICD-10-CM | POA: Diagnosis not present

## 2014-11-14 DIAGNOSIS — J449 Chronic obstructive pulmonary disease, unspecified: Secondary | ICD-10-CM | POA: Insufficient documentation

## 2014-11-14 MED ORDER — HYDROCODONE-ACETAMINOPHEN 5-325 MG PO TABS
1.0000 | ORAL_TABLET | Freq: Once | ORAL | Status: AC
  Administered 2014-11-14: 1 via ORAL
  Filled 2014-11-14: qty 1

## 2014-11-14 NOTE — Discharge Instructions (Signed)
Wrist Pain Wrist injuries are frequent in adults and children. A sprain is an injury to the ligaments that hold your bones together. A strain is an injury to muscle or muscle cord-like structures (tendons) from stretching or pulling. Generally, when wrists are moderately tender to touch following a fall or injury, a break in the bone (fracture) may be present. Most wrist sprains or strains are better in 3 to 5 days, but complete healing may take several weeks. HOME CARE INSTRUCTIONS   Put ice on the injured area.  Put ice in a plastic bag.  Place a towel between your skin and the bag.  Leave the ice on for 15-20 minutes, 3-4 times a day, for the first 2 days, or as directed by your health care provider.  Keep your arm raised above the level of your heart whenever possible to reduce swelling and pain.  Rest the injured area for at least 48 hours or as directed by your health care provider.  If a splint or elastic bandage has been applied, use it for as long as directed by your health care provider or until seen by a health care provider for a follow-up exam.  Only take over-the-counter or prescription medicines for pain, discomfort, or fever as directed by your health care provider.  Keep all follow-up appointments. You may need to follow up with a specialist or have follow-up X-rays. Improvement in pain level is not a guarantee that you did not fracture a bone in your wrist. The only way to determine whether or not you have a broken bone is by X-ray. SEEK IMMEDIATE MEDICAL CARE IF:   Your fingers are swollen, very red, white, or cold and blue.  Your fingers are numb or tingling.  You have increasing pain.  You have difficulty moving your fingers. MAKE SURE YOU:   Understand these instructions.  Will watch your condition.  Will get help right away if you are not doing well or get worse. Document Released: 07/01/2005 Document Revised: 09/26/2013 Document Reviewed:  11/12/2010 [facility] Patient Information 2015 [place], [place]. This information is not intended to replace advice given to you by your health care provider. Make sure you discuss any questions you have with your health care provider.   Your xrays suggest arthritis in your wrist as we reviewed, which could be worsened and/or at least partially resulting from your prior injury.  Continue using your hydrocodone as needed for pain.  Contact either Dr. Romeo AppleHarrison or Dr. Janee Mornhompson for further evaluation and management if your symptoms persist.

## 2014-11-14 NOTE — ED Notes (Signed)
Pt having wrist pain since December, patient of Dr. Lonzo CandyHawkings no imaging done, took prednisone December. Has not been seen by anyone since December.

## 2014-11-14 NOTE — ED Provider Notes (Signed)
CSN: [id number]     Arrival date & time 11/14/14  1553 History   First MD Initiated Contact with Patient 11/14/14 1608     Chief Complaint  Patient presents with  . Wrist Pain     (Consider location/radiation/quality/duration/timing/severity/associated sxs/prior Treatment) The history is provided by the patient.   [name] is a 59 y.o. female presenting with a now nearly 2 month history of left wrist pain since falling in her home, landing on her bilateral outstretched wrists.  She was seen by her pcp for this injury and was felt to have a wrist sprain or possibly tendon strain per patient report and was placed on prednisone.  Her right wrist healed, but she reports actually worsened pain now in the left wrist which is worsened with movement and even light palpation.  She has not followed up with her pcp regarding this persistent pain.  She has not had xrays of the wrist.  She denies numbness or weakness distal to the injury site.  She has taken hydrocodone, last dose around 10:30 am today which temporarily improves her symptoms     Past Medical History  Diagnosis Date  . Coronary artery disease     a. s/p prior CABG 2005. b. BMS 02/2009 to VG-PDA. c. NSTEMI 08/2010 s/p PTCA/DES to SVG-PDA in previously stented area. d. DES to SVG to RCA on 05/24/14  . Hypertension   . Hyperlipidemia   . GERD (gastroesophageal reflux disease)   . Diverticulitis     4 documented episodes by CT; most recent April 2012  . Depression   . Anxiety   . Hypothyroidism   . MI (myocardial infarction) 2005; 2009; 08/2010 X 2  . COPD (chronic obstructive pulmonary disease)   . Asthma   . Sleep apnea     a. Mild-to-moderate obstructive sleep apnea diagnosed in April 2012 by sleep study.  . Anemia     "related to chole OR"  . History of blood transfusion     "several times; w/chole; pneumonia, diverticulitis"  . Tension headache   . Seizures     "stress related"  . Arthritis   . Chronic back pain    . DDD (degenerative disc disease)   . Pneumonia 04/2011  . Kidney stones   . Acute renal failure ~ 04/2011    "when I had pneumonia"  . Ischemic cardiomyopathy     a. EF 40% in 2012 by cath. b. improved to 50-55% by cath 05/2014   . Carotid artery disease     a. Duplex 50-69% BICA 03/2014.  Marland Kitchen Sinus bradycardia    Past Surgical History  Procedure Laterality Date  . Coronary artery bypass graft  2005    LIMA to AL, SVG to LAD, SVG to Cx, SVG-PDA  . Carotid endarterectomy  2005  . Knee arthroscopy w/ acl reconstruction Left 2001  . Carpal tunnel with cubital tunnel Right 2001  . Coronary angioplasty with stent placement  2011    Promus Premier DES, 3.5 mm x 12 mm, for 99% proximal stent ISR in the SVG-RPDA   . Cardiac catheterization  Jan 2012    patent stents in RCA graft; CAD stable  . Coronary angioplasty with stent placement  05/24/2014  . Appendectomy  ~ 1970  . Laparoscopic cholecystectomy  2010  . Vaginal hysterectomy  ~ 1993  . Tubal ligation  ~ 1989  . Left heart catheterization with coronary/graft angiogram N/A 05/24/2014    Procedure: LEFT HEART CATHETERIZATION WITH CORONARY/GRAFT  Rosalin Hawking;  Surgeon: Marykay Lex, MD;  Location[facility] CATH LAB;  Service: Cardiovascular;  Laterality: N/A;   Family History  Problem Relation Age of Onset  . Heart attack Mother   . Heart disease Mother   . Breast cancer Mother   . Heart attack Father   . Heart disease Father   . Colon cancer Neg Hx    History  Substance Use Topics  . Smoking status: Former Smoker -- 0.50 packs/day for 21 years    Types: Cigarettes    Quit date: 01/03/2014  . Smokeless tobacco: Never Used  . Alcohol Use: No   OB History    No data available     Review of Systems  Constitutional: Negative for fever.  Musculoskeletal: Positive for joint swelling and arthralgias. Negative for myalgias.  Neurological: Negative for weakness and numbness.      Allergies  Adhesive; Crestor; and Lipitor  Home  Medications   Prior to Admission medications   Medication Sig Start Date End Date Taking? Authorizing Provider  ALPRAZolam Prudy Feeler) 1 MG tablet Take 1 mg by mouth 4 (four) times daily as needed for anxiety or sleep.    Yes Historical Provider, MD  amLODipine (NORVASC) 10 MG tablet Take 10 mg by mouth daily.   Yes Historical Provider, MD  aspirin 325 MG tablet Take 162.5 mg by mouth daily.    Yes Historical Provider, MD  benazepril (LOTENSIN) 20 MG tablet TAKE (1) TABLET BY MOUTH EACH MORNING. 08/27/14  Yes Jodelle Gross, NP  furosemide (LASIX) 80 MG tablet Take 80 mg by mouth daily as needed for fluid or edema (swelling).   Yes Historical Provider, MD  HYDROcodone-acetaminophen (NORCO) 10-325 MG per tablet Take 1 tablet by mouth every 4 (four) hours as needed for moderate pain (pain).    Yes Historical Provider, MD  levothyroxine (SYNTHROID, LEVOTHROID) 75 MCG tablet Take 75 mcg by mouth daily before breakfast.    Yes Historical Provider, MD  lovastatin (MEVACOR) 40 MG tablet Take 40 mg by mouth at bedtime.    Yes Historical Provider, MD  Multiple Vitamin (MULTIVITAMIN WITH MINERALS) TABS tablet Take 1 tablet by mouth 2 (two) times daily. 08/28/13  Yes Antoine Poche, MD  omeprazole (PRILOSEC) 20 MG capsule TAKE ONE BY MOUTH CAPSULE DAILY. 06/05/14  Yes Antoine Poche, MD  potassium chloride SA (K-DUR,KLOR-CON) 20 MEQ tablet Take 20 mEq by mouth daily as needed (take with lasix (furosemide)). Take when potassium is low   Yes Historical Provider, MD  prasugrel (EFFIENT) 10 MG TABS tablet Take 1 tablet (10 mg total) by mouth daily. 05/25/14  Yes Rhonda G Barrett, PA-C  TOPROL XL 50 MG 24 hr tablet TAKE (1) TABLET BY MOUTH EACH MORNING WITH OR IMMEDIATELY FOLLOWING AMEAL. 07/25/14  Yes Jodelle Gross, NP  zolpidem (AMBIEN) 5 MG tablet Take 5 mg by mouth at bedtime.   Yes Historical Provider, MD  albuterol (PROAIR HFA) 108 (90 BASE) MCG/ACT inhaler Inhale 2 puffs into the lungs every 6 (six)  hours as needed for wheezing or shortness of breath.    Historical Provider, MD  albuterol (PROVENTIL) (2.5 MG/3ML) 0.083% nebulizer solution Take 2.5 mg by nebulization every 6 (six) hours as needed for wheezing or shortness of breath.    Historical Provider, MD  nitroGLYCERIN (NITROSTAT) 0.4 MG SL tablet Place 0.4 mg under the tongue every 5 (five) minutes as needed for chest pain. If no relief in 15 minutes call 911    Historical  Provider, MD   BP 129/73 mmHg  Pulse 62  Temp(Src) 97.5 F (36.4 C) (Oral)  Resp 16  Ht 5\' 4"  (1.626 m)  Wt 235 lb (106.595 kg)  BMI 40.32 kg/m2  SpO2 100% Physical Exam  Constitutional: She appears well-developed and well-nourished.  HENT:  Head: Atraumatic.  Neck: Normal range of motion.  Cardiovascular:  Pulses equal bilaterally  Musculoskeletal: She exhibits tenderness.       Left wrist: She exhibits decreased range of motion and bony tenderness. She exhibits no swelling, no effusion, no crepitus and no deformity.  Pain along dorsal and volar wrist, radial, worsens with attempts to flex/ext of the wrist.  Increased pain with resistance with flex/ext of thumb and index fingers.  Scaphoid ttp.  No edema.  Distal sensation intact, less than 2 sec distal cap refill.  Neurological: She is alert. She has normal strength. She displays normal reflexes. No sensory deficit.  Skin: Skin is warm and dry.  Psychiatric: She has a normal mood and affect.    ED Course  Procedures (including critical care time) Labs Review Labs Reviewed - No data to display  Imaging Review Dg Wrist Complete Left  11/14/2014   CLINICAL DATA:  Injured left wrist twice in December. Initial encounter.  EXAM: LEFT WRIST - COMPLETE 3+ VIEW  COMPARISON:  None.  FINDINGS: There is no evidence of fracture or dislocation. No acute soft tissue findings.  Mild degenerative spurring at the [facility] joint.  IMPRESSION: No acute osseous findings.   Electronically Signed   By: Marnee SpringJonathon  Watts M.D.    On: 11/14/2014 17:10     EKG Interpretation None      MDM   Final diagnoses:  Wrist pain, chronic, left  Primary osteoarthritis of left wrist    Patients labs and/or radiological studies were viewed and considered during the medical decision making and disposition process. Degenerative changes in the mcp joint, no obvious trauma/fracture/dislocation.  No edema, but cannot rule out ligament or tendon injury given ongoing pain despite injury occuring nearly 2 months ago.  She was referred to ortho for further eval/management.    Burgess AmorJulie Amarra Sawyer, PA-C 11/14/14 1744  Juliet RudeNathan R. Rubin PayorPickering, MD 11/15/14 [id number]

## 2014-11-14 NOTE — ED Notes (Signed)
PA at bedside.

## 2014-11-15 ENCOUNTER — Other Ambulatory Visit: Payer: Self-pay | Admitting: Adult Health

## 2014-11-15 MED ORDER — AMLODIPINE BESYLATE 10 MG PO TABS: 10.0000 mg | ORAL_TABLET | Freq: Every day | ORAL | Status: DC

## 2014-11-15 NOTE — Telephone Encounter (Signed)
[facility]

## 2014-11-23 ENCOUNTER — Other Ambulatory Visit: Payer: Self-pay | Admitting: Cardiology

## 2014-11-23 ENCOUNTER — Telehealth: Payer: Self-pay | Admitting: Orthopedic Surgery

## 2014-11-23 NOTE — Telephone Encounter (Signed)
Patient called to request appointment [facility], for problem of left wrist and thumb pain; offered appointment, upon receipt of referral from primary care, Dr Juanetta GoslingHawkins, per insurance requirement.  Patient Ph# [phone].

## 2014-12-04 ENCOUNTER — Ambulatory Visit: Payer: Self-pay | Admitting: Adult Health

## 2014-12-05 NOTE — Telephone Encounter (Signed)
No further information received; appointment pending as noted - if patient wishes to schedule.

## 2014-12-10 ENCOUNTER — Encounter: Payer: Self-pay | Admitting: Adult Health

## 2014-12-10 ENCOUNTER — Ambulatory Visit (INDEPENDENT_AMBULATORY_CARE_PROVIDER_SITE_OTHER): Payer: Medicaid Other | Admitting: Adult Health

## 2014-12-10 VITALS — BP 114/68 | HR 71 | Ht 63.5 in | Wt 244.8 lb

## 2014-12-10 DIAGNOSIS — I10 Essential (primary) hypertension: Secondary | ICD-10-CM

## 2014-12-10 DIAGNOSIS — I2510 Atherosclerotic heart disease of native coronary artery without angina pectoris: Secondary | ICD-10-CM

## 2014-12-10 NOTE — Progress Notes (Deleted)
Name: [name]    DOB: Oct 24, 1955  Age: 59 y.o.  MR#: [id number]       PCP:  Fredirick MaudlinHAWKINS,EDWARD L, MD      Insurance: Payor: MEDICAID [place] / Plan: MEDICAID [place] ACCESS / Product Type: *No Product type* /   CC:    Chief Complaint  Patient presents with  . Coronary Artery Disease    CABG  . Hypertension    VS Filed Vitals:   12/10/14 1352  BP: 114/68  Pulse: 71  Height: 5' 3.5" (1.613 m)  Weight: 244 lb 12.8 oz (111.041 kg)  SpO2: 95%    Weights Current Weight  12/10/14 244 lb 12.8 oz (111.041 kg)  11/14/14 235 lb (106.595 kg)  06/14/14 237 lb (107.502 kg)    Blood Pressure  BP Readings from Last 3 Encounters:  12/10/14 114/68  11/14/14 126/66  06/14/14 112/70     Admit date:  (Not on file) Last encounter with RMR:  11/15/2014   Allergy Adhesive; Crestor; and Lipitor  Current Outpatient Prescriptions  Medication Sig Dispense Refill  . albuterol (PROAIR HFA) 108 (90 BASE) MCG/ACT inhaler Inhale 2 puffs into the lungs every 6 (six) hours as needed for wheezing or shortness of breath.    Marland Kitchen. albuterol (PROVENTIL) (2.5 MG/3ML) 0.083% nebulizer solution Take 2.5 mg by nebulization every 6 (six) hours as needed for wheezing or shortness of breath.    . ALPRAZolam (XANAX) 1 MG tablet Take 1 mg by mouth 4 (four) times daily as needed for anxiety or sleep.     Marland Kitchen. amLODipine (NORVASC) 10 MG tablet Take 1 tablet (10 mg total) by mouth daily. 90 tablet 2  . aspirin 325 MG tablet Take 162.5 mg by mouth daily.     . benazepril (LOTENSIN) 20 MG tablet TAKE (1) TABLET BY MOUTH EACH MORNING. 30 tablet 6  . furosemide (LASIX) 80 MG tablet Take 80 mg by mouth daily as needed for fluid or edema (swelling).    Marland Kitchen. HYDROcodone-acetaminophen (NORCO) 10-325 MG per tablet Take 1 tablet by mouth every 4 (four) hours as needed for moderate pain (pain).     Marland Kitchen. levothyroxine (SYNTHROID, LEVOTHROID) 75 MCG tablet Take 75 mcg by mouth daily before breakfast.     . lovastatin (MEVACOR) 40 MG tablet TAKE  (1) TABLET BY MOUTH AT BEDTIME. 30 tablet 6  . Multiple Vitamin (MULTIVITAMIN WITH MINERALS) TABS tablet Take 1 tablet by mouth 2 (two) times daily. 25 tablet 4  . nitroGLYCERIN (NITROSTAT) 0.4 MG SL tablet Place 0.4 mg under the tongue every 5 (five) minutes as needed for chest pain. If no relief in 15 minutes call 911    . omeprazole (PRILOSEC) 20 MG capsule TAKE ONE BY MOUTH CAPSULE DAILY. 90 capsule 3  . potassium chloride SA (K-DUR,KLOR-CON) 20 MEQ tablet Take 20 mEq by mouth daily as needed (take with lasix (furosemide)). Take when potassium is low    . prasugrel (EFFIENT) 10 MG TABS tablet Take 1 tablet (10 mg total) by mouth daily. 30 tablet 11  . TOPROL XL 50 MG 24 hr tablet TAKE (1) TABLET BY MOUTH EACH MORNING WITH OR IMMEDIATELY FOLLOWING AMEAL. 30 tablet 6  . zolpidem (AMBIEN) 5 MG tablet Take 5 mg by mouth at bedtime.    . [DISCONTINUED] carbamazepine (TEGRETOL XR) 200 MG 12 hr tablet Take 200 mg by mouth 2 (two) times daily.      . [DISCONTINUED] lamoTRIgine (LAMICTAL) 100 MG tablet Take 100 mg by mouth 2 (two)  times daily.       No current facility-administered medications for this visit.    Discontinued Meds:   There are no discontinued medications.  Patient Active Problem List   Diagnosis Date Noted  . Chest pain 05/28/2014  . Atherosclerotic heart disease of native coronary artery with angina pectoris 05/23/2014    Class: Diagnosis of  . Coronary artery disease involving autologous vein coronary bypass graft with other forms of angina pectoris 05/23/2014    Class: Chronic  . C. difficile colitis 10/03/2013  . 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 LLQ abdominal pain 12/02/2011  . Epigastric pain 04/19/2011  . Anemia 04/19/2011  . Diverticulitis 01/15/2011  . DIVERTICULITIS OF COLON 07/14/2010  . DIARRHEA, CHRONIC 07/14/2010  . CORONARY ATHEROSCLEROSIS NATIVE CORONARY ARTERY 01/23/2010  . HEADACHE 01/23/2010  . Hyperlipidemia with  target LDL less than 70 05/27/2009  . ANXIETY 05/27/2009  . Essential hypertension 05/27/2009  . CAROTID OCCLUSIVE DISEASE 05/27/2009  . COPD 05/27/2009  . GERD 05/27/2009  . SEIZURE DISORDER 05/27/2009    LABS    Component Value Date/Time   NA 138 05/29/2014 0536   NA 139 05/28/2014 1851   NA 142 05/25/2014 0452   K 4.2 05/29/2014 0536   K 4.6 05/28/2014 1851   K 4.6 05/25/2014 0452   CL 101 05/29/2014 0536   CL 100 05/28/2014 1851   CL 104 05/25/2014 0452   CO2 25 05/29/2014 0536   CO2 25 05/28/2014 1851   CO2 28 05/25/2014 0452   GLUCOSE 99 05/29/2014 0536   GLUCOSE 97 05/28/2014 1851   GLUCOSE 104* 05/25/2014 0452   BUN 14 05/29/2014 0536   BUN 17 05/28/2014 1851   BUN 19 05/25/2014 0452   CREATININE 0.89 05/29/2014 0536   CREATININE 1.19* 05/28/2014 1851   CREATININE 0.90 05/25/2014 0452   CREATININE 1.07 12/02/2011 1140   CREATININE 0.91 04/16/2011 1212   CALCIUM 8.5 05/29/2014 0536   CALCIUM 8.9 05/28/2014 1851   CALCIUM 9.4 05/25/2014 0452   GFRNONAA 70* 05/29/2014 0536   GFRNONAA 49* 05/28/2014 1851   GFRNONAA 69* 05/25/2014 0452   GFRAA 81* 05/29/2014 0536   GFRAA 57* 05/28/2014 1851   GFRAA 80* 05/25/2014 0452   CMP     Component Value Date/Time   NA 138 05/29/2014 0536   K 4.2 05/29/2014 0536   CL 101 05/29/2014 0536   CO2 25 05/29/2014 0536   GLUCOSE 99 05/29/2014 0536   BUN 14 05/29/2014 0536   CREATININE 0.89 05/29/2014 0536   CREATININE 1.07 12/02/2011 1140   CALCIUM 8.5 05/29/2014 0536   PROT 8.5* 04/16/2011 1208   ALBUMIN 4.4 04/16/2011 1208   AST 21 04/16/2011 1208   ALT 22 04/16/2011 1208   ALKPHOS 101 04/16/2011 1208   BILITOT 0.2* 04/16/2011 1208   GFRNONAA 70* 05/29/2014 0536   GFRAA 81* 05/29/2014 0536       Component Value Date/Time   WBC 5.4 05/29/2014 0536   WBC 7.2 05/28/2014 1851   WBC 6.1 05/25/2014 0452   HGB 10.3* 05/29/2014 0536   HGB 11.4* 05/28/2014 1851   HGB 11.3* 05/25/2014 0452   HCT 30.4* 05/29/2014  0536   HCT 33.4* 05/28/2014 1851   HCT 33.9* 05/25/2014 0452   MCV 90.2 05/29/2014 0536   MCV 89.3 05/28/2014 1851   MCV 91.1 05/25/2014 0452    Lipid Panel     Component Value Date/Time   CHOL 196 05/29/2014 0536   TRIG 343*  05/29/2014 0536   HDL 34* 05/29/2014 0536   CHOLHDL 5.8 05/29/2014 0536   VLDL 69* 05/29/2014 0536   LDLCALC 93 05/29/2014 0536    ABG    Component Value Date/Time   PHART  01/19/2011 1110    7.400 CORRECTED ON 04/16 AT 1535: PREVIOUSLY REPORTED AS 7.424   PCO2ART  01/19/2011 1110    38.3 CORRECTED ON 04/16 AT 1535: PREVIOUSLY REPORTED AS 39.0   PO2ART * 01/19/2011 1110    75.2 CORRECTED ON 04/16 AT 1535: PREVIOUSLY REPORTED AS 80.4   HCO3  01/19/2011 1110    23.2 CORRECTED ON 04/16 AT 1535: PREVIOUSLY REPORTED AS 25.1   TCO2  01/19/2011 1110    21.1 CORRECTED ON 04/16 AT 1535: PREVIOUSLY REPORTED AS 22.6   ACIDBASEDEF 0.9 01/19/2011 1110   O2SAT  01/19/2011 1110    95.6 CORRECTED ON 04/16 AT 1535: PREVIOUSLY REPORTED AS 96.2     Lab Results  Component Value Date   TSH 6.835* 10/01/2013   BNP (last 3 results) No results for input(s): BNP in the last 8760 hours.  ProBNP (last 3 results)  Recent Labs  05/28/14 1851  PROBNP 191.9*    Cardiac Panel (last 3 results) No results for input(s): CKTOTAL, CKMB, TROPONINI, RELINDX in the last 72 hours.  Iron/TIBC/Ferritin/ %Sat No results found for: IRON, TIBC, FERRITIN, IRONPCTSAT   EKG Orders placed or performed during the hospital encounter of 05/28/14  . ED EKG (<47mins upon arrival to the ED)  . ED EKG (<16mins upon arrival to the ED)  . EKG 12-Lead  . EKG 12-Lead  . EKG 12-Lead  . EKG     Prior Assessment and Plan Problem List as of 12/10/2014      Cardiovascular and Mediastinum   Essential hypertension   Last Assessment & Plan 06/14/2014 Office Visit Written 06/14/2014  2:15 PM by Jodelle Gross, NP    Blood pressure is well controlled. No changes at this time in  medications.       CORONARY ATHEROSCLEROSIS NATIVE CORONARY ARTERY   Last Assessment & Plan 06/14/2014 Office Visit Written 06/14/2014  2:14 PM by Jodelle Gross, NP    She continues on DAPT and is tolerating all cardiac medications. She has stopped smoking. Stress level is down significantly. She is becoming more active. She is re-referred to cardiac rehab to be established with them and begin program. Will see her again in 6 months unless symptomatic.      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 Continue aggressive secondary prevention. We'll arrange for carotid Doppler since she is overdue.        Respiratory   COPD   Last Assessment & Plan 06/14/2014 Office Visit Written 06/14/2014  2:16 PM by Jodelle Gross, NP    She has quit smoking. She is congratulated on this and encouraged to continue this lifestyle change.        Digestive   GERD   DIVERTICULITIS OF COLON   C. difficile colitis     Other   Hyperlipidemia with target LDL less than 70   ANXIETY   Last Assessment & Plan 01/07/2011 Office Visit  Written 01/07/2011  4:44 PM by Jodelle Gross, NP    She is to seek medical attention for her multiple somatic complaints. I have given her a list of names of physicians which accept medicaid.  No further medications are provided.      SEIZURE DISORDER   HEADACHE   DIARRHEA, CHRONIC   Diverticulitis   Last Assessment & Plan 04/16/2011 Office Visit Edited 04/19/2011  1:08 AM by Nira Retort, NP    59 year old Caucasian female with 4 documented episodes of diverticulitis, most recently in April 2012. No prior hx of colonoscopy.  Diverticulitis has resolved since last episode, s/p abx. Needs colonoscopy for lower GI tract evaluation; likely will be referred for elective surgical intervention due to continued recurrence.  Proceed with TCS with Dr. Darrick Penna in near future: the risks, benefits, and alternatives have been discussed with the patient in detail. The patient states understanding and desires to proceed. We will utilize the assistance of anesthesia, under propofol, due to pt's hx of drug abuse in remote past and polypharmacy.        Epigastric pain   Last Assessment & Plan 04/16/2011 Office Visit Edited 04/19/2011  1:14 AM by Nira Retort, NP    Several week hx of epigastric and RUQ pain, without precipitating or relieving factors, associated with N/V. Recently underwent cardiac eval in June 2012 due to similar symptoms. Negative work-up. Pt has lost approximately 12 lbs since last visit in April. Hx of cholecystectomy. Per pt's report, under significant stress since pt left husband. Differentials include gastritis, PUD, hepatobiliary/pancreatic etiology less likely; question of stressors causing exacerbation of symptoms. Will evaluated with labs and proceed with upper endoscopy.  [place] abd today HFP, lipase, BMP, CBC today EGD with Dr. Darrick Penna along with TCS. The R/B/A have been discussed in detail with pt, and she states understanding. We will utilize the assistance of anesthesia for sedation due to pt's hx of drug use in the past as well as polypharmacy.       Anemia   Last Assessment & Plan 04/16/2011 Office Visit Written 04/19/2011  1:21 AM by Nira Retort, NP    Evaluated in 2007 by Dr. Darrick Penna for anemia while inpatient at [facility]; was recommended to return outpatient for EGD/colonoscopy. Pt never completed. Most recent Hgb from June 10.4. Appears normocytic anemia, question early iron deficiency anemia. EGD and colonoscopy to be completed in near future. Do not have hemoccult status. Will obtain ifobt and anemia panel in the  interim prior to procedures.  ifobt Anemia panel EGD/TCS in near future      LLQ abdominal pain   Last Assessment & Plan 12/02/2011 Office Visit Edited 12/02/2011 12:04 PM by [place] Bali, MD    DIVERTICULITIS V. IBS-MIXED PATTERN. PT FAILED TO SHOW FOR LAST 2 APPT.  CT ORDERED FOR TODAY. PT ELECTED TO WAIT UNTIL TOMORROW. TCS IN 2-3 WEEKS WITH PROPOFOL DUE TO POLYPHARMACY. FOLLOW UP IN 6 MOS. REFER TO DR. Lovell Sheehan FOR L HEMICOLECTOMY      Chest pain       Imaging: Dg Wrist Complete Left  11/14/2014   CLINICAL DATA:  Injured left wrist twice in December. Initial encounter.  EXAM: LEFT WRIST - COMPLETE 3+ VIEW  COMPARISON:  None.  FINDINGS: There is no evidence of fracture or dislocation. No acute soft tissue findings.  Mild degenerative spurring at the first [facility] joint.  IMPRESSION: No acute osseous findings.   Electronically Signed   By: Kathrynn Ducking.D.  On: 11/14/2014 17:10

## 2014-12-10 NOTE — Patient Instructions (Signed)
Your physician wants you to follow-up in: 6 months with Toni ReiningKathryn Lawrence, NP. You will receive a reminder letter in the mail two months in advance. If you don't receive a letter, please call our office to schedule the follow-up appointment.  Please continue to exercise and notify our office of any blood pressure changes.   Thank you for choosing [facility]!

## 2014-12-10 NOTE — Progress Notes (Signed)
Cardiology Office Note   Date:  12/10/2014   ID:  Toni Beaversatricia C Friday, DOB 08-14-1956, MRN [id number]  PCP:  Fredirick MaudlinHAWKINS,EDWARD L, MD  Cardiologist:  Arlington CalixBranch/ Rae Sutcliffe, NP   Chief Complaint  Patient presents with  . Coronary Artery Disease    CABG  . Hypertension      History of Present Illness: [name] is a 59 y.o. female who presents for ongoing assessment and management of CAD, history of CABG, multiple areas of stent placement with most recent drug-eluting stent to the SVG to RCA on August of 2015, ischemic cardiomyopathy, with an EF of 50-55% per cath in August of 2015, hypertension, hyperlipidemia, and multiple medical problems.  She was last seen in the office in September of 2015.  She has multiple somatic complaints, but had no complaints of chest pain.  She is here for six-month followup  She today, feeling very well, he is anxious to get back on an exercise program with walking.  She has had some issues with her left knee and some issues with her arches being sore.  She has changed her shoes and has gotten herself.  The knee brace to allow her to walk.  She denies any chest pain.  She is ready to get back outside and begin her exercise that was waylaid do to whether and physical ailments.  She is very motivated to do so.  Past Medical History  Diagnosis Date  . Coronary artery disease     a. s/p prior CABG 2005. b. BMS 02/2009 to VG-PDA. c. NSTEMI 08/2010 s/p PTCA/DES to SVG-PDA in previously stented area. d. DES to SVG to RCA on 05/24/14  . Hypertension   . Hyperlipidemia   . GERD (gastroesophageal reflux disease)   . Diverticulitis     4 documented episodes by CT; most recent April 2012  . Depression   . Anxiety   . Hypothyroidism   . MI (myocardial infarction) 2005; 2009; 08/2010 X 2  . COPD (chronic obstructive pulmonary disease)   . Asthma   . Sleep apnea     a. Mild-to-moderate obstructive sleep apnea diagnosed in April 2012 by sleep study.  . Anemia    "related to chole OR"  . History of blood transfusion     "several times; w/chole; pneumonia, diverticulitis"  . Tension headache   . Seizures     "stress related"  . Arthritis   . Chronic back pain   . DDD (degenerative disc disease)   . Pneumonia 04/2011  . Kidney stones   . Acute renal failure ~ 04/2011    "when I had pneumonia"  . Ischemic cardiomyopathy     a. EF 40% in 2012 by cath. b. improved to 50-55% by cath 05/2014   . Carotid artery disease     a. Duplex 50-69% BICA 03/2014.  Marland Kitchen. Sinus bradycardia     Past Surgical History  Procedure Laterality Date  . Coronary artery bypass graft  2005    LIMA to AL, SVG to LAD, SVG to Cx, SVG-PDA  . Carotid endarterectomy  2005  . Knee arthroscopy w/ acl reconstruction Left 2001  . Carpal tunnel with cubital tunnel Right 2001  . Coronary angioplasty with stent placement  2011    Promus Premier DES, 3.5 mm x 12 mm, for 99% proximal stent ISR in the SVG-RPDA   . Cardiac catheterization  Jan 2012    patent stents in RCA graft; CAD stable  . Coronary angioplasty with stent placement  05/24/2014  . Appendectomy  ~ 1970  . Laparoscopic cholecystectomy  2010  . Vaginal hysterectomy  ~ 1993  . Tubal ligation  ~ 1989  . Left heart catheterization with coronary/graft angiogram N/A 05/24/2014    Procedure: LEFT HEART CATHETERIZATION WITH Isabel Caprice;  Surgeon: Marykay Lex, MD;  Location: [facility] CATH LAB;  Service: Cardiovascular;  Laterality: N/A;     Current Outpatient Prescriptions  Medication Sig Dispense Refill  . albuterol (PROAIR HFA) 108 (90 BASE) MCG/ACT inhaler Inhale 2 puffs into the lungs every 6 (six) hours as needed for wheezing or shortness of breath.    Marland Kitchen albuterol (PROVENTIL) (2.5 MG/3ML) 0.083% nebulizer solution Take 2.5 mg by nebulization every 6 (six) hours as needed for wheezing or shortness of breath.    . ALPRAZolam (XANAX) 1 MG tablet Take 1 mg by mouth 4 (four) times daily as needed for anxiety or sleep.      Marland Kitchen amLODipine (NORVASC) 10 MG tablet Take 1 tablet (10 mg total) by mouth daily. 90 tablet 2  . aspirin 325 MG tablet Take 162.5 mg by mouth daily.     . benazepril (LOTENSIN) 20 MG tablet TAKE (1) TABLET BY MOUTH EACH MORNING. 30 tablet 6  . furosemide (LASIX) 80 MG tablet Take 80 mg by mouth daily as needed for fluid or edema (swelling).    Marland Kitchen HYDROcodone-acetaminophen (NORCO) 10-325 MG per tablet Take 1 tablet by mouth every 4 (four) hours as needed for moderate pain (pain).     Marland Kitchen levothyroxine (SYNTHROID, LEVOTHROID) 75 MCG tablet Take 75 mcg by mouth daily before breakfast.     . lovastatin (MEVACOR) 40 MG tablet TAKE (1) TABLET BY MOUTH AT BEDTIME. 30 tablet 6  . Multiple Vitamin (MULTIVITAMIN WITH MINERALS) TABS tablet Take 1 tablet by mouth 2 (two) times daily. 25 tablet 4  . nitroGLYCERIN (NITROSTAT) 0.4 MG SL tablet Place 0.4 mg under the tongue every 5 (five) minutes as needed for chest pain. If no relief in 15 minutes call 911    . omeprazole (PRILOSEC) 20 MG capsule TAKE ONE BY MOUTH CAPSULE DAILY. 90 capsule 3  . potassium chloride SA (K-DUR,KLOR-CON) 20 MEQ tablet Take 20 mEq by mouth daily as needed (take with lasix (furosemide)). Take when potassium is low    . prasugrel (EFFIENT) 10 MG TABS tablet Take 1 tablet (10 mg total) by mouth daily. 30 tablet 11  . TOPROL XL 50 MG 24 hr tablet TAKE (1) TABLET BY MOUTH EACH MORNING WITH OR IMMEDIATELY FOLLOWING AMEAL. 30 tablet 6  . zolpidem (AMBIEN) 5 MG tablet Take 5 mg by mouth at bedtime.    . [DISCONTINUED] carbamazepine (TEGRETOL XR) 200 MG 12 hr tablet Take 200 mg by mouth 2 (two) times daily.      . [DISCONTINUED] lamoTRIgine (LAMICTAL) 100 MG tablet Take 100 mg by mouth 2 (two) times daily.       No current facility-administered medications for this visit.    Allergies:   Adhesive; Crestor; and Lipitor    Social History:  The patient  reports that she quit smoking about 11 months ago. Her smoking use included Cigarettes. She  has a 10.5 pack-year smoking history. She has never used smokeless tobacco. She reports that she uses illicit drugs (Cocaine and Marijuana). She reports that she does not drink alcohol.   Family History:  The patient's family history includes Breast cancer in her mother; Heart attack in her father and mother; Heart disease in her father and  mother. There is no history of Colon cancer.    ROS: .   All other systems are reviewed and negative.Unless otherwise mentioned in H&P above.   PHYSICAL EXAM: VS:  There were no vitals taken for this visit. , BMI There is no weight on file to calculate BMI. GEN: Well nourished, well developed, in no acute distress HEENT: normal Neck: no JVD, carotid bruits, or masses Cardiac: RRR; no murmurs, rubs, or gallops,no edema  Respiratory:  clear to auscultation bilaterally, normal work of breathing GI: soft, nontender, nondistended, + BS MS: no deformity or atrophy Skin: warm and dry, no rash Neuro:  Strength and sensation are intact Psych: euthymic mood, full affect   Recent Labs: 05/28/2014: Pro B Natriuretic peptide (BNP) 191.9* 05/29/2014: BUN 14; Creatinine 0.89; Hemoglobin 10.3*; Platelets 214; Potassium 4.2; Sodium 138    Lipid Panel    Component Value Date/Time   CHOL 196 05/29/2014 0536   TRIG 343* 05/29/2014 0536   HDL 34* 05/29/2014 0536   CHOLHDL 5.8 05/29/2014 0536   VLDL 69* 05/29/2014 0536   LDLCALC 93 05/29/2014 0536      Wt Readings from Last 3 Encounters:  11/14/14 235 lb (106.595 kg)  06/14/14 237 lb (107.502 kg)  05/29/14 239 lb 10.2 oz (108.7 kg)     ASSESSMENT AND PLAN:  1. CAD: She is without cardiac complaints.  She denies chest pain, dyspnea on exertion, or significant fatigue.I have encouraged her and her desire to return to an exercise routine of walking.  She is beginning slow and will slowly increase the time and distance.  She has not had any discomfort with exertion.  She will continue her current medication  regimen, and risk modification.  2. Hypertension: Blood pressure is low normal.  Advisors begins to lose weight and exercise we may have to dialed down.  The doses of antihypertensives.  She is on 4 separate medications.  I have advised to take her blood pressure at home, and if he begins to be lower as she is losing weight, we will need to see her and to adjust medications.  Currently, I will leave her alone, and follow her closely.  Current medicines are reviewed at length with the patient today.     Disposition:   FU with 3-6 months.   Signed, Joni Reining, NP  12/10/2014 7:07 AM    [facility] [address] [place], [place] [address] Phone: [phone]; Fax: [phone]

## 2015-01-09 ENCOUNTER — Other Ambulatory Visit: Payer: Self-pay | Admitting: Cardiology

## 2015-01-11 ENCOUNTER — Other Ambulatory Visit (HOSPITAL_COMMUNITY): Payer: Self-pay | Admitting: Radiology

## 2015-01-11 DIAGNOSIS — G4730 Sleep apnea, unspecified: Secondary | ICD-10-CM

## 2015-02-05 ENCOUNTER — Emergency Department (HOSPITAL_COMMUNITY)
Admission: EM | Admit: 2015-02-05 | Discharge: 2015-02-05 | Disposition: A | Discharge status: Home / Self Care | Payer: Medicaid Other | Attending: Emergency Medicine | Admitting: Emergency Medicine

## 2015-02-05 ENCOUNTER — Emergency Department (HOSPITAL_COMMUNITY): Payer: Medicaid Other

## 2015-02-05 ENCOUNTER — Ambulatory Visit: Payer: Medicaid Other | Admitting: Orthopedic Surgery

## 2015-02-05 ENCOUNTER — Encounter (HOSPITAL_COMMUNITY): Payer: Self-pay

## 2015-02-05 DIAGNOSIS — F419 Anxiety disorder, unspecified: Secondary | ICD-10-CM | POA: Insufficient documentation

## 2015-02-05 DIAGNOSIS — Z90710 Acquired absence of both cervix and uterus: Secondary | ICD-10-CM | POA: Diagnosis not present

## 2015-02-05 DIAGNOSIS — Z87448 Personal history of other diseases of urinary system: Secondary | ICD-10-CM | POA: Insufficient documentation

## 2015-02-05 DIAGNOSIS — Z8701 Personal history of pneumonia (recurrent): Secondary | ICD-10-CM | POA: Insufficient documentation

## 2015-02-05 DIAGNOSIS — K219 Gastro-esophageal reflux disease without esophagitis: Secondary | ICD-10-CM | POA: Diagnosis not present

## 2015-02-05 DIAGNOSIS — G8929 Other chronic pain: Secondary | ICD-10-CM | POA: Diagnosis not present

## 2015-02-05 DIAGNOSIS — Z7982 Long term (current) use of aspirin: Secondary | ICD-10-CM | POA: Diagnosis not present

## 2015-02-05 DIAGNOSIS — Z951 Presence of aortocoronary bypass graft: Secondary | ICD-10-CM | POA: Insufficient documentation

## 2015-02-05 DIAGNOSIS — J449 Chronic obstructive pulmonary disease, unspecified: Secondary | ICD-10-CM | POA: Diagnosis not present

## 2015-02-05 DIAGNOSIS — Z9851 Tubal ligation status: Secondary | ICD-10-CM | POA: Insufficient documentation

## 2015-02-05 DIAGNOSIS — I10 Essential (primary) hypertension: Secondary | ICD-10-CM | POA: Diagnosis not present

## 2015-02-05 DIAGNOSIS — Z79899 Other long term (current) drug therapy: Secondary | ICD-10-CM | POA: Diagnosis not present

## 2015-02-05 DIAGNOSIS — Z9861 Coronary angioplasty status: Secondary | ICD-10-CM | POA: Insufficient documentation

## 2015-02-05 DIAGNOSIS — Z9889 Other specified postprocedural states: Secondary | ICD-10-CM | POA: Insufficient documentation

## 2015-02-05 DIAGNOSIS — Z87442 Personal history of urinary calculi: Secondary | ICD-10-CM | POA: Insufficient documentation

## 2015-02-05 DIAGNOSIS — I252 Old myocardial infarction: Secondary | ICD-10-CM | POA: Insufficient documentation

## 2015-02-05 DIAGNOSIS — I2510 Atherosclerotic heart disease of native coronary artery without angina pectoris: Secondary | ICD-10-CM | POA: Diagnosis not present

## 2015-02-05 DIAGNOSIS — M1990 Unspecified osteoarthritis, unspecified site: Secondary | ICD-10-CM | POA: Insufficient documentation

## 2015-02-05 DIAGNOSIS — R1032 Left lower quadrant pain: Secondary | ICD-10-CM | POA: Diagnosis present

## 2015-02-05 DIAGNOSIS — Z862 Personal history of diseases of the blood and blood-forming organs and certain disorders involving the immune mechanism: Secondary | ICD-10-CM | POA: Diagnosis not present

## 2015-02-05 DIAGNOSIS — Z9049 Acquired absence of other specified parts of digestive tract: Secondary | ICD-10-CM | POA: Diagnosis not present

## 2015-02-05 DIAGNOSIS — F329 Major depressive disorder, single episode, unspecified: Secondary | ICD-10-CM | POA: Insufficient documentation

## 2015-02-05 DIAGNOSIS — E039 Hypothyroidism, unspecified: Secondary | ICD-10-CM | POA: Diagnosis not present

## 2015-02-05 DIAGNOSIS — Z87891 Personal history of nicotine dependence: Secondary | ICD-10-CM | POA: Diagnosis not present

## 2015-02-05 DIAGNOSIS — Z8673 Personal history of transient ischemic attack (TIA), and cerebral infarction without residual deficits: Secondary | ICD-10-CM | POA: Insufficient documentation

## 2015-02-05 LAB — URINALYSIS, ROUTINE W REFLEX MICROSCOPIC
Bilirubin Urine: NEGATIVE
Glucose, UA: NEGATIVE mg/dL
Hgb urine dipstick: NEGATIVE
Leukocytes, UA: NEGATIVE
Nitrite: NEGATIVE
Protein, ur: 30 mg/dL — AB
Specific Gravity, Urine: >1.03 — ABNORMAL HIGH (ref 1.005–1.030)
Urobilinogen, UA: 0.2 mg/dL (ref 0.0–1.0)
pH: 5 (ref 5.0–8.0)

## 2015-02-05 LAB — CBC WITH DIFFERENTIAL/PLATELET
Basophils Absolute: 0 10*3/uL (ref 0.0–0.1)
Basophils Relative: 1 % (ref 0–1)
Eosinophils Absolute: 0.1 10*3/uL (ref 0.0–0.7)
Eosinophils Relative: 1 % (ref 0–5)
HCT: 37 % (ref 36.0–46.0)
Hemoglobin: 12.3 g/dL (ref 12.0–15.0)
Lymphocytes Relative: 33 % (ref 12–46)
Lymphs Abs: 2.3 10*3/uL (ref 0.7–4.0)
MCH: 31.1 pg (ref 26.0–34.0)
MCHC: 33.2 g/dL (ref 30.0–36.0)
MCV: 93.7 fL (ref 78.0–100.0)
Monocytes Absolute: 0.4 10*3/uL (ref 0.1–1.0)
Monocytes Relative: 6 % (ref 3–12)
Neutro Abs: 4.1 10*3/uL (ref 1.7–7.7)
Neutrophils Relative %: 59 % (ref 43–77)
Platelets: 309 10*3/uL (ref 150–400)
RBC: 3.95 MIL/uL (ref 3.87–5.11)
RDW: 13.1 % (ref 11.5–15.5)
WBC: 6.9 10*3/uL (ref 4.0–10.5)

## 2015-02-05 LAB — BASIC METABOLIC PANEL
Anion gap: 8 (ref 5–15)
BUN: 13 mg/dL (ref 6–20)
CO2: 25 mmol/L (ref 22–32)
Calcium: 9.1 mg/dL (ref 8.9–10.3)
Chloride: 106 mmol/L (ref 101–111)
Creatinine, Ser: 0.8 mg/dL (ref 0.44–1.00)
GFR calc Af Amer: >60 mL/min (ref >60)
GFR calc non Af Amer: >60 mL/min (ref >60)
Glucose, Bld: 146 mg/dL — ABNORMAL HIGH (ref 70–99)
Potassium: 3.9 mmol/L (ref 3.5–5.1)
Sodium: 139 mmol/L (ref 135–145)

## 2015-02-05 LAB — URINE MICROSCOPIC-ADD ON

## 2015-02-05 MED ORDER — HYDROMORPHONE HCL 1 MG/ML IJ SOLN
1.0000 mg | Freq: Once | INTRAMUSCULAR | Status: AC
  Administered 2015-02-05: 1 mg via INTRAVENOUS

## 2015-02-05 MED ORDER — KETOROLAC TROMETHAMINE 30 MG/ML IJ SOLN
15.0000 mg | Freq: Once | INTRAMUSCULAR | Status: AC
  Administered 2015-02-05: 15 mg via INTRAVENOUS
  Filled 2015-02-05: qty 1

## 2015-02-05 MED ORDER — HYDROMORPHONE HCL 1 MG/ML IJ SOLN
1.0000 mg | Freq: Once | INTRAMUSCULAR | Status: AC
  Administered 2015-02-05: 1 mg via INTRAVENOUS
  Filled 2015-02-05: qty 1

## 2015-02-05 MED ORDER — METHOCARBAMOL 500 MG PO TABS: 500.0000 mg | ORAL_TABLET | Freq: Two times a day (BID) | ORAL | Status: DC | PRN

## 2015-02-05 MED ORDER — ONDANSETRON HCL 4 MG/2ML IJ SOLN: 4.0000 mg | Freq: Once | INTRAMUSCULAR | Status: DC

## 2015-02-05 MED ORDER — ONDANSETRON HCL 4 MG/2ML IJ SOLN
4.0000 mg | Freq: Once | INTRAMUSCULAR | Status: AC
  Administered 2015-02-05: 4 mg via INTRAVENOUS
  Filled 2015-02-05: qty 2

## 2015-02-05 MED ORDER — SODIUM CHLORIDE 0.9 % IV BOLUS (SEPSIS)
1000.0000 mL | Freq: Once | INTRAVENOUS | Status: AC
  Administered 2015-02-05: 1000 mL via INTRAVENOUS

## 2015-02-05 MED ORDER — IOHEXOL 300 MG/ML  SOLN
50.0000 mL | Freq: Once | INTRAMUSCULAR | Status: AC | PRN
  Administered 2015-02-05: 50 mL via ORAL

## 2015-02-05 MED ORDER — HYDROMORPHONE HCL 1 MG/ML IJ SOLN
INTRAMUSCULAR | Status: AC
  Filled 2015-02-05: qty 1

## 2015-02-05 MED ORDER — IOHEXOL 300 MG/ML  SOLN
100.0000 mL | Freq: Once | INTRAMUSCULAR | Status: AC | PRN
  Administered 2015-02-05: 100 mL via INTRAVENOUS

## 2015-02-05 NOTE — ED Provider Notes (Signed)
CSN: [id number]     Arrival date & time 02/05/15  1238 History   First MD Initiated Contact with Patient 02/05/15 1451     Chief Complaint  Patient presents with  . Abdominal Pain     (Consider location/radiation/quality/duration/timing/severity/associated sxs/prior Treatment) HPI   59 year old female with abdominal pain. Gradual onset last night. Pain is in the left lower quadrant. Patient initially felt that she may be constipated. She subsequently had a bowel movement with no appreciable change in her symptoms. Pain is progressively worsened since around 5:00 this morning.  Past Medical History  Diagnosis Date  . Coronary artery disease     a. s/p prior CABG 2005. b. BMS 02/2009 to VG-PDA. c. NSTEMI 08/2010 s/p PTCA/DES to SVG-PDA in previously stented area. d. DES to SVG to RCA on 05/24/14  . Hypertension   . Hyperlipidemia   . GERD (gastroesophageal reflux disease)   . Diverticulitis     4 documented episodes by CT; most recent April 2012  . Depression   . Anxiety   . Hypothyroidism   . MI (myocardial infarction) 2005; 2009; 08/2010 X 2  . COPD (chronic obstructive pulmonary disease)   . Asthma   . Sleep apnea     a. Mild-to-moderate obstructive sleep apnea diagnosed in April 2012 by sleep study.  . Anemia     "related to chole OR"  . History of blood transfusion     "several times; w/chole; pneumonia, diverticulitis"  . Tension headache   . Seizures     "stress related"  . Arthritis   . Chronic back pain   . DDD (degenerative disc disease)   . Pneumonia 04/2011  . Kidney stones   . Acute renal failure ~ 04/2011    "when I had pneumonia"  . Ischemic cardiomyopathy     a. EF 40% in 2012 by cath. b. improved to 50-55% by cath 05/2014   . Carotid artery disease     a. Duplex 50-69% BICA 03/2014.  Marland Kitchen Sinus bradycardia    Past Surgical History  Procedure Laterality Date  . Coronary artery bypass graft  2005    LIMA to AL, SVG to LAD, SVG to Cx, SVG-PDA  . Carotid  endarterectomy  2005  . Knee arthroscopy w/ acl reconstruction Left 2001  . Carpal tunnel with cubital tunnel Right 2001  . Coronary angioplasty with stent placement  2011    Promus Premier DES, 3.5 mm x 12 mm, for 99% proximal stent ISR in the SVG-RPDA   . Cardiac catheterization  Jan 2012    patent stents in RCA graft; CAD stable  . Coronary angioplasty with stent placement  05/24/2014  . Appendectomy  ~ 1970  . Laparoscopic cholecystectomy  2010  . Vaginal hysterectomy  ~ 1993  . Tubal ligation  ~ 1989  . Left heart catheterization with coronary/graft angiogram N/A 05/24/2014    Procedure: LEFT HEART CATHETERIZATION WITH Isabel Caprice;  Surgeon: Marykay Lex, MD;  Location: [facility] CATH LAB;  Service: Cardiovascular;  Laterality: N/A;   Family History  Problem Relation Age of Onset  . Heart attack Mother   . Heart disease Mother   . Breast cancer Mother   . Heart attack Father   . Heart disease Father   . Colon cancer Neg Hx    History  Substance Use Topics  . Smoking status: Former Smoker -- 0.50 packs/day for 21 years    Types: Cigarettes    Quit date: 01/03/2014  . Smokeless  tobacco: Never Used  . Alcohol Use: No   OB History    No data available     Review of Systems  All systems reviewed and negative, other than as noted in HPI.   Allergies  Adhesive; Crestor; and Lipitor  Home Medications   Prior to Admission medications   Medication Sig Start Date End Date Taking? Authorizing Provider  albuterol (PROAIR HFA) 108 (90 BASE) MCG/ACT inhaler Inhale 2 puffs into the lungs every 6 (six) hours as needed for wheezing or shortness of breath.   Yes Historical Provider, MD  albuterol (PROVENTIL) (2.5 MG/3ML) 0.083% nebulizer solution Take 2.5 mg by nebulization every 6 (six) hours as needed for wheezing or shortness of breath.   Yes Historical Provider, MD  ALPRAZolam Prudy Feeler) 1 MG tablet Take 1 mg by mouth 4 (four) times daily as needed for anxiety or sleep.     Yes Historical Provider, MD  amLODipine (NORVASC) 10 MG tablet Take 1 tablet (10 mg total) by mouth daily. 11/15/14  Yes Jodelle Gross, NP  aspirin 325 MG tablet Take 162.5 mg by mouth daily.    Yes Historical Provider, MD  benazepril (LOTENSIN) 20 MG tablet TAKE (1) TABLET BY MOUTH EACH MORNING. 08/27/14  Yes Jodelle Gross, NP  furosemide (LASIX) 80 MG tablet Take 80 mg by mouth daily as needed for fluid or edema (swelling).   Yes Historical Provider, MD  HYDROcodone-acetaminophen (NORCO) 10-325 MG per tablet Take 1 tablet by mouth every 4 (four) hours as needed for moderate pain (pain).    Yes Historical Provider, MD  levothyroxine (SYNTHROID, LEVOTHROID) 75 MCG tablet Take 75 mcg by mouth daily before breakfast.    Yes Historical Provider, MD  lovastatin (MEVACOR) 40 MG tablet TAKE (1) TABLET BY MOUTH AT BEDTIME. 11/23/14  Yes Antoine Poche, MD  Multiple Vitamin (MULTIVITAMIN WITH MINERALS) TABS tablet Take 1 tablet by mouth 2 (two) times daily. 08/28/13  Yes Antoine Poche, MD  nitroGLYCERIN (NITROSTAT) 0.4 MG SL tablet Place 0.4 mg under the tongue every 5 (five) minutes as needed for chest pain. If no relief in 15 minutes call 911   Yes Historical Provider, MD  omeprazole (PRILOSEC) 20 MG capsule TAKE ONE BY MOUTH CAPSULE DAILY. 06/05/14  Yes Antoine Poche, MD  potassium chloride SA (K-DUR,KLOR-CON) 20 MEQ tablet TAKE (1) TABLET BY MOUTH EACH MORNING. 01/09/15  Yes Jodelle Gross, NP  prasugrel (EFFIENT) 10 MG TABS tablet Take 1 tablet (10 mg total) by mouth daily. 05/25/14  Yes Rhonda G Barrett, PA-C  TOPROL XL 50 MG 24 hr tablet TAKE (1) TABLET BY MOUTH EACH MORNING WITH OR IMMEDIATELY FOLLOWING AMEAL. 07/25/14  Yes Jodelle Gross, NP  zolpidem (AMBIEN) 5 MG tablet Take 5 mg by mouth at bedtime.   Yes Historical Provider, MD   BP 132/58 mmHg  Pulse 84  Temp(Src) 98 F (36.7 C) (Oral)  Resp 18  Ht  (1.626 m)  Wt 239 lb (108.41 kg)  BMI 41.00 kg/m2  SpO2  99% Physical Exam  Constitutional: She appears well-developed and well-nourished. No distress.  HENT:  Head: Normocephalic and atraumatic.  Eyes: Conjunctivae are normal. Right eye exhibits no discharge. Left eye exhibits no discharge.  Neck: Neck supple.  Cardiovascular: Normal rate, regular rhythm and normal heart sounds.  Exam reveals no gallop and no friction rub.   No murmur heard. Pulmonary/Chest: Effort normal and breath sounds normal. No respiratory distress.  Abdominal: Soft. She exhibits no distension.  There is tenderness.  LLQ tenderness w/o rebound or guarding  Musculoskeletal: She exhibits no edema or tenderness.  Neurological: She is alert.  Skin: Skin is warm and dry.  Psychiatric: She has a normal mood and affect. Her behavior is normal. Thought content normal.  Nursing note and vitals reviewed.   ED Course  Procedures (including critical care time) Labs Review Labs Reviewed  BASIC METABOLIC PANEL - Abnormal; Notable for the following:    Glucose, Bld 146 (*)    All other components within normal limits  URINALYSIS, ROUTINE W REFLEX MICROSCOPIC - Abnormal; Notable for the following:    Specific Gravity, Urine >1.030 (*)    Ketones, ur TRACE (*)    Protein, ur 30 (*)    All other components within normal limits  URINE MICROSCOPIC-ADD ON - Abnormal; Notable for the following:    Squamous Epithelial / LPF FEW (*)    Bacteria, UA FEW (*)    All other components within normal limits  CBC WITH DIFFERENTIAL/PLATELET    Imaging Review Ct Abdomen Pelvis W Contrast  02/05/2015   CLINICAL DATA:  59 year old female with generalized abdominal pain and medical history of intermittent diverticulitis.  EXAM: CT ABDOMEN AND PELVIS WITH CONTRAST  TECHNIQUE: Multidetector CT imaging of the abdomen and pelvis was performed using the standard protocol following bolus administration of intravenous contrast.  CONTRAST:  100mL OMNIPAQUE IOHEXOL 300 MG/ML SOLN, 50mL OMNIPAQUE IOHEXOL  300 MG/ML SOLN  COMPARISON:  Prior CT abdomen/ pelvis 12/03/2011  FINDINGS: Lower Chest: The lung bases are clear. Visualized cardiac structures are within normal limits for size. No pericardial effusion. Small hiatal hernia.  Abdomen: Unremarkable CT appearance of the of the stomach, duodenum, spleen, adrenal glands and pancreas. Normal hepatic contour morphology. No discrete hepatic lesion. Surgical changes of prior cholecystectomy. No intra or extrahepatic biliary ductal dilatation.  Unremarkable appearance of the bilateral kidneys. No focal solid lesion, hydronephrosis or nephrolithiasis.  Colonic diverticular disease without CT evidence of active inflammation. No focal bowel wall thickening or evidence of obstruction. The appendix is surgically absent. Unremarkable terminal ileum. No free fluid or suspicious adenopathy.  Pelvis: Surgical changes of prior hysterectomy. Unremarkable bladder. No free fluid or adenopathy.  Bones/Soft Tissues: No acute fracture or aggressive appearing lytic or blastic osseous lesion. L5-S1 degenerative disc disease.  Vascular: Atherosclerotic vascular disease without significant stenosis or aneurysmal dilatation.  IMPRESSION: 1. No acute abnormality in the abdomen or pelvis to explain the patient's clinical symptoms. 2. Colonic diverticular disease without CT evidence of active inflammation. 3. Small hiatal hernia. 4. Scattered atherosclerotic vascular calcifications. 5. Surgical changes of prior cholecystectomy, appendectomy and hysterectomy. 6. Focal L5-S1 degenerative disc disease.   Electronically Signed   By: Malachy MoanHeath  McCullough M.D.   On: 02/05/2015 17:47     EKG Interpretation None      MDM   Final diagnoses:  LLQ pain    59 year old female with lower abdominal pain. Patient is status post appendectomy, cholecystectomy and hysterectomy. Tenderness without peritoneal signs. CT without evidence of acute abnormality. Afebrile. Nontoxic appearing. Labs unremarkable.  Patient already prescribed oxycodone 10 mg for chronic pain. It has been determined that no acute conditions requiring further emergency intervention are present at this time. The patient has been advised of the diagnosis and plan. I reviewed any labs and imaging including any potential incidental findings. We have discussed signs and symptoms that warrant return to the ED and they are listed in the discharge instructions.      Jeannett SeniorStephen  Juleen China, MD 02/09/15 2238

## 2015-02-05 NOTE — ED Notes (Signed)
Pt c/o pain in LLQ since yesterday.  Reports few episodes of diarrhea and nausea, no vomiting.  Denies any black or bloody stools.  Reports history of diverticulitis.    Denies pain or burning with urination.

## 2015-02-05 NOTE — Discharge Instructions (Signed)

## 2015-02-06 ENCOUNTER — Encounter: Payer: Self-pay | Admitting: Orthopedic Surgery

## 2015-02-18 ENCOUNTER — Ambulatory Visit (INDEPENDENT_AMBULATORY_CARE_PROVIDER_SITE_OTHER): Payer: Medicaid Other | Admitting: Orthopedic Surgery

## 2015-02-18 ENCOUNTER — Encounter: Payer: Self-pay | Admitting: Orthopedic Surgery

## 2015-02-18 ENCOUNTER — Telehealth: Payer: Self-pay | Admitting: Orthopedic Surgery

## 2015-02-18 VITALS — BP 106/43 | Ht 64.0 in | Wt 239.0 lb

## 2015-02-18 DIAGNOSIS — M654 Radial styloid tenosynovitis [de Quervain]: Secondary | ICD-10-CM

## 2015-02-18 NOTE — Patient Instructions (Signed)
To [facility] for thumb splints

## 2015-02-18 NOTE — Progress Notes (Signed)
   Subjective:    Patient ID: [name], female    DOB: 18-Oct-1955, 59 y.o.   MRN: [id number]  Chief Complaint  Patient presents with  . Follow-up    er follow up left wrist pain s/p fall Jan 2016, ref. Juanetta GoslingHawkins    HPI  59 year old female fell on both wrists back in January had x-rays of her left wrist which were normal she complains of bilateral wrist pain over the radial aspects of the wrist. She specifically indicates she can't lift a jug of milk. Her pain is worse and she rates it 8-10 out of 10 despite negative x-rays.  She describes sharp throbbing burning stabbing aching pain on the radial side of both wrists. She was treated with steroids ice and splinting.  Review of Systems  Constitutional: Positive for fatigue.  HENT: Positive for dental problem and sinus pressure.   Respiratory: Positive for shortness of breath.   Cardiovascular: Positive for chest pain and leg swelling.  Gastrointestinal: Positive for nausea, abdominal pain and constipation.  Endocrine: Positive for heat intolerance.  Genitourinary: Positive for frequency and enuresis.  Neurological: Positive for dizziness and weakness.  Psychiatric/Behavioral: Positive for agitation.       Objective:   Physical Exam BP 106/43 mmHg  Ht 5\' 4"  (1.626 m)  Wt 239 lb (108.41 kg)  BMI 41.00 kg/m2 Normal grooming and hygiene. Oriented 3. Mood affect normal. Bilateral wrist exam tenderness over the first extensor compartment painful ulnar deviation painful wrist flexion extension with decreased range of motion of the wrists are stable motor exam is normal skin is intact sensation is normal and pulses are good       Assessment & Plan:   Encounter Diagnosis  Name Primary?  [name] De Quervain's syndrome (tenosynovitis) Yes    [facility](Medicaid patient) to get thumb spica splint which she is to wear for 6 weeks and then follow-up. I may inject if no improvement

## 2015-02-18 NOTE — Telephone Encounter (Signed)
Patient called stating that the wrist splints that were ordered from [facility] for her Medicaid does not cover the price of those and she cant pay $100 apiece out of pocket for those, please advise?

## 2015-02-18 NOTE — Telephone Encounter (Signed)
Is there an alternative?

## 2015-02-19 NOTE — Telephone Encounter (Signed)
[facility] for the thumb that she can find

## 2015-02-20 ENCOUNTER — Ambulatory Visit: Payer: Medicaid Other | Attending: Pulmonary Disease | Admitting: Sleep Medicine

## 2015-02-20 DIAGNOSIS — G4733 Obstructive sleep apnea (adult) (pediatric): Secondary | ICD-10-CM | POA: Insufficient documentation

## 2015-02-20 DIAGNOSIS — G4730 Sleep apnea, unspecified: Secondary | ICD-10-CM | POA: Diagnosis present

## 2015-02-20 NOTE — Telephone Encounter (Signed)
Patient aware.

## 2015-02-23 NOTE — Sleep Study (Signed)
[facility] Darcey Demma A. Gerilyn Pilgrim, MD     [email or web address]        NOCTURNAL POLYSOMNOGRAM    LOCATION: SLEEP LAB FACILITY: [place]   PHYSICIAN: Donya Tomaro A. Gerilyn Pilgrim, M.D.   DATE OF STUDY: 02/20/2015.   REFERRING PHYSICIAN: Kari Baars.   INDICATIONS: The patient is a 59 year old female who presents with witnessed apnea, fatigue and snoring.  MEDICATIONS:  Prior to Admission medications   Medication Sig Start Date End Date Taking? Authorizing Provider  albuterol (PROAIR HFA) 108 (90 BASE) MCG/ACT inhaler Inhale 2 puffs into the lungs every 6 (six) hours as needed for wheezing or shortness of breath.    Historical Provider, MD  albuterol (PROVENTIL) (2.5 MG/3ML) 0.083% nebulizer solution Take 2.5 mg by nebulization every 6 (six) hours as needed for wheezing or shortness of breath.    Historical Provider, MD  ALPRAZolam Prudy Feeler) 1 MG tablet Take 1 mg by mouth 4 (four) times daily as needed for anxiety or sleep.     Historical Provider, MD  amLODipine (NORVASC) 10 MG tablet Take 1 tablet (10 mg total) by mouth daily. 11/15/14   Jodelle Gross, NP  aspirin 325 MG tablet Take 162.5 mg by mouth daily.     Historical Provider, MD  benazepril (LOTENSIN) 20 MG tablet TAKE (1) TABLET BY MOUTH EACH MORNING. 08/27/14   Jodelle Gross, NP  furosemide (LASIX) 80 MG tablet Take 80 mg by mouth daily as needed for fluid or edema (swelling).    Historical Provider, MD  HYDROcodone-acetaminophen (NORCO) 10-325 MG per tablet Take 1 tablet by mouth every 4 (four) hours as needed for moderate pain (pain).     Historical Provider, MD  levothyroxine (SYNTHROID, LEVOTHROID) 75 MCG tablet Take 75 mcg by mouth daily before breakfast.     Historical Provider, MD  lovastatin (MEVACOR) 40 MG tablet TAKE (1) TABLET BY MOUTH AT BEDTIME. 11/23/14   Antoine Poche, MD  methocarbamol (ROBAXIN) 500 MG tablet Take 1 tablet (500 mg total) by mouth 2 (two) times daily as needed for muscle spasms. 02/05/15    Raeford Razor, MD  Multiple Vitamin (MULTIVITAMIN WITH MINERALS) TABS tablet Take 1 tablet by mouth 2 (two) times daily. 08/28/13   Antoine Poche, MD  nitroGLYCERIN (NITROSTAT) 0.4 MG SL tablet Place 0.4 mg under the tongue every 5 (five) minutes as needed for chest pain. If no relief in 15 minutes call 911    Historical Provider, MD  omeprazole (PRILOSEC) 20 MG capsule TAKE ONE BY MOUTH CAPSULE DAILY. 06/05/14   Antoine Poche, MD  potassium chloride SA (K-DUR,KLOR-CON) 20 MEQ tablet TAKE (1) TABLET BY MOUTH EACH MORNING. 01/09/15   Jodelle Gross, NP  prasugrel (EFFIENT) 10 MG TABS tablet Take 1 tablet (10 mg total) by mouth daily. 05/25/14   Rhonda G Barrett, PA-C  TOPROL XL 50 MG 24 hr tablet TAKE (1) TABLET BY MOUTH EACH MORNING WITH OR IMMEDIATELY FOLLOWING AMEAL. 07/25/14   Jodelle Gross, NP  zolpidem (AMBIEN) 5 MG tablet Take 5 mg by mouth at bedtime.    Historical Provider, MD      EPWORTH SLEEPINESS SCALE: 3.   BMI: 42.   ARCHITECTURAL SUMMARY: Total recording time was 428 minutes. Sleep efficiency 55 %. Sleep latency 95 minutes. REM latency 252 minutes. Stage NI 9 %, N2 78 % and N3 5 % and REM sleep 8 %.    RESPIRATORY DATA:  The study is a split-night recording with initial portion been a diagnostic  the second portion a titration recording. Baseline oxygen saturation is 95 %. The lowest saturation is 83 %. The diagnostic AHI is 36. Most of the apneic events were central in nature. Additionally, most of the high popliteal events appears central. The patient was placed on positive pressure starting at 5 and increased to 14. There is no clear optimal pressures but she tended to do their on the moderate range pressures 9-12. Higher pressures tend to cause central events.   LIMB MOVEMENT SUMMARY: PLM index 0.   ELECTROCARDIOGRAM SUMMARY: Average heart rate is 71 with no significant dysrhythmias observed.   IMPRESSION:  1. Moderate to severe complex sleep apnea syndrome  with a combination of central and obstructive sleep apnea syndrome. The patient had a decent response to positive pressures mid range 9-12. I suggest that she be placed on AutoPap 9-13. 2. Abnormal sleep architecture with reduced sleep efficiency.  Thanks for this referral.  Alaiyah Bollman A. Gerilyn Pilgrimoonquah, M.D. Diplomat, Biomedical engineerAmerican Board of Sleep Medicine.

## 2015-03-20 ENCOUNTER — Other Ambulatory Visit: Payer: Self-pay | Admitting: Adult Health

## 2015-03-25 ENCOUNTER — Other Ambulatory Visit (HOSPITAL_COMMUNITY): Payer: Self-pay | Admitting: Pulmonary Disease

## 2015-03-25 DIAGNOSIS — N63 Unspecified lump in unspecified breast: Secondary | ICD-10-CM

## 2015-03-26 ENCOUNTER — Other Ambulatory Visit: Payer: Self-pay | Admitting: Adult Health

## 2015-04-01 ENCOUNTER — Encounter: Payer: Self-pay | Admitting: Adult Health

## 2015-04-01 NOTE — Progress Notes (Signed)
Cardiology Office Note   Date:  04/01/2015   ID:  [name], DOB 07-04-1956, MRN [id number]  PCP:  Fredirick MaudlinHAWKINS,EDWARD L, MD  Cardiologist:  [place] CalixBranch/ Ngai Parcell, NP   Cancelled appt.   ERROR

## 2015-04-02 ENCOUNTER — Ambulatory Visit: Payer: Medicaid Other | Admitting: Orthopedic Surgery

## 2015-04-09 ENCOUNTER — Other Ambulatory Visit (HOSPITAL_COMMUNITY): Payer: Self-pay | Admitting: Cardiology

## 2015-04-09 ENCOUNTER — Other Ambulatory Visit (HOSPITAL_COMMUNITY): Payer: Self-pay | Admitting: Pulmonary Disease

## 2015-04-09 ENCOUNTER — Ambulatory Visit (HOSPITAL_COMMUNITY)
Admission: RE | Admit: 2015-04-09 | Discharge: 2015-04-09 | Disposition: A | Discharge status: Home / Self Care | Payer: Medicaid Other | Source: Ambulatory Visit | Attending: Pulmonary Disease | Admitting: Pulmonary Disease

## 2015-04-09 DIAGNOSIS — N63 Unspecified lump in unspecified breast: Secondary | ICD-10-CM

## 2015-04-09 DIAGNOSIS — R079 Chest pain, unspecified: Secondary | ICD-10-CM

## 2015-04-11 ENCOUNTER — Encounter: Payer: Self-pay | Admitting: Cardiology

## 2015-04-11 NOTE — Progress Notes (Signed)
ERROR

## 2015-04-23 ENCOUNTER — Encounter: Payer: Self-pay | Admitting: Orthopedic Surgery

## 2015-04-23 ENCOUNTER — Ambulatory Visit: Payer: Medicaid Other | Admitting: Orthopedic Surgery

## 2015-04-24 ENCOUNTER — Ambulatory Visit: Payer: Self-pay | Admitting: Cardiology

## 2015-05-08 ENCOUNTER — Encounter: Payer: Self-pay | Admitting: Cardiology

## 2015-05-08 NOTE — Progress Notes (Signed)
Patient ID: [name], female   DOB: 07-23-1956, 59 y.o.   MRN: [id number]     ERROR

## 2015-05-09 ENCOUNTER — Other Ambulatory Visit: Payer: Self-pay | Admitting: Cardiology

## 2015-05-14 ENCOUNTER — Encounter: Payer: Medicaid Other | Admitting: Cardiology

## 2015-05-14 NOTE — Progress Notes (Signed)
Patient ID: [name], female   DOB: 02/04/1956, 59 y.o.   MRN: [id number]    ERROR  

## 2015-05-16 ENCOUNTER — Other Ambulatory Visit: Payer: Self-pay | Admitting: Physician Assistant

## 2015-05-27 ENCOUNTER — Ambulatory Visit (INDEPENDENT_AMBULATORY_CARE_PROVIDER_SITE_OTHER): Payer: Medicaid Other | Admitting: Orthopedic Surgery

## 2015-05-27 ENCOUNTER — Encounter: Payer: Self-pay | Admitting: Orthopedic Surgery

## 2015-05-27 VITALS — BP 118/66 | Ht 64.0 in | Wt 238.0 lb

## 2015-05-27 DIAGNOSIS — M654 Radial styloid tenosynovitis [de Quervain]: Secondary | ICD-10-CM | POA: Diagnosis not present

## 2015-05-27 DIAGNOSIS — M1811 Unilateral primary osteoarthritis of first carpometacarpal joint, right hand: Secondary | ICD-10-CM

## 2015-05-27 NOTE — Progress Notes (Signed)
Patient ID: [name], female   DOB: 28-Jun-1956, 59 y.o.   MRN: [id number]  Follow up visit  Chief Complaint  Patient presents with  . Follow-up    6 week recheck on left dequervains after wearing splint.    BP 118/66 mmHg  Ht  (1.626 m)  Wt 238 lb (107.956 kg)  BMI 40.83 kg/m2  Encounter Diagnosis  Name Primary?  Tommi Rumps Quervain's syndrome (tenosynovitis) Yes    The patient's left wrist is more symptomatic than the right although the right is more symptomatic over the [facility] joint. She basically has pain in the right [facility] joint with crepitance and weak pinch and painful pinch with radiating pain up into the right forearm  On the left side she has persistent ulnar deviation pain over the first extensor compartment  She also has a right ring finger trigger phenomenon  Left wrist examination tenderness over the first 6 extensor compartment painful but normal range of motion and ulnar deviation  On the right side she has tenderness over the [facility] joint with crepitance weak pinch  The range of motion in both wrist is otherwise normal skin is intact no sensory deficit she also did has tenderness over the A1 pulley of the right ring finger with catching locking demonstrated  We injected the [facility] joint on the right and we injected the first extensor compartment on the left  Return 1 week inject RRF   Injection right thumb [facility] joint  We first obtain verbal consent and then confirmed site with timeout  A steroid injection was performed at [facility] joint right thumb using 1% plain Lidocaine and 40 mg of Depo-Medrol. This was well tolerated.  Injection left first extensor compartment We first obtain verbal consent and then confirmed site with timeout The injection was performed at the first extensor compartment of the left wrist using 1% lidocaine and 40 mg of Depo-Medrol this was well-tolerated  Both injections used alcohol and ethyl chloride for skin prep

## 2015-05-28 ENCOUNTER — Other Ambulatory Visit: Payer: Self-pay | Admitting: Cardiology

## 2015-06-03 ENCOUNTER — Encounter (HOSPITAL_COMMUNITY)
Admission: EM | Disposition: A | Discharge status: Home / Self Care | Payer: Self-pay | Source: Home / Self Care | Attending: Emergency Medicine

## 2015-06-03 ENCOUNTER — Encounter (HOSPITAL_COMMUNITY): Payer: Self-pay | Admitting: Emergency Medicine

## 2015-06-03 ENCOUNTER — Observation Stay (HOSPITAL_COMMUNITY)
Admission: EM | Admit: 2015-06-03 | Discharge: 2015-06-05 | Disposition: A | Discharge status: Home / Self Care | Payer: Medicaid Other | Attending: Cardiology | Admitting: Cardiology

## 2015-06-03 ENCOUNTER — Emergency Department (HOSPITAL_COMMUNITY): Payer: Medicaid Other

## 2015-06-03 DIAGNOSIS — R51 Headache: Secondary | ICD-10-CM | POA: Diagnosis not present

## 2015-06-03 DIAGNOSIS — I2582 Chronic total occlusion of coronary artery: Secondary | ICD-10-CM | POA: Insufficient documentation

## 2015-06-03 DIAGNOSIS — Z6841 Body Mass Index (BMI) 40.0 and over, adult: Secondary | ICD-10-CM | POA: Diagnosis not present

## 2015-06-03 DIAGNOSIS — G4733 Obstructive sleep apnea (adult) (pediatric): Secondary | ICD-10-CM | POA: Diagnosis not present

## 2015-06-03 DIAGNOSIS — K219 Gastro-esophageal reflux disease without esophagitis: Secondary | ICD-10-CM | POA: Diagnosis not present

## 2015-06-03 DIAGNOSIS — Z9861 Coronary angioplasty status: Secondary | ICD-10-CM

## 2015-06-03 DIAGNOSIS — E039 Hypothyroidism, unspecified: Secondary | ICD-10-CM | POA: Diagnosis not present

## 2015-06-03 DIAGNOSIS — Z79891 Long term (current) use of opiate analgesic: Secondary | ICD-10-CM | POA: Insufficient documentation

## 2015-06-03 DIAGNOSIS — F17210 Nicotine dependence, cigarettes, uncomplicated: Secondary | ICD-10-CM | POA: Insufficient documentation

## 2015-06-03 DIAGNOSIS — I10 Essential (primary) hypertension: Secondary | ICD-10-CM | POA: Diagnosis present

## 2015-06-03 DIAGNOSIS — Z955 Presence of coronary angioplasty implant and graft: Secondary | ICD-10-CM | POA: Insufficient documentation

## 2015-06-03 DIAGNOSIS — Z79899 Other long term (current) drug therapy: Secondary | ICD-10-CM | POA: Insufficient documentation

## 2015-06-03 DIAGNOSIS — I255 Ischemic cardiomyopathy: Secondary | ICD-10-CM | POA: Diagnosis present

## 2015-06-03 DIAGNOSIS — J45909 Unspecified asthma, uncomplicated: Secondary | ICD-10-CM | POA: Insufficient documentation

## 2015-06-03 DIAGNOSIS — Z7902 Long term (current) use of antithrombotics/antiplatelets: Secondary | ICD-10-CM | POA: Insufficient documentation

## 2015-06-03 DIAGNOSIS — Z7982 Long term (current) use of aspirin: Secondary | ICD-10-CM | POA: Insufficient documentation

## 2015-06-03 DIAGNOSIS — Z951 Presence of aortocoronary bypass graft: Secondary | ICD-10-CM | POA: Diagnosis not present

## 2015-06-03 DIAGNOSIS — D649 Anemia, unspecified: Secondary | ICD-10-CM | POA: Diagnosis present

## 2015-06-03 DIAGNOSIS — E669 Obesity, unspecified: Secondary | ICD-10-CM | POA: Diagnosis not present

## 2015-06-03 DIAGNOSIS — I25110 Atherosclerotic heart disease of native coronary artery with unstable angina pectoris: Secondary | ICD-10-CM | POA: Diagnosis not present

## 2015-06-03 DIAGNOSIS — I25700 Atherosclerosis of coronary artery bypass graft(s), unspecified, with unstable angina pectoris: Principal | ICD-10-CM | POA: Insufficient documentation

## 2015-06-03 DIAGNOSIS — F419 Anxiety disorder, unspecified: Secondary | ICD-10-CM | POA: Insufficient documentation

## 2015-06-03 DIAGNOSIS — I252 Old myocardial infarction: Secondary | ICD-10-CM | POA: Diagnosis not present

## 2015-06-03 DIAGNOSIS — T463X5A Adverse effect of coronary vasodilators, initial encounter: Secondary | ICD-10-CM | POA: Insufficient documentation

## 2015-06-03 DIAGNOSIS — R079 Chest pain, unspecified: Secondary | ICD-10-CM | POA: Diagnosis present

## 2015-06-03 DIAGNOSIS — J449 Chronic obstructive pulmonary disease, unspecified: Secondary | ICD-10-CM | POA: Insufficient documentation

## 2015-06-03 DIAGNOSIS — I739 Peripheral vascular disease, unspecified: Secondary | ICD-10-CM | POA: Diagnosis present

## 2015-06-03 DIAGNOSIS — I200 Unstable angina: Secondary | ICD-10-CM | POA: Diagnosis not present

## 2015-06-03 DIAGNOSIS — F329 Major depressive disorder, single episode, unspecified: Secondary | ICD-10-CM | POA: Diagnosis not present

## 2015-06-03 DIAGNOSIS — I2510 Atherosclerotic heart disease of native coronary artery without angina pectoris: Secondary | ICD-10-CM

## 2015-06-03 DIAGNOSIS — E785 Hyperlipidemia, unspecified: Secondary | ICD-10-CM | POA: Diagnosis present

## 2015-06-03 DIAGNOSIS — E875 Hyperkalemia: Secondary | ICD-10-CM | POA: Diagnosis not present

## 2015-06-03 DIAGNOSIS — I6523 Occlusion and stenosis of bilateral carotid arteries: Secondary | ICD-10-CM | POA: Insufficient documentation

## 2015-06-03 HISTORY — PX: CARDIAC CATHETERIZATION: SHX172

## 2015-06-03 HISTORY — DX: Essential (primary) hypertension: I10

## 2015-06-03 LAB — BASIC METABOLIC PANEL
Anion gap: 7 (ref 5–15)
BUN: 15 mg/dL (ref 6–20)
CHLORIDE: 103 mmol/L (ref 101–111)
CO2: 26 mmol/L (ref 22–32)
CREATININE: 0.98 mg/dL (ref 0.44–1.00)
Calcium: 8.7 mg/dL — ABNORMAL LOW (ref 8.9–10.3)
GFR calc Af Amer: >60 mL/min (ref >60)
GFR calc non Af Amer: >60 mL/min (ref >60)
Glucose, Bld: 89 mg/dL (ref 65–99)
POTASSIUM: 5.9 mmol/L — AB (ref 3.5–5.1)
Sodium: 136 mmol/L (ref 135–145)

## 2015-06-03 LAB — I-STAT CHEM 8, ED
BUN: 17 mg/dL (ref 6–20)
BUN: 25 mg/dL — ABNORMAL HIGH (ref 6–20)
Calcium, Ion: 1.05 mmol/L — ABNORMAL LOW (ref 1.12–1.23)
Calcium, Ion: 1.1 mmol/L — ABNORMAL LOW (ref 1.12–1.23)
Chloride: 103 mmol/L (ref 101–111)
Chloride: 103 mmol/L (ref 101–111)
Creatinine, Ser: 0.9 mg/dL (ref 0.44–1.00)
Creatinine, Ser: 1 mg/dL (ref 0.44–1.00)
Glucose, Bld: 77 mg/dL (ref 65–99)
Glucose, Bld: 92 mg/dL (ref 65–99)
HEMATOCRIT: 36 % (ref 36.0–46.0)
HEMATOCRIT: 36 % (ref 36.0–46.0)
HEMOGLOBIN: 12.2 g/dL (ref 12.0–15.0)
HEMOGLOBIN: 12.2 g/dL (ref 12.0–15.0)
POTASSIUM: 6.1 mmol/L — AB (ref 3.5–5.1)
Potassium: 4.4 mmol/L (ref 3.5–5.1)
SODIUM: 135 mmol/L (ref 135–145)
SODIUM: 138 mmol/L (ref 135–145)
TCO2: 26 mmol/L (ref 0–100)
TCO2: 24 mmol/L (ref 0–100)

## 2015-06-03 LAB — TROPONIN I

## 2015-06-03 LAB — CBC
HEMATOCRIT: 35 % — AB (ref 36.0–46.0)
Hemoglobin: 11.6 g/dL — ABNORMAL LOW (ref 12.0–15.0)
MCH: 30.7 pg (ref 26.0–34.0)
MCHC: 33.1 g/dL (ref 30.0–36.0)
MCV: 92.6 fL (ref 78.0–100.0)
PLATELETS: 233 10*3/uL (ref 150–400)
RBC: 3.78 MIL/uL — ABNORMAL LOW (ref 3.87–5.11)
RDW: 13.3 % (ref 11.5–15.5)
WBC: 7.7 10*3/uL (ref 4.0–10.5)

## 2015-06-03 LAB — PROTIME-INR
INR: 1.35 (ref 0.00–1.49)
Prothrombin Time: 16.8 seconds — ABNORMAL HIGH (ref 11.6–15.2)

## 2015-06-03 LAB — I-STAT TROPONIN, ED: Troponin i, poc: 0.01 ng/mL (ref 0.00–0.08)

## 2015-06-03 LAB — POCT ACTIVATED CLOTTING TIME: Activated Clotting Time: 134 seconds

## 2015-06-03 SURGERY — LEFT HEART CATH AND CORS/GRAFTS ANGIOGRAPHY

## 2015-06-03 MED ORDER — ACETAMINOPHEN 325 MG PO TABS: 650.0000 mg | ORAL_TABLET | ORAL | Status: DC | PRN

## 2015-06-03 MED ORDER — FENTANYL CITRATE (PF) 100 MCG/2ML IJ SOLN
100.0000 ug | Freq: Once | INTRAMUSCULAR | Status: AC
  Administered 2015-06-03: 100 ug via INTRAVENOUS
  Filled 2015-06-03: qty 2

## 2015-06-03 MED ORDER — SODIUM CHLORIDE 0.9 % IV SOLN
INTRAVENOUS | Status: DC
  Administered 2015-06-03: 17:00:00 via INTRAVENOUS

## 2015-06-03 MED ORDER — HEPARIN BOLUS VIA INFUSION
4000.0000 [IU] | Freq: Once | INTRAVENOUS | Status: AC
  Administered 2015-06-03: 4000 [IU] via INTRAVENOUS
  Filled 2015-06-03: qty 4000

## 2015-06-03 MED ORDER — SODIUM CHLORIDE 0.9 % WEIGHT BASED INFUSION: 1.0000 mL/kg/h | INTRAVENOUS | Status: AC

## 2015-06-03 MED ORDER — HEPARIN (PORCINE) IN NACL 100-0.45 UNIT/ML-% IJ SOLN
950.0000 [IU]/h | INTRAMUSCULAR | Status: DC
  Administered 2015-06-03: 950 [IU]/h via INTRAVENOUS
  Filled 2015-06-03: qty 250

## 2015-06-03 MED ORDER — METOPROLOL SUCCINATE ER 50 MG PO TB24: 50.0000 mg | ORAL_TABLET | Freq: Every day | ORAL | Status: DC

## 2015-06-03 MED ORDER — SODIUM CHLORIDE 0.9 % IJ SOLN: 3.0000 mL | INTRAMUSCULAR | Status: DC | PRN

## 2015-06-03 MED ORDER — SODIUM CHLORIDE 0.9 % IV SOLN: 250.0000 mL | INTRAVENOUS | Status: DC | PRN

## 2015-06-03 MED ORDER — ASPIRIN 325 MG PO TABS: 325.0000 mg | ORAL_TABLET | Freq: Every day | ORAL | Status: DC

## 2015-06-03 MED ORDER — ALBUTEROL SULFATE (2.5 MG/3ML) 0.083% IN NEBU: 2.5000 mg | INHALATION_SOLUTION | Freq: Four times a day (QID) | RESPIRATORY_TRACT | Status: DC | PRN

## 2015-06-03 MED ORDER — METOPROLOL SUCCINATE ER 50 MG PO TB24
50.0000 mg | ORAL_TABLET | Freq: Every day | ORAL | Status: DC
  Administered 2015-06-04 – 2015-06-05 (×2): 50 mg via ORAL
  Filled 2015-06-03 (×2): qty 1

## 2015-06-03 MED ORDER — HYDROCODONE-ACETAMINOPHEN 10-325 MG PO TABS
1.0000 | ORAL_TABLET | ORAL | Status: DC | PRN
Start: 2015-06-03 — End: 2015-06-05
  Administered 2015-06-03: 1 via ORAL
  Filled 2015-06-03 (×2): qty 1

## 2015-06-03 MED ORDER — PRASUGREL HCL 10 MG PO TABS
10.0000 mg | ORAL_TABLET | Freq: Every day | ORAL | Status: DC
  Administered 2015-06-04 – 2015-06-05 (×2): 10 mg via ORAL
  Filled 2015-06-03 (×2): qty 1

## 2015-06-03 MED ORDER — LEVOTHYROXINE SODIUM 75 MCG PO TABS
75.0000 ug | ORAL_TABLET | Freq: Every day | ORAL | Status: DC
  Administered 2015-06-04 – 2015-06-05 (×2): 75 ug via ORAL
  Filled 2015-06-03 (×2): qty 1

## 2015-06-03 MED ORDER — NITROGLYCERIN 0.4 MG SL SUBL: 0.4000 mg | SUBLINGUAL_TABLET | SUBLINGUAL | Status: DC | PRN

## 2015-06-03 MED ORDER — BENAZEPRIL HCL 20 MG PO TABS: 20.0000 mg | ORAL_TABLET | Freq: Every day | ORAL | Status: DC

## 2015-06-03 MED ORDER — SODIUM CHLORIDE 0.9 % IJ SOLN
3.0000 mL | Freq: Two times a day (BID) | INTRAMUSCULAR | Status: DC
  Administered 2015-06-03 – 2015-06-05 (×5): 3 mL via INTRAVENOUS

## 2015-06-03 MED ORDER — PRAVASTATIN SODIUM 20 MG PO TABS: 20.0000 mg | ORAL_TABLET | Freq: Every day | ORAL | Status: DC

## 2015-06-03 MED ORDER — ALPRAZOLAM 0.5 MG PO TABS
1.0000 mg | ORAL_TABLET | Freq: Four times a day (QID) | ORAL | Status: DC | PRN
  Administered 2015-06-03 – 2015-06-05 (×3): 1 mg via ORAL
  Filled 2015-06-03 (×3): qty 2

## 2015-06-03 MED ORDER — ZOLPIDEM TARTRATE 5 MG PO TABS: 5.0000 mg | ORAL_TABLET | Freq: Every evening | ORAL | Status: DC | PRN

## 2015-06-03 MED ORDER — ASPIRIN 325 MG PO TABS
325.0000 mg | ORAL_TABLET | Freq: Every day | ORAL | Status: DC
  Administered 2015-06-04: 325 mg via ORAL
  Filled 2015-06-03: qty 1

## 2015-06-03 MED ORDER — SODIUM CHLORIDE 0.9 % IJ SOLN
3.0000 mL | INTRAMUSCULAR | Status: DC | PRN
Start: 2015-06-03 — End: 2015-06-03

## 2015-06-03 MED ORDER — ADULT MULTIVITAMIN W/MINERALS CH
1.0000 | ORAL_TABLET | Freq: Two times a day (BID) | ORAL | Status: DC
  Administered 2015-06-03 – 2015-06-05 (×4): 1 via ORAL
  Filled 2015-06-03 (×4): qty 1

## 2015-06-03 MED ORDER — MIDAZOLAM HCL 2 MG/2ML IJ SOLN
INTRAMUSCULAR | Status: AC
  Filled 2015-06-03: qty 4

## 2015-06-03 MED ORDER — VERAPAMIL HCL 2.5 MG/ML IV SOLN
INTRAVENOUS | Status: AC
  Filled 2015-06-03: qty 2

## 2015-06-03 MED ORDER — LIDOCAINE HCL (PF) 1 % IJ SOLN
INTRAMUSCULAR | Status: DC | PRN
  Administered 2015-06-03: 18:00:00

## 2015-06-03 MED ORDER — SODIUM CHLORIDE 0.9 % IJ SOLN: 3.0000 mL | Freq: Two times a day (BID) | INTRAMUSCULAR | Status: DC

## 2015-06-03 MED ORDER — SODIUM CHLORIDE 0.9 % WEIGHT BASED INFUSION: 1.0000 mL/kg/h | INTRAVENOUS | Status: DC

## 2015-06-03 MED ORDER — HYDROMORPHONE HCL 1 MG/ML IJ SOLN: 1.0000 mg | Freq: Once | INTRAMUSCULAR | Status: DC

## 2015-06-03 MED ORDER — PANTOPRAZOLE SODIUM 40 MG PO TBEC
40.0000 mg | DELAYED_RELEASE_TABLET | Freq: Every day | ORAL | Status: DC
  Administered 2015-06-04 – 2015-06-05 (×2): 40 mg via ORAL
  Filled 2015-06-03 (×2): qty 1

## 2015-06-03 MED ORDER — FENTANYL CITRATE (PF) 100 MCG/2ML IJ SOLN
INTRAMUSCULAR | Status: DC | PRN
  Administered 2015-06-03: 50 ug via INTRAVENOUS

## 2015-06-03 MED ORDER — ONDANSETRON HCL 4 MG/2ML IJ SOLN
4.0000 mg | Freq: Once | INTRAMUSCULAR | Status: AC
  Administered 2015-06-03: 4 mg via INTRAVENOUS
  Filled 2015-06-03: qty 2

## 2015-06-03 MED ORDER — FENTANYL CITRATE (PF) 100 MCG/2ML IJ SOLN
INTRAMUSCULAR | Status: AC
  Filled 2015-06-03: qty 4

## 2015-06-03 MED ORDER — SODIUM CHLORIDE 0.9 % WEIGHT BASED INFUSION: 3.0000 mL/kg/h | INTRAVENOUS | Status: DC

## 2015-06-03 MED ORDER — ALPRAZOLAM 0.25 MG PO TABS
0.2500 mg | ORAL_TABLET | Freq: Two times a day (BID) | ORAL | Status: DC | PRN
Start: 2015-06-03 — End: 2015-06-03

## 2015-06-03 MED ORDER — ALBUTEROL SULFATE HFA 108 (90 BASE) MCG/ACT IN AERS: 2.0000 | INHALATION_SPRAY | Freq: Four times a day (QID) | RESPIRATORY_TRACT | Status: DC | PRN

## 2015-06-03 MED ORDER — HEPARIN SODIUM (PORCINE) 1000 UNIT/ML IJ SOLN
INTRAMUSCULAR | Status: AC
  Filled 2015-06-03: qty 1

## 2015-06-03 MED ORDER — MIDAZOLAM HCL 2 MG/2ML IJ SOLN
INTRAMUSCULAR | Status: DC | PRN
  Administered 2015-06-03: 1 mg via INTRAVENOUS

## 2015-06-03 MED ORDER — AMLODIPINE BESYLATE 10 MG PO TABS
10.0000 mg | ORAL_TABLET | Freq: Every day | ORAL | Status: DC
  Administered 2015-06-04 – 2015-06-05 (×2): 10 mg via ORAL
  Filled 2015-06-03 (×2): qty 1

## 2015-06-03 MED ORDER — AMLODIPINE BESYLATE 10 MG PO TABS: 10.0000 mg | ORAL_TABLET | Freq: Every day | ORAL | Status: DC

## 2015-06-03 MED ORDER — IOHEXOL 350 MG/ML SOLN
INTRAVENOUS | Status: DC | PRN
  Administered 2015-06-03: 85 mL via INTRAVENOUS

## 2015-06-03 MED ORDER — LIDOCAINE HCL (PF) 1 % IJ SOLN
INTRAMUSCULAR | Status: AC
  Filled 2015-06-03: qty 30

## 2015-06-03 MED ORDER — HEPARIN (PORCINE) IN NACL 2-0.9 UNIT/ML-% IJ SOLN
INTRAMUSCULAR | Status: AC
  Filled 2015-06-03: qty 1000

## 2015-06-03 MED ORDER — BENAZEPRIL HCL 20 MG PO TABS
20.0000 mg | ORAL_TABLET | Freq: Every day | ORAL | Status: DC
  Administered 2015-06-04 – 2015-06-05 (×2): 20 mg via ORAL
  Filled 2015-06-03 (×2): qty 1

## 2015-06-03 MED ORDER — HEPARIN SODIUM (PORCINE) 5000 UNIT/ML IJ SOLN: 5000.0000 [IU] | Freq: Three times a day (TID) | INTRAMUSCULAR | Status: DC

## 2015-06-03 MED ORDER — DOXEPIN HCL 25 MG PO CAPS
25.0000 mg | ORAL_CAPSULE | Freq: Every day | ORAL | Status: DC
  Administered 2015-06-03 – 2015-06-04 (×2): 25 mg via ORAL
  Filled 2015-06-03 (×4): qty 1

## 2015-06-03 MED ORDER — ONDANSETRON HCL 4 MG/2ML IJ SOLN: 4.0000 mg | Freq: Four times a day (QID) | INTRAMUSCULAR | Status: DC | PRN

## 2015-06-03 MED ORDER — PANTOPRAZOLE SODIUM 40 MG PO TBEC: 40.0000 mg | DELAYED_RELEASE_TABLET | Freq: Every day | ORAL | Status: DC

## 2015-06-03 SURGICAL SUPPLY — 8 items
CATH INFINITI 5 FR IM (CATHETERS) ×2 IMPLANT
CATH INFINITI 5FR JL4 (CATHETERS) ×2 IMPLANT
CATH SITESEER 5F MULTI A 2 (CATHETERS) ×2 IMPLANT
KIT HEART LEFT (KITS) ×2 IMPLANT
PACK CARDIAC CATHETERIZATION (CUSTOM PROCEDURE TRAY) ×2 IMPLANT
SHEATH PINNACLE 5F 10CM (SHEATH) ×2 IMPLANT
TRANSDUCER W/STOPCOCK (MISCELLANEOUS) ×2 IMPLANT
WIRE EMERALD 3MM-J .035X150CM (WIRE) ×2 IMPLANT

## 2015-06-03 NOTE — Progress Notes (Signed)
Pt admitted with chest pain 6/10. However, blood pressure was 104/57 and pt already taken 5 nitro before came to floor. MD was notified and agreed that blood pressure too low to give additional nitro before scheduled cardiac catheterization. Pt placed on 2L of oxygen for comfort. Heparin drip and fluid started, consent signed, 2nd IV site obtained, and pt sent to cardiac catheterization.

## 2015-06-03 NOTE — ED Notes (Signed)
MD at bedside. Cardiology 

## 2015-06-03 NOTE — H&P (Addendum)
History and Physical   Patient ID: ANALEYA [name] MRN: [id number], DOB/AGE: [birth year] 59 y.o. Date of Encounter: 06/03/2015  Primary Physician: Fredirick Maudlin, MD Primary Cardiologist: Dr Wyline Mood  Chief Complaint:  Chest pain.  HPI: [name] is a 59 y.o. female with a history of CABG 2005, DES to pSVG-RPDA 2015, mod carotid dz, HTN, HL, GERD, hypothyroid, anemia and ICM w/ EF 50-55% in 03/2014.   For the last 4 days, [name] has been getting intermittent chest pain. The pain is described a squeezing and shooting. The episodes start in her chest, radiate to her L arm and jaw. The worst episode was 10/10. She has some nausea (no vomiting), and some SOB with the pain.   Today, the pain was severe. She took SL NTG x 3 at home, with little relief, but they were old. She chewed a 325 mg aspirin. When her pain did not improve, she called EMS. They gave her 2 SL NTG with improvement in her pain. She feels the EMS NTG was fresher and worked better than hers did. In the ER, she was given Zofran 4 mg and fentanyl 100 mcg with relief in her pain. No change in deep inspiration or movement. No chest wall tenderness.  Today, she took all her home BP medications. She takes Lasix and K+ prn for edema (cannot afford scales) and last took them 2 days ago.    Past Medical History  Diagnosis Date  . Coronary artery disease     a. s/p prior CABG 2005. b. BMS 02/2009 to VG-PDA. c. NSTEMI 08/2010 s/p PTCA/DES to SVG-PDA in previously stented area. d. DES to SVG to RCA on 05/24/14  . Hypertension   . Hyperlipidemia   . GERD (gastroesophageal reflux disease)   . Diverticulitis     4 documented episodes by CT; most recent April 2012  . Depression   . Anxiety   . Hypothyroidism   . MI (myocardial infarction) 2005; 2009; 08/2010 X 2  . COPD (chronic obstructive pulmonary disease)   . Asthma   . Sleep apnea     a. Mild-to-moderate obstructive sleep apnea diagnosed in April 2012 by sleep study.    . Anemia     "related to chole OR"  . History of blood transfusion     "several times; w/chole; pneumonia, diverticulitis"  . Tension headache   . Seizures     "stress related"  . Arthritis   . Chronic back pain   . DDD (degenerative disc disease)   . Pneumonia 04/2011  . Kidney stones   . Acute renal failure ~ 04/2011    "when I had pneumonia"  . Ischemic cardiomyopathy     a. EF 40% in 2012 by cath. b. improved to 50-55% by cath 05/2014   . Carotid artery disease     a. Duplex 50-69% BICA 03/2014.  Marland Kitchen Sinus bradycardia     Surgical History:  Past Surgical History  Procedure Laterality Date  . Coronary artery bypass graft  2005    LIMA to AL, SVG to LAD, SVG to Cx, SVG-PDA  . Carotid endarterectomy  2005  . Knee arthroscopy w/ acl reconstruction Left 2001  . Carpal tunnel with cubital tunnel Right 2001  . Coronary angioplasty with stent placement  2011    Promus Premier DES, 3.5 mm x 12 mm, for 99% proximal stent ISR in the SVG-RPDA   . Cardiac catheterization  Jan 2012    patent stents  in RCA graft; CAD stable  . Coronary angioplasty with stent placement  05/24/2014  . Appendectomy  ~ 1970  . Laparoscopic cholecystectomy  2010  . Vaginal hysterectomy  ~ 1993  . Tubal ligation  ~ 1989  . Left heart catheterization with coronary/graft angiogram N/A 05/24/2014    Procedure: LEFT HEART CATHETERIZATION WITH Isabel Caprice;  Surgeon: Marykay Lex, MD; EF 50-55%, LAD & D1 100%, LIMA-D1 OK, CFX OK, OM1 & OM2 100%, lat OM branch 100%, RCA 100%; pSVG-RPDA 99% rx w/ scoring balloon PTCA & 3.5 x 12 mm Promus DES; SVG-LAD, SVG-OM known occluded        I have reviewed the patient's current medications. Medication Sig  albuterol (PROAIR HFA) 108 (90 BASE) MCG/ACT inhaler Inhale 2 puffs into the lungs every 6 (six) hours as needed for wheezing or shortness of breath.  albuterol (PROVENTIL) (2.5 MG/3ML) 0.083% nebulizer solution Take 2.5 mg by nebulization every 6 (six) hours as  needed for wheezing or shortness of breath.  ALPRAZolam (XANAX) 1 MG tablet Take 1 mg by mouth 4 (four) times daily as needed for anxiety or sleep.   amLODipine (NORVASC) 10 MG tablet Take 1 tablet (10 mg total) by mouth daily.  aspirin 325 MG tablet Take 162.5 mg by mouth daily.   benazepril (LOTENSIN) 20 MG tablet TAKE (1) TABLET BY MOUTH EACH MORNING.  EFFIENT 10 MG TABS tablet TAKE (1) TABLET BY MOUTH EACH MORNING.  furosemide (LASIX) 80 MG tablet TAKE ONE TABLET DAILY. CALL OFFICE AND MAKE AN APPOINTMENT.  HYDROcodone-acetaminophen (NORCO) 10-325 MG per tablet Take 1 tablet by mouth every 4 (four) hours as needed for moderate pain (pain).   levothyroxine (SYNTHROID, LEVOTHROID) 75 MCG tablet Take 75 mcg by mouth daily before breakfast.   lovastatin (MEVACOR) 40 MG tablet TAKE (1) TABLET BY MOUTH AT BEDTIME.  methocarbamol (ROBAXIN) 500 MG tablet Take 1 tablet (500 mg total) by mouth 2 (two) times daily as needed for muscle spasms.  metoprolol succinate (TOPROL-XL) 50 MG 24 hr tablet TAKE (1) TABLET BY MOUTH EACH MORNING WITH OR IMMEDIATELY FOLLOWING AMEAL.  Multiple Vitamin (MULTIVITAMIN WITH MINERALS) TABS tablet Take 1 tablet by mouth 2 (two) times daily.  nitroGLYCERIN (NITROSTAT) 0.4 MG SL tablet Place 0.4 mg under the tongue every 5 (five) minutes as needed for chest pain. If no relief in 15 minutes call 911  omeprazole (PRILOSEC) 20 MG capsule TAKE ONE BY MOUTH CAPSULE DAILY.  potassium chloride SA (K-DUR,KLOR-CON) 20 MEQ tablet TAKE (1) TABLET BY MOUTH EACH MORNING.  zolpidem (AMBIEN) 5 MG tablet Take 5 mg by mouth at bedtime.   Allergies:  Allergies  Allergen Reactions  . Adhesive [Tape] Itching and Other (See Comments)    Blisters (pls use paper tape)  . Crestor [Rosuvastatin] Other (See Comments)    Muscle pain  . Lipitor [Atorvastatin] Other (See Comments)    Cramps  . Morphine And Related Itching and Nausea And Vomiting    Taken off per patient request. States she "can  take this"   Social History   Social History  . Marital Status: Legally Separated    Spouse Name: N/A  . Number of Children: 3  . Years of Education: N/A   Occupational History  . disabled    Social History Main Topics  . Smoking status: Former Smoker -- 0.50 packs/day for 21 years    Types: Cigarettes    Quit date: 01/03/2014, started back smoking a few cigarettes/day since husband moved back in.  Marland Kitchen  Smokeless tobacco: Never Used  . Alcohol Use: No  . Drug Use: Yes    Special: Cocaine, Marijuana     Comment: last used in 02/2004  . Sexual Activity: No   Other Topics Concern  . Not on file   Social History Narrative   Lives in [place], [place]. Husband currently present, will be moving in a few weeks.     Family History  Problem Relation Age of Onset  . Heart attack Mother   . Heart disease Mother   . Breast cancer Mother   . Heart attack Father   . Heart disease Father   . Colon cancer Neg Hx    Family Status  Relation Status Death Age  . Mother Deceased 1  . Father Deceased 40    Review of Systems:   Full 14-point review of systems otherwise negative except as noted above.  Physical Exam: Blood pressure 102/60, pulse 62, temperature 98.2 F (36.8 C), temperature source Oral, resp. rate 16, height 5\' 4"  (1.626 m), weight 239 lb (108.41 kg), SpO2 100 %. General: Well developed, well nourished,female in no acute distress. Head: Normocephalic, atraumatic, sclera non-icteric, no xanthomas, nares are without discharge. Dentition: poor Neck: L carotid bruit. JVD not elevated. No thyromegally Lungs: Good expansion bilaterally. without wheezes or rhonchi.  Heart: Regular rate and rhythm with S1 S2.  No S3 or S4.  No murmur, no rubs, or gallops appreciated. Abdomen: Soft, non-tender, non-distended with normoactive bowel sounds. No hepatomegaly. No rebound/guarding. No obvious abdominal masses. Msk:  Strength and tone appear normal for age. No joint deformities or effusions, no  spine or costo-vertebral angle tenderness. Extremities: No clubbing or cyanosis. No edema.  Distal pedal pulses are 2+ in 4 extrem Neuro: Alert and oriented X 3. Moves all extremities spontaneously. No focal deficits noted. Psych:  Responds to questions appropriately with a normal affect. Skin: No rashes or lesions noted  Labs:   Lab Results  Component Value Date   WBC 7.7 06/03/2015   HGB 11.6* 06/03/2015   HCT 35.0* 06/03/2015   MCV 92.6 06/03/2015   PLT 233 06/03/2015      Recent Labs Lab 06/03/15 1216  NA 136  K 5.9*  CL 103  CO2 26  BUN 15  CREATININE 0.98  CALCIUM 8.7*  GLUCOSE 89    Recent Labs  06/03/15 1223  TROPIPOC 0.01   Lab Results  Component Value Date   CHOL 196 05/29/2014   HDL 34* 05/29/2014   LDLCALC 93 05/29/2014   TRIG 343* 05/29/2014    Radiology/Studies: Dg Chest 2 View 06/03/2015   CLINICAL DATA:  Severe left-sided chest pain with difficulty breathing.  EXAM: CHEST  2 VIEW  COMPARISON:  05/28/2014  FINDINGS: There is no focal parenchymal opacity. There is no pleural effusion or pneumothorax. The heart and mediastinal contours are unremarkable. There is evidence of prior CABG.  The osseous structures are unremarkable.  IMPRESSION: No active cardiopulmonary disease.   Electronically Signed   By: Elige Ko   On: 06/03/2015 12:09     Cardiac Cath: 05/24/2014 Left Ventriculography:  EF: 50 at 55 %  Wall Motion: Severe hypokinesis of the Basal to MID inferior-inferolateraL wall with mild hypokinesis of the basal anterior wall Coronary Anatomy:  Dominance: Right  Left Main: Normal caliber vessel that itself is free of significant disease. Bifurcates into the LAD and Circumflex LAD: Moderate caliber vessel that is 100% occluded after a septal perforator and small diagonal branch. There is a  first diagonal branch that is also half percent occluded after initial subtotal occlusion. Left Circumflex: Moderate caliber vessel it gives rise to a  proximal small caliber OM branch that is somewhat tortuous and free of significant disease. The vessel then bifurcates into the introducer complex which is small in caliber and perfuses the basal inferolateral wall. There is a major lateral OM branch the visualized the proximal segment is occluded. There is late retrograde filling of the remainder the nature OM which appears to have 2 branches distally. This is faint collaterals from the proximal OM.  RCA: 100% proximal occluded. SVG-RPDA: Large-caliber graft with mildly irregular is proximally, there are overlapping stents in the distal mid graft with 99% napkin ring proximal in-stent restenosis of the most proximal stent that extends just minimally to the unstented segment. The remainder of the graft is relatively free of disease and attached to the mid RPDA.  RPDA: Large-caliber tortuous vessel that reaches almost to the apex.  RPL Sysytem:The RPAV fills via retrograde flow in the RPDA. There are several small posterolateral branches that are relatively free of disease. The PAV is relatively free of disease.  S/p Promus Premier DES 3.5 mm x 12 mm; overlaps roughly 4 mm into the original stent. POST-OPERATIVE DIAGNOSIS:   Severe native CAD with 100% negative RCA, LAD & D1 along with OM 1 and OM 2 as previously described  Severe proximal edge in-stent restenosis of SVG-RPDA   Successful PCI of the proximal edge in-stent restenosis of SVG RCA with scoring balloon angioplasty followed by DES stent placement: Promus Premier 3.5 mm x 12 mm postdilated to 4.1 mm  Widely patent LIMA-diagonal with distal collaterals to what appears to be the apical LAD.  Known occlusion of the SVG-LAD and SVG-OM  Relatively preserved LVEF as noted on the cardiogram recently. There is known hypokinesis to akinesis of the nasal to mid inferior inferolateral wall as well as mild hypokinesis of the basal anterior wall assistant with known CAD.  Echo: 04/02/2014 -  Left ventricle: The cavity size was normal. Wall thickness was increased in a pattern of mild LVH. Systolic function was normal. The estimated ejection fraction was in the range of 50% to 55%. There is akinesis of the basalinferolateral myocardium. Features are consistent with a pseudonormal left ventricular filling pattern, with concomitant abnormal relaxation and increased filling pressure (grade 2 diastolic dysfunction). - Aortic valve: Mildly calcified annulus. Trileaflet. There was no significant regurgitation. - Mitral valve: There was trivial regurgitation. - Left atrium: The atrium was mildly to moderately dilated. - Right ventricle: Systolic function was mildly reduced. - Right atrium: Central venous pressure (est): 3 mm Hg. - Atrial septum: No defect or patent foramen ovale was identified. - Tricuspid valve: There was trivial regurgitation. - Pulmonary arteries: Systolic pressure could not be accurately estimated. - Pericardium, extracardiac: There was no pericardial effusion. Impressions: - Mild LVH with LVEF 50-55%, basal inferolateral hypokinesis, grade 2 diastolic dysfunction. Mild to moderate left atrial enlargement. Mildly reduced RV contraction. Unable to assess PASP.  ECG: 06/03/2015 S brady, no acute changes.  ASSESSMENT AND PLAN:  Principal Problem:   Unstable angina - admit, r/o MI - add heparin - cath today, cath lab says they can do. - add nitrates if further pain  Active Problems:   Hyperlipidemia with target LDL less than 70 - ck LFTs and lipid profile in am    Essential hypertension  - BP low for 1 reading, otherwise, normal - follow, no med changes  Bradycardia - sinus, HR 40s at times at rest - on Toprol XL 50 mg qd - follow on this.    Hyperkalemia - K+ 5.9 but had some hemolysis - recheck pending - ER says will treat if still high.  [name] 06/03/2015 2:16 PM Beeper [phone]   The  patient was seen, examined and discussed with Theodore Demark, PA-C and I agree with the above.   A very pleasant 59 year old female with h/o CAD, CABG in 2005, the last cath in 2015 with Severe native CAD with 100% negative RCA, LAD & D1 along with OM 1 and OM 2, Severe proximal edge in-stent restenosis of SVG-RPDA and Successful PCI of the proximal edge in-stent restenosis of SVG RCA with scoring balloon angioplasty followed by DES stent placement: Promus Premier 3.5 mm x 12 mm postdilated to 4.1 mm, Widely patent LIMA-diagonal with distal collaterals to what appears to be the apical LAD. Known occlusion of the SVG-LAD and SVG-OM.  The patient has been through a lot lately (death of the best friend, she started to smoke again) and presented with chest pain typical for her. She is still symptomatic with chest pain. Her ECG is unchanged from prior. Troponin is negative x 1, Crea is normal, we will schedule for a left cardiac cath today.  We will continue ASA, lovastatin, lotensin, metoprolol.   We will start Heparin drip, unable to use NTG as she is rather hypotensive.  Lars Masson 06/03/2015

## 2015-06-03 NOTE — Interval H&P Note (Signed)
Cath Lab Visit (complete for each Cath Lab visit)  Clinical Evaluation Leading to the Procedure:   ACS: Yes.    Non-ACS:    Anginal Classification: CCS IV  Anti-ischemic medical therapy: Maximal Therapy (2 or more classes of medications)  Non-Invasive Test Results: No non-invasive testing performed  Prior CABG: Previous CABG      History and Physical Interval Note:  06/03/2015 5:38 PM  [name]  has presented today for surgery, with the diagnosis of cp  The various methods of treatment have been discussed with the patient and family. After consideration of risks, benefits and other options for treatment, the patient has consented to  Procedure(s): Left Heart Cath and Cors/Grafts Angiography (N/A) as a surgical intervention .  The patient's history has been reviewed, patient examined, no change in status, stable for surgery.  I have reviewed the patient's chart and labs.  Questions were answered to the patient's satisfaction.     Lesleigh Noe

## 2015-06-03 NOTE — ED Provider Notes (Signed)
CSN: [id number]     Arrival date & time 06/03/15  1118 History   First MD Initiated Contact with Patient 06/03/15 1132     Chief Complaint  Patient presents with  . Chest Pain     (Consider location/radiation/quality/duration/timing/severity/associated sxs/prior Treatment) HPI NEIL ERRICKSON This 59 year old female with a past medical history of coronary artery disease, status post MI in 2005 and 2009. Last cardiac catheterization one year ago which showed severe native coronary artery disease with 100% occlusion of the proximal ICA. She had successful balloon angioplasty with stent placement. Patient also had other areas of stenosis. The patient complains of left-sided chest pain. She states over the past few days. She's had intermittent, sharp pains on the left side. This seemed to come and go at any time. Nonexertional. Patient states today she was sitting on the couch, striking her coughing taking her morning medication when it became severe. She states that she has associated pressure and shortness of breath in a car. They last anywhere from 5-10 seconds. She continues to feel pressure on the left side. She denies any radiation into the left jaw or left arm, nausea, diaphoresis. She states that this feels the same as how her previous MIs started. She did take aspirin and nitroglycerin at home and on EMS without any relief of her symptoms. Her pain feels better with slow deep breathing. She denies any unilateral leg swelling, history of DVTs, hemoptysis or cough. The patient is currently smoking. Her cardiologist is Dr. Wyline Mood Past Medical History  Diagnosis Date  . Coronary artery disease     a. s/p prior CABG 2005. b. BMS 02/2009 to VG-PDA. c. NSTEMI 08/2010 s/p PTCA/DES to SVG-PDA in previously stented area. d. DES to SVG to RCA on 05/24/14  . Hypertension   . Hyperlipidemia   . GERD (gastroesophageal reflux disease)   . Diverticulitis     4 documented episodes by CT; most recent April  2012  . Depression   . Anxiety   . Hypothyroidism   . MI (myocardial infarction) 2005; 2009; 08/2010 X 2  . COPD (chronic obstructive pulmonary disease)   . Asthma   . Sleep apnea     a. Mild-to-moderate obstructive sleep apnea diagnosed in April 2012 by sleep study.  . Anemia     "related to chole OR"  . History of blood transfusion     "several times; w/chole; pneumonia, diverticulitis"  . Tension headache   . Seizures     "stress related"  . Arthritis   . Chronic back pain   . DDD (degenerative disc disease)   . Pneumonia 04/2011  . Kidney stones   . Acute renal failure ~ 04/2011    "when I had pneumonia"  . Ischemic cardiomyopathy     a. EF 40% in 2012 by cath. b. improved to 50-55% by cath 05/2014   . Carotid artery disease     a. Duplex 50-69% BICA 03/2014.  Marland Kitchen Sinus bradycardia    Past Surgical History  Procedure Laterality Date  . Coronary artery bypass graft  2005    LIMA to AL, SVG to LAD, SVG to Cx, SVG-PDA  . Carotid endarterectomy  2005  . Knee arthroscopy w/ acl reconstruction Left 2001  . Carpal tunnel with cubital tunnel Right 2001  . Coronary angioplasty with stent placement  2011    Promus Premier DES, 3.5 mm x 12 mm, for 99% proximal stent ISR in the SVG-RPDA   . Cardiac catheterization  Jan  2012    patent stents in RCA graft; CAD stable  . Coronary angioplasty with stent placement  05/24/2014  . Appendectomy  ~ 1970  . Laparoscopic cholecystectomy  2010  . Vaginal hysterectomy  ~ 1993  . Tubal ligation  ~ 1989  . Left heart catheterization with coronary/graft angiogram N/A 05/24/2014    Procedure: LEFT HEART CATHETERIZATION WITH Isabel Caprice;  Surgeon: Marykay Lex, MD;  Location: [facility] CATH LAB;  Service: Cardiovascular;  Laterality: N/A;   Family History  Problem Relation Age of Onset  . Heart attack Mother   . Heart disease Mother   . Breast cancer Mother   . Heart attack Father   . Heart disease Father   . Colon cancer Neg Hx     Social History  Substance Use Topics  . Smoking status: Former Smoker -- 0.50 packs/day for 21 years    Types: Cigarettes    Quit date: 01/03/2014  . Smokeless tobacco: Never Used  . Alcohol Use: No   OB History    No data available     Review of Systems  Ten systems reviewed and are negative for acute change, except as noted in the HPI.    Allergies  Adhesive; Crestor; Lipitor; and Morphine and related  Home Medications   Prior to Admission medications   Medication Sig Start Date End Date Taking? Authorizing Provider  albuterol (PROAIR HFA) 108 (90 BASE) MCG/ACT inhaler Inhale 2 puffs into the lungs every 6 (six) hours as needed for wheezing or shortness of breath.    Historical Provider, MD  albuterol (PROVENTIL) (2.5 MG/3ML) 0.083% nebulizer solution Take 2.5 mg by nebulization every 6 (six) hours as needed for wheezing or shortness of breath.    Historical Provider, MD  ALPRAZolam Prudy Feeler) 1 MG tablet Take 1 mg by mouth 4 (four) times daily as needed for anxiety or sleep.     Historical Provider, MD  amLODipine (NORVASC) 10 MG tablet Take 1 tablet (10 mg total) by mouth daily. 11/15/14   Jodelle Gross, NP  aspirin 325 MG tablet Take 162.5 mg by mouth daily.     Historical Provider, MD  benazepril (LOTENSIN) 20 MG tablet TAKE (1) TABLET BY MOUTH EACH MORNING. 03/20/15   Jodelle Gross, NP  EFFIENT 10 MG TABS tablet TAKE (1) TABLET BY MOUTH EACH MORNING. 05/16/15   Antoine Poche, MD  furosemide (LASIX) 80 MG tablet TAKE ONE TABLET DAILY. CALL OFFICE AND MAKE AN APPOINTMENT. 05/28/15   Antoine Poche, MD  HYDROcodone-acetaminophen [facility]) 10-325 MG per tablet Take 1 tablet by mouth every 4 (four) hours as needed for moderate pain (pain).     Historical Provider, MD  levothyroxine (SYNTHROID, LEVOTHROID) 75 MCG tablet Take 75 mcg by mouth daily before breakfast.     Historical Provider, MD  lovastatin (MEVACOR) 40 MG tablet TAKE (1) TABLET BY MOUTH AT BEDTIME. 11/23/14    Antoine Poche, MD  methocarbamol (ROBAXIN) 500 MG tablet Take 1 tablet (500 mg total) by mouth 2 (two) times daily as needed for muscle spasms. 02/05/15   Raeford Razor, MD  metoprolol succinate (TOPROL-XL) 50 MG 24 hr tablet TAKE (1) TABLET BY MOUTH EACH MORNING WITH OR IMMEDIATELY FOLLOWING AMEAL. 03/26/15   Jodelle Gross, NP  Multiple Vitamin (MULTIVITAMIN WITH MINERALS) TABS tablet Take 1 tablet by mouth 2 (two) times daily. 08/28/13   Antoine Poche, MD  nitroGLYCERIN (NITROSTAT) 0.4 MG SL tablet Place 0.4 mg under the tongue every  5 (five) minutes as needed for chest pain. If no relief in 15 minutes call 911    Historical Provider, MD  omeprazole (PRILOSEC) 20 MG capsule TAKE ONE BY MOUTH CAPSULE DAILY. 05/09/15   Jodelle Gross, NP  potassium chloride SA (K-DUR,KLOR-CON) 20 MEQ tablet TAKE (1) TABLET BY MOUTH EACH MORNING. 01/09/15   Jodelle Gross, NP  zolpidem (AMBIEN) 5 MG tablet Take 5 mg by mouth at bedtime.    Historical Provider, MD   BP 104/56 mmHg  Pulse 53  Temp(Src) 98.2 F (36.8 C) (Oral)  Resp 11  Ht  (1.626 m)  Wt 239 lb (108.41 kg)  BMI 41.00 kg/m2  SpO2 99% Physical Exam  Constitutional: She is oriented to person, place, and time. She appears well-developed and well-nourished. No distress.  HENT:  Head: Normocephalic and atraumatic.  Eyes: Conjunctivae are normal. No scleral icterus.  Neck: Normal range of motion.  Cardiovascular: Normal rate, regular rhythm and normal heart sounds.  Exam reveals no gallop and no friction rub.   No murmur heard. Pulmonary/Chest: Effort normal and breath sounds normal. No respiratory distress. She exhibits no tenderness.  Abdominal: Soft. Bowel sounds are normal. She exhibits no distension and no mass. There is no tenderness. There is no guarding.  Musculoskeletal: Normal range of motion.  Neurological: She is alert and oriented to person, place, and time.  Skin: Skin is warm and dry. She is not diaphoretic.   Nursing note and vitals reviewed.   ED Course  Procedures (including critical care time) Labs Review Labs Reviewed  BASIC METABOLIC PANEL - Abnormal; Notable for the following:    Potassium 5.9 (*)    Calcium 8.7 (*)    All other components within normal limits  CBC - Abnormal; Notable for the following:    RBC 3.78 (*)    Hemoglobin 11.6 (*)    HCT 35.0 (*)    All other components within normal limits  I-STAT TROPOININ, ED    Imaging Review Dg Chest 2 View  06/03/2015   CLINICAL DATA:  Severe left-sided chest pain with difficulty breathing.  EXAM: CHEST  2 VIEW  COMPARISON:  05/28/2014  FINDINGS: There is no focal parenchymal opacity. There is no pleural effusion or pneumothorax. The heart and mediastinal contours are unremarkable. There is evidence of prior CABG.  The osseous structures are unremarkable.  IMPRESSION: No active cardiopulmonary disease.   Electronically Signed   By: Elige Ko   On: 06/03/2015 12:09   I have personally reviewed and evaluated these images and lab results as part of my medical decision-making.   EKG Interpretation None       ED ECG REPORT   Date: 06/03/2015  Rate: 56  Rhythm: normal sinus rhythm  QRS Axis: normal  Intervals: normal  ST/T Wave abnormalities: nonspecific T wave changes  Conduction Disutrbances:none  Narrative Interpretation:   Old EKG Reviewed: unchanged  I have personally reviewed the EKG tracing and agree with the computerized printout as noted.   MDM   Final diagnoses:  None    1:15 PM BP 103/59 mmHg  Pulse 56  Temp(Src) 98.2 F (36.8 C) (Oral)  Resp 9  Ht  (1.626 m)  Wt 239 lb (108.41 kg)  BMI 41.00 kg/m2  SpO17 27% 59 year old female with a past medical history of coronary artery disease and previous MI here with chest pain, pressure, unrelieved with nitroglycerin. After given the patient fentanyl, also her pressures are soft. Patient will be a  cardiology consult. We'll continue to trend her  troponins here in the emergency department. She is stable at this time. Given her history, I have concern for acute coronary syndrome, although her present illness and symptoms lasting 5-10 seconds seem less likely cardiac in origin. Her initial EKG is done concerning for acute ischemia. She has a negative troponin. BMP shows a potassium of 5.9, however, this may be hemolyzed and will be repeated. She has mild anemia. However, this appears to be her baseline.   1:38 PM Patient repeat potassium 6.1, however the istat was drawn off of the old blood sample. I spoke with Tollie Pizza in the lab and he will repeat the Chem 8 from a fresh sample   ] 1:57 PM Repeat chem 8 shows potassium of 4.4  Patient seen by cardiology. Will be admitted by the cardiology service for further work up.   Arthor Captain, PA-C 06/03/15 1357  Arthor Captain, PA-C 06/03/15 1358  Marily Memos, MD 06/03/15 1719

## 2015-06-03 NOTE — ED Notes (Signed)
Pt had a sudden onset of crushing chest pain that started this morning while just sitting at home. Pt states that the pain comes and goes and while at home pain was radating into her left jaw. At this time pt has 2/10 chest pressure in left side of chest. Pt states this am she was , "very sweaty". Pt is warm and dry. Pt states pain is coming is waves like cramps in her chest.

## 2015-06-03 NOTE — Progress Notes (Signed)
ANTICOAGULATION CONSULT NOTE  Pharmacy Consult for heparin Indication: chest pain/ACS  Allergies  Allergen Reactions  . Adhesive [Tape] Itching and Other (See Comments)    Blisters (pls use paper tape)  . Crestor [Rosuvastatin] Other (See Comments)    Muscle pain  . Lipitor [Atorvastatin] Other (See Comments)    Cramps  . Morphine And Related Itching and Nausea And Vomiting    Taken off per patient request. States she "can take this"    Patient Measurements: Height: 5\' 4"  (162.6 cm) Weight: 234 lb (106.142 kg) IBW/kg (Calculated) : 54.7 Heparin Dosing Weight: 80 kg  Vital Signs: Temp: 98 F (36.7 C) (08/29 1543) Temp Source: Oral (08/29 1124) BP: 104/57 mmHg (08/29 1543) Pulse Rate: 53 (08/29 1445)  Labs:  Recent Labs  06/03/15 1216 06/03/15 1316 06/03/15 1334 06/03/15 1353  HGB 11.6*  --  12.2 12.2  HCT 35.0*  --  36.0 36.0  PLT 233  --   --   --   CREATININE 0.98  --  1.00 0.90  TROPONINI  --  <0.03  --   --     Estimated Creatinine Clearance: 80 mL/min (by C-G formula based on Cr of 0.9).   Medical History: Past Medical History  Diagnosis Date  . Coronary artery disease     a. s/p prior CABG 2005. b. BMS 02/2009 to VG-PDA. c. NSTEMI 08/2010 s/p PTCA/DES to SVG-PDA in previously stented area. d. DES to SVG to RCA on 05/24/14  . Hypertension   . Hyperlipidemia   . GERD (gastroesophageal reflux disease)   . Diverticulitis     4 documented episodes by CT; most recent April 2012  . Depression   . Anxiety   . Hypothyroidism   . MI (myocardial infarction) 2005; 2009; 08/2010 X 2  . COPD (chronic obstructive pulmonary disease)   . Asthma   . Sleep apnea     a. Mild-to-moderate obstructive sleep apnea diagnosed in April 2012 by sleep study.  . Anemia     "related to chole OR"  . History of blood transfusion     "several times; w/chole; pneumonia, diverticulitis"  . Tension headache   . Seizures     "stress related"  . Arthritis   . Chronic back pain    . DDD (degenerative disc disease)   . Pneumonia 04/2011  . Kidney stones   . Acute renal failure ~ 04/2011    "when I had pneumonia"  . Ischemic cardiomyopathy     a. EF 40% in 2012 by cath. b. improved to 50-55% by cath 05/2014   . Carotid artery disease     a. Duplex 50-69% BICA 03/2014.  Marland Kitchen Sinus bradycardia     Medications:  Prescriptions prior to admission  Medication Sig Dispense Refill Last Dose  . albuterol (PROAIR HFA) 108 (90 BASE) MCG/ACT inhaler Inhale 2 puffs into the lungs every 6 (six) hours as needed for wheezing or shortness of breath.   06/02/2015 at Unknown time  . albuterol (PROVENTIL) (2.5 MG/3ML) 0.083% nebulizer solution Take 2.5 mg by nebulization every 6 (six) hours as needed for wheezing or shortness of breath.   Past Week at Unknown time  . ALPRAZolam (XANAX) 1 MG tablet Take 1 mg by mouth 4 (four) times daily as needed for anxiety or sleep.    06/03/2015 at Unknown time  . amLODipine (NORVASC) 10 MG tablet Take 1 tablet (10 mg total) by mouth daily. 90 tablet 2 06/03/2015 at Unknown time  . aspirin  325 MG tablet Take 325 mg by mouth daily.    06/03/2015 at Unknown time  . benazepril (LOTENSIN) 20 MG tablet TAKE (1) TABLET BY MOUTH EACH MORNING. 30 tablet 6 06/03/2015 at Unknown time  . doxepin (SINEQUAN) 25 MG capsule Take 25 mg by mouth at bedtime.   06/02/2015 at Unknown time  . EFFIENT 10 MG TABS tablet TAKE (1) TABLET BY MOUTH EACH MORNING. 30 tablet 0 06/03/2015 at Unknown time  . furosemide (LASIX) 80 MG tablet TAKE ONE TABLET DAILY. CALL OFFICE AND MAKE AN APPOINTMENT. 30 tablet 0 Past Week at Unknown time  . HYDROcodone-acetaminophen (NORCO) 10-325 MG per tablet Take 1 tablet by mouth every 4 (four) hours as needed for moderate pain (pain).    06/02/2015 at Unknown time  . levothyroxine (SYNTHROID, LEVOTHROID) 75 MCG tablet Take 75 mcg by mouth daily before breakfast.    06/03/2015 at Unknown time  . lovastatin (MEVACOR) 40 MG tablet TAKE (1) TABLET BY MOUTH AT  BEDTIME. 30 tablet 6 06/02/2015 at Unknown time  . metoprolol succinate (TOPROL-XL) 50 MG 24 hr tablet TAKE (1) TABLET BY MOUTH EACH MORNING WITH OR IMMEDIATELY FOLLOWING AMEAL. 30 tablet 6 06/03/2015 at 0800  . Multiple Vitamin (MULTIVITAMIN WITH MINERALS) TABS tablet Take 1 tablet by mouth 2 (two) times daily. 25 tablet 4 06/02/2015 at Unknown time  . nitroGLYCERIN (NITROSTAT) 0.4 MG SL tablet Place 0.4 mg under the tongue every 5 (five) minutes as needed for chest pain. If no relief in 15 minutes call 911   06/03/2015 at unk  . omeprazole (PRILOSEC) 20 MG capsule TAKE ONE BY MOUTH CAPSULE DAILY. 30 capsule 6 06/03/2015 at Unknown time  . potassium chloride SA (K-DUR,KLOR-CON) 20 MEQ tablet TAKE (1) TABLET BY MOUTH EACH MORNING. (Patient taking differently: TAKE (1) TABLET BY MOUTH EACH MORNING. pt only takes with lasix prn) 30 tablet 11 Past Week at Unknown time  . methocarbamol (ROBAXIN) 500 MG tablet Take 1 tablet (500 mg total) by mouth 2 (two) times daily as needed for muscle spasms. (Patient not taking: Reported on 06/03/2015) 10 tablet 0 Not Taking at Unknown time    Assessment: 59 yo female with hx of CABG 2005, DES in 2015, admitted with intermittent crushing chest pain. Initiating heparin gtt for rule out ACS. CBC stable. Pt had ASA 325 mg pta.   Goal of Therapy:  Heparin level 0.3-0.7 units/ml Monitor platelets by anticoagulation protocol: Yes   Plan:  -Heparin 4000 units x1 then 950 units/hr -Daily HL, CBC -Monitor s/sx bleeding -Check confirmatory level this evening -F/u plan for cath, possibly this evening    Agapito Games, PharmD, BCPS Clinical Pharmacist Pager: [phone] 06/03/2015 3:54 PM

## 2015-06-04 ENCOUNTER — Encounter: Payer: Self-pay | Admitting: Orthopedic Surgery

## 2015-06-04 ENCOUNTER — Ambulatory Visit: Payer: Medicaid Other | Admitting: Orthopedic Surgery

## 2015-06-04 DIAGNOSIS — F411 Generalized anxiety disorder: Secondary | ICD-10-CM

## 2015-06-04 DIAGNOSIS — Z9861 Coronary angioplasty status: Secondary | ICD-10-CM

## 2015-06-04 DIAGNOSIS — I10 Essential (primary) hypertension: Secondary | ICD-10-CM | POA: Diagnosis not present

## 2015-06-04 DIAGNOSIS — I2510 Atherosclerotic heart disease of native coronary artery without angina pectoris: Secondary | ICD-10-CM

## 2015-06-04 DIAGNOSIS — I200 Unstable angina: Secondary | ICD-10-CM | POA: Diagnosis not present

## 2015-06-04 DIAGNOSIS — Z951 Presence of aortocoronary bypass graft: Secondary | ICD-10-CM

## 2015-06-04 DIAGNOSIS — E785 Hyperlipidemia, unspecified: Secondary | ICD-10-CM

## 2015-06-04 DIAGNOSIS — E669 Obesity, unspecified: Secondary | ICD-10-CM

## 2015-06-04 LAB — POCT I-STAT, CHEM 8
BUN: 25 mg/dL — AB (ref 6–20)
CHLORIDE: 103 mmol/L (ref 101–111)
Calcium, Ion: 1.05 mmol/L — ABNORMAL LOW (ref 1.12–1.23)
Creatinine, Ser: 1 mg/dL (ref 0.44–1.00)
Glucose, Bld: 77 mg/dL (ref 65–99)
HEMATOCRIT: 36 % (ref 36.0–46.0)
Hemoglobin: 12.2 g/dL (ref 12.0–15.0)
Potassium: 6.1 mmol/L (ref 3.5–5.1)
SODIUM: 135 mmol/L (ref 135–145)
TCO2: 26 mmol/L (ref 0–100)

## 2015-06-04 LAB — LIPID PANEL
CHOL/HDL RATIO: 6.3 ratio
Cholesterol: 196 mg/dL (ref 0–200)
HDL: 31 mg/dL — AB (ref >40)
LDL CALC: 101 mg/dL — AB (ref 0–99)
Triglycerides: 319 mg/dL — ABNORMAL HIGH (ref <150)
VLDL: 64 mg/dL — AB (ref 0–40)

## 2015-06-04 LAB — COMPREHENSIVE METABOLIC PANEL
ALT: 19 U/L (ref 14–54)
ANION GAP: 5 (ref 5–15)
AST: 19 U/L (ref 15–41)
Albumin: 3.5 g/dL (ref 3.5–5.0)
Alkaline Phosphatase: 69 U/L (ref 38–126)
BUN: 17 mg/dL (ref 6–20)
CHLORIDE: 102 mmol/L (ref 101–111)
CO2: 29 mmol/L (ref 22–32)
Calcium: 8.6 mg/dL — ABNORMAL LOW (ref 8.9–10.3)
Creatinine, Ser: 0.93 mg/dL (ref 0.44–1.00)
Glucose, Bld: 97 mg/dL (ref 65–99)
POTASSIUM: 4.6 mmol/L (ref 3.5–5.1)
SODIUM: 136 mmol/L (ref 135–145)
Total Bilirubin: 0.5 mg/dL (ref 0.3–1.2)
Total Protein: 6.1 g/dL — ABNORMAL LOW (ref 6.5–8.1)

## 2015-06-04 LAB — TROPONIN I

## 2015-06-04 MED ORDER — EZETIMIBE 10 MG PO TABS
10.0000 mg | ORAL_TABLET | Freq: Every day | ORAL | Status: DC
  Administered 2015-06-04 – 2015-06-05 (×2): 10 mg via ORAL
  Filled 2015-06-04 (×2): qty 1

## 2015-06-04 MED ORDER — ISOSORBIDE MONONITRATE ER 30 MG PO TB24
30.0000 mg | ORAL_TABLET | Freq: Every day | ORAL | Status: DC
  Administered 2015-06-04: 30 mg via ORAL
  Filled 2015-06-04: qty 1

## 2015-06-04 MED ORDER — ASPIRIN 81 MG PO CHEW
81.0000 mg | CHEWABLE_TABLET | Freq: Every day | ORAL | Status: DC
  Administered 2015-06-05: 81 mg via ORAL
  Filled 2015-06-04: qty 1

## 2015-06-04 MED ORDER — DIPHENHYDRAMINE HCL 50 MG/ML IJ SOLN
25.0000 mg | Freq: Once | INTRAMUSCULAR | Status: AC
  Administered 2015-06-05: 25 mg via INTRAVENOUS
  Filled 2015-06-04: qty 1

## 2015-06-04 MED ORDER — MORPHINE SULFATE (PF) 2 MG/ML IV SOLN
1.0000 mg | Freq: Once | INTRAVENOUS | Status: AC
  Administered 2015-06-05: 1 mg via INTRAVENOUS
  Filled 2015-06-04: qty 1

## 2015-06-04 MED ORDER — OMEGA-3-ACID ETHYL ESTERS 1 G PO CAPS
2.0000 g | ORAL_CAPSULE | Freq: Two times a day (BID) | ORAL | Status: DC
  Administered 2015-06-04 – 2015-06-05 (×2): 2 g via ORAL
  Filled 2015-06-04 (×2): qty 2

## 2015-06-04 MED ORDER — PRAVASTATIN SODIUM 40 MG PO TABS
40.0000 mg | ORAL_TABLET | Freq: Every day | ORAL | Status: DC
  Administered 2015-06-04: 40 mg via ORAL
  Filled 2015-06-04: qty 1

## 2015-06-04 NOTE — Progress Notes (Signed)
Subjective:  No chest pain overnight  Objective:  Vital Signs in the last 24 hours: Temp:  [97.7 F (36.5 C)-98.3 F (36.8 C)] 97.7 F (36.5 C) (08/30 0511) Pulse Rate:  [0-82] 59 (08/29 2227) Resp:  [0-60] 14 (08/30 0511) BP: (94-142)/(38-89) 101/45 mmHg (08/30 0511) SpO2:  [0 %-100 %] 96 % (08/30 0511) Weight:  [234 lb (106.142 kg)-239 lb (108.41 kg)] 236 lb (107.049 kg) (08/30 0532)  Intake/Output from previous day:  Intake/Output Summary (Last 24 hours) at 06/04/15 1013 Last data filed at 06/04/15 0831  Gross per 24 hour  Intake   1292 ml  Output      0 ml  Net   1292 ml    Physical Exam: General appearance: alert, cooperative, no distress and moderately obese Neck: no JVD and L>R bruit Lungs: clear to auscultation bilaterally Heart: regular rate and rhythm Abdomen: obese Extremities: Rt groin without hematoma Skin: cool, pale, dry Neurologic: Grossly normal   Rate: 58  Rhythm: normal sinus rhythm, sinus bradycardia and premature atrial contractions (PAC)  Lab Results:  Recent Labs  06/03/15 1216 06/03/15 1334 06/03/15 1353  WBC 7.7  --   --   HGB 11.6* 12.2 12.2  PLT 233  --   --     Recent Labs  06/03/15 1216  06/03/15 1353 06/04/15 0205  NA 136  < > 138 136  K 5.9*  < > 4.4 4.6  CL 103  < > 103 102  CO2 26  --   --  29  GLUCOSE 89  < > 92 97  BUN 15  < > 17 17  CREATININE 0.98  < > 0.90 0.93  < > = values in this interval not displayed.  Recent Labs  06/03/15 2008 06/04/15 0205  TROPONINI <0.03 <0.03    Recent Labs  06/03/15 1641  INR 1.35   Lipid Panel     Component Value Date/Time   CHOL 196 06/04/2015 0205   TRIG 319* 06/04/2015 0205   HDL 31* 06/04/2015 0205   CHOLHDL 6.3 06/04/2015 0205   VLDL 64* 06/04/2015 0205   LDLCALC 101* 06/04/2015 0205         Scheduled Meds: . amLODipine  10 mg Oral Daily  . aspirin  325 mg Oral Daily  . benazepril  20 mg Oral Daily  . doxepin  25 mg Oral QHS  . levothyroxine  75  mcg Oral QAC breakfast  . metoprolol succinate  50 mg Oral Daily  . multivitamin with minerals  1 tablet Oral BID  . pantoprazole  40 mg Oral Daily  . prasugrel  10 mg Oral Daily  . pravastatin  20 mg Oral q1800  . sodium chloride  3 mL Intravenous Q12H   Continuous Infusions:  PRN Meds:.sodium chloride, acetaminophen, albuterol, ALPRAZolam, HYDROcodone-acetaminophen, nitroGLYCERIN, ondansetron (ZOFRAN) IV, sodium chloride, zolpidem   Imaging: Dg Chest 2 View  06/03/2015   CLINICAL DATA:  Severe left-sided chest pain with difficulty breathing.  EXAM: CHEST  2 VIEW  COMPARISON:  05/28/2014  FINDINGS: There is no focal parenchymal opacity. There is no pleural effusion or pneumothorax. The heart and mediastinal contours are unremarkable. There is evidence of prior CABG.  The osseous structures are unremarkable.  IMPRESSION: No active cardiopulmonary disease.   Electronically Signed   By: Elige Ko   On: 06/03/2015 12:09     Assessment/Plan:  Pt is an obese 59 y.o. female with a history of CABG 2005, and s/p multiple PCI's  since. Her last intervention was a DES to pSVG-RPDA 2015. She has know mod carotid dz, HTN, HL, GERD, hypothyroid, anemia, anxiety, and priorICM w/ EF 50-55% in 03/2014. She was admitted with chest pain 06/03/15. She ruled out for an MI. Cath done 06/03/15 revealed no significant change in her anatomy and the plan is for continued medical Rx.   Principal Problem:   Unstable angina Active Problems:   Essential hypertension   Hx of CABG 2005   CAD S/P multiple PCI after CABG   Hyperlipidemia with target LDL less than 70   Anxiety state   PVD- 50-69% bilat ICA    Obesity-BMI 41   PLAN: Pt says Dr Katrinka Blazing told her no change, continue medical Rx. Possible home later today. Decrease ASA to 81 mg daily as she is on Effient. Consider Imdur or Ranexa.   Corine Shelter PA-C 06/04/2015, 10:13 AM [phone]  Patient seen and examined. Agree with assessment and plan. Long  discussion with patient.  Will keep today. Add imdur 30 mg to regimen. LDL 101; TG 319, HDL is 31.  Will change to pravastain 40 mg, add zetia 10 mg, add fish oil 2 gm bid.  Pt is grieving over loss of her close friend for 47 yrs;  Inceased stress. Keep today; aim for dc tomorrow.   Lennette Bihari, MD, [facility] 06/04/2015 2:53 PM

## 2015-06-05 ENCOUNTER — Encounter (HOSPITAL_COMMUNITY): Payer: Self-pay | Admitting: Interventional Cardiology

## 2015-06-05 DIAGNOSIS — I2510 Atherosclerotic heart disease of native coronary artery without angina pectoris: Secondary | ICD-10-CM | POA: Diagnosis not present

## 2015-06-05 DIAGNOSIS — E039 Hypothyroidism, unspecified: Secondary | ICD-10-CM | POA: Diagnosis present

## 2015-06-05 DIAGNOSIS — I10 Essential (primary) hypertension: Secondary | ICD-10-CM | POA: Diagnosis not present

## 2015-06-05 DIAGNOSIS — I255 Ischemic cardiomyopathy: Secondary | ICD-10-CM | POA: Diagnosis present

## 2015-06-05 DIAGNOSIS — I200 Unstable angina: Secondary | ICD-10-CM | POA: Diagnosis not present

## 2015-06-05 DIAGNOSIS — F411 Generalized anxiety disorder: Secondary | ICD-10-CM | POA: Diagnosis not present

## 2015-06-05 DIAGNOSIS — I739 Peripheral vascular disease, unspecified: Secondary | ICD-10-CM

## 2015-06-05 MED ORDER — MORPHINE SULFATE (PF) 2 MG/ML IV SOLN
2.0000 mg | Freq: Once | INTRAVENOUS | Status: AC
  Administered 2015-06-05: 2 mg via INTRAVENOUS
  Filled 2015-06-05: qty 1

## 2015-06-05 MED ORDER — DIPHENHYDRAMINE HCL 50 MG/ML IJ SOLN
25.0000 mg | Freq: Once | INTRAMUSCULAR | Status: AC
  Administered 2015-06-05: 25 mg via INTRAVENOUS
  Filled 2015-06-05: qty 1

## 2015-06-05 MED ORDER — RANOLAZINE ER 500 MG PO TB12
500.0000 mg | ORAL_TABLET | Freq: Two times a day (BID) | ORAL | Status: DC
  Administered 2015-06-05: 500 mg via ORAL
  Filled 2015-06-05: qty 1

## 2015-06-05 MED ORDER — PRAVASTATIN SODIUM 40 MG PO TABS: 40.0000 mg | ORAL_TABLET | Freq: Every day | ORAL | Status: DC

## 2015-06-05 MED ORDER — EZETIMIBE 10 MG PO TABS: 10.0000 mg | ORAL_TABLET | Freq: Every day | ORAL | Status: DC

## 2015-06-05 MED ORDER — OMEGA-3-ACID ETHYL ESTERS 1 G PO CAPS: 2.0000 g | ORAL_CAPSULE | Freq: Two times a day (BID) | ORAL | Status: DC

## 2015-06-05 MED ORDER — RANOLAZINE ER 500 MG PO TB12: 500.0000 mg | ORAL_TABLET | Freq: Two times a day (BID) | ORAL | Status: DC

## 2015-06-05 MED ORDER — ASPIRIN 81 MG PO CHEW
81.0000 mg | CHEWABLE_TABLET | Freq: Every day | ORAL | Status: DC
Start: 2015-06-05 — End: 2016-02-07

## 2015-06-05 NOTE — Progress Notes (Signed)
Patient Name: [name] Date of Encounter: 06/05/2015     Principal Problem:   Unstable angina Active Problems:   Hyperlipidemia with target LDL less than 70   Anxiety state   Essential hypertension   Hx of CABG 2005   PVD- 50-69% bilat ICA    CAD S/P multiple PCI after CABG   Obesity-BMI 41    SUBJECTIVE  No CP or SOB. She never has headaches and had a terrible HA all last night. Explained this could be a side effect of imdur  CURRENT MEDS . amLODipine  10 mg Oral Daily  . aspirin  81 mg Oral Daily  . benazepril  20 mg Oral Daily  . doxepin  25 mg Oral QHS  . ezetimibe  10 mg Oral Daily  . isosorbide mononitrate  30 mg Oral Daily  . levothyroxine  75 mcg Oral QAC breakfast  . metoprolol succinate  50 mg Oral Daily  . multivitamin with minerals  1 tablet Oral BID  . omega-3 acid ethyl esters  2 g Oral BID  . pantoprazole  40 mg Oral Daily  . prasugrel  10 mg Oral Daily  . pravastatin  40 mg Oral q1800  . sodium chloride  3 mL Intravenous Q12H    OBJECTIVE  Filed Vitals:   06/04/15 0532 06/04/15 1343 06/04/15 2025 06/05/15 0611  BP:  116/61 102/55 136/72  Pulse:   54 61  Temp:  98 F (36.7 C) 97.8 F (36.6 C) 97.7 F (36.5 C)  TempSrc:  Oral Oral Oral  Resp:  16 18 18   Height:      Weight: 236 lb (107.049 kg)   235 lb 1.6 oz (106.641 kg)  SpO2:  97% 98% 99%    Intake/Output Summary (Last 24 hours) at 06/05/15 0836 Last data filed at 06/04/15 1732  Gross per 24 hour  Intake    120 ml  Output      0 ml  Net    120 ml   Filed Weights   06/03/15 1543 06/04/15 0532 06/05/15 0611  Weight: 234 lb (106.142 kg) 236 lb (107.049 kg) 235 lb 1.6 oz (106.641 kg)    PHYSICAL EXAM  General: Pleasant, NAD. Obese  Neuro: Alert and oriented X 3. Moves all extremities spontaneously. Psych: Normal affect. HEENT:  Normal  Neck: Supple without bruits or JVD. Lungs:  Resp regular and unlabored, CTA. Heart: RRR no s3, s4, or murmurs. Abdomen: Soft,  non-tender, non-distended, BS + x 4.  Extremities: No clubbing, cyanosis or edema. DP/PT/Radials 2+ and equal bilaterally.  Accessory Clinical Findings  CBC  Recent Labs  06/03/15 1216 06/03/15 1334 06/03/15 1353  WBC 7.7  --   --   HGB 11.6* 12.2  12.2 12.2  HCT 35.0* 36.0  36.0 36.0  MCV 92.6  --   --   PLT 233  --   --    Basic Metabolic Panel  Recent Labs  06/03/15 1216  06/03/15 1353 06/04/15 0205  NA 136  < > 138 136  K 5.9*  < > 4.4 4.6  CL 103  < > 103 102  CO2 26  --   --  29  GLUCOSE 89  < > 92 97  BUN 15  < > 17 17  CREATININE 0.98  < > 0.90 0.93  CALCIUM 8.7*  --   --  8.6*  < > = values in this interval not displayed. Liver Function Tests  Recent Labs  06/04/15  0205  AST 19  ALT 19  ALKPHOS 69  BILITOT 0.5  PROT 6.1*  ALBUMIN 3.5   No results for input(s): LIPASE, AMYLASE in the last 72 hours. Cardiac Enzymes  Recent Labs  06/03/15 1316 06/03/15 2008 06/04/15 0205  TROPONINI <0.03 <0.03 <0.03    Fasting Lipid Panel  Recent Labs  06/04/15 0205  CHOL 196  HDL 31*  LDLCALC 101*  TRIG 319*  CHOLHDL 6.3   TELE  NSR with some sinus brady. Freq PVCs and a short run of what looks like PACs  Radiology/Studies  Dg Chest 2 View  06/03/2015   CLINICAL DATA:  Severe left-sided chest pain with difficulty breathing.  EXAM: CHEST  2 VIEW  COMPARISON:  05/28/2014  FINDINGS: There is no focal parenchymal opacity. There is no pleural effusion or pneumothorax. The heart and mediastinal contours are unremarkable. There is evidence of prior CABG.  The osseous structures are unremarkable.  IMPRESSION: No active cardiopulmonary disease.   Electronically Signed   By: Elige Ko   On: 06/03/2015 12:09   LHC 05/31/15 Conclusion    1. Mid Graft lesion, 30% stenosed. The lesion was previously treated with a stent (unknown type) .   Bypass graft failure with occlusion of the saphenous vein graft to the left anterior descending and the circumflex  coronary artery.  Patent left internal mammary graft to the diagonal and patent saphenous vein graft to the distal right coronary. The stents within the mid to distal body of the right coronary saphenous vein graft or patent (multiple layers of stent within this region are noted).  Severe native vessel coronary disease with total occlusion of the proximal RCA, total occlusion of the left anterior descending and total occlusion of the mid circumflex. The first obtuse marginal supplies collaterals to the distal circumflex.  Left ventricular dysfunction with moderate inferior wall hypokinesis and overall ejection fraction of 35-40%. RECOMMENDATIONS:  Continued aggressive risk factor modification  No anatomy amenable to percutaneous intervention.       ASSESSMENT AND PLAN  [name] is a 59 y.o. female with a history of CABG 2005, DES to pSVG-RPDA 2015, mod carotid dz, HTN, HLD, GERD, hypothyroid, tobacco abuse, anemia and ICM w/ EF 50-55% in 03/2014 who presented to [facility] on 06/03/15 with chest pain.   Chest pain- -- Troponin neg x3, ECG with no acute ST or TW changes.  -- She underwent LHC on 06/03/15 which revealed no significant change in her anatomy and the plan is for continued medical Rx -- Decreased ASA to 81 mg daily as she is on Effient and imdur 30mg  added to regimen. Continue statin and BB.  -- Imdur may be responsible for her new HA. Will discontinue. If she continues to have CP she can talk to Dr. Wyline Mood next week about possibly starting Ranexa  Ischemic CM- EF 35-40% by LV gram -- Appears euvolemic -- Continue benazepril and toprol xl  HLD- LDL 101; TG 319, HDL 31.Lovastatin changed to pravastain 40 mg. Zetia 10 mg and fish oil 2 gm bid added   Grief/Anxiety- Pt is grieving over loss of her close friend for 47 yrs  Hyperkalemia- likely due to hemolysis. Now resolved.   HTN- well controlled on amlodipine 10mg , benazepril 20mg , toprol xl 50mg   Carotid artery  stenosis- followed by Dr. Wyline Mood  Tobacco abuse- counseled on cessation  Dispo- likely home today with follow up in [facility] with Dr. Wyline Mood on 06/13/15.   Signed, Cline Crock R PA-C  Pager [phone]   Patient seen and examined. Agree with assessment and plan. Significant nitrate headache after imdur was started. Will send home today with trial of ranexa 500 mg bid with sig CAD and graft failure; f/u with Dr. Wyline Mood.   Lennette Bihari, MD, [facility] 06/05/2015 11:26 AM

## 2015-06-05 NOTE — Discharge Summary (Signed)
Discharge Summary   Patient ID: [name] MRN: [id number], DOB/AGE: Jan 17, 1956 59 y.o. Admit date: 06/03/2015 D/C date:     06/05/2015  Primary Cardiologist: Dr. Wyline Mood  Principal Problem:   Unstable angina Active Problems:   Hyperlipidemia with target LDL less than 70   Essential hypertension   Hx of CABG 2005   PVD- 50-69% bilat ICA    GERD   Anemia   CAD S/P multiple PCI after CABG   Obesity-BMI 41   Hypothyroidism   Ischemic cardiomyopathy   Admission Dates: 06/03/15-06/05/15 Discharge Diagnosis: chest pain s/p LHC on 06/03/15 which revealed no significant change in her anatomy --> continued medical Rx  HPI: [name] is a 59 y.o. female with a history of CABG 2005, DES to pSVG-RPDA 2015, mod carotid dz, HTN, HLD, GERD, hypothyroid, tobacco abuse, anemia and ICM w/ EF 50-55% in 03/2014 who presented to [facility] on 06/03/15 with chest pain worrisome for [place].   Ms Heron complained of intermittent chest pain for 4 days prior to admission. The pain is described a squeezing and shooting. The episodes start in her chest, radiate to her L arm and jaw. The worst episode was 10/10. She had some nausea (no vomiting), and some SOB with the pain. The pain was so severe on the day of admission that she called EMS.   Hospital Course  Chest pain concerning for [place] -- Troponin neg x3, ECG with no acute ST or TW changes.  -- She underwent LHC on 06/03/15 which revealed no significant change in her anatomy and the plan is for continued medical Rx -- Decreased ASA to 81 mg daily as she is on Effient and imdur 30mg  added to regimen. Continue statin and BB.  -- Significant nitrate headache after imdur was started. Will send home today with trial of ranexa 500 mg bid with sig CAD and graft failure; f/u with Dr. Wyline Mood.  Ischemic CM- EF 35-40% by LV gram -- Appears euvolemic -- Continue benazepril and toprol xl  HLD- LDL 101; TG 319, HDL 31.Lovastatin changed to pravastain 40 mg. Zetia 10  mg and fish oil 2 gm bid added   Grief/Anxiety- Pt is grieving over loss of her close friend for 47 yrs  Hyperkalemia- likely due to hemolysis. Now resolved.   HTN- well controlled on amlodipine 10mg , benazepril 20mg , toprol xl 50mg   Carotid artery stenosis- followed by Dr. Wyline Mood  Tobacco abuse- counseled on cessation  Dispo- likely home today with follow up in [facility] with Dr. Wyline Mood on 06/13/15.   The patient has had an uncomplicated hospital course and is recovering well. The femoral catheter site is stable. She has been seen by Dr. Tresa Endo today and deemed ready for discharge home. All follow-up appointments have been scheduled. Smoking cessation was disscussed in length. Discharge medications are listed below.   Discharge Vitals: Blood pressure 136/72, pulse 61, temperature 97.7 F (36.5 C), temperature source Oral, resp. rate 18, height 5\' 4"  (1.626 m), weight 235 lb 1.6 oz (106.641 kg), SpO2 99 %.  Labs: Lab Results  Component Value Date   WBC 7.7 06/03/2015   HGB 12.2 06/03/2015   HCT 36.0 06/03/2015   MCV 92.6 06/03/2015   PLT 233 06/03/2015     Recent Labs Lab 06/04/15 0205  NA 136  K 4.6  CL 102  CO2 29  BUN 17  CREATININE 0.93  CALCIUM 8.6*  PROT 6.1*  BILITOT 0.5  ALKPHOS 69  ALT 19  AST 19  GLUCOSE 97    Recent Labs  06/03/15 1316 06/03/15 2008 06/04/15 0205  TROPONINI <0.03 <0.03 <0.03   Lab Results  Component Value Date   CHOL 196 06/04/2015   HDL 31* 06/04/2015   LDLCALC 101* 06/04/2015   TRIG 319* 06/04/2015   No results found for: DDIMER  Diagnostic Studies/Procedures   Dg Chest 2 View  06/03/2015   CLINICAL DATA:  Severe left-sided chest pain with difficulty breathing.  EXAM: CHEST  2 VIEW  COMPARISON:  05/28/2014  FINDINGS: There is no focal parenchymal opacity. There is no pleural effusion or pneumothorax. The heart and mediastinal contours are unremarkable. There is evidence of prior CABG.  The osseous structures are unremarkable.   IMPRESSION: No active cardiopulmonary disease.   Electronically Signed   By: Elige Ko   On: 06/03/2015 12:09    LHC 05/31/15 Conclusion    1. Mid Graft lesion, 30% stenosed. The lesion was previously treated with a stent (unknown type) .   Bypass graft failure with occlusion of the saphenous vein graft to the left anterior descending and the circumflex coronary artery.  Patent left internal mammary graft to the diagonal and patent saphenous vein graft to the distal right coronary. The stents within the mid to distal body of the right coronary saphenous vein graft or patent (multiple layers of stent within this region are noted).  Severe native vessel coronary disease with total occlusion of the proximal RCA, total occlusion of the left anterior descending and total occlusion of the mid circumflex. The first obtuse marginal supplies collaterals to the distal circumflex.  Left ventricular dysfunction with moderate inferior wall hypokinesis and overall ejection fraction of 35-40%. RECOMMENDATIONS:  Continued aggressive risk factor modification  No anatomy amenable to percutaneous intervention.          Discharge Medications     Medication List    STOP taking these medications        aspirin 325 MG tablet  Replaced by:  aspirin 81 MG chewable tablet     lovastatin 40 MG tablet  Commonly known as:  MEVACOR  Replaced by:  pravastatin 40 MG tablet      TAKE these medications        ALPRAZolam 1 MG tablet  Commonly known as:  XANAX  Take 1 mg by mouth 4 (four) times daily as needed for anxiety or sleep.     amLODipine 10 MG tablet  Commonly known as:  NORVASC  Take 1 tablet (10 mg total) by mouth daily.     aspirin 81 MG chewable tablet  Chew 1 tablet (81 mg total) by mouth daily.     benazepril 20 MG tablet  Commonly known as:  LOTENSIN  TAKE (1) TABLET BY MOUTH EACH MORNING.     doxepin 25 MG capsule  Commonly known as:  SINEQUAN  Take 25 mg by mouth  at bedtime.     EFFIENT 10 MG Tabs tablet  Generic drug:  prasugrel  TAKE (1) TABLET BY MOUTH EACH MORNING.     ezetimibe 10 MG tablet  Commonly known as:  ZETIA  Take 1 tablet (10 mg total) by mouth daily.     furosemide 80 MG tablet  Commonly known as:  LASIX  TAKE ONE TABLET DAILY. CALL OFFICE AND MAKE AN APPOINTMENT.     HYDROcodone-acetaminophen 10-325 MG per tablet  Commonly known as:  NORCO  Take 1 tablet by mouth every 4 (four) hours as needed for moderate pain (pain).  levothyroxine 75 MCG tablet  Commonly known as:  SYNTHROID, LEVOTHROID  Take 75 mcg by mouth daily before breakfast.     methocarbamol 500 MG tablet  Commonly known as:  ROBAXIN  Take 1 tablet (500 mg total) by mouth 2 (two) times daily as needed for muscle spasms.     metoprolol succinate 50 MG 24 hr tablet  Commonly known as:  TOPROL-XL  TAKE (1) TABLET BY MOUTH EACH MORNING WITH OR IMMEDIATELY FOLLOWING AMEAL.     multivitamin with minerals Tabs tablet  Take 1 tablet by mouth 2 (two) times daily.     nitroGLYCERIN 0.4 MG SL tablet  Commonly known as:  NITROSTAT  Place 0.4 mg under the tongue every 5 (five) minutes as needed for chest pain. If no relief in 15 minutes call 911     omega-3 acid ethyl esters 1 G capsule  Commonly known as:  LOVAZA  Take 2 capsules (2 g total) by mouth 2 (two) times daily.     omeprazole 20 MG capsule  Commonly known as:  PRILOSEC  TAKE ONE BY MOUTH CAPSULE DAILY.     potassium chloride SA 20 MEQ tablet  Commonly known as:  K-DUR,KLOR-CON  TAKE (1) TABLET BY MOUTH EACH MORNING.     pravastatin 40 MG tablet  Commonly known as:  PRAVACHOL  Take 1 tablet (40 mg total) by mouth daily at 6 PM.     albuterol (2.5 MG/3ML) 0.083% nebulizer solution  Commonly known as:  PROVENTIL  Take 2.5 mg by nebulization every 6 (six) hours as needed for wheezing or shortness of breath.     PROAIR HFA 108 (90 BASE) MCG/ACT inhaler  Generic drug:  albuterol  Inhale 2 puffs  into the lungs every 6 (six) hours as needed for wheezing or shortness of breath.     ranolazine 500 MG 12 hr tablet  Commonly known as:  RANEXA  Take 1 tablet (500 mg total) by mouth 2 (two) times daily.        Disposition   The patient will be discharged in stable condition to home.  Follow-up Information    Follow up with Dina Rich, MD On 06/13/2015.   Specialty:  Cardiology   Why:  1:40 pm   Contact information:   [address] [place] [address] [phone]         Duration of Discharge Encounter: Greater than 30 minutes including physician and PA time.  Byrd Hesselbach R PA-C 06/05/2015, 12:32 PM

## 2015-06-05 NOTE — Discharge Instructions (Signed)

## 2015-06-07 ENCOUNTER — Other Ambulatory Visit: Payer: Self-pay | Admitting: Cardiology

## 2015-06-13 ENCOUNTER — Encounter: Payer: Self-pay | Admitting: Cardiology

## 2015-06-13 NOTE — Progress Notes (Signed)
Patient ID: [name], female   DOB: 09-28-1956, 60 y.o.   MRN: [id number]    ERROR

## 2015-06-20 ENCOUNTER — Other Ambulatory Visit: Payer: Self-pay | Admitting: Cardiology

## 2015-06-28 ENCOUNTER — Encounter: Payer: Self-pay | Admitting: Cardiology

## 2015-06-28 ENCOUNTER — Encounter: Payer: Medicaid Other | Admitting: Cardiology

## 2015-06-28 NOTE — Progress Notes (Signed)
Patient ID: [name], female   DOB: 1956/09/05, 59 y.o.   MRN: [id number]     Clinical Summary [name] is a 59 y.o.female  1. CAD  - CABG 2005 4 vessel  - last cath 05/2015 results as reported below, no targets for pci, recs for medical management. Was started on ranexa 518m bid, had headache on nitrates.     2. Carotid stenosis  - Carotid [place] with moderate bilateral disease  - denies any neuro symptoms   Needs repeat carotid   3. HTN  - compliant meds   4. Tobacco  - quit few months ago - 5. Hyperlipidemia  - compliant with statin  - 03/2014 lipid panel: TC 174 TG 293 HDL 36 LDL 79 - tried to change to crestor last visit, however she reports severe fatigue. Has only been able to tolerate lovastatin which she is back on  Past Medical History  Diagnosis Date  . Coronary artery disease     a. s/p prior CABG 2005. b. BMS 02/2009 to VG-PDA. c. NSTEMI 08/2010 s/p PTCA/DES to SVG-PDA in previously stented area. d. DES to SVG to RCA on 05/24/14  c. LHC 06/04/2015 w/ no sig change in her anatomy and Rx managment  . Hypertension   . Hyperlipidemia   . GERD (gastroesophageal reflux disease)   . Diverticulitis     4 documented episodes by CT; most recent April 2012  . Depression   . Anxiety   . Hypothyroidism   . COPD (chronic obstructive pulmonary disease)   . Asthma   . Sleep apnea     a. Mild-to-moderate obstructive sleep apnea diagnosed in April 2012 by sleep study.  . Anemia   . History of blood transfusion   . Tension headache   . Seizures   . Arthritis   . Chronic back pain   . DDD (degenerative disc disease)   . Kidney stones   . Acute renal failure   . Ischemic cardiomyopathy     a. EF 40% in 2012 by cath. b. improved to 50-55% by cath 05/2014  [facility]  . Carotid artery disease     a. Duplex 50-69% BICA 03/2014.  .Marland KitchenSinus bradycardia   . HTN (hypertension)      Allergies  Allergen Reactions  . Adhesive [Tape] Itching and  Other (See Comments)    Blisters (pls use paper tape)  . Crestor [Rosuvastatin] Other (See Comments)    Muscle pain  . Lipitor [Atorvastatin] Other (See Comments)    Cramps  . Morphine And Related Itching and Nausea And Vomiting    Taken off per patient request. States she "can take this"     Current Outpatient Prescriptions  Medication Sig Dispense Refill  . albuterol (PROAIR HFA) 108 (90 BASE) MCG/ACT inhaler Inhale 2 puffs into the lungs every 6 (six) hours as needed for wheezing or shortness of breath.    .Marland Kitchenalbuterol (PROVENTIL) (2.5 MG/3ML) 0.083% nebulizer solution Take 2.5 mg by nebulization every 6 (six) hours as needed for wheezing or shortness of breath.    . ALPRAZolam (XANAX) 1 MG tablet Take 1 mg by mouth 4 (four) times daily as needed for anxiety or sleep.     .Marland KitchenamLODipine (NORVASC) 10 MG tablet Take 1 tablet (10 mg total) by mouth daily. 90 tablet 2  . aspirin 81 MG chewable tablet Chew 1 tablet (81 mg total) by mouth daily.    . benazepril (LOTENSIN) 20 MG  tablet TAKE (1) TABLET BY MOUTH EACH MORNING. 30 tablet 6  . doxepin (SINEQUAN) 25 MG capsule Take 25 mg by mouth at bedtime.    Marland Kitchen EFFIENT 10 MG TABS tablet TAKE (1) TABLET BY MOUTH EACH MORNING. 30 tablet 0  . ezetimibe (ZETIA) 10 MG tablet Take 1 tablet (10 mg total) by mouth daily. 30 tablet 11  . furosemide (LASIX) 80 MG tablet TAKE ONE TABLET DAILY. CALL OFFICE AND MAKE AN APPOINTMENT. 30 tablet 0  . HYDROcodone-acetaminophen (NORCO) 10-325 MG per tablet Take 1 tablet by mouth every 4 (four) hours as needed for moderate pain (pain).     Marland Kitchen levothyroxine (SYNTHROID, LEVOTHROID) 75 MCG tablet Take 75 mcg by mouth daily before breakfast.     . methocarbamol (ROBAXIN) 500 MG tablet Take 1 tablet (500 mg total) by mouth 2 (two) times daily as needed for muscle spasms. (Patient not taking: Reported on 06/03/2015) 10 tablet 0  . metoprolol succinate (TOPROL-XL) 50 MG 24 hr tablet TAKE (1) TABLET BY MOUTH EACH MORNING WITH OR  IMMEDIATELY FOLLOWING AMEAL. 30 tablet 6  . Multiple Vitamin (MULTIVITAMIN WITH MINERALS) TABS tablet Take 1 tablet by mouth 2 (two) times daily. 25 tablet 4  . NITROSTAT 0.4 MG SL tablet DISSOLVE 1 TABLET UNDER TONGUE EVERY 5 MINUTES UP TO 15 MIN FOR CHESTPAIN. IF NO RELIEF CALL 911. 25 tablet 3  . omega-3 acid ethyl esters (LOVAZA) 1 G capsule Take 2 capsules (2 g total) by mouth 2 (two) times daily. 60 capsule 11  . omeprazole (PRILOSEC) 20 MG capsule TAKE ONE BY MOUTH CAPSULE DAILY. 30 capsule 3  . potassium chloride SA (K-DUR,KLOR-CON) 20 MEQ tablet TAKE (1) TABLET BY MOUTH EACH MORNING. (Patient taking differently: TAKE (1) TABLET BY MOUTH EACH MORNING. pt only takes with lasix prn) 30 tablet 11  . pravastatin (PRAVACHOL) 40 MG tablet Take 1 tablet (40 mg total) by mouth daily at 6 PM. 30 tablet 11  . ranolazine (RANEXA) 500 MG 12 hr tablet Take 1 tablet (500 mg total) by mouth 2 (two) times daily. 60 tablet 11  . [DISCONTINUED] carbamazepine (TEGRETOL XR) 200 MG 12 hr tablet Take 200 mg by mouth 2 (two) times daily.      . [DISCONTINUED] lamoTRIgine (LAMICTAL) 100 MG tablet Take 100 mg by mouth 2 (two) times daily.       No current facility-administered medications for this visit.     Past Surgical History  Procedure Laterality Date  . Coronary artery bypass graft  2005    LIMA to AL, SVG to LAD, SVG to Cx, SVG-PDA  . Carotid endarterectomy  2005  . Knee arthroscopy w/ acl reconstruction Left 2001  . Carpal tunnel with cubital tunnel Right 2001  . Coronary angioplasty with stent placement  2011    Promus Premier DES, 3.5 mm x 12 mm, for 99% proximal stent ISR in the SVG-RPDA   . Cardiac catheterization  Jan 2012    patent stents in RCA graft; CAD stable  . Coronary angioplasty with stent placement  05/24/2014  . Appendectomy  ~ 1970  . Laparoscopic cholecystectomy  2010  . Vaginal hysterectomy  ~ 1993  . Tubal ligation  ~ 1989  . Left heart catheterization with coronary/graft  angiogram N/A 05/24/2014    Procedure: LEFT HEART CATHETERIZATION WITH Beatrix Fetters;  Surgeon: Leonie Man, MD; EF 50-55%, LAD & D1 100%, LIMA-D1 OK, CFX OK, OM1 & OM2 100%, lat OM Gwendloyn Forsee 100%, RCA 100%; pSVG-RPDA 99% rx  w/ scoring balloon PTCA & 3.5 x 12 mm Promus DES; SVG-LAD, SVG-OM known occluded      . Cardiac catheterization N/A 06/03/2015    Procedure: Left Heart Cath and Cors/Grafts Angiography;  Surgeon: Belva Crome, MD;  Location: [facility];  Service: Cardiovascular;  Laterality: N/A;     Allergies  Allergen Reactions  . Adhesive [Tape] Itching and Other (See Comments)    Blisters (pls use paper tape)  . Crestor [Rosuvastatin] Other (See Comments)    Muscle pain  . Lipitor [Atorvastatin] Other (See Comments)    Cramps  . Morphine And Related Itching and Nausea And Vomiting    Taken off per patient request. States she "can take this"      Family History  Problem Relation Age of Onset  . Heart attack Mother   . Heart disease Mother   . Breast cancer Mother   . Heart attack Father   . Heart disease Father   . Colon cancer Neg Hx      Social History Ms. Galvan reports that she quit smoking about 17 months ago. Her smoking use included Cigarettes. She has a 10.5 pack-year smoking history. She has never used smokeless tobacco. Ms. Benoist reports that she does not drink alcohol.   Review of Systems CONSTITUTIONAL: No weight loss, fever, chills, weakness or fatigue.  HEENT: Eyes: No visual loss, blurred vision, double vision or yellow sclerae.No hearing loss, sneezing, congestion, runny nose or sore throat.  SKIN: No rash or itching.  CARDIOVASCULAR:  RESPIRATORY: No shortness of breath, cough or sputum.  GASTROINTESTINAL: No anorexia, nausea, vomiting or diarrhea. No abdominal pain or blood.  GENITOURINARY: No burning on urination, no polyuria NEUROLOGICAL: No headache, dizziness, syncope, paralysis, ataxia, numbness or tingling in the extremities.  No change in bowel or bladder control.  MUSCULOSKELETAL: No muscle, back pain, joint pain or stiffness.  LYMPHATICS: No enlarged nodes. No history of splenectomy.  PSYCHIATRIC: No history of depression or anxiety.  ENDOCRINOLOGIC: No reports of sweating, cold or heat intolerance. No polyuria or polydipsia.  Marland Kitchen   Physical Examination There were no vitals filed for this visit. There were no vitals filed for this visit.  Gen: resting comfortably, no acute distress HEENT: no scleral icterus, pupils equal round and reactive, no palptable cervical adenopathy,  CV Resp: Clear to auscultation bilaterally GI: abdomen is soft, non-tender, non-distended, normal bowel sounds, no hepatosplenomegaly MSK: extremities are warm, no edema.  Skin: warm, no rash Neuro:  no focal deficits Psych: appropriate affect   Diagnostic Studies Cath 2012  Left main coronary was normal.  Left anterior descending artery was occluded just after the septal  perforator. First diagonal Leo Weyandt was subtotally occluded. There was  some filling of the distal LAD and diagonals from collateral branches  from the LIMA. Circumflex coronary artery was occluded in the  midvessel. The AV groove Yana Schorr was patent. There was a small first  obtuse marginal Jobin Montelongo with 30-40% multiple lesions. There was a small  second obtuse marginal Bonny Egger with 30-40% discrete lesions. There is a  very large obtuse marginal Landi Biscardi seen filling from collaterals from the  left internal mammary artery.  Right coronary artery was 100% occluded proximally with both left-to-  right and right-to-right collaterals. The distal PDA also filled from  the vein graft to the right coronary artery.  The left internal mammary artery was widely patent. It also filled the  distal right large obtuse marginal Addysin Porco and filled retrograde to a  smaller diagonal Sheilah Rayos.  There was known occlusion of the 2 left-sided vein grafts. The  saphenous  vein graft to the right coronary artery was widely patent.  Bare-metal stent in the mid-to-distal vessel widely patent. There was  poor runoff to the vessel primarily being retrograde into the posterior  lateral Nikita Humble, however, there was no new focal stenosis.  RAO ventriculography. RAO ventriculography showed anterior apical and  mid inferior wall hypokinesis. EF was in the 40% range.  Right heart catheterization showed mean pulmonary capillary wedge  pressure of 24, PA pressure was 55/26, RV pressure was 52/5, RA pressure  mean was 16, aortic pressure was 155/76, LV pressure was equal to 167/16  with a post A-wave EDP of 26.  Cardiac output was 6.3 liters per minute with a cardiac index of 3  liters per minute per meter squared by Fick.   IMPRESSION: The patient's coronary artery disease is stable. The  stents in the RCA graft were patent. There are no new lesions that I  can see. She does have collateralized distal right and OM Adrian Dinovo.  Continued medical therapy is warranted. If she continues to have chest  pain, Ranexa may be in order since it is somewhat late in the day and  she had both venous and arterial sheath. She will be kept overnight and  discharged in the morning. She tolerated the procedure well.   03/2014 Echo  Study Conclusions  - Left ventricle: The cavity size was normal. Wall thickness was increased in a pattern of mild LVH. Systolic function was normal. The estimated ejection fraction was in the range of 50% to 55%. There is akinesis of the basalinferolateral myocardium. Features are consistent with a pseudonormal left ventricular filling pattern, with concomitant abnormal relaxation and increased filling pressure (grade 2 diastolic dysfunction). - Aortic valve: Mildly calcified annulus. Trileaflet. There was no significant regurgitation. - Mitral valve: There was trivial regurgitation. - Left atrium: The atrium was mildly to moderately  dilated. - Right ventricle: Systolic function was mildly reduced. - Right atrium: Central venous pressure (est): 3 mm Hg. - Atrial septum: No defect or patent foramen ovale was identified. - Tricuspid valve: There was trivial regurgitation. - Pulmonary arteries: Systolic pressure could not be accurately estimated. - Pericardium, extracardiac: There was no pericardial effusion.  Impressions:  - Mild LVH with LVEF 50-55%, basal inferolateral hypokinesis, grade 2 diastolic dysfunction. Mild to moderate left atrial enlargement. Mildly reduced RV contraction. Unable to assess PASP.  03/2014 Carotid US  IMPRESSION:  Bilateral 50-69% stenosis. The left internal carotid peak systolic  velocity has increased slightly in the interval from the prior exam.  05/2014 Cath FINDINGS:  Hemodynamics:  1. Central Aortic Pressure / Mean: 110/62/81 mmHg 2. Left Ventricular Pressure / LVEDP: 110/14/21 mmHg  Left Ventriculography:  EF: 50 at 55 %  Wall Motion: Severe hypokinesis of the Basal to MID inferior-inferolateraL wall with mild hypokinesis of the basal anterior wall  Coronary Anatomy:  Dominance: Right  Left Main: Normal caliber vessel that itself is free of significant disease. Bifurcates into the LAD and Circumflex LAD: Moderate caliber vessel that is 100% occluded after a septal perforator and small diagonal Marcianne Ozbun. There is a first diagonal Hiroyuki Ozanich that is also half percent occluded after initial subtotal occlusion.  Left Circumflex: Moderate caliber vessel it gives rise to a proximal small caliber OM Ramadan Couey that is somewhat tortuous and free of significant disease. The vessel then bifurcates into the introducer complex which is small in caliber and  perfuses the basal inferolateral wall. There is a major lateral OM Ludie Pavlik the visualized the proximal segment is occluded. There is late retrograde filling of the remainder the nature OM which appears to have 2 branches distally. This is  faint collaterals from the proximal OM.   RCA: 100% proximal occluded. SVG-RPDA: Large-caliber graft with mildly irregular is proximally, there are overlapping stents in the distal mid graft with 99% napkin ring proximal in-stent restenosis of the most proximal stent that extends just minimally to the unstented segment. The remainder of the graft is relatively free of disease and attached to the mid RPDA.  RPDA: Large-caliber tortuous vessel that reaches almost to the apex.  RPL Sysytem:The RPAV fills via retrograde flow in the RPDA. There are several small posterolateral branches that are relatively free of disease. The PAV is relatively free of disease.  After reviewing the initial angiography, the culprit lesion was thought to be the 99% proximal stent ISR in the SVG-RPDA. Preparation were made to proceed with PCI on this lesion. As the lesion is focal & appears to be fibrous in nature and too stenosed to safely pass a filter wire, the decision was made to pre-dilate & stent without distal protection. No evidence of a filling defect was noted besides the "napkin-ring" lesion.  Percutaneous Coronary Intervention: Sheath exchanged for 6 Fr Guide: 6 Fr AL1Guidewire: BMW (after failed attempt with Pro-Water Predilation Balloon: Angioscult Cutting Balloon 3.5 mm x 10 mm;   12 Atm x 45 Sec,-- excellent predilation of the fibrous lesion at high pressure. Brisk TIMI-3 flow down stream but no sign of no reflow. The decision was made to proceed with stenting. Stent: Promus Premier DES 3.5 mm x 12 mm; overlaps roughly 4 mm into the original stent.  Deployed at: 20 Atm x 30 Sec Post-dilation Balloon: Idamay Emerge 4.0 mm x 8 mm;   14 Atm x 30 Sec x 2 -   Final Diameter: 4.1 mm  Post deployment angiography in multiple views, with and without guidewire in place revealed excellent stent deployment and lesion coverage. There was no evidence of dissection or perforation. Brisk TIMI-3 flow  is noted in the downstream vessel  MEDICATIONS:  Anesthesia: Local Lidocaine 2 ml  Sedation: 4 mg IV Versed, 100 mcg IV fentanyl ;   Omnipaque Contrast: 180 ml  Anticoagulation: IV Heparin 13500 Units total   Anti-Platelet Agent: Effient and aspirin as standing home medication  PATIENT DISPOSITION:   The patient was transferred to the PACU holding area in a hemodynamicaly stable, chest pain free condition.  The patient tolerated the procedure well, and there were no complications. EBL: < 5 ml  The patient was stable before, during, and after the procedure.  POST-OPERATIVE DIAGNOSIS:   Severe native CAD with 100% negative RCA, LAD & D1 along with OM 1 and OM 2 as previously described  Severe proximal edge in-stent restenosis of SVG-RPDA   Successful PCI of the proximal edge in-stent restenosis of SVG RCA with scoring balloon angioplasty followed by DES stent placement: Promus Premier 3.5 mm x 12 mm postdilated to 4.1 mm  Widely patent LIMA-diagonal with distal collaterals to what appears to be the apical LAD.  Known occlusion of the SVG-LAD and SVG-OM  Relatively preserved LVEF as noted on the cardiogram recently. There is known hypokinesis to akinesis of the nasal to mid inferior inferolateral wall as well as mild hypokinesis of the basal anterior wall assistant with known CAD.  PLAN OF CARE:  Overnight observation post-PCI.  Continue home doses of aspirin plus Effient.  Standard post radial cath care with care being removal   Discharge in the morning if stable.  Followup with Dr. Harl Bowie - continue to optimize medical management of existing severe CAD.   05/2015 Cath  Dominance: Co-dominant   Left Anterior Descending   . Mid LAD lesion, 100% stenosed.   . First Diagonal Myan Suit   The vessel is small in size.   . 1st Diag lesion, 99% stenosed.     Ramus Intermedius  The vessel is small .     Left Circumflex  The vessel is  small .   Marland Kitchen Mid Cx to Dist Cx lesion, 100% stenosed.   . Second Obtuse Marginal Teyanna Thielman   The vessel is small in size.   . Third Obtuse Marginal Jaymien Landin   3rd Mrg filled by collaterals from 1st Mrg.     Right Coronary Artery   . Prox RCA to Mid RCA lesion, 100% stenosed.   . Dist RCA lesion, 100% stenosed.     Graft Angiography    Free Graft to Dist LAD  SVG   . Prox Graft to Mid Graft lesion, 100% stenosed.     Free Graft to Dist RCA  SVG   . Mid Graft lesion, 30% stenosed. The lesion was previously treated with a stent (unknown type) .     Free Graft to 2nd Mrg  SVG   . Origin to Prox Graft lesion, 100% stenosed.     Free LIMA Graft to 2nd Diag  LIMA        Cath RECOMMENDATIONS:   Continued aggressive risk factor modification  No anatomy amenable to percutaneous intervention.             Assessment and Plan  1. CAD  - progression of exertional chest pain concerning for angina despite being on 2 antianginals. Will refer for LHC. She has been on long term effient, will continue at this time.   2. Carotid stenosis  - 03/2014 carotid US with moderate bilateral disease  - continue to follow   3. HTN  - at goal, continue current meds   4. Tobacco  - she has succesfully stopped smoking   5. Hyperlipidemia  - has not tolerated high dose statins, continue lovastatin       Arnoldo Lenis, M.D., F.A.C.C.

## 2015-07-04 ENCOUNTER — Encounter: Payer: Self-pay | Admitting: Orthopedic Surgery

## 2015-07-04 ENCOUNTER — Ambulatory Visit: Payer: Medicaid Other | Admitting: Orthopedic Surgery

## 2015-07-08 ENCOUNTER — Ambulatory Visit (INDEPENDENT_AMBULATORY_CARE_PROVIDER_SITE_OTHER): Payer: Medicaid Other | Admitting: Cardiology

## 2015-07-08 ENCOUNTER — Encounter: Payer: Self-pay | Admitting: Cardiology

## 2015-07-08 VITALS — BP 92/60 | HR 66 | Ht 63.5 in | Wt 235.0 lb

## 2015-07-08 DIAGNOSIS — I6529 Occlusion and stenosis of unspecified carotid artery: Secondary | ICD-10-CM

## 2015-07-08 DIAGNOSIS — I2510 Atherosclerotic heart disease of native coronary artery without angina pectoris: Secondary | ICD-10-CM

## 2015-07-08 DIAGNOSIS — E785 Hyperlipidemia, unspecified: Secondary | ICD-10-CM | POA: Diagnosis not present

## 2015-07-08 DIAGNOSIS — I10 Essential (primary) hypertension: Secondary | ICD-10-CM | POA: Diagnosis not present

## 2015-07-08 MED ORDER — PRAVASTATIN SODIUM 20 MG PO TABS: 20.0000 mg | ORAL_TABLET | Freq: Every day | ORAL | Status: DC

## 2015-07-08 NOTE — Patient Instructions (Addendum)
   Decrease Pravastatin to  daily - may take 1/2 of your  tablet till finish current supply.  New  tab sent to [facility] pharmacy today. Continue all other medications.   Your physician has requested that you have a carotid duplex. This test is an ultrasound of the carotid arteries in your neck. It looks at blood flow through these arteries that supply the brain with blood. Allow one hour for this exam. There are no restrictions or special instructions. Office will contact with results via phone or letter.   Your physician wants you to follow up in: 6 months.  You will receive a reminder letter in the mail one-two months in advance.  If you don't receive a letter, please call our office to schedule the follow up appointment

## 2015-07-08 NOTE — Progress Notes (Signed)
Patient ID: [name], female   DOB: April 25, 1956, 59 y.o.   MRN: [id number]     Clinical Summary [name] is a 59 y.o.female seen today for follow up of the following medical problems.   1. CAD  - CABG 2005 4 vessel   last cath 05/2015 results as reported below, no targets for pci, recs for medical management. Was started on ranexa 553m bid, had headache on nitrates. She attributes that episode to severe stress, had lost a very close friend at that time. - denies any recent symptoms. Much improved with ranexa.   2. Carotid stenosis  - Carotid [place]2015 with moderate bilateral disease  - denies any neuro symptoms    3. HTN  - compliant meds   4. Hyperlipidemia  - compliant with statin  - 03/2014 lipid panel: TC 174 TG 293 HDL 36 LDL 79 - tried to change to crestor last visit, however she reports severe fatigue. Currently on pravastatin 481mdaily.  - reports muscle cramps she associates with statin. Cramps in thighs, calves, and feet. Happened with prior statins as well.    Past Medical History  Diagnosis Date  . Coronary artery disease     a. s/p prior CABG 2005. b. BMS 02/2009 to VG-PDA. c. NSTEMI 08/2010 s/p PTCA/DES to SVG-PDA in previously stented area. d. DES to SVG to RCA on 05/24/14  c. LHC 06/04/2015 w/ no sig change in her anatomy and Rx managment  . Hypertension   . Hyperlipidemia   . GERD (gastroesophageal reflux disease)   . Diverticulitis     4 documented episodes by CT; most recent April 2012  . Depression   . Anxiety   . Hypothyroidism   . COPD (chronic obstructive pulmonary disease)   . Asthma   . Sleep apnea     a. Mild-to-moderate obstructive sleep apnea diagnosed in April 2012 by sleep study.  . Anemia   . History of blood transfusion   . Tension headache   . Seizures   . Arthritis   . Chronic back pain   . DDD (degenerative disc disease)   . Kidney stones   . Acute renal failure   . Ischemic cardiomyopathy     a. EF 40% in 2012 by cath.  b. improved to 50-55% by cath 05/2014  [facility]  . Carotid artery disease     a. Duplex 50-69% BICA 03/2014.  . Marland Kitcheninus bradycardia   . HTN (hypertension)      Allergies  Allergen Reactions  . Adhesive [Tape] Itching and Other (See Comments)    Blisters (pls use paper tape)  . Crestor [Rosuvastatin] Other (See Comments)    Muscle pain  . Lipitor [Atorvastatin] Other (See Comments)    Cramps  . Morphine And Related Itching and Nausea And Vomiting    Taken off per patient request. States she "can take this"     Current Outpatient Prescriptions  Medication Sig Dispense Refill  . albuterol (PROAIR HFA) 108 (90 BASE) MCG/ACT inhaler Inhale 2 puffs into the lungs every 6 (six) hours as needed for wheezing or shortness of breath.    . Marland Kitchenlbuterol (PROVENTIL) (2.5 MG/3ML) 0.083% nebulizer solution Take 2.5 mg by nebulization every 6 (six) hours as needed for wheezing or shortness of breath.    . ALPRAZolam (XANAX) 1 MG tablet Take 1 mg by mouth 4 (four) times daily as needed for anxiety or sleep.     . Marland KitchenmLODipine (NORVASC) 10 MG  tablet Take 1 tablet (10 mg total) by mouth daily. 90 tablet 2  . aspirin 81 MG chewable tablet Chew 1 tablet (81 mg total) by mouth daily.    . benazepril (LOTENSIN) 20 MG tablet TAKE (1) TABLET BY MOUTH EACH MORNING. 30 tablet 6  . doxepin (SINEQUAN) 25 MG capsule Take 25 mg by mouth at bedtime.    Marland Kitchen EFFIENT 10 MG TABS tablet TAKE (1) TABLET BY MOUTH EACH MORNING. 30 tablet 0  . ezetimibe (ZETIA) 10 MG tablet Take 1 tablet (10 mg total) by mouth daily. 30 tablet 11  . furosemide (LASIX) 80 MG tablet TAKE ONE TABLET DAILY. CALL OFFICE AND MAKE AN APPOINTMENT. 30 tablet 0  . HYDROcodone-acetaminophen (NORCO) 10-325 MG per tablet Take 1 tablet by mouth every 4 (four) hours as needed for moderate pain (pain).     Marland Kitchen levothyroxine (SYNTHROID, LEVOTHROID) 75 MCG tablet Take 75 mcg by mouth daily before breakfast.     . methocarbamol (ROBAXIN) 500 MG tablet  Take 1 tablet (500 mg total) by mouth 2 (two) times daily as needed for muscle spasms. (Patient not taking: Reported on 06/03/2015) 10 tablet 0  . metoprolol succinate (TOPROL-XL) 50 MG 24 hr tablet TAKE (1) TABLET BY MOUTH EACH MORNING WITH OR IMMEDIATELY FOLLOWING AMEAL. 30 tablet 6  . Multiple Vitamin (MULTIVITAMIN WITH MINERALS) TABS tablet Take 1 tablet by mouth 2 (two) times daily. 25 tablet 4  . NITROSTAT 0.4 MG SL tablet DISSOLVE 1 TABLET UNDER TONGUE EVERY 5 MINUTES UP TO 15 MIN FOR CHESTPAIN. IF NO RELIEF CALL 911. 25 tablet 3  . omega-3 acid ethyl esters (LOVAZA) 1 G capsule Take 2 capsules (2 g total) by mouth 2 (two) times daily. 60 capsule 11  . omeprazole (PRILOSEC) 20 MG capsule TAKE ONE BY MOUTH CAPSULE DAILY. 30 capsule 3  . potassium chloride SA (K-DUR,KLOR-CON) 20 MEQ tablet TAKE (1) TABLET BY MOUTH EACH MORNING. (Patient taking differently: TAKE (1) TABLET BY MOUTH EACH MORNING. pt only takes with lasix prn) 30 tablet 11  . pravastatin (PRAVACHOL) 40 MG tablet Take 1 tablet (40 mg total) by mouth daily at 6 PM. 30 tablet 11  . ranolazine (RANEXA) 500 MG 12 hr tablet Take 1 tablet (500 mg total) by mouth 2 (two) times daily. 60 tablet 11  . [DISCONTINUED] carbamazepine (TEGRETOL XR) 200 MG 12 hr tablet Take 200 mg by mouth 2 (two) times daily.      . [DISCONTINUED] lamoTRIgine (LAMICTAL) 100 MG tablet Take 100 mg by mouth 2 (two) times daily.       No current facility-administered medications for this visit.     Past Surgical History  Procedure Laterality Date  . Coronary artery bypass graft  2005    LIMA to AL, SVG to LAD, SVG to Cx, SVG-PDA  . Carotid endarterectomy  2005  . Knee arthroscopy w/ acl reconstruction Left 2001  . Carpal tunnel with cubital tunnel Right 2001  . Coronary angioplasty with stent placement  2011    Promus Premier DES, 3.5 mm x 12 mm, for 99% proximal stent ISR in the SVG-RPDA   . Cardiac catheterization  Jan 2012    patent stents in RCA graft;  CAD stable  . Coronary angioplasty with stent placement  05/24/2014  . Appendectomy  ~ 1970  . Laparoscopic cholecystectomy  2010  . Vaginal hysterectomy  ~ 1993  . Tubal ligation  ~ 1989  . Left heart catheterization with coronary/graft angiogram N/A 05/24/2014  Procedure: LEFT HEART CATHETERIZATION WITH Beatrix Fetters;  Surgeon: Leonie Man, MD; EF 50-55%, LAD & D1 100%, LIMA-D1 OK, CFX OK, OM1 & OM2 100%, lat OM Cheryln Balcom 100%, RCA 100%; pSVG-RPDA 99% rx w/ scoring balloon PTCA & 3.5 x 12 mm Promus DES; SVG-LAD, SVG-OM known occluded      . Cardiac catheterization N/A 06/03/2015    Procedure: Left Heart Cath and Cors/Grafts Angiography;  Surgeon: Belva Crome, MD;  Location: [facility];  Service: Cardiovascular;  Laterality: N/A;     Allergies  Allergen Reactions  . Adhesive [Tape] Itching and Other (See Comments)    Blisters (pls use paper tape)  . Crestor [Rosuvastatin] Other (See Comments)    Muscle pain  . Lipitor [Atorvastatin] Other (See Comments)    Cramps  . Morphine And Related Itching and Nausea And Vomiting    Taken off per patient request. States she "can take this"      Family History  Problem Relation Age of Onset  . Heart attack Mother   . Heart disease Mother   . Breast cancer Mother   . Heart attack Father   . Heart disease Father   . Colon cancer Neg Hx      Social History [name] reports that she quit smoking about 18 months ago. Her smoking use included Cigarettes. She has a 10.5 pack-year smoking history. She has never used smokeless tobacco. [name] reports that she does not drink alcohol.   Review of Systems CONSTITUTIONAL: No weight loss, fever, chills, weakness or fatigue.  HEENT: Eyes: No visual loss, blurred vision, double vision or yellow sclerae.No hearing loss, sneezing, congestion, runny nose or sore throat.  SKIN: No rash or itching.  CARDIOVASCULAR: per HPI RESPIRATORY: No shortness of breath, cough or sputum.    GASTROINTESTINAL: No anorexia, nausea, vomiting or diarrhea. No abdominal pain or blood.  GENITOURINARY: No burning on urination, no polyuria NEUROLOGICAL: No headache, dizziness, syncope, paralysis, ataxia, numbness or tingling in the extremities. No change in bowel or bladder control.  MUSCULOSKELETAL: + muscle cramps LYMPHATICS: No enlarged nodes. No history of splenectomy.  PSYCHIATRIC: No history of depression or anxiety.  ENDOCRINOLOGIC: No reports of sweating, cold or heat intolerance. No polyuria or polydipsia.  Marland Kitchen   Physical Examination Filed Vitals:   07/08/15 1419  BP: 92/60  Pulse: 66   Filed Vitals:   07/08/15 1410  Height: 5' 3.5" (1.613 m)  Weight: 235 lb (106.595 kg)    Gen: resting comfortably, no acute distress HEENT: no scleral icterus, pupils equal round and reactive, no palptable cervical adenopathy,  CV: RRR, 2/6 systolic murmur RUSB, no jvd Resp: Clear to auscultation bilaterally GI: abdomen is soft, non-tender, non-distended, normal bowel sounds, no hepatosplenomegaly MSK: extremities are warm, no edema.  Skin: warm, no rash Neuro:  no focal deficits Psych: appropriate affect   Diagnostic Studies  Cath 2012  Left main coronary was normal.  Left anterior descending artery was occluded just after the septal  perforator. First diagonal Lajuan Kovaleski was subtotally occluded. There was  some filling of the distal LAD and diagonals from collateral branches  from the LIMA. Circumflex coronary artery was occluded in the  midvessel. The AV groove Ashlyne Olenick was patent. There was a small first  obtuse marginal Adaley Kiene with 30-40% multiple lesions. There was a small  second obtuse marginal Alban Marucci with 30-40% discrete lesions. There is a  very large obtuse marginal Gilmore List seen filling from collaterals from the  left internal mammary  artery.  Right coronary artery was 100% occluded proximally with both left-to-  right and right-to-right collaterals. The  distal PDA also filled from  the vein graft to the right coronary artery.  The left internal mammary artery was widely patent. It also filled the  distal right large obtuse marginal Amariyah Bazar and filled retrograde to a  smaller diagonal Hanford Lust.  There was known occlusion of the 2 left-sided vein grafts. The  saphenous vein graft to the right coronary artery was widely patent.  Bare-metal stent in the mid-to-distal vessel widely patent. There was  poor runoff to the vessel primarily being retrograde into the posterior  lateral Cherree Conerly, however, there was no new focal stenosis.  RAO ventriculography. RAO ventriculography showed anterior apical and  mid inferior wall hypokinesis. EF was in the 40% range.  Right heart catheterization showed mean pulmonary capillary wedge  pressure of 24, PA pressure was 55/26, RV pressure was 52/5, RA pressure  mean was 16, aortic pressure was 155/76, LV pressure was equal to 167/16  with a post A-wave EDP of 26.  Cardiac output was 6.3 liters per minute with a cardiac index of 3  liters per minute per meter squared by Fick.   IMPRESSION: The patient's coronary artery disease is stable. The  stents in the RCA graft were patent. There are no new lesions that I  can see. She does have collateralized distal right and OM Yassir Enis.  Continued medical therapy is warranted. If she continues to have chest  pain, Ranexa may be in order since it is somewhat late in the day and  she had both venous and arterial sheath. She will be kept overnight and  discharged in the morning. She tolerated the procedure well.   03/2014 Echo  Study Conclusions  - Left ventricle: The cavity size was normal. Wall thickness was increased in a pattern of mild LVH. Systolic function was normal. The estimated ejection fraction was in the range of 50% to 55%. There is akinesis of the basalinferolateral myocardium. Features are consistent with a pseudonormal left  ventricular filling pattern, with concomitant abnormal relaxation and increased filling pressure (grade 2 diastolic dysfunction). - Aortic valve: Mildly calcified annulus. Trileaflet. There was no significant regurgitation. - Mitral valve: There was trivial regurgitation. - Left atrium: The atrium was mildly to moderately dilated. - Right ventricle: Systolic function was mildly reduced. - Right atrium: Central venous pressure (est): 3 mm Hg. - Atrial septum: No defect or patent foramen ovale was identified. - Tricuspid valve: There was trivial regurgitation. - Pulmonary arteries: Systolic pressure could not be accurately estimated. - Pericardium, extracardiac: There was no pericardial effusion.  Impressions:  - Mild LVH with LVEF 50-55%, basal inferolateral hypokinesis, grade 2 diastolic dysfunction. Mild to moderate left atrial enlargement. Mildly reduced RV contraction. Unable to assess PASP.  03/2014 Carotid US  IMPRESSION:  Bilateral 50-69% stenosis. The left internal carotid peak systolic  velocity has increased slightly in the interval from the prior exam.  05/2014 Cath FINDINGS:  Hemodynamics:  1. Central Aortic Pressure / Mean: 110/62/81 mmHg 2. Left Ventricular Pressure / LVEDP: 110/14/21 mmHg  Left Ventriculography:  EF: 50 at 55 %  Wall Motion: Severe hypokinesis of the Basal to MID inferior-inferolateraL wall with mild hypokinesis of the basal anterior wall  Coronary Anatomy:  Dominance: Right  Left Main: Normal caliber vessel that itself is free of significant disease. Bifurcates into the LAD and Circumflex LAD: Moderate caliber vessel that is 100% occluded after a septal perforator and  small diagonal Darrow Barreiro. There is a first diagonal Kilah Drahos that is also half percent occluded after initial subtotal occlusion.  Left Circumflex: Moderate caliber vessel it gives rise to a proximal small caliber OM Morine Kohlman that is somewhat tortuous and free of significant  disease. The vessel then bifurcates into the introducer complex which is small in caliber and perfuses the basal inferolateral wall. There is a major lateral OM Vernel Donlan the visualized the proximal segment is occluded. There is late retrograde filling of the remainder the nature OM which appears to have 2 branches distally. This is faint collaterals from the proximal OM.   RCA: 100% proximal occluded. SVG-RPDA: Large-caliber graft with mildly irregular is proximally, there are overlapping stents in the distal mid graft with 99% napkin ring proximal in-stent restenosis of the most proximal stent that extends just minimally to the unstented segment. The remainder of the graft is relatively free of disease and attached to the mid RPDA.  RPDA: Large-caliber tortuous vessel that reaches almost to the apex.  RPL Sysytem:The RPAV fills via retrograde flow in the RPDA. There are several small posterolateral branches that are relatively free of disease. The PAV is relatively free of disease.  After reviewing the initial angiography, the culprit lesion was thought to be the 99% proximal stent ISR in the SVG-RPDA. Preparation were made to proceed with PCI on this lesion. As the lesion is focal & appears to be fibrous in nature and too stenosed to safely pass a filter wire, the decision was made to pre-dilate & stent without distal protection. No evidence of a filling defect was noted besides the "napkin-ring" lesion.  Percutaneous Coronary Intervention: Sheath exchanged for 6 Fr Guide: 6 Fr AL1Guidewire: BMW (after failed attempt with Pro-Water Predilation Balloon: Angioscult Cutting Balloon 3.5 mm x 10 mm;   12 Atm x 45 Sec,-- excellent predilation of the fibrous lesion at high pressure. Brisk TIMI-3 flow down stream but no sign of no reflow. The decision was made to proceed with stenting. Stent: Promus Premier DES 3.5 mm x 12 mm; overlaps roughly 4 mm into the original stent.  Deployed at:  20 Atm x 30 Sec Post-dilation Balloon: [place] Emerge 4.0 mm x 8 mm;   14 Atm x 30 Sec x 2 -   Final Diameter: 4.1 mm  Post deployment angiography in multiple views, with and without guidewire in place revealed excellent stent deployment and lesion coverage. There was no evidence of dissection or perforation. Brisk TIMI-3 flow is noted in the downstream vessel  MEDICATIONS:  Anesthesia: Local Lidocaine 2 ml  Sedation: 4 mg IV Versed, 100 mcg IV fentanyl ;   Omnipaque Contrast: 180 ml  Anticoagulation: IV Heparin 13500 Units total   Anti-Platelet Agent: Effient and aspirin as standing home medication  PATIENT DISPOSITION:   The patient was transferred to the PACU holding area in a hemodynamicaly stable, chest pain free condition.  The patient tolerated the procedure well, and there were no complications. EBL: < 5 ml  The patient was stable before, during, and after the procedure.  POST-OPERATIVE DIAGNOSIS:   Severe native CAD with 100% negative RCA, LAD & D1 along with OM 1 and OM 2 as previously described  Severe proximal edge in-stent restenosis of SVG-RPDA   Successful PCI of the proximal edge in-stent restenosis of SVG RCA with scoring balloon angioplasty followed by DES stent placement: Promus Premier 3.5 mm x 12 mm postdilated to 4.1 mm  Widely patent LIMA-diagonal with distal collaterals to what appears to  be the apical LAD.  Known occlusion of the SVG-LAD and SVG-OM  Relatively preserved LVEF as noted on the cardiogram recently. There is known hypokinesis to akinesis of the nasal to mid inferior inferolateral wall as well as mild hypokinesis of the basal anterior wall assistant with known CAD.  PLAN OF CARE:  Overnight observation post-PCI. Continue home doses of aspirin plus Effient.  Standard post radial cath care with care being removal   Discharge in the morning if stable.  Followup with Dr. Harl Bowie - continue to optimize medical management of  existing severe CAD.   05/2015 Cath  Dominance: Co-dominant   Left Anterior Descending   . Mid LAD lesion, 100% stenosed.   . First Diagonal Keeara Frees   The vessel is small in size.   . 1st Diag lesion, 99% stenosed.     Ramus Intermedius  The vessel is small .     Left Circumflex  The vessel is small .   Marland Kitchen Mid Cx to Dist Cx lesion, 100% stenosed.   . Second Obtuse Marginal Shakeitha Umbaugh   The vessel is small in size.   . Third Obtuse Marginal [place] Mastrogiovanni   3rd Mrg filled by collaterals from 1st Mrg.     Right Coronary Artery   . Prox RCA to Mid RCA lesion, 100% stenosed.   . Dist RCA lesion, 100% stenosed.     Graft Angiography    Free Graft to Dist LAD  SVG   . Prox Graft to Mid Graft lesion, 100% stenosed.     Free Graft to Dist RCA  SVG   . Mid Graft lesion, 30% stenosed. The lesion was previously treated with a stent (unknown type) .     Free Graft to 2nd Mrg  SVG   . Origin to Prox Graft lesion, 100% stenosed.     Free LIMA Graft to 2nd Diag  LIMA        Cath RECOMMENDATIONS:   Continued aggressive risk factor modification  No anatomy amenable to percutaneous intervention.                  Assessment and Plan  1. CAD  - no current symptoms, continue current meds  2. Carotid stenosis  - repeat carotid US  3. HTN  - at goal, continue current meds   4. Hyperlipidemia - side effects on pravastatin 45m daily, will try 254mdaily.   F/u 6 months.       JoArnoldo LenisM.D.

## 2015-07-09 ENCOUNTER — Ambulatory Visit (INDEPENDENT_AMBULATORY_CARE_PROVIDER_SITE_OTHER): Payer: Medicaid Other | Admitting: Orthopedic Surgery

## 2015-07-09 ENCOUNTER — Encounter: Payer: Self-pay | Admitting: Orthopedic Surgery

## 2015-07-09 VITALS — BP 81/42 | Ht 63.5 in | Wt 235.0 lb

## 2015-07-09 DIAGNOSIS — M65341 Trigger finger, right ring finger: Secondary | ICD-10-CM | POA: Diagnosis not present

## 2015-07-09 DIAGNOSIS — M1811 Unilateral primary osteoarthritis of first carpometacarpal joint, right hand: Secondary | ICD-10-CM | POA: Diagnosis not present

## 2015-07-09 DIAGNOSIS — M654 Radial styloid tenosynovitis [de Quervain]: Secondary | ICD-10-CM

## 2015-07-09 NOTE — Progress Notes (Signed)
Patient ID: [name], female   DOB: 10-03-1956, 59 y.o.   MRN: [id number]  Follow up visit  Chief Complaint  Patient presents with  . Hand Problem    follow up for right ring finger injection    BP 81/42 mmHg  Ht 5' 3.5" (1.613 m)  Wt 235 lb (106.595 kg)  BMI 40.97 kg/m2  No diagnosis found.  [name] is status post injection of the left first extensor compartment for de Quervain's and then the [facility] joint on the right for arthritis. She's here today for right ring finger injection 4 trigger finger  Trigger finger injection  Diagnosis  right ring finger triggering Procedure injection A1 pulley Medications lidocaine 1% 1 mL and Depo-Medrol 40 mg 1 mL Skin prep alcohol and ethyl chloride Verbal consent was obtained Timeout confirmed the injection site  After cleaning the skin with alcohol and anesthetizing the skin with ethyl chloride the A1 pulley was palpated and the injection was performed without complication   Continue bracing follow-up 6 weeks for [facility] joint arthritis on the right right ring finger triggering and first extensor compartment 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

## 2015-07-18 ENCOUNTER — Other Ambulatory Visit: Payer: Self-pay | Admitting: Cardiology

## 2015-08-08 ENCOUNTER — Other Ambulatory Visit: Payer: Self-pay | Admitting: Cardiology

## 2015-08-08 DIAGNOSIS — I6523 Occlusion and stenosis of bilateral carotid arteries: Secondary | ICD-10-CM

## 2015-08-14 ENCOUNTER — Ambulatory Visit (INDEPENDENT_AMBULATORY_CARE_PROVIDER_SITE_OTHER): Payer: Medicaid Other

## 2015-08-14 DIAGNOSIS — I6523 Occlusion and stenosis of bilateral carotid arteries: Secondary | ICD-10-CM

## 2015-08-16 ENCOUNTER — Telehealth: Payer: Self-pay | Admitting: *Deleted

## 2015-08-16 NOTE — Telephone Encounter (Signed)
Pt aware, routed to pcp 

## 2015-08-16 NOTE — Telephone Encounter (Signed)
-----   Message from Antoine PocheJonathan F Branch, MD sent at 08/16/2015 11:44 AM EST ----- Carotid US shows only moderate blockages on both sides. We will continue to monitor  Toni FerryJ Branch MD

## 2015-08-20 ENCOUNTER — Ambulatory Visit (INDEPENDENT_AMBULATORY_CARE_PROVIDER_SITE_OTHER): Payer: Medicaid Other

## 2015-08-20 ENCOUNTER — Encounter: Payer: Self-pay | Admitting: Orthopedic Surgery

## 2015-08-20 ENCOUNTER — Ambulatory Visit (INDEPENDENT_AMBULATORY_CARE_PROVIDER_SITE_OTHER): Payer: Medicaid Other | Admitting: Orthopedic Surgery

## 2015-08-20 VITALS — BP 92/60 | Ht 63.5 in | Wt 235.0 lb

## 2015-08-20 DIAGNOSIS — M25531 Pain in right wrist: Secondary | ICD-10-CM

## 2015-08-20 DIAGNOSIS — M654 Radial styloid tenosynovitis [de Quervain]: Secondary | ICD-10-CM | POA: Diagnosis not present

## 2015-08-20 NOTE — Progress Notes (Signed)
Follow-up visit  The patient has been treated for a [facility] arthritis on the right thumb a right ring trigger finger and a first extensor compartment 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  Comes in today with severe pain in the right first extensor compartment which is been present now for 1-2 weeks. Pain located over the radial side of the right wrist aching burning severe. She says it's hard for her to move her wrist.  Previous treatment includes bracing pain is now constant  Review of systems she denies any new trauma fever rash puncture wound  Past Medical History  Diagnosis Date  . Coronary artery disease     a. s/p prior CABG 2005. b. BMS 02/2009 to VG-PDA. c. NSTEMI 08/2010 s/p PTCA/DES to SVG-PDA in previously stented area. d. DES to SVG to RCA on 05/24/14  c. LHC 06/04/2015 w/ no sig change in her anatomy and Rx managment  . Hypertension   . Hyperlipidemia   . GERD (gastroesophageal reflux disease)   . Diverticulitis     4 documented episodes by CT; most recent April 2012  . Depression   . Anxiety   . Hypothyroidism   . COPD (chronic obstructive pulmonary disease) (HCC)   . Asthma   . Sleep apnea     a. Mild-to-moderate obstructive sleep apnea diagnosed in April 2012 by sleep study.  . Anemia   . History of blood transfusion   . Tension headache   . Seizures (HCC)   . Arthritis   . Chronic back pain   . DDD (degenerative disc disease)   . Kidney stones   . Acute renal failure (HCC)   . Ischemic cardiomyopathy     a. EF 40% in 2012 by cath. b. improved to 50-55% by cath 05/2014  c. [facility]  . Carotid artery disease (HCC)     a. Duplex 50-69% BICA 03/2014.  Marland Kitchen. Sinus bradycardia   . HTN (hypertension)    BP 92/60 mmHg  Ht 5' 3.5" (1.613 m)  Wt 235 lb (106.595 kg)  BMI 40.97 kg/m2 She has tenderness and swelling over the first extensor compartment with painful ulnar deviation limited flexion extension and ulnar deviation motor exam for extensor tendons normal  wrist remain stable though exam limited by pain neurovascular exam intact  The left wrist no swelling full range of motion has returned motor exam is normal wrist joint is stable neurovascular exam is intact  Imaging of the right wrist ordered and read in the office  My interpretation is that the wrist on the right is normal  Right de Quervain's syndrome  recommend injection    Verbal consent was obtained, timeout was taken to confirm site of injection. A steroid injection was performed at first extensor compartment right wrist using 1% plain Lidocaine and 40 mg of Depo-Medrol. This was well tolerated.  Left de Quervain's syndrome stable  Right ring finger trigger stable  Follow-up in 3-4 weeks if no improvement recommend extensor compartment release she will continue bracing

## 2015-08-31 ENCOUNTER — Emergency Department (HOSPITAL_COMMUNITY): Payer: Medicaid Other

## 2015-08-31 ENCOUNTER — Emergency Department (HOSPITAL_COMMUNITY)
Admission: EM | Admit: 2015-08-31 | Discharge: 2015-08-31 | Disposition: A | Discharge status: Home / Self Care | Payer: Medicaid Other | Attending: Emergency Medicine | Admitting: Emergency Medicine

## 2015-08-31 ENCOUNTER — Encounter (HOSPITAL_COMMUNITY): Payer: Self-pay

## 2015-08-31 DIAGNOSIS — Z79899 Other long term (current) drug therapy: Secondary | ICD-10-CM | POA: Insufficient documentation

## 2015-08-31 DIAGNOSIS — I252 Old myocardial infarction: Secondary | ICD-10-CM | POA: Diagnosis not present

## 2015-08-31 DIAGNOSIS — Y9289 Other specified places as the place of occurrence of the external cause: Secondary | ICD-10-CM | POA: Insufficient documentation

## 2015-08-31 DIAGNOSIS — F17210 Nicotine dependence, cigarettes, uncomplicated: Secondary | ICD-10-CM | POA: Insufficient documentation

## 2015-08-31 DIAGNOSIS — S6992XA Unspecified injury of left wrist, hand and finger(s), initial encounter: Secondary | ICD-10-CM | POA: Insufficient documentation

## 2015-08-31 DIAGNOSIS — S199XXA Unspecified injury of neck, initial encounter: Secondary | ICD-10-CM | POA: Diagnosis not present

## 2015-08-31 DIAGNOSIS — J449 Chronic obstructive pulmonary disease, unspecified: Secondary | ICD-10-CM | POA: Diagnosis not present

## 2015-08-31 DIAGNOSIS — S060X1A Concussion with loss of consciousness of 30 minutes or less, initial encounter: Secondary | ICD-10-CM | POA: Insufficient documentation

## 2015-08-31 DIAGNOSIS — E785 Hyperlipidemia, unspecified: Secondary | ICD-10-CM | POA: Diagnosis not present

## 2015-08-31 DIAGNOSIS — I10 Essential (primary) hypertension: Secondary | ICD-10-CM | POA: Insufficient documentation

## 2015-08-31 DIAGNOSIS — G8929 Other chronic pain: Secondary | ICD-10-CM | POA: Insufficient documentation

## 2015-08-31 DIAGNOSIS — F329 Major depressive disorder, single episode, unspecified: Secondary | ICD-10-CM | POA: Insufficient documentation

## 2015-08-31 DIAGNOSIS — S0083XA Contusion of other part of head, initial encounter: Secondary | ICD-10-CM | POA: Insufficient documentation

## 2015-08-31 DIAGNOSIS — Z87442 Personal history of urinary calculi: Secondary | ICD-10-CM | POA: Insufficient documentation

## 2015-08-31 DIAGNOSIS — S2020XA Contusion of thorax, unspecified, initial encounter: Secondary | ICD-10-CM | POA: Insufficient documentation

## 2015-08-31 DIAGNOSIS — I2510 Atherosclerotic heart disease of native coronary artery without angina pectoris: Secondary | ICD-10-CM | POA: Diagnosis not present

## 2015-08-31 DIAGNOSIS — F419 Anxiety disorder, unspecified: Secondary | ICD-10-CM | POA: Diagnosis not present

## 2015-08-31 DIAGNOSIS — Z862 Personal history of diseases of the blood and blood-forming organs and certain disorders involving the immune mechanism: Secondary | ICD-10-CM | POA: Insufficient documentation

## 2015-08-31 DIAGNOSIS — Y998 Other external cause status: Secondary | ICD-10-CM | POA: Insufficient documentation

## 2015-08-31 DIAGNOSIS — Z9861 Coronary angioplasty status: Secondary | ICD-10-CM | POA: Diagnosis not present

## 2015-08-31 DIAGNOSIS — E039 Hypothyroidism, unspecified: Secondary | ICD-10-CM | POA: Diagnosis not present

## 2015-08-31 DIAGNOSIS — S6991XA Unspecified injury of right wrist, hand and finger(s), initial encounter: Secondary | ICD-10-CM | POA: Diagnosis not present

## 2015-08-31 DIAGNOSIS — K219 Gastro-esophageal reflux disease without esophagitis: Secondary | ICD-10-CM | POA: Insufficient documentation

## 2015-08-31 DIAGNOSIS — M1990 Unspecified osteoarthritis, unspecified site: Secondary | ICD-10-CM | POA: Diagnosis not present

## 2015-08-31 DIAGNOSIS — Z7982 Long term (current) use of aspirin: Secondary | ICD-10-CM | POA: Insufficient documentation

## 2015-08-31 DIAGNOSIS — S0990XA Unspecified injury of head, initial encounter: Secondary | ICD-10-CM | POA: Diagnosis present

## 2015-08-31 DIAGNOSIS — Y9389 Activity, other specified: Secondary | ICD-10-CM | POA: Diagnosis not present

## 2015-08-31 DIAGNOSIS — Z951 Presence of aortocoronary bypass graft: Secondary | ICD-10-CM | POA: Diagnosis not present
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
## 2015-08-31 NOTE — ED Notes (Signed)
Patient with no complaints at this time. Respirations even and unlabored. Skin warm/dry. Discharge instructions reviewed with patient at this time. Patient given opportunity to voice concerns/ask questions. Patient discharged at this time and left Emergency Department with steady gait.   
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
## 2015-08-31 NOTE — Discharge Instructions (Signed)
Post-Concussion Syndrome Take your medications as prescribed. If you don't feel back to normal by next week, schedule appointment with Dr. Juanetta GoslingHawkins. Post-concussion syndrome is the symptoms that can occur after a head injury. These symptoms can last from weeks to months. HOME CARE   Take medicines only as told by your doctor.  Do not take aspirin.  Sleep with your head raised to help with headaches.  Avoid activities that can cause another head injury.  Do not play contact sports like football, hockey, soccer, or basketball.  Do not do other risky activities like downhill skiing, martial arts, or horseback riding until your doctor says it is okay.  Keep all follow-up visits as told by your doctor. This is important. GET HELP IF:   You have a harder time:  Paying attention.  Focusing.  Remembering.  Learning new information.  Dealing with stress.  You need more time to complete tasks.  You are easily bothered (irritable).  You have more symptoms. Get help if you have any of these symptoms for more than two weeks after your injury:   Long-lasting (chronic) headaches.  Dizziness.  Trouble balancing.  Feeling sick to your stomach (nauseous).  Trouble with your vision.  Noise or light bothers you more.  Depression.  Mood swings.  Feeling worried (anxious).  Easily bothered.  Memory problems.  Trouble concentrating or paying attention.  Sleep problems.  Feeling tired all of the time. GET HELP RIGHT AWAY IF:  You feel confused.  You feel very sleepy.  You are hard to wake up.  You feel sick to your stomach.  You keep throwing up (vomiting).  You feel like you are moving when you are not (vertigo).  Your eyes move back and forth very quickly.  You start shaking (convulsing) or pass out (faint).  You have very bad headaches that do not get better with medicine.  You cannot use your arms or legs like normal.  One of the black centers of your  eyes (pupils) is bigger than the other.  You have clear or bloody fluid coming from your nose or ears.  Your problems get worse, not better. MAKE SURE YOU:  Understand these instructions.  Will watch your condition.  Will get help right away if you are not doing well or get worse.   This information is not intended to replace advice given to you by your health care provider. Make sure you discuss any questions you have with your health care provider.   Document Released: 10/29/2004 Document Revised: 10/12/2014 Document Reviewed: 12/27/2013 Elsevier Interactive Patient Education [employer]2016 [employer]

## 2015-09-12 ENCOUNTER — Ambulatory Visit: Payer: Medicaid Other | Admitting: Orthopedic Surgery

## 2015-09-18 ENCOUNTER — Encounter: Payer: Self-pay | Admitting: Orthopedic Surgery

## 2015-09-18 ENCOUNTER — Ambulatory Visit (INDEPENDENT_AMBULATORY_CARE_PROVIDER_SITE_OTHER): Payer: Medicaid Other | Admitting: Orthopedic Surgery

## 2015-09-18 VITALS — BP 91/52 | Ht 63.0 in | Wt 230.0 lb

## 2015-09-18 DIAGNOSIS — M1811 Unilateral primary osteoarthritis of first carpometacarpal joint, right hand: Secondary | ICD-10-CM | POA: Diagnosis not present

## 2015-09-18 DIAGNOSIS — M25531 Pain in right wrist: Secondary | ICD-10-CM | POA: Diagnosis not present

## 2015-09-18 NOTE — Progress Notes (Signed)
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  Patient fell again over the Thanksgiving holiday  Complains of pain over the right [facility] and right wrist  Review of systems denies numbness or tingling  Exam BP 91/52 mmHg  Ht 5\' 3"  (1.6 m)  Wt 230 lb (104.327 kg)  BMI 40.75 kg/m2 She has tenderness over the wrist CMC joint and first extensor compartment painful ulnar deviation decreased range of motion normal extensor tendon function no wrist instability mild crepitance at the [facility] neurovascular exam intact  Recommend CMC joint injection secondary to posttraumatic inflammation  Under sterile conditions with the assistance of a focal chloride and alcohol to prep the skin and an appropriate timeout and verbal consent we injected the [facility] with 40 mg Depo-Medrol 3 mL 1% lidocaine tolerated well without consultation  Follow-up 4 weeks  Continue bracing

## 2015-09-23 ENCOUNTER — Other Ambulatory Visit: Payer: Self-pay | Admitting: Adult Health

## 2015-10-02 ENCOUNTER — Other Ambulatory Visit: Payer: Self-pay | Admitting: Adult Health

## 2015-10-07 ENCOUNTER — Other Ambulatory Visit: Payer: Self-pay | Admitting: Cardiology

## 2015-10-17 ENCOUNTER — Ambulatory Visit: Payer: Self-pay | Admitting: Orthopedic Surgery

## 2015-10-17 ENCOUNTER — Other Ambulatory Visit: Payer: Self-pay | Admitting: Adult Health

## 2015-11-11 ENCOUNTER — Telehealth: Payer: Self-pay | Admitting: *Deleted

## 2015-11-11 NOTE — Telephone Encounter (Signed)
Dr. Deirdre Peer office, (oral surgeon), is calling to follow up on the surgical request that was sent last week on this patient.

## 2015-11-12 NOTE — Telephone Encounter (Signed)
Message faxed to [facility] for update on status.

## 2015-11-12 NOTE — Telephone Encounter (Signed)
Sent off yesterday from [facility], should be available  [name] MD

## 2015-11-18 ENCOUNTER — Ambulatory Visit (INDEPENDENT_AMBULATORY_CARE_PROVIDER_SITE_OTHER): Payer: Medicaid Other | Admitting: Orthopedic Surgery

## 2015-11-18 ENCOUNTER — Encounter: Payer: Self-pay | Admitting: Orthopedic Surgery

## 2015-11-18 VITALS — BP 99/51 | Ht 63.0 in | Wt 223.0 lb

## 2015-11-18 DIAGNOSIS — M654 Radial styloid tenosynovitis [de Quervain]: Secondary | ICD-10-CM | POA: Diagnosis not present

## 2015-11-18 DIAGNOSIS — M65341 Trigger finger, right ring finger: Secondary | ICD-10-CM | POA: Diagnosis not present

## 2015-11-18 NOTE — Progress Notes (Signed)
Patient ID: [name], female   DOB: November 29, 1955, 60 y.o.   MRN: [id number]  Chief Complaint  Patient presents with  . Follow-up    follow up right knee s/p injection    BP 99/51 mmHg  Ht  (1.6 m)  Wt 223 lb (101.152 kg)  BMI 39.51 kg/m2  Pain continues right thumb de Quervain's syndrome pain right ring finger with catching locking consistent with trigger finger  We will inject both    ASSESSMENT AND PLAN   Ongoing de Quervain's syndrome right wrist ongoing triggering right ring finger  Trigger finger injection  Diagnosis  Right ring  Procedure injection A1 pulley Medications lidocaine 1% 1 mL and Depo-Medrol 40 mg 1 mL Skin prep alcohol and ethyl chloride Verbal consent was obtained Timeout confirmed the injection site  After cleaning the skin with alcohol and anesthetizing the skin with ethyl chloride the A1 pulley was palpated and the injection was performed without complication  Verbal consent timeout to confirm first extensor compartment as injection site  Steroid injection performed right first extensor compartment 1% plain lidocaine 40 mg Depo-Medrol well-tolerated no complications  Precautions given

## 2015-11-18 NOTE — Patient Instructions (Addendum)
You Can Quit Smoking If you are ready to quit smoking or are thinking about it, congratulations! You have chosen to help yourself be healthier and live longer! There are lots of different ways to quit smoking. Nicotine gum, nicotine patches, a nicotine inhaler, or nicotine nasal spray can help with physical craving. Hypnosis, support groups, and medicines help break the habit of smoking. TIPS TO GET OFF AND STAY OFF CIGARETTES  Learn to predict your moods. Do not let a bad situation be your excuse to have a cigarette. Some situations in your life might tempt you to have a cigarette.  Ask friends and co-workers not to smoke around you.  Make your home smoke-free.  Never have "just one" cigarette. It leads to wanting another and another. Remind yourself of your decision to quit.  On a card, make a list of your reasons for not smoking. Read it at least the same number of times a day as you have a cigarette. Tell yourself everyday, "I do not want to smoke. I choose not to smoke."  Ask someone at home or work to help you with your plan to quit smoking.  Have something planned after you eat or have a cup of coffee. Take a walk or get other exercise to perk you up. This will help to keep you from overeating.  Try a relaxation exercise to calm you down and decrease your stress. Remember, you may be tense and nervous the first two weeks after you quit. This will pass.  Find new activities to keep your hands busy. Play with a pen, coin, or rubber band. Doodle or draw things on paper.  Brush your teeth right after eating. This will help cut down the craving for the taste of tobacco after meals. You can try mouthwash too.  Try gum, breath mints, or diet candy to keep something in your mouth. IF YOU SMOKE AND WANT TO QUIT:  Do not stock up on cigarettes. Never buy a carton. Wait until one pack is finished before you buy another.  Never carry cigarettes with you at work or at home.  Keep cigarettes  as far away from you as possible. Leave them with someone else.  Never carry matches or a lighter with you.  Ask yourself, "Do I need this cigarette or is this just a reflex?"  Bet with someone that you can quit. Put cigarette money in a piggy bank every morning. If you smoke, you give up the money. If you do not smoke, by the end of the week, you keep the money.  Keep trying. It takes 21 days to change a habit!  Talk to your doctor about using medicines to help you quit. These include nicotine replacement gum, lozenges, or skin patches.   This information is not intended to replace advice given to you by your health care provider. Make sure you discuss any questions you have with your health care provider.   Document Released: 07/18/2009 Document Revised: 12/14/2011 Document Reviewed: 07/18/2009 Elsevier Interactive Patient Education 2016 [employer].  You have received an injection of steroids into the joint. 15% of patients will have increased pain within the 24 hours postinjection.   This is transient and will go away.   We recommend that you use ice packs on the injection site for 20 minutes every 2 hours and extra strength Tylenol 2 tablets every 8 as needed until the pain resolves.  If you continue to have pain after taking the Tylenol and using the  ice please call the office for further instructions.

## 2015-12-23 ENCOUNTER — Encounter: Payer: Self-pay | Admitting: Orthopedic Surgery

## 2015-12-23 ENCOUNTER — Ambulatory Visit (INDEPENDENT_AMBULATORY_CARE_PROVIDER_SITE_OTHER): Payer: Medicaid Other | Admitting: Orthopedic Surgery

## 2015-12-23 VITALS — BP 106/62 | Ht 63.0 in | Wt 223.0 lb

## 2015-12-23 DIAGNOSIS — M654 Radial styloid tenosynovitis [de Quervain]: Secondary | ICD-10-CM | POA: Diagnosis not present

## 2015-12-23 DIAGNOSIS — M65341 Trigger finger, right ring finger: Secondary | ICD-10-CM | POA: Diagnosis not present

## 2015-12-23 NOTE — Progress Notes (Signed)
Patient ID: [name], female   DOB: [birth year], 60 y.o.   MRN: [id number]  Chief Complaint  Patient presents with  . Follow-up    1 month follow up DQV + RT RING TRIGGER FINGER    HPI recheck right and left hand. Right ring finger triggering and right de Quervain's syndrome with left de Quervain's syndrome  Right side she is responding well to cortisone injections but we've had more than 2 at this point. I gave her the options of surgical versus nonsurgical treatment with continue bracing and injection with risk of tendon rupture  Review of Systems  Constitutional: Negative for fever and chills.  Musculoskeletal: Positive for joint pain.  Neurological: Negative for focal weakness.   Past Medical History  Diagnosis Date  . Coronary artery disease     a. s/p prior CABG 2005. b. BMS 02/2009 to VG-PDA. c. NSTEMI 08/2010 s/p PTCA/DES to SVG-PDA in previously stented area. d. DES to SVG to RCA on 05/24/14  c. LHC 06/04/2015 w/ no sig change in her anatomy and Rx managment  . Hypertension   . Hyperlipidemia   . GERD (gastroesophageal reflux disease)   . Diverticulitis     4 documented episodes by CT; most recent April 2012  . Depression   . Anxiety   . Hypothyroidism   . COPD (chronic obstructive pulmonary disease) (HCC)   . Asthma   . Sleep apnea     a. Mild-to-moderate obstructive sleep apnea diagnosed in April 2012 by sleep study.  . Anemia   . History of blood transfusion   . Tension headache   . Seizures (HCC)   . Arthritis   . Chronic back pain   . DDD (degenerative disc disease)   . Kidney stones   . Acute renal failure (HCC)   . Ischemic cardiomyopathy     a. EF 40% in 2012 by cath. b. improved to 50-55% by cath 05/2014  c[facility]  . Carotid artery disease (HCC)     a. Duplex 50-69% BICA 03/2014.  Marland Kitchen. Sinus bradycardia   . HTN (hypertension)      BP 106/62 mmHg  Ht 5\' 3"  (1.6 m)  Wt 223 lb (101.152 kg)  BMI 39.51 kg/m2  Physical Exam   Constitutional: She is oriented to person, place, and time. She appears well-developed and well-nourished. No distress.  Cardiovascular: Normal rate and intact distal pulses.   Neurological: She is alert and oriented to person, place, and time. She has normal reflexes. She exhibits normal muscle tone. Coordination normal.  Skin: Skin is warm and dry. No rash noted. She is not diaphoretic. No erythema. No pallor.  Psychiatric: She has a normal mood and affect. Her behavior is normal. Judgment and thought content normal.    Ortho Exam  Tenderness first extensor compartment right wrist and left wrist painful de Quervain's Finkelstein's test bilaterally neurovascular exam intact triggering and A1 pulley tenderness right ring finger  Neurovascular exam intact strength normal both wrists and hand  ASSESSMENT AND PLAN   De Quervain's syndrome right wrist De Quervain's syndrome left wrist Trigger finger right ring finger  Inject left de Quervain's first extensor compartment   Surgical release right first extensor compartment and right ring finger trigger    Procedure injection After obtaining consent, and timeout we injected the left first essential compartment with Depo-Medrol 41% lidocaine 3 mL tolerated well no count occasions alcohol and ethyl chloride prep

## 2015-12-23 NOTE — Addendum Note (Signed)
Addended by: Adella HareBOOTHE, Havoc Sanluis B on: 12/23/2015 05:40 PM   Modules accepted: Orders

## 2015-12-24 ENCOUNTER — Telehealth: Payer: Self-pay | Admitting: Cardiology

## 2015-12-24 NOTE — Telephone Encounter (Signed)
Pt aware and voiced understanding 

## 2015-12-24 NOTE — Telephone Encounter (Signed)
Pt is scheduled to have hand surgery on April 6th w/ Dr. Romeo AppleHarrison, pt would like to know when she needs to stop her Effient

## 2015-12-24 NOTE — Telephone Encounter (Signed)
Stop 7 days prior to surgery. Restart one day after. Its up to the surgeon if they would want her to stop aspirin also, that would also be 7 days before and start one day after   Dominga FerryJ Johannah Rozas MD

## 2015-12-30 ENCOUNTER — Ambulatory Visit: Payer: Self-pay | Admitting: Cardiology

## 2015-12-30 NOTE — Progress Notes (Unsigned)
Patient ID: [name], female   DOB: November 25, 1955, 60 y.o.   MRN: [id number]     Clinical Summary [name] is a 60 y.o.female  seen today for follow up of the following medical problems.    1. CAD  - CABG 2005 4 vessel  last cath 05/2015 results as reported below, no targets for pci, recs for medical management. Was started on ranexa 535m bid, had headache on nitrates. She attributes that episode to severe stress, had lost a very close friend at that time. - denies any recent symptoms. Much improved with ranexa.   2. Carotid stenosis  - Carotid [place]2015 with moderate bilateral disease  - denies any neuro symptoms    3. HTN  - compliant meds   4. Hyperlipidemia  - compliant with statin  - 03/2014 lipid panel: TC 174 TG 293 HDL 36 LDL 79 - tried to change to crestor last visit, however she reports severe fatigue. Currently on pravastatin 466mdaily.  - reports muscle cramps she associates with statin. Cramps in thighs, calves, and feet. Happened with prior statins as well.   5. Preoperative evaluation -she is being considered for an ortho procedure on her hand   - ok to stop effient prior.   Past Medical History  Diagnosis Date  . Coronary artery disease     a. s/p prior CABG 2005. b. BMS 02/2009 to VG-PDA. c. NSTEMI 08/2010 s/p PTCA/DES to SVG-PDA in previously stented area. d. DES to SVG to RCA on 05/24/14  c. LHC 06/04/2015 w/ no sig change in her anatomy and Rx managment  . Hypertension   . Hyperlipidemia   . GERD (gastroesophageal reflux disease)   . Diverticulitis     4 documented episodes by CT; most recent April 2012  . Depression   . Anxiety   . Hypothyroidism   . COPD (chronic obstructive pulmonary disease) ([facility]  . Asthma   . Sleep apnea     a. Mild-to-moderate obstructive sleep apnea diagnosed in April 2012 by sleep study.  . Anemia   . History of blood transfusion   . Tension headache   . Seizures ([facility] [place]  . Arthritis   . Chronic back pain     . DDD (degenerative disc disease)   . Kidney stones   . Acute renal failure ([facility]  . Ischemic cardiomyopathy     a. EF 40% in 2012 by cath. b. improved to 50-55% by cath 05/2014  [facility]  . Carotid artery disease ([facility]    a. Duplex 50-69% BICA 03/2014.  . Marland Kitcheninus bradycardia   . HTN (hypertension)      Allergies  Allergen Reactions  . Adhesive [Tape] Itching and Other (See Comments)    Blisters (pls use paper tape)  . Crestor [Rosuvastatin] Other (See Comments)    Muscle pain  . Lipitor [Atorvastatin] Other (See Comments)    Cramps     Current Outpatient Prescriptions  Medication Sig Dispense Refill  . albuterol (PROAIR HFA) 108 (90 BASE) MCG/ACT inhaler Inhale 2 puffs into the lungs every 6 (six) hours as needed for wheezing or shortness of breath.    . Marland Kitchenlbuterol (PROVENTIL) (2.5 MG/3ML) 0.083% nebulizer solution Take 2.5 mg by nebulization every 6 (six) hours as needed for wheezing or shortness of breath.    . ALPRAZolam (XANAX) 1 MG tablet Take 1 mg by mouth 4 (four) times daily as needed for anxiety or sleep.     .Marland Kitchen  amLODipine (NORVASC) 10 MG tablet TAKE ONE TABLET BY MOUTH ONCE DAILY. 30 tablet 6  . aspirin 81 MG chewable tablet Chew 1 tablet (81 mg total) by mouth daily.    . benazepril (LOTENSIN) 20 MG tablet TAKE (1) TABLET BY MOUTH EACH MORNING. 30 tablet 6  . doxepin (SINEQUAN) 25 MG capsule Take 25 mg by mouth at bedtime.    Marland Kitchen EFFIENT 10 MG TABS tablet TAKE (1) TABLET BY MOUTH EACH MORNING. 30 tablet 6  . furosemide (LASIX) 80 MG tablet TAKE ONE TABLET DAILY. CALL OFFICE AND MAKE AN APPOINTMENT. 30 tablet 3  . furosemide (LASIX) 80 MG tablet Take 80 mg by mouth daily as needed for fluid.    Marland Kitchen HYDROcodone-acetaminophen (NORCO) 10-325 MG per tablet Take 1 tablet by mouth every 4 (four) hours as needed for moderate pain (pain).     Marland Kitchen levETIRAcetam (KEPPRA) 250 MG tablet Take 250 mg by mouth 2 (two) times daily.    Marland Kitchen levothyroxine (SYNTHROID, LEVOTHROID)  75 MCG tablet Take 75 mcg by mouth daily before breakfast.     . lubiprostone (AMITIZA) 24 MCG capsule Take 24 mcg by mouth 2 (two) times daily with a meal.    . metoprolol succinate (TOPROL-XL) 50 MG 24 hr tablet TAKE (1) TABLET BY MOUTH EACH MORNING WITH OR IMMEDIATELY FOLLOWING AMEAL. 30 tablet 6  . Multiple Vitamin (MULTIVITAMIN WITH MINERALS) TABS tablet Take 1 tablet by mouth 2 (two) times daily. 25 tablet 4  . NITROSTAT 0.4 MG SL tablet DISSOLVE 1 TABLET UNDER TONGUE EVERY 5 MINUTES UP TO 15 MIN FOR CHESTPAIN. IF NO RELIEF CALL 911. 25 tablet 3  . omega-3 acid ethyl esters (LOVAZA) 1 G capsule Take 2 capsules (2 g total) by mouth 2 (two) times daily. 60 capsule 11  . omeprazole (PRILOSEC) 20 MG capsule TAKE ONE BY MOUTH CAPSULE DAILY. 30 capsule 3  . potassium chloride SA (K-DUR,KLOR-CON) 20 MEQ tablet Take 20 mEq by mouth daily as needed. When taking furosemide    . ranolazine (RANEXA) 500 MG 12 hr tablet Take 1 tablet (500 mg total) by mouth 2 (two) times daily. 60 tablet 11  . [DISCONTINUED] carbamazepine (TEGRETOL XR) 200 MG 12 hr tablet Take 200 mg by mouth 2 (two) times daily.      . [DISCONTINUED] lamoTRIgine (LAMICTAL) 100 MG tablet Take 100 mg by mouth 2 (two) times daily.       No current facility-administered medications for this visit.     Past Surgical History  Procedure Laterality Date  . Coronary artery bypass graft  2005    LIMA to AL, SVG to LAD, SVG to Cx, SVG-PDA  . Carotid endarterectomy  2005  . Knee arthroscopy w/ acl reconstruction Left 2001  . Carpal tunnel with cubital tunnel Right 2001  . Coronary angioplasty with stent placement  2011    Promus Premier DES, 3.5 mm x 12 mm, for 99% proximal stent ISR in the SVG-RPDA   . Cardiac catheterization  Jan 2012    patent stents in RCA graft; CAD stable  . Coronary angioplasty with stent placement  05/24/2014  . Appendectomy  ~ 1970  . Laparoscopic cholecystectomy  2010  . Vaginal hysterectomy  ~ 1993  . Tubal  ligation  ~ 1989  . Left heart catheterization with coronary/graft angiogram N/A 05/24/2014    Procedure: LEFT HEART CATHETERIZATION WITH Beatrix Fetters;  Surgeon: Leonie Man, MD; EF 50-55%, LAD & D1 100%, LIMA-D1 OK, CFX OK, OM1 & OM2  100%, lat OM Janard Culp 100%, RCA 100%; pSVG-RPDA 99% rx w/ scoring balloon PTCA & 3.5 x 12 mm Promus DES; SVG-LAD, SVG-OM known occluded      . Cardiac catheterization N/A 06/03/2015    Procedure: Left Heart Cath and Cors/Grafts Angiography;  Surgeon: Belva Crome, MD;  Location: [facility];  Service: Cardiovascular;  Laterality: N/A;     Allergies  Allergen Reactions  . Adhesive [Tape] Itching and Other (See Comments)    Blisters (pls use paper tape)  . Crestor [Rosuvastatin] Other (See Comments)    Muscle pain  . Lipitor [Atorvastatin] Other (See Comments)    Cramps      Family History  Problem Relation Age of Onset  . Heart attack Mother   . Heart disease Mother   . Breast cancer Mother   . Heart attack Father   . Heart disease Father   . Colon cancer Neg Hx      Social History [name] reports that she has been smoking Cigarettes.  She started smoking about 22 years ago. She has a 10.5 pack-year smoking history. She has never used smokeless tobacco. [name] reports that she does not drink alcohol.   Review of Systems CONSTITUTIONAL: No weight loss, fever, chills, weakness or fatigue.  HEENT: Eyes: No visual loss, blurred vision, double vision or yellow sclerae.No hearing loss, sneezing, congestion, runny nose or sore throat.  SKIN: No rash or itching.  CARDIOVASCULAR:  RESPIRATORY: No shortness of breath, cough or sputum.  GASTROINTESTINAL: No anorexia, nausea, vomiting or diarrhea. No abdominal pain or blood.  GENITOURINARY: No burning on urination, no polyuria NEUROLOGICAL: No headache, dizziness, syncope, paralysis, ataxia, numbness or tingling in the extremities. No change in bowel or bladder control.    MUSCULOSKELETAL: No muscle, back pain, joint pain or stiffness.  LYMPHATICS: No enlarged nodes. No history of splenectomy.  PSYCHIATRIC: No history of depression or anxiety.  ENDOCRINOLOGIC: No reports of sweating, cold or heat intolerance. No polyuria or polydipsia.  Marland Kitchen   Physical Examination There were no vitals filed for this visit. There were no vitals filed for this visit.  Gen: resting comfortably, no acute distress HEENT: no scleral icterus, pupils equal round and reactive, no palptable cervical adenopathy,  CV Resp: Clear to auscultation bilaterally GI: abdomen is soft, non-tender, non-distended, normal bowel sounds, no hepatosplenomegaly MSK: extremities are warm, no edema.  Skin: warm, no rash Neuro:  no focal deficits Psych: appropriate affect   Diagnostic Studies Cath 2012  Left main coronary was normal.  Left anterior descending artery was occluded just after the septal  perforator. First diagonal Rain Friedt was subtotally occluded. There was  some filling of the distal LAD and diagonals from collateral branches  from the LIMA. Circumflex coronary artery was occluded in the  midvessel. The AV groove Wadsworth Skolnick was patent. There was a small first  obtuse marginal Murle Otting with 30-40% multiple lesions. There was a small  second obtuse marginal Elyas Villamor with 30-40% discrete lesions. There is a  very large obtuse marginal Asahd Can seen filling from collaterals from the  left internal mammary artery.  Right coronary artery was 100% occluded proximally with both left-to-  right and right-to-right collaterals. The distal PDA also filled from  the vein graft to the right coronary artery.  The left internal mammary artery was widely patent. It also filled the  distal right large obtuse marginal Shenita Trego and filled retrograde to a  smaller diagonal Peaches Vanoverbeke.  There was known occlusion of the 2  left-sided vein grafts. The  saphenous vein graft to the right coronary artery  was widely patent.  Bare-metal stent in the mid-to-distal vessel widely patent. There was  poor runoff to the vessel primarily being retrograde into the posterior  lateral Findley Blankenbaker, however, there was no new focal stenosis.  RAO ventriculography. RAO ventriculography showed anterior apical and  mid inferior wall hypokinesis. EF was in the 40% range.  Right heart catheterization showed mean pulmonary capillary wedge  pressure of 24, PA pressure was 55/26, RV pressure was 52/5, RA pressure  mean was 16, aortic pressure was 155/76, LV pressure was equal to 167/16  with a post A-wave EDP of 26.  Cardiac output was 6.3 liters per minute with a cardiac index of 3  liters per minute per meter squared by Fick.   IMPRESSION: The patient's coronary artery disease is stable. The  stents in the RCA graft were patent. There are no new lesions that I  can see. She does have collateralized distal right and OM Shivaun Bilello.  Continued medical therapy is warranted. If she continues to have chest  pain, Ranexa may be in order since it is somewhat late in the day and  she had both venous and arterial sheath. She will be kept overnight and  discharged in the morning. She tolerated the procedure well.   03/2014 Echo  Study Conclusions  - Left ventricle: The cavity size was normal. Wall thickness was increased in a pattern of mild LVH. Systolic function was normal. The estimated ejection fraction was in the range of 50% to 55%. There is akinesis of the basalinferolateral myocardium. Features are consistent with a pseudonormal left ventricular filling pattern, with concomitant abnormal relaxation and increased filling pressure (grade 2 diastolic dysfunction). - Aortic valve: Mildly calcified annulus. Trileaflet. There was no significant regurgitation. - Mitral valve: There was trivial regurgitation. - Left atrium: The atrium was mildly to moderately dilated. - Right ventricle: Systolic function  was mildly reduced. - Right atrium: Central venous pressure (est): 3 mm Hg. - Atrial septum: No defect or patent foramen ovale was identified. - Tricuspid valve: There was trivial regurgitation. - Pulmonary arteries: Systolic pressure could not be accurately estimated. - Pericardium, extracardiac: There was no pericardial effusion.  Impressions:  - Mild LVH with LVEF 50-55%, basal inferolateral hypokinesis, grade 2 diastolic dysfunction. Mild to moderate left atrial enlargement. Mildly reduced RV contraction. Unable to assess PASP.  03/2014 Carotid US  IMPRESSION:  Bilateral 50-69% stenosis. The left internal carotid peak systolic  velocity has increased slightly in the interval from the prior exam.  05/2014 Cath FINDINGS:  Hemodynamics:  1. Central Aortic Pressure / Mean: 110/62/81 mmHg 2. Left Ventricular Pressure / LVEDP: 110/14/21 mmHg  Left Ventriculography:  EF: 50 at 55 %  Wall Motion: Severe hypokinesis of the Basal to MID inferior-inferolateraL wall with mild hypokinesis of the basal anterior wall  Coronary Anatomy:  Dominance: Right  Left Main: Normal caliber vessel that itself is free of significant disease. Bifurcates into the LAD and Circumflex LAD: Moderate caliber vessel that is 100% occluded after a septal perforator and small diagonal Annalynn Centanni. There is a first diagonal Adara Kittle that is also half percent occluded after initial subtotal occlusion.  Left Circumflex: Moderate caliber vessel it gives rise to a proximal small caliber OM Blaklee Shores that is somewhat tortuous and free of significant disease. The vessel then bifurcates into the introducer complex which is small in caliber and perfuses the basal inferolateral wall. There is a major lateral OM  Czarina Gingras the visualized the proximal segment is occluded. There is late retrograde filling of the remainder the nature OM which appears to have 2 branches distally. This is faint collaterals from the proximal  OM.   RCA: 100% proximal occluded. SVG-RPDA: Large-caliber graft with mildly irregular is proximally, there are overlapping stents in the distal mid graft with 99% napkin ring proximal in-stent restenosis of the most proximal stent that extends just minimally to the unstented segment. The remainder of the graft is relatively free of disease and attached to the mid RPDA.  RPDA: Large-caliber tortuous vessel that reaches almost to the apex.  RPL Sysytem:The RPAV fills via retrograde flow in the RPDA. There are several small posterolateral branches that are relatively free of disease. The PAV is relatively free of disease.  After reviewing the initial angiography, the culprit lesion was thought to be the 99% proximal stent ISR in the SVG-RPDA. Preparation were made to proceed with PCI on this lesion. As the lesion is focal & appears to be fibrous in nature and too stenosed to safely pass a filter wire, the decision was made to pre-dilate & stent without distal protection. No evidence of a filling defect was noted besides the "napkin-ring" lesion.  Percutaneous Coronary Intervention: Sheath exchanged for 6 Fr Guide: 6 Fr AL1Guidewire: BMW (after failed attempt with Pro-Water Predilation Balloon: Angioscult Cutting Balloon 3.5 mm x 10 mm;   12 Atm x 45 Sec,-- excellent predilation of the fibrous lesion at high pressure. Brisk TIMI-3 flow down stream but no sign of no reflow. The decision was made to proceed with stenting. Stent: Promus Premier DES 3.5 mm x 12 mm; overlaps roughly 4 mm into the original stent.  Deployed at: 20 Atm x 30 Sec Post-dilation Balloon: [place] Emerge 4.0 mm x 8 mm;   14 Atm x 30 Sec x 2 -   Final Diameter: 4.1 mm  Post deployment angiography in multiple views, with and without guidewire in place revealed excellent stent deployment and lesion coverage. There was no evidence of dissection or perforation. Brisk TIMI-3 flow is noted in the downstream  vessel  MEDICATIONS:  Anesthesia: Local Lidocaine 2 ml  Sedation: 4 mg IV Versed, 100 mcg IV fentanyl ;   Omnipaque Contrast: 180 ml  Anticoagulation: IV Heparin 13500 Units total   Anti-Platelet Agent: Effient and aspirin as standing home medication  PATIENT DISPOSITION:   The patient was transferred to the PACU holding area in a hemodynamicaly stable, chest pain free condition.  The patient tolerated the procedure well, and there were no complications. EBL: < 5 ml  The patient was stable before, during, and after the procedure.  POST-OPERATIVE DIAGNOSIS:   Severe native CAD with 100% negative RCA, LAD & D1 along with OM 1 and OM 2 as previously described  Severe proximal edge in-stent restenosis of SVG-RPDA   Successful PCI of the proximal edge in-stent restenosis of SVG RCA with scoring balloon angioplasty followed by DES stent placement: Promus Premier 3.5 mm x 12 mm postdilated to 4.1 mm  Widely patent LIMA-diagonal with distal collaterals to what appears to be the apical LAD.  Known occlusion of the SVG-LAD and SVG-OM  Relatively preserved LVEF as noted on the cardiogram recently. There is known hypokinesis to akinesis of the nasal to mid inferior inferolateral wall as well as mild hypokinesis of the basal anterior wall assistant with known CAD.  PLAN OF CARE:  Overnight observation post-PCI. Continue home doses of aspirin plus Effient.  Standard post radial  cath care with care being removal   Discharge in the morning if stable.  Followup with Dr. Harl Bowie - continue to optimize medical management of existing severe CAD.   05/2015 Cath  Dominance: Co-dominant   Left Anterior Descending   . Mid LAD lesion, 100% stenosed.   . First Diagonal [place] Marney   The vessel is small in size.   . 1st Diag lesion, 99% stenosed.     Ramus Intermedius  The vessel is small .     Left Circumflex   The vessel is small .   Marland Kitchen Mid Cx to Dist Cx lesion, 100% stenosed.   . Second Obtuse Marginal Daryn Pisani   The vessel is small in size.   . Third Obtuse Marginal Jolleen Seman   3rd Mrg filled by collaterals from 1st Mrg.     Right Coronary Artery   . Prox RCA to Mid RCA lesion, 100% stenosed.   . Dist RCA lesion, 100% stenosed.     Graft Angiography    Free Graft to Dist LAD  SVG   . Prox Graft to Mid Graft lesion, 100% stenosed.     Free Graft to Dist RCA  SVG   . Mid Graft lesion, 30% stenosed. The lesion was previously treated with a stent (unknown type) .     Free Graft to 2nd Mrg  SVG   . Origin to Prox Graft lesion, 100% stenosed.     Free LIMA Graft to 2nd Diag  LIMA        Cath RECOMMENDATIONS:   Continued aggressive risk factor modification  No anatomy amenable to percutaneous intervention.                         Assessment and Plan   1. CAD  - no current symptoms, continue current meds  2. Carotid stenosis  - repeat carotid US  3. HTN  - at goal, continue current meds   4. Hyperlipidemia - side effects on pravastatin 51m daily, will try 237mdaily.   F/u 6 months.      JoArnoldo LenisM.D., F.A.C.C.

## 2016-01-03 NOTE — Pre-Procedure Instructions (Signed)
Spoke with Dr Verna CzechBranch's office about effient. They talked to Doctor and they told patient to hold effient starting 12/30/2015. Called patient at home and she verbalizes understanding of this and did stop effient on 12/30/2015. Called Dr Harrison's office ad spoke with Asher MuirJamie, who spoke with Dr Romeo AppleHarrison. Dr Romeo AppleHarrison does not need any coag [facility] or morning of surgery.

## 2016-01-03 NOTE — Patient Instructions (Signed)
[name]  01/03/2016     @   Your procedure is scheduled on  01/09/2016   Report to [name] at   615  A.M.  Call this number if you have problems the morning of surgery:  [phone]   Remember:  Do not eat food or drink liquids after midnight.  Take these medicines the morning of surgery with A SIP OF WATER  Xanax, norvasc, benazepril, hydrocodone, keppra, synthroid, metoprolol, ranexa. Do your nebulizer before you come and bring your inhaler with you when you come.   Do not wear jewelry, make-up or nail polish.  Do not wear lotions, powders, or perfumes.  You may wear deodorant.  Do not shave 48 hours prior to surgery.  Men may shave face and neck.  Do not bring valuables to the hospital.  [facility] is not responsible for any belongings or valuables.  Contacts, dentures or bridgework may not be worn into surgery.  Leave your suitcase in the car.  After surgery it may be brought to your room.  For patients admitted to the hospital, discharge time will be determined by your treatment team.  Patients discharged the day of surgery will not be allowed to drive home.   Name and phone number of your driver:   family Special instructions:  none  Please read over the following fact sheets that you were given. Coughing and Deep Breathing, Surgical Site Infection Prevention, Anesthesia Post-op Instructions and Care and Recovery After Surgery      Trigger Finger Trigger finger (digital tendinitis and stenosing tenosynovitis) is a common disorder that causes an often painful catching of the fingers or thumb. It occurs as a clicking, snapping, or locking of a finger in the palm of the hand. This is caused by a problem with the tendons that flex or bend the fingers sliding smoothly through their sheaths. The condition may occur in any finger or a couple fingers at the same time.  The finger may lock with the finger curled or suddenly straighten  out with a snap. This is more common in patients with rheumatoid arthritis and diabetes. Left untreated, the condition may get worse to the point where the finger becomes locked in flexion, like making a fist, or less commonly locked with the finger straightened out. CAUSES   Inflammation and scarring that lead to swelling around the tendon sheath.  Repeated or forceful movements.  Rheumatoid arthritis, an autoimmune disease that affects joints.  Gout.  Diabetes mellitus. SIGNS AND SYMPTOMS  Soreness and swelling of your finger.  A painful clicking or snapping as you bend and straighten your finger. DIAGNOSIS  Your health care provider will do a physical exam of your finger to diagnose trigger finger. TREATMENT   Splinting for 6-8 weeks may be helpful.  Nonsteroidal anti-inflammatory medicines (NSAIDs) can help to relieve the pain and inflammation.  Cortisone injections, along with splinting, may speed up recovery. Several injections may be required. Cortisone may give relief after one injection.  Surgery is another treatment that may be used if conservative treatments do not work. Surgery can be minor, without incisions (a cut does not have to be made), and can be done with a needle through the skin.  Other surgical choices involve an open procedure in which the surgeon opens the hand through a small incision and cuts the pulley so the tendon can again slide smoothly. Your hand will still work fine. HOME CARE  INSTRUCTIONS  Apply ice to the injured area, twice per day:  Put ice in a plastic bag.  Place a towel between your skin and the bag.  Leave the ice on for 20 minutes, 3-4 times a day.  Rest your hand often. MAKE SURE YOU:   Understand these instructions.  Will watch your condition.  Will get help right away if you are not doing well or get worse.   This information is not intended to replace advice given to you by your health care provider. Make sure you discuss  any questions you have with your health care provider.   Document Released: 07/11/2004 Document Revised: 05/24/2013 Document Reviewed: 02/21/2013 Elsevier Interactive Patient Education 2016 [employer] [name] Disease [name] disease is inflammation of the tendon on the thumb side of the wrist. Tendons are cords of tissue that connect bones to muscles. The tendons in your hand pass through a tunnel, or sheath. A slippery layer of tissue (synovium) lets the tendons move smoothly in the sheath. With de Quervain disease, the sheath swells or thickens, causing friction and pain. The condition is also called de Quervain tendinosis and de Quervain syndrome. It occurs most often in women who are 26-48 years old. CAUSES  The exact cause of de Quervain disease is not known. It may result from:   Overusing your hands, especially with repetitive motions that involve twisting your hand or using a forceful grip.  Pregnancy.  Rheumatoid disease. RISK FACTORS You may have a greater risk for de Quervain disease if you:  Are a middle-aged woman.  Are pregnant.  Have rheumatoid arthritis.  Have diabetes.  Use your hands far more than normal, especially with a tight grip or excessive twisting. SIGNS AND SYMPTOMS Pain on the thumb side of your wrist is the main symptom of de Quervain disease. Other signs and symptoms include:  Pain that gets worse when you grasp something or turn your wrist.  Pain that extends up the forearm.  Cysts in the area of the pain.  Swelling of your wrist and hand.  A sensation of snapping in the wrist.  Trouble moving the thumb and wrist. DIAGNOSIS  Your health care provider may diagnose de Quervain disease based on your signs and symptoms. A physical exam will also be done. A simple test [name] test) that involves pulling your thumb and wrist to see if this causes pain can help determine whether you have the condition. Sometimes you may need to have  an X-ray.  TREATMENT  Avoiding any activity that causes pain and swelling is the best treatment. Other options include:  Wearing a splint.  Taking medicine. Anti-inflammatory medicines and corticosteroid injections may reduce inflammation and relieve pain.  Having surgery if other treatments do not work. HOME CARE INSTRUCTIONS   Using ice can be helpful after doing activities that involve the sore wrist. To apply ice to the injured area:  Put ice in a plastic bag.  Place a towel between your skin and the bag.  Leave the ice on for 20 minutes, 2-3 times a day.  Take medicines only as directed by your health care provider.  Wear your splint as directed. This will allow your hand to rest and heal. SEEK MEDICAL CARE IF:   Your pain medicine does not help.   Your pain gets worse.  You develop new symptoms. MAKE SURE YOU:   Understand these instructions.  Will watch your condition.  Will get help right away if you are  not doing well or get worse.   This information is not intended to replace advice given to you by your health care provider. Make sure you discuss any questions you have with your health care provider.   Document Released: 06/16/2001 Document Revised: 10/12/2014 Document Reviewed: 01/24/2014 Elsevier Interactive Patient Education 2016 [employer] PATIENT INSTRUCTIONS POST-ANESTHESIA  IMMEDIATELY FOLLOWING SURGERY:  Do not drive or operate machinery for the first twenty four hours after surgery.  Do not make any important decisions for twenty four hours after surgery or while taking narcotic pain medications or sedatives.  If you develop intractable nausea and vomiting or a severe headache please notify your doctor immediately.  FOLLOW-UP:  Please make an appointment with your surgeon as instructed. You do not need to follow up with anesthesia unless specifically instructed to do so.  WOUND CARE INSTRUCTIONS (if applicable):  Keep a dry clean dressing on the  anesthesia/puncture wound site if there is drainage.  Once the wound has quit draining you may leave it open to air.  Generally you should leave the bandage intact for twenty four hours unless there is drainage.  If the epidural site drains for more than 36-48 hours please call the anesthesia department.  QUESTIONS?:  Please feel free to call your physician or the hospital operator if you have any questions, and they will be happy to assist you.

## 2016-01-06 ENCOUNTER — Ambulatory Visit: Payer: Self-pay | Admitting: Cardiology

## 2016-01-06 ENCOUNTER — Encounter (HOSPITAL_COMMUNITY)
Admission: RE | Admit: 2016-01-06 | Discharge: 2016-01-06 | Disposition: A | Discharge status: Home / Self Care | Payer: Medicaid Other | Source: Ambulatory Visit | Attending: Orthopedic Surgery | Admitting: Orthopedic Surgery

## 2016-01-06 ENCOUNTER — Telehealth: Payer: Self-pay

## 2016-01-06 NOTE — Telephone Encounter (Signed)
Pt called and will not be able to go to her pre-op appt today. She is sick and can not stay out of the bathroom. Please call her.  She is not sure she will be able to have her surgery on Thursday.

## 2016-01-06 NOTE — Telephone Encounter (Signed)
Cancelled surgery per patient's request,due to sickness, [facility]

## 2016-01-09 ENCOUNTER — Encounter (HOSPITAL_COMMUNITY): Admission: RE | Payer: Self-pay | Source: Ambulatory Visit

## 2016-01-09 ENCOUNTER — Ambulatory Visit (HOSPITAL_COMMUNITY): Admission: RE | Admit: 2016-01-09 | Payer: Medicaid Other | Source: Ambulatory Visit | Admitting: Orthopedic Surgery

## 2016-01-09 SURGERY — RELEASE, FIRST DORSAL COMPARTMENT, HAND
Anesthesia: Choice | Laterality: Right

## 2016-01-20 ENCOUNTER — Ambulatory Visit: Payer: Self-pay | Admitting: Orthopedic Surgery

## 2016-02-04 ENCOUNTER — Other Ambulatory Visit: Payer: Self-pay | Admitting: Adult Health

## 2016-02-07 ENCOUNTER — Ambulatory Visit (INDEPENDENT_AMBULATORY_CARE_PROVIDER_SITE_OTHER): Payer: Medicaid Other | Admitting: Cardiology

## 2016-02-07 ENCOUNTER — Encounter: Payer: Self-pay | Admitting: Cardiology

## 2016-02-07 VITALS — BP 104/69 | HR 65 | Ht 63.5 in | Wt 216.0 lb

## 2016-02-07 DIAGNOSIS — I6529 Occlusion and stenosis of unspecified carotid artery: Secondary | ICD-10-CM

## 2016-02-07 DIAGNOSIS — E039 Hypothyroidism, unspecified: Secondary | ICD-10-CM | POA: Diagnosis not present

## 2016-02-07 DIAGNOSIS — E785 Hyperlipidemia, unspecified: Secondary | ICD-10-CM | POA: Diagnosis not present

## 2016-02-07 DIAGNOSIS — I10 Essential (primary) hypertension: Secondary | ICD-10-CM | POA: Diagnosis not present

## 2016-02-07 DIAGNOSIS — R7309 Other abnormal glucose: Secondary | ICD-10-CM

## 2016-02-07 DIAGNOSIS — I2510 Atherosclerotic heart disease of native coronary artery without angina pectoris: Secondary | ICD-10-CM

## 2016-02-07 MED ORDER — BENAZEPRIL HCL 10 MG PO TABS: 10.0000 mg | ORAL_TABLET | Freq: Every day | ORAL | Status: DC

## 2016-02-07 NOTE — Progress Notes (Addendum)
Patient ID: ALLECIA BELLS, female   DOB: 11-Dec-1955, 60 y.o.   MRN: [id number]     Clinical Summary Ms. Wack is a 60 y.o.female seen today for follow up of the following medical problems.    1. CAD  - CABG 2005 4 vessel  last cath 05/2015 results as reported below, no targets for pci, recs for medical management. Was started on ranexa 549m bid, had headache on nitrates. She attributes that episode to severe stress, had lost a very close friend at that time.   - denies any chest pain since starting ranexa. No SOB or DOE, she has lost 23 lbs since Nov 2016 - compliant with meds  2. Carotid stenosis  - Carotid [place] 40-59% bilateral - denies any recent neuro symptoms    3. HTN  - compliant meds  - can have some lightheadness and dizziness, occurs with standing.   4. Hyperlipidemia  - compliant with statin  - 03/2014 lipid panel: TC 174 TG 293 HDL 36 LDL 79 - did not tolerate crestor, prava, or lovastatin due to side effects.    5. Preoperative evaluation -she is being considered for an ortho procedure on her hand Past Medical History  Diagnosis Date  . Coronary artery disease     a. s/p prior CABG 2005. b. BMS 02/2009 to VG-PDA. c. NSTEMI 08/2010 s/p PTCA/DES to SVG-PDA in previously stented area. d. DES to SVG to RCA on 05/24/14  c. LHC 06/04/2015 w/ no sig change in her anatomy and Rx managment  . Hypertension   . Hyperlipidemia   . GERD (gastroesophageal reflux disease)   . Diverticulitis     4 documented episodes by CT; most recent April 2012  . Depression   . Anxiety   . Hypothyroidism   . COPD (chronic obstructive pulmonary disease) ([place]   . Asthma   . Sleep apnea     a. Mild-to-moderate obstructive sleep apnea diagnosed in April 2012 by sleep study.  . Anemia   . History of blood transfusion   . Tension headache   . Seizures ([place]   . Arthritis   . Chronic back pain   . DDD (degenerative disc disease)   . Kidney stones   . Acute renal failure  ([place]   . Ischemic cardiomyopathy     a. EF 40% in 2012 by cath. b. improved to 50-55% by cath 05/2014  [facility]  . Carotid artery disease ([place]     a. Duplex 50-69% BICA 03/2014.  .Marland KitchenSinus bradycardia   . HTN (hypertension)      Allergies  Allergen Reactions  . Adhesive [Tape] Itching and Other (See Comments)    Blisters (pls use paper tape)  . Crestor [Rosuvastatin] Other (See Comments)    Muscle pain  . Lipitor [Atorvastatin] Other (See Comments)    Cramps     Current Outpatient Prescriptions  Medication Sig Dispense Refill  . albuterol (PROAIR HFA) 108 (90 BASE) MCG/ACT inhaler Inhale 2 puffs into the lungs every 6 (six) hours as needed for wheezing or shortness of breath.    .Marland Kitchenalbuterol (PROVENTIL) (2.5 MG/3ML) 0.083% nebulizer solution Take 2.5 mg by nebulization every 6 (six) hours as needed for wheezing or shortness of breath.    . ALPRAZolam (XANAX) 1 MG tablet Take 1 mg by mouth 4 (four) times daily as needed for anxiety or sleep.     .Marland KitchenamLODipine (NORVASC) 10 MG tablet TAKE ONE TABLET BY MOUTH ONCE  DAILY. 30 tablet 6  . aspirin 81 MG chewable tablet Chew 1 tablet (81 mg total) by mouth daily.    . benazepril (LOTENSIN) 20 MG tablet TAKE (1) TABLET BY MOUTH EACH MORNING. 30 tablet 6  . doxepin (SINEQUAN) 25 MG capsule Take 25 mg by mouth at bedtime.    Marland Kitchen EFFIENT 10 MG TABS tablet TAKE (1) TABLET BY MOUTH EACH MORNING. 30 tablet 6  . furosemide (LASIX) 80 MG tablet TAKE ONE TABLET DAILY. CALL OFFICE AND MAKE AN APPOINTMENT. 30 tablet 3  . furosemide (LASIX) 80 MG tablet Take 80 mg by mouth daily as needed for fluid.    Marland Kitchen HYDROcodone-acetaminophen (NORCO) 10-325 MG per tablet Take 1 tablet by mouth every 4 (four) hours as needed for moderate pain (pain).     Marland Kitchen levETIRAcetam (KEPPRA) 250 MG tablet Take 250 mg by mouth 2 (two) times daily.    Marland Kitchen levothyroxine (SYNTHROID, LEVOTHROID) 75 MCG tablet Take 75 mcg by mouth daily before breakfast.     . lubiprostone  (AMITIZA) 24 MCG capsule Take 24 mcg by mouth 2 (two) times daily with a meal.    . metoprolol succinate (TOPROL-XL) 50 MG 24 hr tablet TAKE (1) TABLET BY MOUTH EACH MORNING WITH OR IMMEDIATELY FOLLOWING AMEAL. 30 tablet 6  . Multiple Vitamin (MULTIVITAMIN WITH MINERALS) TABS tablet Take 1 tablet by mouth 2 (two) times daily. 25 tablet 4  . NITROSTAT 0.4 MG SL tablet DISSOLVE 1 TABLET UNDER TONGUE EVERY 5 MINUTES UP TO 15 MIN FOR CHESTPAIN. IF NO RELIEF CALL 911. 25 tablet 3  . omega-3 acid ethyl esters (LOVAZA) 1 G capsule Take 2 capsules (2 g total) by mouth 2 (two) times daily. 60 capsule 11  . omeprazole (PRILOSEC) 20 MG capsule TAKE ONE BY MOUTH CAPSULE DAILY. 30 capsule 3  . potassium chloride SA (K-DUR,KLOR-CON) 20 MEQ tablet TAKE (1) TABLET BY MOUTH EACH MORNING. 30 tablet 0  . ranolazine (RANEXA) 500 MG 12 hr tablet Take 1 tablet (500 mg total) by mouth 2 (two) times daily. 60 tablet 11  . [DISCONTINUED] carbamazepine (TEGRETOL XR) 200 MG 12 hr tablet Take 200 mg by mouth 2 (two) times daily.      . [DISCONTINUED] lamoTRIgine (LAMICTAL) 100 MG tablet Take 100 mg by mouth 2 (two) times daily.       No current facility-administered medications for this visit.     Past Surgical History  Procedure Laterality Date  . Coronary artery bypass graft  2005    LIMA to AL, SVG to LAD, SVG to Cx, SVG-PDA  . Carotid endarterectomy  2005  . Knee arthroscopy w/ acl reconstruction Left 2001  . Carpal tunnel with cubital tunnel Right 2001  . Coronary angioplasty with stent placement  2011    Promus Premier DES, 3.5 mm x 12 mm, for 99% proximal stent ISR in the SVG-RPDA   . Cardiac catheterization  Jan 2012    patent stents in RCA graft; CAD stable  . Coronary angioplasty with stent placement  05/24/2014  . Appendectomy  ~ 1970  . Laparoscopic cholecystectomy  2010  . Vaginal hysterectomy  ~ 1993  . Tubal ligation  ~ 1989  . Left heart catheterization with coronary/graft angiogram N/A 05/24/2014      Procedure: LEFT HEART CATHETERIZATION WITH Beatrix Fetters;  Surgeon: Leonie Man, MD; EF 50-55%, LAD & D1 100%, LIMA-D1 OK, CFX OK, OM1 & OM2 100%, lat OM Tenishia Ekman 100%, RCA 100%; pSVG-RPDA 99% rx w/ scoring balloon  PTCA & 3.5 x 12 mm Promus DES; SVG-LAD, SVG-OM known occluded      . Cardiac catheterization N/A 06/03/2015    Procedure: Left Heart Cath and Cors/Grafts Angiography;  Surgeon: Belva Crome, MD;  Location: [facility];  Service: Cardiovascular;  Laterality: N/A;     Allergies  Allergen Reactions  . Adhesive [Tape] Itching and Other (See Comments)    Blisters (pls use paper tape)  . Crestor [Rosuvastatin] Other (See Comments)    Muscle pain  . Lipitor [Atorvastatin] Other (See Comments)    Cramps      Family History  Problem Relation Age of Onset  . Heart attack Mother   . Heart disease Mother   . Breast cancer Mother   . Heart attack Father   . Heart disease Father   . Colon cancer Neg Hx      Social History Ms. Yip reports that she has been smoking Cigarettes.  She started smoking about 22 years ago. She has a 10.5 pack-year smoking history. She has never used smokeless tobacco. Ms. Riccardi reports that she does not drink alcohol.   Review of Systems CONSTITUTIONAL: No weight loss, fever, chills, weakness or fatigue.  HEENT: Eyes: No visual loss, blurred vision, double vision or yellow sclerae.No hearing loss, sneezing, congestion, runny nose or sore throat.  SKIN: No rash or itching.  CARDIOVASCULAR:per hpi  RESPIRATORY: No shortness of breath, cough or sputum.  GASTROINTESTINAL: No anorexia, nausea, vomiting or diarrhea. No abdominal pain or blood.  GENITOURINARY: No burning on urination, no polyuria NEUROLOGICAL: No headache, dizziness, syncope, paralysis, ataxia, numbness or tingling in the extremities. No change in bowel or bladder control.  MUSCULOSKELETAL: wrist stiffness and pain LYMPHATICS: No enlarged nodes. No history of  splenectomy.  PSYCHIATRIC: No history of depression or anxiety.  ENDOCRINOLOGIC: No reports of sweating, cold or heat intolerance. No polyuria or polydipsia.  Marland Kitchen   Physical Examination Filed Vitals:   02/07/16 1413  BP: 104/69  Pulse: 65   Filed Vitals:   02/07/16 1413  Height: 5' 3.5" (1.613 m)  Weight: 216 lb (97.977 kg)    Gen: resting comfortably, no acute distress HEENT: no scleral icterus, pupils equal round and reactive, no palptable cervical adenopathy,  CV: RRR, 2/6 systolic murmur RUSB, no jvd Resp: Clear to auscultation bilaterally GI: abdomen is soft, non-tender, non-distended, normal bowel sounds, no hepatosplenomegaly MSK: extremities are warm, no edema.  Skin: warm, no rash Neuro:  no focal deficits Psych: appropriate affect   Diagnostic Studies Cath 2012  Left main coronary was normal.  Left anterior descending artery was occluded just after the septal  perforator. First diagonal Navjot Pilgrim was subtotally occluded. There was  some filling of the distal LAD and diagonals from collateral branches  from the LIMA. Circumflex coronary artery was occluded in the  midvessel. The AV groove Timira Bieda was patent. There was a small first  obtuse marginal Glenora Morocho with 30-40% multiple lesions. There was a small  second obtuse marginal Karver Fadden with 30-40% discrete lesions. There is a  very large obtuse marginal Kailen Hinkle seen filling from collaterals from the  left internal mammary artery.  Right coronary artery was 100% occluded proximally with both left-to-  right and right-to-right collaterals. The distal PDA also filled from  the vein graft to the right coronary artery.  The left internal mammary artery was widely patent. It also filled the  distal right large obtuse marginal Narayan Scull and filled retrograde to a  smaller diagonal Sebasthian Stailey.  There was known occlusion of the 2 left-sided vein grafts. The  saphenous vein graft to the right coronary artery was widely  patent.  Bare-metal stent in the mid-to-distal vessel widely patent. There was  poor runoff to the vessel primarily being retrograde into the posterior  lateral Gianlucas Evenson, however, there was no new focal stenosis.  RAO ventriculography. RAO ventriculography showed anterior apical and  mid inferior wall hypokinesis. EF was in the 40% range.  Right heart catheterization showed mean pulmonary capillary wedge  pressure of 24, PA pressure was 55/26, RV pressure was 52/5, RA pressure  mean was 16, aortic pressure was 155/76, LV pressure was equal to 167/16  with a post A-wave EDP of 26.  Cardiac output was 6.3 liters per minute with a cardiac index of 3  liters per minute per meter squared by Fick.   IMPRESSION: The patient's coronary artery disease is stable. The  stents in the RCA graft were patent. There are no new lesions that I  can see. She does have collateralized distal right and OM Rey Dansby.  Continued medical therapy is warranted. If she continues to have chest  pain, Ranexa may be in order since it is somewhat late in the day and  she had both venous and arterial sheath. She will be kept overnight and  discharged in the morning. She tolerated the procedure well.   03/2014 Echo  Study Conclusions  - Left ventricle: The cavity size was normal. Wall thickness was increased in a pattern of mild LVH. Systolic function was normal. The estimated ejection fraction was in the range of 50% to 55%. There is akinesis of the basalinferolateral myocardium. Features are consistent with a pseudonormal left ventricular filling pattern, with concomitant abnormal relaxation and increased filling pressure (grade 2 diastolic dysfunction). - Aortic valve: Mildly calcified annulus. Trileaflet. There was no significant regurgitation. - Mitral valve: There was trivial regurgitation. - Left atrium: The atrium was mildly to moderately dilated. - Right ventricle: Systolic function was mildly  reduced. - Right atrium: Central venous pressure (est): 3 mm Hg. - Atrial septum: No defect or patent foramen ovale was identified. - Tricuspid valve: There was trivial regurgitation. - Pulmonary arteries: Systolic pressure could not be accurately estimated. - Pericardium, extracardiac: There was no pericardial effusion.  Impressions:  - Mild LVH with LVEF 50-55%, basal inferolateral hypokinesis, grade 2 diastolic dysfunction. Mild to moderate left atrial enlargement. Mildly reduced RV contraction. Unable to assess PASP.  03/2014 Carotid US  IMPRESSION:  Bilateral 50-69% stenosis. The left internal carotid peak systolic  velocity has increased slightly in the interval from the prior exam.  05/2014 Cath FINDINGS:  Hemodynamics:  1. Central Aortic Pressure / Mean: 110/62/81 mmHg 2. Left Ventricular Pressure / LVEDP: 110/14/21 mmHg  Left Ventriculography:  EF: 50 at 55 %  Wall Motion: Severe hypokinesis of the Basal to MID inferior-inferolateraL wall with mild hypokinesis of the basal anterior wall  Coronary Anatomy:  Dominance: Right  Left Main: Normal caliber vessel that itself is free of significant disease. Bifurcates into the LAD and Circumflex LAD: Moderate caliber vessel that is 100% occluded after a septal perforator and small diagonal Timia Casselman. There is a first diagonal Massiah Longanecker that is also half percent occluded after initial subtotal occlusion.  Left Circumflex: Moderate caliber vessel it gives rise to a proximal small caliber OM Robin Pafford that is somewhat tortuous and free of significant disease. The vessel then bifurcates into the introducer complex which is small in caliber and perfuses the basal inferolateral  wall. There is a major lateral OM Tyquon Near the visualized the proximal segment is occluded. There is late retrograde filling of the remainder the nature OM which appears to have 2 branches distally. This is faint collaterals from the proximal OM.   RCA: 100%  proximal occluded. SVG-RPDA: Large-caliber graft with mildly irregular is proximally, there are overlapping stents in the distal mid graft with 99% napkin ring proximal in-stent restenosis of the most proximal stent that extends just minimally to the unstented segment. The remainder of the graft is relatively free of disease and attached to the mid RPDA.  RPDA: Large-caliber tortuous vessel that reaches almost to the apex.  RPL Sysytem:The RPAV fills via retrograde flow in the RPDA. There are several small posterolateral branches that are relatively free of disease. The PAV is relatively free of disease.  After reviewing the initial angiography, the culprit lesion was thought to be the 99% proximal stent ISR in the SVG-RPDA. Preparation were made to proceed with PCI on this lesion. As the lesion is focal & appears to be fibrous in nature and too stenosed to safely pass a filter wire, the decision was made to pre-dilate & stent without distal protection. No evidence of a filling defect was noted besides the "napkin-ring" lesion.  Percutaneous Coronary Intervention: Sheath exchanged for 6 Fr Guide: 6 Fr AL1Guidewire: BMW (after failed attempt with Pro-Water Predilation Balloon: Angioscult Cutting Balloon 3.5 mm x 10 mm;   12 Atm x 45 Sec,-- excellent predilation of the fibrous lesion at high pressure. Brisk TIMI-3 flow down stream but no sign of no reflow. The decision was made to proceed with stenting. Stent: Promus Premier DES 3.5 mm x 12 mm; overlaps roughly 4 mm into the original stent.  Deployed at: 20 Atm x 30 Sec Post-dilation Balloon: [place] Emerge 4.0 mm x 8 mm;   14 Atm x 30 Sec x 2 -   Final Diameter: 4.1 mm  Post deployment angiography in multiple views, with and without guidewire in place revealed excellent stent deployment and lesion coverage. There was no evidence of dissection or perforation. Brisk TIMI-3 flow is noted in the downstream  vessel  MEDICATIONS:  Anesthesia: Local Lidocaine 2 ml  Sedation: 4 mg IV Versed, 100 mcg IV fentanyl ;   Omnipaque Contrast: 180 ml  Anticoagulation: IV Heparin 13500 Units total   Anti-Platelet Agent: Effient and aspirin as standing home medication  PATIENT DISPOSITION:   The patient was transferred to the PACU holding area in a hemodynamicaly stable, chest pain free condition.  The patient tolerated the procedure well, and there were no complications. EBL: < 5 ml  The patient was stable before, during, and after the procedure.  POST-OPERATIVE DIAGNOSIS:   Severe native CAD with 100% negative RCA, LAD & D1 along with OM 1 and OM 2 as previously described  Severe proximal edge in-stent restenosis of SVG-RPDA   Successful PCI of the proximal edge in-stent restenosis of SVG RCA with scoring balloon angioplasty followed by DES stent placement: Promus Premier 3.5 mm x 12 mm postdilated to 4.1 mm  Widely patent LIMA-diagonal with distal collaterals to what appears to be the apical LAD.  Known occlusion of the SVG-LAD and SVG-OM  Relatively preserved LVEF as noted on the cardiogram recently. There is known hypokinesis to akinesis of the nasal to mid inferior inferolateral wall as well as mild hypokinesis of the basal anterior wall assistant with known CAD.  PLAN OF CARE:  Overnight observation post-PCI. Continue home doses of  aspirin plus Effient.  Standard post radial cath care with care being removal   Discharge in the morning if stable.  Followup with Dr. Harl Bowie - continue to optimize medical management of existing severe CAD.   05/2015 Cath  Dominance: Co-dominant   Left Anterior Descending   . Mid LAD lesion, 100% stenosed.   . First Diagonal Masha Orbach   The vessel is small in size.   . 1st Diag lesion, 99% stenosed.     Ramus Intermedius  The vessel is small .      Left Circumflex  The vessel is small .   Marland Kitchen Mid Cx to Dist Cx lesion, 100% stenosed.   . Second Obtuse Marginal Joven Mom   The vessel is small in size.   . Third Obtuse Marginal Krisann Mckenna   3rd Mrg filled by collaterals from 1st Mrg.     Right Coronary Artery   . Prox RCA to Mid RCA lesion, 100% stenosed.   . Dist RCA lesion, 100% stenosed.     Graft Angiography    Free Graft to Dist LAD  SVG   . Prox Graft to Mid Graft lesion, 100% stenosed.     Free Graft to Dist RCA  SVG   . Mid Graft lesion, 30% stenosed. The lesion was previously treated with a stent (unknown type) .     Free Graft to 2nd Mrg  SVG   . Origin to Prox Graft lesion, 100% stenosed.     Free LIMA Graft to 2nd Diag  LIMA        Cath RECOMMENDATIONS:   Continued aggressive risk factor modification  No anatomy amenable to percutaneous intervention.                               Assessment and Plan   1. CAD  - no current symptoms, we will continue current meds - notes very easy bruising, we will stop her ASA. Continue effient for secondary prevention given her long significant CAD history.  2. Carotid stenosis  - moderate by last [place] 08/2015, repeat later this year.   3. HTN  - at goal. Reports some orthostatic symptoms, we will decrease lbenazepril to 61m daily.   4. Hyperlipidemia - has not tolerated statins. We will repeat lipid panel, consider zetia peniding LDL.   5. Preop evaluation - from cardiac standpoint, she is stable to proceed with hand surgery   F/u 6 months. Repeat annual labs     JArnoldo Lenis M.D.

## 2016-02-07 NOTE — Patient Instructions (Addendum)
Your physician wants you to follow-up in: 6 MONTHS WITH DR. BRANCH You will receive a reminder letter in the mail two months in advance. If you don't receive a letter, please call our office to schedule the follow-up appointment.  Your physician has recommended you make the following change in your medication:   STOP ASPRIN   DECREASE BENAZEPRIL 10 MG DAILY  Your physician recommends that you return for lab work PLEASE FAST FOR LAB WORK - CBC/TSH/LIPIDS/HGBA1C/MG/BMP  Thank you for choosing [facility]!!

## 2016-02-12 ENCOUNTER — Ambulatory Visit (INDEPENDENT_AMBULATORY_CARE_PROVIDER_SITE_OTHER): Payer: Medicaid Other | Admitting: Orthopedic Surgery

## 2016-02-12 VITALS — BP 111/67 | HR 66 | Ht 63.5 in | Wt 212.8 lb

## 2016-02-12 DIAGNOSIS — M654 Radial styloid tenosynovitis [de Quervain]: Secondary | ICD-10-CM | POA: Diagnosis not present

## 2016-02-12 DIAGNOSIS — M65341 Trigger finger, right ring finger: Secondary | ICD-10-CM

## 2016-02-12 DIAGNOSIS — M1811 Unilateral primary osteoarthritis of first carpometacarpal joint, right hand: Secondary | ICD-10-CM | POA: Diagnosis not present

## 2016-02-12 NOTE — Progress Notes (Signed)
Patient ID: [name], female   DOB: [birth year], 60 y.o.   MRN: [id number]  Chief Complaint  Patient presents with  . Follow-up    Bilateral wrist pain    HPI-The patient had been scheduled for a de Quervain's release and a trigger finger release but her brother had a stroke and she caught a virus and decided to start a more vigorous exercise program for her wrist and most of her pain has resolved. She has mild pain now in the right first extensor compartment. She does still uses her braces, uses ice and one half to one full tablet of hydrocodone.  Review of Systems  Musculoskeletal:       Left knee pain    BP 111/67 mmHg  Pulse 66  Ht 5' 3.5" (1.613 m)  Wt 212 lb 12.8 oz (96.525 kg)  BMI 37.10 kg/m2  Physical Exam Physical Exam  Constitutional: The patient is oriented to person, place, and time. The patient appears well-developed and well-nourished. No distress.  Cardiovascular: Intact distal pulses.   Neurological: The patient is alert and oriented to person, place, and time. The patient exhibits normal muscle tone. Coordination normal.  Skin: Skin is warm and dry. No rash noted. The patient is not diaphoretic. No erythema. No pallor.  Psychiatric: The patient has a normal mood and affect. Her behavior is normal. Judgment and thought content normal.   Gait is normal   Ortho Exam  Wrist examination shows normal range of motion in both wrists negative Finkelstein test.  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

## 2016-02-14 ENCOUNTER — Other Ambulatory Visit: Payer: Self-pay | Admitting: Cardiology

## 2016-02-14 LAB — CBC
HCT: 38.3 % (ref 35.0–45.0)
Hemoglobin: 12.8 g/dL (ref 11.7–15.5)
MCH: 30.5 pg (ref 27.0–33.0)
MCHC: 33.4 g/dL (ref 32.0–36.0)
MCV: 91.4 fL (ref 80.0–100.0)
MPV: 10.1 fL (ref 7.5–12.5)
PLATELETS: 367 10*3/uL (ref 140–400)
RBC: 4.19 MIL/uL (ref 3.80–5.10)
RDW: 13.3 % (ref 11.0–15.0)
WBC: 7.8 10*3/uL (ref 3.8–10.8)

## 2016-02-14 LAB — BASIC METABOLIC PANEL
BUN: 11 mg/dL (ref 7–25)
CHLORIDE: 101 mmol/L (ref 98–110)
CO2: 26 mmol/L (ref 20–31)
Calcium: 10.3 mg/dL (ref 8.6–10.4)
Creat: 0.98 mg/dL (ref 0.50–1.05)
Glucose, Bld: 117 mg/dL — ABNORMAL HIGH (ref 65–99)
POTASSIUM: 4.7 mmol/L (ref 3.5–5.3)
SODIUM: 137 mmol/L (ref 135–146)

## 2016-02-14 LAB — LIPID PANEL
Cholesterol: 248 mg/dL — ABNORMAL HIGH (ref 125–200)
HDL: 43 mg/dL — AB (ref >=46)
LDL CALC: 163 mg/dL — AB (ref <130)
TRIGLYCERIDES: 208 mg/dL — AB (ref <150)
Total CHOL/HDL Ratio: 5.8 Ratio — ABNORMAL HIGH (ref <=5.0)
VLDL: 42 mg/dL — AB (ref <30)

## 2016-02-14 LAB — TSH: TSH: 10.59 mIU/L — ABNORMAL HIGH

## 2016-02-14 LAB — MAGNESIUM: Magnesium: 2.1 mg/dL (ref 1.5–2.5)

## 2016-02-14 LAB — HEMOGLOBIN A1C
Hgb A1c MFr Bld: 6 % — ABNORMAL HIGH (ref <5.7)
Mean Plasma Glucose: 126 mg/dL

## 2016-02-17 ENCOUNTER — Telehealth: Payer: Self-pay

## 2016-02-17 MED ORDER — EZETIMIBE 10 MG PO TABS: 10.0000 mg | ORAL_TABLET | Freq: Every day | ORAL | Status: DC

## 2016-02-17 NOTE — Telephone Encounter (Signed)
-----   Message from Antoine PocheJonathan F Branch, MD sent at 02/17/2016  3:48 PM EDT ----- Labs tests show cholesterol is too high, please start zetia 10mg  daily. Her thyroid test is abnormal and her medication may need to be adjusted, please forward to pcp and make patient aware. Labs show evidence of PREdiabets, but better than a few years ago. COntinue to work on weight loss and diet changes  Dominga FerryJ Branch MD

## 2016-02-17 NOTE — Telephone Encounter (Signed)
Results given to patient,copy of labs to pcp,e-scribed zetia to pharmacy

## 2016-02-21 ENCOUNTER — Telehealth: Payer: Self-pay

## 2016-02-21 NOTE — Telephone Encounter (Signed)
PA approved for zetia 10 ng daily for 1 yr approval number [id number],[place] [facility] aware

## 2016-03-09 ENCOUNTER — Other Ambulatory Visit: Payer: Self-pay | Admitting: Cardiology

## 2016-03-24 ENCOUNTER — Other Ambulatory Visit: Payer: Self-pay | Admitting: Adult Health

## 2016-04-08 ENCOUNTER — Telehealth: Payer: Self-pay | Admitting: *Deleted

## 2016-04-08 NOTE — Telephone Encounter (Signed)
Can try stopping the medication for 1 week and update us on symptoms   Dominga FerryJ Jazion Atteberry MD

## 2016-04-08 NOTE — Telephone Encounter (Signed)
Pt agreeable to hold Ranexa for 1 week and call us back with update

## 2016-04-08 NOTE — Telephone Encounter (Signed)
Pt says Ranexa 500 mg bid is giving her severe headaches and no energy - this has been going on for the last month - pt says weight is down to 185 lbs and that medication is to strong. Will forward to Dr. Wyline MoodBranch for further suggestions.

## 2016-04-13 ENCOUNTER — Observation Stay (HOSPITAL_COMMUNITY)
Admission: EM | Admit: 2016-04-13 | Discharge: 2016-04-15 | Disposition: A | Discharge status: Home / Self Care | Payer: Medicaid Other | Attending: Pulmonary Disease | Admitting: Pulmonary Disease

## 2016-04-13 ENCOUNTER — Encounter (HOSPITAL_COMMUNITY): Payer: Self-pay | Admitting: Emergency Medicine

## 2016-04-13 ENCOUNTER — Emergency Department (HOSPITAL_COMMUNITY): Payer: Medicaid Other

## 2016-04-13 DIAGNOSIS — J449 Chronic obstructive pulmonary disease, unspecified: Secondary | ICD-10-CM | POA: Insufficient documentation

## 2016-04-13 DIAGNOSIS — I10 Essential (primary) hypertension: Secondary | ICD-10-CM | POA: Insufficient documentation

## 2016-04-13 DIAGNOSIS — E785 Hyperlipidemia, unspecified: Secondary | ICD-10-CM | POA: Insufficient documentation

## 2016-04-13 DIAGNOSIS — I255 Ischemic cardiomyopathy: Secondary | ICD-10-CM | POA: Diagnosis present

## 2016-04-13 DIAGNOSIS — Z951 Presence of aortocoronary bypass graft: Secondary | ICD-10-CM

## 2016-04-13 DIAGNOSIS — R079 Chest pain, unspecified: Secondary | ICD-10-CM | POA: Diagnosis not present

## 2016-04-13 DIAGNOSIS — I209 Angina pectoris, unspecified: Principal | ICD-10-CM | POA: Insufficient documentation

## 2016-04-13 DIAGNOSIS — J45909 Unspecified asthma, uncomplicated: Secondary | ICD-10-CM | POA: Insufficient documentation

## 2016-04-13 DIAGNOSIS — Z9861 Coronary angioplasty status: Secondary | ICD-10-CM | POA: Diagnosis not present

## 2016-04-13 DIAGNOSIS — M1990 Unspecified osteoarthritis, unspecified site: Secondary | ICD-10-CM | POA: Insufficient documentation

## 2016-04-13 DIAGNOSIS — R0789 Other chest pain: Secondary | ICD-10-CM

## 2016-04-13 DIAGNOSIS — Z7982 Long term (current) use of aspirin: Secondary | ICD-10-CM | POA: Diagnosis not present

## 2016-04-13 DIAGNOSIS — R569 Unspecified convulsions: Secondary | ICD-10-CM

## 2016-04-13 DIAGNOSIS — F329 Major depressive disorder, single episode, unspecified: Secondary | ICD-10-CM | POA: Diagnosis not present

## 2016-04-13 DIAGNOSIS — E039 Hypothyroidism, unspecified: Secondary | ICD-10-CM | POA: Insufficient documentation

## 2016-04-13 DIAGNOSIS — I2510 Atherosclerotic heart disease of native coronary artery without angina pectoris: Secondary | ICD-10-CM | POA: Diagnosis not present

## 2016-04-13 DIAGNOSIS — Z87891 Personal history of nicotine dependence: Secondary | ICD-10-CM | POA: Diagnosis not present

## 2016-04-13 DIAGNOSIS — R072 Precordial pain: Secondary | ICD-10-CM | POA: Diagnosis present

## 2016-04-13 DIAGNOSIS — Z79899 Other long term (current) drug therapy: Secondary | ICD-10-CM | POA: Diagnosis not present

## 2016-04-13 DIAGNOSIS — Z5181 Encounter for therapeutic drug level monitoring: Secondary | ICD-10-CM | POA: Insufficient documentation

## 2016-04-13 LAB — CBC
HEMATOCRIT: 37.6 % (ref 36.0–46.0)
HEMOGLOBIN: 12.6 g/dL (ref 12.0–15.0)
MCH: 30.8 pg (ref 26.0–34.0)
MCHC: 33.5 g/dL (ref 30.0–36.0)
MCV: 91.9 fL (ref 78.0–100.0)
Platelets: 303 10*3/uL (ref 150–400)
RBC: 4.09 MIL/uL (ref 3.87–5.11)
RDW: 12.9 % (ref 11.5–15.5)
WBC: 7.4 10*3/uL (ref 4.0–10.5)

## 2016-04-13 LAB — PROTIME-INR
INR: 1.06 (ref 0.00–1.49)
Prothrombin Time: 14 seconds (ref 11.6–15.2)

## 2016-04-13 LAB — BASIC METABOLIC PANEL
ANION GAP: 8 (ref 5–15)
BUN: 10 mg/dL (ref 6–20)
CHLORIDE: 103 mmol/L (ref 101–111)
CO2: 25 mmol/L (ref 22–32)
CREATININE: 0.77 mg/dL (ref 0.44–1.00)
Calcium: 9.1 mg/dL (ref 8.9–10.3)
GFR calc non Af Amer: >60 mL/min (ref >60)
Glucose, Bld: 114 mg/dL — ABNORMAL HIGH (ref 65–99)
POTASSIUM: 4.2 mmol/L (ref 3.5–5.1)
SODIUM: 136 mmol/L (ref 135–145)

## 2016-04-13 LAB — APTT: APTT: 30 s (ref 24–37)

## 2016-04-13 LAB — TROPONIN I
TROPONIN I: 0.03 ng/mL — AB (ref <0.03)
Troponin I: 0.03 ng/mL (ref <0.03)

## 2016-04-13 MED ORDER — GI COCKTAIL ~~LOC~~: 30.0000 mL | Freq: Four times a day (QID) | ORAL | Status: DC | PRN

## 2016-04-13 MED ORDER — PANTOPRAZOLE SODIUM 40 MG PO TBEC
40.0000 mg | DELAYED_RELEASE_TABLET | Freq: Every day | ORAL | Status: DC
  Administered 2016-04-13 – 2016-04-15 (×3): 40 mg via ORAL
  Filled 2016-04-13 (×3): qty 1

## 2016-04-13 MED ORDER — ALBUTEROL SULFATE (2.5 MG/3ML) 0.083% IN NEBU: 2.5000 mg | INHALATION_SOLUTION | Freq: Four times a day (QID) | RESPIRATORY_TRACT | Status: DC | PRN

## 2016-04-13 MED ORDER — ASPIRIN 81 MG PO CHEW
324.0000 mg | CHEWABLE_TABLET | Freq: Once | ORAL | Status: AC
  Administered 2016-04-13: 324 mg via ORAL
  Filled 2016-04-13: qty 4

## 2016-04-13 MED ORDER — LEVETIRACETAM 250 MG PO TABS
250.0000 mg | ORAL_TABLET | Freq: Two times a day (BID) | ORAL | Status: DC
  Administered 2016-04-13 – 2016-04-15 (×4): 250 mg via ORAL
  Filled 2016-04-13 (×4): qty 1

## 2016-04-13 MED ORDER — BENAZEPRIL HCL 10 MG PO TABS
10.0000 mg | ORAL_TABLET | Freq: Every day | ORAL | Status: DC
  Administered 2016-04-13 – 2016-04-15 (×3): 10 mg via ORAL
  Filled 2016-04-13 (×3): qty 1

## 2016-04-13 MED ORDER — FUROSEMIDE 80 MG PO TABS: 80.0000 mg | ORAL_TABLET | Freq: Every day | ORAL | Status: DC | PRN

## 2016-04-13 MED ORDER — ACETAMINOPHEN 325 MG PO TABS: 650.0000 mg | ORAL_TABLET | ORAL | Status: DC | PRN

## 2016-04-13 MED ORDER — LEVOTHYROXINE SODIUM 75 MCG PO TABS
75.0000 ug | ORAL_TABLET | Freq: Every day | ORAL | Status: DC
Start: 2016-04-14 — End: 2016-04-15
  Administered 2016-04-14 – 2016-04-15 (×2): 75 ug via ORAL
  Filled 2016-04-13 (×2): qty 1

## 2016-04-13 MED ORDER — METOPROLOL SUCCINATE ER 50 MG PO TB24
50.0000 mg | ORAL_TABLET | Freq: Every day | ORAL | Status: DC
  Administered 2016-04-13: 50 mg via ORAL
  Filled 2016-04-13 (×2): qty 1

## 2016-04-13 MED ORDER — ACETAMINOPHEN 325 MG PO TABS
650.0000 mg | ORAL_TABLET | Freq: Once | ORAL | Status: AC
  Administered 2016-04-13: 650 mg via ORAL
  Filled 2016-04-13: qty 2

## 2016-04-13 MED ORDER — ASPIRIN 325 MG PO TABS
325.0000 mg | ORAL_TABLET | Freq: Every day | ORAL | Status: DC
  Administered 2016-04-13 – 2016-04-15 (×3): 325 mg via ORAL
  Filled 2016-04-13 (×3): qty 1

## 2016-04-13 MED ORDER — AMLODIPINE BESYLATE 5 MG PO TABS
10.0000 mg | ORAL_TABLET | Freq: Every day | ORAL | Status: DC
  Administered 2016-04-13 – 2016-04-15 (×3): 10 mg via ORAL
  Filled 2016-04-13 (×3): qty 2

## 2016-04-13 MED ORDER — NITROGLYCERIN 0.4 MG SL SUBL: 0.4000 mg | SUBLINGUAL_TABLET | SUBLINGUAL | Status: DC | PRN

## 2016-04-13 MED ORDER — ENOXAPARIN SODIUM 40 MG/0.4ML ~~LOC~~ SOLN
40.0000 mg | SUBCUTANEOUS | Status: DC
  Administered 2016-04-13 – 2016-04-14 (×2): 40 mg via SUBCUTANEOUS
  Filled 2016-04-13 (×2): qty 0.4

## 2016-04-13 MED ORDER — PRASUGREL HCL 10 MG PO TABS
10.0000 mg | ORAL_TABLET | Freq: Every day | ORAL | Status: DC
  Administered 2016-04-13 – 2016-04-15 (×3): 10 mg via ORAL
  Filled 2016-04-13 (×6): qty 1

## 2016-04-13 MED ORDER — HYDROCODONE-ACETAMINOPHEN 10-325 MG PO TABS
1.0000 | ORAL_TABLET | ORAL | Status: DC | PRN
  Administered 2016-04-13 – 2016-04-15 (×2): 1 via ORAL
  Filled 2016-04-13 (×3): qty 1

## 2016-04-13 MED ORDER — ONDANSETRON HCL 4 MG/2ML IJ SOLN
4.0000 mg | Freq: Four times a day (QID) | INTRAMUSCULAR | Status: DC | PRN
  Administered 2016-04-14: 4 mg via INTRAVENOUS
  Filled 2016-04-13: qty 2

## 2016-04-13 MED ORDER — NITROGLYCERIN 2 % TD OINT
1.0000 [in_us] | TOPICAL_OINTMENT | Freq: Four times a day (QID) | TRANSDERMAL | Status: DC
  Administered 2016-04-13 – 2016-04-14 (×3): 1 [in_us] via TOPICAL
  Filled 2016-04-13 (×4): qty 1

## 2016-04-13 MED ORDER — EZETIMIBE 10 MG PO TABS
10.0000 mg | ORAL_TABLET | Freq: Every day | ORAL | Status: DC
  Administered 2016-04-13 – 2016-04-15 (×3): 10 mg via ORAL
  Filled 2016-04-13 (×3): qty 1

## 2016-04-13 MED ORDER — ALPRAZOLAM 1 MG PO TABS
1.0000 mg | ORAL_TABLET | Freq: Four times a day (QID) | ORAL | Status: DC | PRN
  Administered 2016-04-13 – 2016-04-14 (×2): 1 mg via ORAL
  Filled 2016-04-13 (×2): qty 1

## 2016-04-13 NOTE — ED Provider Notes (Signed)
CSN: [id number]     Arrival date & time 04/13/16  1007 History  By signing my name below, I, Jasmyn B. Alexander, attest that this documentation has been prepared under the direction and in the presence of Derwood Kaplan, MD.  Electronically Signed: Gillis Ends. Lyn Hollingshead, ED Scribe. 04/13/2016. 11:35 AM.   Chief Complaint  Patient presents with  . Chest Pain   The history is provided by the patient. No language interpreter was used.    HPI Comments: [name] is a 60 y.o. female brought in by ambulance, with PMHx of MI, HTN, HLD, CAD, and COPD who presents to the Emergency Department complaining of gradual onset, intermittent, moderate, radiating left shoulder to substernal chest pain x 3 hrs PTA. Pt states she woke up feeling short of breath. She notes that she has been having intermittent anginal pain x 1 week, which is unusual for her. Pt has associated productive cough with clear phlegm. She took numerous puffs of her inhaler PTA with no relief of SOB. Pt states that chest pain is worsened while walking. Pt has Nitroglycerin, but denies taking any PTA. Pt is compliant with daily medications but states she has not taken any today. PCP told her to stop taking Renexa, because she said it was causing severe headaches. She reports having three heart attacks in the past resulting in a quadruple bypass and placement of three stents.   Past Medical History  Diagnosis Date  . Coronary artery disease     a. s/p prior CABG 2005. b. BMS 02/2009 to VG-PDA. c. NSTEMI 08/2010 s/p PTCA/DES to SVG-PDA in previously stented area. d. DES to SVG to RCA on 05/24/14  c. LHC 06/04/2015 w/ no sig change in her anatomy and Rx managment  . Hypertension   . Hyperlipidemia   . GERD (gastroesophageal reflux disease)   . Diverticulitis     4 documented episodes by CT; most recent April 2012  . Depression   . Anxiety   . Hypothyroidism   . COPD (chronic obstructive pulmonary disease) (HCC)   . Asthma   . Sleep  apnea     a. Mild-to-moderate obstructive sleep apnea diagnosed in April 2012 by sleep study.  . Anemia   . History of blood transfusion   . Tension headache   . Seizures (HCC)   . Arthritis   . Chronic back pain   . DDD (degenerative disc disease)   . Kidney stones   . Acute renal failure (HCC)   . Ischemic cardiomyopathy     a. EF 40% in 2012 by cath. b. improved to 50-55% by cath 05/2014  c. EF 35-40% by [facility] 05/2015  . Carotid artery disease (HCC)     a. Duplex 50-69% BICA 03/2014.  Marland Kitchen Sinus bradycardia   . HTN (hypertension)    Past Surgical History  Procedure Laterality Date  . Coronary artery bypass graft  2005    LIMA to AL, SVG to LAD, SVG to Cx, SVG-PDA  . Carotid endarterectomy  2005  . Knee arthroscopy w/ acl reconstruction Left 2001  . Carpal tunnel with cubital tunnel Right 2001  . Coronary angioplasty with stent placement  2011    Promus Premier DES, 3.5 mm x 12 mm, for 99% proximal stent ISR in the SVG-RPDA   . Cardiac catheterization  Jan 2012    patent stents in RCA graft; CAD stable  . Coronary angioplasty with stent placement  05/24/2014  . Appendectomy  ~ 1970  . Laparoscopic  cholecystectomy  2010  . Vaginal hysterectomy  ~ 1993  . Tubal ligation  ~ 1989  . Left heart catheterization with coronary/graft angiogram N/A 05/24/2014    Procedure: LEFT HEART CATHETERIZATION WITH Isabel Caprice;  Surgeon: Marykay Lex, MD; EF 50-55%, LAD & D1 100%, LIMA-D1 OK, CFX OK, OM1 & OM2 100%, lat OM branch 100%, RCA 100%; pSVG-RPDA 99% rx w/ scoring balloon PTCA & 3.5 x 12 mm Promus DES; SVG-LAD, SVG-OM known occluded      . Cardiac catheterization N/A 06/03/2015    Procedure: Left Heart Cath and Cors/Grafts Angiography;  Surgeon: Lyn Records, MD;  Location: [facility] INVASIVE CV LAB;  Service: Cardiovascular;  Laterality: N/A;   Family History  Problem Relation Age of Onset  . Heart attack Mother   . Heart disease Mother   . Breast cancer Mother   . Heart attack  Father   . Heart disease Father   . Colon cancer Neg Hx    Social History  Substance Use Topics  . Smoking status: Former Smoker -- 0.50 packs/day for 21 years    Types: Cigarettes    Start date: 03/19/1993    Quit date: 07/29/2015  . Smokeless tobacco: Never Used     Comment: smokes some time/go weeks without smoking  . Alcohol Use: No   OB History    No data available     Review of Systems 10 Systems reviewed and all are negative for acute change except as noted in the HPI.  Allergies  Pneumococcal vaccines; Adhesive; Crestor; and Lipitor  Home Medications   Prior to Admission medications   Medication Sig Start Date End Date Taking? Authorizing Provider  albuterol (PROAIR HFA) 108 (90 BASE) MCG/ACT inhaler Inhale 2 puffs into the lungs every 6 (six) hours as needed for wheezing or shortness of breath.   Yes Historical Provider, MD  albuterol (PROVENTIL) (2.5 MG/3ML) 0.083% nebulizer solution Take 2.5 mg by nebulization every 6 (six) hours as needed for wheezing or shortness of breath.   Yes Historical Provider, MD  ALPRAZolam Prudy Feeler) 1 MG tablet Take 1 mg by mouth 4 (four) times daily as needed for anxiety or sleep.    Yes Historical Provider, MD  amLODipine (NORVASC) 10 MG tablet TAKE ONE TABLET BY MOUTH ONCE DAILY. 03/24/16  Yes Antoine Poche, MD  aspirin 325 MG tablet Take 325 mg by mouth daily.   Yes Historical Provider, MD  benazepril (LOTENSIN) 10 MG tablet Take 1 tablet (10 mg total) by mouth daily. 02/07/16  Yes Antoine Poche, MD  EFFIENT 10 MG TABS tablet TAKE (1) TABLET BY MOUTH EACH MORNING. 03/09/16  Yes Antoine Poche, MD  ezetimibe (ZETIA) 10 MG tablet Take 1 tablet (10 mg total) by mouth daily. 02/17/16  Yes Antoine Poche, MD  fluticasone [facility]) 50 MCG/ACT nasal spray Place 2 sprays into both nostrils daily as needed for allergies or rhinitis.   Yes Historical Provider, MD  furosemide (LASIX) 80 MG tablet Take 80 mg by mouth daily as needed for fluid.    Yes Historical Provider, MD  HYDROcodone-acetaminophen (NORCO) 10-325 MG per tablet Take 1 tablet by mouth every 4 (four) hours as needed for moderate pain (pain).    Yes Historical Provider, MD  levETIRAcetam (KEPPRA) 250 MG tablet Take 250 mg by mouth 2 (two) times daily.   Yes Historical Provider, MD  levothyroxine (SYNTHROID, LEVOTHROID) 75 MCG tablet Take 75 mcg by mouth daily before breakfast.    Yes Historical  Provider, MD  metoprolol succinate (TOPROL-XL) 50 MG 24 hr tablet TAKE (1) TABLET BY MOUTH EACH MORNING WITH OR IMMEDIATELY FOLLOWING AMEAL. 10/02/15  Yes Antoine PocheJonathan F Branch, MD  Multiple Vitamin (MULTIVITAMIN WITH MINERALS) TABS tablet Take 1 tablet by mouth 2 (two) times daily. 08/28/13  Yes Antoine PocheJonathan F Branch, MD  NITROSTAT 0.4 MG SL tablet DISSOLVE 1 TABLET UNDER TONGUE EVERY 5 MINUTES UP TO 15 MIN FOR CHESTPAIN. IF NO RELIEF CALL 911. 06/07/15  Yes Antoine PocheJonathan F Branch, MD  omega-3 acid ethyl esters (LOVAZA) 1 G capsule Take 2 capsules (2 g total) by mouth 2 (two) times daily. 06/05/15  Yes Janetta HoraKathryn R Thompson, PA-C  omeprazole (PRILOSEC) 20 MG capsule TAKE ONE BY MOUTH CAPSULE DAILY. 06/21/15  Yes Antoine PocheJonathan F Branch, MD  potassium chloride SA (K-DUR,KLOR-CON) 20 MEQ tablet Take 20 mEq by mouth daily as needed (along with Furosemide).   Yes Historical Provider, MD  butalbital-acetaminophen-caffeine (FIORICET, ESGIC) [phone] MG tablet Take 1 tablet by mouth every 4 (four) hours as needed for headache. 04/15/16   Kari BaarsEdward Hawkins, MD  isosorbide mononitrate (IMDUR) 30 MG 24 hr tablet Take 1 tablet (30 mg total) by mouth daily. 04/15/16   Kari BaarsEdward Hawkins, MD   BP 108/67 mmHg  Pulse 60  Temp(Src) 97.8 F (36.6 C) (Oral)  Resp 17  Ht 5\' 3"  (1.6 m)  Wt 192 lb (87.091 kg)  BMI 34.02 kg/m2  SpO2 98% Physical Exam  Constitutional: She is oriented to person, place, and time. She appears well-developed and well-nourished. No distress.  HENT:  Head: Normocephalic and atraumatic.  Mouth/Throat:  Oropharynx is clear and moist. No oropharyngeal exudate.  Moist mucous membranes   Eyes: Conjunctivae are normal. Pupils are equal, round, and reactive to light.  Neck: No JVD present.  Trachea midline No bruit  Cardiovascular: Normal rate, regular rhythm and normal heart sounds.   Equal radial pulses bilaterally.  Pulmonary/Chest: Effort normal and breath sounds normal. No stridor. No respiratory distress. She has no wheezes.  Abdominal: Soft. Bowel sounds are normal. She exhibits no distension.  Neurological: She is alert and oriented to person, place, and time. She has normal reflexes.  Skin: Skin is warm and dry.  Psychiatric: She has a normal mood and affect. Her behavior is normal.  Nursing note and vitals reviewed.   ED Course  Procedures (including critical care time) DIAGNOSTIC STUDIES: Oxygen Saturation is 95% on adequate, RA by my interpretation.    COORDINATION OF CARE: 11:15 AM-Discussed treatment plan which includes CBC, BMP, Troponin, EKG, CXR, ASA and Nitro ointment with pt at bedside and pt agreed to plan.   Labs Review Labs Reviewed  BASIC METABOLIC PANEL - Abnormal; Notable for the following:    Glucose, Bld 114 (*)    All other components within normal limits  TROPONIN I - Abnormal; Notable for the following:    Troponin I 0.03 (*)    All other components within normal limits  TROPONIN I - Abnormal; Notable for the following:    Troponin I 0.03 (*)    All other components within normal limits  TROPONIN I - Abnormal; Notable for the following:    Troponin I 0.03 (*)    All other components within normal limits  TROPONIN I - Abnormal; Notable for the following:    Troponin I 0.03 (*)    All other components within normal limits  CBC  TROPONIN I  PROTIME-INR  APTT    Imaging Review No results found. I have personally  reviewed and evaluated these images and lab results as part of my medical decision-making.   EKG Interpretation   Date/Time:   Monday April 13 2016 10:12:59 EDT Ventricular Rate:  57 PR Interval:    QRS Duration: 90 QT Interval:  413 QTC Calculation: 403 R Axis:   24 Text Interpretation:  Sinus rhythm RSR' in V1 or V2, probably normal  variant Nonspecific T abnormalities, lateral leads No significant change  since last tracing Confirmed by Rhunette Croft, MD, Janey Genta [phone]) on 04/13/2016  10:56:34 AM      MDM   Final diagnoses:  Angina pectoris (HCC)    I personally performed the services described in this documentation, which was scribed in my presence. The recorded information has been reviewed and is accurate.  Differential diagnosis includes: ACS syndrome CHF exacerbation Myocarditis Pericarditis Pericardial effusion Pneumonia Pleural effusion Pulmonary edema PE Musculoskeletal pain  Pt comes in with cc of chest pain. We have high suspicion of this being ACS. We will admit. EKG and trops are reassuring. Admit for ACS r/o.  Derwood Kaplan, MD 04/16/16 1456
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
## 2016-04-13 NOTE — ED Notes (Signed)
Attempted report x1. 
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
## 2016-04-14 DIAGNOSIS — I25110 Atherosclerotic heart disease of native coronary artery with unstable angina pectoris: Secondary | ICD-10-CM | POA: Diagnosis not present

## 2016-04-14 LAB — TROPONIN I
TROPONIN I: 0.03 ng/mL — AB (ref <0.03)
Troponin I: 0.03 ng/mL (ref <0.03)

## 2016-04-14 MED ORDER — BUTALBITAL-APAP-CAFFEINE 50-325-40 MG PO TABS
1.0000 | ORAL_TABLET | ORAL | Status: DC | PRN
  Administered 2016-04-14 – 2016-04-15 (×3): 1 via ORAL
  Filled 2016-04-14 (×3): qty 1

## 2016-04-14 MED ORDER — ISOSORBIDE MONONITRATE ER 60 MG PO TB24
30.0000 mg | ORAL_TABLET | Freq: Every day | ORAL | Status: DC
  Administered 2016-04-14 – 2016-04-15 (×2): 30 mg via ORAL
  Filled 2016-04-14 (×2): qty 1

## 2016-04-14 MED ORDER — METOPROLOL SUCCINATE ER 25 MG PO TB24
25.0000 mg | ORAL_TABLET | Freq: Every day | ORAL | Status: DC
  Administered 2016-04-14 – 2016-04-15 (×2): 25 mg via ORAL
  Filled 2016-04-14 (×2): qty 1

## 2016-04-14 MED ORDER — ZOLPIDEM TARTRATE 5 MG PO TABS
5.0000 mg | ORAL_TABLET | Freq: Every day | ORAL | Status: AC
  Administered 2016-04-14: 5 mg via ORAL
  Filled 2016-04-14: qty 1

## 2016-04-14 NOTE — Progress Notes (Signed)
Patient HR running brady in the  44-54 range. Orders for nitro paste 1 inch and metoprolol 50 mg po. Notified Dr. Juanetta GoslingHawkins. Orders received to continue nitro paste as ordered and change metoprolol to 25 mg po daily. Cardiology to see patient today as well. Hold nitro paste per Samara DeistKathryn, NP with cardiology. Earnstine RegalAshley Janaiah Vetrano, RN

## 2016-04-14 NOTE — Progress Notes (Signed)
Patient stated she preferred to take her metoprolol and imdur this afternoon instead of at lunch since she had just had her other BP medications. Stated she takes the metoprolol in the afternoons at home. Notified on-coming RN. Earnstine RegalAshley Harly Pipkins, RN

## 2016-04-14 NOTE — Progress Notes (Signed)
Subjective: She was admitted with chest pain. She's no longer having any chest pain but she is having significant headache from nitroglycerin paste. She has no other new complaints. She says she still feels somewhat weak. Cardiology consult is noted and appreciated  Objective: Vital signs in last 24 hours: Temp:  [97.4 F (36.3 C)-98.1 F (36.7 C)] 97.4 F (36.3 C) (07/11 0540) Pulse Rate:  [54-60] 56 (07/11 0540) Resp:  [9-17] 16 (07/11 0540) BP: (112-153)/(52-99) 124/76 mmHg (07/11 0540) SpO2:  [95 %-99 %] 99 % (07/11 0540) Weight:  [83.915 kg (185 lb)-87.091 kg (192 lb)] 87.091 kg (192 lb) (07/10 1535) Weight change:  Last BM Date: 04/12/16  Intake/Output from previous day: 07/10 0701 - 07/11 0700 In: 240 [P.O.:240] Out: -   PHYSICAL EXAM General appearance: alert, cooperative and mild distress Resp: clear to auscultation bilaterally Cardio: regular rate and rhythm, S1, S2 normal, no murmur, click, rub or gallop GI: soft, non-tender; bowel sounds normal; no masses,  no organomegaly Extremities: extremities normal, atraumatic, no cyanosis or edema  Lab Results:  Results for orders placed or performed during the hospital encounter of 04/13/16 (from the past 48 hour(s))  CBC     Status: None   Collection Time: 04/13/16 11:44 AM  Result Value Ref Range   WBC 7.4 4.0 - 10.5 K/uL   RBC 4.09 3.87 - 5.11 MIL/uL   Hemoglobin 12.6 12.0 - 15.0 g/dL   HCT 37.6 36.0 - 46.0 %   MCV 91.9 78.0 - 100.0 fL   MCH 30.8 26.0 - 34.0 pg   MCHC 33.5 30.0 - 36.0 g/dL   RDW 12.9 11.5 - 15.5 %   Platelets 303 150 - 400 K/uL  Basic metabolic panel     Status: Abnormal   Collection Time: 04/13/16 11:44 AM  Result Value Ref Range   Sodium 136 135 - 145 mmol/L   Potassium 4.2 3.5 - 5.1 mmol/L   Chloride 103 101 - 111 mmol/L   CO2 25 22 - 32 mmol/L   Glucose, Bld 114 (H) 65 - 99 mg/dL   BUN 10 6 - 20 mg/dL   Creatinine, Ser 0.77 0.44 - 1.00 mg/dL   Calcium 9.1 8.9 - 10.3 mg/dL   GFR calc  non Af Amer >60 >60 mL/min   GFR calc Af Amer >60 >60 mL/min    Comment: (NOTE) The eGFR has been calculated using the CKD EPI equation. This calculation has not been validated in all clinical situations. eGFR's persistently <60 mL/min signify possible Chronic Kidney Disease.    Anion gap 8 5 - 15  Troponin I  (0, 3, 6)     Status: None   Collection Time: 04/13/16 11:44 AM  Result Value Ref Range   Troponin I <0.03 <0.03 ng/mL  Protime-INR     Status: None   Collection Time: 04/13/16 11:44 AM  Result Value Ref Range   Prothrombin Time 14.0 11.6 - 15.2 seconds   INR 1.06 0.00 - 1.49  APTT     Status: None   Collection Time: 04/13/16 11:44 AM  Result Value Ref Range   aPTT 30 24 - 37 seconds  Troponin I  (0, 3, 6)     Status: Abnormal   Collection Time: 04/13/16  2:06 PM  Result Value Ref Range   Troponin I 0.03 (HH) <0.03 ng/mL    Comment: CRITICAL RESULT CALLED TO, READ BACK BY AND VERIFIED WITH: DICKERSON,M ON 04/13/16 AT 1505 BY LOY,C   Troponin I (q  6hr x 3)     Status: Abnormal   Collection Time: 04/13/16  5:57 PM  Result Value Ref Range   Troponin I 0.03 (HH) <0.03 ng/mL    Comment: DELTA CHECK NOTED CRITICAL VALUE NOTED.  VALUE IS CONSISTENT WITH PREVIOUSLY REPORTED AND CALLED VALUE.   Troponin I (q 6hr x 3)     Status: Abnormal   Collection Time: 04/13/16 11:54 PM  Result Value Ref Range   Troponin I 0.03 (HH) <0.03 ng/mL    Comment: DELTA CHECK NOTED CRITICAL VALUE NOTED.  VALUE IS CONSISTENT WITH PREVIOUSLY REPORTED AND CALLED VALUE.     ABGS No results for input(s): PHART, PO2ART, TCO2, HCO3 in the last 72 hours.  Invalid input(s): PCO2 CULTURES No results found for this or any previous visit (from the past 240 hour(s)). Studies/Results: Dg Chest 2 View  04/13/2016  CLINICAL DATA:  Shortness of breath starting 7 a.m. this morning EXAM: CHEST  2 VIEW COMPARISON:  06/03/2015 FINDINGS: Cardiomediastinal silhouette is stable. Status post CABG. No acute  infiltrate or pleural effusion. No pulmonary edema. Mild degenerative changes thoracic spine. IMPRESSION: No active cardiopulmonary disease. Electronically Signed   By: Lahoma Crocker M.D.   On: 04/13/2016 11:47    Medications:  Prior to Admission:  Prescriptions prior to admission  Medication Sig Dispense Refill Last Dose  . albuterol (PROAIR HFA) 108 (90 BASE) MCG/ACT inhaler Inhale 2 puffs into the lungs every 6 (six) hours as needed for wheezing or shortness of breath.   04/13/2016 at Unknown time  . albuterol (PROVENTIL) (2.5 MG/3ML) 0.083% nebulizer solution Take 2.5 mg by nebulization every 6 (six) hours as needed for wheezing or shortness of breath.   unknown  . ALPRAZolam (XANAX) 1 MG tablet Take 1 mg by mouth 4 (four) times daily as needed for anxiety or sleep.    04/12/2016 at Unknown time  . amLODipine (NORVASC) 10 MG tablet TAKE ONE TABLET BY MOUTH ONCE DAILY. 30 tablet 3 04/12/2016 at Unknown time  . aspirin 325 MG tablet Take 325 mg by mouth daily.   04/12/2016 at Unknown time  . benazepril (LOTENSIN) 10 MG tablet Take 1 tablet (10 mg total) by mouth daily. 90 tablet 3 04/12/2016 at Unknown time  . EFFIENT 10 MG TABS tablet TAKE (1) TABLET BY MOUTH EACH MORNING. 30 tablet 6 04/12/2016 at Unknown time  . ezetimibe (ZETIA) 10 MG tablet Take 1 tablet (10 mg total) by mouth daily. 30 tablet 3 04/12/2016 at Unknown time  . fluticasone (FLONASE) 50 MCG/ACT nasal spray Place 2 sprays into both nostrils daily as needed for allergies or rhinitis.   Past Week at Unknown time  . furosemide (LASIX) 80 MG tablet Take 80 mg by mouth daily as needed for fluid.   04/12/2016 at Unknown time  . HYDROcodone-acetaminophen (NORCO) 10-325 MG per tablet Take 1 tablet by mouth every 4 (four) hours as needed for moderate pain (pain).    04/13/2016 at Unknown time  . levETIRAcetam (KEPPRA) 250 MG tablet Take 250 mg by mouth 2 (two) times daily.   04/12/2016 at Unknown time  . levothyroxine (SYNTHROID, LEVOTHROID) 75 MCG tablet  Take 75 mcg by mouth daily before breakfast.    04/12/2016 at Unknown time  . metoprolol succinate (TOPROL-XL) 50 MG 24 hr tablet TAKE (1) TABLET BY MOUTH EACH MORNING WITH OR IMMEDIATELY FOLLOWING AMEAL. 30 tablet 6 04/12/2016 at 1000  . Multiple Vitamin (MULTIVITAMIN WITH MINERALS) TABS tablet Take 1 tablet by mouth 2 (two) times  daily. 25 tablet 4 04/12/2016 at Unknown time  . NITROSTAT 0.4 MG SL tablet DISSOLVE 1 TABLET UNDER TONGUE EVERY 5 MINUTES UP TO 15 MIN FOR CHESTPAIN. IF NO RELIEF CALL 911. 25 tablet 3 Past Week at Unknown time  . omega-3 acid ethyl esters (LOVAZA) 1 G capsule Take 2 capsules (2 g total) by mouth 2 (two) times daily. 60 capsule 11 04/12/2016 at Unknown time  . omeprazole (PRILOSEC) 20 MG capsule TAKE ONE BY MOUTH CAPSULE DAILY. 30 capsule 3 04/12/2016 at Unknown time  . potassium chloride SA (K-DUR,KLOR-CON) 20 MEQ tablet Take 20 mEq by mouth daily as needed (along with Furosemide).   04/12/2016 at Unknown time  . ranolazine (RANEXA) 500 MG 12 hr tablet Take 1 tablet (500 mg total) by mouth 2 (two) times daily. (Patient not taking: Reported on 04/13/2016) 60 tablet 11 Not Taking at Unknown time   Scheduled: . amLODipine  10 mg Oral Daily  . aspirin  325 mg Oral Daily  . benazepril  10 mg Oral Daily  . enoxaparin (LOVENOX) injection  40 mg Subcutaneous Q24H  . ezetimibe  10 mg Oral Daily  . levETIRAcetam  250 mg Oral BID  . levothyroxine  75 mcg Oral QAC breakfast  . metoprolol succinate  50 mg Oral Daily  . nitroGLYCERIN  1 inch Topical Q6H  . pantoprazole  40 mg Oral Daily  . prasugrel  10 mg Oral Daily   Continuous:  SYV:[id number], ALPRAZolam, butalbital-acetaminophen-caffeine, furosemide, gi cocktail, HYDROcodone-acetaminophen, ondansetron (ZOFRAN) IV  Assesment: She was admitted with chest pain. She has multiple cardiac abnormalities. She is complaining of headache related to nitroglycerin. She has multiple other medical problems but they seem relatively stable at this  point. She has been intentionally losing weight. Principal Problem:   Chest pain Active Problems:   CAD S/P multiple PCI after CABG   Angina pectoris ([place])    Plan: Add Fioricet. Continue other treatments. Discuss with cardiology as far as sorting out her medications versus side effects.      Jissell Trafton L 04/14/2016, 9:01 AM

## 2016-04-14 NOTE — Progress Notes (Signed)
Primary Cardiologist: Dina Rich MD  Cardiology Specific Problem List: 1. CAD with Chest Pain 2. Hypertension 3. Dyspnea   Subjective:    Feeling better. Chest pain resolved with NTG paste. Had H/A with this but this was resolved with Fioricet.   Objective:   Temp:  [97.4 F (36.3 C)-97.9 F (36.6 C)] 97.9 F (36.6 C) (07/11 1348) Pulse Rate:  [52-60] 52 (07/11 1348) Resp:  [16-18] 18 (07/11 1348) BP: (88-124)/(42-76) 121/66 mmHg (07/11 1447) SpO2:  [95 %-99 %] 98 % (07/11 1348) Last BM Date: 04/12/16  Filed Weights   04/13/16 1015 04/13/16 1535  Weight: 185 lb (83.915 kg) 192 lb (87.091 kg)    Intake/Output Summary (Last 24 hours) at 04/14/16 1600 Last data filed at 04/14/16 0933  Gross per 24 hour  Intake    480 ml  Output      0 ml  Net    480 ml   Telemetry: NSR   Exam:  General: No acute distress.  Lungs: Clear to auscultation, nonlabored.  Cardiac: No elevated JVP or bruits. RRR, no gallop or rub.   Extremities: No pitting edema, distal pulses full.  Neuropsychiatric: Alert and oriented x3, affect appropriate.  Lab Results:  Basic Metabolic Panel:  Recent Labs Lab 04/13/16 1144  NA 136  K 4.2  CL 103  CO2 25  GLUCOSE 114*  BUN 10  CREATININE 0.77  CALCIUM 9.1    CBC:  Recent Labs Lab 04/13/16 1144  WBC 7.4  HGB 12.6  HCT 37.6  MCV 91.9  PLT 303    Cardiac Enzymes:  Recent Labs Lab 04/13/16 1757 04/13/16 2354 04/14/16 0553  TROPONINI 0.03* 0.03* 0.03*   Radiology: Dg Chest 2 View  04/13/2016  CLINICAL DATA:  Shortness of breath starting 7 a.m. this morning EXAM: CHEST  2 VIEW COMPARISON:  06/03/2015 FINDINGS: Cardiomediastinal silhouette is stable. Status post CABG. No acute infiltrate or pleural effusion. No pulmonary edema. Mild degenerative changes thoracic spine. IMPRESSION: No active cardiopulmonary disease. Electronically Signed   By: Natasha Mead M.D.   On: 04/13/2016 11:47   Cardiac Cath  06/03/2015: 1. Mid Graft lesion, 30% stenosed. The lesion was previously treated with a stent (unknown type) .   Bypass graft failure with occlusion of the saphenous vein graft to the left anterior descending and the circumflex coronary artery.  Patent left internal mammary graft to the diagonal and patent saphenous vein graft to the distal right coronary. The stents within the mid to distal body of the right coronary saphenous vein graft or patent (multiple layers of stent within this region are noted).  Severe native vessel coronary disease with total occlusion of the proximal RCA, total occlusion of the left anterior descending and total occlusion of the mid circumflex. The first obtuse marginal supplies collaterals to the distal circumflex.  Left ventricular dysfunction with moderate inferior wall hypokinesis and overall ejection fraction of 35-40%.  RECOMMENDATIONS:   Continued aggressive risk factor modification  No anatomy amenable to percutaneous intervention.  Medications:   Scheduled Medications: . amLODipine  10 mg Oral Daily  . aspirin  325 mg Oral Daily  . benazepril  10 mg Oral Daily  . enoxaparin (LOVENOX) injection  40 mg Subcutaneous Q24H  . ezetimibe  10 mg Oral Daily  . isosorbide mononitrate  30 mg Oral Daily  . levETIRAcetam  250 mg Oral BID  . levothyroxine  75 mcg Oral QAC breakfast  . metoprolol succinate  25 mg Oral  Daily  . pantoprazole  40 mg Oral Daily  . prasugrel  10 mg Oral Daily    PRN Medications: albuterol, ALPRAZolam, butalbital-acetaminophen-caffeine, furosemide, gi cocktail, HYDROcodone-acetaminophen, ondansetron (ZOFRAN) IV   Assessment and Plan:   1. Chest Pain: Resolved with NTG paste. Troponin I does not suggest ACS. No acute ST changes by ECG. Will change NTG paste to Isosorbide 30 mg daily and titrate medical therapy as tolerated.  2. CAD: Hx of CABG: Continue ASA, Effient, Zetia, Toprol-XL, and now Imdur. No plans for IP ischemic  testing. Cardiac catheterization from August 2016 showed multivessel disease with graft disease as well and coronary anatomy not amenable to PCI. Plan will be for medical therapy. Wants to go home tomorrow.  3. Dyspnea:  No further complaints at this time.   4. Hypertension: Well controlled on current medication regimen  Toni MareKathryn M. Lawrence NP [facility]  04/14/2016, 4:00 PM   Attending note:  Chart reviewed as well as consultation note by Dr. Tenny Crawoss. Modified above noted by Ms. Lawrence NP. [name] presents with recurring chest pain symptoms, no definitive ACS by troponin I levels or ECG however. She has significant multivessel disease as well as graft disease status post CABG, cardiac catheterization from August 2016 is outlined above. We have transitioned from nitroglycerin paste to Imdur. Plan to continue regimen including aspirin, Effient, Zetia, and Toprol-XL otherwise. Continue to titrate medical therapy as tolerated. Ranexa would be another consideration. No further inpatient ischemic testing is planned at this time.  Jonelle SidleSamuel G. McDowell, M.D., F.A.C.C.

## 2016-04-15 DIAGNOSIS — R569 Unspecified convulsions: Secondary | ICD-10-CM

## 2016-04-15 MED ORDER — ISOSORBIDE MONONITRATE ER 30 MG PO TB24: 30.0000 mg | ORAL_TABLET | Freq: Every day | ORAL | Status: DC

## 2016-04-15 MED ORDER — BUTALBITAL-APAP-CAFFEINE 50-325-40 MG PO TABS: 1.0000 | ORAL_TABLET | ORAL | Status: DC | PRN

## 2016-04-15 NOTE — Progress Notes (Signed)
Subjective: She feels better. She still has headache. No invasive testing is planned this admission.  Objective: Vital signs in last 24 hours: Temp:  [97.8 F (36.6 C)-98 F (36.7 C)] 97.8 F (36.6 C) (07/12 0628) Pulse Rate:  [50-60] 60 (07/12 0628) Resp:  [17-18] 17 (07/12 0628) BP: (88-121)/(42-67) 108/67 mmHg (07/12 0628) SpO2:  [97 %-98 %] 98 % (07/12 0628) Weight change:  Last BM Date: 04/12/16  Intake/Output from previous day: 07/11 0701 - 07/12 0700 In: 480 [P.O.:480] Out: -   PHYSICAL EXAM General appearance: alert, cooperative and no distress Resp: clear to auscultation bilaterally Cardio: regular rate and rhythm, S1, S2 normal, no murmur, click, rub or gallop GI: soft, non-tender; bowel sounds normal; no masses,  no organomegaly Extremities: extremities normal, atraumatic, no cyanosis or edema  Lab Results:  Results for orders placed or performed during the hospital encounter of 04/13/16 (from the past 48 hour(s))  CBC     Status: None   Collection Time: 04/13/16 11:44 AM  Result Value Ref Range   WBC 7.4 4.0 - 10.5 K/uL   RBC 4.09 3.87 - 5.11 MIL/uL   Hemoglobin 12.6 12.0 - 15.0 g/dL   HCT 37.6 36.0 - 46.0 %   MCV 91.9 78.0 - 100.0 fL   MCH 30.8 26.0 - 34.0 pg   MCHC 33.5 30.0 - 36.0 g/dL   RDW 12.9 11.5 - 15.5 %   Platelets 303 150 - 400 K/uL  Basic metabolic panel     Status: Abnormal   Collection Time: 04/13/16 11:44 AM  Result Value Ref Range   Sodium 136 135 - 145 mmol/L   Potassium 4.2 3.5 - 5.1 mmol/L   Chloride 103 101 - 111 mmol/L   CO2 25 22 - 32 mmol/L   Glucose, Bld 114 (H) 65 - 99 mg/dL   BUN 10 6 - 20 mg/dL   Creatinine, Ser 0.77 0.44 - 1.00 mg/dL   Calcium 9.1 8.9 - 10.3 mg/dL   GFR calc non Af Amer >60 >60 mL/min   GFR calc Af Amer >60 >60 mL/min    Comment: (NOTE) The eGFR has been calculated using the CKD EPI equation. This calculation has not been validated in all clinical situations. eGFR's persistently <60 mL/min signify  possible Chronic Kidney Disease.    Anion gap 8 5 - 15  Troponin I  (0, 3, 6)     Status: None   Collection Time: 04/13/16 11:44 AM  Result Value Ref Range   Troponin I <0.03 <0.03 ng/mL  Protime-INR     Status: None   Collection Time: 04/13/16 11:44 AM  Result Value Ref Range   Prothrombin Time 14.0 11.6 - 15.2 seconds   INR 1.06 0.00 - 1.49  APTT     Status: None   Collection Time: 04/13/16 11:44 AM  Result Value Ref Range   aPTT 30 24 - 37 seconds  Troponin I  (0, 3, 6)     Status: Abnormal   Collection Time: 04/13/16  2:06 PM  Result Value Ref Range   Troponin I 0.03 (HH) <0.03 ng/mL    Comment: CRITICAL RESULT CALLED TO, READ BACK BY AND VERIFIED WITH: DICKERSON,M ON 04/13/16 AT 1505 BY LOY,C   Troponin I (q 6hr x 3)     Status: Abnormal   Collection Time: 04/13/16  5:57 PM  Result Value Ref Range   Troponin I 0.03 (HH) <0.03 ng/mL    Comment: DELTA CHECK NOTED CRITICAL VALUE NOTED.  VALUE  IS CONSISTENT WITH PREVIOUSLY REPORTED AND CALLED VALUE.   Troponin I (q 6hr x 3)     Status: Abnormal   Collection Time: 04/13/16 11:54 PM  Result Value Ref Range   Troponin I 0.03 (HH) <0.03 ng/mL    Comment: DELTA CHECK NOTED CRITICAL VALUE NOTED.  VALUE IS CONSISTENT WITH PREVIOUSLY REPORTED AND CALLED VALUE.   Troponin I (q 6hr x 3)     Status: Abnormal   Collection Time: 04/14/16  5:53 AM  Result Value Ref Range   Troponin I 0.03 (HH) <0.03 ng/mL    Comment: CRITICAL VALUE NOTED.  VALUE IS CONSISTENT WITH PREVIOUSLY REPORTED AND CALLED VALUE.    ABGS No results for input(s): PHART, PO2ART, TCO2, HCO3 in the last 72 hours.  Invalid input(s): PCO2 CULTURES No results found for this or any previous visit (from the past 240 hour(s)). Studies/Results: Dg Chest 2 View  04/13/2016  CLINICAL DATA:  Shortness of breath starting 7 a.m. this morning EXAM: CHEST  2 VIEW COMPARISON:  06/03/2015 FINDINGS: Cardiomediastinal silhouette is stable. Status post CABG. No acute infiltrate  or pleural effusion. No pulmonary edema. Mild degenerative changes thoracic spine. IMPRESSION: No active cardiopulmonary disease. Electronically Signed   By: Lahoma Crocker M.D.   On: 04/13/2016 11:47    Medications:  Prior to Admission:  Prescriptions prior to admission  Medication Sig Dispense Refill Last Dose  . albuterol (PROAIR HFA) 108 (90 BASE) MCG/ACT inhaler Inhale 2 puffs into the lungs every 6 (six) hours as needed for wheezing or shortness of breath.   04/13/2016 at Unknown time  . albuterol (PROVENTIL) (2.5 MG/3ML) 0.083% nebulizer solution Take 2.5 mg by nebulization every 6 (six) hours as needed for wheezing or shortness of breath.   unknown  . ALPRAZolam (XANAX) 1 MG tablet Take 1 mg by mouth 4 (four) times daily as needed for anxiety or sleep.    04/12/2016 at Unknown time  . amLODipine (NORVASC) 10 MG tablet TAKE ONE TABLET BY MOUTH ONCE DAILY. 30 tablet 3 04/12/2016 at Unknown time  . aspirin 325 MG tablet Take 325 mg by mouth daily.   04/12/2016 at Unknown time  . benazepril (LOTENSIN) 10 MG tablet Take 1 tablet (10 mg total) by mouth daily. 90 tablet 3 04/12/2016 at Unknown time  . EFFIENT 10 MG TABS tablet TAKE (1) TABLET BY MOUTH EACH MORNING. 30 tablet 6 04/12/2016 at Unknown time  . ezetimibe (ZETIA) 10 MG tablet Take 1 tablet (10 mg total) by mouth daily. 30 tablet 3 04/12/2016 at Unknown time  . fluticasone (FLONASE) 50 MCG/ACT nasal spray Place 2 sprays into both nostrils daily as needed for allergies or rhinitis.   Past Week at Unknown time  . furosemide (LASIX) 80 MG tablet Take 80 mg by mouth daily as needed for fluid.   04/12/2016 at Unknown time  . HYDROcodone-acetaminophen (NORCO) 10-325 MG per tablet Take 1 tablet by mouth every 4 (four) hours as needed for moderate pain (pain).    04/13/2016 at Unknown time  . levETIRAcetam (KEPPRA) 250 MG tablet Take 250 mg by mouth 2 (two) times daily.   04/12/2016 at Unknown time  . levothyroxine (SYNTHROID, LEVOTHROID) 75 MCG tablet Take 75 mcg  by mouth daily before breakfast.    04/12/2016 at Unknown time  . metoprolol succinate (TOPROL-XL) 50 MG 24 hr tablet TAKE (1) TABLET BY MOUTH EACH MORNING WITH OR IMMEDIATELY FOLLOWING AMEAL. 30 tablet 6 04/12/2016 at 1000  . Multiple Vitamin (MULTIVITAMIN WITH MINERALS) TABS  tablet Take 1 tablet by mouth 2 (two) times daily. 25 tablet 4 04/12/2016 at Unknown time  . NITROSTAT 0.4 MG SL tablet DISSOLVE 1 TABLET UNDER TONGUE EVERY 5 MINUTES UP TO 15 MIN FOR CHESTPAIN. IF NO RELIEF CALL 911. 25 tablet 3 Past Week at Unknown time  . omega-3 acid ethyl esters (LOVAZA) 1 G capsule Take 2 capsules (2 g total) by mouth 2 (two) times daily. 60 capsule 11 04/12/2016 at Unknown time  . omeprazole (PRILOSEC) 20 MG capsule TAKE ONE BY MOUTH CAPSULE DAILY. 30 capsule 3 04/12/2016 at Unknown time  . potassium chloride SA (K-DUR,KLOR-CON) 20 MEQ tablet Take 20 mEq by mouth daily as needed (along with Furosemide).   04/12/2016 at Unknown time  . ranolazine (RANEXA) 500 MG 12 hr tablet Take 1 tablet (500 mg total) by mouth 2 (two) times daily. (Patient not taking: Reported on 04/13/2016) 60 tablet 11 Not Taking at Unknown time   Scheduled: . amLODipine  10 mg Oral Daily  . aspirin  325 mg Oral Daily  . benazepril  10 mg Oral Daily  . enoxaparin (LOVENOX) injection  40 mg Subcutaneous Q24H  . ezetimibe  10 mg Oral Daily  . isosorbide mononitrate  30 mg Oral Daily  . levETIRAcetam  250 mg Oral BID  . levothyroxine  75 mcg Oral QAC breakfast  . metoprolol succinate  25 mg Oral Daily  . pantoprazole  40 mg Oral Daily  . prasugrel  10 mg Oral Daily   Continuous:  XFG:[id number], ALPRAZolam, butalbital-acetaminophen-caffeine, furosemide, gi cocktail, HYDROcodone-acetaminophen, ondansetron (ZOFRAN) IV  Assesment: She was admitted with chest pain. She ruled out for MI. She feels better. She is on long-acting nitrates she's having some headache from that. Since no further invasive testing is planned I'm going to send her home  today to up titrate her medical treatment for coronary disease as an outpatient Principal Problem:   Chest pain Active Problems:   CAD S/P multiple PCI after CABG   Angina pectoris [facility])    Plan: Discharge home today      Ozelle Brubacher L 04/15/2016, 8:44 AM

## 2016-04-15 NOTE — Progress Notes (Signed)
Discharge instructions and prescription given, verbalized understanding, out in stable condition ambulatory with staff. 

## 2016-04-15 NOTE — Discharge Summary (Signed)
Physician Discharge Summary  Patient ID: [name] MRN: [id number] DOB/AGE: 02/21/1956 60 y.o. Primary Care Physician:Kais Monje L, MD Admit date: 04/13/2016 Discharge date: 04/15/2016    Discharge Diagnoses:   Principal Problem:   Chest pain Active Problems:   Essential hypertension   Hx of CABG 2005   Chronic obstructive pulmonary disease (COPD) (HCC)   CAD S/P multiple PCI after CABG   Hypothyroidism   Ischemic cardiomyopathy   Angina pectoris (HCC)   Convulsions (HCC) Headache related to nitrates    Medication List    STOP taking these medications        ranolazine 500 MG 12 hr tablet  Commonly known as:  RANEXA      TAKE these medications        ALPRAZolam 1 MG tablet  Commonly known as:  XANAX  Take 1 mg by mouth 4 (four) times daily as needed for anxiety or sleep.     amLODipine 10 MG tablet  Commonly known as:  NORVASC  TAKE ONE TABLET BY MOUTH ONCE DAILY.     aspirin 325 MG tablet  Take 325 mg by mouth daily.     benazepril 10 MG tablet  Commonly known as:  LOTENSIN  Take 1 tablet (10 mg total) by mouth daily.     butalbital-acetaminophen-caffeine 50-325-40 MG tablet  Commonly known as:  FIORICET, ESGIC  Take 1 tablet by mouth every 4 (four) hours as needed for headache.     EFFIENT 10 MG Tabs tablet  Generic drug:  prasugrel  TAKE (1) TABLET BY MOUTH EACH MORNING.     ezetimibe 10 MG tablet  Commonly known as:  ZETIA  Take 1 tablet (10 mg total) by mouth daily.     fluticasone 50 MCG/ACT nasal spray  Commonly known as:  FLONASE  Place 2 sprays into both nostrils daily as needed for allergies or rhinitis.     furosemide 80 MG tablet  Commonly known as:  LASIX  Take 80 mg by mouth daily as needed for fluid.     HYDROcodone-acetaminophen 10-325 MG tablet  Commonly known as:  NORCO  Take 1 tablet by mouth every 4 (four) hours as needed for moderate pain (pain).     isosorbide mononitrate 30 MG 24 hr tablet  Commonly known as:   IMDUR  Take 1 tablet (30 mg total) by mouth daily.     levETIRAcetam 250 MG tablet  Commonly known as:  KEPPRA  Take 250 mg by mouth 2 (two) times daily.     levothyroxine 75 MCG tablet  Commonly known as:  SYNTHROID, LEVOTHROID  Take 75 mcg by mouth daily before breakfast.     metoprolol succinate 50 MG 24 hr tablet  Commonly known as:  TOPROL-XL  TAKE (1) TABLET BY MOUTH EACH MORNING WITH OR IMMEDIATELY FOLLOWING AMEAL.     multivitamin with minerals Tabs tablet  Take 1 tablet by mouth 2 (two) times daily.     NITROSTAT 0.4 MG SL tablet  Generic drug:  nitroGLYCERIN  DISSOLVE 1 TABLET UNDER TONGUE EVERY 5 MINUTES UP TO 15 MIN FOR CHESTPAIN. IF NO RELIEF CALL 911.     omega-3 acid ethyl esters 1 g capsule  Commonly known as:  LOVAZA  Take 2 capsules (2 g total) by mouth 2 (two) times daily.     omeprazole 20 MG capsule  Commonly known as:  PRILOSEC  TAKE ONE BY MOUTH CAPSULE DAILY.     potassium chloride SA 20 MEQ tablet  Commonly known as:  K-DUR,KLOR-CON  Take 20 mEq by mouth daily as needed (along with Furosemide).     albuterol (2.5 MG/3ML) 0.083% nebulizer solution  Commonly known as:  PROVENTIL  Take 2.5 mg by nebulization every 6 (six) hours as needed for wheezing or shortness of breath.     PROAIR HFA 108 (90 Base) MCG/ACT inhaler  Generic drug:  albuterol  Inhale 2 puffs into the lungs every 6 (six) hours as needed for wheezing or shortness of breath.        Discharged Condition:Improved    Consults: Cardiology  Significant Diagnostic Studies: Dg Chest 2 View  04/13/2016  CLINICAL DATA:  Shortness of breath starting 7 a.m. this morning EXAM: CHEST  2 VIEW COMPARISON:  06/03/2015 FINDINGS: Cardiomediastinal silhouette is stable. Status post CABG. No acute infiltrate or pleural effusion. No pulmonary edema. Mild degenerative changes thoracic spine. IMPRESSION: No active cardiopulmonary disease. Electronically Signed   By: Natasha Mead M.D.   On: 04/13/2016  11:47    Lab Results: Basic Metabolic Panel:  Recent Labs  91/47/82 1144  NA 136  K 4.2  CL 103  CO2 25  GLUCOSE 114*  BUN 10  CREATININE 0.77  CALCIUM 9.1   Liver Function Tests: No results for input(s): AST, ALT, ALKPHOS, BILITOT, PROT, ALBUMIN in the last 72 hours.   CBC:  Recent Labs  04/13/16 1144  WBC 7.4  HGB 12.6  HCT 37.6  MCV 91.9  PLT 303    No results found for this or any previous visit (from the past 240 hour(s)).   Hospital Course: This is a 60 year old with known coronary disease who came to the emergency department with chest pain. She was treated but continued to have difficulty and was brought into the hospital for rule out MI protocol. She did rule out but there were concerns because she is known to have residual disease despite having had  bypass surgery. She was on nitroglycerin paste and then was placed on Imdur. She had headache from that but no further chest pain. Cardiology consultation was obtained. It was not felt that she needed invasive testing while in the hospital and she was discharged home in improved condition. Her medications will be titrated as an outpatient Discharge Exam: Blood pressure 108/67, pulse 60, temperature 97.8 F (36.6 C), temperature source Oral, resp. rate 17, height  (1.6 m), weight 87.091 kg (192 lb), SpO2 98 %. She is awake and alert. Her chest is clear. Her heart is regular.  Disposition: Home follow up with cardiology for up titration of medications if possible follow-up in my office      Discharge Instructions    Discharge patient    Complete by:  As directed              Signed: Eureka Valdes L   04/15/2016, 8:53 AM

## 2016-05-18 ENCOUNTER — Other Ambulatory Visit: Payer: Self-pay | Admitting: Adult Health

## 2016-05-28 ENCOUNTER — Other Ambulatory Visit (HOSPITAL_COMMUNITY): Payer: Self-pay | Admitting: Pulmonary Disease

## 2016-05-28 ENCOUNTER — Ambulatory Visit (HOSPITAL_COMMUNITY)
Admission: RE | Admit: 2016-05-28 | Discharge: 2016-05-28 | Disposition: A | Discharge status: Home / Self Care | Payer: Medicaid Other | Source: Ambulatory Visit | Attending: Pulmonary Disease | Admitting: Pulmonary Disease

## 2016-05-28 DIAGNOSIS — M545 Low back pain: Secondary | ICD-10-CM | POA: Diagnosis not present

## 2016-05-28 DIAGNOSIS — M5136 Other intervertebral disc degeneration, lumbar region: Secondary | ICD-10-CM | POA: Insufficient documentation

## 2016-05-28 DIAGNOSIS — I701 Atherosclerosis of renal artery: Secondary | ICD-10-CM | POA: Insufficient documentation

## 2016-05-28 DIAGNOSIS — I700 Atherosclerosis of aorta: Secondary | ICD-10-CM | POA: Diagnosis not present

## 2016-06-01 ENCOUNTER — Other Ambulatory Visit: Payer: Self-pay | Admitting: Cardiology

## 2016-06-09 ENCOUNTER — Other Ambulatory Visit: Payer: Self-pay | Admitting: Cardiology

## 2016-06-16 ENCOUNTER — Other Ambulatory Visit: Payer: Self-pay | Admitting: Adult Health

## 2016-06-29 ENCOUNTER — Other Ambulatory Visit: Payer: Self-pay | Admitting: Cardiology

## 2016-07-01 ENCOUNTER — Other Ambulatory Visit: Payer: Self-pay | Admitting: Cardiology

## 2016-07-02 NOTE — Progress Notes (Signed)
This encounter was created in error - please disregard.

## 2016-08-24 ENCOUNTER — Other Ambulatory Visit: Payer: Self-pay | Admitting: Cardiology

## 2016-09-09 ENCOUNTER — Other Ambulatory Visit: Payer: Self-pay | Admitting: Adult Health

## 2016-09-10 ENCOUNTER — Other Ambulatory Visit: Payer: Self-pay | Admitting: Cardiology

## 2016-09-30 ENCOUNTER — Other Ambulatory Visit: Payer: Self-pay | Admitting: Cardiology

## 2016-10-06 ENCOUNTER — Other Ambulatory Visit: Payer: Self-pay | Admitting: Adult Health

## 2016-10-27 ENCOUNTER — Other Ambulatory Visit: Payer: Self-pay | Admitting: *Deleted

## 2016-10-27 DIAGNOSIS — R002 Palpitations: Secondary | ICD-10-CM

## 2016-10-29 ENCOUNTER — Other Ambulatory Visit: Payer: Self-pay | Admitting: Cardiology

## 2016-11-03 ENCOUNTER — Ambulatory Visit: Payer: Self-pay | Admitting: Cardiology

## 2016-11-03 NOTE — Progress Notes (Deleted)
Clinical Summary [name] is a 62 y.o.female seen today for follow up of the following medical problems.    1. CAD  - CABG 2005 4 vessel  last cath 05/2015 results as reported below, no targets for pci, recs for medical management. Was started on ranexa 531m bid, had headache on nitrates. She attributes that episode to severe stress, had lost a very close friend at that time.   - denies any chest pain since starting ranexa. No SOB or DOE, she has lost 23 lbs since Nov 2016 - compliant with meds  2. Carotid stenosis  - Carotid [place] 40-59% bilateral - denies any recent neuro symptoms    3. HTN  - compliant meds  - can have some lightheadness and dizziness, occurs with standing.   4. Hyperlipidemia  - compliant with statin  - 03/2014 lipid panel: TC 174 TG 293 HDL 36 LDL 79 - did not tolerate crestor, prava, or lovastatin due to side effects.    5. Preoperative evaluation -she is being considered for an ortho procedure on her hand   Past Medical History:  Diagnosis Date  . Acute renal failure ([place]   . Anemia   . Anxiety   . Arthritis   . Asthma   . Carotid artery disease ([place]    a. Duplex 50-69% BICA 03/2014.  .Marland KitchenChronic back pain   . COPD (ch[place] obstructive pulmonary disease) ([place]   . Coronary artery disease    a. s/p prior CABG 2005. b. BMS 02/2009 to VG-PDA. c. NSTEMI 08/2010 s/p PTCA/DES to SVG-PDA in previously stented area. d. DES to SVG to RCA on 05/24/14  c. LHC 06/04/2015 w/ no sig change in her anatomy and Rx managment  . DDD (degenerative disc disease)   . Depression   . Diverticulitis    4 documented episodes by CT; most recent April 2012  . GERD (gastroesophageal reflux disease)   . History of blood transfusion   . HTN (hypertension)   . Hyperlipidemia   . Hypertension   . Hypothyroidism   . Ischemic cardiomyopathy    a. EF 40% in 2012 by cath. b. improved to 50-55% by cath 05/2014  [facility]  . Kidney stones    . Seizures ([place]   . Sinus bradycardia   . Sleep apnea    a. Mild-to-moderate obstructive sleep apnea diagnosed in April 2012 by sleep study.  . Tension headache      Allergies  Allergen Reactions  . Pneumococcal Vaccines Anxiety, Palpitations and Cough  . Adhesive [Tape] Itching and Other (See Comments)    Blisters (pls use paper tape)  . Crestor [Rosuvastatin] Other (See Comments)    Muscle pain  . Lipitor [Atorvastatin] Other (See Comments)    Cramps     Current Outpatient Prescriptions  Medication Sig Dispense Refill  . albuterol (PROAIR HFA) 108 (90 BASE) MCG/ACT inhaler Inhale 2 puffs into the lungs every 6 (six) hours as needed for wheezing or shortness of breath.    .Marland Kitchenalbuterol (PROVENTIL) (2.5 MG/3ML) 0.083% nebulizer solution Take 2.5 mg by nebulization every 6 (six) hours as needed for wheezing or shortness of breath.    . ALPRAZolam (XANAX) 1 MG tablet Take 1 mg by mouth 4 (four) times daily as needed for anxiety or sleep.     .Marland KitchenamLODipine (NORVASC) 10 MG tablet TAKE ONE TABLET BY MOUTH ONCE DAILY. 30 tablet 0  . aspirin 325 MG tablet Take 325  mg by mouth daily.    . benazepril (LOTENSIN) 10 MG tablet Take 1 tablet (10 mg total) by mouth daily. 90 tablet 3  . butalbital-acetaminophen-caffeine (FIORICET, ESGIC) 50-325-40 MG tablet Take 1 tablet by mouth every 4 (four) hours as needed for headache. 14 tablet 0  . EFFIENT 10 MG TABS tablet TAKE (1) TABLET BY MOUTH EACH MORNING. 30 tablet 0  . ezetimibe (ZETIA) 10 MG tablet TAKE ONE TABLET BY MOUTH ONCE DAILY. 15 tablet 0  . fluticasone (FLONASE) 50 MCG/ACT nasal spray Place 2 sprays into both nostrils daily as needed for allergies or rhinitis.    . furosemide (LASIX) 80 MG tablet Take 80 mg by mouth daily as needed for fluid.    Marland Kitchen HYDROcodone-acetaminophen (NORCO) 10-325 MG per tablet Take 1 tablet by mouth every 4 (four) hours as needed for moderate pain (pain).     . isosorbide mononitrate (IMDUR) 30 MG 24 hr tablet  Take 1 tablet (30 mg total) by mouth daily. 30 tablet 12  . levETIRAcetam (KEPPRA) 250 MG tablet Take 250 mg by mouth 2 (two) times daily.    Marland Kitchen levothyroxine (SYNTHROID, LEVOTHROID) 75 MCG tablet Take 75 mcg by mouth daily before breakfast.     . metoprolol succinate (TOPROL-XL) 50 MG 24 hr tablet TAKE (1) TABLET BY MOUTH EACH MORNING WITH OR IMMEDIATELY FOLLOWING AMEAL. 30 tablet 6  . Multiple Vitamin (MULTIVITAMIN WITH MINERALS) TABS tablet Take 1 tablet by mouth 2 (two) times daily. 25 tablet 4  . nitroGLYCERIN (NITROSTAT) 0.4 MG SL tablet DISSOLVE 1 TABLET UNDER TONGUE EVERY 5 MINUTES UP TO 15 MIN FOR CHESTPAIN. IF NO RELIEF CALL 911. 25 tablet 3  . omega-3 acid ethyl esters (LOVAZA) 1 G capsule Take 2 capsules (2 g total) by mouth 2 (two) times daily. 60 capsule 11  . omeprazole (PRILOSEC) 20 MG capsule TAKE (1) CAPSULE BY MOUTH ONCE DAILY. 30 capsule 3  . potassium chloride SA (K-DUR,KLOR-CON) 20 MEQ tablet TAKE (1) TABLET BY MOUTH EACH MORNING. 30 tablet 3   No current facility-administered medications for this visit.      Past Surgical History:  Procedure Laterality Date  . APPENDECTOMY  ~ 1970  . CARDIAC CATHETERIZATION  Jan 2012   patent stents in RCA graft; CAD stable  . CARDIAC CATHETERIZATION N/A 06/03/2015   Procedure: Left Heart Cath and Cors/Grafts Angiography;  Surgeon: Belva Crome, MD;  Location: [facility];  Service: Cardiovascular;  Laterality: N/A;  . CAROTID ENDARTERECTOMY  2005  . CARPAL TUNNEL WITH CUBITAL TUNNEL Right 2001  . CORONARY ANGIOPLASTY WITH STENT PLACEMENT  2011   Promus Premier DES, 3.5 mm x 12 mm, for 99% proximal stent ISR in the SVG-RPDA   . CORONARY ANGIOPLASTY WITH STENT PLACEMENT  05/24/2014  . CORONARY ARTERY BYPASS GRAFT  2005   LIMA to AL, SVG to LAD, SVG to Cx, SVG-PDA  . KNEE ARTHROSCOPY W/ ACL RECONSTRUCTION Left 2001  . LAPAROSCOPIC CHOLECYSTECTOMY  2010  . LEFT HEART CATHETERIZATION WITH CORONARY/GRAFT ANGIOGRAM N/A 05/24/2014    Procedure: LEFT HEART CATHETERIZATION WITH Beatrix Fetters;  Surgeon: Leonie Man, MD; EF 50-55%, LAD & D1 100%, LIMA-D1 OK, CFX OK, OM1 & OM2 100%, lat OM branch 100%, RCA 100%; pSVG-RPDA 99% rx w/ scoring balloon PTCA & 3.5 x 12 mm Promus DES; SVG-LAD, SVG-OM known occluded      . TUBAL LIGATION  ~ 1989  . VAGINAL HYSTERECTOMY  ~ 1993     Allergies  Allergen  Reactions  . Pneumococcal Vaccines Anxiety, Palpitations and Cough  . Adhesive [Tape] Itching and Other (See Comments)    Blisters (pls use paper tape)  . Crestor [Rosuvastatin] Other (See Comments)    Muscle pain  . Lipitor [Atorvastatin] Other (See Comments)    Cramps      Family History  Problem Relation Age of Onset  . Heart attack Mother   . Heart disease Mother   . Breast cancer Mother   . Heart attack Father   . Heart disease Father   . Colon cancer Neg Hx      Social History Ms. Hemmingway reports that she quit smoking about 15 months ago. Her smoking use included Cigarettes. She started smoking about 23 years ago. She has a 10.50 pack-year smoking history. She has never used smokeless tobacco. Ms. Hewlett reports that she does not drink alcohol.   Review of Systems CONSTITUTIONAL: No weight loss, fever, chills, weakness or fatigue.  HEENT: Eyes: No visual loss, blurred vision, double vision or yellow sclerae.No hearing loss, sneezing, congestion, runny nose or sore throat.  SKIN: No rash or itching.  CARDIOVASCULAR:  RESPIRATORY: No shortness of breath, cough or sputum.  GASTROINTESTINAL: No anorexia, nausea, vomiting or diarrhea. No abdominal pain or blood.  GENITOURINARY: No burning on urination, no polyuria NEUROLOGICAL: No headache, dizziness, syncope, paralysis, ataxia, numbness or tingling in the extremities. No change in bowel or bladder control.  MUSCULOSKELETAL: No muscle, back pain, joint pain or stiffness.  LYMPHATICS: No enlarged nodes. No history of splenectomy.  PSYCHIATRIC: No history of  depression or anxiety.  ENDOCRINOLOGIC: No reports of sweating, cold or heat intolerance. No polyuria or polydipsia.  Marland Kitchen   Physical Examination There were no vitals filed for this visit. There were no vitals filed for this visit.  Gen: resting comfortably, no acute distress HEENT: no scleral icterus, pupils equal round and reactive, no palptable cervical adenopathy,  CV Resp: Clear to auscultation bilaterally GI: abdomen is soft, non-tender, non-distended, normal bowel sounds, no hepatosplenomegaly MSK: extremities are warm, no edema.  Skin: warm, no rash Neuro:  no focal deficits Psych: appropriate affect   Diagnostic Studies Cath 2012  Left main coronary was normal.  Left anterior descending artery was occluded just after the septal  perforator. First diagonal branch was subtotally occluded. There was  some filling of the distal LAD and diagonals from collateral branches  from the LIMA. Circumflex coronary artery was occluded in the  midvessel. The AV groove branch was patent. There was a small first  obtuse marginal branch with 30-40% multiple lesions. There was a small  second obtuse marginal branch with 30-40% discrete lesions. There is a  very large obtuse marginal branch seen filling from collaterals from the  left internal mammary artery.  Right coronary artery was 100% occluded proximally with both left-to-  right and right-to-right collaterals. The distal PDA also filled from  the vein graft to the right coronary artery.  The left internal mammary artery was widely patent. It also filled the  distal right large obtuse marginal branch and filled retrograde to a  smaller diagonal branch.  There was known occlusion of the 2 left-sided vein grafts. The  saphenous vein graft to the right coronary artery was widely patent.  Bare-metal stent in the mid-to-distal vessel widely patent. There was  poor runoff to the vessel primarily being retrograde into  the posterior  lateral branch, however, there was no new focal stenosis.  RAO ventriculography. RAO ventriculography showed anterior  apical and  mid inferior wall hypokinesis. EF was in the 40% range.  Right heart catheterization showed mean pulmonary capillary wedge  pressure of 24, PA pressure was 55/26, RV pressure was 52/5, RA pressure  mean was 16, aortic pressure was 155/76, LV pressure was equal to 167/16  with a post A-wave EDP of 26.  Cardiac output was 6.3 liters per minute with a cardiac index of 3  liters per minute per meter squared by Fick.   IMPRESSION: The patient's coronary artery disease is stable. The  stents in the RCA graft were patent. There are no new lesions that I  can see. She does have collateralized distal right and OM branch.  Continued medical therapy is warranted. If she continues to have chest  pain, Ranexa may be in order since it is somewhat late in the day and  she had both venous and arterial sheath. She will be kept overnight and  discharged in the morning. She tolerated the procedure well.   03/2014 Echo  Study Conclusions  - Left ventricle: The cavity size was normal. Wall thickness was increased in a pattern of mild LVH. Systolic function was normal. The estimated ejection fraction was in the range of 50% to 55%. There is akinesis of the basalinferolateral myocardium. Features are consistent with a pseudonormal left ventricular filling pattern, with concomitant abnormal relaxation and increased filling pressure (grade 2 diastolic dysfunction). - Aortic valve: Mildly calcified annulus. Trileaflet. There was no significant regurgitation. - Mitral valve: There was trivial regurgitation. - Left atrium: The atrium was mildly to moderately dilated. - Right ventricle: Systolic function was mildly reduced. - Right atrium: Central venous pressure (est): 3 mm Hg. - Atrial septum: No defect or patent foramen ovale was identified. -  Tricuspid valve: There was trivial regurgitation. - Pulmonary arteries: Systolic pressure could not be accurately estimated. - Pericardium, extracardiac: There was no pericardial effusion.  Impressions:  - Mild LVH with LVEF 50-55%, basal inferolateral hypokinesis, grade 2 diastolic dysfunction. Mild to moderate left atrial enlargement. Mildly reduced RV contraction. Unable to assess PASP.  03/2014 Carotid US  IMPRESSION:  Bilateral 50-69% stenosis. The left internal carotid peak systolic  velocity has increased slightly in the interval from the prior exam.  05/2014 Cath FINDINGS:  Hemodynamics:  1. Central Aortic Pressure / Mean: 110/62/81 mmHg 2. Left Ventricular Pressure / LVEDP: 110/14/21 mmHg  Left Ventriculography:  EF: 50 at 55 %  Wall Motion: Severe hypokinesis of the Basal to MID inferior-inferolateraL wall with mild hypokinesis of the basal anterior wall  Coronary Anatomy:  Dominance: Right  Left Main: Normal caliber vessel that itself is free of significant disease. Bifurcates into the LAD and Circumflex LAD: Moderate caliber vessel that is 100% occluded after a septal perforator and small diagonal branch. There is a first diagonal branch that is also half percent occluded after initial subtotal occlusion.  Left Circumflex: Moderate caliber vessel it gives rise to a proximal small caliber OM branch that is somewhat tortuous and free of significant disease. The vessel then bifurcates into the introducer complex which is small in caliber and perfuses the basal inferolateral wall. There is a major lateral OM branch the visualized the proximal segment is occluded. There is late retrograde filling of the remainder the nature OM which appears to have 2 branches distally. This is faint collaterals from the proximal OM.   RCA: 100% proximal occluded. SVG-RPDA: Large-caliber graft with mildly irregular is proximally, there are overlapping stents in the distal mid  graft with 99% napkin ring proximal in-stent restenosis of the most proximal stent that extends just minimally to the unstented segment. The remainder of the graft is relatively free of disease and attached to the mid RPDA.  RPDA: Large-caliber tortuous vessel that reaches almost to the apex.  RPL Sysytem:The RPAV fills via retrograde flow in the RPDA. There are several small posterolateral branches that are relatively free of disease. The PAV is relatively free of disease.  After reviewing the initial angiography, the culprit lesion was thought to be the 99% proximal stent ISR in the SVG-RPDA. Preparation were made to proceed with PCI on this lesion. As the lesion is focal & appears to be fibrous in nature and too stenosed to safely pass a filter wire, the decision was made to pre-dilate & stent without distal protection. No evidence of a filling defect was noted besides the "napkin-ring" lesion.  Percutaneous Coronary Intervention: Sheath exchanged for 6 Fr Guide: 6 Fr AL1Guidewire: BMW (after failed attempt with Pro-Water Predilation Balloon: Angioscult Cutting Balloon 3.5 mm x 10 mm;  ? 12 Atm x 45 Sec,-- excellent predilation of the fibrous lesion at high pressure. Brisk TIMI-3 flow down stream but no sign of no reflow. The decision was made to proceed with stenting. Stent: Promus Premier DES 3.5 mm x 12 mm; overlaps roughly 4 mm into the original stent. ? Deployed at: 20 Atm x 30 Sec Post-dilation Balloon: [place] Emerge 4.0 mm x 8 mm;  ? 14 Atm x 30 Sec x 2 -  ? Final Diameter: 4.1 mm  Post deployment angiography in multiple views, with and without guidewire in place revealed excellent stent deployment and lesion coverage. There was no evidence of dissection or perforation. Brisk TIMI-3 flow is noted in the downstream vessel  MEDICATIONS:  Anesthesia: Local Lidocaine 2 ml  Sedation: 4 mg IV Versed, 100 mcg IV fentanyl ;   Omnipaque Contrast: 180  ml  Anticoagulation: IV Heparin 13500 Units total   Anti-Platelet Agent: Effient and aspirin as standing home medication  PATIENT DISPOSITION:   The patient was transferred to the PACU holding area in a hemodynamicaly stable, chest pain free condition.  The patient tolerated the procedure well, and there were no complications. EBL: < 5 ml  The patient was stable before, during, and after the procedure.  POST-OPERATIVE DIAGNOSIS:   Severe native CAD with 100% negative RCA, LAD & D1 along with OM 1 and OM 2 as previously described  Severe proximal edge in-stent restenosis of SVG-RPDA   Successful PCI of the proximal edge in-stent restenosis of SVG RCA with scoring balloon angioplasty followed by DES stent placement: Promus Premier 3.5 mm x 12 mm postdilated to 4.1 mm  Widely patent LIMA-diagonal with distal collaterals to what appears to be the apical LAD.  Known occlusion of the SVG-LAD and SVG-OM  Relatively preserved LVEF as noted on the cardiogram recently. There is known hypokinesis to akinesis of the nasal to mid inferior inferolateral wall as well as mild hypokinesis of the basal anterior wall assistant with known CAD.  PLAN OF CARE:  Overnight observation post-PCI. Continue home doses of aspirin plus Effient.  Standard post radial cath care with care being removal   Discharge in the morning if stable.  Followup with Dr. Harl Bowie - continue to optimize medical management of existing severe CAD.   05/2015 Cath       Dominance: Co-dominant          Left Anterior Descending   . Mid  LAD lesion, 100% stenosed.   . First Diagonal Branch   The vessel is small in size.   . 1st Diag lesion, 99% stenosed.       [place] Intermedius  The vessel is small .          Left Circumflex  The vessel is small .   Marland Kitchen Mid Cx to Dist Cx lesion, 100% stenosed.    . Second Obtuse Marginal Branch   The vessel is small in size.   . Third Obtuse Marginal Branch   3rd Mrg filled by collaterals from 1st Mrg.          Right Coronary Artery   . Prox RCA to Mid RCA lesion, 100% stenosed.   . Dist RCA lesion, 100% stenosed.          Graft Angiography         Free Graft to Dist LAD  SVG   . Prox Graft to Mid Graft lesion, 100% stenosed.         Free Graft to Dist RCA  SVG   . Mid Graft lesion, 30% stenosed. The lesion was previously treated with a stent (unknown type) .         Free Graft to 2nd Mrg  SVG   . Origin to Prox Graft lesion, 100% stenosed.     Free LIMA Graft to 2nd Diag  LIMA        Cath RECOMMENDATIONS:   Continued aggressive risk factor modification  No anatomy amenable to percutaneous intervention.     Assessment and Plan  1. CAD  - no current symptoms, we will continue current meds - notes very easy bruising, we will stop her ASA. Continue effient for secondary prevention given her long significant CAD history.  2. Carotid stenosis  - moderate by last [place] 08/2015, repeat later this year.   3. HTN  - at goal. Reports some orthostatic symptoms, we will decrease lbenazepril to 53m daily.   4. Hyperlipidemia - has not tolerated statins. We will repeat lipid panel, consider zetia peniding LDL.   5. Preop evaluation - from cardiac standpoint, she is stable to proceed with hand surgery   F/u 6 months. Repeat annual labs      JArnoldo Lenis M.D., F.A.C.C.

## 2016-11-06 ENCOUNTER — Other Ambulatory Visit: Payer: Self-pay | Admitting: Cardiology

## 2016-11-06 MED ORDER — AMLODIPINE BESYLATE 10 MG PO TABS: 10.0000 mg | ORAL_TABLET | Freq: Every day | ORAL | 1 refills | Status: DC

## 2016-11-06 MED ORDER — OMEPRAZOLE 20 MG PO CPDR: DELAYED_RELEASE_CAPSULE | ORAL | 1 refills | Status: DC

## 2016-11-06 NOTE — Telephone Encounter (Signed)
omeprazole (PRILOSEC) 20 MG capsule   amLODipine (NORVASC) 10 MG tablet   [facility] [place]

## 2016-11-06 NOTE — Telephone Encounter (Signed)
Medication sent to pharmacy  

## 2016-11-09 ENCOUNTER — Ambulatory Visit (INDEPENDENT_AMBULATORY_CARE_PROVIDER_SITE_OTHER): Payer: Medicaid Other

## 2016-11-09 DIAGNOSIS — R002 Palpitations: Secondary | ICD-10-CM

## 2016-11-10 ENCOUNTER — Other Ambulatory Visit: Payer: Self-pay | Admitting: Cardiology

## 2016-11-16 ENCOUNTER — Other Ambulatory Visit: Payer: Self-pay | Admitting: Cardiology

## 2016-12-03 ENCOUNTER — Other Ambulatory Visit: Payer: Self-pay | Admitting: Cardiovascular Disease

## 2016-12-08 ENCOUNTER — Other Ambulatory Visit: Payer: Self-pay | Admitting: Cardiovascular Disease

## 2016-12-23 ENCOUNTER — Encounter: Payer: Self-pay | Admitting: *Deleted

## 2016-12-24 ENCOUNTER — Ambulatory Visit (INDEPENDENT_AMBULATORY_CARE_PROVIDER_SITE_OTHER): Payer: Medicaid Other | Admitting: Cardiology

## 2016-12-24 ENCOUNTER — Encounter: Payer: Self-pay | Admitting: Cardiology

## 2016-12-24 VITALS — BP 117/71 | HR 51 | Ht 63.0 in | Wt 211.2 lb

## 2016-12-24 DIAGNOSIS — E782 Mixed hyperlipidemia: Secondary | ICD-10-CM

## 2016-12-24 DIAGNOSIS — I2510 Atherosclerotic heart disease of native coronary artery without angina pectoris: Secondary | ICD-10-CM | POA: Diagnosis not present

## 2016-12-24 DIAGNOSIS — I10 Essential (primary) hypertension: Secondary | ICD-10-CM | POA: Diagnosis not present

## 2016-12-24 DIAGNOSIS — R0789 Other chest pain: Secondary | ICD-10-CM | POA: Diagnosis not present

## 2016-12-24 MED ORDER — ISOSORBIDE MONONITRATE ER 30 MG PO TB24: 45.0000 mg | ORAL_TABLET | Freq: Every day | ORAL | 1 refills | Status: DC

## 2016-12-24 NOTE — Patient Instructions (Signed)
Your physician recommends that you schedule a follow-up appointment in: 2 MONTHS WITH DR. BRANCH  Your physician has recommended you make the following change in your medication: INCREASE IMDUR TO 45MG (1 1/2 TABLETS) DAILY

## 2016-12-24 NOTE — Progress Notes (Signed)
Clinical Summary [name] is a 61 y.o.female seen today for follow up of the following medical problems.    1. CAD  - CABG 2005 4 vessel  last cath 05/2015 results as reported below, no targets for pci, recs for medical management. Was started on ranexa 559m bid, had headache on nitrates. She attributes that episode to severe stress, had lost a very close friend at that time.    - pressure like pain midchest 2-3 weeks ago. 10/10 in severity. Occurred while walking around at home. +SOB, hot sweaty, nauseous.  - took NG, called EMS. Negative workup. Pain not better with NG. Pain lasted just a few minutes. Lots of overwhelming stress at that time - reports other episode in June. Recent increase in xanax per pcp due to severe ongoing anxiety.   - headaches on ranexa.     2. Carotid stenosis  - Carotid [place] 40-59% bilateral - denies any recent stroke like symptoms    3. HTN  - compliant meds    4. Hyperlipidemia  - did not tolerate crestor, prava, or lovastatin due to side effects.     Past Medical History:  Diagnosis Date  . Acute renal failure ([place]   . Anemia   . Anxiety   . Arthritis   . Asthma   . Carotid artery disease ([place]    a. Duplex 50-69% BICA 03/2014.  .Marland KitchenChronic back pain   . COPD (chronic obstructive pulmonary disease) ([place]   . Coronary artery disease    a. s/p prior CABG 2005. b. BMS 02/2009 to VG-PDA. c. NSTEMI 08/2010 s/p PTCA/DES to SVG-PDA in previously stented area. d. DES to SVG to RCA on 05/24/14  c. LHC 06/04/2015 w/ no sig change in her anatomy and Rx managment  . DDD (degenerative disc disease)   . Depression   . Diverticulitis    4 documented episodes by CT; most recent April 2012  . GERD (gastroesophageal reflux disease)   . History of blood transfusion   . HTN (hypertension)   . Hyperlipidemia   . Hypertension   . Hypothyroidism   . Ischemic cardiomyopathy    a. EF 40% in 2012 by cath. b. improved to 50-55% by cath  05/2014  [facility]  . Kidney stones   . Seizures ([place]   . Sinus bradycardia   . Sleep apnea    a. Mild-to-moderate obstructive sleep apnea diagnosed in April 2012 by sleep study.  . Tension headache      Allergies  Allergen Reactions  . Pneumococcal Vaccines Anxiety, Palpitations and Cough  . Adhesive [Tape] Itching and Other (See Comments)    Blisters (pls use paper tape)  . Crestor [Rosuvastatin] Other (See Comments)    Muscle pain  . Lipitor [Atorvastatin] Other (See Comments)    Cramps     Current Outpatient Prescriptions  Medication Sig Dispense Refill  . albuterol (PROAIR HFA) 108 (90 BASE) MCG/ACT inhaler Inhale 2 puffs into the lungs every 6 (six) hours as needed for wheezing or shortness of breath.    .Marland Kitchenalbuterol (PROVENTIL) (2.5 MG/3ML) 0.083% nebulizer solution Take 2.5 mg by nebulization every 6 (six) hours as needed for wheezing or shortness of breath.    . ALPRAZolam (XANAX) 1 MG tablet Take 1 mg by mouth 4 (four) times daily as needed for anxiety or sleep.     .Marland KitchenamLODipine (NORVASC) 10 MG tablet TAKE ONE TABLET BY MOUTH ONCE DAILY. 30 tablet  1  . aspirin 325 MG tablet Take 325 mg by mouth daily.    . benazepril (LOTENSIN) 10 MG tablet TAKE (1) TABLET BY MOUTH EACH MORNING. 90 tablet 1  . butalbital-acetaminophen-caffeine (FIORICET, ESGIC) 50-325-40 MG tablet Take 1 tablet by mouth every 4 (four) hours as needed for headache. 14 tablet 0  . EFFIENT 10 MG TABS tablet TAKE (1) TABLET BY MOUTH EACH MORNING. 30 tablet 3  . ezetimibe (ZETIA) 10 MG tablet TAKE ONE TABLET BY MOUTH ONCE DAILY. 15 tablet 0  . fluticasone (FLONASE) 50 MCG/ACT nasal spray Place 2 sprays into both nostrils daily as needed for allergies or rhinitis.    . furosemide (LASIX) 80 MG tablet Take 80 mg by mouth daily as needed for fluid.    Marland Kitchen HYDROcodone-acetaminophen (NORCO) 10-325 MG per tablet Take 1 tablet by mouth every 4 (four) hours as needed for moderate pain (pain).     .  isosorbide mononitrate (IMDUR) 30 MG 24 hr tablet Take 1 tablet (30 mg total) by mouth daily. 30 tablet 12  . levETIRAcetam (KEPPRA) 250 MG tablet Take 250 mg by mouth 2 (two) times daily.    Marland Kitchen levothyroxine (SYNTHROID, LEVOTHROID) 75 MCG tablet Take 75 mcg by mouth daily before breakfast.     . metoprolol succinate (TOPROL-XL) 50 MG 24 hr tablet TAKE (1) TABLET BY MOUTH EACH MORNING WITH OR IMMEDIATELY FOLLOWING AMEAL. 30 tablet 6  . Multiple Vitamin (MULTIVITAMIN WITH MINERALS) TABS tablet Take 1 tablet by mouth 2 (two) times daily. 25 tablet 4  . nitroGLYCERIN (NITROSTAT) 0.4 MG SL tablet DISSOLVE 1 TABLET UNDER TONGUE EVERY 5 MINUTES UP TO 15 MIN FOR CHESTPAIN. IF NO RELIEF CALL 911. 25 tablet 3  . omega-3 acid ethyl esters (LOVAZA) 1 G capsule Take 2 capsules (2 g total) by mouth 2 (two) times daily. 60 capsule 11  . omeprazole (PRILOSEC) 20 MG capsule TAKE (1) CAPSULE BY MOUTH ONCE DAILY. 30 capsule 0  . potassium chloride SA (K-DUR,KLOR-CON) 20 MEQ tablet TAKE (1) TABLET BY MOUTH EACH MORNING. 30 tablet 3   No current facility-administered medications for this visit.      Past Surgical History:  Procedure Laterality Date  . APPENDECTOMY  ~ 1970  . CARDIAC CATHETERIZATION  Jan 2012   patent stents in RCA graft; CAD stable  . CARDIAC CATHETERIZATION N/A 06/03/2015   Procedure: Left Heart Cath and Cors/Grafts Angiography;  Surgeon: Belva Crome, MD;  Location: [facility];  Service: Cardiovascular;  Laterality: N/A;  . CAROTID ENDARTERECTOMY  2005  . CARPAL TUNNEL WITH CUBITAL TUNNEL Right 2001  . CORONARY ANGIOPLASTY WITH STENT PLACEMENT  2011   Promus Premier DES, 3.5 mm x 12 mm, for 99% proximal stent ISR in the SVG-RPDA   . CORONARY ANGIOPLASTY WITH STENT PLACEMENT  05/24/2014  . CORONARY ARTERY BYPASS GRAFT  2005   LIMA to AL, SVG to LAD, SVG to Cx, SVG-PDA  . KNEE ARTHROSCOPY W/ ACL RECONSTRUCTION Left 2001  . LAPAROSCOPIC CHOLECYSTECTOMY  2010  . LEFT HEART  CATHETERIZATION WITH CORONARY/GRAFT ANGIOGRAM N/A 05/24/2014   Procedure: LEFT HEART CATHETERIZATION WITH Beatrix Fetters;  Surgeon: Leonie Man, MD; EF 50-55%, LAD & D1 100%, LIMA-D1 OK, CFX OK, OM1 & OM2 100%, lat OM Xeng Kucher 100%, RCA 100%; pSVG-RPDA 99% rx w/ scoring balloon PTCA & 3.5 x 12 mm Promus DES; SVG-LAD, SVG-OM known occluded      . TUBAL LIGATION  ~ 1989  . VAGINAL HYSTERECTOMY  ~ 1993  Allergies  Allergen Reactions  . Pneumococcal Vaccines Anxiety, Palpitations and Cough  . Adhesive [Tape] Itching and Other (See Comments)    Blisters (pls use paper tape)  . Crestor [Rosuvastatin] Other (See Comments)    Muscle pain  . Lipitor [Atorvastatin] Other (See Comments)    Cramps      Family History  Problem Relation Age of Onset  . Heart attack Mother   . Heart disease Mother   . Breast cancer Mother   . Heart attack Father   . Heart disease Father   . Colon cancer Neg Hx      Social History Ms. Braddock reports that she quit smoking about 16 months ago. Her smoking use included Cigarettes. She started smoking about 23 years ago. She has a 10.50 pack-year smoking history. She has never used smokeless tobacco. Ms. Kovatch reports that she does not drink alcohol.   Review of Systems CONSTITUTIONAL: No weight loss, fever, chills, weakness or fatigue.  HEENT: Eyes: No visual loss, blurred vision, double vision or yellow sclerae.No hearing loss, sneezing, congestion, runny nose or sore throat.  SKIN: No rash or itching.  CARDIOVASCULAR: per hpi RESPIRATORY: No shortness of breath, cough or sputum.  GASTROINTESTINAL: No anorexia, nausea, vomiting or diarrhea. No abdominal pain or blood.  GENITOURINARY: No burning on urination, no polyuria NEUROLOGICAL: No headache, dizziness, syncope, paralysis, ataxia, numbness or tingling in the extremities. No change in bowel or bladder control.  MUSCULOSKELETAL: No muscle, back pain, joint pain or stiffness.  LYMPHATICS: No  enlarged nodes. No history of splenectomy.  PSYCHIATRIC: No history of depression or anxiety.  ENDOCRINOLOGIC: No reports of sweating, cold or heat intolerance. No polyuria or polydipsia.  Marland Kitchen   Physical Examination Vitals:   12/24/16 1329  BP: 117/71  Pulse: (!) 51   Vitals:   12/24/16 1329  Weight: 211 lb 3.2 oz (95.8 kg)  Height: '5\' 3"'  (1.6 m)    Gen: resting comfortably, no acute distress HEENT: no scleral icterus, pupils equal round and reactive, no palptable cervical adenopathy,  CV: RRR, 2/6 systolic murmur RUSB,.  no jvd Resp: Clear to auscultation bilaterally GI: abdomen is soft, non-tender, non-distended, normal bowel sounds, no hepatosplenomegaly MSK: extremities are warm, no edema.  Skin: warm, no rash Neuro:  no focal deficits Psych: appropriate affect   Diagnostic Studies Cath 2012  Left main coronary was normal.  Left anterior descending artery was occluded just after the septal  perforator. First diagonal [name] was subtotally occluded. There was  some filling of the distal LAD and diagonals from collateral branches  from the LIMA. Circumflex coronary artery was occluded in the  midvessel. The AV groove [name] was patent. There was a small first  obtuse marginal [name] with 30-40% multiple lesions. There was a small  second obtuse marginal [name] with 30-40% discrete lesions. There is a  very large obtuse marginal [name] seen filling from collaterals from the  left internal mammary artery.  Right coronary artery was 100% occluded proximally with both left-to-  right and right-to-right collaterals. The distal PDA also filled from  the vein graft to the right coronary artery.  The left internal mammary artery was widely patent. It also filled the  distal right large obtuse marginal Al Bracewell and filled retrograde to a  smaller diagonal Bayli Quesinberry.  There was known occlusion of the 2 left-sided vein grafts. The  saphenous vein graft to the right  coronary artery was widely patent.  Bare-metal stent in the mid-to-distal vessel widely patent.  There was  poor runoff to the vessel primarily being retrograde into the posterior  lateral Destine Ambroise, however, there was no new focal stenosis.  RAO ventriculography. RAO ventriculography showed anterior apical and  mid inferior wall hypokinesis. EF was in the 40% range.  Right heart catheterization showed mean pulmonary capillary wedge  pressure of 24, PA pressure was 55/26, RV pressure was 52/5, RA pressure  mean was 16, aortic pressure was 155/76, LV pressure was equal to 167/16  with a post A-wave EDP of 26.  Cardiac output was 6.3 liters per minute with a cardiac index of 3  liters per minute per meter squared by Fick.   IMPRESSION: The patient's coronary artery disease is stable. The  stents in the RCA graft were patent. There are no new lesions that I  can see. She does have collateralized distal right and OM Sondra Blixt.  Continued medical therapy is warranted. If she continues to have chest  pain, Ranexa may be in order since it is somewhat late in the day and  she had both venous and arterial sheath. She will be kept overnight and  discharged in the morning. She tolerated the procedure well.   03/2014 Echo  Study Conclusions  - Left ventricle: The cavity size was normal. Wall thickness was increased in a pattern of mild LVH. Systolic function was normal. The estimated ejection fraction was in the range of 50% to 55%. There is akinesis of the basalinferolateral myocardium. Features are consistent with a pseudonormal left ventricular filling pattern, with concomitant abnormal relaxation and increased filling pressure (grade 2 diastolic dysfunction). - Aortic valve: Mildly calcified annulus. Trileaflet. There was no significant regurgitation. - Mitral valve: There was trivial regurgitation. - Left atrium: The atrium was mildly to moderately dilated. - Right ventricle:  Systolic function was mildly reduced. - Right atrium: Central venous pressure (est): 3 mm Hg. - Atrial septum: No defect or patent foramen ovale was identified. - Tricuspid valve: There was trivial regurgitation. - Pulmonary arteries: Systolic pressure could not be accurately estimated. - Pericardium, extracardiac: There was no pericardial effusion.  Impressions:  - Mild LVH with LVEF 50-55%, basal inferolateral hypokinesis, grade 2 diastolic dysfunction. Mild to moderate left atrial enlargement. Mildly reduced RV contraction. Unable to assess PASP.  03/2014 Carotid US  IMPRESSION:  Bilateral 50-69% stenosis. The left internal carotid peak systolic  velocity has increased slightly in the interval from the prior exam.  05/2014 Cath FINDINGS:  Hemodynamics:  1. Central Aortic Pressure / Mean: 110/62/81 mmHg 2. Left Ventricular Pressure / LVEDP: 110/14/21 mmHg  Left Ventriculography:  EF: 50 at 55 %  Wall Motion: Severe hypokinesis of the Basal to MID inferior-inferolateraL wall with mild hypokinesis of the basal anterior wall  Coronary Anatomy:  Dominance: Right  Left Main: Normal caliber vessel that itself is free of significant disease. Bifurcates into the LAD and Circumflex LAD: Moderate caliber vessel that is 100% occluded after a septal perforator and small diagonal [name]. There is a first diagonal [name] that is also half percent occluded after initial subtotal occlusion.  Left Circumflex: Moderate caliber vessel it gives rise to a proximal small caliber OM [name] that is somewhat tortuous and free of significant disease. The vessel then bifurcates into the introducer complex which is small in caliber and perfuses the basal inferolateral wall. There is a major lateral OM [name] the visualized the proximal segment is occluded. There is late retrograde filling of the remainder the nature OM which appears to have 2 branches distally.  This is faint collaterals from the  proximal OM.   RCA: 100% proximal occluded. SVG-RPDA: Large-caliber graft with mildly irregular is proximally, there are overlapping stents in the distal mid graft with 99% napkin ring proximal in-stent restenosis of the most proximal stent that extends just minimally to the unstented segment. The remainder of the graft is relatively free of disease and attached to the mid RPDA.  RPDA: Large-caliber tortuous vessel that reaches almost to the apex.  RPL Sysytem:The RPAV fills via retrograde flow in the RPDA. There are several small posterolateral branches that are relatively free of disease. The PAV is relatively free of disease.  After reviewing the initial angiography, the culprit lesion was thought to be the 99% proximal stent ISR in the SVG-RPDA. Preparation were made to proceed with PCI on this lesion. As the lesion is focal & appears to be fibrous in nature and too stenosed to safely pass a filter wire, the decision was made to pre-dilate & stent without distal protection. No evidence of a filling defect was noted besides the "napkin-ring" lesion.  Percutaneous Coronary Intervention: Sheath exchanged for 6 Fr Guide: 6 Fr AL1Guidewire: BMW (after failed attempt with Pro-Water Predilation Balloon: Angioscult Cutting Balloon 3.5 mm x 10 mm;  ? 12 Atm x 45 Sec,-- excellent predilation of the fibrous lesion at high pressure. Brisk TIMI-3 flow down stream but no sign of no reflow. The decision was made to proceed with stenting. Stent: Promus Premier DES 3.5 mm x 12 mm; overlaps roughly 4 mm into the original stent. ? Deployed at: 20 Atm x 30 Sec Post-dilation Balloon: [name] 4.0 mm x 8 mm;  ? 14 Atm x 30 Sec x 2 -  ? Final Diameter: 4.1 mm  Post deployment angiography in multiple views, with and without guidewire in place revealed excellent stent deployment and lesion coverage. There was no evidence of dissection or perforation. Brisk TIMI-3 flow is noted in the  downstream vessel  MEDICATIONS:  Anesthesia: Local Lidocaine 2 ml  Sedation: 4 mg IV Versed, 100 mcg IV fentanyl ;   Omnipaque Contrast: 180 ml  Anticoagulation: IV Heparin 13500 Units total   Anti-Platelet Agent: Effient and aspirin as standing home medication  PATIENT DISPOSITION:   The patient was transferred to the PACU holding area in a hemodynamicaly stable, chest pain free condition.  The patient tolerated the procedure well, and there were no complications. EBL: < 5 ml  The patient was stable before, during, and after the procedure.  POST-OPERATIVE DIAGNOSIS:   Severe native CAD with 100% negative RCA, LAD & D1 along with OM 1 and OM 2 as previously described  Severe proximal edge in-stent restenosis of SVG-RPDA   Successful PCI of the proximal edge in-stent restenosis of SVG RCA with scoring balloon angioplasty followed by DES stent placement: Promus Premier 3.5 mm x 12 mm postdilated to 4.1 mm  Widely patent LIMA-diagonal with distal collaterals to what appears to be the apical LAD.  Known occlusion of the SVG-LAD and SVG-OM  Relatively preserved LVEF as noted on the cardiogram recently. There is known hypokinesis to akinesis of the nasal to mid inferior inferolateral wall as well as mild hypokinesis of the basal anterior wall assistant with known CAD.  PLAN OF CARE:  Overnight observation post-PCI. Continue home doses of aspirin plus Effient.  Standard post radial cath care with care being removal   Discharge in the morning if stable.  Followup with Dr. Harl Bowie - continue to optimize medical management of  existing severe CAD.   05/2015 Cath       Dominance: Co-dominant          Left Anterior Descending   . Mid LAD lesion, 100% stenosed.   . First Diagonal Kashawna Manzer   The vessel is small in size.   . 1st Diag lesion, 99% stenosed.       Ramus Intermedius  The  vessel is small .          Left Circumflex  The vessel is small .   Marland Kitchen Mid Cx to Dist Cx lesion, 100% stenosed.   . Second Obtuse Marginal [name]   The vessel is small in size.   . Third Obtuse Marginal [name]   3rd Mrg filled by collaterals from 1st Mrg.          Right Coronary Artery   . Prox RCA to Mid RCA lesion, 100% stenosed.   . Dist RCA lesion, 100% stenosed.          Graft Angiography         Free Graft to Dist LAD  SVG   . Prox Graft to Mid Graft lesion, 100% stenosed.         Free Graft to Dist RCA  SVG   . Mid Graft lesion, 30% stenosed. The lesion was previously treated with a stent (unknown type) .         Free Graft to 2nd Mrg  SVG   . Origin to Prox Graft lesion, 100% stenosed.     Free LIMA Graft to 2nd Diag  LIMA        Cath RECOMMENDATIONS:   Continued aggressive risk factor modification  No anatomy amenable to percutaneous intervention.                               Assessment and Plan  1. CAD  -- recent symptoms associated with severe stress/anxiety, no exertional symptoms.  - we will increase imdur to 72m daily, follow symptoms.  - notes very easy bruising, we will stop her ASA. Continue effient for secondary prevention given her long significant CAD history.  2. Carotid stenosis  - moderate by last [place] - we will repeat later this year  3. HTN  - her bis at goal. Continue current meds  4. Hyperlipidemia - has not tolerated statins. - request labs form pcp - may consider zetia in the future   F/u 2 months      JArnoldo Lenis M.D., F.A.C.C.

## 2016-12-29 ENCOUNTER — Ambulatory Visit: Payer: Medicaid Other | Admitting: Orthopedic Surgery

## 2017-01-04 ENCOUNTER — Other Ambulatory Visit: Payer: Self-pay | Admitting: Cardiovascular Disease

## 2017-01-12 ENCOUNTER — Ambulatory Visit: Payer: Medicaid Other | Admitting: Orthopedic Surgery

## 2017-01-21 ENCOUNTER — Other Ambulatory Visit: Payer: Self-pay | Admitting: Cardiology

## 2017-02-01 ENCOUNTER — Other Ambulatory Visit: Payer: Self-pay | Admitting: Cardiology

## 2017-02-19 ENCOUNTER — Other Ambulatory Visit: Payer: Self-pay | Admitting: Cardiology

## 2017-03-02 ENCOUNTER — Other Ambulatory Visit: Payer: Self-pay | Admitting: Cardiovascular Disease

## 2017-03-08 ENCOUNTER — Encounter: Payer: Self-pay | Admitting: Cardiology

## 2017-03-08 ENCOUNTER — Ambulatory Visit: Payer: Self-pay | Admitting: Cardiology

## 2017-03-08 NOTE — Progress Notes (Deleted)
Clinical Summary [name] is a 61 y.o.female seen today for follow up of the following medical problems.    1. CAD  - CABG 2005 4 vessel  last cath 05/2015 results as reported below, no targets for pci, recs for medical management. Was started on ranexa 537m bid, had headache on nitrates. She attributes that episode to severe stress, had lost a very close friend at that time.   - pressure like pain midchest 2-3 weeks ago. 10/10 in severity. Occurred while walking around at home. +SOB, hot sweaty, nauseous.  - took NG, called EMS. Negative workup. Pain not better with NG. Pain lasted just a few minutes. Lots of overwhelming stress at that time - reports other episode in June. Recent increase in xanax per pcp due to severe ongoing anxiety.     - headaches on ranexa.  - last visit increased imdur to 462mdaily - last visit stopped asa due to heavy bruising.   2. Carotid stenosis  - Carotid [place] 40-59% bilateral - denies any recent stroke like symptoms    3. HTN  - compliant meds    4. Hyperlipidemia  - did not tolerate crestor, prava, or lovastatin due to side effects.    Past Medical History:  Diagnosis Date  . Acute renal failure ([facility]  . Anemia   . Anxiety   . Arthritis   . Asthma   . Carotid artery disease ([facility]   a. Duplex 50-69% BICA 03/2014.  . Marland Kitchenhronic back pain   . COPD (chronic obstructive pulmonary disease) ([facility]  . Coronary artery disease    a. s/p prior CABG 2005. b. BMS 02/2009 to VG-PDA. c. NSTEMI 08/2010 s/p PTCA/DES to SVG-PDA in previously stented area. d. DES to SVG to RCA on 05/24/14  c. LHC 06/04/2015 w/ no sig change in her anatomy and Rx managment  . DDD (degenerative disc disease)   . Depression   . Diverticulitis    4 documented episodes by CT; most recent April 2012  . GERD (gastroesophageal reflux disease)   . History of blood transfusion   . HTN (hypertension)   . Hyperlipidemia   . Hypertension   .  Hypothyroidism   . Ischemic cardiomyopathy    a. EF 40% in 2012 by cath. b. improved to 50-55% by cath 05/2014  [facility]  . Kidney stones   . Seizures ([facility]  . Sinus bradycardia   . Sleep apnea    a. Mild-to-moderate obstructive sleep apnea diagnosed in April 2012 by sleep study.  . Tension headache      Allergies  Allergen Reactions  . Pneumococcal Vaccines Anxiety, Palpitations and Cough  . Adhesive [Tape] Itching and Other (See Comments)    Blisters (pls use paper tape)  . Crestor [Rosuvastatin] Other (See Comments)    Muscle pain  . Lipitor [Atorvastatin] Other (See Comments)    Cramps     Current Outpatient Prescriptions  Medication Sig Dispense Refill  . albuterol (PROAIR HFA) 108 (90 BASE) MCG/ACT inhaler Inhale 2 puffs into the lungs every 6 (six) hours as needed for wheezing or shortness of breath.    . Marland Kitchenlbuterol (PROVENTIL) (2.5 MG/3ML) 0.083% nebulizer solution Take 2.5 mg by nebulization every 6 (six) hours as needed for wheezing or shortness of breath.    . ALPRAZolam (XANAX) 1 MG tablet Take 1 mg by mouth 4 (four) times daily as needed for anxiety or sleep.     .Marland Kitchen  amLODipine (NORVASC) 10 MG tablet TAKE ONE TABLET BY MOUTH ONCE DAILY. 30 tablet 0  . aspirin 325 MG tablet Take 325 mg by mouth daily.    . benazepril (LOTENSIN) 10 MG tablet TAKE (1) TABLET BY MOUTH EACH MORNING. 90 tablet 1  . butalbital-acetaminophen-caffeine (FIORICET, ESGIC) 50-325-40 MG tablet Take 1 tablet by mouth every 4 (four) hours as needed for headache. 14 tablet 0  . ezetimibe (ZETIA) 10 MG tablet TAKE ONE TABLET BY MOUTH ONCE DAILY. 15 tablet 0  . fluticasone (FLONASE) 50 MCG/ACT nasal spray Place 2 sprays into both nostrils daily as needed for allergies or rhinitis.    . furosemide (LASIX) 80 MG tablet TAKE ONE TABLET DAILY. 30 tablet 3  . HYDROcodone-acetaminophen (NORCO) 10-325 MG per tablet Take 1 tablet by mouth every 4 (four) hours as needed for moderate pain (pain).      . isosorbide mononitrate (IMDUR) 30 MG 24 hr tablet Take 1.5 tablets (45 mg total) by mouth daily. 135 tablet 1  . levETIRAcetam (KEPPRA) 250 MG tablet Take 250 mg by mouth 2 (two) times daily.    Marland Kitchen levothyroxine (SYNTHROID, LEVOTHROID) 75 MCG tablet Take 75 mcg by mouth daily before breakfast.     . metoprolol succinate (TOPROL-XL) 50 MG 24 hr tablet TAKE (1) TABLET BY MOUTH EACH MORNING WITH OR IMMEDIATELY FOLLOWING AMEAL. 30 tablet 0  . Multiple Vitamin (MULTIVITAMIN WITH MINERALS) TABS tablet Take 1 tablet by mouth 2 (two) times daily. 25 tablet 4  . nitroGLYCERIN (NITROSTAT) 0.4 MG SL tablet DISSOLVE 1 TABLET UNDER TONGUE EVERY 5 MINUTES UP TO 15 MIN FOR CHESTPAIN. IF NO RELIEF CALL 911. 25 tablet 3  . omega-3 acid ethyl esters (LOVAZA) 1 G capsule Take 2 capsules (2 g total) by mouth 2 (two) times daily. 60 capsule 11  . omeprazole (PRILOSEC) 20 MG capsule TAKE (1) CAPSULE BY MOUTH ONCE DAILY. 30 capsule 2  . potassium chloride SA (K-DUR,KLOR-CON) 20 MEQ tablet TAKE (1) TABLET BY MOUTH EACH MORNING. 30 tablet 3  . prasugrel (EFFIENT) 10 MG TABS tablet TAKE (1) TABLET BY MOUTH EACH MORNING. 30 tablet 0   No current facility-administered medications for this visit.      Past Surgical History:  Procedure Laterality Date  . APPENDECTOMY  ~ 1970  . CARDIAC CATHETERIZATION  Jan 2012   patent stents in RCA graft; CAD stable  . CARDIAC CATHETERIZATION N/A 06/03/2015   Procedure: Left Heart Cath and Cors/Grafts Angiography;  Surgeon: Belva Crome, MD;  Location: [facility];  Service: Cardiovascular;  Laterality: N/A;  . CAROTID ENDARTERECTOMY  2005  . CARPAL TUNNEL WITH CUBITAL TUNNEL Right 2001  . CORONARY ANGIOPLASTY WITH STENT PLACEMENT  2011   Promus Premier DES, 3.5 mm x 12 mm, for 99% proximal stent ISR in the SVG-RPDA   . CORONARY ANGIOPLASTY WITH STENT PLACEMENT  05/24/2014  . CORONARY ARTERY BYPASS GRAFT  2005   LIMA to AL, SVG to LAD, SVG to Cx, SVG-PDA  . KNEE  ARTHROSCOPY W/ ACL RECONSTRUCTION Left 2001  . LAPAROSCOPIC CHOLECYSTECTOMY  2010  . LEFT HEART CATHETERIZATION WITH CORONARY/GRAFT ANGIOGRAM N/A 05/24/2014   Procedure: LEFT HEART CATHETERIZATION WITH Beatrix Fetters;  Surgeon: Leonie Man, MD; EF 50-55%, LAD & D1 100%, LIMA-D1 OK, CFX OK, OM1 & OM2 100%, lat OM branch 100%, RCA 100%; pSVG-RPDA 99% rx w/ scoring balloon PTCA & 3.5 x 12 mm Promus DES; SVG-LAD, SVG-OM known occluded      . TUBAL LIGATION  ~  [place]  ~ 1993     Allergies  Allergen Reactions  . Pneumococcal Vaccines Anxiety, Palpitations and Cough  . Adhesive [Tape] Itching and Other (See Comments)    Blisters (pls use paper tape)  . Crestor [Rosuvastatin] Other (See Comments)    Muscle pain  . Lipitor [Atorvastatin] Other (See Comments)    Cramps      Family History  Problem Relation Age of Onset  . Heart attack Mother   . Heart disease Mother   . Breast cancer Mother   . Heart attack Father   . Heart disease Father   . Colon cancer Neg Hx      Social History [name] reports that she quit smoking about 19 months ago. Her smoking use included Cigarettes. She started smoking about 23 years ago. She has a 10.50 pack-year smoking history. She has never used smokeless tobacco. [name] reports that she does not drink alcohol.   Review of Systems CONSTITUTIONAL: No weight loss, fever, chills, weakness or fatigue.  HEENT: Eyes: No visual loss, blurred vision, double vision or yellow sclerae.No hearing loss, sneezing, congestion, runny nose or sore throat.  SKIN: No rash or itching.  CARDIOVASCULAR:  RESPIRATORY: No shortness of breath, cough or sputum.  GASTROINTESTINAL: No anorexia, nausea, vomiting or diarrhea. No abdominal pain or blood.  GENITOURINARY: No burning on urination, no polyuria NEUROLOGICAL: No headache, dizziness, syncope, paralysis, ataxia, numbness or tingling in the extremities. No change in bowel or bladder  control.  MUSCULOSKELETAL: No muscle, back pain, joint pain or stiffness.  LYMPHATICS: No enlarged nodes. No history of splenectomy.  PSYCHIATRIC: No history of depression or anxiety.  ENDOCRINOLOGIC: No reports of sweating, cold or heat intolerance. No polyuria or polydipsia.  Marland Kitchen   Physical Examination There were no vitals filed for this visit. There were no vitals filed for this visit.  Gen: resting comfortably, no acute distress HEENT: no scleral icterus, pupils equal round and reactive, no palptable cervical adenopathy,  CV Resp: Clear to auscultation bilaterally GI: abdomen is soft, non-tender, non-distended, normal bowel sounds, no hepatosplenomegaly MSK: extremities are warm, no edema.  Skin: warm, no rash Neuro:  no focal deficits Psych: appropriate affect   Diagnostic Studies Cath 2012 Left main coronary was normal.  Left anterior descending artery was occluded just after the septal  perforator. First diagonal branch was subtotally occluded. There was  some filling of the distal LAD and diagonals from collateral branches  from the LIMA. Circumflex coronary artery was occluded in the  midvessel. The AV groove branch was patent. There was a small first  obtuse marginal branch with 30-40% multiple lesions. There was a small  second obtuse marginal branch with 30-40% discrete lesions. There is a  very large obtuse marginal branch seen filling from collaterals from the  left internal mammary artery.  Right coronary artery was 100% occluded proximally with both left-to-  right and right-to-right collaterals. The distal PDA also filled from  the vein graft to the right coronary artery.  The left internal mammary artery was widely patent. It also filled the  distal right large obtuse marginal branch and filled retrograde to a  smaller diagonal branch.  There was known occlusion of the 2 left-sided vein grafts. The  saphenous vein graft to the right  coronary artery was widely patent.  Bare-metal stent in the mid-to-distal vessel widely patent. There was  poor runoff to the vessel primarily being retrograde into the posterior  lateral branch,  however, there was no new focal stenosis.  RAO ventriculography. RAO ventriculography showed anterior apical and  mid inferior wall hypokinesis. EF was in the 40% range.  Right heart catheterization showed mean pulmonary capillary wedge  pressure of 24, PA pressure was 55/26, RV pressure was 52/5, RA pressure  mean was 16, aortic pressure was 155/76, LV pressure was equal to 167/16  with a post A-wave EDP of 26.  Cardiac output was 6.3 liters per minute with a cardiac index of 3  liters per minute per meter squared by Fick.   IMPRESSION: The patient's coronary artery disease is stable. The  stents in the RCA graft were patent. There are no new lesions that I  can see. She does have collateralized distal right and OM branch.  Continued medical therapy is warranted. If she continues to have chest  pain, Ranexa may be in order since it is somewhat late in the day and  she had both venous and arterial sheath. She will be kept overnight and  discharged in the morning. She tolerated the procedure well.   03/2014 Echo Study Conclusions  - Left ventricle: The cavity size was normal. Wall thickness was increased in a pattern of mild LVH. Systolic function was normal. The estimated ejection fraction was in the range of 50% to 55%. There is akinesis of the basalinferolateral myocardium. Features are consistent with a pseudonormal left ventricular filling pattern, with concomitant abnormal relaxation and increased filling pressure (grade 2 diastolic dysfunction). - Aortic valve: Mildly calcified annulus. Trileaflet. There was no significant regurgitation. - Mitral valve: There was trivial regurgitation. - Left atrium: The atrium was mildly to moderately dilated. - Right ventricle:  Systolic function was mildly reduced. - Right atrium: Central venous pressure (est): 3 mm Hg. - Atrial septum: No defect or patent foramen ovale was identified. - Tricuspid valve: There was trivial regurgitation. - Pulmonary arteries: Systolic pressure could not be accurately estimated. - Pericardium, extracardiac: There was no pericardial effusion.  Impressions:  - Mild LVH with LVEF 50-55%, basal inferolateral hypokinesis, grade 2 diastolic dysfunction. Mild to moderate left atrial enlargement. Mildly reduced RV contraction. Unable to assess PASP.  03/2014 Carotid US IMPRESSION:  Bilateral 50-69% stenosis. The left internal carotid peak systolic  velocity has increased slightly in the interval from the prior exam.  05/2014 Cath FINDINGS: Hemodynamics: 1. Central Aortic Pressure / Mean: 110/62/81 mmHg 2. Left Ventricular Pressure / LVEDP: 110/14/21 mmHg  Left Ventriculography:  EF:50 at 55 %  Wall Motion:Severe hypokinesis of the Basal to MID inferior-inferolateraL wall with mild hypokinesis of the basal anterior wall  Coronary Anatomy:  Dominance: Right  Left Main: Normal caliber vessel that itself is free of significant disease. Bifurcates into the LAD and Circumflex XNT:ZGYFVCBS caliber vessel that is 100% occluded after a septal perforator and small diagonal branch. There is a first diagonal branch that is also half percent occluded after initial subtotal occlusion.  Left Circumflex:Moderate caliber vessel it gives rise to a proximal small caliber OM branch that is somewhat tortuous and free of significant disease. The vessel then bifurcates into the introducer complex which is small in caliber and perfuses the basal inferolateral wall. There is a major lateral OM branch the visualized the proximal segment is occluded. There is late retrograde filling of the remainder the nature OM which appears to have 2 branches distally. This is faint collaterals from the  proximal OM.   RCA: 100% proximal occluded. SVG-RPDA: Large-caliber graft with mildly irregular is proximally, there are  overlapping stents in the distal mid graft with 99% napkin ring proximal in-stent restenosis of the most proximal stent that extends just minimally to the unstented segment. The remainder of the graft is relatively free of disease and attached to the mid RPDA.  RPDA: Large-caliber tortuous vessel that reaches almost to the apex.  RPL Sysytem:The RPAV fills via retrograde flow in the RPDA. There are several small posterolateral branches that are relatively free of disease. The PAV is relatively free of disease.  After reviewing the initial angiography, the culprit lesion was thought to be the 99% proximal stent ISR in the SVG-RPDA. Preparation were made to proceed with PCI on this lesion. As the lesion is focal &appears to be fibrous in nature and too stenosed to safely pass a filter wire, the decision was made to pre-dilate &stent without distal protection. No evidence of a filling defect was noted besides the "napkin-ring" lesion.  Percutaneous Coronary Intervention: Sheath exchanged for 6 Fr Guide: 6 Fr AL1Guidewire: BMW (after failed attempt with Pro-Water Predilation Balloon: Angioscult Cutting Balloon 3.5 mm x 10 mm;  ? 12 Atm x 45 Sec,-- excellent predilation of the fibrous lesion at high pressure. Brisk TIMI-3 flow down stream but no sign of no reflow. The decision was made to proceed with stenting. Stent: Promus Premier DES 3.5 mm x 12 mm; overlaps roughly 4 mm into the original stent. ? Deployed at: 20 Atm x 30 Sec Post-dilation Balloon: [place] Emerge 4.0 mm x 8 mm;  ? 14 Atm x 30 Sec x 2 -  ? Final Diameter: 4.1 mm  Post deployment angiography in multiple views, with and without guidewire in place revealed excellent stent deployment and lesion coverage. There was no evidence of dissection or perforation. Brisk TIMI-3 flow is noted in the  downstream vessel  MEDICATIONS:  Anesthesia: Local Lidocaine 2 ml  Sedation: 4 mg IV Versed, 100 mcg IV fentanyl ;   Omnipaque Contrast: 180 ml  Anticoagulation: IV Heparin 13500 Units total   Anti-Platelet Agent: Effient and aspirin as standing home medication  PATIENT DISPOSITION:   The patient was transferred to the PACU holding area in a hemodynamicaly stable, chest pain free condition.  The patient tolerated the procedure well, and there were no complications. EBL: <5 ml  The patient was stable before, during, and after the procedure.  POST-OPERATIVE DIAGNOSIS:   Severe native CAD with 100% negative RCA, LAD & D1 along with OM 1 and OM 2 as previously described  Severe proximal edge in-stent restenosis of SVG-RPDA   Successful PCI of the proximal edge in-stent restenosis of SVG RCA with scoring balloon angioplasty followed by DES stent placement: Promus Premier 3.5 mm x 12 mm postdilated to 4.1 mm  Widely patent LIMA-diagonal with distal collaterals to what appears to be the apical LAD.  Known occlusion of the SVG-LAD and SVG-OM  Relatively preserved LVEF as noted on the cardiogram recently. There is known hypokinesis to akinesis of the nasal to mid inferior inferolateral wall as well as mild hypokinesis of the basal anterior wall assistant with known CAD.  PLAN OF CARE:  Overnight observation post-PCI. Continue home doses of aspirin plus Effient.  Standard post radial cath care with care being removal   Discharge in the morning if stable.  Followup with Dr. Harl Bowie - continue to optimize medical management of existing severe CAD.   05/2015 Cath       Dominance: Co-dominant          Left Anterior Descending   .  Mid LAD lesion, 100% stenosed.   . First Diagonal Branch   The vessel is small in size.   . 1st Diag lesion, 99% stenosed.       Ramus  Intermedius  The vessel is small .          Left Circumflex  The vessel is small .   Marland Kitchen Mid Cx to Dist Cx lesion, 100% stenosed.   . Second Obtuse Marginal Branch   The vessel is small in size.   . Third Obtuse Marginal Branch   3rd Mrg filled by collaterals from 1st Mrg.          Right Coronary Artery   . Prox RCA to Mid RCA lesion, 100% stenosed.   . Dist RCA lesion, 100% stenosed.          Graft Angiography        Free Graft to Dist LAD  SVG   . Prox Graft to Mid Graft lesion, 100% stenosed.         Free Graft to Dist RCA  SVG   . Mid Graft lesion, 30% stenosed. The lesion was previously treated with a stent (unknown type) .         Free Graft to 2nd Mrg  SVG   . Origin to Prox Graft lesion, 100% stenosed.     Free LIMA Graft to 2nd Diag  LIMA        Cath RECOMMENDATIONS:   Continued aggressive risk factor modification  No anatomy amenable to percutaneous intervention.       Assessment and Plan   1. CAD  -- recent symptoms associated with severe stress/anxiety, no exertional symptoms.  - we will increase imdur to 34m daily, follow symptoms.  - notes very easy bruising, we will stop her ASA. Continue effient for secondary prevention given her long significant CAD history.  2. Carotid stenosis  - moderate by last [place] - we will repeat later this year  3. HTN  - her bis at goal. Continue current meds  4. Hyperlipidemia - has not tolerated statins. - request labs form pcp - may consider zetia in the future   F/u 2 months     JArnoldo Lenis M.D., F.A.C.C.

## 2017-04-01 ENCOUNTER — Other Ambulatory Visit: Payer: Self-pay | Admitting: Cardiology

## 2017-04-09 ENCOUNTER — Ambulatory Visit: Payer: Self-pay | Admitting: Cardiology

## 2017-04-28 ENCOUNTER — Other Ambulatory Visit: Payer: Self-pay | Admitting: Cardiology

## 2017-05-24 ENCOUNTER — Encounter (HOSPITAL_COMMUNITY): Payer: Self-pay | Admitting: Emergency Medicine

## 2017-05-24 ENCOUNTER — Emergency Department (HOSPITAL_COMMUNITY): Payer: Medicaid Other

## 2017-05-24 ENCOUNTER — Emergency Department (HOSPITAL_COMMUNITY)
Admission: EM | Admit: 2017-05-24 | Discharge: 2017-05-24 | Disposition: A | Discharge status: Home / Self Care | Payer: Medicaid Other | Attending: Emergency Medicine | Admitting: Emergency Medicine

## 2017-05-24 DIAGNOSIS — R519 Headache, unspecified: Secondary | ICD-10-CM

## 2017-05-24 DIAGNOSIS — J45909 Unspecified asthma, uncomplicated: Secondary | ICD-10-CM | POA: Insufficient documentation

## 2017-05-24 DIAGNOSIS — I119 Hypertensive heart disease without heart failure: Secondary | ICD-10-CM | POA: Diagnosis not present

## 2017-05-24 DIAGNOSIS — E039 Hypothyroidism, unspecified: Secondary | ICD-10-CM | POA: Diagnosis not present

## 2017-05-24 DIAGNOSIS — Z87891 Personal history of nicotine dependence: Secondary | ICD-10-CM | POA: Diagnosis not present

## 2017-05-24 DIAGNOSIS — R748 Abnormal levels of other serum enzymes: Secondary | ICD-10-CM | POA: Diagnosis not present

## 2017-05-24 DIAGNOSIS — R197 Diarrhea, unspecified: Secondary | ICD-10-CM | POA: Diagnosis not present

## 2017-05-24 DIAGNOSIS — Z79899 Other long term (current) drug therapy: Secondary | ICD-10-CM | POA: Insufficient documentation

## 2017-05-24 DIAGNOSIS — R0981 Nasal congestion: Secondary | ICD-10-CM | POA: Diagnosis not present

## 2017-05-24 DIAGNOSIS — R112 Nausea with vomiting, unspecified: Secondary | ICD-10-CM

## 2017-05-24 DIAGNOSIS — R1110 Vomiting, unspecified: Secondary | ICD-10-CM | POA: Diagnosis present

## 2017-05-24 DIAGNOSIS — E876 Hypokalemia: Secondary | ICD-10-CM | POA: Insufficient documentation

## 2017-05-24 DIAGNOSIS — Z955 Presence of coronary angioplasty implant and graft: Secondary | ICD-10-CM | POA: Insufficient documentation

## 2017-05-24 DIAGNOSIS — I25810 Atherosclerosis of coronary artery bypass graft(s) without angina pectoris: Secondary | ICD-10-CM | POA: Insufficient documentation

## 2017-05-24 DIAGNOSIS — J449 Chronic obstructive pulmonary disease, unspecified: Secondary | ICD-10-CM | POA: Insufficient documentation

## 2017-05-24 DIAGNOSIS — R51 Headache: Secondary | ICD-10-CM | POA: Diagnosis not present

## 2017-05-24 LAB — TROPONIN I
Troponin I: 0.03 ng/mL (ref <0.03)
Troponin I: 0.03 ng/mL (ref <0.03)

## 2017-05-24 LAB — COMPREHENSIVE METABOLIC PANEL
ALK PHOS: 117 U/L (ref 38–126)
ALT: 113 U/L — ABNORMAL HIGH (ref 14–54)
ANION GAP: 10 (ref 5–15)
AST: 45 U/L — ABNORMAL HIGH (ref 15–41)
Albumin: 4.2 g/dL (ref 3.5–5.0)
BUN: 9 mg/dL (ref 6–20)
CALCIUM: 9.1 mg/dL (ref 8.9–10.3)
CO2: 23 mmol/L (ref 22–32)
Chloride: 102 mmol/L (ref 101–111)
Creatinine, Ser: 0.74 mg/dL (ref 0.44–1.00)
GFR calc non Af Amer: >60 mL/min (ref >60)
Glucose, Bld: 117 mg/dL — ABNORMAL HIGH (ref 65–99)
Potassium: 3.1 mmol/L — ABNORMAL LOW (ref 3.5–5.1)
SODIUM: 135 mmol/L (ref 135–145)
TOTAL PROTEIN: 7.6 g/dL (ref 6.5–8.1)
Total Bilirubin: 1 mg/dL (ref 0.3–1.2)

## 2017-05-24 LAB — ACETAMINOPHEN LEVEL

## 2017-05-24 LAB — CBC WITH DIFFERENTIAL/PLATELET
Basophils Absolute: 0 10*3/uL (ref 0.0–0.1)
Basophils Relative: 0 %
EOS ABS: 0 10*3/uL (ref 0.0–0.7)
EOS PCT: 0 %
HCT: 31.2 % — ABNORMAL LOW (ref 36.0–46.0)
HEMOGLOBIN: 10.7 g/dL — AB (ref 12.0–15.0)
LYMPHS ABS: 1.2 10*3/uL (ref 0.7–4.0)
Lymphocytes Relative: 13 %
MCH: 31.9 pg (ref 26.0–34.0)
MCHC: 34.3 g/dL (ref 30.0–36.0)
MCV: 93.1 fL (ref 78.0–100.0)
MONO ABS: 0.6 10*3/uL (ref 0.1–1.0)
MONOS PCT: 7 %
NEUTROS PCT: 80 %
Neutro Abs: 7.4 10*3/uL (ref 1.7–7.7)
Platelets: 228 10*3/uL (ref 150–400)
RBC: 3.35 MIL/uL — ABNORMAL LOW (ref 3.87–5.11)
RDW: 13.4 % (ref 11.5–15.5)
WBC: 9.3 10*3/uL (ref 4.0–10.5)

## 2017-05-24 LAB — LIPASE, BLOOD: LIPASE: 22 U/L (ref 11–51)

## 2017-05-24 MED ORDER — POTASSIUM CHLORIDE CRYS ER 20 MEQ PO TBCR
40.0000 meq | EXTENDED_RELEASE_TABLET | Freq: Once | ORAL | Status: AC
Start: 2017-05-24 — End: 2017-05-24
  Administered 2017-05-24: 40 meq via ORAL
  Filled 2017-05-24: qty 2

## 2017-05-24 MED ORDER — METHYLPREDNISOLONE SODIUM SUCC 125 MG IJ SOLR
INTRAMUSCULAR | Status: AC
  Administered 2017-05-24: 125 mg via INTRAVENOUS
  Filled 2017-05-24: qty 2

## 2017-05-24 MED ORDER — LORATADINE 10 MG PO TABS: 10.0000 mg | ORAL_TABLET | Freq: Every day | ORAL | 0 refills | Status: DC | PRN

## 2017-05-24 MED ORDER — METOCLOPRAMIDE HCL 10 MG PO TABS: 10.0000 mg | ORAL_TABLET | Freq: Four times a day (QID) | ORAL | 0 refills | Status: DC

## 2017-05-24 MED ORDER — HYDROMORPHONE HCL 1 MG/ML IJ SOLN
0.5000 mg | Freq: Once | INTRAMUSCULAR | Status: AC
  Administered 2017-05-24: 0.5 mg via INTRAVENOUS
  Filled 2017-05-24: qty 1

## 2017-05-24 MED ORDER — IOPAMIDOL (ISOVUE-300) INJECTION 61%
100.0000 mL | Freq: Once | INTRAVENOUS | Status: AC | PRN
  Administered 2017-05-24: 100 mL via INTRAVENOUS

## 2017-05-24 MED ORDER — DIPHENHYDRAMINE HCL 50 MG/ML IJ SOLN
25.0000 mg | Freq: Once | INTRAMUSCULAR | Status: AC
  Administered 2017-05-24: 25 mg via INTRAVENOUS
  Filled 2017-05-24: qty 1

## 2017-05-24 MED ORDER — METOCLOPRAMIDE HCL 5 MG/ML IJ SOLN
10.0000 mg | Freq: Once | INTRAMUSCULAR | Status: AC
  Administered 2017-05-24: 10 mg via INTRAVENOUS
  Filled 2017-05-24: qty 2

## 2017-05-24 MED ORDER — SODIUM CHLORIDE 0.9 % IV BOLUS (SEPSIS)
1000.0000 mL | Freq: Once | INTRAVENOUS | Status: AC
  Administered 2017-05-24: 1000 mL via INTRAVENOUS

## 2017-05-24 MED ORDER — METHYLPREDNISOLONE SODIUM SUCC 125 MG IJ SOLR
125.0000 mg | Freq: Once | INTRAMUSCULAR | Status: AC
  Administered 2017-05-24: 125 mg via INTRAVENOUS
  Filled 2017-05-24: qty 2

## 2017-05-24 MED ORDER — POTASSIUM CHLORIDE 10 MEQ/100ML IV SOLN
10.0000 meq | INTRAVENOUS | Status: AC
  Administered 2017-05-24 (×2): 10 meq via INTRAVENOUS
  Filled 2017-05-24 (×2): qty 100

## 2017-05-24 NOTE — ED Provider Notes (Signed)
2nd trop unchanged.  Stable for dc   Linwood Dibbles, MD 05/24/17 [id number]

## 2017-05-24 NOTE — ED Notes (Signed)
Pt to CT

## 2017-05-24 NOTE — ED Notes (Signed)
MD at bedside. 

## 2017-05-24 NOTE — ED Notes (Signed)
CRITICAL VALUE ALERT  Critical Value:  Troponin 0.03  Date & Time Notied:  05/24/17, 2017 hrs  Provider Notified: Dr. Ranae Palms

## 2017-05-24 NOTE — ED Triage Notes (Signed)
Pt reports n/v/d for the past 2 days with abd cramping.

## 2017-05-24 NOTE — ED Provider Notes (Signed)
[facility]EM[facility]Y DEPT Provider Note   CSN: [id number] Arrival date & time: 05/24/17  1830     History   Chief Complaint Chief Complaint  Patient presents with  . Emesis  . Diarrhea    HPI [name] is a 61 y.o. female.  HPI Patient states she developed bilateral temporal headache on Saturday. He describes it as gradual onset throbbing in nature. Associated with photophobia and nausea. States she's had similar headaches like this in the past. She developed multiple episodes of vomiting and diarrhea. Denies any blood in the stool or vomit. Complains of upper abdominal pain. Denied chest pain or shortness of breath. No fever or chills. Denies neck pain or stiffness. No focal weakness or numbness. Past Medical History:  Diagnosis Date  . Acute renal failure (HCC)   . Anemia   . Anxiety   . Arthritis   . Asthma   . Carotid artery disease (HCC)    a. Duplex 50-69% BICA 03/2014.  Marland Kitchen Chronic back pain   . COPD (chronic obstructive pulmonary disease) (HCC)   . Coronary artery disease    a. s/p prior CABG 2005. b. BMS 02/2009 to VG-PDA. c. NSTEMI 08/2010 s/p PTCA/DES to SVG-PDA in previously stented area. d. DES to SVG to RCA on 05/24/14  c. LHC 06/04/2015 w/ no sig change in her anatomy and Rx managment  . DDD (degenerative disc disease)   . Depression   . Diverticulitis    4 documented episodes by CT; most recent April 2012  . GERD (gastroesophageal reflux disease)   . History of blood transfusion   . HTN (hypertension)   . Hyperlipidemia   . Hypertension   . Hypothyroidism   . Ischemic cardiomyopathy    a. EF 40% in 2012 by cath. b. improved to 50-55% by cath 05/2014  c. EF 35-40% by [facility] 05/2015  . Kidney stones   . Seizures (HCC)   . Sinus bradycardia   . Sleep apnea    a. Mild-to-moderate obstructive sleep apnea diagnosed in April 2012 by sleep study.  . Tension headache     Patient Active Problem List   Diagnosis Date Noted  . Convulsions (HCC) 04/15/2016  . Angina  pectoris (HCC) 04/13/2016  . Chest pain 04/13/2016  . Erroneous encounter - disregard 09/17/2015  . Hypothyroidism   . Ischemic cardiomyopathy   . CAD S/P multiple PCI after CABG 06/04/2015  . Obesity-BMI 41 06/04/2015  . Unstable angina (HCC) 06/03/2015  . 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 LLQ abdominal pain 12/02/2011  . Epigastric pain 04/19/2011  . Anemia 04/19/2011  . Diverticulitis 01/15/2011  . DIVERTICULITIS OF COLON 07/14/2010  . DIARRHEA, CHRONIC 07/14/2010  . Hx of CABG 2005 01/23/2010  . HEADACHE 01/23/2010  . Hyperlipidemia with target LDL less than 70 05/27/2009  . Essential hypertension 05/27/2009  . PVD- 50-69% bilat ICA  05/27/2009  . Chronic obstructive pulmonary disease (COPD) (HCC) 05/27/2009  . GERD 05/27/2009  . SEIZURE DISORDER 05/27/2009    Past Surgical History:  Procedure Laterality Date  . APPENDECTOMY  ~ 1970  . CARDIAC CATHETERIZATION  Jan 2012   patent stents in RCA graft; CAD stable  . CARDIAC CATHETERIZATION N/A 06/03/2015   Procedure: Left Heart Cath and Cors/Grafts Angiography;  Surgeon: Lyn Records, MD;  Location: [facility] INVASIVE CV LAB;  Service: Cardiovascular;  Laterality: N/A;  . CAROTID ENDARTERECTOMY  2005  . CARPAL TUNNEL WITH CUBITAL TUNNEL Right 2001  . CORONARY ANGIOPLASTY  WITH STENT PLACEMENT  2011   Promus Premier DES, 3.5 mm x 12 mm, for 99% proximal stent ISR in the SVG-RPDA   . CORONARY ANGIOPLASTY WITH STENT PLACEMENT  05/24/2014  . CORONARY ARTERY BYPASS GRAFT  2005   LIMA to AL, SVG to LAD, SVG to Cx, SVG-PDA  . KNEE ARTHROSCOPY W/ ACL RECONSTRUCTION Left 2001  . LAPAROSCOPIC CHOLECYSTECTOMY  2010  . LEFT HEART CATHETERIZATION WITH CORONARY/GRAFT ANGIOGRAM N/A 05/24/2014   Procedure: LEFT HEART CATHETERIZATION WITH Isabel Caprice;  Surgeon: Marykay Lex, MD; EF 50-55%, LAD & D1 100%, LIMA-D1 OK, CFX OK, OM1 & OM2 100%, lat OM branch 100%, RCA 100%; pSVG-RPDA 99% rx w/ scoring  balloon PTCA & 3.5 x 12 mm Promus DES; SVG-LAD, SVG-OM known occluded      . TUBAL LIGATION  ~ 1989  . VAGINAL HYSTERECTOMY  ~ 1993    OB History    No data available       Home Medications    Prior to Admission medications   Medication Sig Start Date End Date Taking? Authorizing Provider  albuterol (PROAIR HFA) 108 (90 BASE) MCG/ACT inhaler Inhale 2 puffs into the lungs every 6 (six) hours as needed for wheezing or shortness of breath.    [provider]  albuterol (PROVENTIL) (2.5 MG/3ML) 0.083% nebulizer solution Take 2.5 mg by nebulization every 6 (six) hours as needed for wheezing or shortness of breath.    [provider]  ALPRAZolam Prudy Feeler) 1 MG tablet Take 1 mg by mouth 4 (four) times daily as needed for anxiety or sleep.     [provider]  amLODipine (NORVASC) 10 MG tablet TAKE ONE TABLET BY MOUTH ONCE DAILY. 04/01/17   Antoine Poche, MD  aspirin 325 MG tablet Take 325 mg by mouth daily.    [provider]  benazepril (LOTENSIN) 10 MG tablet TAKE (1) TABLET BY MOUTH EACH MORNING. 11/10/16   Antoine Poche, MD  butalbital-acetaminophen-caffeine (FIORICET, ESGIC) [phone] MG tablet Take 1 tablet by mouth every 4 (four) hours as needed for headache. 04/15/16   Kari Baars, MD  ezetimibe (ZETIA) 10 MG tablet TAKE ONE TABLET BY MOUTH ONCE DAILY. 10/29/16   Antoine Poche, MD  fluticasone (FLONASE) 50 MCG/ACT nasal spray Place 2 sprays into both nostrils daily as needed for allergies or rhinitis.    [provider]  furosemide (LASIX) 80 MG tablet TAKE ONE TABLET DAILY. 01/21/17   Antoine Poche, MD  HYDROcodone-acetaminophen (NORCO) 10-325 MG per tablet Take 1 tablet by mouth every 4 (four) hours as needed for moderate pain (pain).     [provider]  isosorbide mononitrate (IMDUR) 30 MG 24 hr tablet Take 1.5 tablets (45 mg total) by mouth daily. 12/24/16 03/24/17  Antoine Poche, MD  levETIRAcetam (KEPPRA) 250  MG tablet Take 250 mg by mouth 2 (two) times daily.    [provider]  levothyroxine (SYNTHROID, LEVOTHROID) 75 MCG tablet Take 75 mcg by mouth daily before breakfast.     [provider]  loratadine (CLARITIN) 10 MG tablet Take 1 tablet (10 mg total) by mouth daily as needed for allergies or rhinitis. 05/24/17   Loren Racer, MD  metoCLOPramide (REGLAN) 10 MG tablet Take 1 tablet (10 mg total) by mouth every 6 (six) hours. 05/24/17   Loren Racer, MD  metoprolol succinate (TOPROL-XL) 50 MG 24 hr tablet TAKE (1) TABLET BY MOUTH EACH MORNING WITH OR IMMEDIATELY FOLLOWING AMEAL. 04/01/17   Dina Rich  F, MD  Multiple Vitamin (MULTIVITAMIN WITH MINERALS) TABS tablet Take 1 tablet by mouth 2 (two) times daily. 08/28/13   Antoine Poche, MD  nitroGLYCERIN (NITROSTAT) 0.4 MG SL tablet DISSOLVE 1 TABLET UNDER TONGUE EVERY 5 MINUTES UP TO 15 MIN FOR CHESTPAIN. IF NO RELIEF CALL 911. 08/24/16   Antoine Poche, MD  omega-3 acid ethyl esters (LOVAZA) 1 G capsule Take 2 capsules (2 g total) by mouth 2 (two) times daily. 06/05/15   Janetta Hora, PA-C  omeprazole (PRILOSEC) 20 MG capsule TAKE (1) CAPSULE BY MOUTH ONCE DAILY. 03/02/17   Antoine Poche, MD  potassium chloride SA (K-DUR,KLOR-CON) 20 MEQ tablet TAKE (1) TABLET BY MOUTH EACH MORNING. 07/01/16   Antoine Poche, MD  prasugrel (EFFIENT) 10 MG TABS tablet TAKE (1) TABLET BY MOUTH EACH MORNING. 04/28/17   Antoine Poche, MD    Family History Family History  Problem Relation Age of Onset  . Heart attack Mother   . Heart disease Mother   . Breast cancer Mother   . Heart attack Father   . Heart disease Father   . Colon cancer Neg Hx     Social History Social History  Substance Use Topics  . Smoking status: Former Smoker    Packs/day: 0.50    Years: 21.00    Types: Cigarettes    Start date: 03/19/1993    Quit date: 07/29/2015  . Smokeless tobacco: Never Used     Comment: smokes some time/go  weeks without smoking  . Alcohol use No     Allergies   Pneumococcal vaccines; Adhesive [tape]; Crestor [rosuvastatin]; Lipitor [atorvastatin]; and Morphine and related   Review of Systems Review of Systems  Constitutional: Negative for chills and fever.  HENT: Positive for congestion, sinus pain and sinus pressure. Negative for sore throat and trouble swallowing.   Eyes: Positive for photophobia. Negative for visual disturbance.  Respiratory: Negative for cough and shortness of breath.   Cardiovascular: Negative for chest pain, palpitations and leg swelling.  Gastrointestinal: Positive for abdominal pain, diarrhea, nausea and vomiting.  Genitourinary: Negative for dysuria, flank pain, frequency and hematuria.  Musculoskeletal: Negative for back pain, neck pain and neck stiffness.  Skin: Negative for rash and wound.  Neurological: Positive for headaches. Negative for dizziness, syncope, weakness, light-headedness and numbness.  All other systems reviewed and are negative.    Physical Exam Updated Vital Signs BP (!) 155/76 (BP Location: Right Arm)   Pulse (!) 58   Temp 98.3 F (36.8 C) (Oral)   Resp 15   Ht 5\' 2"  (1.575 m)   Wt 97.5 kg (215 lb)   SpO2 95%   BMI 39.32 kg/m   Physical Exam  Constitutional: She is oriented to person, place, and time. She appears well-developed and well-nourished. No distress.  HENT:  Head: Normocephalic and atraumatic.  Mouth/Throat: Oropharynx is clear and moist.  Bilateral nasal mucosal edema. Tenderness to percussion over maxillary sinuses. No temporal artery tenderness to palpation.  Eyes: Pupils are equal, round, and reactive to light. EOM are normal.  Neck: Normal range of motion. Neck supple.  No meningismus.  Cardiovascular: Normal rate and regular rhythm.  Exam reveals no gallop and no friction rub.   No murmur heard. Pulmonary/Chest: Effort normal and breath sounds normal. No respiratory distress. She has no wheezes. She has no  rales. She exhibits no tenderness.  Abdominal: Soft. Bowel sounds are normal. There is tenderness. There is no rebound and no guarding.  Epigastric tenderness with palpation. No rebound or guarding.  Musculoskeletal: Normal range of motion. She exhibits no edema or tenderness.  No midline thoracic or lumbar tenderness. No CVA tenderness bilaterally. No lower extremity swelling, asymmetry or tenderness.  Neurological: She is alert and oriented to person, place, and time.  Patient is alert and oriented x3 with clear, goal oriented speech. Patient has 5/5 motor in all extremities. Sensation is intact to light touch. Bilateral finger-to-nose is normal with no signs of dysmetria.   Skin: Skin is warm and dry. Capillary refill takes less than 2 seconds. No rash noted. She is not diaphoretic. No erythema.  Psychiatric: She has a normal mood and affect. Her behavior is normal.  Nursing note and vitals reviewed.    ED Treatments / Results  Labs (all labs ordered are listed, but only abnormal results are displayed) Labs Reviewed  CBC WITH DIFFERENTIAL/PLATELET - Abnormal; Notable for the following:       Result Value   RBC 3.35 (*)    Hemoglobin 10.7 (*)    HCT 31.2 (*)    All other components within normal limits  COMPREHENSIVE METABOLIC PANEL - Abnormal; Notable for the following:    Potassium 3.1 (*)    Glucose, Bld 117 (*)    AST 45 (*)    ALT 113 (*)    All other components within normal limits  TROPONIN I - Abnormal; Notable for the following:    Troponin I 0.03 (*)    All other components within normal limits  ACETAMINOPHEN LEVEL - Abnormal; Notable for the following:    Acetaminophen (Tylenol), Serum <10 (*)    All other components within normal limits  HEPATITIS PANEL, ACUTE - Abnormal; Notable for the following:    Hep B C IgM Positive (*)    All other components within normal limits  TROPONIN I - Abnormal; Notable for the following:    Troponin I 0.03 (*)    All other  components within normal limits  LIPASE, BLOOD    EKG  EKG Interpretation  Date/Time:  Monday May 24 2017 18:58:37 EDT Ventricular Rate:  58 PR Interval:    QRS Duration: 108 QT Interval:  483 QTC Calculation: 475 R Axis:   30 Text Interpretation:  Sinus rhythm Borderline short PR interval Abnormal inferior Q waves Repol abnrm suggests ischemia, lateral leads Confirmed by Benjiman Core [phone]) on 05/25/2017 8:44:47 PM       Radiology No results found.  Procedures Procedures (including critical care time)  Medications Ordered in ED Medications  sodium chloride 0.9 % bolus 1,000 mL (0 mLs Intravenous Stopped 05/24/17 2050)  metoCLOPramide (REGLAN) injection 10 mg (10 mg Intravenous Given 05/24/17 1907)  diphenhydrAMINE (BENADRYL) injection 25 mg (25 mg Intravenous Given 05/24/17 1907)  methylPREDNISolone sodium succinate (SOLU-MEDROL) 125 mg/2 mL injection 125 mg (125 mg Intravenous Given 05/24/17 1936)  HYDROmorphone (DILAUDID) injection 0.5 mg (0.5 mg Intravenous Given 05/24/17 1907)  potassium chloride 10 mEq in 100 mL IVPB (0 mEq Intravenous Stopped 05/24/17 2340)  iopamidol (ISOVUE-300) 61 % injection 100 mL (100 mLs Intravenous Contrast Given 05/24/17 2058)  potassium chloride SA (K-DUR,KLOR-CON) CR tablet 40 mEq (40 mEq Oral Given 05/24/17 2200)     Initial Impression / Assessment and Plan / ED Course  I have reviewed the triage vital signs and the nursing notes.  Pertinent labs & imaging results that were available during my care of the patient were reviewed by me and considered in my medical decision making (see  chart for details).     Patient states her headache has improved. No further vomiting in the emergency department. CT without acute findings. Initial troponin was 0.03. Patient continues to deny any chest pain. Will get repeat troponin. Patient continues to have a normal neurologic exam. Headache with migrainous and sinusitis features. Will start on  antihistamine. Signed out to oncoming emergency physician pending repeat troponin. Anticipate discharge home. Final Clinical Impressions(s) / ED Diagnoses   Final diagnoses:  Nausea vomiting and diarrhea  Nonintractable headache, unspecified chronicity pattern, unspecified headache type  Sinus congestion  Hypokalemia  Elevated liver enzymes    New Prescriptions Discharge Medication List as of 05/24/2017 10:31 PM    START taking these medications   Details  loratadine (CLARITIN) 10 MG tablet Take 1 tablet (10 mg total) by mouth daily as needed for allergies or rhinitis., Starting Mon 05/24/2017, Print    metoCLOPramide (REGLAN) 10 MG tablet Take 1 tablet (10 mg total) by mouth every 6 (six) hours., Starting Mon 05/24/2017, Print         Loren Racer, MD 05/28/17 [phone]

## 2017-05-24 NOTE — ED Notes (Signed)
Pt ambulatory to waiting room. Pt verbalized understanding of discharge instructions.   

## 2017-05-26 ENCOUNTER — Encounter: Payer: Self-pay | Admitting: Internal Medicine

## 2017-05-26 LAB — HEPATITIS PANEL, ACUTE
HCV Ab: <0.1 s/co ratio (ref 0.0–0.9)
HEP B S AG: NEGATIVE
Hep A IgM: NEGATIVE
Hep B C IgM: POSITIVE — AB

## 2017-05-26 NOTE — Progress Notes (Signed)
PATIENT SCHEDULED  °

## 2017-05-27 ENCOUNTER — Ambulatory Visit: Payer: Self-pay | Admitting: Gastroenterology

## 2017-05-28 ENCOUNTER — Other Ambulatory Visit: Payer: Self-pay | Admitting: Cardiology

## 2017-06-17 ENCOUNTER — Other Ambulatory Visit: Payer: Self-pay | Admitting: Cardiology

## 2017-06-17 ENCOUNTER — Ambulatory Visit: Payer: Self-pay | Admitting: Cardiology

## 2017-06-17 NOTE — Progress Notes (Deleted)
Clinical Summary Ms. Cerino is a 61 y.o.female seen today for follow up of the following medical problems.    1. CAD  - CABG 2005 4 vessel  last cath 05/2015 results as reported below, no targets for pci, recs for medical management. Was started on ranexa 56m bid, had headache on nitrates. She attributes that episode to severe stress, had lost a very close friend at that time.   - pressure like pain midchest 2-3 weeks ago. 10/10 in severity. Occurred while walking around at home. +SOB, hot sweaty, nauseous.  - took NG, called EMS. Negative workup. Pain not better with NG. Pain lasted just a few minutes. Lots of overwhelming stress at that time - reports other episode in June. Recent increase in xanax per pcp due to severe ongoing anxiety.   - headaches on ranexa.     2. Carotid stenosis  - Carotid [place] 40-59% bilateral - denies any recent stroke like symptoms    3. HTN  - compliant meds    4. Hyperlipidemia  - did not tolerate crestor, prava, or lovastatin due to side effects.   Past Medical History:  Diagnosis Date  . Acute renal failure ([place]   . Anemia   . Anxiety   . Arthritis   . Asthma   . Carotid artery disease ([place]    a. Duplex 50-69% BICA 03/2014.  .Marland KitchenChronic back pain   . COPD (chronic obstructive pulmonary disease) ([place]   . Coronary artery disease    a. s/p prior CABG 2005. b. BMS 02/2009 to VG-PDA. c. NSTEMI 08/2010 s/p PTCA/DES to SVG-PDA in previously stented area. d. DES to SVG to RCA on 05/24/14  c. LHC 06/04/2015 w/ no sig change in her anatomy and Rx managment  . DDD (degenerative disc disease)   . Depression   . Diverticulitis    4 documented episodes by CT; most recent April 2012  . GERD (gastroesophageal reflux disease)   . History of blood transfusion   . HTN (hypertension)   . Hyperlipidemia   . Hypertension   . Hypothyroidism   . Ischemic cardiomyopathy    a. EF 40% in 2012 by cath. b. improved to 50-55% by cath  05/2014  [facility]  . Kidney stones   . Seizures ([place]   . Sinus bradycardia   . Sleep apnea    a. Mild-to-moderate obstructive sleep apnea diagnosed in April 2012 by sleep study.  . Tension headache      Allergies  Allergen Reactions  . Pneumococcal Vaccines Anxiety, Palpitations and Cough  . Adhesive [Tape] Itching and Other (See Comments)    Blisters (pls use paper tape)  . Crestor [Rosuvastatin] Other (See Comments)    Muscle pain  . Lipitor [Atorvastatin] Other (See Comments)    Cramps  . Morphine And Related Itching    Pt states she takes with benadryl     Current Outpatient Prescriptions  Medication Sig Dispense Refill  . albuterol (PROAIR HFA) 108 (90 BASE) MCG/ACT inhaler Inhale 2 puffs into the lungs every 6 (six) hours as needed for wheezing or shortness of breath.    .Marland Kitchenalbuterol (PROVENTIL) (2.5 MG/3ML) 0.083% nebulizer solution Take 2.5 mg by nebulization every 6 (six) hours as needed for wheezing or shortness of breath.    . ALPRAZolam (XANAX) 1 MG tablet Take 1 mg by mouth 4 (four) times daily as needed for anxiety or sleep.     .Marland KitchenamLODipine (NORVASC)  10 MG tablet TAKE ONE TABLET BY MOUTH ONCE DAILY. 30 tablet 6  . aspirin 325 MG tablet Take 325 mg by mouth daily.    . benazepril (LOTENSIN) 10 MG tablet TAKE (1) TABLET BY MOUTH EACH MORNING. 90 tablet 1  . butalbital-acetaminophen-caffeine (FIORICET, ESGIC) 50-325-40 MG tablet Take 1 tablet by mouth every 4 (four) hours as needed for headache. 14 tablet 0  . ezetimibe (ZETIA) 10 MG tablet TAKE ONE TABLET BY MOUTH ONCE DAILY. 15 tablet 0  . fluticasone (FLONASE) 50 MCG/ACT nasal spray Place 2 sprays into both nostrils daily as needed for allergies or rhinitis.    . furosemide (LASIX) 80 MG tablet TAKE ONE TABLET DAILY. 30 tablet 3  . HYDROcodone-acetaminophen (NORCO) 10-325 MG per tablet Take 1 tablet by mouth every 4 (four) hours as needed for moderate pain (pain).     . isosorbide mononitrate  (IMDUR) 30 MG 24 hr tablet TAKE 1 AND 1/2 TABLETS BY MOUTH ONCE DAILY. 45 tablet 0  . levETIRAcetam (KEPPRA) 250 MG tablet Take 250 mg by mouth 2 (two) times daily.    Marland Kitchen levothyroxine (SYNTHROID, LEVOTHROID) 75 MCG tablet Take 75 mcg by mouth daily before breakfast.     . loratadine (CLARITIN) 10 MG tablet Take 1 tablet (10 mg total) by mouth daily as needed for allergies or rhinitis. 30 tablet 0  . metoCLOPramide (REGLAN) 10 MG tablet Take 1 tablet (10 mg total) by mouth every 6 (six) hours. 30 tablet 0  . metoprolol succinate (TOPROL-XL) 50 MG 24 hr tablet TAKE (1) TABLET BY MOUTH EACH MORNING WITH OR IMMEDIATELY FOLLOWING AMEAL. 30 tablet 6  . Multiple Vitamin (MULTIVITAMIN WITH MINERALS) TABS tablet Take 1 tablet by mouth 2 (two) times daily. 25 tablet 4  . nitroGLYCERIN (NITROSTAT) 0.4 MG SL tablet DISSOLVE 1 TABLET UNDER TONGUE EVERY 5 MINUTES UP TO 15 MIN FOR CHESTPAIN. IF NO RELIEF CALL 911. 25 tablet 3  . omega-3 acid ethyl esters (LOVAZA) 1 G capsule Take 2 capsules (2 g total) by mouth 2 (two) times daily. 60 capsule 11  . omeprazole (PRILOSEC) 20 MG capsule TAKE (1) CAPSULE BY MOUTH ONCE DAILY. 30 capsule 2  . potassium chloride SA (K-DUR,KLOR-CON) 20 MEQ tablet TAKE (1) TABLET BY MOUTH EACH MORNING. 30 tablet 3  . prasugrel (EFFIENT) 10 MG TABS tablet TAKE (1) TABLET BY MOUTH EACH MORNING. 30 tablet 0   No current facility-administered medications for this visit.      Past Surgical History:  Procedure Laterality Date  . APPENDECTOMY  ~ 1970  . CARDIAC CATHETERIZATION  Jan 2012   patent stents in RCA graft; CAD stable  . CARDIAC CATHETERIZATION N/A 06/03/2015   Procedure: Left Heart Cath and Cors/Grafts Angiography;  Surgeon: Belva Crome, MD;  Location: [facility];  Service: Cardiovascular;  Laterality: N/A;  . CAROTID ENDARTERECTOMY  2005  . CARPAL TUNNEL WITH CUBITAL TUNNEL Right 2001  . CORONARY ANGIOPLASTY WITH STENT PLACEMENT  2011   Promus Premier DES, 3.5 mm x  12 mm, for 99% proximal stent ISR in the SVG-RPDA   . CORONARY ANGIOPLASTY WITH STENT PLACEMENT  05/24/2014  . CORONARY ARTERY BYPASS GRAFT  2005   LIMA to AL, SVG to LAD, SVG to Cx, SVG-PDA  . KNEE ARTHROSCOPY W/ ACL RECONSTRUCTION Left 2001  . LAPAROSCOPIC CHOLECYSTECTOMY  2010  . LEFT HEART CATHETERIZATION WITH CORONARY/GRAFT ANGIOGRAM N/A 05/24/2014   Procedure: LEFT HEART CATHETERIZATION WITH Beatrix Fetters;  Surgeon: Leonie Man, MD; EF  50-55%, LAD & D1 100%, LIMA-D1 OK, CFX OK, OM1 & OM2 100%, lat OM Jerris Fleer 100%, RCA 100%; pSVG-RPDA 99% rx w/ scoring balloon PTCA & 3.5 x 12 mm Promus DES; SVG-LAD, SVG-OM known occluded      . TUBAL LIGATION  ~ 1989  . VAGINAL HYSTERECTOMY  ~ 1993     Allergies  Allergen Reactions  . Pneumococcal Vaccines Anxiety, Palpitations and Cough  . Adhesive [Tape] Itching and Other (See Comments)    Blisters (pls use paper tape)  . Crestor [Rosuvastatin] Other (See Comments)    Muscle pain  . Lipitor [Atorvastatin] Other (See Comments)    Cramps  . Morphine And Related Itching    Pt states she takes with benadryl      Family History  Problem Relation Age of Onset  . Heart attack Mother   . Heart disease Mother   . Breast cancer Mother   . Heart attack Father   . Heart disease Father   . Colon cancer Neg Hx      Social History Ms. Dotzler reports that she quit smoking about 22 months ago. Her smoking use included Cigarettes. She started smoking about 24 years ago. She has a 10.50 pack-year smoking history. She has never used smokeless tobacco. Ms. Tang reports that she does not drink alcohol.   Review of Systems CONSTITUTIONAL: No weight loss, fever, chills, weakness or fatigue.  HEENT: Eyes: No visual loss, blurred vision, double vision or yellow sclerae.No hearing loss, sneezing, congestion, runny nose or sore throat.  SKIN: No rash or itching.  CARDIOVASCULAR:  RESPIRATORY: No shortness of breath, cough or sputum.    GASTROINTESTINAL: No anorexia, nausea, vomiting or diarrhea. No abdominal pain or blood.  GENITOURINARY: No burning on urination, no polyuria NEUROLOGICAL: No headache, dizziness, syncope, paralysis, ataxia, numbness or tingling in the extremities. No change in bowel or bladder control.  MUSCULOSKELETAL: No muscle, back pain, joint pain or stiffness.  LYMPHATICS: No enlarged nodes. No history of splenectomy.  PSYCHIATRIC: No history of depression or anxiety.  ENDOCRINOLOGIC: No reports of sweating, cold or heat intolerance. No polyuria or polydipsia.  Marland Kitchen   Physical Examination There were no vitals filed for this visit. There were no vitals filed for this visit.  Gen: resting comfortably, no acute distress HEENT: no scleral icterus, pupils equal round and reactive, no palptable cervical adenopathy,  CV Resp: Clear to auscultation bilaterally GI: abdomen is soft, non-tender, non-distended, normal bowel sounds, no hepatosplenomegaly MSK: extremities are warm, no edema.  Skin: warm, no rash Neuro:  no focal deficits Psych: appropriate affect   Diagnostic Studies Cath 2012 Left main coronary was normal.  Left anterior descending artery was occluded just after the septal  perforator. First diagonal Diavion Labrador was subtotally occluded. There was  some filling of the distal LAD and diagonals from collateral branches  from the LIMA. Circumflex coronary artery was occluded in the  midvessel. The AV groove Jaicee Michelotti was patent. There was a small first  obtuse marginal Izael Bessinger with 30-40% multiple lesions. There was a small  second obtuse marginal Ieisha Gao with 30-40% discrete lesions. There is a  very large obtuse marginal Marsalis Beaulieu seen filling from collaterals from the  left internal mammary artery.  Right coronary artery was 100% occluded proximally with both left-to-  right and right-to-right collaterals. The distal PDA also filled from  the vein graft to the right coronary artery.   The left internal mammary artery was widely patent. It also filled the  distal right  large obtuse marginal Dusten Ellinwood and filled retrograde to a  smaller diagonal Yamilee Harmes.  There was known occlusion of the 2 left-sided vein grafts. The  saphenous vein graft to the right coronary artery was widely patent.  Bare-metal stent in the mid-to-distal vessel widely patent. There was  poor runoff to the vessel primarily being retrograde into the posterior  lateral Cambren Helm, however, there was no new focal stenosis.  RAO ventriculography. RAO ventriculography showed anterior apical and  mid inferior wall hypokinesis. EF was in the 40% range.  Right heart catheterization showed mean pulmonary capillary wedge  pressure of 24, PA pressure was 55/26, RV pressure was 52/5, RA pressure  mean was 16, aortic pressure was 155/76, LV pressure was equal to 167/16  with a post A-wave EDP of 26.  Cardiac output was 6.3 liters per minute with a cardiac index of 3  liters per minute per meter squared by Fick.   IMPRESSION: The patient's coronary artery disease is stable. The  stents in the RCA graft were patent. There are no new lesions that I  can see. She does have collateralized distal right and OM Khylan Sawyer.  Continued medical therapy is warranted. If she continues to have chest  pain, Ranexa may be in order since it is somewhat late in the day and  she had both venous and arterial sheath. She will be kept overnight and  discharged in the morning. She tolerated the procedure well.   03/2014 Echo Study Conclusions  - Left ventricle: The cavity size was normal. Wall thickness was increased in a pattern of mild LVH. Systolic function was normal. The estimated ejection fraction was in the range of 50% to 55%. There is akinesis of the basalinferolateral myocardium. Features are consistent with a pseudonormal left ventricular filling pattern, with concomitant abnormal relaxation and  increased filling pressure (grade 2 diastolic dysfunction). - Aortic valve: Mildly calcified annulus. Trileaflet. There was no significant regurgitation. - Mitral valve: There was trivial regurgitation. - Left atrium: The atrium was mildly to moderately dilated. - Right ventricle: Systolic function was mildly reduced. - Right atrium: Central venous pressure (est): 3 mm Hg. - Atrial septum: No defect or patent foramen ovale was identified. - Tricuspid valve: There was trivial regurgitation. - Pulmonary arteries: Systolic pressure could not be accurately estimated. - Pericardium, extracardiac: There was no pericardial effusion.  Impressions:  - Mild LVH with LVEF 50-55%, basal inferolateral hypokinesis, grade 2 diastolic dysfunction. Mild to moderate left atrial enlargement. Mildly reduced RV contraction. Unable to assess PASP.  03/2014 Carotid US IMPRESSION:  Bilateral 50-69% stenosis. The left internal carotid peak systolic  velocity has increased slightly in the interval from the prior exam.  05/2014 Cath FINDINGS: Hemodynamics: 1. Central Aortic Pressure / Mean: 110/62/81 mmHg 2. Left Ventricular Pressure / LVEDP: 110/14/21 mmHg  Left Ventriculography:  EF:50 at 55 %  Wall Motion:Severe hypokinesis of the Basal to MID inferior-inferolateraL wall with mild hypokinesis of the basal anterior wall  Coronary Anatomy:  Dominance: Right  Left Main: Normal caliber vessel that itself is free of significant disease. Bifurcates into the LAD and Circumflex UTM:LYYTKPTW caliber vessel that is 100% occluded after a septal perforator and small diagonal Elis Sauber. There is a first diagonal Yosgart Pavey that is also half percent occluded after initial subtotal occlusion.  Left Circumflex:Moderate caliber vessel it gives rise to a proximal small caliber OM Malaijah Houchen that is somewhat tortuous and free of significant disease. The vessel then bifurcates into the introducer complex which  is small in  caliber and perfuses the basal inferolateral wall. There is a major lateral OM Estelene Carmack the visualized the proximal segment is occluded. There is late retrograde filling of the remainder the nature OM which appears to have 2 branches distally. This is faint collaterals from the proximal OM.   RCA: 100% proximal occluded. SVG-RPDA: Large-caliber graft with mildly irregular is proximally, there are overlapping stents in the distal mid graft with 99% napkin ring proximal in-stent restenosis of the most proximal stent that extends just minimally to the unstented segment. The remainder of the graft is relatively free of disease and attached to the mid RPDA.  RPDA: Large-caliber tortuous vessel that reaches almost to the apex.  RPL Sysytem:The RPAV fills via retrograde flow in the RPDA. There are several small posterolateral branches that are relatively free of disease. The PAV is relatively free of disease.  After reviewing the initial angiography, the culprit lesion was thought to be the 99% proximal stent ISR in the SVG-RPDA. Preparation were made to proceed with PCI on this lesion. As the lesion is focal &appears to be fibrous in nature and too stenosed to safely pass a filter wire, the decision was made to pre-dilate &stent without distal protection. No evidence of a filling defect was noted besides the "napkin-ring" lesion.  Percutaneous Coronary Intervention: Sheath exchanged for 6 Fr Guide: 6 Fr AL1Guidewire: BMW (after failed attempt with Pro-Water Predilation Balloon: Angioscult Cutting Balloon 3.5 mm x 10 mm;  ? 12 Atm x 45 Sec,-- excellent predilation of the fibrous lesion at high pressure. Brisk TIMI-3 flow down stream but no sign of no reflow. The decision was made to proceed with stenting. Stent: Promus Premier DES 3.5 mm x 12 mm; overlaps roughly 4 mm into the original stent. ? Deployed at: 20 Atm x 30 Sec Post-dilation Balloon: Wyatt Emerge 4.0 mm x 8 mm;   ? 14 Atm x 30 Sec x 2 -  ? Final Diameter: 4.1 mm  Post deployment angiography in multiple views, with and without guidewire in place revealed excellent stent deployment and lesion coverage. There was no evidence of dissection or perforation. Brisk TIMI-3 flow is noted in the downstream vessel  MEDICATIONS:  Anesthesia: Local Lidocaine 2 ml  Sedation: 4 mg IV Versed, 100 mcg IV fentanyl ;   Omnipaque Contrast: 180 ml  Anticoagulation: IV Heparin 13500 Units total   Anti-Platelet Agent: Effient and aspirin as standing home medication  PATIENT DISPOSITION:   The patient was transferred to the PACU holding area in a hemodynamicaly stable, chest pain free condition.  The patient tolerated the procedure well, and there were no complications. EBL: <5 ml  The patient was stable before, during, and after the procedure.  POST-OPERATIVE DIAGNOSIS:   Severe native CAD with 100% negative RCA, LAD & D1 along with OM 1 and OM 2 as previously described  Severe proximal edge in-stent restenosis of SVG-RPDA   Successful PCI of the proximal edge in-stent restenosis of SVG RCA with scoring balloon angioplasty followed by DES stent placement: Promus Premier 3.5 mm x 12 mm postdilated to 4.1 mm  Widely patent LIMA-diagonal with distal collaterals to what appears to be the apical LAD.  Known occlusion of the SVG-LAD and SVG-OM  Relatively preserved LVEF as noted on the cardiogram recently. There is known hypokinesis to akinesis of the nasal to mid inferior inferolateral wall as well as mild hypokinesis of the basal anterior wall assistant with known CAD.  PLAN OF CARE:  Overnight observation post-PCI. Continue home  doses of aspirin plus Effient.  Standard post radial cath care with care being removal   Discharge in the morning if stable.  Followup with Dr. Harl Bowie - continue to optimize medical management of existing severe CAD.   05/2015 Cath        Dominance: Co-dominant          Left Anterior Descending   . Mid LAD lesion, 100% stenosed.   . First Diagonal Manvir Thorson   The vessel is small in size.   . 1st Diag lesion, 99% stenosed.       Ramus Intermedius  The vessel is small .          Left Circumflex  The vessel is small .   Marland Kitchen Mid Cx to Dist Cx lesion, 100% stenosed.   . Second Obtuse Marginal Breelyn Icard   The vessel is small in size.   . Third Obtuse Marginal Nakayla Rorabaugh   3rd Mrg filled by collaterals from 1st Mrg.          Right Coronary Artery   . Prox RCA to Mid RCA lesion, 100% stenosed.   . Dist RCA lesion, 100% stenosed.          Graft Angiography        Free Graft to Dist LAD  SVG   . Prox Graft to Mid Graft lesion, 100% stenosed.         Free Graft to Dist RCA  SVG   . Mid Graft lesion, 30% stenosed. The lesion was previously treated with a stent (unknown type) .         Free Graft to 2nd Mrg  SVG   . Origin to Prox Graft lesion, 100% stenosed.     Free LIMA Graft to 2nd Diag  LIMA        Cath RECOMMENDATIONS:   Continued aggressive risk factor modification  No anatomy amenable to percutaneous intervention.     Assessment and Plan  1. CAD  -- recent symptoms associated with severe stress/anxiety, no exertional symptoms.  - we will increase imdur to 25m daily, follow symptoms.  - notes very easy bruising, we will stop her ASA. Continue effient for secondary prevention given her long significant CAD history.  2. Carotid stenosis  - moderate by last [place] - we will repeat later this year  3. HTN  - her bis at goal. Continue current meds  4.  Hyperlipidemia - has not tolerated statins. - request labs form pcp - may consider zetia in the future   F/u 2 months      JArnoldo Lenis M.D., F.A.C.C.

## 2017-06-23 ENCOUNTER — Other Ambulatory Visit: Payer: Self-pay | Admitting: Cardiology

## 2017-07-05 ENCOUNTER — Other Ambulatory Visit: Payer: Self-pay | Admitting: Cardiology

## 2017-07-09 ENCOUNTER — Telehealth: Payer: Self-pay | Admitting: Gastroenterology

## 2017-07-09 ENCOUNTER — Encounter: Payer: Self-pay | Admitting: Gastroenterology

## 2017-07-09 ENCOUNTER — Ambulatory Visit: Payer: Medicaid Other | Admitting: Gastroenterology

## 2017-07-09 NOTE — Telephone Encounter (Signed)
PATIENT WAS A NO SHOW AND LETTER SENT  °

## 2017-07-10 ENCOUNTER — Other Ambulatory Visit: Payer: Self-pay | Admitting: Cardiology

## 2017-07-28 ENCOUNTER — Other Ambulatory Visit: Payer: Self-pay | Admitting: Cardiology

## 2017-08-03 ENCOUNTER — Other Ambulatory Visit: Payer: Self-pay | Admitting: Cardiology

## 2017-08-09 ENCOUNTER — Other Ambulatory Visit: Payer: Self-pay

## 2017-08-09 ENCOUNTER — Other Ambulatory Visit: Payer: Self-pay | Admitting: Cardiology

## 2017-08-09 MED ORDER — OMEPRAZOLE 20 MG PO CPDR: DELAYED_RELEASE_CAPSULE | ORAL | 0 refills | Status: DC

## 2017-08-09 MED ORDER — BENAZEPRIL HCL 10 MG PO TABS: ORAL_TABLET | ORAL | 0 refills | Status: DC

## 2017-08-16 ENCOUNTER — Encounter: Payer: Self-pay | Admitting: *Deleted

## 2017-08-16 ENCOUNTER — Other Ambulatory Visit: Payer: Self-pay | Admitting: Cardiology

## 2017-08-16 ENCOUNTER — Encounter: Payer: Self-pay | Admitting: Cardiology

## 2017-08-16 ENCOUNTER — Ambulatory Visit: Payer: Medicaid Other | Admitting: Cardiology

## 2017-08-16 ENCOUNTER — Other Ambulatory Visit: Payer: Self-pay

## 2017-08-16 ENCOUNTER — Telehealth: Payer: Self-pay | Admitting: Cardiology

## 2017-08-16 VITALS — BP 118/70 | HR 53 | Ht 63.0 in | Wt 216.0 lb

## 2017-08-16 DIAGNOSIS — I10 Essential (primary) hypertension: Secondary | ICD-10-CM

## 2017-08-16 DIAGNOSIS — E782 Mixed hyperlipidemia: Secondary | ICD-10-CM | POA: Diagnosis not present

## 2017-08-16 DIAGNOSIS — I6523 Occlusion and stenosis of bilateral carotid arteries: Secondary | ICD-10-CM | POA: Diagnosis not present

## 2017-08-16 DIAGNOSIS — I2510 Atherosclerotic heart disease of native coronary artery without angina pectoris: Secondary | ICD-10-CM

## 2017-08-16 NOTE — Patient Instructions (Signed)
Your physician wants you to follow-up in: [facility] You will receive a reminder letter in the mail two months in advance. If you don't receive a letter, please call our office to schedule the follow-up appointment.  Your physician has recommended you make the following change in your medication:   DECREASE ASPIRIN 81 MG DAILY  Your physician has requested that you have a carotid duplex. This test is an ultrasound of the carotid arteries in your neck. It looks at blood flow through these arteries that supply the brain with blood. Allow one hour for this exam. There are no restrictions or special instructions.  Thank you for choosing [facility]!!

## 2017-08-16 NOTE — Progress Notes (Signed)
Clinical Summary [name] is a 61 y.o.female seen today for follow up of the following medical problems.    1. CAD  - CABG 2005 4 vessel  last cath 05/2015 results as reported below, no targets for pci, recs for medical management. Was started on ranexa 575m bid, had headache on nitrates. She attributes that episode to severe stress, had lost a very close friend at that time.   - headaches on ranexa.   - no recent chest pain, no SOB/DOE - compliant with meds  2. Carotid stenosis  - Carotid [place] 40-59% bilateral - no recent neuro symptoms   3. HTN  - she is compliant with meds  4. Hyperlipidemia  - did not tolerate crestor, prava, or lovastatin due to side effects.  - currently on zetia   Past Medical History:  Diagnosis Date  . Acute renal failure ([place]   . Anemia   . Anxiety   . Arthritis   . Asthma   . Carotid artery disease ([place]    a. Duplex 50-69% BICA 03/2014.  .Marland KitchenChronic back pain   . COPD (chronic obstructive pulmonary disease) ([place]   . Coronary artery disease    a. s/p prior CABG 2005. b. BMS 02/2009 to VG-PDA. c. NSTEMI 08/2010 s/p PTCA/DES to SVG-PDA in previously stented area. d. DES to SVG to RCA on 05/24/14  c. LHC 06/04/2015 w/ no sig change in her anatomy and Rx managment  . DDD (degenerative disc disease)   . Depression   . Diverticulitis    4 documented episodes by CT; most recent April 2012  . GERD (gastroesophageal reflux disease)   . History of blood transfusion   . HTN (hypertension)   . Hyperlipidemia   . Hypertension   . Hypothyroidism   . Ischemic cardiomyopathy    a. EF 40% in 2012 by cath. b. improved to 50-55% by cath 05/2014  [facility]  . Kidney stones   . Seizures ([place]   . Sinus bradycardia   . Sleep apnea    a. Mild-to-moderate obstructive sleep apnea diagnosed in April 2012 by sleep study.  . Tension headache      Allergies  Allergen Reactions  . Pneumococcal Vaccines Anxiety,  Palpitations and Cough  . Adhesive [Tape] Itching and Other (See Comments)    Blisters (pls use paper tape)  . Crestor [Rosuvastatin] Other (See Comments)    Muscle pain  . Lipitor [Atorvastatin] Other (See Comments)    Cramps  . Morphine And Related Itching    Pt states she takes with benadryl     Current Outpatient Medications  Medication Sig Dispense Refill  . albuterol (PROAIR HFA) 108 (90 BASE) MCG/ACT inhaler Inhale 2 puffs into the lungs every 6 (six) hours as needed for wheezing or shortness of breath.    .Marland Kitchenalbuterol (PROVENTIL) (2.5 MG/3ML) 0.083% nebulizer solution Take 2.5 mg by nebulization every 6 (six) hours as needed for wheezing or shortness of breath.    . ALPRAZolam (XANAX) 1 MG tablet Take 1 mg by mouth 4 (four) times daily as needed for anxiety or sleep.     .Marland KitchenamLODipine (NORVASC) 10 MG tablet TAKE (1) TABLET BY MOUTH ONCE DAILY. 90 tablet 0  . aspirin 325 MG tablet Take 325 mg by mouth daily.    . benazepril (LOTENSIN) 10 MG tablet TAKE (1) TABLET BY MOUTH EACH MORNING. 15 tablet 0  . butalbital-acetaminophen-caffeine (FIORICET, ESGIC) 50-325-40 MG tablet Take  1 tablet by mouth every 4 (four) hours as needed for headache. 14 tablet 0  . ezetimibe (ZETIA) 10 MG tablet TAKE ONE TABLET BY MOUTH ONCE DAILY. 15 tablet 0  . fluticasone (FLONASE) 50 MCG/ACT nasal spray Place 2 sprays into both nostrils daily as needed for allergies or rhinitis.    . furosemide (LASIX) 80 MG tablet TAKE ONE TABLET DAILY. 30 tablet 3  . HYDROcodone-acetaminophen (NORCO) 10-325 MG per tablet Take 1 tablet by mouth every 4 (four) hours as needed for moderate pain (pain).     . isosorbide mononitrate (IMDUR) 30 MG 24 hr tablet TAKE 1 AND 1/2 TABLETS BY MOUTH ONCE DAILY. 45 tablet 0  . levETIRAcetam (KEPPRA) 250 MG tablet Take 250 mg by mouth 2 (two) times daily.    Marland Kitchen levothyroxine (SYNTHROID, LEVOTHROID) 75 MCG tablet Take 75 mcg by mouth daily before breakfast.     . loratadine (CLARITIN) 10 MG  tablet Take 1 tablet (10 mg total) by mouth daily as needed for allergies or rhinitis. 30 tablet 0  . metoCLOPramide (REGLAN) 10 MG tablet Take 1 tablet (10 mg total) by mouth every 6 (six) hours. 30 tablet 0  . metoprolol succinate (TOPROL-XL) 50 MG 24 hr tablet TAKE (1) TABLET BY MOUTH EACH MORNING WITH OR IMMEDIATELY FOLLOWING AMEAL. 90 tablet 0  . Multiple Vitamin (MULTIVITAMIN WITH MINERALS) TABS tablet Take 1 tablet by mouth 2 (two) times daily. 25 tablet 4  . nitroGLYCERIN (NITROSTAT) 0.4 MG SL tablet DISSOLVE 1 TABLET UNDER TONGUE EVERY 5 MINUTES UP TO 15 MIN FOR CHESTPAIN. IF NO RELIEF CALL 911. 25 tablet 3  . omega-3 acid ethyl esters (LOVAZA) 1 G capsule Take 2 capsules (2 g total) by mouth 2 (two) times daily. 60 capsule 11  . omeprazole (PRILOSEC) 20 MG capsule TAKE (1) CAPSULE BY MOUTH ONCE DAILY. 15 capsule 0  . potassium chloride SA (K-DUR,KLOR-CON) 20 MEQ tablet TAKE (1) TABLET BY MOUTH EACH MORNING. 30 tablet 3  . prasugrel (EFFIENT) 10 MG TABS tablet TAKE (1) TABLET BY MOUTH EACH MORNING. 30 tablet 0   No current facility-administered medications for this visit.      Past Surgical History:  Procedure Laterality Date  . APPENDECTOMY  ~ 1970  . CARDIAC CATHETERIZATION  Jan 2012   patent stents in RCA graft; CAD stable  . CAROTID ENDARTERECTOMY  2005  . CARPAL TUNNEL WITH CUBITAL TUNNEL Right 2001  . CORONARY ANGIOPLASTY WITH STENT PLACEMENT  2011   Promus Premier DES, 3.5 mm x 12 mm, for 99% proximal stent ISR in the SVG-RPDA   . CORONARY ANGIOPLASTY WITH STENT PLACEMENT  05/24/2014  . CORONARY ARTERY BYPASS GRAFT  2005   LIMA to AL, SVG to LAD, SVG to Cx, SVG-PDA  . KNEE ARTHROSCOPY W/ ACL RECONSTRUCTION Left 2001  . LAPAROSCOPIC CHOLECYSTECTOMY  2010  . TUBAL LIGATION  ~ 1989  . VAGINAL HYSTERECTOMY  ~ 1993     Allergies  Allergen Reactions  . Pneumococcal Vaccines Anxiety, Palpitations and Cough  . Adhesive [Tape] Itching and Other (See Comments)    Blisters  (pls use paper tape)  . Crestor [Rosuvastatin] Other (See Comments)    Muscle pain  . Lipitor [Atorvastatin] Other (See Comments)    Cramps  . Morphine And Related Itching    Pt states she takes with benadryl      Family History  Problem Relation Age of Onset  . Heart attack Mother   . Heart disease Mother   .  Breast cancer Mother   . Heart attack Father   . Heart disease Father   . Colon cancer Neg Hx      Social History Ms. Wieczorek reports that she quit smoking about 2 years ago. Her smoking use included cigarettes. She started smoking about 24 years ago. She has a 10.50 pack-year smoking history. she has never used smokeless tobacco. Ms. Tuch reports that she does not drink alcohol.   Review of Systems CONSTITUTIONAL: No weight loss, fever, chills, weakness or fatigue.  HEENT: Eyes: No visual loss, blurred vision, double vision or yellow sclerae.No hearing loss, sneezing, congestion, runny nose or sore throat.  SKIN: No rash or itching.  CARDIOVASCULAR: per hpi RESPIRATORY: No shortness of breath, cough or sputum.  GASTROINTESTINAL: No anorexia, nausea, vomiting or diarrhea. No abdominal pain or blood.  GENITOURINARY: No burning on urination, no polyuria NEUROLOGICAL: No headache, dizziness, syncope, paralysis, ataxia, numbness or tingling in the extremities. No change in bowel or bladder control.  MUSCULOSKELETAL: No muscle, back pain, joint pain or stiffness.  LYMPHATICS: No enlarged nodes. No history of splenectomy.  PSYCHIATRIC: No history of depression or anxiety.  ENDOCRINOLOGIC: No reports of sweating, cold or heat intolerance. No polyuria or polydipsia.  Marland Kitchen   Physical Examination Vitals:   08/16/17 1351  BP: 118/70  Pulse: (!) 53  SpO2: 97%   Vitals:   08/16/17 1351  Weight: 216 lb (98 kg)  Height: 5' 3" (1.6 m)    Gen: resting comfortably, no acute distress HEENT: no scleral icterus, pupils equal round and reactive, no palptable cervical adenopathy,    CV: RRR, no m/r/g, no jvd Resp: Clear to auscultation bilaterally GI: abdomen is soft, non-tender, non-distended, normal bowel sounds, no hepatosplenomegaly MSK: extremities are warm, no edema.  Skin: warm, no rash Neuro:  no focal deficits Psych: appropriate affect   Diagnostic Studies Cath 2012 Left main coronary was normal.  Left anterior descending artery was occluded just after the septal  perforator. First diagonal branch was subtotally occluded. There was  some filling of the distal LAD and diagonals from collateral branches  from the LIMA. Circumflex coronary artery was occluded in the  midvessel. The AV groove branch was patent. There was a small first  obtuse marginal branch with 30-40% multiple lesions. There was a small  second obtuse marginal branch with 30-40% discrete lesions. There is a  very large obtuse marginal branch seen filling from collaterals from the  left internal mammary artery.  Right coronary artery was 100% occluded proximally with both left-to-  right and right-to-right collaterals. The distal PDA also filled from  the vein graft to the right coronary artery.  The left internal mammary artery was widely patent. It also filled the  distal right large obtuse marginal branch and filled retrograde to a  smaller diagonal branch.  There was known occlusion of the 2 left-sided vein grafts. The  saphenous vein graft to the right coronary artery was widely patent.  Bare-metal stent in the mid-to-distal vessel widely patent. There was  poor runoff to the vessel primarily being retrograde into the posterior  lateral branch, however, there was no new focal stenosis.  RAO ventriculography. RAO ventriculography showed anterior apical and  mid inferior wall hypokinesis. EF was in the 40% range.  Right heart catheterization showed mean pulmonary capillary wedge  pressure of 24, PA pressure was 55/26, RV pressure was 52/5, RA pressure   mean was 16, aortic pressure was 155/76, LV pressure was equal to 167/16  with a post A-wave EDP of 26.  Cardiac output was 6.3 liters per minute with a cardiac index of 3  liters per minute per meter squared by Fick.   IMPRESSION: The patient's coronary artery disease is stable. The  stents in the RCA graft were patent. There are no new lesions that I  can see. She does have collateralized distal right and OM branch.  Continued medical therapy is warranted. If she continues to have chest  pain, Ranexa may be in order since it is somewhat late in the day and  she had both venous and arterial sheath. She will be kept overnight and  discharged in the morning. She tolerated the procedure well.   03/2014 Echo Study Conclusions  - Left ventricle: The cavity size was normal. Wall thickness was increased in a pattern of mild LVH. Systolic function was normal. The estimated ejection fraction was in the range of 50% to 55%. There is akinesis of the basalinferolateral myocardium. Features are consistent with a pseudonormal left ventricular filling pattern, with concomitant abnormal relaxation and increased filling pressure (grade 2 diastolic dysfunction). - Aortic valve: Mildly calcified annulus. Trileaflet. There was no significant regurgitation. - Mitral valve: There was trivial regurgitation. - Left atrium: The atrium was mildly to moderately dilated. - Right ventricle: Systolic function was mildly reduced. - Right atrium: Central venous pressure (est): 3 mm Hg. - Atrial septum: No defect or patent foramen ovale was identified. - Tricuspid valve: There was trivial regurgitation. - Pulmonary arteries: Systolic pressure could not be accurately estimated. - Pericardium, extracardiac: There was no pericardial effusion.  Impressions:  - Mild LVH with LVEF 50-55%, basal inferolateral hypokinesis, grade 2 diastolic dysfunction. Mild to moderate left atrial enlargement. Mildly  reduced RV contraction. Unable to assess PASP.  03/2014 Carotid US IMPRESSION:  Bilateral 50-69% stenosis. The left internal carotid peak systolic  velocity has increased slightly in the interval from the prior exam.  05/2014 Cath FINDINGS: Hemodynamics: 1. Central Aortic Pressure / Mean: 110/62/81 mmHg 2. Left Ventricular Pressure / LVEDP: 110/14/21 mmHg  Left Ventriculography:  EF:50 at 55 %  Wall Motion:Severe hypokinesis of the Basal to MID inferior-inferolateraL wall with mild hypokinesis of the basal anterior wall  Coronary Anatomy:  Dominance: Right  Left Main: Normal caliber vessel that itself is free of significant disease. Bifurcates into the LAD and Circumflex FYB:OFBPZWCH caliber vessel that is 100% occluded after a septal perforator and small diagonal branch. There is a first diagonal branch that is also half percent occluded after initial subtotal occlusion.  Left Circumflex:Moderate caliber vessel it gives rise to a proximal small caliber OM branch that is somewhat tortuous and free of significant disease. The vessel then bifurcates into the introducer complex which is small in caliber and perfuses the basal inferolateral wall. There is a major lateral OM branch the visualized the proximal segment is occluded. There is late retrograde filling of the remainder the nature OM which appears to have 2 branches distally. This is faint collaterals from the proximal OM.   RCA: 100% proximal occluded. SVG-RPDA: Large-caliber graft with mildly irregular is proximally, there are overlapping stents in the distal mid graft with 99% napkin ring proximal in-stent restenosis of the most proximal stent that extends just minimally to the unstented segment. The remainder of the graft is relatively free of disease and attached to the mid RPDA.  RPDA: Large-caliber tortuous vessel that reaches almost to the apex.  RPL Sysytem:The RPAV fills via retrograde flow in the  RPDA.  There are several small posterolateral branches that are relatively free of disease. The PAV is relatively free of disease.  After reviewing the initial angiography, the culprit lesion was thought to be the 99% proximal stent ISR in the SVG-RPDA. Preparation were made to proceed with PCI on this lesion. As the lesion is focal &appears to be fibrous in nature and too stenosed to safely pass a filter wire, the decision was made to pre-dilate &stent without distal protection. No evidence of a filling defect was noted besides the "napkin-ring" lesion.  Percutaneous Coronary Intervention: Sheath exchanged for 6 Fr Guide: 6 Fr AL1Guidewire: BMW (after failed attempt with Pro-Water Predilation Balloon: Angioscult Cutting Balloon 3.5 mm x 10 mm;  ? 12 Atm x 45 Sec,-- excellent predilation of the fibrous lesion at high pressure. Brisk TIMI-3 flow down stream but no sign of no reflow. The decision was made to proceed with stenting. Stent: Promus Premier DES 3.5 mm x 12 mm; overlaps roughly 4 mm into the original stent. ? Deployed at: 20 Atm x 30 Sec Post-dilation Balloon: [place] Emerge 4.0 mm x 8 mm;  ? 14 Atm x 30 Sec x 2 -  ? Final Diameter: 4.1 mm  Post deployment angiography in multiple views, with and without guidewire in place revealed excellent stent deployment and lesion coverage. There was no evidence of dissection or perforation. Brisk TIMI-3 flow is noted in the downstream vessel  MEDICATIONS:  Anesthesia: Local Lidocaine 2 ml  Sedation: 4 mg IV Versed, 100 mcg IV fentanyl ;   Omnipaque Contrast: 180 ml  Anticoagulation: IV Heparin 13500 Units total   Anti-Platelet Agent: Effient and aspirin as standing home medication  PATIENT DISPOSITION:   The patient was transferred to the PACU holding area in a hemodynamicaly stable, chest pain free condition.  The patient tolerated the procedure well, and there were no complications. EBL: <5 ml  The  patient was stable before, during, and after the procedure.  POST-OPERATIVE DIAGNOSIS:   Severe native CAD with 100% negative RCA, LAD & D1 along with OM 1 and OM 2 as previously described  Severe proximal edge in-stent restenosis of SVG-RPDA   Successful PCI of the proximal edge in-stent restenosis of SVG RCA with scoring balloon angioplasty followed by DES stent placement: Promus Premier 3.5 mm x 12 mm postdilated to 4.1 mm  Widely patent LIMA-diagonal with distal collaterals to what appears to be the apical LAD.  Known occlusion of the SVG-LAD and SVG-OM  Relatively preserved LVEF as noted on the cardiogram recently. There is known hypokinesis to akinesis of the nasal to mid inferior inferolateral wall as well as mild hypokinesis of the basal anterior wall assistant with known CAD.  PLAN OF CARE:  Overnight observation post-PCI. Continue home doses of aspirin plus Effient.  Standard post radial cath care with care being removal   Discharge in the morning if stable.  Followup with Dr. Harl Bowie - continue to optimize medical management of existing severe CAD.   05/2015 Cath       Dominance: Co-dominant          Left Anterior Descending   . Mid LAD lesion, 100% stenosed.   . First Diagonal Branch   The vessel is small in size.   . 1st Diag lesion, 99% stenosed.       [place]amus Intermedius  The vessel is small .          Left Circumflex  The vessel is small .   Marland Kitchen Mid Cx  to Dist Cx lesion, 100% stenosed.   . Second Obtuse Marginal Branch   The vessel is small in size.   . Third Obtuse Marginal Branch   3rd Mrg filled by collaterals from 1st Mrg.          Right Coronary Artery   . Prox RCA to Mid RCA lesion, 100% stenosed.   . Dist RCA lesion, 100% stenosed.            Graft Angiography        Free Graft to Dist LAD  SVG   . Prox Graft to Mid Graft lesion, 100% stenosed.         Free Graft to Dist RCA  SVG   . Mid Graft lesion, 30% stenosed. The lesion was previously treated with a stent (unknown type) .         Free Graft to 2nd Mrg  SVG   . Origin to Prox Graft lesion, 100% stenosed.     Free LIMA Graft to 2nd Diag  LIMA        Cath RECOMMENDATIONS:   Continued aggressive risk factor modification  No anatomy amenable to percutaneous intervention.       Assessment and Plan   1. CAD  - no recent symptoms -continue current meds  2. Carotid stenosis  - moderate by last [place] 08/2015 - recepeat carotid US  3. HTN  - at goal, continue current meds  4. Hyperlipidemia - has not tolerated statins. - continue zetia   F/u 6 months     Arnoldo Lenis, M.D.

## 2017-08-16 NOTE — Telephone Encounter (Signed)
[facility]

## 2017-08-20 ENCOUNTER — Other Ambulatory Visit: Payer: Self-pay | Admitting: Cardiology

## 2017-08-20 ENCOUNTER — Encounter: Payer: Self-pay | Admitting: Cardiology

## 2017-09-16 ENCOUNTER — Other Ambulatory Visit: Payer: Self-pay | Admitting: Cardiology

## 2017-09-21 ENCOUNTER — Ambulatory Visit (INDEPENDENT_AMBULATORY_CARE_PROVIDER_SITE_OTHER): Payer: Medicaid Other

## 2017-09-21 DIAGNOSIS — I6523 Occlusion and stenosis of bilateral carotid arteries: Secondary | ICD-10-CM

## 2017-09-22 LAB — VAS US CAROTID
LCCADSYS: -89 cm/s
LCCAPDIAS: 0 cm/s
LEFT ECA DIAS: 0 cm/s
LEFT VERTEBRAL DIAS: -13 cm/s
LICAPDIAS: 61 cm/s
Left CCA dist dias: -20 cm/s
Left CCA prox sys: 76 cm/s
Left ICA dist dias: -21 cm/s
Left ICA dist sys: -82 cm/s
Left ICA prox sys: 218 cm/s
RCCAPDIAS: 22 cm/s
RCCAPSYS: 95 cm/s
RIGHT ECA DIAS: 0 cm/s
RIGHT VERTEBRAL DIAS: -8 cm/s
Right cca dist sys: -62 cm/s

## 2017-10-04 ENCOUNTER — Other Ambulatory Visit: Payer: Self-pay | Admitting: Cardiology

## 2017-10-07 ENCOUNTER — Telehealth: Payer: Self-pay | Admitting: *Deleted

## 2017-10-07 NOTE — Telephone Encounter (Signed)
Notes recorded by Lesle ChrisHill, Tahni Porchia G, LPN on 1/6/10961/12/2017 at 10:21 AM EST Patient notified and verbalized understanding. Copy to pmd. ------  Notes recorded by Antoine PocheBranch, Jonathan F, MD on 10/06/2017 at 1:33 PM EST Carotid US shows moderate blockages on both sides, we will continue to monitor

## 2017-11-05 ENCOUNTER — Other Ambulatory Visit: Payer: Self-pay | Admitting: Cardiology

## 2017-11-22 ENCOUNTER — Other Ambulatory Visit: Payer: Self-pay | Admitting: Cardiology

## 2018-02-14 ENCOUNTER — Other Ambulatory Visit: Payer: Self-pay | Admitting: Cardiology

## 2018-03-15 ENCOUNTER — Other Ambulatory Visit: Payer: Self-pay | Admitting: Cardiology

## 2018-03-22 ENCOUNTER — Other Ambulatory Visit: Payer: Self-pay | Admitting: Cardiology

## 2018-03-28 ENCOUNTER — Other Ambulatory Visit: Payer: Self-pay | Admitting: *Deleted

## 2018-03-28 MED ORDER — PRASUGREL HCL 10 MG PO TABS: ORAL_TABLET | ORAL | 1 refills | Status: DC

## 2018-03-30 ENCOUNTER — Other Ambulatory Visit: Payer: Self-pay | Admitting: Cardiology

## 2018-04-20 ENCOUNTER — Other Ambulatory Visit: Payer: Self-pay | Admitting: Cardiology

## 2018-04-26 ENCOUNTER — Ambulatory Visit: Payer: Self-pay | Admitting: Cardiology

## 2018-04-26 NOTE — Progress Notes (Deleted)
Clinical Summary [name] is a 62 y.o.female seen today for follow up of the following medical problems.    1. CAD  - CABG 2005 4 vessel  last cath 05/2015 results as reported below, no targets for pci, recs for medical management. Was started on ranexa 534m bid, had headache on nitrates. She attributes that episode to severe stress, had lost a very close friend at that time.  - headaches on ranexa.  - no recent chest pain, no SOB/DOE - compliant with meds  2. Carotid stenosis  - Carotid [place] 40-59% bilateral - no recent neuro symptoms   3. HTN  - she is compliant with meds  4. Hyperlipidemia  - did not tolerate crestor, prava, or lovastatin due to side effects. - currently on zetia    Past Medical History:  Diagnosis Date  . [place] renal failure ([place]   . Anemia   . Anxiety   . Arthritis   . Asthma   . Carotid artery disease ([place]    a. Duplex 50-69% BICA 03/2014.  .Marland KitchenChronic back pain   . COPD (chronic obstructive pulmonary disease) ([place]   . Coronary artery disease    a. s/p prior CABG 2005. b. BMS 02/2009 to VG-PDA. c. NSTEMI 08/2010 s/p PTCA/DES to SVG-PDA in previously stented area. d. DES to SVG to RCA on 05/24/14  c. LHC 06/04/2015 w/ no sig change in her anatomy and Rx managment  . DDD (degenerative disc disease)   . Depression   . Diverticulitis    4 documented episodes by CT; most recent April 2012  . GERD (gastroesophageal reflux disease)   . History of blood transfusion   . HTN (hypertension)   . Hyperlipidemia   . Hypertension   . Hypothyroidism   . Ischemic cardiomyopathy    a. EF 40% in 2012 by cath. b. improved to 50-55% by cath 05/2014  [facility]  . Kidney stones   . Seizures ([place]   . Sinus bradycardia   . Sleep apnea    a. Mild-to-moderate obstructive sleep apnea diagnosed in April 2012 by sleep study.  . Tension headache      Allergies  Allergen Reactions  . Pneumococcal Vaccines Anxiety,  Palpitations and Cough  . Adhesive [Tape] Itching and Other (See Comments)    Blisters (pls use paper tape)  . Crestor [Rosuvastatin] Other (See Comments)    Muscle pain  . Lipitor [Atorvastatin] Other (See Comments)    Cramps  . Morphine And Related Itching    Pt states she takes with benadryl     Current Outpatient Medications  Medication Sig Dispense Refill  . albuterol (PROAIR HFA) 108 (90 BASE) MCG/ACT inhaler Inhale 2 puffs into the lungs every 6 (six) hours as needed for wheezing or shortness of breath.    .Marland Kitchenalbuterol (PROVENTIL) (2.5 MG/3ML) 0.083% nebulizer solution Take 2.5 mg by nebulization every 6 (six) hours as needed for wheezing or shortness of breath.    . ALPRAZolam (XANAX) 1 MG tablet Take 1 mg by mouth 4 (four) times daily as needed for anxiety or sleep.     .Marland KitchenamLODipine (NORVASC) 10 MG tablet TAKE ONE TABLET BY MOUTH ONCE DAILY. 30 tablet 6  . aspirin EC 81 MG tablet Take 81 mg daily by mouth.    . benazepril (LOTENSIN) 10 MG tablet TAKE (1) TABLET BY MOUTH EACH MORNING. 15 tablet 0  . benazepril (LOTENSIN) 10 MG tablet TAKE (1) TABLET  BY MOUTH EACH MORNING. 90 tablet 1  . benazepril (LOTENSIN) 10 MG tablet TAKE (1) TABLET BY MOUTH EACH MORNING. 90 tablet 1  . butalbital-acetaminophen-caffeine (FIORICET, ESGIC) 50-325-40 MG tablet Take 1 tablet by mouth every 4 (four) hours as needed for headache. 14 tablet 0  . ezetimibe (ZETIA) 10 MG tablet TAKE ONE TABLET BY MOUTH ONCE DAILY. 15 tablet 0  . fluticasone (FLONASE) 50 MCG/ACT nasal spray Place 2 sprays into both nostrils daily as needed for allergies or rhinitis.    . furosemide (LASIX) 80 MG tablet TAKE ONE TABLET DAILY. 30 tablet 3  . HYDROcodone-acetaminophen (NORCO) 10-325 MG per tablet Take 1 tablet by mouth every 4 (four) hours as needed for moderate pain (pain).     . isosorbide mononitrate (IMDUR) 30 MG 24 hr tablet TAKE 1 AND 1/2 TABLETS BY MOUTH ONCE DAILY. 45 tablet 0  . isosorbide mononitrate (IMDUR) 30 MG  24 hr tablet TAKE 1 AND 1/2 TABLETS BY MOUTH ONCE DAILY. 45 tablet 0  . isosorbide mononitrate (IMDUR) 30 MG 24 hr tablet TAKE 1 AND 1/2 TABLETS BY MOUTH ONCE DAILY. 45 tablet 6  . levETIRAcetam (KEPPRA) 250 MG tablet Take 250 mg by mouth 2 (two) times daily.    Marland Kitchen levothyroxine (SYNTHROID, LEVOTHROID) 75 MCG tablet Take 75 mcg by mouth daily before breakfast.     . loratadine (CLARITIN) 10 MG tablet Take 1 tablet (10 mg total) by mouth daily as needed for allergies or rhinitis. 30 tablet 0  . metoprolol succinate (TOPROL-XL) 50 MG 24 hr tablet TAKE (1) TABLET BY MOUTH EACH MORNING. TAKE WITH OR IMMEDIATELY FOLLOWING A MEAL. 30 tablet 6  . Multiple Vitamin (MULTIVITAMIN WITH MINERALS) TABS tablet Take 1 tablet by mouth 2 (two) times daily. 25 tablet 4  . nitroGLYCERIN (NITROSTAT) 0.4 MG SL tablet DISSOLVE 1 TABLET UNDER TONGUE EVERY 5 MINUTES UP TO 15 MIN FOR CHESTPAIN. IF NO RELIEF CALL 911. 25 tablet 3  . omega-3 acid ethyl esters (LOVAZA) 1 G capsule Take 2 capsules (2 g total) by mouth 2 (two) times daily. 60 capsule 11  . omeprazole (PRILOSEC) 20 MG capsule TAKE (1) CAPSULE BY MOUTH ONCE DAILY. 30 capsule 6  . potassium chloride SA (K-DUR,KLOR-CON) 20 MEQ tablet TAKE (1) TABLET BY MOUTH EACH MORNING. 30 tablet 3  . potassium chloride SA (K-DUR,KLOR-CON) 20 MEQ tablet TAKE (1) TABLET BY MOUTH EACH MORNING. 30 tablet 6  . prasugrel (EFFIENT) 10 MG TABS tablet TAKE (1) TABLET BY MOUTH EACH MORNING. 90 tablet 1   No current facility-administered medications for this visit.      Past Surgical History:  Procedure Laterality Date  . APPENDECTOMY  ~ 1970  . CARDIAC CATHETERIZATION  Jan 2012   patent stents in RCA graft; CAD stable  . CARDIAC CATHETERIZATION N/A 06/03/2015   Procedure: Left Heart Cath and Cors/Grafts Angiography;  Surgeon: Belva Crome, MD;  Location: [facility];  Service: Cardiovascular;  Laterality: N/A;  . CAROTID ENDARTERECTOMY  2005  . CARPAL TUNNEL WITH CUBITAL  TUNNEL Right 2001  . CORONARY ANGIOPLASTY WITH STENT PLACEMENT  2011   Promus Premier DES, 3.5 mm x 12 mm, for 99% proximal stent ISR in the SVG-RPDA   . CORONARY ANGIOPLASTY WITH STENT PLACEMENT  05/24/2014  . CORONARY ARTERY BYPASS GRAFT  2005   LIMA to AL, SVG to LAD, SVG to Cx, SVG-PDA  . KNEE ARTHROSCOPY W/ ACL RECONSTRUCTION Left 2001  . LAPAROSCOPIC CHOLECYSTECTOMY  2010  . LEFT  HEART CATHETERIZATION WITH CORONARY/GRAFT ANGIOGRAM N/A 05/24/2014   Procedure: LEFT HEART CATHETERIZATION WITH Beatrix Fetters;  Surgeon: Leonie Man, MD; EF 50-55%, LAD & D1 100%, LIMA-D1 OK, CFX OK, OM1 & OM2 100%, lat OM Forney Kleinpeter 100%, RCA 100%; pSVG-RPDA 99% rx w/ scoring balloon PTCA & 3.5 x 12 mm Promus DES; SVG-LAD, SVG-OM known occluded      . TUBAL LIGATION  ~ 1989  . VAGINAL HYSTERECTOMY  ~ 1993     Allergies  Allergen Reactions  . Pneumococcal Vaccines Anxiety, Palpitations and Cough  . Adhesive [Tape] Itching and Other (See Comments)    Blisters (pls use paper tape)  . Crestor [Rosuvastatin] Other (See Comments)    Muscle pain  . Lipitor [Atorvastatin] Other (See Comments)    Cramps  . Morphine And Related Itching    Pt states she takes with benadryl      Family History  Problem Relation Age of Onset  . Heart attack Mother   . Heart disease Mother   . Breast cancer Mother   . Heart attack Father   . Heart disease Father   . Colon cancer Neg Hx      Social History Ms. Consolo reports that she quit smoking about 2 years ago. Her smoking use included cigarettes. She started smoking about 25 years ago. She has a 10.50 pack-year smoking history. She has never used smokeless tobacco. Ms. Dobransky reports that she does not drink alcohol.   Review of Systems CONSTITUTIONAL: No weight loss, fever, chills, weakness or fatigue.  HEENT: Eyes: No visual loss, blurred vision, double vision or yellow sclerae.No hearing loss, sneezing, congestion, runny nose or sore throat.  SKIN: No  rash or itching.  CARDIOVASCULAR:  RESPIRATORY: No shortness of breath, cough or sputum.  GASTROINTESTINAL: No anorexia, nausea, vomiting or diarrhea. No abdominal pain or blood.  GENITOURINARY: No burning on urination, no polyuria NEUROLOGICAL: No headache, dizziness, syncope, paralysis, ataxia, numbness or tingling in the extremities. No change in bowel or bladder control.  MUSCULOSKELETAL: No muscle, back pain, joint pain or stiffness.  LYMPHATICS: No enlarged nodes. No history of splenectomy.  PSYCHIATRIC: No history of depression or anxiety.  ENDOCRINOLOGIC: No reports of sweating, cold or heat intolerance. No polyuria or polydipsia.  Marland Kitchen   Physical Examination There were no vitals filed for this visit. There were no vitals filed for this visit.  Gen: resting comfortably, no acute distress HEENT: no scleral icterus, pupils equal round and reactive, no palptable cervical adenopathy,  CV Resp: Clear to auscultation bilaterally GI: abdomen is soft, non-tender, non-distended, normal bowel sounds, no hepatosplenomegaly MSK: extremities are warm, no edema.  Skin: warm, no rash Neuro:  no focal deficits Psych: appropriate affect   Diagnostic Studies  Cath 2012 Left main coronary was normal.  Left anterior descending artery was occluded just after the septal  perforator. First diagonal Reisa Coppola was subtotally occluded. There was  some filling of the distal LAD and diagonals from collateral branches  from the LIMA. Circumflex coronary artery was occluded in the  midvessel. The AV groove Louellen Haldeman was patent. There was a small first  obtuse marginal Krystofer Hevener with 30-40% multiple lesions. There was a small  second obtuse marginal Elaine Roanhorse with 30-40% discrete lesions. There is a  very large obtuse marginal Eder Macek seen filling from collaterals from the  left internal mammary artery.  Right coronary artery was 100% occluded proximally with both left-to-  right and right-to-right  collaterals. The distal PDA also filled from  the  vein graft to the right coronary artery.  The left internal mammary artery was widely patent. It also filled the  distal right large obtuse marginal Toivo Bordon and filled retrograde to a  smaller diagonal Nashya Garlington.  There was known occlusion of the 2 left-sided vein grafts. The  saphenous vein graft to the right coronary artery was widely patent.  Bare-metal stent in the mid-to-distal vessel widely patent. There was  poor runoff to the vessel primarily being retrograde into the posterior  lateral Cathyrn Deas, however, there was no new focal stenosis.  RAO ventriculography. RAO ventriculography showed anterior apical and  mid inferior wall hypokinesis. EF was in the 40% range.  Right heart catheterization showed mean pulmonary capillary wedge  pressure of 24, PA pressure was 55/26, RV pressure was 52/5, RA pressure  mean was 16, aortic pressure was 155/76, LV pressure was equal to 167/16  with a post A-wave EDP of 26.  Cardiac output was 6.3 liters per minute with a cardiac index of 3  liters per minute per meter squared by Fick.   IMPRESSION: The patient's coronary artery disease is stable. The  stents in the RCA graft were patent. There are no new lesions that I  can see. She does have collateralized distal right and OM Tzippy Testerman.  Continued medical therapy is warranted. If she continues to have chest  pain, Ranexa may be in order since it is somewhat late in the day and  she had both venous and arterial sheath. She will be kept overnight and  discharged in the morning. She tolerated the procedure well.   03/2014 Echo Study Conclusions  - Left ventricle: The cavity size was normal. Wall thickness was increased in a pattern of mild LVH. Systolic function was normal. The estimated ejection fraction was in the range of 50% to 55%. There is akinesis of the basalinferolateral myocardium. Features are consistent with a  pseudonormal left ventricular filling pattern, with concomitant abnormal relaxation and increased filling pressure (grade 2 diastolic dysfunction). - Aortic valve: Mildly calcified annulus. Trileaflet. There was no significant regurgitation. - Mitral valve: There was trivial regurgitation. - Left atrium: The atrium was mildly to moderately dilated. - Right ventricle: Systolic function was mildly reduced. - Right atrium: Central venous pressure (est): 3 mm Hg. - Atrial septum: No defect or patent foramen ovale was identified. - Tricuspid valve: There was trivial regurgitation. - Pulmonary arteries: Systolic pressure could not be accurately estimated. - Pericardium, extracardiac: There was no pericardial effusion.  Impressions:  - Mild LVH with LVEF 50-55%, basal inferolateral hypokinesis, grade 2 diastolic dysfunction. Mild to moderate left atrial enlargement. Mildly reduced RV contraction. Unable to assess PASP.  03/2014 Carotid US IMPRESSION:  Bilateral 50-69% stenosis. The left internal carotid peak systolic  velocity has increased slightly in the interval from the prior exam.  05/2014 Cath FINDINGS: Hemodynamics: 1. Central Aortic Pressure / Mean: 110/62/81 mmHg 2. Left Ventricular Pressure / LVEDP: 110/14/21 mmHg  Left Ventriculography:  EF:50 at 55 %  Wall Motion:Severe hypokinesis of the Basal to MID inferior-inferolateraL wall with mild hypokinesis of the basal anterior wall  Coronary Anatomy:  Dominance: Right  Left Main: Normal caliber vessel that itself is free of significant disease. Bifurcates into the LAD and Circumflex DGU:[id number] caliber vessel that is 100% occluded after a septal perforator and small diagonal Modesty Rudy. There is a first diagonal Gerardine Peltz that is also half percent occluded after initial subtotal occlusion.  Left Circumflex:Moderate caliber vessel it gives rise to a proximal small caliber OM  Eligio Angert that is somewhat tortuous and  free of significant disease. The vessel then bifurcates into the introducer complex which is small in caliber and perfuses the basal inferolateral wall. There is a major lateral OM Wynter Isaacs the visualized the proximal segment is occluded. There is late retrograde filling of the remainder the nature OM which appears to have 2 branches distally. This is faint collaterals from the proximal OM.   RCA: 100% proximal occluded. SVG-RPDA: Large-caliber graft with mildly irregular is proximally, there are overlapping stents in the distal mid graft with 99% napkin ring proximal in-stent restenosis of the most proximal stent that extends just minimally to the unstented segment. The remainder of the graft is relatively free of disease and attached to the mid RPDA.  RPDA: Large-caliber tortuous vessel that reaches almost to the apex.  RPL Sysytem:The RPAV fills via retrograde flow in the RPDA. There are several small posterolateral branches that are relatively free of disease. The PAV is relatively free of disease.  After reviewing the initial angiography, the culprit lesion was thought to be the 99% proximal stent ISR in the SVG-RPDA. Preparation were made to proceed with PCI on this lesion. As the lesion is focal &appears to be fibrous in nature and too stenosed to safely pass a filter wire, the decision was made to pre-dilate &stent without distal protection. No evidence of a filling defect was noted besides the "napkin-ring" lesion.  Percutaneous Coronary Intervention: Sheath exchanged for 6 Fr Guide: 6 Fr AL1Guidewire: BMW (after failed attempt with Pro-Water Predilation Balloon: Angioscult Cutting Balloon 3.5 mm x 10 mm;  ? 12 Atm x 45 Sec,-- excellent predilation of the fibrous lesion at high pressure. Brisk TIMI-3 flow down stream but no sign of no reflow. The decision was made to proceed with stenting. Stent: Promus Premier DES 3.5 mm x 12 mm; overlaps roughly 4 mm into the original  stent. ? Deployed at: 20 Atm x 30 Sec Post-dilation Balloon: Shallowater Emerge 4.0 mm x 8 mm;  ? 14 Atm x 30 Sec x 2 -  ? Final Diameter: 4.1 mm  Post deployment angiography in multiple views, with and without guidewire in place revealed excellent stent deployment and lesion coverage. There was no evidence of dissection or perforation. Brisk TIMI-3 flow is noted in the downstream vessel  MEDICATIONS:  Anesthesia: Local Lidocaine 2 ml  Sedation: 4 mg IV Versed, 100 mcg IV fentanyl ;   Omnipaque Contrast: 180 ml  Anticoagulation: IV Heparin 13500 Units total   Anti-Platelet Agent: Effient and aspirin as standing home medication  PATIENT DISPOSITION:   The patient was transferred to the PACU holding area in a hemodynamicaly stable, chest pain free condition.  The patient tolerated the procedure well, and there were no complications. EBL: <5 ml  The patient was stable before, during, and after the procedure.  POST-OPERATIVE DIAGNOSIS:   Severe native CAD with 100% negative RCA, LAD & D1 along with OM 1 and OM 2 as previously described  Severe proximal edge in-stent restenosis of SVG-RPDA   Successful PCI of the proximal edge in-stent restenosis of SVG RCA with scoring balloon angioplasty followed by DES stent placement: Promus Premier 3.5 mm x 12 mm postdilated to 4.1 mm  Widely patent LIMA-diagonal with distal collaterals to what appears to be the apical LAD.  Known occlusion of the SVG-LAD and SVG-OM  Relatively preserved LVEF as noted on the cardiogram recently. There is known hypokinesis to akinesis of the nasal to mid inferior inferolateral wall as  well as mild hypokinesis of the basal anterior wall assistant with known CAD.  PLAN OF CARE:  Overnight observation post-PCI. Continue home doses of aspirin plus Effient.  Standard post radial cath care with care being removal   Discharge in the morning if stable.  Followup with Dr. Harl Bowie - continue to  optimize medical management of existing severe CAD.   05/2015 Cath                 Dominance: Co-dominant           Left Anterior Descending   . Mid LAD lesion, 100% stenosed.   . First Diagonal Levora Werden   The vessel is small in size.   . 1st Diag lesion, 99% stenosed.        Ramus Intermedius  The vessel is small .           Left Circumflex  The vessel is small .   Marland Kitchen Mid Cx to Dist Cx lesion, 100% stenosed.   . Second Obtuse Marginal Odis Wickey   The vessel is small in size.   . Third Obtuse Marginal Reeshemah Nazaryan   3rd Mrg filled by collaterals from 1st Mrg.           Right Coronary Artery   . Prox RCA to Mid RCA lesion, 100% stenosed.   . Dist RCA lesion, 100% stenosed.           Graft Angiography        Free Graft to Dist LAD  SVG   . Prox Graft to Mid Graft lesion, 100% stenosed.         Free Graft to Dist RCA  SVG   . Mid Graft lesion, 30% stenosed. The lesion was previously treated with a stent (unknown type) .         Free Graft to 2nd Mrg  SVG   . Origin to Prox Graft lesion, 100% stenosed.     Free LIMA Graft to 2nd Diag  LIMA        Cath RECOMMENDATIONS:   Continued aggressive risk factor modification  No anatomy amenable to percutaneous intervention.   09/2017 carotid US RICA 09-23%, LICA 30-07%     Assessment and Plan   1. CAD  - no recent symptoms -continue current meds  2. Carotid stenosis  - moderate by last [place] 08/2015 - recepeat carotid US  3. HTN  - at goal, continue current meds  4. Hyperlipidemia - has not tolerated statins. - continue zetia       Arnoldo Lenis,  M.D

## 2018-04-27 ENCOUNTER — Encounter: Payer: Self-pay | Admitting: Cardiology

## 2018-05-26 ENCOUNTER — Other Ambulatory Visit: Payer: Self-pay | Admitting: Cardiology

## 2018-06-07 ENCOUNTER — Ambulatory Visit (INDEPENDENT_AMBULATORY_CARE_PROVIDER_SITE_OTHER): Payer: Medicaid Other | Admitting: Cardiology

## 2018-06-07 ENCOUNTER — Encounter: Payer: Self-pay | Admitting: Cardiology

## 2018-06-07 ENCOUNTER — Encounter: Payer: Self-pay | Admitting: *Deleted

## 2018-06-07 VITALS — BP 146/74 | HR 56 | Ht 63.0 in | Wt 221.8 lb

## 2018-06-07 DIAGNOSIS — E782 Mixed hyperlipidemia: Secondary | ICD-10-CM

## 2018-06-07 DIAGNOSIS — I10 Essential (primary) hypertension: Secondary | ICD-10-CM

## 2018-06-07 DIAGNOSIS — R0789 Other chest pain: Secondary | ICD-10-CM

## 2018-06-07 DIAGNOSIS — I2510 Atherosclerotic heart disease of native coronary artery without angina pectoris: Secondary | ICD-10-CM | POA: Diagnosis not present

## 2018-06-07 DIAGNOSIS — I6523 Occlusion and stenosis of bilateral carotid arteries: Secondary | ICD-10-CM | POA: Diagnosis not present

## 2018-06-07 DIAGNOSIS — R079 Chest pain, unspecified: Secondary | ICD-10-CM

## 2018-06-07 MED ORDER — ISOSORBIDE MONONITRATE ER 30 MG PO TB24: 30.0000 mg | ORAL_TABLET | Freq: Every day | ORAL | 1 refills | Status: DC

## 2018-06-07 NOTE — Patient Instructions (Addendum)
Your physician recommends that you schedule a follow-up appointment in: 6 WEEKS WITH DR [facility]  Your physician has recommended you make the following change in your medication:   DECREASE IMDUR 30 MG DAILY   Please arrive at the [facility] main entrance of [facility] at       AM (30-45 minutes prior to test start time)  [facility] [address] [place], [place] [address] [phone]  Proceed to the [facility] Radiology Department (First Floor).  Please follow these instructions carefully (unless otherwise directed):  Hold all erectile dysfunction medications at least 48 hours prior to test.  On the Night Before the Test: . Drink plenty of water. . Do not consume any caffeinated/decaffeinated beverages or chocolate 12 hours prior to your test. . Do not take any antihistamines 12 hours prior to your test. . If you take Metformin do not take 24 hours prior to test.  On the Day of the Test: . Drink plenty of water. Do not drink any water within one hour of the test. . Do not eat any food 4 hours prior to the test. . You may take your regular medications prior to the test. . HOLD Furosemide morning of the test.  After the Test: . Drink plenty of water. . After receiving IV contrast, you may experience a mild flushed feeling. This is normal. . On occasion, you may experience a mild rash up to 24 hours after the test. This is not dangerous. If this occurs, you can take Benadryl 25 mg and increase your fluid intake. . If you experience trouble breathing, this can be serious. If it is severe call 911 IMMEDIATELY. If it is mild, please call our office. . If you take any of these medications: Glipizide/Metformin, Avandament, Glucavance, please do not take 48 hours after completing test.

## 2018-06-07 NOTE — Progress Notes (Signed)
Clinical Summary [name] is a 62 y.o.female seen today for follow up of the following medical problems.    1. CAD  - CABG 2005 4 vessel  last cath 05/2015 results as reported below, no targets for pci, recs for medical management. Was started on ranexa 558m bid, had headache on nitrates but recently on lower dose. . - headaches on ranexa.  - occasional chest pain since last visit. Variable in frequency and severity. Similar to prior pain. Increase pressure, more sharp than prior. Can be up to 10/10 in severity. Can be activity. Occurs about 1-2 times per week.   2. Carotid stenosis  - Carotid [place] 40-59% bilateral [id number] 430-16% LICA 401-09%  - no stroke like symptoms.    3. HTN  - compliant with meds, severe headache today likely increasing her bp  4. Hyperlipidemia  - did not tolerate crestor, prava, or lovastatin due to side effects. - remains on zetia    Past Medical History:  Diagnosis Date  . Acute renal failure ([place]   . Anemia   . Anxiety   . Arthritis   . Asthma   . Carotid artery disease ([place]    a. Duplex 50-69% BICA 03/2014.  .Marland KitchenChronic back pain   . COPD (chronic obstructive pulmonary disease) ([place]   . Coronary artery disease    a. s/p prior CABG 2005. b. BMS 02/2009 to VG-PDA. c. NSTEMI 08/2010 s/p PTCA/DES to SVG-PDA in previously stented area. d. DES to SVG to RCA on 05/24/14  c. LHC 06/04/2015 w/ no sig change in her anatomy and Rx managment  . DDD (degenerative disc disease)   . Depression   . Diverticulitis    4 documented episodes by CT; most recent April 2012  . GERD (gastroesophageal reflux disease)   . History of blood transfusion   . HTN (hypertension)   . Hyperlipidemia   . Hypertension   . Hypothyroidism   . Ischemic cardiomyopathy    a. EF 40% in 2012 by cath. b. improved to 50-55% by cath 05/2014  [facility]  . Kidney stones   . Seizures ([place]   . Sinus bradycardia   . Sleep apnea    a. Mild-to-moderate obstructive sleep apnea diagnosed in April 2012 by sleep study.  . Tension headache      Allergies  Allergen Reactions  . Pneumococcal Vaccines Anxiety, Palpitations and Cough  . Adhesive [Tape] Itching and Other (See Comments)    Blisters (pls use paper tape)  . Crestor [Rosuvastatin] Other (See Comments)    Muscle pain  . Lipitor [Atorvastatin] Other (See Comments)    Cramps  . Morphine And Related Itching    Pt states she takes with benadryl     Current Outpatient Medications  Medication Sig Dispense Refill  . albuterol (PROAIR HFA) 108 (90 BASE) MCG/ACT inhaler Inhale 2 puffs into the lungs every 6 (six) hours as needed for wheezing or shortness of breath.    .Marland Kitchenalbuterol (PROVENTIL) (2.5 MG/3ML) 0.083% nebulizer solution Take 2.5 mg by nebulization every 6 (six) hours as needed for wheezing or shortness of breath.    . ALPRAZolam (XANAX) 1 MG tablet Take 1 mg by mouth 4 (four) times daily as needed for anxiety or sleep.     .Marland KitchenamLODipine (NORVASC) 10 MG tablet TAKE ONE TABLET BY MOUTH ONCE DAILY. 30 tablet 6  . aspirin EC 81 MG tablet Take 81 mg daily by mouth.    .Marland Kitchen  benazepril (LOTENSIN) 10 MG tablet TAKE (1) TABLET BY MOUTH EACH MORNING. 15 tablet 0  . benazepril (LOTENSIN) 10 MG tablet TAKE (1) TABLET BY MOUTH EACH MORNING. 90 tablet 1  . benazepril (LOTENSIN) 10 MG tablet TAKE (1) TABLET BY MOUTH EACH MORNING. 90 tablet 1  . benazepril (LOTENSIN) 10 MG tablet TAKE (1) TABLET BY MOUTH EACH MORNING. 90 tablet 0  . butalbital-acetaminophen-caffeine (FIORICET, ESGIC) 50-325-40 MG tablet Take 1 tablet by mouth every 4 (four) hours as needed for headache. 14 tablet 0  . ezetimibe (ZETIA) 10 MG tablet TAKE ONE TABLET BY MOUTH ONCE DAILY. 15 tablet 0  . fluticasone (FLONASE) 50 MCG/ACT nasal spray Place 2 sprays into both nostrils daily as needed for allergies or rhinitis.    . furosemide (LASIX) 80 MG tablet TAKE ONE TABLET DAILY. 30 tablet 3  .  HYDROcodone-acetaminophen (NORCO) 10-325 MG per tablet Take 1 tablet by mouth every 4 (four) hours as needed for moderate pain (pain).     . isosorbide mononitrate (IMDUR) 30 MG 24 hr tablet TAKE 1 AND 1/2 TABLETS BY MOUTH ONCE DAILY. 45 tablet 0  . isosorbide mononitrate (IMDUR) 30 MG 24 hr tablet TAKE 1 AND 1/2 TABLETS BY MOUTH ONCE DAILY. 45 tablet 0  . isosorbide mononitrate (IMDUR) 30 MG 24 hr tablet TAKE 1 AND 1/2 TABLETS BY MOUTH ONCE DAILY. 45 tablet 6  . levETIRAcetam (KEPPRA) 250 MG tablet Take 250 mg by mouth 2 (two) times daily.    Marland Kitchen levothyroxine (SYNTHROID, LEVOTHROID) 75 MCG tablet Take 75 mcg by mouth daily before breakfast.     . loratadine (CLARITIN) 10 MG tablet Take 1 tablet (10 mg total) by mouth daily as needed for allergies or rhinitis. 30 tablet 0  . metoprolol succinate (TOPROL-XL) 50 MG 24 hr tablet TAKE (1) TABLET BY MOUTH EACH MORNING. TAKE WITH OR IMMEDIATELY FOLLOWING A MEAL. 30 tablet 6  . Multiple Vitamin (MULTIVITAMIN WITH MINERALS) TABS tablet Take 1 tablet by mouth 2 (two) times daily. 25 tablet 4  . nitroGLYCERIN (NITROSTAT) 0.4 MG SL tablet DISSOLVE 1 TABLET UNDER TONGUE EVERY 5 MINUTES UP TO 15 MIN FOR CHESTPAIN. IF NO RELIEF CALL 911. 25 tablet 3  . omega-3 acid ethyl esters (LOVAZA) 1 G capsule Take 2 capsules (2 g total) by mouth 2 (two) times daily. 60 capsule 11  . omeprazole (PRILOSEC) 20 MG capsule TAKE (1) CAPSULE BY MOUTH ONCE DAILY. 30 capsule 6  . potassium chloride SA (K-DUR,KLOR-CON) 20 MEQ tablet TAKE (1) TABLET BY MOUTH EACH MORNING. 30 tablet 3  . potassium chloride SA (K-DUR,KLOR-CON) 20 MEQ tablet TAKE (1) TABLET BY MOUTH EACH MORNING. 30 tablet 6  . prasugrel (EFFIENT) 10 MG TABS tablet TAKE (1) TABLET BY MOUTH EACH MORNING. 90 tablet 1   No current facility-administered medications for this visit.      Past Surgical History:  Procedure Laterality Date  . APPENDECTOMY  ~ 1970  . CARDIAC CATHETERIZATION  Jan 2012   patent stents in RCA  graft; CAD stable  . CARDIAC CATHETERIZATION N/A 06/03/2015   Procedure: Left Heart Cath and Cors/Grafts Angiography;  Surgeon: Belva Crome, MD;  Location: [facility];  Service: Cardiovascular;  Laterality: N/A;  . CAROTID ENDARTERECTOMY  2005  . CARPAL TUNNEL WITH CUBITAL TUNNEL Right 2001  . CORONARY ANGIOPLASTY WITH STENT PLACEMENT  2011   Promus Premier DES, 3.5 mm x 12 mm, for 99% proximal stent ISR in the SVG-RPDA   . CORONARY ANGIOPLASTY WITH  STENT PLACEMENT  05/24/2014  . CORONARY ARTERY BYPASS GRAFT  2005   LIMA to AL, SVG to LAD, SVG to Cx, SVG-PDA  . KNEE ARTHROSCOPY W/ ACL RECONSTRUCTION Left 2001  . LAPAROSCOPIC CHOLECYSTECTOMY  2010  . LEFT HEART CATHETERIZATION WITH CORONARY/GRAFT ANGIOGRAM N/A 05/24/2014   Procedure: LEFT HEART CATHETERIZATION WITH Beatrix Fetters;  Surgeon: Leonie Man, MD; EF 50-55%, LAD & D1 100%, LIMA-D1 OK, CFX OK, OM1 & OM2 100%, lat OM branch 100%, RCA 100%; pSVG-RPDA 99% rx w/ scoring balloon PTCA & 3.5 x 12 mm Promus DES; SVG-LAD, SVG-OM known occluded      . TUBAL LIGATION  ~ 1989  . VAGINAL HYSTERECTOMY  ~ 1993     Allergies  Allergen Reactions  . Pneumococcal Vaccines Anxiety, Palpitations and Cough  . Adhesive [Tape] Itching and Other (See Comments)    Blisters (pls use paper tape)  . Crestor [Rosuvastatin] Other (See Comments)    Muscle pain  . Lipitor [Atorvastatin] Other (See Comments)    Cramps  . Morphine And Related Itching    Pt states she takes with benadryl      Family History  Problem Relation Age of Onset  . Heart attack Mother   . Heart disease Mother   . Breast cancer Mother   . Heart attack Father   . Heart disease Father   . Colon cancer Neg Hx      Social History [name] reports that she quit smoking about 2 years ago. Her smoking use included cigarettes. She started smoking about 25 years ago. She has a 10.50 pack-year smoking history. She has never used smokeless tobacco. [name]  reports that she does not drink alcohol.   Review of Systems CONSTITUTIONAL: No weight loss, fever, chills, weakness or fatigue.  HEENT: Eyes: No visual loss, blurred vision, double vision or yellow sclerae.No hearing loss, sneezing, congestion, runny nose or sore throat.  SKIN: No rash or itching.  CARDIOVASCULAR: per hpi RESPIRATORY: No shortness of breath, cough or sputum.  GASTROINTESTINAL: No anorexia, nausea, vomiting or diarrhea. No abdominal pain or blood.  GENITOURINARY: No burning on urination, no polyuria NEUROLOGICAL: +headache MUSCULOSKELETAL: No muscle, back pain, joint pain or stiffness.  LYMPHATICS: No enlarged nodes. No history of splenectomy.  PSYCHIATRIC: No history of depression or anxiety.  ENDOCRINOLOGIC: No reports of sweating, cold or heat intolerance. No polyuria or polydipsia.  Marland Kitchen   Physical Examination Vitals:   06/07/18 0817 06/07/18 0832  BP: (!) 150/78 (!) 146/74  Pulse: (!) 56   SpO2: 97%    Vitals:   06/07/18 0817  Weight: 221 lb 12.8 oz (100.6 kg)  Height: '5\' 3"'  (1.6 m)    Gen: resting comfortably, no acute distress HEENT: no scleral icterus, pupils equal round and reactive, no palptable cervical adenopathy,  CV: RRR, no m/r/g, no jvd Resp: Clear to auscultation bilaterally GI: abdomen is soft, non-tender, non-distended, normal bowel sounds, no hepatosplenomegaly MSK: extremities are warm, no edema.  Skin: warm, no rash Neuro:  no focal deficits Psych: appropriate affect   Diagnostic Studies  Cath 2012 Left main coronary was normal.  Left anterior descending artery was occluded just after the septal  perforator. First diagonal branch was subtotally occluded. There was  some filling of the distal LAD and diagonals from collateral branches  from the LIMA. Circumflex coronary artery was occluded in the  midvessel. The AV groove branch was patent. There was a small first  obtuse marginal branch with 30-40% multiple lesions.  There  was a small  second obtuse marginal branch with 30-40% discrete lesions. There is a  very large obtuse marginal branch seen filling from collaterals from the  left internal mammary artery.  Right coronary artery was 100% occluded proximally with both left-to-  right and right-to-right collaterals. The distal PDA also filled from  the vein graft to the right coronary artery.  The left internal mammary artery was widely patent. It also filled the  distal right large obtuse marginal branch and filled retrograde to a  smaller diagonal branch.  There was known occlusion of the 2 left-sided vein grafts. The  saphenous vein graft to the right coronary artery was widely patent.  Bare-metal stent in the mid-to-distal vessel widely patent. There was  poor runoff to the vessel primarily being retrograde into the posterior  lateral branch, however, there was no new focal stenosis.  RAO ventriculography. RAO ventriculography showed anterior apical and  mid inferior wall hypokinesis. EF was in the 40% range.  Right heart catheterization showed mean pulmonary capillary wedge  pressure of 24, PA pressure was 55/26, RV pressure was 52/5, RA pressure  mean was 16, aortic pressure was 155/76, LV pressure was equal to 167/16  with a post A-wave EDP of 26.  Cardiac output was 6.3 liters per minute with a cardiac index of 3  liters per minute per meter squared by Fick.   IMPRESSION: The patient's coronary artery disease is stable. The  stents in the RCA graft were patent. There are no new lesions that I  can see. She does have collateralized distal right and OM branch.  Continued medical therapy is warranted. If she continues to have chest  pain, Ranexa may be in order since it is somewhat late in the day and  she had both venous and arterial sheath. She will be kept overnight and  discharged in the morning. She tolerated the procedure well.   03/2014 Echo Study  Conclusions  - Left ventricle: The cavity size was normal. Wall thickness was increased in a pattern of mild LVH. Systolic function was normal. The estimated ejection fraction was in the range of 50% to 55%. There is akinesis of the basalinferolateral myocardium. Features are consistent with a pseudonormal left ventricular filling pattern, with concomitant abnormal relaxation and increased filling pressure (grade 2 diastolic dysfunction). - Aortic valve: Mildly calcified annulus. Trileaflet. There was no significant regurgitation. - Mitral valve: There was trivial regurgitation. - Left atrium: The atrium was mildly to moderately dilated. - Right ventricle: Systolic function was mildly reduced. - Right atrium: Central venous pressure (est): 3 mm Hg. - Atrial septum: No defect or patent foramen ovale was identified. - Tricuspid valve: There was trivial regurgitation. - Pulmonary arteries: Systolic pressure could not be accurately estimated. - Pericardium, extracardiac: There was no pericardial effusion.  Impressions:  - Mild LVH with LVEF 50-55%, basal inferolateral hypokinesis, grade 2 diastolic dysfunction. Mild to moderate left atrial enlargement. Mildly reduced RV contraction. Unable to assess PASP.  03/2014 Carotid US IMPRESSION:  Bilateral 50-69% stenosis. The left internal carotid peak systolic  velocity has increased slightly in the interval from the prior exam.  05/2014 Cath FINDINGS: Hemodynamics: 1. Central Aortic Pressure / Mean: 110/62/81 mmHg 2. Left Ventricular Pressure / LVEDP: 110/14/21 mmHg  Left Ventriculography:  EF:50 at 55 %  Wall Motion:Severe hypokinesis of the Basal to MID inferior-inferolateraL wall with mild hypokinesis of the basal anterior wall  Coronary Anatomy:  Dominance: Right  Left Main: Normal caliber vessel  that itself is free of significant disease. Bifurcates into the LAD and Circumflex ALP:[id number] caliber vessel that  is 100% occluded after a septal perforator and small diagonal branch. There is a first diagonal branch that is also half percent occluded after initial subtotal occlusion.  Left Circumflex:Moderate caliber vessel it gives rise to a proximal small caliber OM branch that is somewhat tortuous and free of significant disease. The vessel then bifurcates into the introducer complex which is small in caliber and perfuses the basal inferolateral wall. There is a major lateral OM branch the visualized the proximal segment is occluded. There is late retrograde filling of the remainder the nature OM which appears to have 2 branches distally. This is faint collaterals from the proximal OM.   RCA: 100% proximal occluded. SVG-RPDA: Large-caliber graft with mildly irregular is proximally, there are overlapping stents in the distal mid graft with 99% napkin ring proximal in-stent restenosis of the most proximal stent that extends just minimally to the unstented segment. The remainder of the graft is relatively free of disease and attached to the mid RPDA.  RPDA: Large-caliber tortuous vessel that reaches almost to the apex.  RPL Sysytem:The RPAV fills via retrograde flow in the RPDA. There are several small posterolateral branches that are relatively free of disease. The PAV is relatively free of disease.  After reviewing the initial angiography, the culprit lesion was thought to be the 99% proximal stent ISR in the SVG-RPDA. Preparation were made to proceed with PCI on this lesion. As the lesion is focal &appears to be fibrous in nature and too stenosed to safely pass a filter wire, the decision was made to pre-dilate &stent without distal protection. No evidence of a filling defect was noted besides the "napkin-ring" lesion.  Percutaneous Coronary Intervention: Sheath exchanged for 6 Fr Guide: 6 Fr AL1Guidewire: BMW (after failed attempt with Pro-Water Predilation Balloon: Angioscult Cutting  Balloon 3.5 mm x 10 mm;  ? 12 Atm x 45 Sec,-- excellent predilation of the fibrous lesion at high pressure. Brisk TIMI-3 flow down stream but no sign of no reflow. The decision was made to proceed with stenting. Stent: Promus Premier DES 3.5 mm x 12 mm; overlaps roughly 4 mm into the original stent. ? Deployed at: 20 Atm x 30 Sec Post-dilation Balloon:  Emerge 4.0 mm x 8 mm;  ? 14 Atm x 30 Sec x 2 -  ? Final Diameter: 4.1 mm  Post deployment angiography in multiple views, with and without guidewire in place revealed excellent stent deployment and lesion coverage. There was no evidence of dissection or perforation. Brisk TIMI-3 flow is noted in the downstream vessel  MEDICATIONS:  Anesthesia: Local Lidocaine 2 ml  Sedation: 4 mg IV Versed, 100 mcg IV fentanyl ;   Omnipaque Contrast: 180 ml  Anticoagulation: IV Heparin 13500 Units total   Anti-Platelet Agent: Effient and aspirin as standing home medication  PATIENT DISPOSITION:   The patient was transferred to the PACU holding area in a hemodynamicaly stable, chest pain free condition.  The patient tolerated the procedure well, and there were no complications. EBL: <5 ml  The patient was stable before, during, and after the procedure.  POST-OPERATIVE DIAGNOSIS:   Severe native CAD with 100% negative RCA, LAD & D1 along with OM 1 and OM 2 as previously described  Severe proximal edge in-stent restenosis of SVG-RPDA   Successful PCI of the proximal edge in-stent restenosis of SVG RCA with scoring balloon angioplasty followed by DES stent placement: Promus  Premier 3.5 mm x 12 mm postdilated to 4.1 mm  Widely patent LIMA-diagonal with distal collaterals to what appears to be the apical LAD.  Known occlusion of the SVG-LAD and SVG-OM  Relatively preserved LVEF as noted on the cardiogram recently. There is known hypokinesis to akinesis of the nasal to mid inferior inferolateral wall as well as mild hypokinesis  of the basal anterior wall assistant with known CAD.  PLAN OF CARE:  Overnight observation post-PCI. Continue home doses of aspirin plus Effient.  Standard post radial cath care with care being removal   Discharge in the morning if stable.  Followup with Dr. Harl Bowie - continue to optimize medical management of existing severe CAD.   05/2015 Cath                 Dominance: Co-dominant           Left Anterior Descending   . Mid LAD lesion, 100% stenosed.   . First Diagonal Branch   The vessel is small in size.   . 1st Diag lesion, 99% stenosed.        Ramus Intermedius  The vessel is small .           Left Circumflex  The vessel is small .   Marland Kitchen Mid Cx to Dist Cx lesion, 100% stenosed.   . Second Obtuse Marginal Branch   The vessel is small in size.   . Third Obtuse Marginal Branch   3rd Mrg filled by collaterals from 1st Mrg.           Right Coronary Artery   . Prox RCA to Mid RCA lesion, 100% stenosed.   . Dist RCA lesion, 100% stenosed.           Graft Angiography        Free Graft to Dist LAD  SVG   . Prox Graft to Mid Graft lesion, 100% stenosed.         Free Graft to Dist RCA  SVG   . Mid Graft lesion, 30% stenosed. The lesion was previously treated with a stent (unknown type) .         Free Graft to 2nd Mrg  SVG   . Origin to Prox Graft lesion, 100% stenosed.     Free LIMA Graft to 2nd Diag  LIMA        Cath RECOMMENDATIONS:   Continued aggressive risk factor modification  No anatomy amenable to percutaneous intervention.    09/2017 carotid US  Final Interpretation: Right Carotid: There is  evidence in the right ICA of a 40-59% stenosis. Stable        40-59% RICA stenosis.  Left Carotid: There is evidence in the left ICA of a 40-59% stenosis. Stable       28-76% LICA stenosis.  Vertebrals: Both vertebral arteries were patent with antegrade flow. Subclavians: Normal flow hemodynamics were seen in bilateral subclavian       arteries.    Assessment and Plan  1. CAD  - intermittent chest pain for some time, cath in 2016 without PCI targets - antianginal therapy limited. She is on max dose norvasc. Lower heart rates, would not increase beta blocker. Imdur may be contributing to headaches, lower dose to 57m daily. Prior headaches on ranexa - will plan for coronary CTA to reevaluate her coronary anatomy.   2. Carotid stenosis  - moderate by last [place] - continue medical therapy and monitoring.   3. HTN  - elevated  today however ongoing severe headache - continue current meds  4. Hyperlipidemia - has not tolerated statins. - she will continue zetia, request pcp labs.    F/u 6 weeks    Arnoldo Lenis, M.D.

## 2018-06-20 ENCOUNTER — Emergency Department (HOSPITAL_COMMUNITY): Payer: Medicaid Other

## 2018-06-20 ENCOUNTER — Observation Stay (HOSPITAL_COMMUNITY)
Admission: EM | Admit: 2018-06-20 | Discharge: 2018-06-22 | Disposition: A | Discharge status: Home / Self Care | Payer: Medicaid Other | Attending: Pulmonary Disease | Admitting: Pulmonary Disease

## 2018-06-20 ENCOUNTER — Other Ambulatory Visit: Payer: Self-pay

## 2018-06-20 ENCOUNTER — Encounter (HOSPITAL_COMMUNITY): Payer: Self-pay | Admitting: Emergency Medicine

## 2018-06-20 DIAGNOSIS — Z9861 Coronary angioplasty status: Secondary | ICD-10-CM

## 2018-06-20 DIAGNOSIS — Z951 Presence of aortocoronary bypass graft: Secondary | ICD-10-CM

## 2018-06-20 DIAGNOSIS — J449 Chronic obstructive pulmonary disease, unspecified: Secondary | ICD-10-CM | POA: Diagnosis present

## 2018-06-20 DIAGNOSIS — R079 Chest pain, unspecified: Secondary | ICD-10-CM | POA: Diagnosis not present

## 2018-06-20 DIAGNOSIS — I739 Peripheral vascular disease, unspecified: Secondary | ICD-10-CM | POA: Diagnosis present

## 2018-06-20 DIAGNOSIS — I10 Essential (primary) hypertension: Secondary | ICD-10-CM | POA: Diagnosis not present

## 2018-06-20 DIAGNOSIS — J45909 Unspecified asthma, uncomplicated: Secondary | ICD-10-CM | POA: Diagnosis not present

## 2018-06-20 DIAGNOSIS — Z79899 Other long term (current) drug therapy: Secondary | ICD-10-CM | POA: Diagnosis not present

## 2018-06-20 DIAGNOSIS — F419 Anxiety disorder, unspecified: Secondary | ICD-10-CM | POA: Insufficient documentation

## 2018-06-20 DIAGNOSIS — F329 Major depressive disorder, single episode, unspecified: Secondary | ICD-10-CM | POA: Diagnosis not present

## 2018-06-20 DIAGNOSIS — Z7982 Long term (current) use of aspirin: Secondary | ICD-10-CM | POA: Diagnosis not present

## 2018-06-20 DIAGNOSIS — E785 Hyperlipidemia, unspecified: Secondary | ICD-10-CM | POA: Diagnosis not present

## 2018-06-20 DIAGNOSIS — Z87891 Personal history of nicotine dependence: Secondary | ICD-10-CM | POA: Insufficient documentation

## 2018-06-20 DIAGNOSIS — E039 Hypothyroidism, unspecified: Secondary | ICD-10-CM | POA: Diagnosis present

## 2018-06-20 DIAGNOSIS — R0789 Other chest pain: Secondary | ICD-10-CM | POA: Diagnosis not present

## 2018-06-20 DIAGNOSIS — I2510 Atherosclerotic heart disease of native coronary artery without angina pectoris: Secondary | ICD-10-CM

## 2018-06-20 HISTORY — DX: Systemic involvement of connective tissue, unspecified: M35.9

## 2018-06-20 LAB — TROPONIN I
Troponin I: <0.03 ng/mL (ref <0.03)
Troponin I: <0.03 ng/mL (ref <0.03)
Troponin I: <0.03 ng/mL (ref <0.03)

## 2018-06-20 LAB — BASIC METABOLIC PANEL
Anion gap: 7 (ref 5–15)
BUN: 9 mg/dL (ref 8–23)
CALCIUM: 8.9 mg/dL (ref 8.9–10.3)
CO2: 24 mmol/L (ref 22–32)
CREATININE: 0.68 mg/dL (ref 0.44–1.00)
Chloride: 105 mmol/L (ref 98–111)
Glucose, Bld: 95 mg/dL (ref 70–99)
Potassium: 3.6 mmol/L (ref 3.5–5.1)
SODIUM: 136 mmol/L (ref 135–145)

## 2018-06-20 LAB — CBC
HEMATOCRIT: 35 % — AB (ref 36.0–46.0)
Hemoglobin: 11.4 g/dL — ABNORMAL LOW (ref 12.0–15.0)
MCH: 30.2 pg (ref 26.0–34.0)
MCHC: 32.6 g/dL (ref 30.0–36.0)
MCV: 92.6 fL (ref 78.0–100.0)
Platelets: 332 10*3/uL (ref 150–400)
RBC: 3.78 MIL/uL — ABNORMAL LOW (ref 3.87–5.11)
RDW: 12.9 % (ref 11.5–15.5)
WBC: 6.1 10*3/uL (ref 4.0–10.5)

## 2018-06-20 LAB — HEPARIN LEVEL (UNFRACTIONATED): Heparin Unfractionated: 0.35 IU/mL (ref 0.30–0.70)

## 2018-06-20 MED ORDER — ISOSORBIDE MONONITRATE ER 60 MG PO TB24
30.0000 mg | ORAL_TABLET | Freq: Every day | ORAL | Status: DC
  Administered 2018-06-21 – 2018-06-22 (×2): 30 mg via ORAL
  Filled 2018-06-20 (×2): qty 1

## 2018-06-20 MED ORDER — ASPIRIN EC 81 MG PO TBEC
81.0000 mg | DELAYED_RELEASE_TABLET | Freq: Every day | ORAL | Status: DC
  Administered 2018-06-21 – 2018-06-22 (×2): 81 mg via ORAL
  Filled 2018-06-20 (×2): qty 1

## 2018-06-20 MED ORDER — ASPIRIN 81 MG PO CHEW
324.0000 mg | CHEWABLE_TABLET | Freq: Once | ORAL | Status: AC
  Administered 2018-06-20: 324 mg via ORAL
  Filled 2018-06-20: qty 4

## 2018-06-20 MED ORDER — LEVOTHYROXINE SODIUM 75 MCG PO TABS
75.0000 ug | ORAL_TABLET | Freq: Every day | ORAL | Status: DC
  Administered 2018-06-21 – 2018-06-22 (×2): 75 ug via ORAL
  Filled 2018-06-20 (×2): qty 1

## 2018-06-20 MED ORDER — ONDANSETRON HCL 4 MG/2ML IJ SOLN
4.0000 mg | Freq: Four times a day (QID) | INTRAMUSCULAR | Status: DC | PRN
  Administered 2018-06-21: 4 mg via INTRAVENOUS
  Filled 2018-06-20: qty 2

## 2018-06-20 MED ORDER — DIPHENHYDRAMINE HCL 12.5 MG/5ML PO ELIX
12.5000 mg | ORAL_SOLUTION | Freq: Four times a day (QID) | ORAL | Status: DC | PRN
  Filled 2018-06-20: qty 5

## 2018-06-20 MED ORDER — ACETAMINOPHEN 325 MG PO TABS
650.0000 mg | ORAL_TABLET | Freq: Four times a day (QID) | ORAL | Status: DC | PRN
  Administered 2018-06-22: 650 mg via ORAL
  Filled 2018-06-20: qty 2

## 2018-06-20 MED ORDER — HEPARIN (PORCINE) IN NACL 100-0.45 UNIT/ML-% IJ SOLN
1200.0000 [IU]/h | INTRAMUSCULAR | Status: DC
  Administered 2018-06-20: 900 [IU]/h via INTRAVENOUS
  Administered 2018-06-21: 1050 [IU]/h via INTRAVENOUS
  Filled 2018-06-20 (×2): qty 250

## 2018-06-20 MED ORDER — METOPROLOL SUCCINATE ER 50 MG PO TB24
50.0000 mg | ORAL_TABLET | Freq: Every morning | ORAL | Status: DC
  Administered 2018-06-21: 50 mg via ORAL
  Filled 2018-06-20 (×2): qty 1

## 2018-06-20 MED ORDER — MORPHINE SULFATE (PF) 2 MG/ML IV SOLN
2.0000 mg | INTRAVENOUS | Status: DC | PRN
  Administered 2018-06-20 – 2018-06-21 (×2): 2 mg via INTRAVENOUS
  Filled 2018-06-20 (×2): qty 1

## 2018-06-20 MED ORDER — ALBUTEROL SULFATE (2.5 MG/3ML) 0.083% IN NEBU: 2.5000 mg | INHALATION_SOLUTION | Freq: Four times a day (QID) | RESPIRATORY_TRACT | Status: DC | PRN

## 2018-06-20 MED ORDER — DIPHENHYDRAMINE HCL 50 MG/ML IJ SOLN
12.5000 mg | INTRAMUSCULAR | Status: AC
  Administered 2018-06-20: 12.5 mg via INTRAVENOUS
  Filled 2018-06-20: qty 1

## 2018-06-20 MED ORDER — LEVETIRACETAM 250 MG PO TABS
250.0000 mg | ORAL_TABLET | Freq: Two times a day (BID) | ORAL | Status: DC
  Administered 2018-06-20 – 2018-06-22 (×4): 250 mg via ORAL
  Filled 2018-06-20 (×4): qty 1

## 2018-06-20 MED ORDER — AMLODIPINE BESYLATE 5 MG PO TABS
10.0000 mg | ORAL_TABLET | Freq: Every day | ORAL | Status: DC
  Administered 2018-06-21 – 2018-06-22 (×2): 10 mg via ORAL
  Filled 2018-06-20 (×2): qty 2

## 2018-06-20 MED ORDER — MORPHINE SULFATE (PF) 2 MG/ML IV SOLN
2.0000 mg | INTRAVENOUS | Status: AC
  Administered 2018-06-20: 2 mg via INTRAVENOUS
  Filled 2018-06-20: qty 1

## 2018-06-20 MED ORDER — HEPARIN BOLUS VIA INFUSION
4000.0000 [IU] | Freq: Once | INTRAVENOUS | Status: AC
  Administered 2018-06-20: 4000 [IU] via INTRAVENOUS

## 2018-06-20 MED ORDER — POLYETHYLENE GLYCOL 3350 17 G PO PACK: 17.0000 g | PACK | Freq: Every day | ORAL | Status: DC | PRN

## 2018-06-20 MED ORDER — NITROGLYCERIN 2 % TD OINT
1.0000 [in_us] | TOPICAL_OINTMENT | Freq: Four times a day (QID) | TRANSDERMAL | Status: DC
  Administered 2018-06-20 – 2018-06-22 (×7): 1 [in_us] via TOPICAL
  Filled 2018-06-20 (×7): qty 1

## 2018-06-20 MED ORDER — ACETAMINOPHEN 650 MG RE SUPP: 650.0000 mg | Freq: Four times a day (QID) | RECTAL | Status: DC | PRN

## 2018-06-20 MED ORDER — FENTANYL CITRATE (PF) 100 MCG/2ML IJ SOLN
50.0000 ug | Freq: Once | INTRAMUSCULAR | Status: AC
  Administered 2018-06-20: 50 ug via INTRAVENOUS
  Filled 2018-06-20: qty 2

## 2018-06-20 MED ORDER — ALPRAZOLAM 0.5 MG PO TABS
0.5000 mg | ORAL_TABLET | Freq: Four times a day (QID) | ORAL | Status: DC
  Administered 2018-06-20 – 2018-06-22 (×7): 0.5 mg via ORAL
  Filled 2018-06-20 (×8): qty 1

## 2018-06-20 MED ORDER — EZETIMIBE 10 MG PO TABS
10.0000 mg | ORAL_TABLET | Freq: Every day | ORAL | Status: DC
  Administered 2018-06-21 – 2018-06-22 (×2): 10 mg via ORAL
  Filled 2018-06-20 (×2): qty 1

## 2018-06-20 MED ORDER — BENAZEPRIL HCL 10 MG PO TABS
10.0000 mg | ORAL_TABLET | Freq: Every morning | ORAL | Status: DC
  Administered 2018-06-21 – 2018-06-22 (×2): 10 mg via ORAL
  Filled 2018-06-20 (×2): qty 1

## 2018-06-20 MED ORDER — ONDANSETRON HCL 4 MG PO TABS: 4.0000 mg | ORAL_TABLET | Freq: Four times a day (QID) | ORAL | Status: DC | PRN

## 2018-06-20 NOTE — ED Notes (Signed)
EKG given to Dr. Knapp. 

## 2018-06-20 NOTE — Progress Notes (Signed)
ANTICOAGULATION CONSULT NOTE - Initial Consult  Pharmacy Consult for heparin gtt  Indication: chest pain/ACS  Allergies  Allergen Reactions  . Pneumococcal Vaccines Anxiety, Palpitations and Cough  . Adhesive [Tape] Itching and Other (See Comments)    Blisters (pls use paper tape)  . Crestor [Rosuvastatin] Other (See Comments)    Muscle pain  . Lipitor [Atorvastatin] Other (See Comments)    Cramps  . Morphine And Related Itching    Pt states she takes with benadryl    Patient Measurements:   Heparin Dosing Weight:     Vital Signs: Temp: 97.7 F (36.5 C) (09/16 1355) Temp Source: Oral (09/16 1355) BP: 150/82 (09/16 1730) Pulse Rate: 55 (09/16 1730)  Labs: Recent Labs    06/20/18 1418  HGB 11.4*  HCT 35.0*  PLT 332  CREATININE 0.68  TROPONINI <0.03    Estimated Creatinine Clearance: 82.5 mL/min (by C-G formula based on SCr of 0.68 mg/dL).   Medical History: Past Medical History:  Diagnosis Date  . Acute renal failure (HCC)   . Anemia   . Anxiety   . Arthritis   . Asthma   . Carotid artery disease (HCC)    a. Duplex 50-69% BICA 03/2014.  Marland Kitchen. Chronic back pain   . COPD (chronic obstructive pulmonary disease) (HCC)   . Coronary artery disease    a. s/p prior CABG 2005. b. BMS 02/2009 to VG-PDA. c. NSTEMI 08/2010 s/p PTCA/DES to SVG-PDA in previously stented area. d. DES to SVG to RCA on 05/24/14  c. LHC 06/04/2015 w/ no sig change in her anatomy and Rx managment  . DDD (degenerative disc disease)   . Depression   . Diverticulitis    4 documented episodes by CT; most recent April 2012  . GERD (gastroesophageal reflux disease)   . History of blood transfusion   . HTN (hypertension)   . Hyperlipidemia   . Hypertension   . Hypothyroidism   . Ischemic cardiomyopathy    a. EF 40% in 2012 by cath. b. improved to 50-55% by cath 05/2014  [facility]  . Kidney stones   . Seizures (HCC)   . Sinus bradycardia   . Sleep apnea    a. Mild-to-moderate  obstructive sleep apnea diagnosed in April 2012 by sleep study.  . Tension headache     Medications:   (Not in a hospital admission) Scheduled:  . heparin  4,000 Units Intravenous Once  . nitroGLYCERIN  1 inch Topical Q6H   Infusions:  . heparin     PRN:  Anti-infectives (From admission, onward)   None      Assessment: [name] a 62 y.o. female requires anticoagulation with a heparin iv infusion for the indication of  chest pain/ACS. Heparin gtt will be started following pharmacy protocol per pharmacy consult. Patient is not on previous oral anticoagulant that will require aPTT/HL correlation before transitioning to only HL monitoring.   Goal of Therapy:  Heparin level 0.3-0.7 units/ml Monitor platelets by anticoagulation protocol: Yes   Plan:  Give 4000 units bolus x 1 Start heparin infusion at 900 units/hr Check anti-Xa level in 6 hours and daily while on heparin Continue to monitor H&H and platelets  Heparin level to be drawn in 6 hours. 8 hours for patients >190 years old or crcl < 6330ml/min  [name] 06/20/2018,5:50 PM

## 2018-06-20 NOTE — ED Provider Notes (Signed)
[facility] EMERGENCY DEPARTMENT Provider Note   CSN: [id number] Arrival date & time: 06/20/18  1346     History   Chief Complaint Chief Complaint  Patient presents with  . Chest Pain    HPI [name] is a 62 y.o. female.  HPI Presents to the emergency room for evaluation of chest pain.  She states she started having chest pain off and on about a week ago.  The symptoms are not very severe.  Patient states this morning she started having severe sharp pain in the left side of her chest that radiated to her back and shoulder.  She tried taking some nitroglycerin with some temporary relief but the pain returned.  Patient also took some of her Xanax earlier today.  She feels lightheaded and dizzy.  Patient does have a history of coronary artery disease.  He has a history of CABG as well as stents post CABG. Past Medical History:  Diagnosis Date  . Acute renal failure (HCC)   . Anemia   . Anxiety   . Arthritis   . Asthma   . Carotid artery disease (HCC)    a. Duplex 50-69% BICA 03/2014.  Marland Kitchen Chronic back pain   . COPD (chronic obstructive pulmonary disease) (HCC)   . Coronary artery disease    a. s/p prior CABG 2005. b. BMS 02/2009 to VG-PDA. c. NSTEMI 08/2010 s/p PTCA/DES to SVG-PDA in previously stented area. d. DES to SVG to RCA on 05/24/14  c. LHC 06/04/2015 w/ no sig change in her anatomy and Rx managment  . DDD (degenerative disc disease)   . Depression   . Diverticulitis    4 documented episodes by CT; most recent April 2012  . GERD (gastroesophageal reflux disease)   . History of blood transfusion   . HTN (hypertension)   . Hyperlipidemia   . Hypertension   . Hypothyroidism   . Ischemic cardiomyopathy    a. EF 40% in 2012 by cath. b. improved to 50-55% by cath 05/2014  c. EF 35-40% by [facility] 05/2015  . Kidney stones   . Seizures (HCC)   . Sinus bradycardia   . Sleep apnea    a. Mild-to-moderate obstructive sleep apnea diagnosed in April 2012 by sleep study.  . Tension  headache     Patient Active Problem List   Diagnosis Date Noted  . Convulsions (HCC) 04/15/2016  . Angina pectoris (HCC) 04/13/2016  . Chest pain 04/13/2016  . Erroneous encounter - disregard 09/17/2015  . Hypothyroidism   . Ischemic cardiomyopathy   . CAD S/P multiple PCI after CABG 06/04/2015  . Obesity-BMI 41 06/04/2015  . Unstable angina (HCC) 06/03/2015  . 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 LLQ abdominal pain 12/02/2011  . Epigastric pain 04/19/2011  . Anemia 04/19/2011  . Diverticulitis 01/15/2011  . DIVERTICULITIS OF COLON 07/14/2010  . DIARRHEA, CHRONIC 07/14/2010  . Hx of CABG 2005 01/23/2010  . HEADACHE 01/23/2010  . Hyperlipidemia with target LDL less than 70 05/27/2009  . Essential hypertension 05/27/2009  . PVD- 50-69% bilat ICA  05/27/2009  . Chronic obstructive pulmonary disease (COPD) (HCC) 05/27/2009  . GERD 05/27/2009  . SEIZURE DISORDER 05/27/2009    Past Surgical History:  Procedure Laterality Date  . APPENDECTOMY  ~ 1970  . CARDIAC CATHETERIZATION  Jan 2012   patent stents in RCA graft; CAD stable  . CARDIAC CATHETERIZATION N/A 06/03/2015   Procedure: Left Heart Cath and Cors/Grafts Angiography;  Surgeon:  Lyn RecordsHenry W Smith, MD;  Location: [facility] INVASIVE CV LAB;  Service: Cardiovascular;  Laterality: N/A;  . CAROTID ENDARTERECTOMY  2005  . CARPAL TUNNEL WITH CUBITAL TUNNEL Right 2001  . CORONARY ANGIOPLASTY WITH STENT PLACEMENT  2011   Promus Premier DES, 3.5 mm x 12 mm, for 99% proximal stent ISR in the SVG-RPDA   . CORONARY ANGIOPLASTY WITH STENT PLACEMENT  05/24/2014  . CORONARY ARTERY BYPASS GRAFT  2005   LIMA to AL, SVG to LAD, SVG to Cx, SVG-PDA  . KNEE ARTHROSCOPY W/ ACL RECONSTRUCTION Left 2001  . LAPAROSCOPIC CHOLECYSTECTOMY  2010  . LEFT HEART CATHETERIZATION WITH CORONARY/GRAFT ANGIOGRAM N/A 05/24/2014   Procedure: LEFT HEART CATHETERIZATION WITH Isabel CapriceORONARY/GRAFT ANGIOGRAM;  Surgeon: Marykay Lexavid W Harding, MD; EF 50-55%,  LAD & D1 100%, LIMA-D1 OK, CFX OK, OM1 & OM2 100%, lat OM branch 100%, RCA 100%; pSVG-RPDA 99% rx w/ scoring balloon PTCA & 3.5 x 12 mm Promus DES; SVG-LAD, SVG-OM known occluded      . TUBAL LIGATION  ~ 1989  . VAGINAL HYSTERECTOMY  ~ 1993     OB History   None      Home Medications    Prior to Admission medications   Medication Sig Start Date End Date Taking? Authorizing Provider  albuterol (PROAIR HFA) 108 (90 BASE) MCG/ACT inhaler Inhale 2 puffs into the lungs every 6 (six) hours as needed for wheezing or shortness of breath.   Yes [provider]  albuterol (PROVENTIL) (2.5 MG/3ML) 0.083% nebulizer solution Take 2.5 mg by nebulization every 6 (six) hours as needed for wheezing or shortness of breath.   Yes [provider]  ALPRAZolam Prudy Feeler(XANAX) 0.5 MG tablet Take 0.5 mg by mouth 4 (four) times daily.   Yes [provider]  amLODipine (NORVASC) 10 MG tablet TAKE ONE TABLET BY MOUTH ONCE DAILY. Patient taking differently: Take 10 mg by mouth daily.  04/20/18  Yes BranchDorothe Pea, Jonathan F, MD  aspirin EC 81 MG tablet Take 81 mg daily by mouth.   Yes [provider]  benazepril (LOTENSIN) 10 MG tablet TAKE (1) TABLET BY MOUTH EACH MORNING. Patient taking differently: Take 10 mg by mouth every morning.  05/26/18  Yes Branch, Dorothe PeaJonathan F, MD  butalbital-acetaminophen-caffeine (FIORICET, ESGIC) 418 140 549250-325-40 MG tablet Take 1 tablet by mouth every 4 (four) hours as needed for headache. 04/15/16  Yes Kari BaarsHawkins, Edward, MD  ezetimibe (ZETIA) 10 MG tablet TAKE ONE TABLET BY MOUTH ONCE DAILY. Patient taking differently: Take 10 mg by mouth daily.  10/29/16  Yes BranchDorothe Pea, Jonathan F, MD  fluticasone (FLONASE) 50 MCG/ACT nasal spray Place 2 sprays into both nostrils daily as needed for allergies or rhinitis.   Yes [provider]  furosemide (LASIX) 80 MG tablet TAKE ONE TABLET DAILY. Patient taking differently: Take 80 mg by mouth daily.  11/05/17  Yes Branch, Dorothe PeaJonathan F, MD    HYDROcodone-acetaminophen (NORCO) 10-325 MG per tablet Take 1 tablet by mouth every 4 (four) hours as needed for moderate pain (pain).    Yes [provider]  isosorbide mononitrate (IMDUR) 30 MG 24 hr tablet Take 1 tablet (30 mg total) by mouth daily. 06/07/18  Yes Antoine PocheBranch, Jonathan F, MD  levETIRAcetam (KEPPRA) 250 MG tablet Take 250 mg by mouth 2 (two) times daily.   Yes [provider]  levothyroxine (SYNTHROID, LEVOTHROID) 75 MCG tablet Take 75 mcg by mouth daily before breakfast.    Yes [provider]  loratadine (CLARITIN) 10 MG tablet Take 1 tablet (  10 mg total) by mouth daily as needed for allergies or rhinitis. 05/24/17  Yes Loren Racer, MD  metoprolol succinate (TOPROL-XL) 50 MG 24 hr tablet TAKE (1) TABLET BY MOUTH EACH MORNING. TAKE WITH OR IMMEDIATELY FOLLOWING A MEAL. Patient taking differently: Take 50 mg by mouth every morning.  04/20/18  Yes BranchDorothe Pea, MD  Multiple Vitamin (MULTIVITAMIN WITH MINERALS) TABS tablet Take 1 tablet by mouth 2 (two) times daily. 08/28/13  Yes Branch, Dorothe Pea, MD  nitroGLYCERIN (NITROSTAT) 0.4 MG SL tablet DISSOLVE 1 TABLET UNDER TONGUE EVERY 5 MINUTES UP TO 15 MIN FOR CHESTPAIN. IF NO RELIEF CALL 911. Patient taking differently: Place 0.4 mg under the tongue every 5 (five) minutes as needed for chest pain.  08/24/16  Yes BranchDorothe Pea, MD  omega-3 acid ethyl esters (LOVAZA) 1 G capsule Take 2 capsules (2 g total) by mouth 2 (two) times daily. 06/05/15  Yes Janetta Hora, PA-C  omeprazole (PRILOSEC) 20 MG capsule TAKE (1) CAPSULE BY MOUTH ONCE DAILY. Patient taking differently: Take 20 mg by mouth every morning.  04/20/18  Yes Branch, Dorothe Pea, MD  potassium chloride SA (K-DUR,KLOR-CON) 20 MEQ tablet TAKE (1) TABLET BY MOUTH EACH MORNING. Patient taking differently: Take 20 mEq by mouth daily.  10/04/17  Yes Antoine Poche, MD  prasugrel (EFFIENT) 10 MG TABS tablet TAKE (1) TABLET BY MOUTH EACH  MORNING. Patient taking differently: Take 10 mg by mouth every morning.  03/28/18  Yes Branch, Dorothe Pea, MD    Family History Family History  Problem Relation Age of Onset  . Heart attack Mother   . Heart disease Mother   . Breast cancer Mother   . Heart attack Father   . Heart disease Father   . Colon cancer Neg Hx     Social History Social History   Tobacco Use  . Smoking status: Former Smoker    Packs/day: 0.50    Years: 21.00    Pack years: 10.50    Types: Cigarettes    Start date: 03/19/1993    Last attempt to quit: 07/29/2015    Years since quitting: 2.8  . Smokeless tobacco: Never Used  . Tobacco comment: smokes some time/go weeks without smoking  Substance Use Topics  . Alcohol use: No    Alcohol/week: 0.0 standard drinks  . Drug use: Yes    Types: Cocaine, Marijuana    Comment: last used cocaine 2015     Allergies   Pneumococcal vaccines; Adhesive [tape]; Crestor [rosuvastatin]; Lipitor [atorvastatin]; and Morphine and related   Review of Systems Review of Systems  All other systems reviewed and are negative.    Physical Exam Updated Vital Signs BP 119/72   Pulse (!) 45   Temp 97.7 F (36.5 C) (Oral)   Resp 18   SpO2 95%   Physical Exam  Constitutional: She appears ill. No distress.  HENT:  Head: Normocephalic and atraumatic.  Right Ear: External ear normal.  Left Ear: External ear normal.  Eyes: Conjunctivae are normal. Right eye exhibits no discharge. Left eye exhibits no discharge. No scleral icterus.  Neck: Neck supple. No tracheal deviation present.  Cardiovascular: Normal rate, regular rhythm and intact distal pulses.  Pulmonary/Chest: Effort normal and breath sounds normal. No stridor. No respiratory distress. She has no wheezes. She has no rales.  Abdominal: Soft. Bowel sounds are normal. She exhibits no distension. There is no tenderness. There is no rebound and no guarding.  Musculoskeletal: She exhibits no edema  or tenderness.    Neurological: She is alert. She has normal strength. No cranial nerve deficit (no facial droop, extraocular movements intact, no slurred speech) or sensory deficit. She exhibits normal muscle tone. She displays no seizure activity. Coordination normal.  Skin: Skin is warm and dry. No rash noted.  Psychiatric: She has a normal mood and affect.  Nursing note and vitals reviewed.    ED Treatments / Results  Labs (all labs ordered are listed, but only abnormal results are displayed) Labs Reviewed  CBC - Abnormal; Notable for the following components:      Result Value   RBC 3.78 (*)    Hemoglobin 11.4 (*)    HCT 35.0 (*)    All other components within normal limits  BASIC METABOLIC PANEL  TROPONIN I  TROPONIN I    EKG EKG Interpretation  Date/Time:  Monday June 20 2018 13:52:18 EDT Ventricular Rate:  61 PR Interval:  138 QRS Duration: 92 QT Interval:  484 QTC Calculation: 487 R Axis:   37 Text Interpretation:  Normal sinus rhythm Prolonged QT Abnormal ECG No significant change since last tracing Confirmed by Linwood Dibbles [id number]) on 06/20/2018 1:57:56 PM   Radiology Dg Chest 2 View  Result Date: 06/20/2018 CLINICAL DATA:  Left-sided chest pain for 5 days. Dizziness and shortness of breath. Coronary artery disease. EXAM: CHEST - 2 VIEW COMPARISON:  04/13/2016 FINDINGS: The heart size and mediastinal contours are within normal limits. Both lungs are clear. Prior CABG again noted. The visualized skeletal structures are unremarkable. IMPRESSION: No active cardiopulmonary disease. Electronically Signed   By: Myles Rosenthal M.D.   On: 06/20/2018 14:29    Procedures Procedures (including critical care time)  Medications Ordered in ED Medications  nitroGLYCERIN (NITROGLYN) 2 % ointment 1 inch (1 inch Topical Given 06/20/18 1503)  aspirin chewable tablet 324 mg (324 mg Oral Given 06/20/18 1502)  fentaNYL (SUBLIMAZE) injection 50 mcg (50 mcg Intravenous Given 06/20/18 1503)      Initial Impression / Assessment and Plan / ED Course  I have reviewed the triage vital signs and the nursing notes.  Pertinent labs & imaging results that were available during my care of the patient were reviewed by me and considered in my medical decision making (see chart for details).  Clinical Course as of Jun 20 1725  Mon Jun 20, 2018  1637 Initial cardiac enzymes are normal   [JK]  1725 Patient states she is feeling better although still intermittently has some pain.   [JK]    Clinical Course User Index [JK] Linwood Dibbles, MD    Patient presented to the emergency room for evaluation of recurrent chest pain.  Patient has known history of coronary artery disease with recurrent episodes of chest pain.  She saw her cardiologist recently and they planned on doing a cardiac CT scan.  Patient states he had a more intense episode of pain that brought her to the ED.  Initial cardiac enzymes are normal.  Initial troponin is negative.  Patient however is at increased risk for for acute coronary syndrome.  She is moderate risk heart score.  Considering her pain I think the reason to bring her in for overnight observation, serial enzymes.    Final Clinical Impressions(s) / ED Diagnoses   Final diagnoses:  Other chest pain      Linwood Dibbles, MD 06/20/18 1755

## 2018-06-20 NOTE — ED Triage Notes (Signed)
Patient complains of left chest pain that radiate to back and shoulder. Patient states she has taken 3 nitro pills today. She states the pill made the pain go away, but the pain came back in about 5 minutes. States feeling lightheaded and dizzy.
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
## 2018-06-21 ENCOUNTER — Observation Stay (HOSPITAL_BASED_OUTPATIENT_CLINIC_OR_DEPARTMENT_OTHER): Payer: Medicaid Other

## 2018-06-21 ENCOUNTER — Encounter (HOSPITAL_COMMUNITY): Payer: Self-pay

## 2018-06-21 DIAGNOSIS — I340 Nonrheumatic mitral (valve) insufficiency: Secondary | ICD-10-CM

## 2018-06-21 DIAGNOSIS — I208 Other forms of angina pectoris: Secondary | ICD-10-CM

## 2018-06-21 DIAGNOSIS — Z951 Presence of aortocoronary bypass graft: Secondary | ICD-10-CM | POA: Diagnosis not present

## 2018-06-21 DIAGNOSIS — I10 Essential (primary) hypertension: Secondary | ICD-10-CM | POA: Diagnosis not present

## 2018-06-21 DIAGNOSIS — J449 Chronic obstructive pulmonary disease, unspecified: Secondary | ICD-10-CM | POA: Diagnosis not present

## 2018-06-21 DIAGNOSIS — R0789 Other chest pain: Secondary | ICD-10-CM | POA: Diagnosis not present

## 2018-06-21 DIAGNOSIS — I2510 Atherosclerotic heart disease of native coronary artery without angina pectoris: Secondary | ICD-10-CM | POA: Diagnosis not present

## 2018-06-21 LAB — HEPARIN LEVEL (UNFRACTIONATED)
HEPARIN UNFRACTIONATED: 0.26 [IU]/mL — AB (ref 0.30–0.70)
HEPARIN UNFRACTIONATED: 0.16 [IU]/mL — AB (ref 0.30–0.70)

## 2018-06-21 LAB — CBC
HEMATOCRIT: 33.6 % — AB (ref 36.0–46.0)
Hemoglobin: 10.8 g/dL — ABNORMAL LOW (ref 12.0–15.0)
MCH: 30.3 pg (ref 26.0–34.0)
MCHC: 32.1 g/dL (ref 30.0–36.0)
MCV: 94.1 fL (ref 78.0–100.0)
PLATELETS: 299 10*3/uL (ref 150–400)
RBC: 3.57 MIL/uL — ABNORMAL LOW (ref 3.87–5.11)
RDW: 13 % (ref 11.5–15.5)
WBC: 5.8 10*3/uL (ref 4.0–10.5)

## 2018-06-21 LAB — ECHOCARDIOGRAM COMPLETE

## 2018-06-21 LAB — NM MYOCAR MULTI W/SPECT W/WALL MOTION / EF
CHL CUP NUCLEAR SSS: 10
LHR: 0.42
LV sys vol: 115 mL
LVDIAVOL: 172 mL (ref 46–106)
NUC STRESS TID: 0.99
Peak HR: 86 {beats}/min
Rest HR: 57 {beats}/min
SDS: 7
SRS: 13

## 2018-06-21 LAB — TROPONIN I: Troponin I: <0.03 ng/mL (ref <0.03)

## 2018-06-21 MED ORDER — SODIUM CHLORIDE 0.9% FLUSH
INTRAVENOUS | Status: AC
  Administered 2018-06-21: 10 mL via INTRAVENOUS
  Filled 2018-06-21: qty 10

## 2018-06-21 MED ORDER — TECHNETIUM TC 99M TETROFOSMIN IV KIT
30.0000 | PACK | Freq: Once | INTRAVENOUS | Status: AC | PRN
  Administered 2018-06-21: 27.9 via INTRAVENOUS

## 2018-06-21 MED ORDER — PRASUGREL HCL 10 MG PO TABS
10.0000 mg | ORAL_TABLET | Freq: Every morning | ORAL | Status: DC
  Administered 2018-06-21 – 2018-06-22 (×2): 10 mg via ORAL
  Filled 2018-06-21 (×4): qty 1

## 2018-06-21 MED ORDER — TECHNETIUM TC 99M TETROFOSMIN IV KIT
10.0000 | PACK | Freq: Once | INTRAVENOUS | Status: AC | PRN
  Administered 2018-06-21: 10 via INTRAVENOUS

## 2018-06-21 MED ORDER — HEPARIN BOLUS VIA INFUSION
1100.0000 [IU] | Freq: Once | INTRAVENOUS | Status: AC
  Administered 2018-06-21: 1100 [IU] via INTRAVENOUS
  Filled 2018-06-21: qty 1100

## 2018-06-21 MED ORDER — HEPARIN BOLUS VIA INFUSION
2000.0000 [IU] | Freq: Once | INTRAVENOUS | Status: AC
  Administered 2018-06-21: 2000 [IU] via INTRAVENOUS
  Filled 2018-06-21: qty 2000

## 2018-06-21 MED ORDER — REGADENOSON 0.4 MG/5ML IV SOLN
INTRAVENOUS | Status: AC
  Administered 2018-06-21: 0.4 mg via INTRAVENOUS
  Filled 2018-06-21: qty 5

## 2018-06-21 NOTE — Progress Notes (Signed)
*  PRELIMINARY RESULTS* Echocardiogram 2D Echocardiogram has been performed.  [name], [name] 06/21/2018, 3:26 PM

## 2018-06-21 NOTE — Progress Notes (Signed)
ANTICOAGULATION CONSULT NOTE - Follow Up Consult  Pharmacy Consult for heparin Indication: chest pain/ACS  Allergies  Allergen Reactions  . Pneumococcal Vaccines Anxiety, Palpitations and Cough  . Adhesive [Tape] Itching and Other (See Comments)    Blisters (pls use paper tape)  . Crestor [Rosuvastatin] Other (See Comments)    Muscle pain  . Lipitor [Atorvastatin] Other (See Comments)    Cramps  . Morphine And Related Itching    Pt states she takes with benadryl    Patient Measurements:   Heparin Dosing Weight: 76 kg  Vital Signs: Temp: 98 F (36.7 C) (09/17 0614) Temp Source: Oral (09/17 0614) BP: 142/66 (09/17 0614) Pulse Rate: 52 (09/17 0614)  Labs: Recent Labs    06/20/18 1418 06/20/18 1713 06/20/18 2314 06/21/18 0524  HGB 11.4*  --   --  10.8*  HCT 35.0*  --   --  33.6*  PLT 332  --   --  299  HEPARINUNFRC  --   --  0.35 0.26*  CREATININE 0.68  --   --   --   TROPONINI <0.03 <0.03 <0.03 <0.03    CrCl cannot be calculated (Unknown ideal weight.).   Medications:  Medications Prior to Admission  Medication Sig Dispense Refill Last Dose  . albuterol (PROAIR HFA) 108 (90 BASE) MCG/ACT inhaler Inhale 2 puffs into the lungs every 6 (six) hours as needed for wheezing or shortness of breath.   Past Week at Unknown time  . albuterol (PROVENTIL) (2.5 MG/3ML) 0.083% nebulizer solution Take 2.5 mg by nebulization every 6 (six) hours as needed for wheezing or shortness of breath.   Past Week at Unknown time  . ALPRAZolam (XANAX) 0.5 MG tablet Take 0.5 mg by mouth 4 (four) times daily.   06/20/2018 at Unknown time  . amLODipine (NORVASC) 10 MG tablet TAKE ONE TABLET BY MOUTH ONCE DAILY. (Patient taking differently: Take 10 mg by mouth daily. ) 30 tablet 6 06/20/2018 at Unknown time  . aspirin EC 81 MG tablet Take 81 mg daily by mouth.   06/20/2018 at Unknown time  . benazepril (LOTENSIN) 10 MG tablet TAKE (1) TABLET BY MOUTH EACH MORNING. (Patient taking differently: Take 10  mg by mouth every morning. ) 90 tablet 0 06/20/2018 at Unknown time  . butalbital-acetaminophen-caffeine (FIORICET, ESGIC) 50-325-40 MG tablet Take 1 tablet by mouth every 4 (four) hours as needed for headache. 14 tablet 0 Past Week at Unknown time  . ezetimibe (ZETIA) 10 MG tablet TAKE ONE TABLET BY MOUTH ONCE DAILY. (Patient taking differently: Take 10 mg by mouth daily. ) 15 tablet 0 06/20/2018 at Unknown time  . fluticasone (FLONASE) 50 MCG/ACT nasal spray Place 2 sprays into both nostrils daily as needed for allergies or rhinitis.   >30 DAYS at Unknown time  . furosemide (LASIX) 80 MG tablet TAKE ONE TABLET DAILY. (Patient taking differently: Take 80 mg by mouth daily. ) 30 tablet 3 06/19/2018 at Unknown time  . HYDROcodone-acetaminophen (NORCO) 10-325 MG per tablet Take 1 tablet by mouth every 4 (four) hours as needed for moderate pain (pain).    06/20/2018 at 600A  . isosorbide mononitrate (IMDUR) 30 MG 24 hr tablet Take 1 tablet (30 mg total) by mouth daily. 90 tablet 1 06/20/2018 at Unknown time  . levETIRAcetam (KEPPRA) 250 MG tablet Take 250 mg by mouth 2 (two) times daily.   06/20/2018 at 900A  . levothyroxine (SYNTHROID, LEVOTHROID) 75 MCG tablet Take 75 mcg by mouth daily before breakfast.  06/20/2018 at Unknown time  . loratadine (CLARITIN) 10 MG tablet Take 1 tablet (10 mg total) by mouth daily as needed for allergies or rhinitis. 30 tablet 0 UNKNOWN  . metoprolol succinate (TOPROL-XL) 50 MG 24 hr tablet TAKE (1) TABLET BY MOUTH EACH MORNING. TAKE WITH OR IMMEDIATELY FOLLOWING A MEAL. (Patient taking differently: Take 50 mg by mouth every morning. ) 30 tablet 6 06/20/2018 at 900A  . Multiple Vitamin (MULTIVITAMIN WITH MINERALS) TABS tablet Take 1 tablet by mouth 2 (two) times daily. 25 tablet 4 06/20/2018 at Unknown time  . nitroGLYCERIN (NITROSTAT) 0.4 MG SL tablet DISSOLVE 1 TABLET UNDER TONGUE EVERY 5 MINUTES UP TO 15 MIN FOR CHESTPAIN. IF NO RELIEF CALL 911. (Patient taking differently:  Place 0.4 mg under the tongue every 5 (five) minutes as needed for chest pain. ) 25 [facility]  . omega-3 acid ethyl esters (LOVAZA) 1 G capsule Take 2 capsules (2 g total) by mouth 2 (two) times daily. 60 capsule 11 06/20/2018 at Unknown time  . omeprazole (PRILOSEC) 20 MG capsule TAKE (1) CAPSULE BY MOUTH ONCE DAILY. (Patient taking differently: Take 20 mg by mouth every morning. ) 30 capsule 6 06/20/2018 at Unknown time  . potassium chloride SA (K-DUR,KLOR-CON) 20 MEQ tablet TAKE (1) TABLET BY MOUTH EACH MORNING. (Patient taking differently: Take 20 mEq by mouth daily. ) 30 tablet 6 06/19/2018 at Unknown time  . prasugrel (EFFIENT) 10 MG TABS tablet TAKE (1) TABLET BY MOUTH EACH MORNING. (Patient taking differently: Take 10 mg by mouth every morning. ) 90 tablet 1 06/20/2018 at Unknown time    Assessment: 62 y.o. female with chest pain for heparin. Heparin level sub-therapeutic at 0.26  Goal of Therapy:  Heparin level 0.3-0.7 units/ml Monitor platelets by anticoagulation protocol: Yes   Plan:  Give 1100 units bolus x 1 Start heparin infusion at 1050 units/hr Check anti-Xa level in 6 hours and daily while on heparin Continue to monitor H&H and platelets  Salvatore DecentSteven C Miki Blank 06/21/2018,11:48 AM
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
## 2018-06-21 NOTE — Progress Notes (Signed)
Patient is nauseous and asked me to hold the morning meds. I gave her zofran and will continue to monitor

## 2018-06-21 NOTE — Progress Notes (Signed)
ANTICOAGULATION CONSULT NOTE - Follow Up Consult  Pharmacy Consult for heparin Indication: chest pain/ACS  Allergies  Allergen Reactions  . Pneumococcal Vaccines Anxiety, Palpitations and Cough  . Adhesive [Tape] Itching and Other (See Comments)    Blisters (pls use paper tape)  . Crestor [Rosuvastatin] Other (See Comments)    Muscle pain  . Lipitor [Atorvastatin] Other (See Comments)    Cramps  . Morphine And Related Itching    Pt states she takes with benadryl   Patient Measurements: Height: 5\' 3"  (160 cm) Weight: 221 lb (100.2 kg) IBW/kg (Calculated) : 52.4 HEPARIN DW (KG): 75.9   Vital Signs: Pulse Rate: 59 (09/17 1403)  Labs: Recent Labs    06/20/18 1418 06/20/18 1713 06/20/18 2314 06/21/18 0524 06/21/18 1802  HGB 11.4*  --   --  10.8*  --   HCT 35.0*  --   --  33.6*  --   PLT 332  --   --  299  --   HEPARINUNFRC  --   --  0.35 0.26* 0.16*  CREATININE 0.68  --   --   --   --   TROPONINI <0.03 <0.03 <0.03 <0.03  --    Estimated Creatinine Clearance: 82.3 mL/min (by C-G formula based on SCr of 0.68 mg/dL).  Assessment: 62 y.o. female with chest pain for heparin. Heparin level remains sub-therapeutic at 0.16, however RN states IV had been out an unknown amount of time during previous shift.  Heparin level may be falsely low.  Goal of Therapy:  Heparin level 0.3-0.7 units/ml Monitor platelets by anticoagulation protocol: Yes   Plan:  Give 2000 units bolus x 1 Increase heparin infusion at 1200 units/hr Check anti-Xa level in 6 hours and daily while on heparin Continue to monitor H&H and platelets   Mady GemmaHayes, Rital Cavey R 06/21/2018,7:42 PM

## 2018-06-21 NOTE — Progress Notes (Signed)
ANTICOAGULATION CONSULT NOTE Pharmacy Consult for heparin Indication: chest pain/ACS  Allergies  Allergen Reactions  . Pneumococcal Vaccines Anxiety, Palpitations and Cough  . Adhesive [Tape] Itching and Other (See Comments)    Blisters (pls use paper tape)  . Crestor [Rosuvastatin] Other (See Comments)    Muscle pain  . Lipitor [Atorvastatin] Other (See Comments)    Cramps  . Morphine And Related Itching    Pt states she takes with benadryl    Patient Measurements:   Heparin Dosing Weight:     Vital Signs: Temp: 98.2 F (36.8 C) (09/16 2049) Temp Source: Oral (09/16 2049) BP: 151/74 (09/16 2049) Pulse Rate: 50 (09/16 2049)  Labs: Recent Labs    06/20/18 1418 06/20/18 1713 06/20/18 2314  HGB 11.4*  --   --   HCT 35.0*  --   --   PLT 332  --   --   HEPARINUNFRC  --   --  0.35  CREATININE 0.68  --   --   TROPONINI <0.03 <0.03 <0.03    Estimated Creatinine Clearance: 82.5 mL/min (by C-G formula based on SCr of 0.68 mg/dL).  Assessment: 62 y.o. female with chest pain for heparin   Goal of Therapy:  Heparin level 0.3-0.7 units/ml Monitor platelets by anticoagulation protocol: Yes   Plan:  Continue Heparin at current rate for now Follow-up am labs.   Shianna Bally, Gary FleetGregory Vernon 06/21/2018,12:55 AM

## 2018-06-21 NOTE — Progress Notes (Signed)
EKG completed and given to nurse. 
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
## 2018-06-22 DIAGNOSIS — R072 Precordial pain: Secondary | ICD-10-CM

## 2018-06-22 LAB — CBC
HCT: 32.8 % — ABNORMAL LOW (ref 36.0–46.0)
HEMATOCRIT: 34.1 % — AB (ref 36.0–46.0)
HEMOGLOBIN: 11.1 g/dL — AB (ref 12.0–15.0)
Hemoglobin: 10.7 g/dL — ABNORMAL LOW (ref 12.0–15.0)
MCH: 30.6 pg (ref 26.0–34.0)
MCH: 30.5 pg (ref 26.0–34.0)
MCHC: 32.6 g/dL (ref 30.0–36.0)
MCHC: 32.6 g/dL (ref 30.0–36.0)
MCV: 93.7 fL (ref 78.0–100.0)
MCV: 93.7 fL (ref 78.0–100.0)
PLATELETS: 289 10*3/uL (ref 150–400)
Platelets: 301 10*3/uL (ref 150–400)
RBC: 3.5 MIL/uL — AB (ref 3.87–5.11)
RBC: 3.64 MIL/uL — AB (ref 3.87–5.11)
RDW: 13 % (ref 11.5–15.5)
RDW: 12.9 % (ref 11.5–15.5)
WBC: 6.6 10*3/uL (ref 4.0–10.5)
WBC: 5.5 10*3/uL (ref 4.0–10.5)

## 2018-06-22 LAB — HEPARIN LEVEL (UNFRACTIONATED): HEPARIN UNFRACTIONATED: 0.64 [IU]/mL (ref 0.30–0.70)

## 2018-06-22 LAB — HIV ANTIBODY (ROUTINE TESTING W REFLEX): HIV SCREEN 4TH GENERATION: NONREACTIVE

## 2018-06-22 NOTE — Progress Notes (Signed)
Removed IV-clean, dry, intact. Reviewed d/c paperwork with patient. No med changes. Answered all questions. Walked stable patient and belongings to ED entrance where she got in her car.
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
## 2018-06-22 NOTE — Progress Notes (Signed)
ANTICOAGULATION CONSULT NOTE - Follow Up Consult  Pharmacy Consult for heparin Indication: chest pain/ACS  Allergies  Allergen Reactions  . Pneumococcal Vaccines Anxiety, Palpitations and Cough  . Adhesive [Tape] Itching and Other (See Comments)    Blisters (pls use paper tape)  . Crestor [Rosuvastatin] Other (See Comments)    Muscle pain  . Lipitor [Atorvastatin] Other (See Comments)    Cramps  . Morphine And Related Itching    Pt states she takes with benadryl   Patient Measurements: Height: 5\' 3"  (160 cm) Weight: 212 lb 15.4 oz (96.6 kg) IBW/kg (Calculated) : 52.4 HEPARIN DW (KG): 74.8   Vital Signs: Temp: 98.2 F (36.8 C) (09/18 0617) Temp Source: Oral (09/17 2041) BP: 117/57 (09/18 0617) Pulse Rate: 46 (09/18 0617)  Labs: Recent Labs    06/20/18 1418 06/20/18 1713  06/20/18 2314 06/21/18 0524 06/21/18 1802 06/22/18 0257  HGB 11.4*  --   --   --  10.8*  --  10.7*  HCT 35.0*  --   --   --  33.6*  --  32.8*  PLT 332  --   --   --  299  --  289  HEPARINUNFRC  --   --    < > 0.35 0.26* 0.16* 0.64  CREATININE 0.68  --   --   --   --   --   --   TROPONINI <0.03 <0.03  --  <0.03 <0.03  --   --    < > = values in this interval not displayed.   Estimated Creatinine Clearance: 80.7 mL/min (by C-G formula based on SCr of 0.68 mg/dL).  Assessment: 62 y.o. female with chest pain for heparin. Heparin level therapeutic at 0.64.  Goal of Therapy:  Heparin level 0.3-0.7 units/ml Monitor platelets by anticoagulation protocol: Yes   Plan:  Continue heparin infusion at 1200 units/hr Check anti-Xa level daily while on heparin Continue to monitor H&H and platelets   Judeth CornfieldSteven Kortland Nichols, PharmD Clinical Pharmacist 06/22/2018 7:39 AM
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
## 2018-07-08 ENCOUNTER — Other Ambulatory Visit: Payer: Self-pay | Admitting: Cardiology

## 2018-07-14 ENCOUNTER — Other Ambulatory Visit: Payer: Self-pay | Admitting: Cardiology

## 2018-07-28 ENCOUNTER — Ambulatory Visit: Payer: Self-pay | Admitting: Cardiology

## 2018-09-30 ENCOUNTER — Encounter: Payer: Self-pay | Admitting: Cardiology

## 2018-09-30 ENCOUNTER — Ambulatory Visit (INDEPENDENT_AMBULATORY_CARE_PROVIDER_SITE_OTHER): Payer: Medicaid Other | Admitting: Cardiology

## 2018-09-30 VITALS — BP 130/76 | HR 60 | Ht 62.0 in | Wt 211.0 lb

## 2018-09-30 DIAGNOSIS — I6523 Occlusion and stenosis of bilateral carotid arteries: Secondary | ICD-10-CM

## 2018-09-30 DIAGNOSIS — E782 Mixed hyperlipidemia: Secondary | ICD-10-CM

## 2018-09-30 DIAGNOSIS — I2510 Atherosclerotic heart disease of native coronary artery without angina pectoris: Secondary | ICD-10-CM

## 2018-09-30 DIAGNOSIS — I10 Essential (primary) hypertension: Secondary | ICD-10-CM

## 2018-09-30 NOTE — Progress Notes (Signed)
Clinical Summary Ms. Eardley is a 62 y.o.female seen today for follow up of the following medical problems.    1. CAD  - CABG 2005 4 vessel  last cath 05/2015 results as reported below, no targets for pci, recs for medical management. Was started on ranexa 510m bid, had headache on nitrates but recently on lower dose. - headaches on ranexa.   06/2018 echo LVEF 55-60% 06/2018 nuclear stress without ischemia  - no recent chest pain.  - compliant with meds. Remains on effient. Looking back had been on long time even before I started seeing her in 2014, I believe her prior cardiologist maintained her on DAPT due to her extensive cardiac revasc history. Has tolerated several years   2. Carotid stenosis  - Carotid [place]2016 40-59% bilateral 131/[id number] 485-92% LICA 492-44%  - no recent neuro symptoms.    3. HTN  - compliant with meds  4. Hyperlipidemia  - did not tolerate crestor, prava, or lovastatin due to side effects. -remains on zetia  - upcoming labs with pcp    Past Medical History:  Diagnosis Date  . Acute renal failure ([place]   . Anemia   . Anxiety   . Arthritis   . Asthma   . Car[place] artery disease [place]    a. Duplex 50-69% BICA 03/2014.  .Marland KitchenChronic back pain   . Collagen vascular disease ([place]   . COPD (chronic obstructive pulmonary disease) ([place]   . Coronary artery disease    a. s/p prior CABG 2005. b. BMS 02/2009 to VG-PDA. c. NSTEMI 08/2010 s/p PTCA/DES to SVG-PDA in previously stented area. d. DES to SVG to RCA on 05/24/14  c. LHC 06/04/2015 w/ no sig change in her anatomy and Rx managment  . DDD (degenerative disc disease)   . Depression   . Diverticulitis    4 documented episodes by CT; most recent April 2012  . GERD (gastroesophageal reflux disease)   . History of blood transfusion   . HTN (hypertension)   . Hyperlipidemia   . Hypertension   . Hypothyroidism   . Ischemic cardiomyopathy    a. EF 40% in 2012 by cath. b.  improved to 50-55% by cath 05/2014  [facility]  . Kidney stones   . Seizures ([place]   . Sinus bradycardia   . Sleep apnea    a. Mild-to-moderate obstructive sleep apnea diagnosed in April 2012 by sleep study.  . Tension headache      Allergies  Allergen Reactions  . Pneumococcal Vaccines Anxiety, Palpitations and Cough  . Adhesive [Tape] Itching and Other (See Comments)    Blisters (pls use paper tape)  . Crestor [Rosuvastatin] Other (See Comments)    Muscle pain  . Lipitor [Atorvastatin] Other (See Comments)    Cramps  . Morphine And Related Itching    Pt states she takes with benadryl     Current Outpatient Medications  Medication Sig Dispense Refill  . albuterol (PROAIR HFA) 108 (90 BASE) MCG/ACT inhaler Inhale 2 puffs into the lungs every 6 (six) hours as needed for wheezing or shortness of breath.    .Marland Kitchenalbuterol (PROVENTIL) (2.5 MG/3ML) 0.083% nebulizer solution Take 2.5 mg by nebulization every 6 (six) hours as needed for wheezing or shortness of breath.    . ALPRAZolam (XANAX) 0.5 MG tablet Take 0.5 mg by mouth 4 (four) times daily.    .Marland KitchenamLODipine (NORVASC) 10 MG tablet TAKE ONE TABLET BY MOUTH  ONCE DAILY. (Patient taking differently: Take 10 mg by mouth daily. ) 30 tablet 6  . aspirin EC 81 MG tablet Take 81 mg daily by mouth.    . benazepril (LOTENSIN) 10 MG tablet TAKE (1) TABLET BY MOUTH EACH MORNING. (Patient taking differently: Take 10 mg by mouth every morning. ) 90 tablet 0  . butalbital-acetaminophen-caffeine (FIORICET, ESGIC) 50-325-40 MG tablet Take 1 tablet by mouth every 4 (four) hours as needed for headache. 14 tablet 0  . ezetimibe (ZETIA) 10 MG tablet TAKE ONE TABLET BY MOUTH ONCE DAILY. (Patient taking differently: Take 10 mg by mouth daily. ) 15 tablet 0  . fluticasone (FLONASE) 50 MCG/ACT nasal spray Place 2 sprays into both nostrils daily as needed for allergies or rhinitis.    . furosemide (LASIX) 80 MG tablet TAKE ONE TABLET DAILY.  (Patient taking differently: Take 80 mg by mouth daily. ) 30 tablet 3  . HYDROcodone-acetaminophen (NORCO) 10-325 MG per tablet Take 1 tablet by mouth every 4 (four) hours as needed for moderate pain (pain).     . isosorbide mononitrate (IMDUR) 30 MG 24 hr tablet Take 1 tablet (30 mg total) by mouth daily. 90 tablet 1  . levETIRAcetam (KEPPRA) 250 MG tablet Take 250 mg by mouth 2 (two) times daily.    Marland Kitchen levothyroxine (SYNTHROID, LEVOTHROID) 75 MCG tablet Take 75 mcg by mouth daily before breakfast.     . loratadine (CLARITIN) 10 MG tablet Take 1 tablet (10 mg total) by mouth daily as needed for allergies or rhinitis. 30 tablet 0  . metoprolol succinate (TOPROL-XL) 50 MG 24 hr tablet TAKE (1) TABLET BY MOUTH EACH MORNING. TAKE WITH OR IMMEDIATELY FOLLOWING A MEAL. (Patient taking differently: Take 50 mg by mouth every morning. ) 30 tablet 6  . Multiple Vitamin (MULTIVITAMIN WITH MINERALS) TABS tablet Take 1 tablet by mouth 2 (two) times daily. 25 tablet 4  . nitroGLYCERIN (NITROSTAT) 0.4 MG SL tablet DISSOLVE 1 TABLET UNDER TONGUE EVERY 5 MINUTES UP TO 15 MIN FOR CHESTPAIN. IF NO RELIEF CALL 911. 25 tablet 0  . omega-3 acid ethyl esters (LOVAZA) 1 G capsule Take 2 capsules (2 g total) by mouth 2 (two) times daily. 60 capsule 11  . omeprazole (PRILOSEC) 20 MG capsule TAKE (1) CAPSULE BY MOUTH ONCE DAILY. (Patient taking differently: Take 20 mg by mouth every morning. ) 30 capsule 6  . potassium chloride SA (K-DUR,KLOR-CON) 20 MEQ tablet TAKE (1) TABLET BY MOUTH EACH MORNING. (Patient taking differently: Take 20 mEq by mouth daily. ) 30 tablet 6  . prasugrel (EFFIENT) 10 MG TABS tablet TAKE (1) TABLET BY MOUTH EACH MORNING. 90 tablet 0   No current facility-administered medications for this visit.      Past Surgical History:  Procedure Laterality Date  . APPENDECTOMY  ~ 1970  . CARDIAC CATHETERIZATION  Jan 2012   patent stents in RCA graft; CAD stable  . CARDIAC CATHETERIZATION N/A 06/03/2015    Procedure: Left Heart Cath and Cors/Grafts Angiography;  Surgeon: Belva Crome, MD;  Location: [place] [facility];  Service: Cardiovascular;  Laterality: N/A;  . CAROTID ENDARTERECTOMY  2005  . CARPAL TUNNEL WITH CUBITAL TUNNEL Right 2001  . CORONARY ANGIOPLASTY WITH STENT PLACEMENT  2011   Promus Premier DES, 3.5 mm x 12 mm, for 99% proximal stent ISR in the SVG-RPDA   . CORONARY ANGIOPLASTY WITH STENT PLACEMENT  05/24/2014  . CORONARY ARTERY BYPASS GRAFT  2005   LIMA to AL, SVG to  LAD, SVG to Cx, SVG-PDA  . KNEE ARTHROSCOPY W/ ACL RECONSTRUCTION Left 2001  . LAPAROSCOPIC CHOLECYSTECTOMY  2010  . LEFT HEART CATHETERIZATION WITH CORONARY/GRAFT ANGIOGRAM N/A 05/24/2014   Procedure: LEFT HEART CATHETERIZATION WITH Beatrix Fetters;  Surgeon: Leonie Man, MD; EF 50-55%, LAD & D1 100%, LIMA-D1 OK, CFX OK, OM1 & OM2 100%, lat OM Antar Milks 100%, RCA 100%; pSVG-RPDA 99% rx w/ scoring balloon PTCA & 3.5 x 12 mm Promus DES; SVG-LAD, SVG-OM known occluded      . TUBAL LIGATION  ~ 1989  . VAGINAL HYSTERECTOMY  ~ 1993     Allergies  Allergen Reactions  . Pneumococcal Vaccines Anxiety, Palpitations and Cough  . Adhesive [Tape] Itching and Other (See Comments)    Blisters (pls use paper tape)  . Crestor [Rosuvastatin] Other (See Comments)    Muscle pain  . Lipitor [Atorvastatin] Other (See Comments)    Cramps  . Morphine And Related Itching    Pt states she takes with benadryl      Family History  Problem Relation Age of Onset  . Heart attack Mother   . Heart disease Mother   . Breast cancer Mother   . Heart attack Father   . Heart disease Father   . Colon cancer Neg Hx      Social History Ms. Cislo reports that she quit smoking about 3 years ago. Her smoking use included cigarettes. She started smoking about 25 years ago. She has a 10.50 pack-year smoking history. She has never used smokeless tobacco. Ms. Fickel reports no history of alcohol use.   Review of  Systems CONSTITUTIONAL: No weight loss, fever, chills, weakness or fatigue.  HEENT: Eyes: No visual loss, blurred vision, double vision or yellow sclerae.No hearing loss, sneezing, congestion, runny nose or sore throat.  SKIN: No rash or itching.  CARDIOVASCULAR: per hpi RESPIRATORY: No shortness of breath, cough or sputum.  GASTROINTESTINAL: No anorexia, nausea, vomiting or diarrhea. No abdominal pain or blood.  GENITOURINARY: No burning on urination, no polyuria NEUROLOGICAL: No headache, dizziness, syncope, paralysis, ataxia, numbness or tingling in the extremities. No change in bowel or bladder control.  MUSCULOSKELETAL: No muscle, back pain, joint pain or stiffness.  LYMPHATICS: No enlarged nodes. No history of splenectomy.  PSYCHIATRIC: No history of depression or anxiety.  ENDOCRINOLOGIC: No reports of sweating, cold or heat intolerance. No polyuria or polydipsia.  Marland Kitchen   Physical Examination Vitals:   09/30/18 1258  BP: 130/76  Pulse: 60  SpO2: 96%   Vitals:   09/30/18 1258  Weight: 211 lb (95.7 kg)  Height: '5\' 2"'  (1.575 m)    Gen: resting comfortably, no acute distress HEENT: no scleral icterus, pupils equal round and reactive, no palptable cervical adenopathy,  CV: RRR, no m/rg, no jvd Resp: Clear to auscultation bilaterally GI: abdomen is soft, non-tender, non-distended, normal bowel sounds, no hepatosplenomegaly MSK: extremities are warm, no edema.  Skin: warm, no rash Neuro:  no focal deficits Psych: appropriate affect   Diagnostic Studies Cath 2012 Left main coronary was normal.  Left anterior descending artery was occluded just after the septal  perforator. First diagonal Gagan Dillion was subtotally occluded. There was  some filling of the distal LAD and diagonals from collateral branches  from the LIMA. Circumflex coronary artery was occluded in the  midvessel. The AV groove Guido Comp was patent. There was a small first  obtuse marginal Lyssa Hackley with 30-40%  multiple lesions. There was a small  second obtuse marginal Samarth Ogle with 30-40%  discrete lesions. There is a  very large obtuse marginal Malyia Moro seen filling from collaterals from the  left internal mammary artery.  Right coronary artery was 100% occluded proximally with both left-to-  right and right-to-right collaterals. The distal PDA also filled from  the vein graft to the right coronary artery.  The left internal mammary artery was widely patent. It also filled the  distal right large obtuse marginal Mckinnley Cottier and filled retrograde to a  smaller diagonal Teoman Giraud.  There was known occlusion of the 2 left-sided vein grafts. The  saphenous vein graft to the right coronary artery was widely patent.  Bare-metal stent in the mid-to-distal vessel widely patent. There was  poor runoff to the vessel primarily being retrograde into the posterior  lateral Merlon Alcorta, however, there was no new focal stenosis.  RAO ventriculography. RAO ventriculography showed anterior apical and  mid inferior wall hypokinesis. EF was in the 40% range.  Right heart catheterization showed mean pulmonary capillary wedge  pressure of 24, PA pressure was 55/26, RV pressure was 52/5, RA pressure  mean was 16, aortic pressure was 155/76, LV pressure was equal to 167/16  with a post A-wave EDP of 26.  Cardiac output was 6.3 liters per minute with a cardiac index of 3  liters per minute per meter squared by Fick.   IMPRESSION: The patient's coronary artery disease is stable. The  stents in the RCA graft were patent. There are no new lesions that I  can see. She does have collateralized distal right and OM Berlie Hatchel.  Continued medical therapy is warranted. If she continues to have chest  pain, Ranexa may be in order since it is somewhat late in the day and  she had both venous and arterial sheath. She will be kept overnight and  discharged in the morning. She tolerated the procedure well.   03/2014  Echo Study Conclusions  - Left ventricle: The cavity size was normal. Wall thickness was increased in a pattern of mild LVH. Systolic function was normal. The estimated ejection fraction was in the range of 50% to 55%. There is akinesis of the basalinferolateral myocardium. Features are consistent with a pseudonormal left ventricular filling pattern, with concomitant abnormal relaxation and increased filling pressure (grade 2 diastolic dysfunction). - Aortic valve: Mildly calcified annulus. Trileaflet. There was no significant regurgitation. - Mitral valve: There was trivial regurgitation. - Left atrium: The atrium was mildly to moderately dilated. - Right ventricle: Systolic function was mildly reduced. - Right atrium: Central venous pressure (est): 3 mm Hg. - Atrial septum: No defect or patent foramen ovale was identified. - Tricuspid valve: There was trivial regurgitation. - Pulmonary arteries: Systolic pressure could not be accurately estimated. - Pericardium, extracardiac: There was no pericardial effusion.  Impressions:  - Mild LVH with LVEF 50-55%, basal inferolateral hypokinesis, grade 2 diastolic dysfunction. Mild to moderate left atrial enlargement. Mildly reduced RV contraction. Unable to assess PASP.  03/2014 Carotid US IMPRESSION:  Bilateral 50-69% stenosis. The left internal carotid peak systolic  velocity has increased slightly in the interval from the prior exam.  05/2014 Cath FINDINGS: Hemodynamics: 1. Central Aortic Pressure / Mean: 110/62/81 mmHg 2. Left Ventricular Pressure / LVEDP: 110/14/21 mmHg  Left Ventriculography:  EF:50 at 55 %  Wall Motion:Severe hypokinesis of the Basal to MID inferior-inferolateraL wall with mild hypokinesis of the basal anterior wall  Coronary Anatomy:  Dominance: Right  Left Main: Normal caliber vessel that itself is free of significant disease. Bifurcates into the LAD and  Circumflex LOV:FIEPPIRJ  caliber vessel that is 100% occluded after a septal perforator and small diagonal Patriciaann Rabanal. There is a first diagonal Denny Mccree that is also half percent occluded after initial subtotal occlusion.  Left Circumflex:Moderate caliber vessel it gives rise to a proximal small caliber OM Hearl Heikes that is somewhat tortuous and free of significant disease. The vessel then bifurcates into the introducer complex which is small in caliber and perfuses the basal inferolateral wall. There is a major lateral OM Slyvia Lartigue the visualized the proximal segment is occluded. There is late retrograde filling of the remainder the nature OM which appears to have 2 branches distally. This is faint collaterals from the proximal OM.   RCA: 100% proximal occluded. SVG-RPDA: Large-caliber graft with mildly irregular is proximally, there are overlapping stents in the distal mid graft with 99% napkin ring proximal in-stent restenosis of the most proximal stent that extends just minimally to the unstented segment. The remainder of the graft is relatively free of disease and attached to the mid RPDA.  RPDA: Large-caliber tortuous vessel that reaches almost to the apex.  RPL Sysytem:The RPAV fills via retrograde flow in the RPDA. There are several small posterolateral branches that are relatively free of disease. The PAV is relatively free of disease.  After reviewing the initial angiography, the culprit lesion was thought to be the 99% proximal stent ISR in the SVG-RPDA. Preparation were made to proceed with PCI on this lesion. As the lesion is focal &appears to be fibrous in nature and too stenosed to safely pass a filter wire, the decision was made to pre-dilate &stent without distal protection. No evidence of a filling defect was noted besides the "napkin-ring" lesion.  Percutaneous Coronary Intervention: Sheath exchanged for 6 Fr Guide: 6 Fr AL1Guidewire: BMW (after failed attempt with Pro-Water Predilation Balloon:  Angioscult Cutting Balloon 3.5 mm x 10 mm;  ? 12 Atm x 45 Sec,-- excellent predilation of the fibrous lesion at high pressure. Brisk TIMI-3 flow down stream but no sign of no reflow. The decision was made to proceed with stenting. Stent: Promus Premier DES 3.5 mm x 12 mm; overlaps roughly 4 mm into the original stent. ? Deployed at: 20 Atm x 30 Sec Post-dilation Balloon: Graeagle Emerge 4.0 mm x 8 mm;  ? 14 Atm x 30 Sec x 2 -  ? Final Diameter: 4.1 mm  Post deployment angiography in multiple views, with and without guidewire in place revealed excellent stent deployment and lesion coverage. There was no evidence of dissection or perforation. Brisk TIMI-3 flow is noted in the downstream vessel  MEDICATIONS:  Anesthesia: Local Lidocaine 2 ml  Sedation: 4 mg IV Versed, 100 mcg IV fentanyl ;   Omnipaque Contrast: 180 ml  Anticoagulation: IV Heparin 13500 Units total   Anti-Platelet Agent: Effient and aspirin as standing home medication  PATIENT DISPOSITION:   The patient was transferred to the PACU holding area in a hemodynamicaly stable, chest pain free condition.  The patient tolerated the procedure well, and there were no complications. EBL: <5 ml  The patient was stable before, during, and after the procedure.  POST-OPERATIVE DIAGNOSIS:   Severe native CAD with 100% negative RCA, LAD & D1 along with OM 1 and OM 2 as previously described  Severe proximal edge in-stent restenosis of SVG-RPDA   Successful PCI of the proximal edge in-stent restenosis of SVG RCA with scoring balloon angioplasty followed by DES stent placement: Promus Premier 3.5 mm x 12 mm postdilated to 4.1 mm  Widely  patent LIMA-diagonal with distal collaterals to what appears to be the apical LAD.  Known occlusion of the SVG-LAD and SVG-OM  Relatively preserved LVEF as noted on the cardiogram recently. There is known hypokinesis to akinesis of the nasal to mid inferior inferolateral wall as well  as mild hypokinesis of the basal anterior wall assistant with known CAD.  PLAN OF CARE:  Overnight observation post-PCI. Continue home doses of aspirin plus Effient.  Standard post radial cath care with care being removal   Discharge in the morning if stable.  Followup with Dr. Harl Bowie - continue to optimize medical management of existing severe CAD.   05/2015 Cath                                Dominance: Co-dominant            Left Anterior Descending   . Mid LAD lesion, 100% stenosed.   . First Diagonal Baruc Tugwell   The vessel is small in size.   . 1st Diag lesion, 99% stenosed.         Ramus Intermedius  The vessel is small .            Left Circumflex  The vessel is small .   Marland Kitchen Mid Cx to Dist Cx lesion, 100% stenosed.   . Second Obtuse Marginal Joscelynn Brutus   The vessel is small in size.   . Third Obtuse Marginal Tejah Brekke   3rd Mrg filled by collaterals from 1st Mrg.            Right Coronary Artery   . Prox RCA to Mid RCA lesion, 100% stenosed.   . Dist RCA lesion, 100% stenosed.            Graft Angiography        Free Graft to Dist LAD  SVG   . Prox Graft to Mid Graft lesion, 100% stenosed.         Free Graft to Dist RCA  SVG   . Mid Graft lesion, 30% stenosed. The lesion was previously treated with a stent (unknown type) .         Free Graft to 2nd Mrg  SVG   . Origin to Prox Graft lesion, 100% stenosed.     Free LIMA Graft to 2nd Diag  LIMA        Cath RECOMMENDATIONS:   Continued aggressive risk factor modification  No anatomy amenable to percutaneous intervention.    09/2017 carotid  US  Final Interpretation: Right Carotid: There is evidence in the right ICA of a 40-59% stenosis. Stable        40-59% RICA stenosis.  Left Carotid: There is evidence in the left ICA of a 40-59% stenosis. Stable       38-18% LICA stenosis.  Vertebrals: Both vertebral arteries were patent with antegrade flow. Subclavians: Normal flow hemodynamics were seen in bilateral subclavian       arteries.   06/2018 nuclear stress   Moderate inferior (base, mid, distal), inferolateral (base, mid) defect consistent with scar and possible soft tissue attenuation No significant ischemia  LVEF 33%  High risk scan due to scar and depressed LVEF  06/2018 echo Study Conclusions  - Left ventricle: The cavity size was mildly dilated. Wall   thickness was normal. Systolic function was normal. The estimated   ejection fraction was in the range of 55% to 60%. - Mitral valve: There was  mild regurgitation. - Left atrium: The atrium was mildly dilated. - Pulmonary arteries: PA peak pressure: 32 mm Hg (S).   Assessment and Plan   1. CAD  - intermittent chest pain for some time - antianginal therapy limited. She is on max dose norvasc. Lower heart rates, would not increase beta blocker. Imdur may be contributing to headaches, lower dose to 62m daily. Prior headaches on ranexa - no recent symptoms, 06/2018 stress test without significant ischemia - continue current meds  2. Carotid stenosis  - moderate by last [place] - repeat [place] continue medical therapy.   3. HTN  - at goal, continue current meds  4. Hyperlipidemia - has not tolerated statins. -continue zetia, follow up upcoming lipid panel with pcp. If LDL not at goal consider repatha  F/u 4 months   JArnoldo Lenis M.D.

## 2018-09-30 NOTE — Patient Instructions (Signed)
Medication Instructions:  Continue all current medications.  Labwork: none  Testing/Procedures: none  Follow-Up: 4 months   Any Other Special Instructions Will Be Listed Below (If Applicable).  If you need a refill on your cardiac medications before your next appointment, please call your pharmacy.\ 

## 2018-10-31 ENCOUNTER — Other Ambulatory Visit: Payer: Self-pay | Admitting: Cardiology

## 2018-11-08 ENCOUNTER — Other Ambulatory Visit: Payer: Self-pay | Admitting: Cardiology

## 2018-11-18 ENCOUNTER — Other Ambulatory Visit: Payer: Self-pay | Admitting: Cardiology

## 2018-11-23 ENCOUNTER — Other Ambulatory Visit: Payer: Self-pay | Admitting: Cardiology

## 2018-12-01 ENCOUNTER — Other Ambulatory Visit: Payer: Self-pay | Admitting: Cardiology

## 2019-01-09 ENCOUNTER — Other Ambulatory Visit: Payer: Self-pay | Admitting: Cardiology

## 2019-01-18 ENCOUNTER — Other Ambulatory Visit: Payer: Self-pay | Admitting: Cardiology

## 2019-02-01 ENCOUNTER — Telehealth: Payer: Self-pay | Admitting: Cardiology

## 2019-02-01 NOTE — Telephone Encounter (Signed)
Virtual Visit Pre-Appointment Phone Call  "(Name), I am calling you today to discuss your upcoming appointment. We are currently trying to limit exposure to the virus that causes COVID-19 by seeing patients at home rather than in the office."  1. "What is the BEST phone number to call the day of the visit?" - include this in appointment notes  2. Do you have or have access to (through a family member/friend) a smartphone with video capability that we can use for your visit?" a. If yes - list this number in appt notes as cell (if different from BEST phone #) and list the appointment type as a VIDEO visit in appointment notes b. If no - list the appointment type as a PHONE visit in appointment notes  3. Confirm consent - "In the setting of the current Covid19 crisis, you are scheduled for a (phone or video) visit with your provider on (date) at (time).  Just as we do with many in-office visits, in order for you to participate in this visit, we must obtain consent.  If you'd like, I can send this to your mychart (if signed up) or email for you to review.  Otherwise, I can obtain your verbal consent now.  All virtual visits are billed to your insurance company just like a normal visit would be.  By agreeing to a virtual visit, we'd like you to understand that the technology does not allow for your provider to perform an examination, and thus may limit your provider's ability to fully assess your condition. If your provider identifies any concerns that need to be evaluated in person, we will make arrangements to do so.  Finally, though the technology is pretty good, we cannot assure that it will always work on either your or our end, and in the setting of a video visit, we may have to convert it to a phone-only visit.  In either situation, we cannot ensure that we have a secure connection.  Are you willing to proceed?" STAFF: Did the patient verbally acknowledge consent to telehealth visit? Document  YES/NO here: YES  4. Advise patient to be prepared - "Two hours prior to your appointment, go ahead and check your blood pressure, pulse, oxygen saturation, and your weight (if you have the equipment to check those) and write them all down. When your visit starts, your provider will ask you for this information. If you have an Apple Watch or Kardia device, please plan to have heart rate information ready on the day of your appointment. Please have a pen and paper handy nearby the day of the visit as well."  5. Give patient instructions for MyChart download to smartphone OR Doximity/Doxy.me as below if video visit (depending on what platform provider is using)  6. Inform patient they will receive a phone call 15 minutes prior to their appointment time (may be from unknown caller ID) so they should be prepared to answer    TELEPHONE CALL NOTE  [name] has been deemed a candidate for a follow-up tele-health visit to limit community exposure during the Covid-19 pandemic. I spoke with the patient via phone to ensure availability of phone/video source, confirm preferred email & phone number, and discuss instructions and expectations.  I reminded [name] to be prepared with any vital sign and/or heart rhythm information that could potentially be obtained via home monitoring, at the time of her visit. I reminded [name] to expect a phone call prior to  her visit.  [name] 02/01/2019 12:29 PM   INSTRUCTIONS FOR DOWNLOADING THE MYCHART APP TO SMARTPHONE  - The patient must first make sure to have activated MyChart and know their login information - If Apple, go to [employer] and type in MyChart in the search bar and download the app. If Android, ask patient to go to [employer] Play Store and type in NellieMyChart in the search bar and download the app. The app is free but as with any other app downloads, their phone may require them to verify saved payment information or  Apple/Android password.  - The patient will need to then log into the app with their MyChart username and password, and select  as their healthcare provider to link the account. When it is time for your visit, go to the MyChart app, find appointments, and click Begin Video Visit. Be sure to Select Allow for your device to access the Microphone and Camera for your visit. You will then be connected, and your provider will be with you shortly.  **If they have any issues connecting, or need assistance please contact MyChart service desk [phone])**  **If using a computer, in order to ensure the best quality for their visit they will need to use either of the following Internet Browsers: [employer], or Google Chrome**  IF USING DOXIMITY or DOXY.ME - The patient will receive a link just prior to their visit by text.     FULL LENGTH CONSENT FOR TELE-HEALTH VISIT   I hereby voluntarily request, consent and authorize [facility] and its employed or contracted physicians, physician assistants, nurse practitioners or other licensed health care professionals (the Practitioner), to provide me with telemedicine health care services (the Services") as deemed necessary by the treating Practitioner. I acknowledge and consent to receive the Services by the Practitioner via telemedicine. I understand that the telemedicine visit will involve communicating with the Practitioner through live audiovisual communication technology and the disclosure of certain medical information by electronic transmission. I acknowledge that I have been given the opportunity to request an in-person assessment or other available alternative prior to the telemedicine visit and am voluntarily participating in the telemedicine visit.  I understand that I have the right to withhold or withdraw my consent to the use of telemedicine in the course of my care at any time, without affecting my right to future care  or treatment, and that the Practitioner or I may terminate the telemedicine visit at any time. I understand that I have the right to inspect all information obtained and/or recorded in the course of the telemedicine visit and may receive copies of available information for a reasonable fee.  I understand that some of the potential risks of receiving the Services via telemedicine include:   Delay or interruption in medical evaluation due to technological equipment failure or disruption;  Information transmitted may not be sufficient (e.g. poor resolution of images) to allow for appropriate medical decision making by the Practitioner; and/or   In rare instances, security protocols could fail, causing a breach of personal health information.  Furthermore, I acknowledge that it is my responsibility to provide information about my medical history, conditions and care that is complete and accurate to the best of my ability. I acknowledge that Practitioner's advice, recommendations, and/or decision may be based on factors not within their control, such as incomplete or inaccurate data provided by me or distortions of diagnostic images or specimens that may result from electronic transmissions. I  understand that the practice of medicine is not an exact science and that Practitioner makes no warranties or guarantees regarding treatment outcomes. I acknowledge that I will receive a copy of this consent concurrently upon execution via email to the email address I last provided but may also request a printed copy by calling the office of [facility].    I understand that my insurance will be billed for this visit.   I have read or had this consent read to me.  I understand the contents of this consent, which adequately explains the benefits and risks of the Services being provided via telemedicine.   I have been provided ample opportunity to ask questions regarding this consent and the Services and have had  my questions answered to my satisfaction.  I give my informed consent for the services to be provided through the use of telemedicine in my medical care  By participating in this telemedicine visit I agree to the above.

## 2019-02-03 ENCOUNTER — Encounter: Payer: Self-pay | Admitting: *Deleted

## 2019-02-03 ENCOUNTER — Telehealth (INDEPENDENT_AMBULATORY_CARE_PROVIDER_SITE_OTHER): Payer: Medicaid Other | Admitting: Cardiology

## 2019-02-03 ENCOUNTER — Encounter: Payer: Self-pay | Admitting: Cardiology

## 2019-02-03 VITALS — Ht 63.0 in | Wt 212.0 lb

## 2019-02-03 DIAGNOSIS — I6523 Occlusion and stenosis of bilateral carotid arteries: Secondary | ICD-10-CM

## 2019-02-03 DIAGNOSIS — E782 Mixed hyperlipidemia: Secondary | ICD-10-CM

## 2019-02-03 DIAGNOSIS — I10 Essential (primary) hypertension: Secondary | ICD-10-CM

## 2019-02-03 DIAGNOSIS — I2510 Atherosclerotic heart disease of native coronary artery without angina pectoris: Secondary | ICD-10-CM

## 2019-02-03 NOTE — Progress Notes (Signed)
Virtual Visit via Telephone Note   This visit type was conducted due to national recommendations for restrictions regarding the COVID-19 Pandemic (e.g. social distancing) in an effort to limit this patient's exposure and mitigate transmission in our community.  Due to her co-morbid illnesses, this patient is at least at moderate risk for complications without adequate follow up.  This format is felt to be most appropriate for this patient at this time.  The patient did not have access to video technology/had technical difficulties with video requiring transitioning to audio format only (telephone).  All issues noted in this document were discussed and addressed.  No physical exam could be performed with this format.  Please refer to the patient's chart for her  consent to telehealth for [facility].   Date:  02/03/2019   ID:  [name], DOB 1955/11/13, MRN [id number]  Patient Location: Home Provider Location: Home  PCP:  Sinda Du, MD  Cardiologist:  Carlyle Dolly, MD  Electrophysiologist:  None   Evaluation Performed:  Follow-Up Visit  Chief Complaint:  4 month visit  History of Present Illness:    [name] is a 63 y.o. female seen today for follow up of the following medical problems.    1. CAD  - CABG 2005 4 vessel  cath 05/2015 results as reported below, no targets for pci, recs for medical management. Was started on ranexa 527m bid, had headache on nitrates but recently on lower dose. - headaches on ranexa.   06/2018 echo LVEF 55-60% 06/2018 nuclear stress without ischemia  -  Remains on effient. Looking back had been on long time even before I started seeing her in 2014, I believe her prior cardiologist maintained her on DAPT due to her extensive cardiac revasc history. Has tolerated several years   - no recent chest pain, no SOB or DOE - compliant with meds   2. Carotid stenosis  - Carotid [place] 40-59% bilateral [id number]  486-38% LICA 417-71% - no recent symptoms  3. HTN  - she is compliant with meds  4. Hyperlipidemia  - did not tolerate crestor, prava, or lovastatin due to side effects. -remains on zetia  - labs followed by pcp, we do not have the most recent results    The patient does not have symptoms concerning for COVID-19 infection (fever, chills, cough, or new shortness of breath).    Past Medical History:  Diagnosis Date   Acute renal failure (HCC)    Anemia    Anxiety    Arthritis    Asthma    Carotid artery disease ([place]    a. Duplex 50-69% BICA 03/2014.   Chronic back pain    Collagen vascular disease (HCC)    COPD (chronic obstructive pulmonary disease) (HCC)    Coronary artery disease    a. s/p prior CABG 2005. b. BMS 02/2009 to VG-PDA. c. NSTEMI 08/2010 s/p PTCA/DES to SVG-PDA in previously stented area. d. DES to SVG to RCA on 05/24/14  c. LHC 06/04/2015 w/ no sig change in her anatomy and Rx managment   DDD (degenerative disc disease)    Depression    Diverticulitis    4 documented episodes by CT; most recent April 2012   GERD (gastroesophageal reflux disease)    History of blood transfusion    HTN (hypertension)    Hyperlipidemia    Hypertension    Hypothyroidism    Ischemic cardiomyopathy    a. EF 40% in 2012 by cath.  b. improved to 50-55% by cath 05/2014  c. EF 35-40% by [facility] 05/2015   Kidney stones    Seizures (HCC)    Sinus bradycardia    Sleep apnea    a. Mild-to-moderate obstructive sleep apnea diagnosed in April 2012 by sleep study.   Tension headache    Past Surgical History:  Procedure Laterality Date   APPENDECTOMY  ~ [place]  Jan 2012   patent stents in RCA graft; CAD stable   CARDIAC CATHETERIZATION N/A 06/03/2015   Procedure: Left Heart Cath and Cors/Grafts Angiography;  Surgeon: Belva Crome, MD;  Location: [facility];  Service: Cardiovascular;  Laterality: N/A;   CAROTID  ENDARTERECTOMY  2005   CARPAL TUNNEL WITH CUBITAL TUNNEL Right 2001   CORONARY ANGIOPLASTY WITH STENT PLACEMENT  2011   Promus Premier DES, 3.5 mm x 12 mm, for 99% proximal stent ISR in the SVG-RPDA    CORONARY ANGIOPLASTY WITH STENT PLACEMENT  05/24/2014   CORONARY ARTERY BYPASS GRAFT  2005   LIMA to AL, SVG to LAD, SVG to Cx, SVG-PDA   KNEE ARTHROSCOPY W/ ACL RECONSTRUCTION Left 2001   LAPAROSCOPIC CHOLECYSTECTOMY  2010   LEFT HEART CATHETERIZATION WITH CORONARY/GRAFT ANGIOGRAM N/A 05/24/2014   Procedure: LEFT HEART CATHETERIZATION WITH Beatrix Fetters;  Surgeon: Leonie Man, MD; EF 50-55%, LAD & D1 100%, LIMA-D1 OK, CFX OK, OM1 & OM2 100%, lat OM Kutter Schnepf 100%, RCA 100%; pSVG-RPDA 99% rx w/ scoring balloon PTCA & 3.5 x 12 mm Promus DES; SVG-LAD, SVG-OM known occluded       TUBAL LIGATION  ~ [place]  ~ 1993     No outpatient medications have been marked as taking for the 02/03/19 encounter (Appointment) with Arnoldo Lenis, MD.     Allergies:   Pneumococcal vaccines; Adhesive [tape]; Crestor [rosuvastatin]; Lipitor [atorvastatin]; and Morphine and related   Social History   Tobacco Use   Smoking status: Former Smoker    Packs/day: 0.50    Years: 21.00    Pack years: 10.50    Types: Cigarettes    Start date: 03/19/1993    Last attempt to quit: 07/29/2015    Years since quitting: 3.5   Smokeless tobacco: Never Used   Tobacco comment: smokes some time/go weeks without smoking  Substance Use Topics   Alcohol use: No    Alcohol/week: 0.0 standard drinks   Drug use: Yes    Types: Cocaine, Marijuana    Comment: last used cocaine 2015     Family Hx: The patient's family history includes Breast cancer in her mother; Heart attack in her father and mother; Heart disease in her father and mother. There is no history of Colon cancer.  ROS:   Please see the history of present illness.    All other systems reviewed and are  negative.   Prior CV studies:   The following studies were reviewed today:  Cath 2012 Left main coronary was normal.  Left anterior descending artery was occluded just after the septal  perforator. First diagonal Laureen Frederic was subtotally occluded. There was  some filling of the distal LAD and diagonals from collateral branches  from the LIMA. Circumflex coronary artery was occluded in the  midvessel. The AV groove Ayron Fillinger was patent. There was a small first  obtuse marginal Donnis Phaneuf with 30-40% multiple lesions. There was a small  second obtuse marginal Daiquan Resnik with 30-40% discrete lesions. There is a  very large obtuse marginal  Kaydance Bowie seen filling from collaterals from the  left internal mammary artery.  Right coronary artery was 100% occluded proximally with both left-to-  right and right-to-right collaterals. The distal PDA also filled from  the vein graft to the right coronary artery.  The left internal mammary artery was widely patent. It also filled the  distal right large obtuse marginal Katrisha Segall and filled retrograde to a  smaller diagonal Preslee Regas.  There was known occlusion of the 2 left-sided vein grafts. The  saphenous vein graft to the right coronary artery was widely patent.  Bare-metal stent in the mid-to-distal vessel widely patent. There was  poor runoff to the vessel primarily being retrograde into the posterior  lateral Sophiarose Eades, however, there was no new focal stenosis.  RAO ventriculography. RAO ventriculography showed anterior apical and  mid inferior wall hypokinesis. EF was in the 40% range.  Right heart catheterization showed mean pulmonary capillary wedge  pressure of 24, PA pressure was 55/26, RV pressure was 52/5, RA pressure  mean was 16, aortic pressure was 155/76, LV pressure was equal to 167/16  with a post A-wave EDP of 26.  Cardiac output was 6.3 liters per minute with a cardiac index of 3  liters per minute per meter squared by  Fick.   IMPRESSION: The patient's coronary artery disease is stable. The  stents in the RCA graft were patent. There are no new lesions that I  can see. She does have collateralized distal right and OM Janna Oak.  Continued medical therapy is warranted. If she continues to have chest  pain, Ranexa may be in order since it is somewhat late in the day and  she had both venous and arterial sheath. She will be kept overnight and  discharged in the morning. She tolerated the procedure well.   03/2014 Echo Study Conclusions  - Left ventricle: The cavity size was normal. Wall thickness was increased in a pattern of mild LVH. Systolic function was normal. The estimated ejection fraction was in the range of 50% to 55%. There is akinesis of the basalinferolateral myocardium. Features are consistent with a pseudonormal left ventricular filling pattern, with concomitant abnormal relaxation and increased filling pressure (grade 2 diastolic dysfunction). - Aortic valve: Mildly calcified annulus. Trileaflet. There was no significant regurgitation. - Mitral valve: There was trivial regurgitation. - Left atrium: The atrium was mildly to moderately dilated. - Right ventricle: Systolic function was mildly reduced. - Right atrium: Central venous pressure (est): 3 mm Hg. - Atrial septum: No defect or patent foramen ovale was identified. - Tricuspid valve: There was trivial regurgitation. - Pulmonary arteries: Systolic pressure could not be accurately estimated. - Pericardium, extracardiac: There was no pericardial effusion.  Impressions:  - Mild LVH with LVEF 50-55%, basal inferolateral hypokinesis, grade 2 diastolic dysfunction. Mild to moderate left atrial enlargement. Mildly reduced RV contraction. Unable to assess PASP.  03/2014 Carotid US IMPRESSION:  Bilateral 50-69% stenosis. The left internal carotid peak systolic  velocity has increased slightly in the interval from the prior  exam.  05/2014 Cath FINDINGS: Hemodynamics: 1. Central Aortic Pressure / Mean: 110/62/81 mmHg 2. Left Ventricular Pressure / LVEDP: 110/14/21 mmHg  Left Ventriculography:  EF:50 at 55 %  Wall Motion:Severe hypokinesis of the Basal to MID inferior-inferolateraL wall with mild hypokinesis of the basal anterior wall  Coronary Anatomy:  Dominance: Right  Left Main: Normal caliber vessel that itself is free of significant disease. Bifurcates into the LAD and Circumflex UTM:LYYTKPTW caliber vessel that is 100% occluded after  a septal perforator and small diagonal Pasqualino Witherspoon. There is a first diagonal Teion Ballin that is also half percent occluded after initial subtotal occlusion.  Left Circumflex:Moderate caliber vessel it gives rise to a proximal small caliber OM Dyer Klug that is somewhat tortuous and free of significant disease. The vessel then bifurcates into the introducer complex which is small in caliber and perfuses the basal inferolateral wall. There is a major lateral OM Naysha Sholl the visualized the proximal segment is occluded. There is late retrograde filling of the remainder the nature OM which appears to have 2 branches distally. This is faint collaterals from the proximal OM.   RCA: 100% proximal occluded. SVG-RPDA: Large-caliber graft with mildly irregular is proximally, there are overlapping stents in the distal mid graft with 99% napkin ring proximal in-stent restenosis of the most proximal stent that extends just minimally to the unstented segment. The remainder of the graft is relatively free of disease and attached to the mid RPDA.  RPDA: Large-caliber tortuous vessel that reaches almost to the apex.  RPL Sysytem:The RPAV fills via retrograde flow in the RPDA. There are several small posterolateral branches that are relatively free of disease. The PAV is relatively free of disease.  After reviewing the initial angiography, the culprit lesion was thought to be the 99%  proximal stent ISR in the SVG-RPDA. Preparation were made to proceed with PCI on this lesion. As the lesion is focal &appears to be fibrous in nature and too stenosed to safely pass a filter wire, the decision was made to pre-dilate &stent without distal protection. No evidence of a filling defect was noted besides the "napkin-ring" lesion.  Percutaneous Coronary Intervention: Sheath exchanged for 6 Fr Guide: 6 Fr AL1Guidewire: BMW (after failed attempt with Pro-Water Predilation Balloon: Angioscult Cutting Balloon 3.5 mm x 10 mm;  ? 12 Atm x 45 Sec,-- excellent predilation of the fibrous lesion at high pressure. Brisk TIMI-3 flow down stream but no sign of no reflow. The decision was made to proceed with stenting. Stent: Promus Premier DES 3.5 mm x 12 mm; overlaps roughly 4 mm into the original stent. ? Deployed at: 20 Atm x 30 Sec Post-dilation Balloon: Willits Emerge 4.0 mm x 8 mm;  ? 14 Atm x 30 Sec x 2 -  ? Final Diameter: 4.1 mm  Post deployment angiography in multiple views, with and without guidewire in place revealed excellent stent deployment and lesion coverage. There was no evidence of dissection or perforation. Brisk TIMI-3 flow is noted in the downstream vessel  MEDICATIONS:  Anesthesia: Local Lidocaine 2 ml  Sedation: 4 mg IV Versed, 100 mcg IV fentanyl ;   Omnipaque Contrast: 180 ml  Anticoagulation: IV Heparin 13500 Units total   Anti-Platelet Agent: Effient and aspirin as standing home medication  PATIENT DISPOSITION:   The patient was transferred to the PACU holding area in a hemodynamicaly stable, chest pain free condition.  The patient tolerated the procedure well, and there were no complications. EBL: <5 ml  The patient was stable before, during, and after the procedure.  POST-OPERATIVE DIAGNOSIS:   Severe native CAD with 100% negative RCA, LAD & D1 along with OM 1 and OM 2 as previously described  Severe proximal edge  in-stent restenosis of SVG-RPDA   Successful PCI of the proximal edge in-stent restenosis of SVG RCA with scoring balloon angioplasty followed by DES stent placement: Promus Premier 3.5 mm x 12 mm postdilated to 4.1 mm  Widely patent LIMA-diagonal with distal collaterals to what appears to  be the apical LAD.  Known occlusion of the SVG-LAD and SVG-OM  Relatively preserved LVEF as noted on the cardiogram recently. There is known hypokinesis to akinesis of the nasal to mid inferior inferolateral wall as well as mild hypokinesis of the basal anterior wall assistant with known CAD.  PLAN OF CARE:  Overnight observation post-PCI. Continue home doses of aspirin plus Effient.  Standard post radial cath care with care being removal   Discharge in the morning if stable.  Followup with Dr. Harl Bowie - continue to optimize medical management of existing severe CAD.   05/2015 Cath                                Dominance: Co-dominant            Left Anterior Descending    Mid LAD lesion, 100% stenosed.    First Diagonal Nam Vossler   The vessel is small in size.    1st Diag lesion, 99% stenosed.         Ramus Intermedius  The vessel is small .            Left Circumflex  The vessel is small .    Mid Cx to Dist Cx lesion, 100% stenosed.    Second Obtuse Marginal Sharese Manrique   The vessel is small in size.    Third Obtuse Marginal Ayanna Gheen   3rd Mrg filled by collaterals from 1st Mrg.            Right Coronary Artery    Prox RCA to Mid RCA lesion, 100% stenosed.    Dist RCA lesion, 100% stenosed.            Graft Angiography        Free Graft to Dist LAD  SVG    Prox Graft to Mid Graft lesion, 100%  stenosed.         Free Graft to Dist RCA  SVG    Mid Graft lesion, 30% stenosed. The lesion was previously treated with a stent (unknown type) .         Free Graft to 2nd Mrg  SVG    Origin to Prox Graft lesion, 100% stenosed.     Free LIMA Graft to 2nd Diag  LIMA        Cath RECOMMENDATIONS:   Continued aggressive risk factor modification  No anatomy amenable to percutaneous intervention.    09/2017 carotid US  Final Interpretation: Right Carotid: There is evidence in the right ICA of a 40-59% stenosis. Stable        40-59% RICA stenosis.  Left Carotid: There is evidence in the left ICA of a 40-59% stenosis. Stable       93-71% LICA stenosis.  Vertebrals: Both vertebral arteries were patent with antegrade flow. Subclavians: Normal flow hemodynamics were seen in bilateral subclavian       arteries.   06/2018 nuclear stress   Moderate inferior (base, mid, distal), inferolateral (base, mid) defect consistent with scar and possible soft tissue attenuation No significant ischemia  LVEF 33%  High risk scan due to scar and depressed LVEF  06/2018 echo Study Conclusions  - Left ventricle: The cavity size was mildly dilated. Wall thickness was normal. Systolic function was normal. The estimated ejection fraction was in the range of 55% to 60%. - Mitral valve: There was mild regurgitation. - Left atrium: The atrium was mildly dilated. - Pulmonary arteries: PA  peak pressure: 32 mm Hg (S).   Labs/Other Tests and Data Reviewed:    EKG:  na  Recent Labs: 06/20/2018: BUN 9; Creatinine, Ser 0.68; Potassium 3.6; Sodium 136 06/22/2018: Hemoglobin 11.1; Platelets 301   Recent Lipid Panel Lab Results  Component Value Date/Time   CHOL 248 (H) 02/14/2016 07:23 AM   TRIG 208 (H) 02/14/2016 07:23 AM   HDL 43 (L) 02/14/2016 07:23 AM    CHOLHDL 5.8 (H) 02/14/2016 07:23 AM   LDLCALC 163 (H) 02/14/2016 07:23 AM    Wt Readings from Last 3 Encounters:  09/30/18 211 lb (95.7 kg)  06/21/18 212 lb 15.4 oz (96.6 kg)  06/07/18 221 lb 12.8 oz (100.6 kg)     Objective:    Vital Signs:  There were no vitals taken for this visit.   Normal affect. Normal speech patten and tone. Comfortable no apparent disgress. No audible signs of SOB or wheezing.   ASSESSMENT & PLAN:    1. CAD e - antianginal therapy limited. She is on max dose norvasc. Lower heart rates, would not increase beta blocker. Imdur may be contributing to headaches, lower dose to 79m daily. Prior headaches on ranexa -  06/2018 stress test without significant ischemia  - no recent symptoms, continue current meds  2. Carotid stenosis  - moderate by last [place]- repeat carotid [place] COVID[place] risk has decreased - continue medical therapy.  3. HTN  -continue current meds  4. Hyperlipidemia - has not tolerated statins, has been on zetia - we will request labs from pcp, pending results consider repatha.     COVID-19 Education: The signs and symptoms of COVID-19 were discussed with the patient and how to seek care for testing (follow up with PCP or arrange E-visit).  The importance of social distancing was discussed today.  Time:   Today, I have spent 18 minutes with the patient with telehealth technology discussing the above problems.     Medication Adjustments/Labs and Tests Ordered: Current medicines are reviewed at length with the patient today.  Concerns regarding medicines are outlined above.   Tests Ordered: No orders of the defined types were placed in this encounter.   Medication Changes: No orders of the defined types were placed in this encounter.   Disposition:  Follow up 4 months  Signed, BCarlyle Dolly MD  02/03/2019 8:20 AM    [place]

## 2019-02-03 NOTE — Patient Instructions (Signed)
Your physician recommends that you schedule a follow-up appointment in: 4 MONTHS WITH DR BRANCH  Your physician recommends that you continue on your current medications as directed. Please refer to the Current Medication list given to you today.  Thank you for choosing [facility]!!    

## 2019-02-06 ENCOUNTER — Other Ambulatory Visit: Payer: Self-pay | Admitting: Cardiology

## 2019-02-13 ENCOUNTER — Other Ambulatory Visit: Payer: Self-pay | Admitting: Cardiology

## 2019-02-16 ENCOUNTER — Telehealth: Payer: Self-pay | Admitting: *Deleted

## 2019-02-16 NOTE — Telephone Encounter (Signed)
-----   Message from Antoine Poche, MD sent at 02/16/2019 10:39 AM EDT ----- Labs reviewed, cholestserol too high. Please start her on repatha 140mg  subcutaneous every 2 weeks. Continue zetia for now, likely stop in the future if repatha controls her    [name] MD ----- Message ----- From: Albertine Tricia, CMA Sent: 02/14/2019   2:20 PM EDT To: Antoine Poche, MD  Labs received and in her chart  [name]  ----- Message ----- From: Antoine Poche, MD Sent: 02/13/2019  10:28 AM EDT To: Antoine Poche, MD, [name], CMA  Did we ever get this patients labs?   JB ----- Message ----- From: Antoine Poche, MD Sent: 02/03/2019   1:32 PM EDT To: Antoine Poche, MD  F/u lipids from [name], start repatha if LDL elevated   JB

## 2019-02-27 ENCOUNTER — Other Ambulatory Visit: Payer: Self-pay | Admitting: Cardiology

## 2019-03-17 ENCOUNTER — Telehealth: Payer: Self-pay | Admitting: *Deleted

## 2019-03-17 NOTE — Telephone Encounter (Signed)
Enrolled in preventice

## 2019-03-17 NOTE — Telephone Encounter (Signed)
-----   Message from Arnoldo Lenis, MD sent at 03/15/2019  9:15 AM EDT ----- Patient followed by [place], her pcp Dr Luan Pulling requested a 14 day event monitor for tachcyardia. Can we please arrange, please put this note in chart as well so we document why monitor was ordered   Zandra Abts MD

## 2019-04-20 ENCOUNTER — Other Ambulatory Visit: Payer: Self-pay | Admitting: Cardiology

## 2019-05-01 ENCOUNTER — Other Ambulatory Visit: Payer: Self-pay | Admitting: Cardiology

## 2019-06-07 ENCOUNTER — Ambulatory Visit: Payer: Medicaid Other | Admitting: Cardiology

## 2019-06-19 ENCOUNTER — Other Ambulatory Visit: Payer: Self-pay | Admitting: Cardiology

## 2019-10-09 ENCOUNTER — Other Ambulatory Visit: Payer: Self-pay | Admitting: Cardiology

## 2019-12-16 ENCOUNTER — Other Ambulatory Visit: Payer: Self-pay

## 2019-12-16 ENCOUNTER — Ambulatory Visit: Payer: Medicaid Other | Attending: Internal Medicine

## 2019-12-16 DIAGNOSIS — Z23 Encounter for immunization: Secondary | ICD-10-CM

## 2019-12-16 NOTE — Progress Notes (Signed)
   Covid-19 Vaccination Clinic  Name:  DRINA JOBST    MRN: [id number] DOB: [birth year]  12/16/2019  Ms. Bergdoll was observed post Covid-19 immunization for 15 minutes without incident. She was provided with Vaccine Information Sheet and instruction to access the V-Safe system.   Ms. Kuenzel was instructed to call 911 with any severe reactions post vaccine: Marland Kitchen Difficulty breathing  . Swelling of face and throat  . A fast heartbeat  . A bad rash all over body  . Dizziness and weakness   Immunizations Administered    Name Date Dose VIS Date Route   Moderna COVID-19 Vaccine 12/16/2019  2:35 PM 0.5 mL 09/05/2019 Intramuscular   Manufacturer: Moderna   Lot: [id number]   NDC: 12244-975-30

## 2019-12-29 ENCOUNTER — Other Ambulatory Visit: Payer: Self-pay | Admitting: Cardiology

## 2020-01-11 ENCOUNTER — Encounter (HOSPITAL_COMMUNITY): Payer: Self-pay | Admitting: Emergency Medicine

## 2020-01-11 ENCOUNTER — Emergency Department (HOSPITAL_COMMUNITY): Payer: Medicaid Other

## 2020-01-11 ENCOUNTER — Other Ambulatory Visit: Payer: Self-pay

## 2020-01-11 ENCOUNTER — Emergency Department (HOSPITAL_COMMUNITY)
Admission: EM | Admit: 2020-01-11 | Discharge: 2020-01-11 | Disposition: A | Discharge status: Home / Self Care | Payer: Medicaid Other | Attending: Emergency Medicine | Admitting: Emergency Medicine

## 2020-01-11 DIAGNOSIS — R1013 Epigastric pain: Secondary | ICD-10-CM | POA: Insufficient documentation

## 2020-01-11 DIAGNOSIS — R112 Nausea with vomiting, unspecified: Secondary | ICD-10-CM | POA: Diagnosis present

## 2020-01-11 DIAGNOSIS — Z7982 Long term (current) use of aspirin: Secondary | ICD-10-CM | POA: Insufficient documentation

## 2020-01-11 DIAGNOSIS — Z87891 Personal history of nicotine dependence: Secondary | ICD-10-CM | POA: Insufficient documentation

## 2020-01-11 DIAGNOSIS — S060X0A Concussion without loss of consciousness, initial encounter: Secondary | ICD-10-CM

## 2020-01-11 DIAGNOSIS — I2510 Atherosclerotic heart disease of native coronary artery without angina pectoris: Secondary | ICD-10-CM | POA: Insufficient documentation

## 2020-01-11 DIAGNOSIS — S0003XA Contusion of scalp, initial encounter: Secondary | ICD-10-CM | POA: Insufficient documentation

## 2020-01-11 DIAGNOSIS — Y92009 Unspecified place in unspecified non-institutional (private) residence as the place of occurrence of the external cause: Secondary | ICD-10-CM | POA: Diagnosis not present

## 2020-01-11 DIAGNOSIS — Y9389 Activity, other specified: Secondary | ICD-10-CM | POA: Diagnosis not present

## 2020-01-11 DIAGNOSIS — I739 Peripheral vascular disease, unspecified: Secondary | ICD-10-CM | POA: Insufficient documentation

## 2020-01-11 DIAGNOSIS — W01190A Fall on same level from slipping, tripping and stumbling with subsequent striking against furniture, initial encounter: Secondary | ICD-10-CM | POA: Insufficient documentation

## 2020-01-11 DIAGNOSIS — Z79899 Other long term (current) drug therapy: Secondary | ICD-10-CM | POA: Diagnosis not present

## 2020-01-11 DIAGNOSIS — R197 Diarrhea, unspecified: Secondary | ICD-10-CM | POA: Insufficient documentation

## 2020-01-11 DIAGNOSIS — J449 Chronic obstructive pulmonary disease, unspecified: Secondary | ICD-10-CM | POA: Insufficient documentation

## 2020-01-11 DIAGNOSIS — I10 Essential (primary) hypertension: Secondary | ICD-10-CM | POA: Insufficient documentation

## 2020-01-11 DIAGNOSIS — Y999 Unspecified external cause status: Secondary | ICD-10-CM | POA: Insufficient documentation

## 2020-01-11 LAB — COMPREHENSIVE METABOLIC PANEL
ALT: 25 U/L (ref 0–44)
AST: 28 U/L (ref 15–41)
Albumin: 4.2 g/dL (ref 3.5–5.0)
Alkaline Phosphatase: 76 U/L (ref 38–126)
Anion gap: 11 (ref 5–15)
BUN: 19 mg/dL (ref 8–23)
CO2: 23 mmol/L (ref 22–32)
Calcium: 8.6 mg/dL — ABNORMAL LOW (ref 8.9–10.3)
Chloride: 102 mmol/L (ref 98–111)
Creatinine, Ser: 0.84 mg/dL (ref 0.44–1.00)
GFR calc Af Amer: >60 mL/min (ref >60)
GFR calc non Af Amer: >60 mL/min (ref >60)
Glucose, Bld: 87 mg/dL (ref 70–99)
Potassium: 3.5 mmol/L (ref 3.5–5.1)
Sodium: 136 mmol/L (ref 135–145)
Total Bilirubin: 0.7 mg/dL (ref 0.3–1.2)
Total Protein: 7 g/dL (ref 6.5–8.1)

## 2020-01-11 LAB — CBC
HCT: 34.3 % — ABNORMAL LOW (ref 36.0–46.0)
Hemoglobin: 11.6 g/dL — ABNORMAL LOW (ref 12.0–15.0)
MCH: 31.6 pg (ref 26.0–34.0)
MCHC: 33.8 g/dL (ref 30.0–36.0)
MCV: 93.5 fL (ref 80.0–100.0)
Platelets: 273 10*3/uL (ref 150–400)
RBC: 3.67 MIL/uL — ABNORMAL LOW (ref 3.87–5.11)
RDW: 13 % (ref 11.5–15.5)
WBC: 8.7 10*3/uL (ref 4.0–10.5)
nRBC: 0 % (ref 0.0–0.2)

## 2020-01-11 LAB — URINALYSIS, ROUTINE W REFLEX MICROSCOPIC
Bilirubin Urine: NEGATIVE
Glucose, UA: NEGATIVE mg/dL
Hgb urine dipstick: NEGATIVE
Ketones, ur: 20 mg/dL — AB
Leukocytes,Ua: NEGATIVE
Nitrite: NEGATIVE
Protein, ur: NEGATIVE mg/dL
Specific Gravity, Urine: 1.025 (ref 1.005–1.030)
pH: 5 (ref 5.0–8.0)

## 2020-01-11 LAB — LIPASE, BLOOD: Lipase: 24 U/L (ref 11–51)

## 2020-01-11 LAB — TROPONIN I (HIGH SENSITIVITY)
Troponin I (High Sensitivity): 13 ng/L (ref <18)
Troponin I (High Sensitivity): 16 ng/L (ref <18)

## 2020-01-11 MED ORDER — METOCLOPRAMIDE HCL 10 MG PO TABS: 10.0000 mg | ORAL_TABLET | Freq: Four times a day (QID) | ORAL | 0 refills | Status: DC | PRN

## 2020-01-11 MED ORDER — METOCLOPRAMIDE HCL 5 MG/ML IJ SOLN
10.0000 mg | Freq: Once | INTRAMUSCULAR | Status: AC
  Administered 2020-01-11: 19:00:00 10 mg via INTRAVENOUS
  Filled 2020-01-11: qty 2

## 2020-01-11 MED ORDER — SODIUM CHLORIDE 0.9 % IV BOLUS
1000.0000 mL | Freq: Once | INTRAVENOUS | Status: AC
  Administered 2020-01-11: 1000 mL via INTRAVENOUS

## 2020-01-11 MED ORDER — IOHEXOL 300 MG/ML  SOLN
100.0000 mL | Freq: Once | INTRAMUSCULAR | Status: AC | PRN
  Administered 2020-01-11: 100 mL via INTRAVENOUS

## 2020-01-11 MED ORDER — DIPHENHYDRAMINE HCL 50 MG/ML IJ SOLN
12.5000 mg | Freq: Once | INTRAMUSCULAR | Status: AC
  Administered 2020-01-11: 12.5 mg via INTRAVENOUS
  Filled 2020-01-11: qty 1

## 2020-01-11 MED ORDER — MORPHINE SULFATE (PF) 4 MG/ML IV SOLN
4.0000 mg | Freq: Once | INTRAVENOUS | Status: AC
  Administered 2020-01-11: 4 mg via INTRAVENOUS
  Filled 2020-01-11: qty 1

## 2020-01-11 MED ORDER — MECLIZINE HCL 25 MG PO TABS: 25.0000 mg | ORAL_TABLET | Freq: Three times a day (TID) | ORAL | 0 refills | Status: DC | PRN

## 2020-01-11 NOTE — ED Triage Notes (Signed)
Per EMS patient from home. Patient states she fell last night after vomiting twice. States nausea x 3 days.   Patient states hitting head during fall. States headache during fall with increased dizziness and increased headache that is worse when sitting up. Patient is on blood thinners at home.

## 2020-01-11 NOTE — Discharge Instructions (Signed)
Get help right away if: °You have severe or worsening headaches. °You have weakness or numbness in any part of your body. °You are confused. °Your coordination gets worse. °You vomit repeatedly. °You are sleepier than normal. °Your speech is slurred. °You cannot recognize people or places. °You have a seizure. °It is difficult to wake you up. °You have unusual behavior changes. °You have changes in your vision. °You lose consciousness. °

## 2020-01-11 NOTE — ED Provider Notes (Signed)
[facility] EMERGENCY DEPARTMENT Provider Note   CSN: [id number] Arrival date & time: 01/11/20  1738     History Chief Complaint  Patient presents with  . Fall    [name] is a 64 y.o. female.  With a past medical history of CAD, peripheral vascular disease, collagen vascular disease, reflux, hypertension, hyperlipidemia, ischemic cardiomyopathy, kidney stones, seizures and reported migraine headaches who presents the emergency department with nausea, vomiting and head injury.  Patient states that she began having, nausea and vomiting along with watery diarrhea 5 days ago. She states that she has not been able to hold down much food or fluids. She has associated epigastric pain but denies chest pain. Patient states she began feeling lightheaded whenever she would stand up. Last night in the middle of the night she got up to go to the bathroom. She is unsure if she lost consciousness because she states that she does not really remember the incidents however she did fall and hit her head on the corner of a desk. Since that time she has had a severe, throbbing frontal headache, light sensitivity, vertigo and has had much worse nausea. She denies neck pain. She does have a history of migraines but states this is nothing like it.                                                                               HPI     Past Medical History:  Diagnosis Date  . Acute renal failure (HCC)   . Anemia   . Anxiety   . Arthritis   . Asthma   . Carotid artery disease (HCC)    a. Duplex 50-69% BICA 03/2014.  Marland Kitchen Chronic back pain   . Collagen vascular disease (HCC)   . COPD (chronic obstructive pulmonary disease) (HCC)   . Coronary artery disease    a. s/p prior CABG 2005. b. BMS 02/2009 to VG-PDA. c. NSTEMI 08/2010 s/p PTCA/DES to SVG-PDA in previously stented area. d. DES to SVG to RCA on 05/24/14  c. LHC 06/04/2015 w/ no sig change in her anatomy and Rx managment  . DDD (degenerative disc disease)     . Depression   . Diverticulitis    4 documented episodes by CT; most recent April 2012  . GERD (gastroesophageal reflux disease)   . History of blood transfusion   . HTN (hypertension)   . Hyperlipidemia   . Hypertension   . Hypothyroidism   . Ischemic cardiomyopathy    a. EF 40% in 2012 by cath. b. improved to 50-55% by cath 05/2014  c. EF 35-40% by [facility] 05/2015  . Kidney stones   . Seizures (HCC)   . Sinus bradycardia   . Sleep apnea    a. Mild-to-moderate obstructive sleep apnea diagnosed in April 2012 by sleep study.  . Tension headache     Patient Active Problem List   Diagnosis Date Noted  . Convulsions (HCC) 04/15/2016  . Angina pectoris (HCC) 04/13/2016  . Chest pain 04/13/2016  . Erroneous encounter - disregard 09/17/2015  . Hypothyroidism   . Ischemic cardiomyopathy   . CAD S/P multiple PCI after CABG 06/04/2015  . Obesity-BMI 41 06/04/2015  .  Unstable angina (HCC) 06/03/2015  . 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 LLQ abdominal pain 12/02/2011  . Epigastric pain 04/19/2011  . Anemia 04/19/2011  . Diverticulitis 01/15/2011  . DIVERTICULITIS OF COLON 07/14/2010  . DIARRHEA, CHRONIC 07/14/2010  . Hx of CABG 2005 01/23/2010  . HEADACHE 01/23/2010  . Hyperlipidemia with target LDL less than 70 05/27/2009  . Essential hypertension 05/27/2009  . PVD- 50-69% bilat ICA  05/27/2009  . Chronic obstructive pulmonary disease (COPD) (HCC) 05/27/2009  . GERD 05/27/2009  . SEIZURE DISORDER 05/27/2009    Past Surgical History:  Procedure Laterality Date  . APPENDECTOMY  ~ 1970  . CARDIAC CATHETERIZATION  Jan 2012   patent stents in RCA graft; CAD stable  . CARDIAC CATHETERIZATION N/A 06/03/2015   Procedure: Left Heart Cath and Cors/Grafts Angiography;  Surgeon: Lyn RecordsHenry W Smith, MD;  [facility];  Service: Cardiovascular;  Laterality: N/A;  . CAROTID ENDARTERECTOMY  2005  . CARPAL TUNNEL WITH CUBITAL TUNNEL Right 2001  .  CORONARY ANGIOPLASTY WITH STENT PLACEMENT  2011   Promus Premier DES, 3.5 mm x 12 mm, for 99% proximal stent ISR in the SVG-RPDA   . CORONARY ANGIOPLASTY WITH STENT PLACEMENT  05/24/2014  . CORONARY ARTERY BYPASS GRAFT  2005   LIMA to AL, SVG to LAD, SVG to Cx, SVG-PDA  . KNEE ARTHROSCOPY W/ ACL RECONSTRUCTION Left 2001  . LAPAROSCOPIC CHOLECYSTECTOMY  2010  . LEFT HEART CATHETERIZATION WITH CORONARY/GRAFT ANGIOGRAM N/A 05/24/2014   Procedure: LEFT HEART CATHETERIZATION WITH Isabel CapriceORONARY/GRAFT ANGIOGRAM;  Surgeon: Marykay Lexavid W Harding, MD; EF 50-55%, LAD & D1 100%, LIMA-D1 OK, CFX OK, OM1 & OM2 100%, lat OM branch 100%, RCA 100%; pSVG-RPDA 99% rx w/ scoring balloon PTCA & 3.5 x 12 mm Promus DES; SVG-LAD, SVG-OM known occluded      . TUBAL LIGATION  ~ 1989  . VAGINAL HYSTERECTOMY  ~ 1993     OB History   No obstetric history on file.     Family History  Problem Relation Age of Onset  . Heart attack Mother   . Heart disease Mother   . Breast cancer Mother   . Heart attack Father   . Heart disease Father   . Colon cancer Neg Hx     Social History   Tobacco Use  . Smoking status: Former Smoker    Packs/day: 0.50    Years: 21.00    Pack years: 10.50    Types: Cigarettes    Start date: 03/19/1993    Quit date: 07/29/2015    Years since quitting: 4.4  . Smokeless tobacco: Never Used  . Tobacco comment: smokes some time/go weeks without smoking  Substance Use Topics  . Alcohol use: No    Alcohol/week: 0.0 standard drinks  . Drug use: Yes    Types: Cocaine, Marijuana    Comment: last used cocaine 2015    Home Medications Prior to Admission medications   Medication Sig Start Date End Date Taking? Authorizing Provider  albuterol (PROAIR HFA) 108 (90 BASE) MCG/ACT inhaler Inhale 2 puffs into the lungs every 6 (six) hours as needed for wheezing or shortness of breath.    [provider]  albuterol (PROVENTIL) (2.5 MG/3ML) 0.083% nebulizer solution Take 2.5 mg by nebulization  every 6 (six) hours as needed for wheezing or shortness of breath.    [provider]  ALPRAZolam Prudy Feeler(XANAX) 0.5 MG tablet Take 0.5 mg by mouth 4 (four) times daily.  [provider]  amLODipine (NORVASC) 10 MG tablet TAKE ONE TABLET BY MOUTH ONCE DAILY. 12/29/19   Antoine Poche, MD  aspirin EC 81 MG tablet Take 81 mg daily by mouth.    [provider]  benazepril (LOTENSIN) 10 MG tablet TAKE (1) TABLET BY MOUTH EACH MORNING. 02/28/19   Antoine Poche, MD  butalbital-acetaminophen-caffeine (FIORICET, ESGIC) [phone] MG tablet Take 1 tablet by mouth every 4 (four) hours as needed for headache. 04/15/16   Kari Baars, MD  ezetimibe (ZETIA) 10 MG tablet TAKE ONE TABLET BY MOUTH ONCE DAILY. Patient taking differently: Take 10 mg by mouth daily.  10/29/16   Antoine Poche, MD  fluticasone (FLONASE) 50 MCG/ACT nasal spray Place 2 sprays into both nostrils daily as needed for allergies or rhinitis.    [provider]  furosemide (LASIX) 80 MG tablet TAKE ONE TABLET DAILY. 11/23/18   Antoine Poche, MD  HYDROcodone-acetaminophen (NORCO) 10-325 MG per tablet Take 1 tablet by mouth every 4 (four) hours as needed for moderate pain (pain).     [provider]  isosorbide mononitrate (IMDUR) 30 MG 24 hr tablet TAKE 1 TABLET BY MOUTH ONCE DAILY. 02/13/19   Antoine Poche, MD  levETIRAcetam (KEPPRA) 250 MG tablet Take 250 mg by mouth 2 (two) times daily.    [provider]  levothyroxine (SYNTHROID, LEVOTHROID) 75 MCG tablet Take 75 mcg by mouth daily before breakfast.     [provider]  loratadine (CLARITIN) 10 MG tablet Take 1 tablet (10 mg total) by mouth daily as needed for allergies or rhinitis. 05/24/17   Loren Racer, MD  metoprolol succinate (TOPROL-XL) 50 MG 24 hr tablet TAKE (1) TABLET BY MOUTH EACH MORNING. TAKE WITH OR IMMEDIATELY FOLLOWING A MEAL. 06/19/19   Antoine Poche, MD  Multiple Vitamin (MULTIVITAMIN WITH  MINERALS) TABS tablet Take 1 tablet by mouth 2 (two) times daily. 08/28/13   Antoine Poche, MD  nitroGLYCERIN (NITROSTAT) 0.4 MG SL tablet DISSOLVE 1 TABLET UNDER TONGUE EVERY 5 MINUTES UP TO 15 MIN FOR CHESTPAIN. IF NO RELIEF CALL 911. 02/06/19   Antoine Poche, MD  omega-3 acid ethyl esters (LOVAZA) 1 G capsule Take 2 capsules (2 g total) by mouth 2 (two) times daily. 06/05/15   Janetta Hora, PA-C  omeprazole (PRILOSEC) 20 MG capsule TAKE (1) CAPSULE BY MOUTH ONCE DAILY. 02/28/19   Antoine Poche, MD  potassium chloride SA (K-DUR,KLOR-CON) 20 MEQ tablet TAKE (1) TABLET BY MOUTH EACH MORNING. 11/23/18   Antoine Poche, MD  prasugrel (EFFIENT) 10 MG TABS tablet TAKE (1) TABLET BY MOUTH EACH MORNING. 04/20/19   Antoine Poche, MD    Allergies    Pneumococcal vaccines, Adhesive [tape], Crestor [rosuvastatin], Lipitor [atorvastatin], and Morphine and related  Review of Systems   Review of Systems Ten systems reviewed and are negative for acute change, except as noted in the HPI.   Physical Exam Updated Vital Signs BP (!) 144/68   Pulse (!) 59   Temp 99.3 F (37.4 C) (Oral)   Resp 17   Ht 5\' 2"  (1.575 m)   Wt 93 kg   SpO2 97%   BMI 37.49 kg/m   Physical Exam Vitals and nursing note reviewed.  Constitutional:      General: She is not in acute distress.    Appearance: She is well-developed. She is not diaphoretic.  HENT:     Head: Normocephalic.     Comments: Frontal  hematoma with violaceous bruising on the left scalp Eyes:     General: No scleral icterus.    Conjunctiva/sclera: Conjunctivae normal.     Pupils: Pupils are equal, round, and reactive to light.     Comments: No horizontal, vertical or rotational nystagmus  Neck:     Comments: Full active and passive ROM without pain No midline or paraspinal tenderness No nuchal rigidity or meningeal signs Cardiovascular:     Rate and Rhythm: Normal rate and regular rhythm.  Pulmonary:     Effort: Pulmonary  effort is normal. No respiratory distress.     Breath sounds: Normal breath sounds. No wheezing or rales.  Abdominal:     General: Bowel sounds are normal.     Palpations: Abdomen is soft.     Tenderness: There is no abdominal tenderness. There is no guarding or rebound.  Musculoskeletal:        General: Normal range of motion.     Cervical back: Normal range of motion and neck supple.  Lymphadenopathy:     Cervical: No cervical adenopathy.  Skin:    General: Skin is warm and dry.     Findings: No rash.  Neurological:     Mental Status: She is alert and oriented to person, place, and time.     Cranial Nerves: No cranial nerve deficit.     Motor: No abnormal muscle tone.     Coordination: Coordination normal.     Comments: Mental Status:  Alert, oriented, thought content appropriate. Speech fluent without evidence of aphasia. Able to follow 2 step commands without difficulty.  Cranial Nerves:  II:  Peripheral visual fields grossly normal, pupils equal, round, reactive to light III,IV, VI: ptosis not present, extra-ocular motions intact bilaterally  V,VII: smile symmetric, facial light touch sensation equal VIII: hearing grossly normal bilaterally  IX,X: midline uvula rise  XI: bilateral shoulder shrug equal and strong XII: midline tongue extension  Motor:  5/5 in upper and lower extremities bilaterally including strong and equal grip strength and dorsiflexion/plantar flexion Sensory: Pinprick and light touch normal in all extremities.  Cerebellar: normal finger-to-nose with bilateral upper extremities Gait: normal gait and balance CV: distal pulses palpable throughout   Psychiatric:        Behavior: Behavior normal.        Thought Content: Thought content normal.        Judgment: Judgment normal.     ED Results / Procedures / Treatments   Labs (all labs ordered are listed, but only abnormal results are displayed) Labs Reviewed  COMPREHENSIVE METABOLIC PANEL  LIPASE,  BLOOD  CBC  URINALYSIS, ROUTINE W REFLEX MICROSCOPIC  TROPONIN I (HIGH SENSITIVITY)    EKG EKG Interpretation  Date/Time:  Thursday January 11 2020 17:59:43 EDT Ventricular Rate:  60 PR Interval:    QRS Duration: 106 QT Interval:  459 QTC Calculation: 459 R Axis:   10 Text Interpretation: Sinus rhythm Ventricular premature complex Borderline short PR interval Repol abnrm suggests ischemia, lateral leads No significant change since last tracing Confirmed by [facility]  MD, Leonette Most [phone]) on 01/11/2020 6:03:45 PM   Radiology No results found.  Procedures Procedures (including critical care time)  Medications Ordered in ED Medications  metoCLOPramide (REGLAN) injection 10 mg (has no administration in time range)  sodium chloride 0.9 % bolus 1,000 mL (has no administration in time range)  diphenhydrAMINE (BENADRYL) injection 12.5 mg (has no administration in time range)  morphine 4 MG/ML injection 4 mg (has no administration in  time range)    ED Course  I have reviewed the triage vital signs and the nursing notes.  Pertinent labs & imaging results that were available during my care of the patient were reviewed by me and considered in my medical decision making (see chart for details).    MDM Rules/Calculators/A&P                      .This patient complains of head injury, nausea and vomiting, this involves an extensive number of treatment options, and is a complaint that carries with it a high risk of complications and morbidity. The emergent differential diagnosis for vomiting includes, but is not limited to ACS/MI, Boerhaave's, DKA, Intracranial Hemorrhage, subdural hematoma concussion ischemic bowel, Meningitis, Sepsis,Acute radiation syndrome, Acute gastric dilation, Acetaminophen toxicity, Adrenal insufficiency, Appendicitis, Aspirin toxicity, Bowel obstruction/ileus, Carbon monoxide poisoning, Cholecystitis, CNS tumor. Digoxin toxicity, Electrolyte abnormalities, Elevated ICP,  Gastric outlet obstruction, Hyperemesis gravidarum, Pancreatitis, Peritonitis, Ruptured viscus, Testicular torsion/ovarian torsion, Theophyline toxicity, Biliary colic, Cannabinoid hyperemesis syndrome, Chemotherapy, Disulfiram effect, Erythromycin, ETOH, Gastritis, Gastroenteritis, Gastroparesis, Hepatitis, Ibuprofen, Ipecac toxicity, Labyrinthitis, Migraine, Motion sickness, Narcotic withdrawal, Thyroid, Pregnancy, Peptic ulcer disease, Renal colic, and UTI  I Ordered, reviewed, and interpreted labs, which included troponin I with delta over 2 hours both negative and no evidence of ACS, lipase which is within normal limits, CMP shows slightly decreased calcium level of insignificant value.  Urine is without evidence of infection.  Patient has a mild normocytic anemia of insignificant value.  Patient's EKG shows normal sinus rhythm at a rate of 60 with intermittent PVCs. I ordered medication for headache and nausea including Reglan, morphine and Benadryl  I ordered imaging studies which included CT abdomen pelvis with contrast, CT head without contrast and CT C-spine and I independently visualized and interpreted imaging which showed CT abdomen pelvis is without evidence of bowel obstruction or other acute infectious or inflammatory process, CT head and C-spine show no acute fractures, malalignments, subdural hematoma or intracranial hemorrhage on my interpretation. Previous records obtained and reviewed   After the interventions stated above, I reevaluated the patient and found that her headache and nausea are greatly improved.  She is tolerating p.o. fluids at this time. The patient's imaging is negative for cerebral contusion, subdural hematoma or intracranial hemorrhage.  I suspect that her symptoms are consistent with concussion after hitting her head.  I have given the patient home care precautions.  Patient is not working and does not need a note.  She will be discharged with meclizine and Reglan.   She may take Tylenol or Motrin.  She is not to drive until she is feeling better in terms of her concentration abilities and balance issues.  Patient understands precautions, she appears appropriate for discharge at this time.  Final Clinical Impression(s) / ED Diagnoses Final diagnoses:  None    Rx / DC Orders ED Discharge Orders    None       Margarita Mail, PA-C 01/11/20 2257    Truddie Hidden, MD 01/12/20 1313

## 2020-01-17 ENCOUNTER — Ambulatory Visit: Payer: Medicaid Other | Attending: Internal Medicine

## 2020-01-29 ENCOUNTER — Other Ambulatory Visit: Payer: Self-pay | Admitting: Cardiology

## 2020-02-26 ENCOUNTER — Other Ambulatory Visit: Payer: Self-pay | Admitting: Cardiology

## 2020-02-27 ENCOUNTER — Other Ambulatory Visit: Payer: Self-pay | Admitting: Cardiology

## 2020-03-05 ENCOUNTER — Telehealth: Payer: Self-pay | Admitting: Orthopedic Surgery

## 2020-03-05 NOTE — Telephone Encounter (Signed)
[name] walked into the officfe around 2:05 stating she needed to see Dr. Romeo Apple for her low back pain.  I told her that we actually don't take walk in appointments but that Dr. Romeo Apple didn't have any openings for today either. I told her that Dr. Romeo Apple had not even come in yet this afternoon as he has been in surgery.  I asked her if she had a PCP that she could call or go to.  She said she had not found a family doctor since Dr. [name] left.  She said she would go out to [facility] to be seen.

## 2020-03-11 ENCOUNTER — Other Ambulatory Visit: Payer: Self-pay | Admitting: Cardiology

## 2020-03-25 ENCOUNTER — Other Ambulatory Visit: Payer: Self-pay | Admitting: Cardiology

## 2020-04-10 ENCOUNTER — Other Ambulatory Visit: Payer: Self-pay | Admitting: Cardiology

## 2020-05-08 ENCOUNTER — Other Ambulatory Visit: Payer: Self-pay | Admitting: Cardiology

## 2020-06-05 ENCOUNTER — Ambulatory Visit
Admission: EM | Admit: 2020-06-05 | Discharge: 2020-06-05 | Disposition: A | Discharge status: Home / Self Care | Payer: Medicaid Other | Attending: Emergency Medicine | Admitting: Emergency Medicine

## 2020-06-05 DIAGNOSIS — N390 Urinary tract infection, site not specified: Secondary | ICD-10-CM | POA: Insufficient documentation

## 2020-06-05 DIAGNOSIS — R300 Dysuria: Secondary | ICD-10-CM | POA: Diagnosis present

## 2020-06-05 DIAGNOSIS — Z1152 Encounter for screening for COVID-19: Secondary | ICD-10-CM | POA: Insufficient documentation

## 2020-06-05 DIAGNOSIS — J069 Acute upper respiratory infection, unspecified: Secondary | ICD-10-CM | POA: Diagnosis present

## 2020-06-05 LAB — POCT URINALYSIS DIP (MANUAL ENTRY)
Glucose, UA: 100 mg/dL — AB
Nitrite, UA: POSITIVE — AB
Protein Ur, POC: 30 mg/dL — AB
Spec Grav, UA: <=1.005 — AB (ref 1.010–1.025)
Urobilinogen, UA: 2 E.U./dL — AB
pH, UA: 5 (ref 5.0–8.0)

## 2020-06-05 MED ORDER — CETIRIZINE HCL 10 MG PO TABS: 10.0000 mg | ORAL_TABLET | Freq: Every day | ORAL | 0 refills | Status: AC

## 2020-06-05 MED ORDER — PHENAZOPYRIDINE HCL 100 MG PO TABS: 100.0000 mg | ORAL_TABLET | Freq: Three times a day (TID) | ORAL | 0 refills | Status: DC | PRN

## 2020-06-05 MED ORDER — FLUTICASONE PROPIONATE 50 MCG/ACT NA SUSP: 1.0000 | Freq: Every day | NASAL | 0 refills | Status: DC

## 2020-06-05 MED ORDER — NITROFURANTOIN MONOHYD MACRO 100 MG PO CAPS: 100.0000 mg | ORAL_CAPSULE | Freq: Two times a day (BID) | ORAL | 0 refills | Status: DC

## 2020-06-05 MED ORDER — BENZONATATE 100 MG PO CAPS: 100.0000 mg | ORAL_CAPSULE | Freq: Three times a day (TID) | ORAL | 0 refills | Status: DC

## 2020-06-05 NOTE — ED Triage Notes (Signed)
Pt presents with dysuria and nasal congestion after covid exposure

## 2020-06-05 NOTE — ED Provider Notes (Signed)
[facility]   CC: Burning with urination  SUBJECTIVE:  [name] is a 64 y.o. female who complains of urinary frequency, urgency and dysuria for the past 2-3 days.  Patient denies a precipitating event, recent sexual encounter, excessive caffeine intake.  Has tried OTC medications without relief.  Symptoms are made worse with urination.  Admits to similar symptoms in the past.  Denies fever, chills, nausea, vomiting, abdominal pain, flank pain, abnormal vaginal discharge or bleeding, hematuria.     who presents for COVID testing after Covid exposure.  Is currently having nasal congestion and cough.  Denies sick exposure to , flu or strep.  Denies recent travel.  Denies aggravating or alleviating symptoms.  Denies previous COVID infection.   Denies fever, chills, fatigue,  rhinorrhea, sore throat,  SOB, wheezing, chest pain, nausea, vomiting, changes in bowel or bladder habits.     LMP: No LMP recorded. Patient has had a hysterectomy.  ROS: As in HPI.  All other pertinent ROS negative.     Past Medical History:  Diagnosis Date   Acute renal failure (HCC)    Anemia    Anxiety    Arthritis    Asthma    Carotid artery disease (HCC)    a. Duplex 50-69% BICA 03/2014.   Chronic back pain    Collagen vascular disease (HCC)    COPD (chronic obstructive pulmonary disease) (HCC)    Coronary artery disease    a. s/p prior CABG 2005. b. BMS 02/2009 to VG-PDA. c. NSTEMI 08/2010 s/p PTCA/DES to SVG-PDA in previously stented area. d. DES to SVG to RCA on 05/24/14  c. LHC 06/04/2015 w/ no sig change in her anatomy and Rx managment   DDD (degenerative disc disease)    Depression    Diverticulitis    4 documented episodes by CT; most recent April 2012   GERD (gastroesophageal reflux disease)    History of blood transfusion    HTN (hypertension)    Hyperlipidemia    Hypertension    Hypothyroidism    Ischemic cardiomyopathy    a. EF 40% in 2012 by cath. b.  improved to 50-55% by cath 05/2014  c. [facility]   Kidney stones    Seizures (HCC)    Sinus bradycardia    Sleep apnea    a. Mild-to-moderate obstructive sleep apnea diagnosed in April 2012 by sleep study.   Tension headache    Past Surgical History:  Procedure Laterality Date   APPENDECTOMY  ~ 1970   CARDIAC CATHETERIZATION  Jan 2012   patent stents in RCA graft; CAD stable   CARDIAC CATHETERIZATION N/A 06/03/2015   Procedure: Left Heart Cath and Cors/Grafts Angiography;  Surgeon: Lyn RecordsHenry W Smith, MD;  [facility];  Service: Cardiovascular;  Laterality: N/A;   CAROTID ENDARTERECTOMY  2005   CARPAL TUNNEL WITH CUBITAL TUNNEL Right 2001   CORONARY ANGIOPLASTY WITH STENT PLACEMENT  2011   Promus Premier DES, 3.5 mm x 12 mm, for 99% proximal stent ISR in the SVG-RPDA    CORONARY ANGIOPLASTY WITH STENT PLACEMENT  05/24/2014   CORONARY ARTERY BYPASS GRAFT  2005   LIMA to AL, SVG to LAD, SVG to Cx, SVG-PDA   KNEE ARTHROSCOPY W/ ACL RECONSTRUCTION Left 2001   LAPAROSCOPIC CHOLECYSTECTOMY  2010   LEFT HEART CATHETERIZATION WITH CORONARY/GRAFT ANGIOGRAM N/A 05/24/2014   Procedure: LEFT HEART CATHETERIZATION WITH Isabel CapriceORONARY/GRAFT ANGIOGRAM;  Surgeon: Marykay Lexavid W Harding, MD; EF 50-55%, LAD & D1  100%, LIMA-D1 OK, CFX OK, OM1 & OM2 100%, lat OM branch 100%, RCA 100%; pSVG-RPDA 99% rx w/ scoring balloon PTCA & 3.5 x 12 mm Promus DES; SVG-LAD, SVG-OM known occluded       TUBAL LIGATION  ~ 1989   VAGINAL HYSTERECTOMY  ~ 1993   Allergies  Allergen Reactions   Pneumococcal Vaccines Anxiety, Palpitations and Cough   Adhesive [Tape] Itching and Other (See Comments)    Blisters (pls use paper tape)   Crestor [Rosuvastatin] Other (See Comments)    Muscle pain   Lipitor [Atorvastatin] Other (See Comments)    Cramps   Morphine And Related Itching    Pt states she takes with benadryl   No current facility-administered medications on file prior to encounter.     Current Outpatient Medications on File Prior to Encounter  Medication Sig Dispense Refill   albuterol (PROAIR HFA) 108 (90 BASE) MCG/ACT inhaler Inhale 2 puffs into the lungs every 6 (six) hours as needed for wheezing or shortness of breath.     albuterol (PROVENTIL) (2.5 MG/3ML) 0.083% nebulizer solution Take 2.5 mg by nebulization every 6 (six) hours as needed for wheezing or shortness of breath.     ALPRAZolam (XANAX) 0.5 MG tablet Take 0.5 mg by mouth 4 (four) times daily.     amLODipine (NORVASC) 10 MG tablet Take 1 tablet (10 mg total) by mouth daily. Patient needs appointment for any future refills. Please call [phone]. 1st attempt. 30 tablet 0   aspirin EC 81 MG tablet Take 81 mg daily by mouth.     benazepril (LOTENSIN) 10 MG tablet TAKE (1) TABLET BY MOUTH EACH MORNING. 90 tablet 3   butalbital-acetaminophen-caffeine (FIORICET, ESGIC) 50-325-40 MG tablet Take 1 tablet by mouth every 4 (four) hours as needed for headache. 14 tablet 0   ezetimibe (ZETIA) 10 MG tablet TAKE ONE TABLET BY MOUTH ONCE DAILY. (Patient taking differently: Take 10 mg by mouth daily. ) 15 tablet 0   furosemide (LASIX) 80 MG tablet TAKE ONE TABLET DAILY. 15 tablet 0   HYDROcodone-acetaminophen (NORCO) 10-325 MG per tablet Take 1 tablet by mouth every 4 (four) hours as needed for moderate pain (pain).      isosorbide mononitrate (IMDUR) 30 MG 24 hr tablet TAKE 1 TABLET BY MOUTH ONCE DAILY. 30 tablet 9   levETIRAcetam (KEPPRA) 250 MG tablet Take 250 mg by mouth 2 (two) times daily.     levothyroxine (SYNTHROID, LEVOTHROID) 75 MCG tablet Take 75 mcg by mouth daily before breakfast.      loratadine (CLARITIN) 10 MG tablet Take 1 tablet (10 mg total) by mouth daily as needed for allergies or rhinitis. 30 tablet 0   meclizine (ANTIVERT) 25 MG tablet Take 1 tablet (25 mg total) by mouth 3 (three) times daily as needed for dizziness. 30 tablet 0   metoCLOPramide (REGLAN) 10 MG tablet Take 1 tablet (10  mg total) by mouth every 6 (six) hours as needed for nausea (nausea/headache). 6 tablet 0   metoprolol succinate (TOPROL-XL) 50 MG 24 hr tablet TAKE (1) TABLET BY MOUTH EACH MORNING. TAKE WITH OR IMMEDIATELY FOLLOWING A MEAL. 90 tablet 3   Multiple Vitamin (MULTIVITAMIN WITH MINERALS) TABS tablet Take 1 tablet by mouth 2 (two) times daily. 25 tablet 4   nitroGLYCERIN (NITROSTAT) 0.4 MG SL tablet DISSOLVE 1 TABLET UNDER TONGUE EVERY 5 MINUTES UP TO 15 MIN FOR CHESTPAIN. IF NO RELIEF CALL 911. 25 tablet 3   omega-3 acid ethyl esters (LOVAZA) 1  G capsule Take 2 capsules (2 g total) by mouth 2 (two) times daily. 60 capsule 11   omeprazole (PRILOSEC) 20 MG capsule TAKE (1) CAPSULE BY MOUTH ONCE DAILY. 90 capsule 3   potassium chloride SA (K-DUR,KLOR-CON) 20 MEQ tablet TAKE (1) TABLET BY MOUTH EACH MORNING. 90 tablet 2   prasugrel (EFFIENT) 10 MG TABS tablet TAKE (1) TABLET BY MOUTH EACH MORNING. 60 tablet 0   Social History   Socioeconomic History   Marital status: Legally Separated    Spouse name: Not on file   Number of children: 3   Years of education: Not on file   Highest education level: Not on file  Occupational History   Occupation: disabled  Tobacco Use   Smoking status: Former Smoker    Packs/day: 0.50    Years: 21.00    Pack years: 10.50    Types: Cigarettes    Start date: 03/19/1993    Quit date: 07/29/2015    Years since quitting: 4.8   Smokeless tobacco: Never Used   Tobacco comment: smokes some time/go weeks without smoking  Vaping Use   Vaping Use: Never used  Substance and Sexual Activity   Alcohol use: No    Alcohol/week: 0.0 standard drinks   Drug use: Yes    Types: Cocaine, Marijuana    Comment: last used cocaine 2015   Sexual activity: Never  Other Topics Concern   Not on file  Social History Narrative   Lives in [place], [place].    Social Determinants of Health   Financial Resource Strain:    Difficulty of Paying Living Expenses: Not on  file  Food Insecurity:    Worried About Programme researcher, broadcasting/film/video in the Last Year: Not on file   [employer] of Food in the Last Year: Not on file  Transportation Needs:    Lack of Transportation (Medical): Not on file   Lack of Transportation (Non-Medical): Not on file  Physical Activity:    Days of Exercise per Week: Not on file   Minutes of Exercise per Session: Not on file  Stress:    Feeling of Stress : Not on file  Social Connections:    Frequency of Communication with Friends and Family: Not on file   Frequency of Social Gatherings with Friends and Family: Not on file   Attends Religious Services: Not on file   Active Member of Clubs or Organizations: Not on file   Attends Banker Meetings: Not on file   Marital Status: Not on file  Intimate Partner Violence:    Fear of Current or Ex-Partner: Not on file   Emotionally Abused: Not on file   Physically Abused: Not on file   Sexually Abused: Not on file   Family History  Problem Relation Age of Onset   Heart attack Mother    Heart disease Mother    Breast cancer Mother    Heart attack Father    Heart disease Father    Colon cancer Neg Hx     OBJECTIVE:  Vitals:   06/05/20 1358  BP: 128/78  Pulse: 66  Resp: 16  Temp: 98.8 F (37.1 C)  TempSrc: Oral  SpO2: 95%   General appearance: AOx3 in no acute distress HEENT: NCAT.  Oropharynx clear.  Lungs: clear to auscultation bilaterally without adventitious breath sounds Heart: regular rate and rhythm.  Radial pulses 2+ symmetrical bilaterally Abdomen: soft; non-distended; no tenderness; bowel sounds present; no guarding or rebound tenderness Back: no CVA  tenderness Extremities: no edema; symmetrical with no gross deformities Skin: warm and dry Neurologic: Ambulates from chair to exam table without difficulty Psychological: alert and cooperative; normal mood and affect  Labs Reviewed  POCT URINALYSIS DIP (MANUAL ENTRY) - Abnormal;  Notable for the following components:      Result Value   Color, UA red (*)    Clarity, UA cloudy (*)    Glucose, UA =100 (*)    Bilirubin, UA small (*)    Ketones, POC UA trace (5) (*)    Spec Grav, UA <=1.005 (*)    Blood, UA trace-intact (*)    Protein Ur, POC =30 (*)    Urobilinogen, UA 2.0 (*)    Nitrite, UA Positive (*)    Leukocytes, UA Large (3+) (*)    All other components within normal limits  URINE CULTURE  NOVEL CORONAVIRUS, NAA    ASSESSMENT & PLAN:  1. Dysuria   2. URI with cough and congestion   3. Encounter for screening for COVID-19   4. Acute lower UTI     Meds ordered this encounter  Medications   nitrofurantoin, macrocrystal-monohydrate, (MACROBID) 100 MG capsule    Sig: Take 1 capsule (100 mg total) by mouth 2 (two) times daily.    Dispense:  10 capsule    Refill:  0   phenazopyridine (PYRIDIUM) 100 MG tablet    Sig: Take 1 tablet (100 mg total) by mouth 3 (three) times daily as needed for pain.    Dispense:  10 tablet    Refill:  0   fluticasone (FLONASE) 50 MCG/ACT nasal spray    Sig: Place 1 spray into both nostrils daily for 14 days.    Dispense:  16 g    Refill:  0   cetirizine (ZYRTEC ALLERGY) 10 MG tablet    Sig: Take 1 tablet (10 mg total) by mouth daily.    Dispense:  30 tablet    Refill:  0   benzonatate (TESSALON) 100 MG capsule    Sig: Take 1 capsule (100 mg total) by mouth every 8 (eight) hours.    Dispense:  30 capsule    Refill:  0    Urine culture sent.  We will call you with the results.   Push fluids and get plenty of rest.   Take antibiotic as directed and to completion Take pyridium as prescribed and as needed for symptomatic relief Follow up with PCP if symptoms persists  COVID testing ordered.  It will take between 2-7 days for test results.  Someone will contact you regarding abnormal results.    In the meantime: You should remain isolated in your home for 10 days from symptom onset AND greater than 24 hours  after symptoms resolution (absence of fever without the use of fever-reducing medication and improvement in respiratory symptoms), whichever is longer Get plenty of rest and push fluids Tessalon Perles prescribed for cough Zyrtec-D prescribed for nasal congestion, runny nose, and/or sore throat Flonase prescribed for nasal congestion and runny nose Use medications daily for symptom relief Use OTC medications like ibuprofen or tylenol as needed fever or pain Call or go to the ED if you have any new or worsening symptoms such as fever, worsening cough, shortness of breath, chest tightness, chest pain, turning blue, changes in mental status, etc...  Outlined signs and symptoms indicating need for more acute intervention. Patient verbalized understanding. After Visit Summary given.    Note: This document was prepared using Dragon voice  recognition software and may include unintentional dictation errors.    Durward Parcel, FNP 06/05/20 1422

## 2020-06-05 NOTE — Discharge Instructions (Addendum)
Urine culture sent.  We will call you with the results.   Push fluids and get plenty of rest.   Take antibiotic as directed and to completion Take pyridium as prescribed and as needed for symptomatic relief Follow up with PCP if symptoms persists  COVID testing ordered.  It will take between 2-7 days for test results.  Someone will contact you regarding abnormal results.    In the meantime: You should remain isolated in your home for 10 days from symptom onset AND greater than 24 hours after symptoms resolution (absence of fever without the use of fever-reducing medication and improvement in respiratory symptoms), whichever is longer Get plenty of rest and push fluids Tessalon Perles prescribed for cough Zyrtec-D prescribed for nasal congestion, runny nose, and/or sore throat Flonase prescribed for nasal congestion and runny nose Use medications daily for symptom relief Use OTC medications like ibuprofen or tylenol as needed fever or pain Call or go to the ED if you have any new or worsening symptoms such as fever, worsening cough, shortness of breath, chest tightness, chest pain, turning blue, changes in mental status, etc..Marland Kitchen

## 2020-06-06 LAB — URINE CULTURE: Culture: <10000 — AB

## 2020-06-07 LAB — NOVEL CORONAVIRUS, NAA: SARS-CoV-2, NAA: DETECTED — AB

## 2020-06-08 ENCOUNTER — Telehealth: Payer: Self-pay | Admitting: Nurse Practitioner

## 2020-06-08 NOTE — Telephone Encounter (Signed)
Contacted patient on behalf of [facility] outreach to discuss how the patient is feeling and to discuss MAB treatment options with patient given recent COVID-19 positive results.  Based on chart review, patient is considered high risk for complications from COVID-19 infection.  Patient reports they are not interested in treatment with MAB at this time.  We have removed the patient from the call back list.  If the patient changes receives additional care and would like to reconsider treatment options and is within 10 days or less of symptom onset, please refer them to call [phone] to be placed on the list for call back.

## 2020-06-14 ENCOUNTER — Other Ambulatory Visit: Payer: Self-pay | Admitting: Cardiology

## 2020-07-03 DIAGNOSIS — I255 Ischemic cardiomyopathy: Secondary | ICD-10-CM | POA: Diagnosis present

## 2020-07-03 DIAGNOSIS — E039 Hypothyroidism, unspecified: Secondary | ICD-10-CM | POA: Diagnosis present

## 2020-07-24 NOTE — Progress Notes (Signed)
Cardiology Office Note  Date: 07/25/2020   ID: [name], DOB [birth year], MRN [id number]  PCP:  Patient, No Pcp Per  Cardiologist:  Carlyle Dolly, MD Electrophysiologist:  None   Chief Complaint: Follow-up post Covid.  History of Present Illness: [name] is a 64 y.o. female with a history of CAD, CABG x4 2005, PVD, collagen vascular disease, GERD, HTN, HLD, ischemic cardiomyopathy, kidney stones, seizures, migraine headache, carotid artery stenosis.  She presented to [name], ED 06/05/2020 for complaints of urinary frequency, urgent urgency, dysuria.  Diagnosed with UTI and treated.  Presented with upper respiratory symptoms with cough and congestion.  She had also been exposed to Covid and presented for testing.  Covid test was positive.   Presented to [name] ED 06/07/2020 with continued abdominal bloating, urinary frequency, urgency, decreased urine output, nausea and vomiting. She was hypokalemic 2.9. Given Rocephin, pyridium, potassium 40 MeQ x 1 dose.  Today she presents post Covid infection and urinary tract infection.  States she is feeling unusually fatigued, with increased dyspnea on exertion.  Complaining of chest pain with and without exertion which sometimes radiates to her neck.  States she was taking nitroglycerin around 3 times per month but recently approximately 2 times per week for chest pain with relief.  She denies any nausea, vomiting, or diaphoresis associated with chest pain.  States she is noting unusual heaviness in both arms recently.  States she spoke to one her nursing friends who works at [employer] who stated she needed to see the cardiologist for the symptoms.  She does not have a PCP since her primary care Dr. Luan [name] retired.  She has had no recent lab work.   Past Medical History:  Diagnosis Date  . Acute renal failure ([place])   . Anemia   . Anxiety   . Arthritis   . Asthma   . Carotid artery disease ([place])    a. Duplex 50-69% BICA  03/2014.  Marland Kitchen Chronic back pain   . Collagen vascular disease ([name])   . COPD (chronic obstructive pulmonary disease) ([place])   . Coronary artery disease    a. s/p prior CABG 2005. b. BMS 02/2009 to VG-PDA. c. NSTEMI 08/2010 s/p PTCA/DES to SVG-PDA in previously stented area. d. DES to SVG to RCA on 05/24/14  c. LHC 06/04/2015 w/ no sig change in her anatomy and Rx managment  . DDD (degenerative disc disease)   . Depression   . Diverticulitis    4 documented episodes by CT; most recent April 2012  . GERD (gastroesophageal reflux disease)   . History of blood transfusion   . HTN (hypertension)   . Hyperlipidemia   . Hypertension   . Hypothyroidism   . Ischemic cardiomyopathy    a. EF 40% in 2012 by cath. b. improved to 50-55% by cath 05/2014  c. EF 35-40% by [facility] 05/2015  . Kidney stones   . Seizures ([name])   . Sinus bradycardia   . Sleep apnea    a. Mild-to-moderate obstructive sleep apnea diagnosed in April 2012 by sleep study.  . Tension headache     Past Surgical History:  Procedure Laterality Date  . APPENDECTOMY  ~ 1970  . CARDIAC CATHETERIZATION  Jan 2012   patent stents in RCA graft; CAD stable  . CARDIAC CATHETERIZATION N/A 06/03/2015   Procedure: Left Heart Cath and Cors/Grafts Angiography;  Surgeon: Belva Crome, MD;  Location: [facility];  Service: Cardiovascular;  Laterality: N/A;  .  CAROTID ENDARTERECTOMY  2005  . CARPAL TUNNEL WITH CUBITAL TUNNEL Right 2001  . CORONARY ANGIOPLASTY WITH STENT PLACEMENT  2011   Promus Premier DES, 3.5 mm x 12 mm, for 99% proximal stent ISR in the SVG-RPDA   . CORONARY ANGIOPLASTY WITH STENT PLACEMENT  05/24/2014  . CORONARY ARTERY BYPASS GRAFT  2005   LIMA to AL, SVG to LAD, SVG to Cx, SVG-PDA  . KNEE ARTHROSCOPY W/ ACL RECONSTRUCTION Left 2001  . LAPAROSCOPIC CHOLECYSTECTOMY  2010  . LEFT HEART CATHETERIZATION WITH CORONARY/GRAFT ANGIOGRAM N/A 05/24/2014   Procedure: LEFT HEART CATHETERIZATION WITH Beatrix Fetters;  Surgeon:  Toni Man, MD; EF 50-55%, LAD & D1 100%, LIMA-D1 OK, CFX OK, OM1 & OM2 100%, lat OM branch 100%, RCA 100%; pSVG-RPDA 99% rx w/ scoring balloon PTCA & 3.5 x 12 mm Promus DES; SVG-LAD, SVG-OM known occluded      . TUBAL LIGATION  ~ 1989  . VAGINAL HYSTERECTOMY  ~ 1993    Current Outpatient Medications  Medication Sig Dispense Refill  . albuterol (PROAIR HFA) 108 (90 BASE) MCG/ACT inhaler Inhale 2 puffs into the lungs every 6 (six) hours as needed for wheezing or shortness of breath.    Marland Kitchen albuterol (PROVENTIL) (2.5 MG/3ML) 0.083% nebulizer solution Take 2.5 mg by nebulization every 6 (six) hours as needed for wheezing or shortness of breath.    . ALPRAZolam (XANAX) 0.5 MG tablet Take 0.5 mg by mouth 4 (four) times daily.    Marland Kitchen amLODipine (NORVASC) 10 MG tablet TAKE ONE TABLET BY MOUTH ONCE DAILY. 30 tablet 1  . aspirin EC 81 MG tablet Take 81 mg daily by mouth.    . benazepril (LOTENSIN) 10 MG tablet TAKE (1) TABLET BY MOUTH EACH MORNING. 90 tablet 3  . benzonatate (TESSALON) 100 MG capsule Take 1 capsule (100 mg total) by mouth every 8 (eight) hours. 30 capsule 0  . butalbital-acetaminophen-caffeine (FIORICET, ESGIC) 50-325-40 MG tablet Take 1 tablet by mouth every 4 (four) hours as needed for headache. 14 tablet 0  . cetirizine (ZYRTEC ALLERGY) 10 MG tablet Take 1 tablet (10 mg total) by mouth daily. 30 tablet 0  . ezetimibe (ZETIA) 10 MG tablet TAKE ONE TABLET BY MOUTH ONCE DAILY. (Patient taking differently: Take 10 mg by mouth daily. ) 15 tablet 0  . fluticasone (FLONASE) 50 MCG/ACT nasal spray Place 1 spray into both nostrils daily for 14 days. 16 g 0  . furosemide (LASIX) 80 MG tablet TAKE ONE TABLET DAILY. 15 tablet 0  . HYDROcodone-acetaminophen (NORCO) 10-325 MG per tablet Take 1 tablet by mouth every 4 (four) hours as needed for moderate pain (pain).     . isosorbide mononitrate (IMDUR) 30 MG 24 hr tablet TAKE 1 TABLET BY MOUTH ONCE DAILY. 30 tablet 9  . levETIRAcetam (KEPPRA) 250  MG tablet Take 250 mg by mouth 2 (two) times daily.    Marland Kitchen levothyroxine (SYNTHROID, LEVOTHROID) 75 MCG tablet Take 75 mcg by mouth daily before breakfast.     . loratadine (CLARITIN) 10 MG tablet Take 1 tablet (10 mg total) by mouth daily as needed for allergies or rhinitis. 30 tablet 0  . meclizine (ANTIVERT) 25 MG tablet Take 1 tablet (25 mg total) by mouth 3 (three) times daily as needed for dizziness. 30 tablet 0  . metoCLOPramide (REGLAN) 10 MG tablet Take 1 tablet (10 mg total) by mouth every 6 (six) hours as needed for nausea (nausea/headache). 6 tablet 0  . metoprolol succinate (TOPROL-XL) 50 MG  24 hr tablet TAKE (1) TABLET BY MOUTH EACH MORNING. TAKE WITH OR IMMEDIATELY FOLLOWING A MEAL. 90 tablet 3  . Multiple Vitamin (MULTIVITAMIN WITH MINERALS) TABS tablet Take 1 tablet by mouth 2 (two) times daily. 25 tablet 4  . nitrofurantoin, macrocrystal-monohydrate, (MACROBID) 100 MG capsule Take 1 capsule (100 mg total) by mouth 2 (two) times daily. 10 capsule 0  . nitroGLYCERIN (NITROSTAT) 0.4 MG SL tablet DISSOLVE 1 TABLET UNDER TONGUE EVERY 5 MINUTES UP TO 15 MIN FOR CHESTPAIN. IF NO RELIEF CALL 911. 25 tablet 3  . omega-3 acid ethyl esters (LOVAZA) 1 G capsule Take 2 capsules (2 g total) by mouth 2 (two) times daily. 60 capsule 11  . omeprazole (PRILOSEC) 20 MG capsule TAKE (1) CAPSULE BY MOUTH ONCE DAILY. 90 capsule 3  . phenazopyridine (PYRIDIUM) 100 MG tablet Take 1 tablet (100 mg total) by mouth 3 (three) times daily as needed for pain. 10 tablet 0  . potassium chloride SA (K-DUR,KLOR-CON) 20 MEQ tablet TAKE (1) TABLET BY MOUTH EACH MORNING. 90 tablet 2  . prasugrel (EFFIENT) 10 MG TABS tablet TAKE (1) TABLET BY MOUTH EACH MORNING. 60 tablet 0  . rosuvastatin (CRESTOR) 5 MG tablet TAKE ONE TABLET BY MOUTH ONCE DAILY. 30 tablet 1   No current facility-administered medications for this visit.   Allergies:  Pneumococcal vaccines, Adhesive [tape], Crestor [rosuvastatin], Lipitor  [atorvastatin], and Morphine and related   Social History: The patient  reports that she quit smoking about 4 years ago. Her smoking use included cigarettes. She started smoking about 27 years ago. She has a 10.50 pack-year smoking history. She has never used smokeless tobacco. She reports current drug use. Drugs: Cocaine and Marijuana. She reports that she does not drink alcohol.   Family History: The patient's family history includes Breast cancer in her mother; Heart attack in her father and mother; Heart disease in her father and mother.   ROS:  Please see the history of present illness. Otherwise, complete review of systems is positive for none.  All other systems are reviewed and negative.   Physical Exam: VS:  BP 122/70   Pulse 74   Ht _0  (1.575 m)   Wt 200 lb 9.6 oz (91 kg)   SpO2 96%   BMI 36.69 kg/m , BMI Body mass index is 36.69 kg/m.  Wt Readings from Last 3 Encounters:  07/25/20 200 lb 9.6 oz (91 kg)  01/11/20 205 lb (93 kg)  02/03/19 212 lb (96.2 kg)    General: Patient appears comfortable at rest. Neck: Supple, no elevated JVP or carotid bruits, no thyromegaly. Lungs: Clear to auscultation, nonlabored breathing at rest. Cardiac: Regular rate and rhythm, no S3, 2/6 systolic ejection murmur heard at left upper sternal border., no pericardial rub. Extremities: No pitting edema, distal pulses 2+. Skin: Warm and dry. Musculoskeletal: No kyphosis. Neuropsychiatric: Alert and oriented x3, affect grossly appropriate.  ECG:  EKG 01/11/2020 sinus rhythm 60 with PVC, borderline short PR, repolarization abnormality suggest ischemia.  Recent Labwork: 01/11/2020: ALT 25; AST 28; BUN 19; Creatinine, Ser 0.84; Hemoglobin 11.6; Platelets 273; Potassium 3.5; Sodium 136     Component Value Date/Time   CHOL 248 (H) 02/14/2016 0723   TRIG 208 (H) 02/14/2016 0723   HDL 43 (L) 02/14/2016 0723   CHOLHDL 5.8 (H) 02/14/2016 0723   VLDL 42 (H) 02/14/2016 0723   LDLCALC 163 (H)  02/14/2016 0723    Other Studies Reviewed Today:  Diagnostic Studies Cath 2012 Left main coronary  was normal.  Left anterior descending artery was occluded just after the septal  perforator. First diagonal branch was subtotally occluded. There was  some filling of the distal LAD and diagonals from collateral branches  from the LIMA. Circumflex coronary artery was occluded in the  midvessel. The AV groove branch was patent. There was a small first  obtuse marginal branch with 30-40% multiple lesions. There was a small  second obtuse marginal branch with 30-40% discrete lesions. There is a  very large obtuse marginal branch seen filling from collaterals from the  left internal mammary artery.  Right coronary artery was 100% occluded proximally with both left-to-  right and right-to-right collaterals. The distal PDA also filled from  the vein graft to the right coronary artery.  The left internal mammary artery was widely patent. It also filled the  distal right large obtuse marginal branch and filled retrograde to a  smaller diagonal branch.  There was known occlusion of the 2 left-sided vein grafts. The  saphenous vein graft to the right coronary artery was widely patent.  Bare-metal stent in the mid-to-distal vessel widely patent. There was  poor runoff to the vessel primarily being retrograde into the posterior  lateral branch, however, there was no new focal stenosis.  RAO ventriculography. RAO ventriculography showed anterior apical and  mid inferior wall hypokinesis. EF was in the 40% range.  Right heart catheterization showed mean pulmonary capillary wedge  pressure of 24, PA pressure was 55/26, RV pressure was 52/5, RA pressure  mean was 16, aortic pressure was 155/76, LV pressure was equal to 167/16  with a post A-wave EDP of 26.  Cardiac output was 6.3 liters per minute with a cardiac index of 3  liters per minute per meter squared by Fick.    IMPRESSION: The patient's coronary artery disease is stable. The  stents in the RCA graft were patent. There are no new lesions that I  can see. She does have collateralized distal right and OM branch.  Continued medical therapy is warranted. If she continues to have chest  pain, Ranexa may be in order since it is somewhat late in the day and  she had both venous and arterial sheath. She will be kept overnight and  discharged in the morning. She tolerated the procedure well.   03/2014 Echo Study Conclusions  - Left ventricle: The cavity size was normal. Wall thickness was increased in a pattern of mild LVH. Systolic function was normal. The estimated ejection fraction was in the range of 50% to 55%. There is akinesis of the basalinferolateral myocardium. Features are consistent with a pseudonormal left ventricular filling pattern, with concomitant abnormal relaxation and increased filling pressure (grade 2 diastolic dysfunction). - Aortic valve: Mildly calcified annulus. Trileaflet. There was no significant regurgitation. - Mitral valve: There was trivial regurgitation. - Left atrium: The atrium was mildly to moderately dilated. - Right ventricle: Systolic function was mildly reduced. - Right atrium: Central venous pressure (est): 3 mm Hg. - Atrial septum: No defect or patent foramen ovale was identified. - Tricuspid valve: There was trivial regurgitation. - Pulmonary arteries: Systolic pressure could not be accurately estimated. - Pericardium, extracardiac: There was no pericardial effusion.  Impressions:  - Mild LVH with LVEF 50-55%, basal inferolateral hypokinesis, grade 2 diastolic dysfunction. Mild to moderate left atrial enlargement. Mildly reduced RV contraction. Unable to assess PASP.  03/2014 Carotid US IMPRESSION:  Bilateral 50-69% stenosis. The left internal carotid peak systolic  velocity has increased slightly in  the interval from the prior  exam.  05/2014 Cath FINDINGS: Hemodynamics: 1. Central Aortic Pressure / Mean: 110/62/81 mmHg 2. Left Ventricular Pressure / LVEDP: 110/14/21 mmHg  Left Ventriculography:  EF:50 at 55 %  Wall Motion:Severe hypokinesis of the Basal to MID inferior-inferolateraL wall with mild hypokinesis of the basal anterior wall  Coronary Anatomy:  Dominance: Right  Left Main: Normal caliber vessel that itself is free of significant disease. Bifurcates into the LAD and Circumflex [id number] caliber vessel that is 100% occluded after a septal perforator and small diagonal branch. There is a first diagonal branch that is also half percent occluded after initial subtotal occlusion.  Left Circumflex:Moderate caliber vessel it gives rise to a proximal small caliber OM branch that is somewhat tortuous and free of significant disease. The vessel then bifurcates into the introducer complex which is small in caliber and perfuses the basal inferolateral wall. There is a major lateral OM branch the visualized the proximal segment is occluded. There is late retrograde filling of the remainder the nature OM which appears to have 2 branches distally. This is faint collaterals from the proximal OM.   RCA: 100% proximal occluded. SVG-RPDA: Large-caliber graft with mildly irregular is proximally, there are overlapping stents in the distal mid graft with 99% napkin ring proximal in-stent restenosis of the most proximal stent that extends just minimally to the unstented segment. The remainder of the graft is relatively free of disease and attached to the mid RPDA.  RPDA: Large-caliber tortuous vessel that reaches almost to the apex.  RPL Sysytem:The RPAV fills via retrograde flow in the RPDA. There are several small posterolateral branches that are relatively free of disease. The PAV is relatively free of disease.  After reviewing the initial angiography, the culprit lesion was thought to be the 99%  proximal stent ISR in the SVG-RPDA. Preparation were made to proceed with PCI on this lesion. As the lesion is focal &appears to be fibrous in nature and too stenosed to safely pass a filter wire, the decision was made to pre-dilate &stent without distal protection. No evidence of a filling defect was noted besides the "napkin-ring" lesion.  Percutaneous Coronary Intervention: Sheath exchanged for 6 Fr Guide: 6 Fr AL1Guidewire: BMW (after failed attempt with Pro-Water Predilation Balloon: Angioscult Cutting Balloon 3.5 mm x 10 mm;  ? 12 Atm x 45 Sec,-- excellent predilation of the fibrous lesion at high pressure. Brisk TIMI-3 flow down stream but no sign of no reflow. The decision was made to proceed with stenting. Stent: Promus Premier DES 3.5 mm x 12 mm; overlaps roughly 4 mm into the original stent. ? Deployed at: 20 Atm x 30 Sec Post-dilation Balloon: [place] Emerge 4.0 mm x 8 mm;  ? 14 Atm x 30 Sec x 2 -  ? Final Diameter: 4.1 mm  Post deployment angiography in multiple views, with and without guidewire in place revealed excellent stent deployment and lesion coverage. There was no evidence of dissection or perforation. Brisk TIMI-3 flow is noted in the downstream vessel  MEDICATIONS:  Anesthesia: Local Lidocaine 2 ml  Sedation: 4 mg IV Versed, 100 mcg IV fentanyl ;   Omnipaque Contrast: 180 ml  Anticoagulation: IV Heparin 13500 Units total   Anti-Platelet Agent: Effient and aspirin as standing home medication  PATIENT DISPOSITION:   The patient was transferred to the PACU holding area in a hemodynamicaly stable, chest pain free condition.  The patient tolerated the procedure well, and there were no complications. EBL: <5 ml  The patient was stable before, during, and after the procedure.  POST-OPERATIVE DIAGNOSIS:   Severe native CAD with 100% negative RCA, LAD & D1 along with OM 1 and OM 2 as previously described  Severe proximal edge  in-stent restenosis of SVG-RPDA   Successful PCI of the proximal edge in-stent restenosis of SVG RCA with scoring balloon angioplasty followed by DES stent placement: Promus Premier 3.5 mm x 12 mm postdilated to 4.1 mm  Widely patent LIMA-diagonal with distal collaterals to what appears to be the apical LAD.  Known occlusion of the SVG-LAD and SVG-OM  Relatively preserved LVEF as noted on the cardiogram recently. There is known hypokinesis to akinesis of the nasal to mid inferior inferolateral wall as well as mild hypokinesis of the basal anterior wall assistant with known CAD.  PLAN OF CARE:  Overnight observation post-PCI. Continue home doses of aspirin plus Effient.  Standard post radial cath care with care being removal   Discharge in the morning if stable.  Followup with Dr. Harl Bowie - continue to optimize medical management of existing severe CAD.   05/2015 Cath                                Dominance: Co-dominant            Left Anterior Descending   . Mid LAD lesion, 100% stenosed.   . First Diagonal Branch   The vessel is small in size.   . 1st Diag lesion, 99% stenosed.         Ramus Intermedius  The vessel is small .            Left Circumflex  The vessel is small .   Marland Kitchen Mid Cx to Dist Cx lesion, 100% stenosed.   . Second Obtuse Marginal Branch   The vessel is small in size.   . Third Obtuse Marginal Branch   3rd Mrg filled by collaterals from 1st Mrg.            Right Coronary Artery   . Prox RCA to Mid RCA lesion, 100% stenosed.   . Dist RCA lesion, 100% stenosed.            Graft Angiography        Free Graft to Dist LAD  SVG   . Prox Graft to Mid Graft lesion, 100%  stenosed.         Free Graft to Dist RCA  SVG   . Mid Graft lesion, 30% stenosed. The lesion was previously treated with a stent (unknown type) .         Free Graft to 2nd Mrg  SVG   . Origin to Prox Graft lesion, 100% stenosed.     Free LIMA Graft to 2nd Diag  LIMA        Cath RECOMMENDATIONS:   Continued aggressive risk factor modification  No anatomy amenable to percutaneous intervention.    09/2017 carotid US  Final Interpretation: Right Carotid: There is evidence in the right ICA of a 40-59% stenosis. Stable        40-59% RICA stenosis.  Left Carotid: There is evidence in the left ICA of a 40-59% stenosis. Stable       57-32% LICA stenosis.  Vertebrals: Both vertebral arteries were patent with antegrade flow. Subclavians: Normal flow hemodynamics were seen in bilateral subclavian       arteries.   06/2018 nuclear stress  Moderate inferior (base, mid, distal), inferolateral (base, mid) defect consistent with scar and possible soft tissue attenuation No significant ischemia  LVEF 33%  High risk scan due to scar and depressed LVEF  06/2018 echo Study Conclusions  - Left ventricle: The cavity size was mildly dilated. Wall thickness was normal. Systolic function was normal. The estimated ejection fraction was in the range of 55% to 60%. - Mitral valve: There was mild regurgitation. - Left atrium: The atrium was mildly dilated. - Pulmonary arteries: PA peak pressure: 32 mm Hg (S).  Assessment and Plan:  1. CAD in native artery   2. Bilateral carotid artery stenosis   3. Essential hypertension   4. Mixed hyperlipidemia   5. SOB (shortness of breath)   6. Chest pain of uncertain etiology    1. CAD in native artery/chest pain Complaints recent in crease and chest pains.  States she was taking nitroglycerin  approximately 3 times per month previously with relief.  States more recently she takes nitroglycerin about twice a week.  States she is noting some chest pain which radiates from mid chest to left neck with and without exertion.  Denies any associated nausea, vomiting or diaphoresis.  Please get a Lexiscan stress test.  Continue aspirin 81 mg daily, Imdur 30 mg daily, nitroglycerin sublingual p.o. as needed.  Continue Effient 10 mg daily.  2. Bilateral carotid artery stenosis Please get repeat carotid artery duplex study.  Last carotid artery study 2018 she had bilateral 40 to 59% ICA stenosis.  3. Essential hypertension Blood pressure today 122/70.  Continue amlodipine 10 mg daily.  Continue benazepril 10 mg daily.  Continue Lasix 80 mg daily.  Continue Toprol-XL 50 mg daily.  4. Mixed hyperlipidemia Not tolerant of statins.  Had been on Zetia in the past.  Get repeat lipids.  Pending results of lipids will consider Repatha   5.  DOE/shortness of breath Complaining of increased dyspnea on exertion.  States sometimes even when sitting still she has to take deep breaths to feel like she is oxygenating well.  Also noted upon auscultation a 2 out of 6 systolic ejection murmur at best heard at left upper sternal border.  Please get a repeat echocardiogram.  6.  Fatigue Complaining of significant fatigue.  Has not had blood work in quite some time due to not having a PCP.  Please get a CBC, basic metabolic panel, TSH, lipids, hemoglobin A1c.  Medication Adjustments/Labs and Tests Ordered: Current medicines are reviewed at length with the patient today.  Concerns regarding medicines are outlined above.   Disposition: Follow-up with Dr. Harl Bowie or APP 6 to 8 weeks.  Signed, Levell July, NP 07/25/2020 3:17 PM    [name] at [place], [place], [place] [address] Phone: [phone]; Fax: [phone]

## 2020-07-25 ENCOUNTER — Encounter: Payer: Self-pay | Admitting: Family Medicine

## 2020-07-25 ENCOUNTER — Ambulatory Visit (INDEPENDENT_AMBULATORY_CARE_PROVIDER_SITE_OTHER): Payer: Medicaid Other | Admitting: Family Medicine

## 2020-07-25 ENCOUNTER — Other Ambulatory Visit: Payer: Self-pay

## 2020-07-25 ENCOUNTER — Encounter: Payer: Self-pay | Admitting: *Deleted

## 2020-07-25 VITALS — BP 122/70 | HR 74 | Ht 62.0 in | Wt 200.6 lb

## 2020-07-25 DIAGNOSIS — E782 Mixed hyperlipidemia: Secondary | ICD-10-CM

## 2020-07-25 DIAGNOSIS — I2510 Atherosclerotic heart disease of native coronary artery without angina pectoris: Secondary | ICD-10-CM

## 2020-07-25 DIAGNOSIS — R5383 Other fatigue: Secondary | ICD-10-CM

## 2020-07-25 DIAGNOSIS — I6523 Occlusion and stenosis of bilateral carotid arteries: Secondary | ICD-10-CM

## 2020-07-25 DIAGNOSIS — R079 Chest pain, unspecified: Secondary | ICD-10-CM

## 2020-07-25 DIAGNOSIS — I10 Essential (primary) hypertension: Secondary | ICD-10-CM

## 2020-07-25 DIAGNOSIS — R0789 Other chest pain: Secondary | ICD-10-CM

## 2020-07-25 DIAGNOSIS — R0602 Shortness of breath: Secondary | ICD-10-CM

## 2020-07-25 MED ORDER — BENAZEPRIL HCL 10 MG PO TABS: 10.0000 mg | ORAL_TABLET | Freq: Every day | ORAL | 1 refills | Status: DC

## 2020-07-25 MED ORDER — NITROGLYCERIN 0.4 MG SL SUBL: SUBLINGUAL_TABLET | SUBLINGUAL | 3 refills | Status: DC

## 2020-07-25 MED ORDER — ROSUVASTATIN CALCIUM 5 MG PO TABS: 5.0000 mg | ORAL_TABLET | Freq: Every day | ORAL | 1 refills | Status: DC

## 2020-07-25 MED ORDER — OMEPRAZOLE 20 MG PO CPDR: 20.0000 mg | DELAYED_RELEASE_CAPSULE | Freq: Every day | ORAL | 1 refills | Status: DC

## 2020-07-25 MED ORDER — PRASUGREL HCL 10 MG PO TABS: 10.0000 mg | ORAL_TABLET | Freq: Every day | ORAL | 1 refills | Status: DC

## 2020-07-25 MED ORDER — FUROSEMIDE 80 MG PO TABS: 80.0000 mg | ORAL_TABLET | Freq: Every day | ORAL | 1 refills | Status: DC

## 2020-07-25 MED ORDER — POTASSIUM CHLORIDE CRYS ER 20 MEQ PO TBCR: 20.0000 meq | EXTENDED_RELEASE_TABLET | Freq: Every day | ORAL | 1 refills | Status: DC

## 2020-07-25 MED ORDER — AMLODIPINE BESYLATE 10 MG PO TABS: 10.0000 mg | ORAL_TABLET | Freq: Every day | ORAL | 1 refills | Status: DC

## 2020-07-25 MED ORDER — EZETIMIBE 10 MG PO TABS: 10.0000 mg | ORAL_TABLET | Freq: Every day | ORAL | 1 refills | Status: DC

## 2020-07-25 MED ORDER — OMEGA-3-ACID ETHYL ESTERS 1 G PO CAPS: 2.0000 g | ORAL_CAPSULE | Freq: Two times a day (BID) | ORAL | 1 refills | Status: DC

## 2020-07-25 MED ORDER — ISOSORBIDE MONONITRATE ER 30 MG PO TB24: 30.0000 mg | ORAL_TABLET | Freq: Every day | ORAL | 1 refills | Status: DC

## 2020-07-25 MED ORDER — METOPROLOL SUCCINATE ER 50 MG PO TB24: 50.0000 mg | ORAL_TABLET | Freq: Every day | ORAL | 1 refills | Status: DC

## 2020-07-25 NOTE — Patient Instructions (Addendum)
Medication Instructions:  Continue all current medications.  Labwork:  CBC, BMET, LIPIDS, TSH, HgA1c - orders given today.  Office will contact with results via phone or letter.    Testing/Procedures:  Your physician has requested that you have a lexiscan myoview. For further information please visit [email or web address] Please follow instruction sheet, as given.  Your physician has requested that you have an echocardiogram. Echocardiography is a painless test that uses sound waves to create images of your heart. It provides your doctor with information about the size and shape of your heart and how well your heart's chambers and valves are working. This procedure takes approximately one hour. There are no restrictions for this procedure. Your physician has requested that you have a carotid duplex. This test is an ultrasound of the carotid arteries in your neck. It looks at blood flow through these arteries that supply the brain with blood. Allow one hour for this exam. There are no restrictions or special instructions.  Office will contact with results via phone or letter.    Follow-Up: 6-8 weeks   Any Other Special Instructions Will Be Listed Below (If Applicable).  If you need a refill on your cardiac medications before your next appointment, please call your pharmacy.

## 2020-07-29 ENCOUNTER — Telehealth: Payer: Self-pay | Admitting: Family Medicine

## 2020-07-29 NOTE — Telephone Encounter (Signed)
Pt aware that we wouldn't refer her to orthopaedic and that she could do a self referral - doesn't have pcp at this time

## 2020-07-29 NOTE — Telephone Encounter (Signed)
Didn't see this mentioned in last appt notes will clarify with provider

## 2020-07-29 NOTE — Telephone Encounter (Signed)
Is the patient requesting a referral to Dr. Romeo Apple?  I do not remember discussing that with her.

## 2020-07-29 NOTE — Telephone Encounter (Signed)
Patient called stating that on her last visit with Mardelle Matte she had requested a referral to  Miguel Dibble, MD Specialties and/or Subspecialties Orthopedic Surgery

## 2020-08-05 ENCOUNTER — Encounter (HOSPITAL_COMMUNITY): Payer: Medicaid Other

## 2020-08-05 ENCOUNTER — Ambulatory Visit (HOSPITAL_COMMUNITY): Payer: Medicaid Other

## 2020-08-23 ENCOUNTER — Telehealth: Payer: Self-pay | Admitting: Family Medicine

## 2020-08-26 NOTE — Progress Notes (Signed)
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 of increased dyspnea on exertion last visit.  Stated sometimes even when sitting still she had to take deep breaths to feel like she was oxygenating well.  Also noted upon auscultation a 2 out of 6 systolic ejection murmur at best heard at left upper sternal border.  Recent repeat echocardiogram 08/21/2020 at [facility]: EF 55% G2 DD, moderately dilated LA.  6.  Fatigue Complaining of significant fatigue.  Has not had blood work in quite some time due to not having a PCP.  Recent labs at [facility]  08/23/2020: Glucose of 104, BUN 17, creatinine 1.05, GFR 56, sodium 137, potassium 4.6, chloride 103, CO2 22, calcium 9.4.  CBC was unremarkable, hemoglobin A1c was 5.9%   Medication Adjustments/Labs and Tests Ordered: Current medicines are reviewed at length with the patient today.  Concerns regarding medicines are outlined above.   Disposition: Follow-up with Dr. Harl Bowie or APP 6 months  Signed, Levell July, NP 08/27/2020 9:33 AM    [facility] [place], [place], [place] [address] Phone: [phone]; Fax: [phone]

## 2020-08-27 ENCOUNTER — Ambulatory Visit: Payer: Medicaid Other

## 2020-08-27 ENCOUNTER — Ambulatory Visit (INDEPENDENT_AMBULATORY_CARE_PROVIDER_SITE_OTHER): Payer: Medicaid Other | Admitting: Family Medicine

## 2020-08-27 ENCOUNTER — Other Ambulatory Visit: Payer: Self-pay | Admitting: Family Medicine

## 2020-08-27 ENCOUNTER — Ambulatory Visit (INDEPENDENT_AMBULATORY_CARE_PROVIDER_SITE_OTHER): Payer: Medicaid Other

## 2020-08-27 ENCOUNTER — Other Ambulatory Visit: Payer: Self-pay

## 2020-08-27 ENCOUNTER — Encounter: Payer: Self-pay | Admitting: Family Medicine

## 2020-08-27 VITALS — BP 148/80 | HR 74 | Ht 63.0 in | Wt 202.2 lb

## 2020-08-27 DIAGNOSIS — I2510 Atherosclerotic heart disease of native coronary artery without angina pectoris: Secondary | ICD-10-CM | POA: Diagnosis not present

## 2020-08-27 DIAGNOSIS — I6523 Occlusion and stenosis of bilateral carotid arteries: Secondary | ICD-10-CM

## 2020-08-27 DIAGNOSIS — E785 Hyperlipidemia, unspecified: Secondary | ICD-10-CM | POA: Diagnosis not present

## 2020-08-27 DIAGNOSIS — R5383 Other fatigue: Secondary | ICD-10-CM

## 2020-08-27 DIAGNOSIS — I10 Essential (primary) hypertension: Secondary | ICD-10-CM | POA: Diagnosis not present

## 2020-08-27 DIAGNOSIS — R0609 Other forms of dyspnea: Secondary | ICD-10-CM

## 2020-08-27 DIAGNOSIS — R0600 Dyspnea, unspecified: Secondary | ICD-10-CM

## 2020-08-27 MED ORDER — OMEGA 3 1000 MG PO CAPS: 1.0000 | ORAL_CAPSULE | Freq: Two times a day (BID) | ORAL | Status: AC

## 2020-08-27 NOTE — Patient Instructions (Addendum)
Medication Instructions:   Lovaza removed from medication list.   Please begin Fish Oil - Omega 3 1,000mg  twice a day.  Labwork: none  Testing/Procedures: none  Follow-Up: 6 months   Any Other Special Instructions Will Be Listed Below (If Applicable).  If you need a refill on your cardiac medications before your next appointment, please call your pharmacy.

## 2020-09-03 ENCOUNTER — Telehealth: Payer: Self-pay | Admitting: *Deleted

## 2020-09-03 DIAGNOSIS — I6523 Occlusion and stenosis of bilateral carotid arteries: Secondary | ICD-10-CM

## 2020-09-03 NOTE — Telephone Encounter (Signed)
Lesle Chris, LPN  98/42/1031 3:58 PM EST Back to Top    Notified, no pcp yet. She agrees to referral.

## 2020-09-03 NOTE — Telephone Encounter (Signed)
-----   Message from Netta Neat., NP sent at 08/31/2020  8:46 PM EST ----- Please call the patient and let her know the narrowing in her carotid arteries is a little worse on the left than on the prior study in 2018. Back in 2018 both carotid arteries were around 40-59% narrowed. On this study the right carotid is about the same as with was in 2018. However the left has become more narrowed at 60-79%. Tell her we probably need to send her to Vascular specialist in [place] to talk with them and have them evaluate her. If she agrees, go ahead and make the the referral . Thanks

## 2020-09-06 ENCOUNTER — Ambulatory Visit: Payer: Medicaid Other | Admitting: Family Medicine

## 2020-09-10 ENCOUNTER — Telehealth: Payer: Self-pay | Admitting: Family Medicine

## 2020-09-10 NOTE — Telephone Encounter (Signed)
Advised that our office do not prescribed depression or anxiety medications and that she could go to Urgent Care until she is able to see her new PCP. Verbalized understanding

## 2020-09-10 NOTE — Telephone Encounter (Signed)
Patient called stating that she cannot quit crying. She is worried about her upcoming appointment with [facility].  Patient is wanting to know if an antidepressant can be called in for her.

## 2020-09-12 ENCOUNTER — Other Ambulatory Visit: Payer: Self-pay

## 2020-09-12 ENCOUNTER — Encounter: Payer: Self-pay | Admitting: Internal Medicine

## 2020-09-12 ENCOUNTER — Ambulatory Visit: Payer: Medicaid Other | Admitting: Internal Medicine

## 2020-09-12 VITALS — BP 122/83 | HR 85 | Temp 98.0°F | Resp 18 | Ht 62.5 in | Wt 200.1 lb

## 2020-09-12 DIAGNOSIS — Z7689 Persons encountering health services in other specified circumstances: Secondary | ICD-10-CM

## 2020-09-12 DIAGNOSIS — R519 Headache, unspecified: Secondary | ICD-10-CM

## 2020-09-12 DIAGNOSIS — E785 Hyperlipidemia, unspecified: Secondary | ICD-10-CM | POA: Diagnosis not present

## 2020-09-12 DIAGNOSIS — I2510 Atherosclerotic heart disease of native coronary artery without angina pectoris: Secondary | ICD-10-CM | POA: Diagnosis not present

## 2020-09-12 DIAGNOSIS — I739 Peripheral vascular disease, unspecified: Secondary | ICD-10-CM

## 2020-09-12 DIAGNOSIS — E039 Hypothyroidism, unspecified: Secondary | ICD-10-CM

## 2020-09-12 DIAGNOSIS — Z1231 Encounter for screening mammogram for malignant neoplasm of breast: Secondary | ICD-10-CM

## 2020-09-12 DIAGNOSIS — Z5321 Procedure and treatment not carried out due to patient leaving prior to being seen by health care provider: Secondary | ICD-10-CM | POA: Insufficient documentation

## 2020-09-12 DIAGNOSIS — I10 Essential (primary) hypertension: Secondary | ICD-10-CM

## 2020-09-12 DIAGNOSIS — G4700 Insomnia, unspecified: Secondary | ICD-10-CM

## 2020-09-12 DIAGNOSIS — I255 Ischemic cardiomyopathy: Secondary | ICD-10-CM

## 2020-09-12 DIAGNOSIS — Z124 Encounter for screening for malignant neoplasm of cervix: Secondary | ICD-10-CM

## 2020-09-12 DIAGNOSIS — R569 Unspecified convulsions: Secondary | ICD-10-CM

## 2020-09-12 DIAGNOSIS — Z9861 Coronary angioplasty status: Secondary | ICD-10-CM

## 2020-09-12 DIAGNOSIS — F419 Anxiety disorder, unspecified: Secondary | ICD-10-CM

## 2020-09-12 DIAGNOSIS — J449 Chronic obstructive pulmonary disease, unspecified: Secondary | ICD-10-CM

## 2020-09-12 MED ORDER — ZOLPIDEM TARTRATE 5 MG PO TABS: 5.0000 mg | ORAL_TABLET | Freq: Every evening | ORAL | 2 refills | Status: DC | PRN

## 2020-09-12 MED ORDER — FLUOXETINE HCL 40 MG PO CAPS: 40.0000 mg | ORAL_CAPSULE | Freq: Every day | ORAL | 1 refills | Status: DC

## 2020-09-12 NOTE — Assessment & Plan Note (Signed)
Continue levothyroxine 75 MCG daily Check TSH and free T4

## 2020-09-12 NOTE — Patient Instructions (Signed)
Please start taking Fluoxetine and Zolpidem as prescribed.  Please continue to take other medications as prescribed.  Please get fasting blood tests done soon.  Please continue to follow up with Cardiologist and Vascular surgeon as scheduled.  Please follow DASH diet and perform moderate exercise/walking at least 150 mins/week or as tolerated.  DASH stands for Dietary Approaches to Stop Hypertension. The DASH diet is a healthy-eating plan designed to help treat or prevent high blood pressure (hypertension).  The DASH diet includes foods that are rich in potassium, calcium and magnesium. These nutrients help control blood pressure. The diet limits foods that are high in sodium, saturated fat and added sugars.  Studies have shown that the DASH diet can lower blood pressure in as little as two weeks. The diet can also lower low-density lipoprotein (LDL or "bad") cholesterol levels in the blood. High blood pressure and high LDL cholesterol levels are two major risk factors for heart disease and stroke.    DASH diet: Recommended servings The DASH diet provides daily and weekly nutritional goals. The number of servings you should have depends on your daily calorie needs.  Here's a look at the recommended servings from each food group for a 2,000-calorie-a-day DASH diet:  Grains: 6 to 8 servings a day. One serving is one slice bread, 1 ounce dry cereal, or 1/2 cup cooked cereal, rice or pasta. Vegetables: 4 to 5 servings a day. One serving is 1 cup raw leafy green vegetable, 1/2 cup cut-up raw or cooked vegetables, or 1/2 cup vegetable juice. Fruits: 4 to 5 servings a day. One serving is one medium fruit, 1/2 cup fresh, frozen or canned fruit, or 1/2 cup fruit juice. Fat-free or low-fat dairy products: 2 to 3 servings a day. One serving is 1 cup milk or yogurt, or 1 1/2 ounces cheese. Lean meats, poultry and fish: six 1-ounce servings or fewer a day. One serving is 1 ounce cooked meat, poultry or  fish, or 1 egg. Nuts, seeds and legumes: 4 to 5 servings a week. One serving is 1/3 cup nuts, 2 tablespoons peanut butter, 2 tablespoons seeds, or 1/2 cup cooked legumes (dried beans or peas). Fats and oils: 2 to 3 servings a day. One serving is 1 teaspoon soft margarine, 1 teaspoon vegetable oil, 1 tablespoon mayonnaise or 2 tablespoons salad dressing. Sweets and added sugars: 5 servings or fewer a week. One serving is 1 tablespoon sugar, jelly or jam, 1/2 cup sorbet, or 1 cup lemonade.

## 2020-09-12 NOTE — Assessment & Plan Note (Signed)
Care established Previous chart reviewed History and medications reviewed with the patient 

## 2020-09-12 NOTE — Assessment & Plan Note (Addendum)
On Crestor 5 mg daily Check lipid panel.

## 2020-09-12 NOTE — Assessment & Plan Note (Signed)
BP Readings from Last 1 Encounters:  09/12/20 122/83   Well-controlled with amlodipine, benazepril, metoprolol and Imdur Counseled for compliance with the medications Advised DASH diet and moderate exercise/walking, at least 150 mins/week Follows up with cardiologist

## 2020-09-12 NOTE — Assessment & Plan Note (Signed)
CABG in 2005 On aspirin and Effient On metoprolol and Imdur Uses nitroglycerin as needed for chest pain

## 2020-09-12 NOTE — Assessment & Plan Note (Signed)
On aspirin and statin Has an appointment with vascular surgeon

## 2020-09-12 NOTE — Progress Notes (Signed)
New Patient Office Visit  Subjective:  Patient ID: [name], female    DOB: June 05, 1956  Age: 64 y.o. MRN: [id number]  CC:  Chief Complaint  Patient presents with  . New Patient (Initial Visit)    New patient former dr Luan Pulling pt he prescribed prozac xanax and ambien but she no longer has these and she is feeling anxious and cannot sleep     HPI [name] is a 64 year old female with PMH of CAD s/p multiple stent placement and CABG, ischemic cardiomyopathy, hypertension, hyperlipidemia, COPD, hypothyroidism, seizure disorder, anxiety, depression and insomnia who presents for establishing care.  Patient has been following up with cardiologist for her extensive cardiac history and management.  She has an appointment with vascular surgeon for evaluation of ICA stenosis.  She denies any active chest pain, dyspnea or palpitations currently.  She takes nitroglycerin as needed for angina.  Patient has been very anxious since she has not been able to take her Prozac, Ambien and Xanax since her previous PCP retired.  She mentions depressed mood, decreased concentration, and sleeping difficulty, but denies suicidal ideation.  Patient had colonoscopy in 2013.  Last mammography in 2016.  Patient has had 2 doses of COVID vaccine.  She has had flu vaccine.  Past Medical History:  Diagnosis Date  . Acute renal failure ([place])   . Anemia   . Anxiety   . Arthritis   . Asthma   . Carotid artery disease ([place])    a. Duplex 50-69% BICA 03/2014.  Marland Kitchen Chronic back pain   . Collagen vascular disease ([place])   . COPD (chronic obstructive pulmonary disease) ([place])   . Coronary artery disease    a. s/p prior CABG 2005. b. BMS 02/2009 to VG-PDA. c. NSTEMI 08/2010 s/p PTCA/DES to SVG-PDA in previously stented area. d. DES to SVG to RCA on 05/24/14  c. LHC 06/04/2015 w/ no sig change in her anatomy and Rx managment  . DDD (degenerative disc disease)   . Depression   . Diverticulitis    4 documented  episodes by CT; most recent April 2012  . DIVERTICULITIS OF COLON 07/14/2010   Qualifier: Diagnosis of  By: Westly Pam   . GERD (gastroesophageal reflux disease)   . History of blood transfusion   . HTN (hypertension)   . Hyperlipidemia   . Hypertension   . Hypothyroidism   . Ischemic cardiomyopathy    a. EF 40% in 2012 by cath. b. improved to 50-55% by cath 05/2014  c. EF 35-40% by [facility] 05/2015  . Kidney stones   . Seizures ([place])   . Sinus bradycardia   . Sleep apnea    a. Mild-to-moderate obstructive sleep apnea diagnosed in April 2012 by sleep study.  . Tension headache     Past Surgical History:  Procedure Laterality Date  . APPENDECTOMY  ~ 1970  . CARDIAC CATHETERIZATION  Jan 2012   patent stents in RCA graft; CAD stable  . CARDIAC CATHETERIZATION N/A 06/03/2015   Procedure: Left Heart Cath and Cors/Grafts Angiography;  Surgeon: Belva Crome, MD;  Location: [facility];  Service: Cardiovascular;  Laterality: N/A;  . CARPAL TUNNEL WITH CUBITAL TUNNEL Right 2001  . CORONARY ANGIOPLASTY WITH STENT PLACEMENT  2011   Promus Premier DES, 3.5 mm x 12 mm, for 99% proximal stent ISR in the SVG-RPDA   . CORONARY ANGIOPLASTY WITH STENT PLACEMENT  05/24/2014  . CORONARY ARTERY BYPASS GRAFT  2005  LIMA to AL, SVG to LAD, SVG to Cx, SVG-PDA  . KNEE ARTHROSCOPY W/ ACL RECONSTRUCTION Left 2001  . LAPAROSCOPIC CHOLECYSTECTOMY  2010  . LEFT HEART CATHETERIZATION WITH CORONARY/GRAFT ANGIOGRAM N/A 05/24/2014   Procedure: LEFT HEART CATHETERIZATION WITH Beatrix Fetters;  Surgeon: Leonie Man, MD; EF 50-55%, LAD & D1 100%, LIMA-D1 OK, CFX OK, OM1 & OM2 100%, lat OM branch 100%, RCA 100%; pSVG-RPDA 99% rx w/ scoring balloon PTCA & 3.5 x 12 mm Promus DES; SVG-LAD, SVG-OM known occluded      . TUBAL LIGATION  ~ 1989  . VAGINAL HYSTERECTOMY  ~ 1993    Family History  Problem Relation Age of Onset  . Heart attack Mother   . Heart disease Mother   . Breast cancer  Mother   . Heart attack Father   . Heart disease Father   . Colon cancer Neg Hx     Social History   Socioeconomic History  . Marital status: Legally Separated    Spouse name: Not on file  . Number of children: 3  . Years of education: Not on file  . Highest education level: Not on file  Occupational History  . Occupation: disabled  Tobacco Use  . Smoking status: Former Smoker    Packs/day: 0.50    Years: 21.00    Pack years: 10.50    Types: Cigarettes    Start date: 03/19/1993    Quit date: 07/29/2015    Years since quitting: 5.1  . Smokeless tobacco: Never Used  . Tobacco comment: smokes some time/go weeks without smoking  Vaping Use  . Vaping Use: Never used  Substance and Sexual Activity  . Alcohol use: No    Alcohol/week: 0.0 standard drinks  . Drug use: Yes    Types: Cocaine, Marijuana    Comment: last used cocaine 2015  . Sexual activity: Never  Other Topics Concern  . Not on file  Social History Narrative   Lives in [place], [place].    Social Determinants of Health   Financial Resource Strain: Not on file  Food Insecurity: Not on file  Transportation Needs: Not on file  Physical Activity: Not on file  Stress: Not on file  Social Connections: Not on file  Intimate Partner Violence: Not on file    ROS Review of Systems  Constitutional: Negative for chills and fever.  HENT: Negative for congestion, sinus pressure, sinus pain and sore throat.   Eyes: Negative for pain and discharge.  Respiratory: Negative for cough and shortness of breath.   Cardiovascular: Positive for chest pain (Intermittently) and palpitations (Intermittent (when anxious)).  Gastrointestinal: Negative for abdominal pain, constipation, diarrhea, nausea and vomiting.  Endocrine: Negative for polydipsia and polyuria.  Genitourinary: Negative for dysuria and hematuria.  Musculoskeletal: Negative for neck pain and neck stiffness.  Skin: Negative for rash.  Neurological: Positive for  headaches. Negative for dizziness, syncope and weakness.  Psychiatric/Behavioral: Positive for agitation, decreased concentration, dysphoric mood and sleep disturbance. Negative for suicidal ideas. The patient is nervous/anxious.     Objective:   Today's Vitals: BP 122/83 (BP Location: Right Arm, Patient Position: Sitting, Cuff Size: Normal)   Pulse 85   Temp 98 F (36.7 C) (Oral)   Resp 18   Ht 5' 2.5" (1.588 m)   Wt 200 lb 1.9 oz (90.8 kg)   SpO2 97%   BMI 36.02 kg/m   Physical Exam Vitals reviewed.  Constitutional:      General: She is not in acute  distress.    Appearance: She is not diaphoretic.  HENT:     Head: Normocephalic and atraumatic.     Nose: Nose normal.     Mouth/Throat:     Mouth: Mucous membranes are moist.  Eyes:     General: No scleral icterus.    Extraocular Movements: Extraocular movements intact.     Pupils: Pupils are equal, round, and reactive to light.  Cardiovascular:     Rate and Rhythm: Normal rate and regular rhythm.     Pulses: Normal pulses.     Heart sounds: Normal heart sounds. No murmur heard.   Pulmonary:     Breath sounds: Normal breath sounds. No wheezing or rales.  Abdominal:     Palpations: Abdomen is soft.     Tenderness: There is no abdominal tenderness.  Musculoskeletal:     Cervical back: Neck supple. No tenderness.     Right lower leg: No edema.     Left lower leg: No edema.  Skin:    General: Skin is warm.     Findings: No rash.  Neurological:     General: No focal deficit present.     Mental Status: She is alert and oriented to person, place, and time.     Sensory: No sensory deficit.     Motor: No weakness.  Psychiatric:        Behavior: Behavior normal.     Comments: Anxious      Assessment & Plan:   Problem List Items Addressed This Visit      Encounter to establish care - Primary   Care established Previous chart reviewed History and medications reviewed with the patient     Relevant Orders  CBC  with Differential  CMP14+EGFR  Hemoglobin A1c  Lipid panel  TSH + free T4  Vitamin D (25 hydroxy)    Cardiovascular and Mediastinum   Essential hypertension (Chronic)    BP Readings from Last 1 Encounters:  09/12/20 122/83   Well-controlled with amlodipine, benazepril, metoprolol and Imdur Counseled for compliance with the medications Advised DASH diet and moderate exercise/walking, at least 150 mins/week Follows up with cardiologist       PVD- 50-69% bilat ICA  (Chronic)    On aspirin and statin Has an appointment with vascular surgeon      CAD S/P multiple PCI after CABG (Chronic)    CABG in 2005 On aspirin and Effient On metoprolol and Imdur Uses nitroglycerin as needed for chest pain      Ischemic cardiomyopathy    Follows up with cardiologist On metoprolol, benazepril and Imdur Plan to have a Lexiscan according to the previous note from Cardiologist         Respiratory   Chronic obstructive pulmonary disease (COPD) ([place])    Quit smoking in 2016 Uses Albuterol inhaler PRN        Endocrine   Hypothyroidism    Continue levothyroxine 75 MCG daily Check TSH and free T4        Other   Hyperlipidemia with target LDL less than 70 (Chronic)    On Crestor 5 mg daily Check lipid panel.      Headache    Tylenol PRN for now If resistant, will discuss about migraine PPX treatment      Relevant Medications   FLUoxetine (PROZAC) 40 MG capsule   Seizure ([place])    Well controlled with Keppra No episodes of seizures for about 5 years      Relevant Medications  zolpidem (AMBIEN) 5 MG tablet      Anxiety    With depression Restarted Prozac      Relevant Medications   FLUoxetine (PROZAC) 40 MG capsule   Insomnia    Was on Ambien, restarted Wanted to take Xanax with it, advised to avoid for now. Will check response with Ambien first.      Relevant Medications   zolpidem (AMBIEN) 5 MG tablet    Other Visit Diagnoses    Routine cervical smear        Relevant Orders   Ambulatory referral to Obstetrics / Gynecology   Screening mammogram for breast cancer       Relevant Orders   MM 3D SCREEN BREAST BILATERAL      Outpatient Encounter Medications as of 09/12/2020  Medication Sig  . amLODipine (NORVASC) 10 MG tablet Take 1 tablet (10 mg total) by mouth daily.  Marland Kitchen aspirin 325 MG tablet Take 162 mg by mouth daily.  . benazepril (LOTENSIN) 10 MG tablet Take 1 tablet (10 mg total) by mouth daily.  . cetirizine (ZYRTEC ALLERGY) 10 MG tablet Take 1 tablet (10 mg total) by mouth daily.  Marland Kitchen ezetimibe (ZETIA) 10 MG tablet Take 1 tablet (10 mg total) by mouth daily.  . furosemide (LASIX) 80 MG tablet Take 1 tablet (80 mg total) by mouth daily.  . isosorbide mononitrate (IMDUR) 60 MG 24 hr tablet Take 60 mg by mouth daily.  Marland Kitchen levETIRAcetam (KEPPRA) 250 MG tablet Take 250 mg by mouth 2 (two) times daily.  Marland Kitchen levothyroxine (SYNTHROID, LEVOTHROID) 75 MCG tablet Take 75 mcg by mouth daily before breakfast.  . metoprolol succinate (TOPROL-XL) 50 MG 24 hr tablet Take 1 tablet (50 mg total) by mouth daily.  . Multiple Vitamin (MULTIVITAMIN WITH MINERALS) TABS tablet Take 1 tablet by mouth 2 (two) times daily.  . nitroGLYCERIN (NITROSTAT) 0.4 MG SL tablet DISSOLVE 1 TABLET UNDER TONGUE EVERY 5 MINUTES UP TO 15 MIN FOR CHESTPAIN. IF NO RELIEF CALL 911.  . Omega 3 1000 MG CAPS Take 1 capsule (1,000 mg total) by mouth 2 (two) times daily.  . potassium chloride SA (KLOR-CON) 20 MEQ tablet Take 1 tablet (20 mEq total) by mouth daily.  . prasugrel (EFFIENT) 10 MG TABS tablet Take 1 tablet (10 mg total) by mouth daily.  . rosuvastatin (CRESTOR) 5 MG tablet Take 1 tablet (5 mg total) by mouth daily.  Marland Kitchen albuterol (PROVENTIL) (2.5 MG/3ML) 0.083% nebulizer solution Take 2.5 mg by nebulization every 6 (six) hours as needed for wheezing or shortness of breath. (Patient not taking: Reported on 09/12/2020)  . albuterol (VENTOLIN HFA) 108 (90 Base) MCG/ACT inhaler Inhale 2  puffs into the lungs every 6 (six) hours as needed for wheezing or shortness of breath. (Patient not taking: Reported on 09/12/2020)  . FLUoxetine (PROZAC) 40 MG capsule Take 1 capsule (40 mg total) by mouth daily.  . fluticasone (FLONASE) 50 MCG/ACT nasal spray Place 1 spray into both nostrils daily for 14 days. (Patient not taking: Reported on 09/12/2020)  . omeprazole (PRILOSEC) 20 MG capsule Take 1 capsule (20 mg total) by mouth daily. (Patient not taking: Reported on 09/12/2020)  . zolpidem (AMBIEN) 5 MG tablet Take 1 tablet (5 mg total) by mouth at bedtime as needed for sleep.  . [DISCONTINUED] meclizine (ANTIVERT) 25 MG tablet Take 1 tablet (25 mg total) by mouth 3 (three) times daily as needed for dizziness. (Patient not taking: Reported on 09/12/2020)   No facility-administered encounter medications on  file as of 09/12/2020.    Follow-up: Return in about 3 months (around 12/11/2020).   Lindell Spar, MD

## 2020-09-12 NOTE — Assessment & Plan Note (Signed)
Was on Ambien, restarted Wanted to take Xanax with it, advised to avoid for now. Will check response with Ambien first.

## 2020-09-12 NOTE — Assessment & Plan Note (Signed)
Tylenol PRN for now If resistant, will discuss about migraine PPX treatment

## 2020-09-12 NOTE — Assessment & Plan Note (Signed)
Well controlled with Keppra No episodes of seizures for about 5 years

## 2020-09-12 NOTE — Assessment & Plan Note (Signed)
Follows up with cardiologist On metoprolol, benazepril and Imdur Plan to have a Lexiscan according to the previous note from Cardiologist

## 2020-09-12 NOTE — Assessment & Plan Note (Signed)
Quit smoking in 2016 Uses Albuterol inhaler PRN

## 2020-09-12 NOTE — Assessment & Plan Note (Signed)
With depression Restarted Prozac

## 2020-09-20 NOTE — Telephone Encounter (Signed)
error 

## 2020-10-02 ENCOUNTER — Ambulatory Visit (HOSPITAL_COMMUNITY): Payer: Medicaid Other

## 2020-10-07 ENCOUNTER — Ambulatory Visit (INDEPENDENT_AMBULATORY_CARE_PROVIDER_SITE_OTHER): Payer: Medicaid Other | Admitting: Vascular Surgery

## 2020-10-07 ENCOUNTER — Other Ambulatory Visit: Payer: Self-pay

## 2020-10-07 ENCOUNTER — Encounter: Payer: Self-pay | Admitting: Vascular Surgery

## 2020-10-07 VITALS — BP 139/83 | HR 63 | Temp 97.9°F | Resp 14 | Ht 63.0 in | Wt 196.0 lb

## 2020-10-07 DIAGNOSIS — I6523 Occlusion and stenosis of bilateral carotid arteries: Secondary | ICD-10-CM | POA: Diagnosis not present

## 2020-10-07 NOTE — Progress Notes (Signed)
[facility] [place]  Patient name: [name] MRN: [id number] DOB: [birth year] Sex: female  REASON FOR CONSULT: Evaluation asymptomatic carotid disease  HPI: [name] is a 65 y.o. female, here today for discussion of asymptomatic carotid disease on duplex evaluation.  She has had no history of amaurosis fugax, aphasia, transient ischemic attack or stroke.  She does have a very strong family history of stroke and cardiac disease.  She has been followed with noninvasive studies dating back at least 6 years with moderate disease.  She is status post coronary artery bypass grafting at a very [place] age in 7.  She has never smoked.  She does take daily aspirin.  Past Medical History:  Diagnosis Date  . Acute renal failure (HCC)   . Anemia   . Anxiety   . Arthritis   . Asthma   . Carotid artery disease (HCC)    a. Duplex 50-69% [facility] 03/2014.  Marland Kitchen Chronic back pain   . Collagen vascular disease (HCC)   . COPD (chronic obstructive pulmonary disease) (HCC)   . Coronary artery disease    a. s/p prior CABG 2005. b. BMS 02/2009 to VG-PDA. c. NSTEMI 08/2010 s/p PTCA/DES to SVG-PDA in previously stented area. d. DES to SVG to RCA on 05/24/14  c. LHC 06/04/2015 w/ no sig change in her anatomy and Rx managment  . DDD (degenerative disc disease)   . Depression   . Diverticulitis    4 documented episodes by CT; most recent April 2012  . DIVERTICULITIS OF COLON 07/14/2010   Qualifier: Diagnosis of  By: [place]   . GERD (gastroesophageal reflux disease)   . History of blood transfusion   . HTN (hypertension)   . Hyperlipidemia   . Hypertension   . Hypothyroidism   . Ischemic cardiomyopathy    a. EF 40% in 2012 by cath. b. improved to 50-55% by cath 05/2014  c. [facility] 35-40% [facility]  . Kidney stones   . Seizures (HCC)   . Sinus bradycardia   . Sleep apnea    a. Mild-to-moderate obstructive sleep apnea diagnosed in April 2012 by sleep study.  .  Tension headache     Family History  Problem Relation Age of Onset  . Heart attack Mother   . Heart disease Mother   . Breast cancer Mother   . Heart attack Father   . Heart disease Father   . Colon cancer Neg Hx     SOCIAL HISTORY: Social History   Socioeconomic History  . Marital status: Legally Separated    Spouse name: Not on file  . Number of children: 3  . Years of education: Not on file  . Highest education level: Not on file  Occupational History  . Occupation: disabled  Tobacco Use  . Smoking status: Former Smoker    Packs/day: 0.50    Years: 21.00    Pack years: 10.50    Types: Cigarettes    Start date: 03/19/1993    Quit date: 07/29/2015    Years since quitting: 5.1  . Smokeless tobacco: Never Used  . Tobacco comment: smokes some time/go weeks without smoking  Vaping Use  . Vaping Use: Never used  Substance and Sexual Activity  . Alcohol use: No    Alcohol/week: 0.0 standard drinks  . Drug use: Yes    Types: Cocaine, Marijuana    Comment: last used cocaine 2015  . Sexual activity: Never  Other Topics Concern  . Not on file  Social History Narrative   Lives in [place], [place].    Social Determinants of Health   Financial Resource Strain: Not on file  Food Insecurity: Not on file  Transportation Needs: Not on file  Physical Activity: Not on file  Stress: Not on file  Social Connections: Not on file  Intimate Partner Violence: Not on file    Allergies  Allergen Reactions  . Pneumococcal Vaccines Anxiety, Palpitations and Cough  . Adhesive [Tape] Itching and Other (See Comments)    Blisters (pls use paper tape)  . Crestor [Rosuvastatin] Other (See Comments)    Muscle pain  . Lipitor [Atorvastatin] Other (See Comments)    Cramps  . Morphine And Related Itching    Pt states she takes with benadryl    Current Outpatient Medications  Medication Sig Dispense Refill  . amLODipine (NORVASC) 10 MG tablet Take 1 tablet (10 mg total) by mouth daily. 90  tablet 1  . aspirin 325 MG tablet Take 162 mg by mouth daily.    . benazepril (LOTENSIN) 10 MG tablet Take 1 tablet (10 mg total) by mouth daily. 90 tablet 1  . cetirizine (ZYRTEC ALLERGY) 10 MG tablet Take 1 tablet (10 mg total) by mouth daily. 30 tablet 0  . ezetimibe (ZETIA) 10 MG tablet Take 1 tablet (10 mg total) by mouth daily. 90 tablet 1  . FLUoxetine (PROZAC) 40 MG capsule Take 1 capsule (40 mg total) by mouth daily. 90 capsule 1  . furosemide (LASIX) 80 MG tablet Take 1 tablet (80 mg total) by mouth daily. 90 tablet 1  . isosorbide mononitrate (IMDUR) 60 MG 24 hr tablet Take 60 mg by mouth daily.    Marland Kitchen levETIRAcetam (KEPPRA) 250 MG tablet Take 250 mg by mouth 2 (two) times daily.    Marland Kitchen levothyroxine (SYNTHROID, LEVOTHROID) 75 MCG tablet Take 75 mcg by mouth daily before breakfast.    . metoprolol succinate (TOPROL-XL) 50 MG 24 hr tablet Take 1 tablet (50 mg total) by mouth daily. 90 tablet 1  . Multiple Vitamin (MULTIVITAMIN WITH MINERALS) TABS tablet Take 1 tablet by mouth 2 (two) times daily. 25 tablet 4  . nitroGLYCERIN (NITROSTAT) 0.4 MG SL tablet DISSOLVE 1 TABLET UNDER TONGUE EVERY 5 MINUTES UP TO 15 MIN FOR CHESTPAIN. IF NO RELIEF CALL 911. 25 tablet 3  . Omega 3 1000 MG CAPS Take 1 capsule (1,000 mg total) by mouth 2 (two) times daily.    Marland Kitchen omeprazole (PRILOSEC) 20 MG capsule Take 1 capsule (20 mg total) by mouth daily. 90 capsule 1  . potassium chloride SA (KLOR-CON) 20 MEQ tablet Take 1 tablet (20 mEq total) by mouth daily. 90 tablet 1  . prasugrel (EFFIENT) 10 MG TABS tablet Take 1 tablet (10 mg total) by mouth daily. 90 tablet 1  . rosuvastatin (CRESTOR) 5 MG tablet Take 1 tablet (5 mg total) by mouth daily. 90 tablet 1  . zolpidem (AMBIEN) 5 MG tablet Take 1 tablet (5 mg total) by mouth at bedtime as needed for sleep. 30 tablet 2  . albuterol (PROVENTIL) (2.5 MG/3ML) 0.083% nebulizer solution Take 2.5 mg by nebulization every 6 (six) hours as needed for wheezing or shortness  of breath. (Patient not taking: No sig reported)    . albuterol (VENTOLIN HFA) 108 (90 Base) MCG/ACT inhaler Inhale 2 puffs into the lungs every 6 (six) hours as needed for wheezing or shortness of breath. (Patient not taking: No sig reported)  No current facility-administered medications for this visit.    REVIEW OF SYSTEMS:  [X]  denotes positive finding, [ ]  denotes negative finding Cardiac  Comments:  Chest pain or chest pressure:    Shortness of breath upon exertion:    Short of breath when lying flat:    Irregular heart rhythm:        Vascular    Pain in calf, thigh, or hip brought on by ambulation:    Pain in feet at night that wakes you up from your sleep:     Blood clot in your veins:    Leg swelling:         Pulmonary    Oxygen at home:    Productive cough:     Wheezing:         Neurologic    Sudden weakness in arms or legs:     Sudden numbness in arms or legs:     Sudden onset of difficulty speaking or slurred speech:    Temporary loss of vision in one eye:     Problems with dizziness:         Gastrointestinal    Blood in stool:     Vomited blood:         Genitourinary    Burning when urinating:     Blood in urine:        Psychiatric    Major depression:         Hematologic    Bleeding problems:    Problems with blood clotting too easily:        Skin    Rashes or ulcers:        Constitutional    Fever or chills:      PHYSICAL EXAM: Vitals:   10/07/20 1023  BP: 139/83  Pulse: 63  Resp: 14  Temp: 97.9 F (36.6 C)  TempSrc: Other (Comment)  SpO2: 95%  Weight: 196 lb (88.9 kg)  Height: 5\' 3"  (1.6 m)    GENERAL: The patient is a well-nourished female, in no acute distress. The vital signs are documented above. VASCULAR: Carotid arteries without bruits bilaterally.  2+ radial pulses bilaterally. PULMONARY: There is good air exchange ABDOMEN: Soft and non-tender  MUSCULOSKELETAL: There are no major deformities or cyanosis. NEUROLOGIC: No  focal weakness or paresthesias are detected. SKIN: There are no ulcers or rashes noted. PSYCHIATRIC: The patient has a normal affect.  DATA:   Noninvasive studies from 08/27/2020 were reviewed with the patient.  This reveals stenosis on the right at 40 to 59%.  There is no change since the most recent study in 2018.  On the left her predicted stenosis is 60 to 79% and this has progressed since 2008.  Her systolic velocities are 269 cm/s and end-diastolic are 87 cm/s.  MEDICAL ISSUES:  Asymptomatic moderate to severe left carotid stenosis.  The patient is right-handed.  I discussed symptoms of carotid disease with the patient and she knows to report immediately should this occur.  I did explain that she is below the threshold for recommended surgery for asymptomatic disease.  Would recommend duplex at 75-month intervals to rule out any asymptomatic progression.  I did discuss procedure of carotid endarterectomy if this becomes indicated.  We will see her again in 6 months.   Rosetta Posner, MD FACS [facility] [place] [facility] Office phone [phone]

## 2020-10-28 ENCOUNTER — Other Ambulatory Visit: Payer: Self-pay

## 2020-10-28 DIAGNOSIS — I6523 Occlusion and stenosis of bilateral carotid arteries: Secondary | ICD-10-CM

## 2020-10-29 ENCOUNTER — Encounter: Payer: Medicaid Other | Admitting: Obstetrics & Gynecology

## 2020-11-08 ENCOUNTER — Other Ambulatory Visit: Payer: Self-pay | Admitting: Internal Medicine

## 2020-11-08 DIAGNOSIS — G4700 Insomnia, unspecified: Secondary | ICD-10-CM

## 2020-12-05 ENCOUNTER — Other Ambulatory Visit: Payer: Self-pay | Admitting: Internal Medicine

## 2020-12-05 ENCOUNTER — Telehealth: Payer: Self-pay

## 2020-12-05 DIAGNOSIS — G4700 Insomnia, unspecified: Secondary | ICD-10-CM

## 2020-12-05 MED ORDER — ZOLPIDEM TARTRATE 5 MG PO TABS: 5.0000 mg | ORAL_TABLET | Freq: Every evening | ORAL | 2 refills | Status: DC | PRN

## 2020-12-05 NOTE — Telephone Encounter (Signed)
Please send in medication if you would like to refill 

## 2020-12-05 NOTE — Telephone Encounter (Signed)
Patient called need med refill: zolpidem (AMBIEN) 5 MG  Pharmacy: [facility]

## 2020-12-05 NOTE — Telephone Encounter (Signed)
Please advise 

## 2020-12-05 NOTE — Telephone Encounter (Signed)
Refilled. She has an appt coming, she needs to keep it. Thanks.

## 2020-12-11 ENCOUNTER — Ambulatory Visit: Payer: Medicaid Other | Admitting: Internal Medicine

## 2021-01-16 ENCOUNTER — Ambulatory Visit: Payer: Medicaid Other | Admitting: Internal Medicine

## 2021-01-22 ENCOUNTER — Ambulatory Visit: Payer: Medicaid Other | Admitting: Internal Medicine

## 2021-01-31 ENCOUNTER — Other Ambulatory Visit: Payer: Self-pay | Admitting: Internal Medicine

## 2021-02-07 ENCOUNTER — Other Ambulatory Visit: Payer: Self-pay | Admitting: Family Medicine

## 2021-02-07 DIAGNOSIS — R0789 Other chest pain: Secondary | ICD-10-CM

## 2021-02-27 ENCOUNTER — Other Ambulatory Visit: Payer: Self-pay | Admitting: Family Medicine

## 2021-02-27 DIAGNOSIS — I10 Essential (primary) hypertension: Secondary | ICD-10-CM

## 2021-02-28 ENCOUNTER — Ambulatory Visit: Payer: Medicaid Other | Admitting: Cardiology

## 2021-03-10 ENCOUNTER — Other Ambulatory Visit: Payer: Self-pay | Admitting: Internal Medicine

## 2021-03-10 DIAGNOSIS — G4700 Insomnia, unspecified: Secondary | ICD-10-CM

## 2021-06-06 ENCOUNTER — Other Ambulatory Visit: Payer: Self-pay | Admitting: Internal Medicine

## 2021-06-06 ENCOUNTER — Other Ambulatory Visit: Payer: Self-pay | Admitting: Family Medicine

## 2021-06-06 DIAGNOSIS — I2510 Atherosclerotic heart disease of native coronary artery without angina pectoris: Secondary | ICD-10-CM

## 2021-06-15 NOTE — Progress Notes (Signed)
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 increasing intensity and duration or associated symptoms.  In this case we would set her up for cardiac catheterization.  She verbalized understanding.  On today's visit she states she had some episodes of chest pain without associated symptoms or radiation. She states she has been without both her Imdur and NTG. We will refill both medications. We are discontinuing ASA due to starting systemic anticoagulation for new onset atrial fibrillation. She states she is no longer on Effient. She cannot recollect who may have discontinued the medication.  At visit with Dr Posey Pronto in December she was still on the medication per his note along with asa.   2. New onset  atrial fibrillation Patient recently complaining of palpitations, increased  fatigue,and activity intolerance. EKG today shows atrial fibrillation with controlled rate of 74. She is currently on Toprol XL 50 mg po daily. Will start Eliquis 5 mg po bid.  We discussed possible DC cardioversion in the future. She verbalizes understanding.   3. Bilateral carotid artery stenosis Carotid artery duplex study on 08/28/2020: RICA with 40 to 45% stenosis.  LICA 60 to 47%.Annia Belt demonstrated antegrade flow.  Normal flow hemodynamics in bilateral subclavian arteries.  4. Essential hypertension Blood pressure today 128/80.  Continue amlodipine 10 mg daily.  Continue benazepril 10 mg daily.  Continue Lasix 80 mg daily.  Continue Toprol-XL 50 mg daily.  5. Mixed hyperlipidemia Not tolerant of statins.  Had been on Zetia in the past.   Pending results of lipids will consider Repatha. Lipid panel 08/23/2020: Total cholesterol 197, triglycerides 198, HDL 49, LDL 114.  Please refer to lipid clinic for possible initiation of PCSK9 inhibitor.  6.  DOE/shortness of breath Complained of increased dyspnea on exertion last visit. She continues with complaints of DOE/ SOB with exertional fatigue and significant activity intolerance. Please get a follow up echocardiogram.    7.  Fatigue Continues with complaints of significant fatigue.  She states she has been without her Levothyroxine for an undetermined time.  Recent labs at [facility] 08/23/2020: Glucose of 104, BUN 17, creatinine 1.05, GFR 56, sodium 137, potassium 4.6, chloride 103, CO2 22, calcium 9.4.  CBC was unremarkable, hemoglobin A1c was 5.9%. At her recent visit with Dr Posey Pronto in December he ordered multiple labs which apparently the patient has not followed up on getting.    It appears there are some compliance issues with both following through with testing and medication therapy. We are going to re-start her Levothyroxine at 75 mcg (her previous dosage) Will get a thyroid panel.    Medication Adjustments/Labs and Tests  Ordered: Current medicines are reviewed at length with the patient today.  Concerns regarding medicines are outlined above.   Disposition: Follow-up with Dr. Harl Bowie or APP   Signed, Levell July, NP 06/15/2021 9:48 PM    [facility], [place], [place] [address] Phone: [phone]; Fax: [phone]

## 2021-06-16 ENCOUNTER — Encounter: Payer: Self-pay | Admitting: Family Medicine

## 2021-06-16 ENCOUNTER — Ambulatory Visit (INDEPENDENT_AMBULATORY_CARE_PROVIDER_SITE_OTHER): Payer: Medicare Other | Admitting: Family Medicine

## 2021-06-16 VITALS — BP 128/80 | HR 89 | Ht 62.5 in | Wt 206.0 lb

## 2021-06-16 DIAGNOSIS — I6523 Occlusion and stenosis of bilateral carotid arteries: Secondary | ICD-10-CM | POA: Diagnosis not present

## 2021-06-16 DIAGNOSIS — R0600 Dyspnea, unspecified: Secondary | ICD-10-CM

## 2021-06-16 DIAGNOSIS — I4891 Unspecified atrial fibrillation: Secondary | ICD-10-CM | POA: Diagnosis not present

## 2021-06-16 DIAGNOSIS — R5383 Other fatigue: Secondary | ICD-10-CM | POA: Diagnosis not present

## 2021-06-16 DIAGNOSIS — I2510 Atherosclerotic heart disease of native coronary artery without angina pectoris: Secondary | ICD-10-CM

## 2021-06-16 DIAGNOSIS — E782 Mixed hyperlipidemia: Secondary | ICD-10-CM

## 2021-06-16 DIAGNOSIS — R0609 Other forms of dyspnea: Secondary | ICD-10-CM

## 2021-06-16 DIAGNOSIS — I10 Essential (primary) hypertension: Secondary | ICD-10-CM

## 2021-06-16 MED ORDER — NITROGLYCERIN 0.4 MG SL SUBL: SUBLINGUAL_TABLET | SUBLINGUAL | 3 refills | Status: AC

## 2021-06-16 MED ORDER — APIXABAN 5 MG PO TABS: 5.0000 mg | ORAL_TABLET | Freq: Two times a day (BID) | ORAL | 6 refills | Status: DC

## 2021-06-16 MED ORDER — ISOSORBIDE MONONITRATE ER 60 MG PO TB24: 60.0000 mg | ORAL_TABLET | Freq: Every day | ORAL | 3 refills | Status: DC

## 2021-06-16 MED ORDER — LEVOTHYROXINE SODIUM 75 MCG PO TABS: 75.0000 ug | ORAL_TABLET | Freq: Every day | ORAL | 3 refills | Status: AC

## 2021-06-16 NOTE — Patient Instructions (Signed)
Medication Instructions:  Your physician has recommended you make the following change in your medication:  START Eliquis 5 mg tablets twice daily  *If you need a refill on your cardiac medications before your next appointment, please call your pharmacy*   Lab Work: Thyroid Panel If you have labs (blood work) drawn today and your tests are completely normal, you will receive your results only by: MyChart Message (if you have MyChart) OR A paper copy in the mail If you have any lab test that is abnormal or we need to change your treatment, we will call you to review the results.   Testing/Procedures: Your physician has requested that you have an echocardiogram. Echocardiography is a painless test that uses sound waves to create images of your heart. It provides your doctor with information about the size and shape of your heart and how well your heart's chambers and valves are working. This procedure takes approximately one hour. There are no restrictions for this procedure.    Follow-Up: At [facility], you and your health needs are our priority.  As part of our continuing mission to provide you with exceptional heart care, we have created designated Provider Care Teams.  These Care Teams include your primary Cardiologist (physician) and Advanced Practice Providers (APPs -  Physician Assistants and Nurse Practitioners) who all work together to provide you with the care you need, when you need it.  We recommend signing up for the patient portal called "MyChart".  Sign up information is provided on this After Visit Summary.  MyChart is used to connect with patients for Virtual Visits (Telemedicine).  Patients are able to view lab/test results, encounter notes, upcoming appointments, etc.  Non-urgent messages can be sent to your provider as well.   To learn more about what you can do with MyChart, go to [email or web address].    Your next appointment:   3 week(s)  The format for your  next appointment:   In Person  Provider:   Nena Polio, NP   Other Instructions

## 2021-06-17 ENCOUNTER — Other Ambulatory Visit: Payer: Self-pay | Admitting: Family Medicine

## 2021-06-17 DIAGNOSIS — I10 Essential (primary) hypertension: Secondary | ICD-10-CM

## 2021-06-18 ENCOUNTER — Telehealth: Payer: Self-pay | Admitting: Family Medicine

## 2021-06-18 NOTE — Telephone Encounter (Signed)
Patient is supposed to start Eliquis today. Her pharmacy called asking what about the other blood thinners she is on.

## 2021-06-18 NOTE — Telephone Encounter (Signed)
Contacted patient and Robbie Lis and advised that patient is no longer taking effient

## 2021-07-06 NOTE — Progress Notes (Deleted)
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 the patient to call us if there were any changes or she began to have increasing episodes with increasing intensity and duration or associated symptoms.  In this case we would set her up for cardiac catheterization.  She verbalized understanding.  On today's visit she states she had some episodes  of chest pain without associated symptoms or radiation. She states she has been without both her Imdur and NTG. We will refill both medications. We are discontinuing ASA due to starting systemic anticoagulation for new onset atrial fibrillation. She states she is no longer on Effient. She cannot recollect who may have discontinued the medication.  At visit with [name] in December she was still on the medication per his note along with asa.   2. New onset atrial fibrillation Patient  recently complaining of palpitations, increased fatigue,and activity intolerance. EKG today shows atrial fibrillation with controlled rate of 74. She is currently on Toprol XL 50 mg po daily. Will start Eliquis 5 mg po bid.  We discussed possible DC cardioversion in the future. She verbalizes understanding.   3. Bilateral carotid artery stenosis Carotid artery duplex study on 08/28/2020: RICA with 40 to 45% stenosis.  LICA 60 to 16%.Annia Belt demonstrated antegrade flow.  Normal flow hemodynamics in bilateral subclavian arteries.  4. Essential hypertension Blood pressure today 128/80.  Continue amlodipine 10 mg daily.  Continue benazepril 10 mg daily.  Continue Lasix 80 mg daily.  Continue Toprol-XL 50 mg daily.  5. Mixed hyperlipidemia Not tolerant of statins.  Had been on Zetia in the past.   Pending results of lipids will consider Repatha. Lipid panel 08/23/2020: Total cholesterol 197, triglycerides 198, HDL 49, LDL 114.  Please refer to lipid clinic for possible initiation of PCSK9 inhibitor.  6.  DOE/shortness of breath Complained of increased dyspnea on exertion last visit. She continues with complaints of DOE/ SOB with exertional fatigue and significant activity intolerance. Please get a follow up echocardiogram.    7.  Fatigue Continues with complaints of significant fatigue.  She states she has been without her Levothyroxine for an undetermined time. Recent labs at [facility] 08/23/2020: Glucose of 104, BUN 17, creatinine 1.05, GFR 56, sodium 137, potassium 4.6, chloride 103, CO2 22, calcium 9.4.  CBC was unremarkable, hemoglobin A1c was 5.9%. At her recent visit with [name] in December he ordered multiple labs which apparently the patient has not followed up on getting.    It appears there are some compliance issues with both following through with testing and medication therapy. We are going to re-start her Levothyroxine at 75 mcg (her previous dosage) Will get a thyroid panel.     Medication Adjustments/Labs and Tests Ordered: Current medicines are reviewed at length with the patient today.  Concerns regarding medicines are outlined above.   Disposition: Follow-up with Toni. Harl Bowie or APP   Signed, Levell July, NP 07/06/2021 7:56 PM    [facility] [place], [place], [place] [address] Phone: [phone]; Fax: [phone]

## 2021-07-07 ENCOUNTER — Ambulatory Visit: Payer: Medicaid Other | Admitting: Family Medicine

## 2021-07-07 DIAGNOSIS — I6523 Occlusion and stenosis of bilateral carotid arteries: Secondary | ICD-10-CM

## 2021-07-07 DIAGNOSIS — I10 Essential (primary) hypertension: Secondary | ICD-10-CM

## 2021-07-07 DIAGNOSIS — R0609 Other forms of dyspnea: Secondary | ICD-10-CM

## 2021-07-07 DIAGNOSIS — I4891 Unspecified atrial fibrillation: Secondary | ICD-10-CM

## 2021-07-07 DIAGNOSIS — I2510 Atherosclerotic heart disease of native coronary artery without angina pectoris: Secondary | ICD-10-CM

## 2021-07-22 ENCOUNTER — Other Ambulatory Visit: Payer: Self-pay

## 2021-07-22 ENCOUNTER — Ambulatory Visit (HOSPITAL_COMMUNITY)
Admission: RE | Admit: 2021-07-22 | Discharge: 2021-07-22 | Disposition: A | Discharge status: Home / Self Care | Payer: Medicare Other | Source: Ambulatory Visit | Attending: Family Medicine | Admitting: Family Medicine

## 2021-07-22 DIAGNOSIS — R0609 Other forms of dyspnea: Secondary | ICD-10-CM | POA: Insufficient documentation

## 2021-07-22 LAB — ECHOCARDIOGRAM COMPLETE
Area-P 1/2: 4.68 cm2
S' Lateral: 3.5 cm

## 2021-07-22 NOTE — Progress Notes (Signed)
  Echocardiogram 2D Echocardiogram has been performed.  [name] 07/22/2021, 2:45 PM

## 2021-07-24 ENCOUNTER — Telehealth: Payer: Self-pay | Admitting: *Deleted

## 2021-07-24 ENCOUNTER — Ambulatory Visit: Payer: Medicare Other | Admitting: Cardiology

## 2021-07-24 ENCOUNTER — Encounter: Payer: Self-pay | Admitting: Cardiology

## 2021-07-24 VITALS — BP 118/72 | HR 98 | Ht 63.0 in | Wt 195.0 lb

## 2021-07-24 DIAGNOSIS — M25569 Pain in unspecified knee: Secondary | ICD-10-CM

## 2021-07-24 DIAGNOSIS — I2510 Atherosclerotic heart disease of native coronary artery without angina pectoris: Secondary | ICD-10-CM

## 2021-07-24 DIAGNOSIS — Z23 Encounter for immunization: Secondary | ICD-10-CM

## 2021-07-24 DIAGNOSIS — I4891 Unspecified atrial fibrillation: Secondary | ICD-10-CM | POA: Diagnosis not present

## 2021-07-24 MED ORDER — SACUBITRIL-VALSARTAN 24-26 MG PO TABS: 1.0000 | ORAL_TABLET | Freq: Two times a day (BID) | ORAL | 0 refills | Status: DC

## 2021-07-24 MED ORDER — SACUBITRIL-VALSARTAN 24-26 MG PO TABS: 1.0000 | ORAL_TABLET | Freq: Two times a day (BID) | ORAL | 6 refills | Status: DC

## 2021-07-24 NOTE — Telephone Encounter (Signed)
Lesle Chris, LPN  [id number]  1:28 PM EDT Back to Top    Discussed at OV today with Dr. Wyline Mood.  Copy to pcp.

## 2021-07-24 NOTE — Patient Instructions (Addendum)
Medication Instructions:  Stop Benazepril (Lotensin). Begin Entresto 24/26mg  twice a day - AFTER being off of the Benazepril for 2 days.   Stop Aspirin.  Continue all other medications.     Labwork: none  Testing/Procedures: Your physician has recommended that you have a Cardioversion (DCCV). Electrical Cardioversion uses a jolt of electricity to your heart either through paddles or wired patches attached to your chest. This is a controlled, usually prescheduled, procedure. Defibrillation is done under light anesthesia in the hospital, and you usually go home the day of the procedure. This is done to get your heart back into a normal rhythm. You are not awake for the procedure. Please see the instruction sheet given to you today.   Follow-Up: 6 weeks   Any Other Special Instructions Will Be Listed Below (If Applicable). You have been referred to:  orthopaedic - Dr. Romeo Apple   If you need a refill on your cardiac medications before your next appointment, please call your pharmacy.

## 2021-07-24 NOTE — Telephone Encounter (Signed)
-----   Message from Netta Neat., NP sent at 07/23/2021  4:23 PM EDT ----- Please call the patient and let her know the echocardiogram shows some reduced pumping function of the heart when compared to previous echocardiogram in 2019.  There are some wall motion abnormalities ( This usually means there is some decreased blood flow in the coronary arteries ). This was not present on previous echocardiogram.  She has significant CAD with no vessels amenable for intervention per Dr Katrinka Blazing at her last cardiac catheterization.  Please get an earlier appointment for her to talk about some medication changes that may help. Increase her Imdur to 90 mg po daily.   I will also send this note to Dr Wyline Mood for his input. May be able to initiate Entresto  but will need to stop Benazepril for 36 hours prior to starting Entresto. May need to stop CCB also. Will get his input first.   Netta Neat, NP  07/23/2021 4:04 PM

## 2021-07-24 NOTE — Progress Notes (Signed)
Clinical Summary [name] is a 65 y.o.femaleseen today for follow up of the following medical problems.      1. CAD   - CABG 2005 4 vessel    cath 05/2015 results as reported below, no targets for pci, recs for medical management. Was started on ranexa 529m bid, had headache on nitrates but recently on lower dose.   - headaches on ranexa.      06/2018 echo LVEF 55-60% 06/2018 nuclear stress without ischemia   -  Remains on effient. Looking back had been on long time even before I started seeing her in 2014, I believe her prior cardiologist maintained her on DAPT due to her extensive cardiac revasc history. Has tolerated several years    07/2021 echo LVEF 40-45%, normal RV  - no edema.DOE with activities, fatigue      2. Carotid stenosis   - Carotid [place] 40-59% bilateral [id number]/2725RICA 436-64% LICA 440-34%  08/2020 carotid [place] right 474-25% LICA 795-63%- no recent symptoms   3. HTN   - she is compliant with meds   4. Hyperlipidemia   - did not tolerate crestor, prava, or lovastatin due to side effects.   - remains on zetia   - labs followed by pcp, we do not have the most recent results    5. Afib - new diagnosis 06/16/2021 - on eliquis - ongoing palpitations, fatigue, dizziness   Past Medical History:  Diagnosis Date   Acute renal failure ([place]    Anemia    Anxiety    Arthritis    Asthma    Car[place]id artery disease ([place]    a. Duplex 50-69% BICA 03/2014.   Chronic back pain    Collagen vascular disease (HCC)    COPD (chronic obstructive pulmonary disease) (HCC)    Coronary artery disease    a. s/p prior CABG 2005. b. BMS 02/2009 to VG-PDA. c. NSTEMI 08/2010 s/p PTCA/DES to SVG-PDA in previously stented area. d. DES to SVG to RCA on 05/24/14  c. LHC 06/04/2015 w/ no sig change in her anatomy and Rx managment   DDD (degenerative disc disease)    Depression    Diverticulitis    4 documented episodes by CT; most recent April 2012   DIVERTICULITIS OF  COLON 07/14/2010   Qualifier: Diagnosis of  By: LWestly Pam    GERD (gastroesophageal reflux disease)    History of blood transfusion    HTN (hypertension)    Hyperlipidemia    Hypertension    Hypothyroidism    Ischemic cardiomyopathy    a. EF 40% in 2012 by cath. b. improved to 50-55% by cath 05/2014  [facility]   Kidney stones    Seizures ([place]    Sinus bradycardia    Sleep apnea    a. Mild-to-moderate obstructive sleep apnea diagnosed in April 2012 by sleep study.   Tension headache      Allergies  Allergen Reactions   Pneumococcal Vaccines Anxiety, Palpitations and Cough   Adhesive [Tape] Itching and Other (See Comments)    Blisters (pls use paper tape)   Crestor [Rosuvastatin] Other (See Comments)    Muscle pain   Lipitor [Atorvastatin] Other (See Comments)    Cramps   Morphine And Related Itching    Pt states she takes with benadryl     Current Outpatient Medications  Medication Sig Dispense Refill   albuterol (PROVENTIL) (2.5 MG/3ML) 0.083% nebulizer solution Take 2.5  mg by nebulization every 6 (six) hours as needed for wheezing or shortness of breath.     albuterol (VENTOLIN HFA) 108 (90 Base) MCG/ACT inhaler Inhale 2 puffs into the lungs every 6 (six) hours as needed for wheezing or shortness of breath.     amLODipine (NORVASC) 10 MG tablet TAKE ONE TABLET BY MOUTH ONCE DAILY. 90 tablet 3   apixaban (ELIQUIS) 5 MG TABS tablet Take 1 tablet (5 mg total) by mouth 2 (two) times daily. 60 tablet 6   aspirin 325 MG tablet Take 162 mg by mouth daily.     benazepril (LOTENSIN) 10 MG tablet TAKE (1) TABLET BY MOUTH EACH MORNING. 90 tablet 3   cetirizine (ZYRTEC ALLERGY) 10 MG tablet Take 1 tablet (10 mg total) by mouth daily. 30 tablet 0   ezetimibe (ZETIA) 10 MG tablet TAKE (1) TABLET BY MOUTH ONCE DAILY. 90 tablet 3   FLUoxetine (PROZAC) 40 MG capsule Take 1 capsule (40 mg total) by mouth daily. 90 capsule 1   furosemide (LASIX) 80 MG tablet  TAKE ONE TABLET DAILY. 90 tablet 3   isosorbide mononitrate (IMDUR) 60 MG 24 hr tablet Take 1 tablet (60 mg total) by mouth daily. 90 tablet 3   levETIRAcetam (KEPPRA) 250 MG tablet TAKE (1) TABLET BY MOUTH TWICE DAILY. 180 tablet 0   levothyroxine (SYNTHROID) 75 MCG tablet Take 1 tablet (75 mcg total) by mouth daily before breakfast. 90 tablet 3   metoprolol succinate (TOPROL-XL) 50 MG 24 hr tablet Take 1 tablet (50 mg total) by mouth daily. 90 tablet 1   Multiple Vitamin (MULTIVITAMIN WITH MINERALS) TABS tablet Take 1 tablet by mouth 2 (two) times daily. 25 tablet 4   nitroGLYCERIN (NITROSTAT) 0.4 MG SL tablet DISSOLVE 1 TABLET UNDER TONGUE EVERY 5 MINUTES UP TO 15 MIN FOR CHESTPAIN. IF NO RELIEF CALL 911. 25 tablet 3   Omega 3 1000 MG CAPS Take 1 capsule (1,000 mg total) by mouth 2 (two) times daily.     omeprazole (PRILOSEC) 20 MG capsule TAKE (1) CAPSULE BY MOUTH ONCE DAILY. 90 capsule 1   potassium chloride SA (KLOR-CON) 20 MEQ tablet TAKE (1) TABLET BY MOUTH EACH MORNING. 90 tablet 3   rosuvastatin (CRESTOR) 5 MG tablet Take 1 tablet (5 mg total) by mouth daily. 90 tablet 1   zolpidem (AMBIEN) 5 MG tablet TAKE ONE TABLET BY MOUTH AT BEDTIME AS NEEDED. 30 tablet 0   No current facility-administered medications for this visit.     Past Surgical History:  Procedure Laterality Date   APPENDECTOMY  ~ [place] CATHETERIZATION  Jan 2012   patent stents in RCA graft; CAD stable   CARDIAC CATHETERIZATION N/A 06/03/2015   Procedure: Left Heart Cath and Cors/Grafts Angiography;  Surgeon: Belva Crome, MD;  Location: [facility];  Service: Cardiovascular;  Laterality: N/A;   CARPAL TUNNEL WITH CUBITAL TUNNEL Right 2001   CORONARY ANGIOPLASTY WITH STENT PLACEMENT  2011   Promus Premier DES, 3.5 mm x 12 mm, for 99% proximal stent ISR in the SVG-RPDA    CORONARY ANGIOPLASTY WITH STENT PLACEMENT  05/24/2014   CORONARY ARTERY BYPASS GRAFT  2005   LIMA to AL, SVG to LAD, SVG to Cx,  SVG-PDA   KNEE ARTHROSCOPY W/ ACL RECONSTRUCTION Left 2001   LAPAROSCOPIC CHOLECYSTECTOMY  2010   LEFT HEART CATHETERIZATION WITH CORONARY/GRAFT ANGIOGRAM N/A 05/24/2014   Procedure: LEFT HEART CATHETERIZATION WITH Beatrix Fetters;  Surgeon: Leonie Man, MD; Leory Plowman  50-55%, LAD & D1 100%, LIMA-D1 OK, CFX OK, OM1 & OM2 100%, lat OM Breanne Olvera 100%, RCA 100%; pSVG-RPDA 99% rx w/ scoring balloon PTCA & 3.5 x 12 mm Promus DES; SVG-LAD, SVG-OM known occluded       TUBAL LIGATION  ~ [place]  ~ 1993     Allergies  Allergen Reactions   Pneumococcal Vaccines Anxiety, Palpitations and Cough   Adhesive [Tape] Itching and Other (See Comments)    Blisters (pls use paper tape)   Crestor [Rosuvastatin] Other (See Comments)    Muscle pain   Lipitor [Atorvastatin] Other (See Comments)    Cramps   Morphine And Related Itching    Pt states she takes with benadryl      Family History  Problem Relation Age of Onset   Heart attack Mother    Heart disease Mother    Breast cancer Mother    Heart attack Father    Heart disease Father    Colon cancer Neg Hx      Social History Ms. Gensel reports that she quit smoking about 5 years ago. Her smoking use included cigarettes. She started smoking about 28 years ago. She has a 10.50 pack-year smoking history. She has never used smokeless tobacco. Ms. Littrell reports no history of alcohol use.   Review of Systems CONSTITUTIONAL: No weight loss, fever, chills, weakness or fatigue.  HEENT: Eyes: No visual loss, blurred vision, double vision or yellow sclerae.No hearing loss, sneezing, congestion, runny nose or sore throat.  SKIN: No rash or itching.  CARDIOVASCULAR: per hpi RESPIRATORY: per hpi  GASTROINTESTINAL: No anorexia, nausea, vomiting or diarrhea. No abdominal pain or blood.  GENITOURINARY: No burning on urination, no polyuria NEUROLOGICAL: No headache, dizziness, syncope, paralysis, ataxia, numbness or tingling in the  extremities. No change in bowel or bladder control.  MUSCULOSKELETAL: No muscle, back pain, joint pain or stiffness.  LYMPHATICS: No enlarged nodes. No history of splenectomy.  PSYCHIATRIC: No history of depression or anxiety.  ENDOCRINOLOGIC: No reports of sweating, cold or heat intolerance. No polyuria or polydipsia.  Marland Kitchen   Physical Examination Today's Vitals   07/24/21 1148  BP: 118/72  Pulse: 98  SpO2: 98%  Weight: 195 lb (88.5 kg)  Height: '5\' 3"'  (1.6 m)   Body mass index is 34.54 kg/m.  Gen: resting comfortably, no acute distress HEENT: no scleral icterus, pupils equal round and reactive, no palptable cervical adenopathy,  CV: irreg, no m/r/g no jvd Resp: Clear to auscultation bilaterally GI: abdomen is soft, non-tender, non-distended, normal bowel sounds, no hepatosplenomegaly MSK: extremities are warm, no edema.  Skin: warm, no rash Neuro:  no focal deficits Psych: appropriate affect   Diagnostic Studies Cath 2012   Left main coronary was normal.   Left anterior descending artery was occluded just after the septal   perforator. First diagonal Javeah Loeza was subtotally occluded. There was   some filling of the distal LAD and diagonals from collateral branches   from the LIMA. Circumflex coronary artery was occluded in the   midvessel. The AV groove Cyndia Degraff was patent. There was a small first   obtuse marginal Edgar Corrigan with 30-40% multiple lesions. There was a small   second obtuse marginal Kaula Klenke with 30-40% discrete lesions. There is a   very large obtuse marginal Dastan Krider seen filling from collaterals from the   left internal mammary artery.   Right coronary artery was 100% occluded proximally with both left-to-   right and right-to-right collaterals. The  distal PDA also filled from   the vein graft to the right coronary artery.   The left internal mammary artery was widely patent. It also filled the   distal right large obtuse marginal Yarely Bebee and filled retrograde to a    smaller diagonal Kahealani Yankovich.   There was known occlusion of the 2 left-sided vein grafts. The   saphenous vein graft to the right coronary artery was widely patent.   Bare-metal stent in the mid-to-distal vessel widely patent. There was   poor runoff to the vessel primarily being retrograde into the posterior   lateral Manahil Vanzile, however, there was no new focal stenosis.   RAO ventriculography. RAO ventriculography showed anterior apical and   mid inferior wall hypokinesis. EF was in the 40% range.   Right heart catheterization showed mean pulmonary capillary wedge   pressure of 24, PA pressure was 55/26, RV pressure was 52/5, RA pressure   mean was 16, aortic pressure was 155/76, LV pressure was equal to 167/16   with a post A-wave EDP of 26.   Cardiac output was 6.3 liters per minute with a cardiac index of 3   liters per minute per meter squared by Fick.    IMPRESSION: The patient's coronary artery disease is stable. The   stents in the RCA graft were patent. There are no new lesions that I   can see. She does have collateralized distal right and OM Pammie Chirino.   Continued medical therapy is warranted. If she continues to have chest   pain, Ranexa may be in order since it is somewhat late in the day and   she had both venous and arterial sheath. She will be kept overnight and   discharged in the morning. She tolerated the procedure well.     03/2014 Echo   Study Conclusions  - Left ventricle: The cavity size was normal. Wall thickness was increased in a pattern of mild LVH. Systolic function was normal. The estimated ejection fraction was in the range of 50% to 55%. There is akinesis of the basalinferolateral myocardium. Features are consistent with a pseudonormal left ventricular filling pattern, with concomitant abnormal relaxation and increased filling pressure (grade 2 diastolic dysfunction). - Aortic valve: Mildly calcified annulus. Trileaflet. There was no significant  regurgitation. - Mitral valve: There was trivial regurgitation. - Left atrium: The atrium was mildly to moderately dilated. - Right ventricle: Systolic function was mildly reduced. - Right atrium: Central venous pressure (est): 3 mm Hg. - Atrial septum: No defect or patent foramen ovale was identified. - Tricuspid valve: There was trivial regurgitation. - Pulmonary arteries: Systolic pressure could not be accurately estimated. - Pericardium, extracardiac: There was no pericardial effusion.  Impressions:  - Mild LVH with LVEF 50-55%, basal inferolateral hypokinesis, grade 2 diastolic dysfunction. Mild to moderate left atrial enlargement. Mildly reduced RV contraction. Unable to assess PASP.  03/2014 Carotid US   IMPRESSION:   Bilateral 50-69% stenosis. The left internal carotid peak systolic   velocity has increased slightly in the interval from the prior exam.   05/2014 Cath FINDINGS:   Hemodynamics:   Central Aortic Pressure / Mean: 110/62/81 mmHg Left Ventricular Pressure / LVEDP: 110/14/21 mmHg   Left Ventriculography: EF: 50 at 55 % Wall Motion: Severe hypokinesis of the Basal to MID inferior-inferolateraL wall with mild hypokinesis of the basal anterior wall   Coronary Anatomy: Dominance: Right Left Main: Normal caliber vessel that itself is free of significant disease. Bifurcates into the LAD and Circumflex LAD: Moderate  caliber vessel that is 100% occluded after a septal perforator and small diagonal Montrelle Eddings. There is a first diagonal Sharhonda Atwood that is also half percent occluded after initial subtotal occlusion.   Left Circumflex: Moderate caliber vessel it gives rise to a proximal small caliber OM Salam Micucci that is somewhat tortuous and free of significant disease. The vessel then bifurcates into the introducer complex which is small in caliber and perfuses the basal inferolateral wall. There is a major lateral OM Roxy Mastandrea the visualized the proximal segment is occluded. There is  late retrograde filling of the remainder the nature OM which appears to have 2 branches distally. This is faint collaterals from the proximal OM.    RCA: 100% proximal occluded. SVG-RPDA: Large-caliber graft with mildly irregular is proximally, there are overlapping stents in the distal mid graft with 99% napkin ring proximal in-stent restenosis of the most proximal stent that extends just minimally to the unstented segment. The remainder of the graft is relatively free of disease and attached to the mid RPDA. RPDA: Large-caliber tortuous vessel that reaches almost to the apex. RPL Sysytem:The RPAV fills via retrograde flow in the RPDA. There are several small posterolateral branches that are relatively free of disease. The PAV is relatively free of disease.   After reviewing the initial angiography, the culprit lesion was thought to be the 99% proximal stent ISR in the SVG-RPDA.  Preparation were made to proceed with PCI on this lesion.  As the lesion is focal & appears to be fibrous in nature and too stenosed to safely pass a filter wire, the decision was made to pre-dilate & stent without distal protection.  No evidence of a filling defect was noted besides the "napkin-ring" lesion.   Percutaneous Coronary Intervention:  Sheath exchanged for 6 Fr Guide: 6 Fr   AL1        Guidewire: BMW (after failed attempt with Pro-Water Predilation Balloon: Angioscult Cutting Balloon 3.5 mm x 10 mm;   12 Atm x 45 Sec,-- excellent predilation of the fibrous lesion at high pressure. Brisk TIMI-3 flow down stream but no sign of no reflow. The decision was made to proceed with stenting. Stent: Promus Premier DES 3.5 mm x 12 mm; overlaps roughly 4 mm into the original stent. Deployed at: 20 Atm x 30 Sec Post-dilation Balloon: Greenlee Emerge 4.0 mm x 8 mm;   14 Atm x 30 Sec x 2 -   Final Diameter: 4.1 mm   Post deployment angiography in multiple views, with and without guidewire in place revealed excellent stent  deployment and lesion coverage.  There was no evidence of dissection or perforation. Brisk TIMI-3 flow is noted in the downstream vessel   MEDICATIONS: Anesthesia:  Local Lidocaine 2 ml Sedation:  4 mg IV Versed, 100 mcg IV fentanyl ;   Omnipaque Contrast: 180 ml Anticoagulation:  IV Heparin 13500 Units total   Anti-Platelet Agent:  Effient and aspirin as standing home medication   PATIENT DISPOSITION:   The patient was transferred to the PACU holding area in a hemodynamicaly stable, chest pain free condition. The patient tolerated the procedure well, and there were no complications.  EBL:   < 5 ml The patient was stable before, during, and after the procedure.   POST-OPERATIVE DIAGNOSIS:   Severe native CAD with 100% negative RCA, LAD & D1 along with OM 1 and OM 2 as previously described Severe proximal edge in-stent restenosis of SVG-RPDA   Successful PCI of the proximal edge in-stent restenosis of SVG  RCA with scoring balloon angioplasty followed by DES stent placement: Promus Premier 3.5 mm x 12 mm postdilated to 4.1 mm Widely patent LIMA-diagonal with distal collaterals to what appears to be the apical LAD. Known occlusion of the SVG-LAD and SVG-OM Relatively preserved LVEF as noted on the cardiogram recently. There is known hypokinesis to akinesis of the nasal to mid inferior inferolateral wall as well as mild hypokinesis of the basal anterior wall assistant with known CAD.   PLAN OF CARE: Overnight observation post-PCI. Continue home doses of aspirin plus Effient. Standard post radial cath care with care being removal   Discharge in the morning if stable. Followup with Dr. Harl Bowie - continue to optimize medical management of existing severe CAD.      05/2015 Cath                                                             Dominance: Co-dominant                             Left Anterior Descending                Mid LAD lesion, 100% stenosed.                First Diagonal  Mckenzy Salazar           The vessel is small in size.                1st Diag lesion, 99% stenosed.                          Ramus Intermedius      The vessel is small .                               Left Circumflex      The vessel is small .                Mid Cx to Dist Cx lesion, 100% stenosed.                Second Obtuse Marginal Johnel Yielding           The vessel is small in size.                Third Obtuse Marginal Zavion Sleight           3rd Mrg filled by collaterals from 1st Mrg.                               Right Coronary Artery                Prox RCA to Mid RCA lesion, 100% stenosed.                Dist RCA lesion, 100% stenosed.                               Graft Angiography  Free Graft to Dist LAD      SVG                Prox Graft to Mid Graft lesion, 100% stenosed.                         Free Graft to Dist RCA      SVG                Mid Graft lesion, 30% stenosed. The lesion was previously treated with a stent (unknown type) .                         Free Graft to 2nd Mrg      SVG                Origin to Prox Graft lesion, 100% stenosed.                  Free LIMA Graft to 2nd Diag      LIMA                      Cath RECOMMENDATIONS:    Continued aggressive risk factor modification No anatomy amenable to percutaneous intervention.          09/2017 carotid US    Final Interpretation: Right Carotid: There is evidence in the right ICA of a 40-59% stenosis. Stable                40-59% RICA stenosis.  Left Carotid: There is evidence in the left ICA of a 40-59% stenosis. Stable               40-98% LICA stenosis.  Vertebrals:  Both vertebral arteries were patent with antegrade flow. Subclavians: Normal flow hemodynamics were seen in bilateral subclavian              arteries.     06/2018 nuclear stress   Moderate inferior (base, mid, distal), inferolateral (base, mid) defect consistent with scar and possible soft tissue attenuation No  significant ischemia LVEF 33% High risk scan due to scar and depressed LVEF   06/2018 echo Study Conclusions   - Left ventricle: The cavity size was mildly dilated. Wall   thickness was normal. Systolic function was normal. The estimated   ejection fraction was in the range of 55% to 60%. - Mitral valve: There was mild regurgitation. - Left atrium: The atrium was mildly dilated. - Pulmonary arteries: PA peak pressure: 32 mm Hg (S).   08/2020 nuclear stress [facility] 1. Exam positive for moderate size area of mild reversibility  involving the anterolateral wall. Fixed defect involving the  inferolateral wall and inferior wall compatible with scar.   2. Diffusely abnormal left ventricular wall motion with inferior  septal and inferior wall hypokinesis and inferolateral wall  hypokinesis and apical akinesis..   3. Left ventricular ejection fraction 31%   4. Non invasive risk stratification*: High    08/2020 echo Summary    1. The left ventricle is normal in size with upper normal wall thickness.    2. The left ventricular systolic function is overall normal, LVEF is  visually estimated at 55%.    3. There is grade II diastolic dysfunction (elevated filling pressure).    4. The left atrium is moderately dilated in size.    5. The right ventricle is normal in size, with normal systolic  function.    6. The right atrium is mildly dilated  in size.    07/2021 echo IMPRESSIONS     1. Left ventricular ejection fraction, by estimation, is 40 to 45%. The  left ventricle has mildly decreased function. The left ventricle  demonstrates regional wall motion abnormalities (see scoring  diagram/findings for description). There is mild left  ventricular hypertrophy. Left ventricular diastolic parameters are  indeterminate.   2. Right ventricular systolic function is normal. The right ventricular  size is normal. There is normal pulmonary artery systolic pressure. The  estimated right  ventricular systolic pressure is 41.2 mmHg.   3. The mitral valve is grossly normal. Trivial mitral valve  regurgitation.   4. The aortic valve is tricuspid. Aortic valve regurgitation is not  visualized. Mild aortic valve sclerosis is present, with no evidence of  aortic valve stenosis.   5. The inferior vena cava is normal in size with greater than 50%  respiratory variability, suggesting right atrial pressure of 3 mmHg.   Assessment and Plan  1. CAD /ICM - antianginal therapy limited. She is on max dose norvasc. Lower heart rates, would not increase beta blocker. Imdur may be contributing to headaches, lower dose to 35m daily. Prior headaches on ranexa -  06/2018 stress test without significant ischemia   -no recent symptoms - d/c benazepril, wait 48 hrs start entresto 24/259mbid - stop aspirin since on eliquis, should be off effient as well   2. Carotid stenosis   - moderate by last [place]continue to monitor   3. HTN   - continue current meds   4. Afib - relatively new diagnosis, remaisn symptomatic despite rate control - plan for outpatient cardioversion     JoArnoldo LenisM.D.

## 2021-07-25 ENCOUNTER — Encounter: Payer: Self-pay | Admitting: Orthopedic Surgery

## 2021-07-28 ENCOUNTER — Encounter: Payer: Self-pay | Admitting: *Deleted

## 2021-07-29 ENCOUNTER — Other Ambulatory Visit: Payer: Self-pay | Admitting: Cardiology

## 2021-07-29 ENCOUNTER — Encounter (HOSPITAL_COMMUNITY)
Admission: RE | Admit: 2021-07-29 | Discharge: 2021-07-29 | Disposition: A | Discharge status: Home / Self Care | Payer: Medicare Other | Source: Ambulatory Visit | Attending: Cardiology | Admitting: Cardiology

## 2021-07-29 ENCOUNTER — Telehealth: Payer: Self-pay | Admitting: Cardiology

## 2021-07-29 DIAGNOSIS — I10 Essential (primary) hypertension: Secondary | ICD-10-CM

## 2021-07-29 NOTE — Telephone Encounter (Signed)
Reports worsening SOB, dizziness, elevated HR, nausea, shaking, chest pain. Sounded SOB on phone Advised to go to the ED now.  Verbalized understanding

## 2021-07-29 NOTE — Telephone Encounter (Signed)
Pt c/o medication issue:  1. Name of Medication: sacubitril-valsartan (ENTRESTO) 24-26 MG  2. How are you currently taking this medication (dosage and times per day)? Take 1 tablet by mouth 2 (two) times daily.  3. Are you having a reaction (difficulty breathing--STAT)? Yes   4. What is your medication issue? Heart is in afib.. pt was put on Eliquis and Entresto.. pt hasnt been able to leave her house since she began taking them.. cant eat, fatigue, chest feels like heart is going to beat out of it, breathing is really bad, pt is sick, dizzy, nauseaus... please advise

## 2021-07-29 NOTE — Telephone Encounter (Signed)
Agree with ER evaluation   J Prairie Stenberg MD 

## 2021-07-29 NOTE — Telephone Encounter (Signed)
Christine from Short Stay calling to request orders for whatever the patient is needing done prior to her cardioversion. She says they also need what her consent should say. She states to just put it in the computer and does not request a call back.

## 2021-07-29 NOTE — Telephone Encounter (Signed)
Orders done

## 2021-07-29 NOTE — Telephone Encounter (Signed)
Cardioversion scheduled for Thursday, 07/31/2021 at 9:30 am - [facility].     Checking percert

## 2021-07-29 NOTE — Patient Instructions (Signed)
SAMAMTHA TIEGS  07/29/2021     @[email or web address] @   Your procedure is scheduled on  07/31/2021.   Report to 08/02/2021 at  0800 A.M.   Call this number if you have problems the morning of surgery:  [phone]   Remember:  Do not eat or drink after midnight.     DO NOT miss any doses of your eliquis before your procedure.    Use your nebulizer and your inhaler before you come and bring your rescue inhaler with you.       Take these medicines the morning of surgery with A SIP OF WATER       amlodipine, prozac, isosobide, keppra, levothyroxine, metoprolol, prilosec, entresto.     Do not wear jewelry, make-up or nail polish.  Do not wear lotions, powders, or perfumes, or deodorant.  Do not shave 48 hours prior to surgery.  Men may shave face and neck.  Do not bring valuables to the hospital.  [facility] is not responsible for any belongings or valuables.  Contacts, dentures or bridgework may not be worn into surgery.  Leave your suitcase in the car.  After surgery it may be brought to your room.  For patients admitted to the hospital, discharge time will be determined by your treatment team.  Patients discharged the day of surgery will not be allowed to drive home and must have someone with them for 24 hours.    Special instructions:   DO NOT smoke tobacco or vape for 24 hours before your procedure.  Please read over the following fact sheets that you were given. Coughing and Deep Breathing, Anesthesia Post-op Instructions, and Care and Recovery After Surgery      Electrical Cardioversion Electrical cardioversion is the delivery of a jolt of electricity to restore a normal rhythm to the heart. A rhythm that is too fast or is not regular keeps the heart from pumping well. In this procedure, sticky patches or metal paddles are placed on the chest to deliver electricity to the heart from a device. This procedure may be done in an emergency if: There is  low or no blood pressure as a result of the heart rhythm. Normal rhythm must be restored as fast as possible to protect the brain and heart from further damage. It may save a life. This may also be a scheduled procedure for irregular or fast heart rhythms that are not immediately life-threatening. Tell a health care provider about: Any allergies you have. All medicines you are taking, including vitamins, herbs, eye drops, creams, and over-the-counter medicines. Any problems you or family members have had with anesthetic medicines. Any blood disorders you have. Any surgeries you have had. Any medical conditions you have. Whether you are pregnant or may be pregnant. What are the risks? Generally, this is a safe procedure. However, problems may occur, including: Allergic reactions to medicines. A blood clot that breaks free and travels to other parts of your body. The possible return of an abnormal heart rhythm within hours or days after the procedure. Your heart stopping (cardiac arrest). This is rare. What happens before the procedure? Medicines Your health care provider may have you start taking: Blood-thinning medicines (anticoagulants) so your blood does not clot as easily. Medicines to help stabilize your heart rate and rhythm. Ask your health care provider about: Changing or stopping your regular medicines. This is especially important if you are taking diabetes medicines or blood thinners. Taking medicines such  as aspirin and ibuprofen. These medicines can thin your blood. Do not take these medicines unless your health care provider tells you to take them. Taking over-the-counter medicines, vitamins, herbs, and supplements. General instructions Follow instructions from your health care provider about eating or drinking restrictions. Plan to have someone take you home from the hospital or clinic. If you will be going home right after the procedure, plan to have someone with you for  24 hours. Ask your health care provider what steps will be taken to help prevent infection. These may include washing your skin with a germ-killing soap. What happens during the procedure?  An IV will be inserted into one of your veins. Sticky patches (electrodes) or metal paddles may be placed on your chest. You will be given a medicine to help you relax (sedative). An electrical shock will be delivered. The procedure may vary among health care providers and hospitals. What can I expect after the procedure? Your blood pressure, heart rate, breathing rate, and blood oxygen level will be monitored until you leave the hospital or clinic. Your heart rhythm will be watched to make sure it does not change. You may have some redness on the skin where the shocks were given. Follow these instructions at home: Do not drive for 24 hours if you were given a sedative during your procedure. Take over-the-counter and prescription medicines only as told by your health care provider. Ask your health care provider how to check your pulse. Check it often. Rest for 48 hours after the procedure or as told by your health care provider. Avoid or limit your caffeine use as told by your health care provider. Keep all follow-up visits as told by your health care provider. This is important. Contact a health care provider if: You feel like your heart is beating too quickly or your pulse is not regular. You have a serious muscle cramp that does not go away. Get help right away if: You have discomfort in your chest. You are dizzy or you feel faint. You have trouble breathing or you are short of breath. Your speech is slurred. You have trouble moving an arm or leg on one side of your body. Your fingers or toes turn cold or blue. Summary Electrical cardioversion is the delivery of a jolt of electricity to restore a normal rhythm to the heart. This procedure may be done right away in an emergency or may be a  scheduled procedure if the condition is not an emergency. Generally, this is a safe procedure. After the procedure, check your pulse often as told by your health care provider. This information is not intended to replace advice given to you by your health care provider. Make sure you discuss any questions you have with your health care provider. Document Revised: 04/24/2019 Document Reviewed: 04/24/2019 Elsevier Patient Education  2022 [employer] Monitored Anesthesia Care, Care After This sheet gives you information about how to care for yourself after your procedure. Your health care provider may also give you more specific instructions. If you have problems or questions, contact your health care provider. What can I expect after the procedure? After the procedure, it is common to have: Tiredness. Forgetfulness about what happened after the procedure. Impaired judgment for important decisions. Nausea or vomiting. Some difficulty with balance. Follow these instructions at home: For the time period you were told by your health care provider:   Rest as needed. Do not participate in activities where you could fall or become injured.  Do not drive or use machinery. Do not drink alcohol. Do not take sleeping pills or medicines that cause drowsiness. Do not make important decisions or sign legal documents. Do not take care of children on your own. Eating and drinking Follow the diet that is recommended by your health care provider. Drink enough fluid to keep your urine pale yellow. If you vomit: Drink water, juice, or soup when you can drink without vomiting. Make sure you have little or no nausea before eating solid foods. General instructions Have a responsible adult stay with you for the time you are told. It is important to have someone help care for you until you are awake and alert. Take over-the-counter and prescription medicines only as told by your health care provider. If you  have sleep apnea, surgery and certain medicines can increase your risk for breathing problems. Follow instructions from your health care provider about wearing your sleep device: Anytime you are sleeping, including during daytime naps. While taking prescription pain medicines, sleeping medicines, or medicines that make you drowsy. Avoid smoking. Keep all follow-up visits as told by your health care provider. This is important. Contact a health care provider if: You keep feeling nauseous or you keep vomiting. You feel light-headed. You are still sleepy or having trouble with balance after 24 hours. You develop a rash. You have a fever. You have redness or swelling around the IV site. Get help right away if: You have trouble breathing. You have new-onset confusion at home. Summary For several hours after your procedure, you may feel tired. You may also be forgetful and have poor judgment. Have a responsible adult stay with you for the time you are told. It is important to have someone help care for you until you are awake and alert. Rest as told. Do not drive or operate machinery. Do not drink alcohol or take sleeping pills. Get help right away if you have trouble breathing, or if you suddenly become confused. This information is not intended to replace advice given to you by your health care provider. Make sure you discuss any questions you have with your health care provider. Document Revised: 06/06/2020 Document Reviewed: 08/24/2019 Elsevier Patient Education  2022 [employer].

## 2021-07-30 ENCOUNTER — Encounter (HOSPITAL_COMMUNITY)
Admission: RE | Admit: 2021-07-30 | Discharge: 2021-07-30 | Disposition: A | Discharge status: Home / Self Care | Payer: Medicare Other | Source: Ambulatory Visit | Attending: Cardiology | Admitting: Cardiology

## 2021-07-30 ENCOUNTER — Encounter (HOSPITAL_COMMUNITY): Payer: Self-pay | Admitting: Anesthesiology

## 2021-07-30 ENCOUNTER — Other Ambulatory Visit: Payer: Self-pay

## 2021-07-30 ENCOUNTER — Encounter (HOSPITAL_COMMUNITY): Payer: Self-pay

## 2021-07-30 ENCOUNTER — Emergency Department (HOSPITAL_COMMUNITY)
Admission: EM | Admit: 2021-07-30 | Discharge: 2021-07-30 | Disposition: A | Discharge status: Home / Self Care | Payer: Medicare Other | Attending: Emergency Medicine | Admitting: Emergency Medicine

## 2021-07-30 ENCOUNTER — Emergency Department (HOSPITAL_COMMUNITY): Payer: Medicare Other

## 2021-07-30 ENCOUNTER — Telehealth: Payer: Self-pay

## 2021-07-30 DIAGNOSIS — I482 Chronic atrial fibrillation, unspecified: Secondary | ICD-10-CM | POA: Diagnosis not present

## 2021-07-30 DIAGNOSIS — Z79899 Other long term (current) drug therapy: Secondary | ICD-10-CM | POA: Diagnosis not present

## 2021-07-30 DIAGNOSIS — E039 Hypothyroidism, unspecified: Secondary | ICD-10-CM | POA: Diagnosis not present

## 2021-07-30 DIAGNOSIS — F419 Anxiety disorder, unspecified: Secondary | ICD-10-CM | POA: Diagnosis not present

## 2021-07-30 DIAGNOSIS — J45909 Unspecified asthma, uncomplicated: Secondary | ICD-10-CM | POA: Insufficient documentation

## 2021-07-30 DIAGNOSIS — I2510 Atherosclerotic heart disease of native coronary artery without angina pectoris: Secondary | ICD-10-CM | POA: Diagnosis not present

## 2021-07-30 DIAGNOSIS — J449 Chronic obstructive pulmonary disease, unspecified: Secondary | ICD-10-CM | POA: Diagnosis not present

## 2021-07-30 DIAGNOSIS — Z7901 Long term (current) use of anticoagulants: Secondary | ICD-10-CM | POA: Insufficient documentation

## 2021-07-30 DIAGNOSIS — R000 Tachycardia, unspecified: Secondary | ICD-10-CM | POA: Diagnosis present

## 2021-07-30 DIAGNOSIS — R079 Chest pain, unspecified: Secondary | ICD-10-CM

## 2021-07-30 DIAGNOSIS — Z853 Personal history of malignant neoplasm of breast: Secondary | ICD-10-CM | POA: Insufficient documentation

## 2021-07-30 DIAGNOSIS — I119 Hypertensive heart disease without heart failure: Secondary | ICD-10-CM | POA: Insufficient documentation

## 2021-07-30 DIAGNOSIS — Z87891 Personal history of nicotine dependence: Secondary | ICD-10-CM | POA: Insufficient documentation

## 2021-07-30 LAB — TROPONIN I (HIGH SENSITIVITY)
Troponin I (High Sensitivity): 23 ng/L — ABNORMAL HIGH (ref <18)
Troponin I (High Sensitivity): 19 ng/L — ABNORMAL HIGH (ref <18)

## 2021-07-30 LAB — CBC
HCT: 39.2 % (ref 36.0–46.0)
Hemoglobin: 13.3 g/dL (ref 12.0–15.0)
MCH: 30.7 pg (ref 26.0–34.0)
MCHC: 33.9 g/dL (ref 30.0–36.0)
MCV: 90.5 fL (ref 80.0–100.0)
Platelets: 379 10*3/uL (ref 150–400)
RBC: 4.33 MIL/uL (ref 3.87–5.11)
RDW: 12.4 % (ref 11.5–15.5)
WBC: 6.7 10*3/uL (ref 4.0–10.5)
nRBC: 0 % (ref 0.0–0.2)

## 2021-07-30 LAB — BASIC METABOLIC PANEL
Anion gap: 8 (ref 5–15)
BUN: 25 mg/dL — ABNORMAL HIGH (ref 8–23)
CO2: 24 mmol/L (ref 22–32)
Calcium: 9.3 mg/dL (ref 8.9–10.3)
Chloride: 101 mmol/L (ref 98–111)
Creatinine, Ser: 1.13 mg/dL — ABNORMAL HIGH (ref 0.44–1.00)
GFR, Estimated: 54 mL/min — ABNORMAL LOW (ref >60)
Glucose, Bld: 115 mg/dL — ABNORMAL HIGH (ref 70–99)
Potassium: 4 mmol/L (ref 3.5–5.1)
Sodium: 133 mmol/L — ABNORMAL LOW (ref 135–145)

## 2021-07-30 LAB — MAGNESIUM: Magnesium: 1.9 mg/dL (ref 1.7–2.4)

## 2021-07-30 MED ORDER — ONDANSETRON HCL 4 MG/2ML IJ SOLN
4.0000 mg | Freq: Once | INTRAMUSCULAR | Status: AC
  Administered 2021-07-30: 4 mg via INTRAVENOUS
  Filled 2021-07-30: qty 2

## 2021-07-30 MED ORDER — ETOMIDATE 2 MG/ML IV SOLN
0.1500 mg/kg | Freq: Once | INTRAVENOUS | Status: AC
  Administered 2021-07-30: 12 mg via INTRAVENOUS
  Filled 2021-07-30: qty 10

## 2021-07-30 MED ORDER — SODIUM CHLORIDE 0.9 % IV BOLUS
1000.0000 mL | Freq: Once | INTRAVENOUS | Status: AC
Start: 2021-07-30 — End: 2021-07-30
  Administered 2021-07-30: 1000 mL via INTRAVENOUS

## 2021-07-30 MED ORDER — LORAZEPAM 2 MG/ML IJ SOLN
0.5000 mg | Freq: Once | INTRAMUSCULAR | Status: AC
  Administered 2021-07-30: 0.5 mg via INTRAVENOUS
  Filled 2021-07-30: qty 1

## 2021-07-30 MED ORDER — HYDROXYZINE HCL 25 MG PO TABS: 25.0000 mg | ORAL_TABLET | Freq: Four times a day (QID) | ORAL | 0 refills | Status: DC | PRN

## 2021-07-30 NOTE — Discharge Instructions (Addendum)
You have been converted back to sinus rhythm, please continue all home medication as prescribed.  Given a prescription for Vistaril this will help with your anxiety please take as prescribed, recommend exercising, mindful meditation, improving sleep hygiene this can help decrease anxiety.  Please follow-up with Dr. Wyline Mood for further evaluation.  Come back to the emergency department if you develop chest pain, shortness of breath, severe abdominal pain, uncontrolled nausea, vomiting, diarrhea.

## 2021-07-30 NOTE — ED Provider Notes (Signed)
[facility] EMERGENCY DEPARTMENT Provider Note   CSN: [id number] Arrival date & time: 07/30/21  1315     History Chief Complaint  Patient presents with   Tachycardia    Afib    [name] is a 65 y.o. female.  HPI  Patient with significant medical history including anxiety, A. fib currently on Eliquis chronic, CAD, CABG four-vessel done in 2016 recent EF of 40 to 45%, carotid stenosis, hypertension, presents to the emergency department with chief complaint of shortness of breath and fast heart rates.  Patient states that she was post go see her cardiologist today for a preop evaluation, as she was going to have a cardioversion tomorrow she states unfortunately her friend did not show up to pick her up and this caused her to have worsening anxiety.  She states that her chest got very tight and she had difficulty breathing and felt like her heart was racing.  She states that since she has been diagnosed with A. fib she has been having intermittent chest palpitations and shortness of breath, she states these mainly come on with exertion states she feels like her heart is racing and her chest will get tight, this will eventually resolve on its own.  She denies orthopnea and/or peripheral edema, she denies recent increase in caffeine use, denies illicit drug use, she does state that she has had some stress in her life and she is unsure of if her anxiety or the A. fib is making things worse.  She has no other complaints at this time.  She does not endorse fevers, chills, abdominal pain, nausea, vomiting, diarrhea.  After reviewing patient's chart patient is being followed by Dr. Wyline Mood, she has been on Eliquis for over a month's time, patient has not missed any dosages, she was scheduled for a cardioversion tomorrow and was supposed to see him today for a preop appointment.  Past Medical History:  Diagnosis Date   Acute renal failure (HCC)    Anemia    Anxiety    Arthritis    Asthma     Carotid artery disease (HCC)    a. Duplex 50-69% BICA 03/2014.   Chronic back pain    Collagen vascular disease (HCC)    COPD (chronic obstructive pulmonary disease) (HCC)    Coronary artery disease    a. s/p prior CABG 2005. b. BMS 02/2009 to VG-PDA. c. NSTEMI 08/2010 s/p PTCA/DES to SVG-PDA in previously stented area. d. DES to SVG to RCA on 05/24/14  c. LHC 06/04/2015 w/ no sig change in her anatomy and Rx managment   DDD (degenerative disc disease)    Depression    Diverticulitis    4 documented episodes by CT; most recent April 2012   DIVERTICULITIS OF COLON 07/14/2010   Qualifier: Diagnosis of  By: De Blanch.    GERD (gastroesophageal reflux disease)    History of blood transfusion    HTN (hypertension)    Hyperlipidemia    Hypertension    Hypothyroidism    Ischemic cardiomyopathy    a. EF 40% in 2012 by cath. b. improved to 50-55% by cath 05/2014  c. EF 35-40% by [facility] 05/2015   Kidney stones    Seizures (HCC)    Sinus bradycardia    Sleep apnea    a. Mild-to-moderate obstructive sleep apnea diagnosed in April 2012 by sleep study.   Tension headache     Patient Active Problem List   Diagnosis Date Noted   Encounter  to establish care 09/12/2020   Anxiety 09/12/2020   Insomnia 09/12/2020   Hypothyroidism 07/03/2020   Ischemic cardiomyopathy 07/03/2020   Erroneous encounter - disregard 09/17/2015   CAD S/P multiple PCI after CABG 06/04/2015   Obesity-BMI 41 06/04/2015   Unstable angina (HCC) 06/03/2015   Angina, class III - progressed despite medical therapy 10/01/2013   LLQ abdominal pain 12/02/2011   Epigastric pain 04/19/2011   Anemia 04/19/2011   Diverticulitis 01/15/2011   DIARRHEA, CHRONIC 07/14/2010   Hx of CABG 2005 01/23/2010   Headache 01/23/2010   Hyperlipidemia with target LDL less than 70 05/27/2009   Essential hypertension 05/27/2009   PVD- 50-69% bilat ICA  05/27/2009   Chronic obstructive pulmonary disease (COPD) (HCC) 05/27/2009   GERD  05/27/2009   Seizure (HCC) 05/27/2009    Past Surgical History:  Procedure Laterality Date   APPENDECTOMY  ~ 1970   CARDIAC CATHETERIZATION  Jan 2012   patent stents in RCA graft; CAD stable   CARDIAC CATHETERIZATION N/A 06/03/2015   Procedure: Left Heart Cath and Cors/Grafts Angiography;  Surgeon: Lyn Records, MD;  Location: [facility] INVASIVE CV LAB;  Service: Cardiovascular;  Laterality: N/A;   CARPAL TUNNEL WITH CUBITAL TUNNEL Right 2001   CORONARY ANGIOPLASTY WITH STENT PLACEMENT  2011   Promus Premier DES, 3.5 mm x 12 mm, for 99% proximal stent ISR in the SVG-RPDA    CORONARY ANGIOPLASTY WITH STENT PLACEMENT  05/24/2014   CORONARY ARTERY BYPASS GRAFT  2005   LIMA to AL, SVG to LAD, SVG to Cx, SVG-PDA   KNEE ARTHROSCOPY W/ ACL RECONSTRUCTION Left 2001   LAPAROSCOPIC CHOLECYSTECTOMY  2010   LEFT HEART CATHETERIZATION WITH CORONARY/GRAFT ANGIOGRAM N/A 05/24/2014   Procedure: LEFT HEART CATHETERIZATION WITH CORONARY/GRAFT ANGIOGRAM;  Surgeon: Marykay Lex, MD; EF 50-55%, LAD & D1 100%, LIMA-D1 OK, CFX OK, OM1 & OM2 100%, lat OM branch 100%, RCA 100%; pSVG-RPDA 99% rx w/ scoring balloon PTCA & 3.5 x 12 mm Promus DES; SVG-LAD, SVG-OM known occluded       TUBAL LIGATION  ~ 1989   VAGINAL HYSTERECTOMY  ~ 1993     OB History   No obstetric history on file.     Family History  Problem Relation Age of Onset   Heart attack Mother    Heart disease Mother    Breast cancer Mother    Heart attack Father    Heart disease Father    Colon cancer Neg Hx     Social History   Tobacco Use   Smoking status: Former    Packs/day: 0.50    Years: 21.00    Pack years: 10.50    Types: Cigarettes    Start date: 03/19/1993    Quit date: 07/29/2015    Years since quitting: 6.0   Smokeless tobacco: Never   Tobacco comments:    smokes some time/go weeks without smoking  Vaping Use   Vaping Use: Never used  Substance Use Topics   Alcohol use: No    Alcohol/week: 0.0 standard drinks   Drug use:  Not Currently    Types: Cocaine, Marijuana    Comment: last month    Home Medications Prior to Admission medications   Medication Sig Start Date End Date Taking? Authorizing Provider  acetaminophen (TYLENOL) 650 MG CR tablet Take 1,300 mg by mouth every 8 (eight) hours as needed for pain.   Yes [provider]  albuterol (VENTOLIN HFA) 108 (90 Base) MCG/ACT inhaler Inhale 2 puffs into  the lungs every 6 (six) hours as needed for wheezing or shortness of breath.   Yes [provider]  amLODipine (NORVASC) 10 MG tablet TAKE ONE TABLET BY MOUTH ONCE DAILY. 02/28/21  Yes Branch, Dorothe Pea, MD  apixaban (ELIQUIS) 5 MG TABS tablet Take 1 tablet (5 mg total) by mouth 2 (two) times daily. 06/16/21  Yes Netta Neat., NP  diphenhydrAMINE (BENADRYL) 25 MG tablet Take 25 mg by mouth daily as needed for allergies.   Yes [provider]  ezetimibe (ZETIA) 10 MG tablet TAKE (1) TABLET BY MOUTH ONCE DAILY. Patient taking differently: Take 10 mg by mouth daily. 02/28/21  Yes Branch, Dorothe Pea, MD  furosemide (LASIX) 80 MG tablet TAKE ONE TABLET DAILY. 06/17/21  Yes BranchDorothe Pea, MD  hydrOXYzine (ATARAX/VISTARIL) 25 MG tablet Take 1 tablet (25 mg total) by mouth every 6 (six) hours as needed for anxiety. 07/30/21  Yes Carroll Sage, PA-C  isosorbide mononitrate (IMDUR) 60 MG 24 hr tablet Take 1 tablet (60 mg total) by mouth daily. 06/16/21  Yes Netta Neat., NP  levETIRAcetam (KEPPRA) 250 MG tablet TAKE (1) TABLET BY MOUTH TWICE DAILY. Patient taking differently: Take 250 mg by mouth 2 (two) times daily. 06/08/21  Yes Anabel Halon, MD  levothyroxine (SYNTHROID) 75 MCG tablet Take 1 tablet (75 mcg total) by mouth daily before breakfast. 06/16/21  Yes Netta Neat., NP  Multiple Vitamin (MULTIVITAMIN WITH MINERALS) TABS tablet Take 1 tablet by mouth 2 (two) times daily. Patient taking differently: Take 1 tablet by mouth daily. 08/28/13  Yes Branch, Dorothe Pea,  MD  nitroGLYCERIN (NITROSTAT) 0.4 MG SL tablet DISSOLVE 1 TABLET UNDER TONGUE EVERY 5 MINUTES UP TO 15 MIN FOR CHESTPAIN. IF NO RELIEF CALL 911. 06/16/21  Yes Netta Neat., NP  Omega 3 1000 MG CAPS Take 1 capsule (1,000 mg total) by mouth 2 (two) times daily. 08/27/20  Yes Netta Neat., NP  omeprazole (PRILOSEC) 20 MG capsule TAKE (1) CAPSULE BY MOUTH ONCE DAILY. Patient taking differently: Take 20 mg by mouth daily. 02/07/21  Yes Branch, Dorothe Pea, MD  potassium chloride SA (KLOR-CON) 20 MEQ tablet TAKE (1) TABLET BY MOUTH EACH MORNING. Patient taking differently: Take 20 mEq by mouth daily. 02/28/21  Yes Branch, Dorothe Pea, MD  sacubitril-valsartan (ENTRESTO) 24-26 MG Take 1 tablet by mouth 2 (two) times daily. 07/24/21  Yes Antoine Poche, MD  benazepril (LOTENSIN) 10 MG tablet Take 10 mg by mouth daily.    [provider]  cetirizine (ZYRTEC ALLERGY) 10 MG tablet Take 1 tablet (10 mg total) by mouth daily. Patient not taking: No sig reported 06/05/20   Durward Parcel, FNP  FLUoxetine (PROZAC) 40 MG capsule Take 1 capsule (40 mg total) by mouth daily. Patient not taking: No sig reported 09/12/20   Anabel Halon, MD  metoprolol succinate (TOPROL-XL) 50 MG 24 hr tablet Take 1 tablet (50 mg total) by mouth daily. Patient not taking: No sig reported 07/25/20   Netta Neat., NP  Omega-3 Fatty Acids (FISH OIL) 500 MG CAPS Take by mouth. Patient not taking: Reported on 07/30/2021    [provider]  rosuvastatin (CRESTOR) 5 MG tablet Take 1 tablet (5 mg total) by mouth daily. Patient not taking: No sig reported 07/25/20   Netta Neat., NP  zolpidem (AMBIEN) 5 MG tablet TAKE ONE TABLET BY MOUTH AT BEDTIME AS NEEDED. Patient not taking:  No sig reported 03/10/21   Heather Roberts, NP    Allergies    Pneumococcal vaccines, Adhesive [tape], Crestor [rosuvastatin], Lipitor [atorvastatin], and Morphine and related  Review of Systems   Review of Systems   Constitutional:  Negative for chills and fever.  HENT:  Negative for congestion.   Respiratory:  Positive for shortness of breath.   Cardiovascular:  Positive for chest pain and palpitations. Negative for leg swelling.  Gastrointestinal:  Negative for abdominal pain, diarrhea, nausea and vomiting.  Genitourinary:  Negative for enuresis.  Musculoskeletal:  Negative for back pain.  Skin:  Negative for rash.  Neurological:  Negative for dizziness.  Hematological:  Does not bruise/bleed easily.  Psychiatric/Behavioral:  The patient is nervous/anxious.    Physical Exam Updated Vital Signs BP 92/68 (BP Location: Left Arm)   Pulse 95   Temp 97.8 F (36.6 C) (Oral)   Resp 19   Ht 5\' 3"  (1.6 m)   Wt 93.4 kg   SpO2 98%   BMI 36.49 kg/m   Physical Exam Vitals and nursing note reviewed.  Constitutional:      General: She is not in acute distress.    Appearance: She is not ill-appearing.     Comments: Patient appears anxious on my exam, but nontoxic-appearing, no acute distress.  HENT:     Head: Normocephalic and atraumatic.     Nose: No congestion.     Mouth/Throat:     Mouth: Mucous membranes are moist.     Pharynx: Oropharynx is clear.  Eyes:     Conjunctiva/sclera: Conjunctivae normal.  Cardiovascular:     Rate and Rhythm: Tachycardia present. Rhythm irregular.     Pulses: Normal pulses.     Heart sounds: No murmur heard.   No friction rub. No gallop.  Pulmonary:     Effort: No respiratory distress.     Breath sounds: No wheezing, rhonchi or rales.  Abdominal:     Palpations: Abdomen is soft.     Tenderness: There is no abdominal tenderness. There is no right CVA tenderness or left CVA tenderness.  Musculoskeletal:     Right lower leg: No edema.     Left lower leg: No edema.  Skin:    General: Skin is warm and dry.  Neurological:     Mental Status: She is alert.  Psychiatric:        Mood and Affect: Mood normal.    ED Results / Procedures / Treatments    Labs (all labs ordered are listed, but only abnormal results are displayed) Labs Reviewed  BASIC METABOLIC PANEL - Abnormal; Notable for the following components:      Result Value   Sodium 133 (*)    Glucose, Bld 115 (*)    BUN 25 (*)    Creatinine, Ser 1.13 (*)    GFR, Estimated 54 (*)    All other components within normal limits  TROPONIN I (HIGH SENSITIVITY) - Abnormal; Notable for the following components:   Troponin I (High Sensitivity) 23 (*)    All other components within normal limits  TROPONIN I (HIGH SENSITIVITY) - Abnormal; Notable for the following components:   Troponin I (High Sensitivity) 19 (*)    All other components within normal limits  CBC  MAGNESIUM    EKG None  Radiology DG Chest Port 1 View  Result Date: 07/30/2021 CLINICAL DATA:  Atrial fibrillation Plan cardioversion Woke up with shortness of breath and chest pain EXAM: PORTABLE CHEST 1 VIEW COMPARISON:  08/21/2020 FINDINGS: Heart size within normal limits. No pulmonary vascular congestion. Lungs are clear. Postsurgical changes of CABG again seen. Multiple metallic BBs again seen in the upper chest soft tissues. There is chronic right AC joint separation. IMPRESSION: No acute cardiopulmonary process. Electronically Signed   By: Acquanetta Belling M.D.   On: 07/30/2021 14:37    Procedures .Critical Care Performed by: Carroll Sage, PA-C Authorized by: Carroll Sage, PA-C   Critical care provider statement:    Critical care time (minutes):  40   Critical care time was exclusive of:  Separately billable procedures and treating other patients   Critical care was necessary to treat or prevent imminent or life-threatening deterioration of the following conditions:  Cardiac failure (Cardioversion, sedation)   I assumed direction of critical care for this patient from another provider in my specialty: no   .Cardioversion  Date/Time: 07/30/2021 5:45 PM Performed by: Carroll Sage,  PA-C Authorized by: Carroll Sage, PA-C   Consent:    Consent obtained:  Verbal   Consent given by:  Patient   Risks discussed:  Cutaneous burn, death, induced arrhythmia and pain   Alternatives discussed:  No treatment, rate-control medication, alternative treatment, referral, anti-coagulation medication, delayed treatment and observation Pre-procedure details:    Cardioversion basis:  Emergent   Rhythm:  Atrial fibrillation   Electrode placement:  Anterior-posterior Patient sedated: Yes. Refer to sedation procedure documentation for details of sedation.  Attempt one:    Cardioversion mode:  Synchronous   Waveform:  Monophasic   Shock (Joules):  100   Shock outcome:  Conversion to normal sinus rhythm Post-procedure details:    Patient status:  Awake   Patient tolerance of procedure:  Tolerated well, no immediate complications   Medications Ordered in ED Medications  sodium chloride 0.9 % bolus 1,000 mL (0 mLs Intravenous Stopped 07/30/21 1740)  ondansetron (ZOFRAN) injection 4 mg (4 mg Intravenous Given 07/30/21 1418)  LORazepam (ATIVAN) injection 0.5 mg (0.5 mg Intravenous Given 07/30/21 1418)  etomidate (AMIDATE) injection 14.02 mg (12 mg Intravenous Given by Other 07/30/21 1442)    ED Course  I have reviewed the triage vital signs and the nursing notes.  Pertinent labs & imaging results that were available during my care of the patient were reviewed by me and considered in my medical decision making (see chart for details).    MDM Rules/Calculators/A&P                          Initial impression-patient presents with shortness of breath and rapid heart rate.  She is alert, does not appear in distress, vital signs notable for tachycardia heart rate in the 100s to 120s.  Will obtain chest pain work-up, consult with cardiology for further evaluation.  Work-up-CBC is unremarkable, BMP shows sodium of 133, glucose 115, BUN of 25, creatinine 1.13, GFR 54, first troponin  is 23, second troponin is 19. magnesium of 1.9, chest x-ray is unremarkable.  Reassessment-patient was updated on recommendation from cardiology she is agreement this plan.  Risks and benefits were discussed and were not limited to death, pain, burns, unsuccessful all questions were answered, patient was agreement this plan.  Dr. Wilkie Aye was present during conversion, she is sedated the patient, and 100 J was administered, patient went to the sinus rhythm, and tolerated the procedure well.  Heart rates now in the mid 80s.  Will monitor until she is back to baseline.  Patient has a  noted downturned troponin, will discuss with cardiology for further recommendations.  Patient is reassessed, is resting comfortably, has no complaints, she remains in sinus rhythm, she is tolerating p.o., she is agreed for discharge at this time.  Consult-spoke with Dr. Wyline Mood of cardiology he recommends cardioversion at this time, if successful patient can be discharged home.  Spoke with Dr. Wyline Mood, he feels likely elevated troponin was from demand ischemia, since she is chest pain-free no significant increase in troponin she can be discharged home follow-up with cards for reevaluation, does not recommend any discontinuation of her medications.  Rule out- I have low suspicion for ACS as history is atypical,  EKG without signs of ischemia, she does have an elevated troponin but it is downtrending at this time, likely this was secondary due to demand ischemia as well as poor excretion as she does have an elevated creatinine.  Low suspicion for PE as patient denies pleuritic chest pain, she is nontachypneic, nonhypoxic, she was notably tachycardic but this is secondary due to A. fib she is currently in sinus rhythm without any complaints at this time, she also has been on anticoagulant making this unlikely.  Low suspicion for AAA or aortic dissection as history is atypical, patient has low risk factors.  Low suspicion for  systemic infection as patient is nontoxic-appearing, vital signs reassuring, no obvious source infection noted on exam.   Plan-  Shortness of breath A. fib since resolved-patient has been converted from A. fib into sinus rhythm, will have her continue with all home medications, follow-up with cardiology for further recommendations.  Gave her strict return precautions. Anxiety-likely patient's chest discomfort and shortness of breath are multifactorial I feel that anxiety has played a part in this, will provide her with Vistaril, would recommend that she follows up with her PCP anxiety management if necessary.  Vital signs have remained stable, no indication for hospital admission.  Patient discussed with attending and they agreed with assessment and plan.  Patient given at home care as well strict return precautions.  Patient verbalized that they understood agreed to said plan.  Final Clinical Impression(s) / ED Diagnoses Final diagnoses:  Chronic atrial fibrillation [facility])  Anxiety    Rx / DC Orders ED Discharge Orders          Ordered    hydrOXYzine (ATARAX/VISTARIL) 25 MG tablet  Every 6 hours PRN        07/30/21 1746             Carroll Sage, PA-C 07/30/21 1748    Horton, Clabe Seal, DO 08/05/21 [phone]

## 2021-07-30 NOTE — Progress Notes (Signed)
Patient calls very SOB on the phone asking for cardiology.  I asked if it was something I could help her with and she stated that she couldn't breathe and needed someone to help her.  911 was dispatched and sent to her home.  I followed up and the paramedics were at her home and preparing her for transport to the hospital.

## 2021-07-30 NOTE — ED Triage Notes (Signed)
Pt arrived to ED via [facility] for AFIB. Pt is scheduled for cardioversion tomorrow. Pt woke up SOB and chest pain, pt has hx of anxiety and is out of meds.

## 2021-07-30 NOTE — ED Provider Notes (Signed)
I was the supervising attending working with the PA.  I personally interviewed, examined and participated in the medical decision making/disposition of this patient.  HPI: 65 year old female past medical history of A. fib who is planned for cardioversion tomorrow presents with shortness of breath, anxiety.  She is adequately anticoagulated and compliant with her medication.  Follows with Dr. Wyline Mood for cardiology.  PE: Anxious appearing, nontoxic GEN- no acute distress HEENT- atraumatic, EOMI CARDIAC-irregular RESPIRATORY- effort nml ABDOMEN- soft, benign MSK- no obvious deformity NEURO- baseline  MDM: EKG shows atrial fibrillation, heart rates have mainly been between 80-100.  She is otherwise hemodynamically stable.  Dr. Wyline Mood, cardiology was consulted by the PA who recommends performing a cardioversion here.  Blood work is reassuring, no acute electrolyte abnormalities.  Cardioversion was done successfully with half dose etomidate.  Repeat EKG shows normal sinus rhythm.  Patient tolerated well, no complications.  Plan to monitor the patient for about an hour at the recommendation of cardiology.  Anticipate discharge.  CRITICAL CARE Performed by: Clabe Seal Carmon Sahli   Total critical care time: 30 minutes  Critical care time was exclusive of separately billable procedures and treating other patients.  Critical care was necessary to treat or prevent imminent or life-threatening deterioration.  Critical care was time spent personally by me on the following activities: development of treatment plan with patient and/or surrogate as well as nursing, discussions with consultants, evaluation of patient's response to treatment, examination of patient, obtaining history from patient or surrogate, ordering and performing treatments and interventions, ordering and review of laboratory studies, ordering and review of radiographic studies, pulse oximetry and re-evaluation of patient's condition.     Rozelle Logan, DO 07/30/21 1509

## 2021-07-30 NOTE — ED Notes (Signed)
Repaged  Dr Wyline Mood to [phone]

## 2021-07-30 NOTE — Sedation Documentation (Signed)
Patient cardioverted at 100j by Chrissie Noa PA

## 2021-07-30 NOTE — ED Notes (Signed)
Patient given fluid and snack per provider orders.  Able to eat and drink without any problems.

## 2021-07-30 NOTE — Telephone Encounter (Signed)
Per Dr. Wyline Mood, pt's outpatient cardioversion scheduled for 07/31/21 needs to be cancelled. Left a message for short stay schedulers at [facility] d/t short stay closing at 3:30pm M-T, to cancel procedure. Advised by Dr. Wyline Mood that pt also needs to stop Effient d/t pt being on Eliquis and asa as well. Effient removed from pt's medication list. Pt to have follow up APP appt in 2-3 week- Scheduled this for Nov. 8th with Verita Schneiders, NP in [place].  Called pt with no success. No voicemail set up.

## 2021-07-31 ENCOUNTER — Encounter (HOSPITAL_COMMUNITY): Admission: RE | Payer: Self-pay | Source: Home / Self Care

## 2021-07-31 ENCOUNTER — Ambulatory Visit (HOSPITAL_COMMUNITY): Admission: RE | Admit: 2021-07-31 | Payer: Medicare Other | Source: Home / Self Care | Admitting: Cardiology

## 2021-07-31 SURGERY — CARDIOVERSION
Anesthesia: Monitor Anesthesia Care

## 2021-08-01 NOTE — Telephone Encounter (Signed)
Attempted to reach pt to inform her of her upcoming appt and medication changes with no success x2. Spoke with daughter who will have pt call office.

## 2021-08-04 ENCOUNTER — Ambulatory Visit (INDEPENDENT_AMBULATORY_CARE_PROVIDER_SITE_OTHER): Payer: Medicare Other | Admitting: Orthopedic Surgery

## 2021-08-04 ENCOUNTER — Ambulatory Visit: Payer: Medicare Other

## 2021-08-04 ENCOUNTER — Telehealth: Payer: Self-pay | Admitting: Cardiology

## 2021-08-04 ENCOUNTER — Encounter: Payer: Self-pay | Admitting: Orthopedic Surgery

## 2021-08-04 VITALS — BP 111/73 | HR 95 | Ht 63.0 in | Wt 189.8 lb

## 2021-08-04 DIAGNOSIS — M25362 Other instability, left knee: Secondary | ICD-10-CM

## 2021-08-04 DIAGNOSIS — M25562 Pain in left knee: Secondary | ICD-10-CM

## 2021-08-04 DIAGNOSIS — S80252A Superficial foreign body, left knee, initial encounter: Secondary | ICD-10-CM

## 2021-08-04 DIAGNOSIS — M2342 Loose body in knee, left knee: Secondary | ICD-10-CM | POA: Diagnosis not present

## 2021-08-04 DIAGNOSIS — G8929 Other chronic pain: Secondary | ICD-10-CM

## 2021-08-04 DIAGNOSIS — I6523 Occlusion and stenosis of bilateral carotid arteries: Secondary | ICD-10-CM | POA: Diagnosis not present

## 2021-08-04 MED ORDER — TRAMADOL HCL 50 MG PO TABS: 50.0000 mg | ORAL_TABLET | Freq: Four times a day (QID) | ORAL | 0 refills | Status: AC | PRN

## 2021-08-04 NOTE — Patient Instructions (Signed)
While we are working on your approval for CT Arthrogram please go ahead and call to schedule your appointment with [name] within at least one (1) week.   Central Scheduling [phone]

## 2021-08-04 NOTE — Telephone Encounter (Signed)
STAT if patient feels like he/she is going to faint   Are you dizzy now?  Yes   Do you feel faint or have you passed out?  Patient states she feels faint right now   Do you have any other symptoms?  Shakes   Have you checked your HR and BP (record if available)?  Unable to check BP/HR, states BP cuff is inaccurate.

## 2021-08-04 NOTE — Telephone Encounter (Signed)
Reports eliquis and entresto is causing shakes and unable to rest. Also says it causes nausea.  Also reports heart has been racing since she has been on eliquis and entresto Reports dizziness that is worse than before Says symptoms improve when its time to take 2nd dose of eliquis and entresto Denies sob or chest pain Home BP monitor is broken so unable to check BP and HR Advised that message would be sent to provider to see if medications can be changed or adjusted Advised if she develops worsening symptoms, to go to the ED for an evaluation Says she has an appointment with Ortho today and she may not be home when call returned Verbalized understanding
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
## 2021-08-05 NOTE — Telephone Encounter (Signed)
Patient informed and verbalized understanding of plan. 

## 2021-08-05 NOTE — Telephone Encounter (Signed)
Can try holding entresto x 2 days and then update [place]. Continue eliqius, I doubt both meds would be causing symptoms simultaneously  J Glorine Hanratty MD

## 2021-08-06 ENCOUNTER — Other Ambulatory Visit: Payer: Self-pay | Admitting: Cardiology

## 2021-08-06 ENCOUNTER — Telehealth: Payer: Self-pay | Admitting: Cardiology

## 2021-08-06 DIAGNOSIS — R0789 Other chest pain: Secondary | ICD-10-CM

## 2021-08-06 NOTE — Telephone Encounter (Signed)
Patient could not remember which medication she needs to stop following her Cardioversion. She had some had some emotional stuff happen at her house after the office called and she can't remember what to take and not take.  Please follow up

## 2021-08-07 MED ORDER — SACUBITRIL-VALSARTAN 24-26 MG PO TABS: 1.0000 | ORAL_TABLET | Freq: Two times a day (BID) | ORAL | Status: DC

## 2021-08-07 NOTE — Telephone Encounter (Signed)
Per telephone note on 08/04/2021 - Note Can try holding entresto x 2 days and then update [place]. Continue eliqius, I doubt both meds would be causing symptoms simultaneously   J BrancH MD    Patient notified and verbalized understanding.

## 2021-08-11 NOTE — Progress Notes (Deleted)
Cardiology Office Note  Date: 08/11/2021   ID: [name], DOB [birth year], MRN [id number]  PCP:  Toni Spar, MD  Cardiologist:  Carlyle Dolly, MD Electrophysiologist:  None   Chief Complaint: 2 to 3-week follow-up  History of Present Illness: [name] is a 65 y.o. female with a history of HTN, PVD, CAD, ischemic cardiomyopathy, COPD, HLD.  She recently saw Dr. Harl Bowie on 08/01/2021.  She was on maximum dose of Norvasc.  Heart rates were low and plans were not to increase beta-blocker.  Imdur was contributing to headaches.  Dose was lowered to 30 mg daily.  Previous stress test in September 2018 without significant ischemia.  No recent ischemic symptoms.  Benazepril was discontinued.  Entresto started 48 hours later at 26/26 mg p.o. twice daily.  Aspirin was stopped since on Eliquis.  Plan was to be off Effient as well.  Continue to monitor carotid stenosis which was moderate by last sound.  Continue current antihypertensive medications.  She continues to be symptomatic in spite of rate control.  Plan was for outpatient cardioversion.  On 07/30/2021 she underwent 1 synchronous monophasic shock of 100 J with conversion to normal sinus rhythm.  She tolerated the procedure well without complications.  She called on 08/04/2021 reporting Eliquis and Entresto were causing shakes and being unable to rest.  She also complained of nausea and heart racing since being on Eliquis.  She reported dizziness that was worse than prior.  He stated his symptoms were improved would was a time to take the second dose of Eliquis and Entresto.   Dr. Harl Bowie responded she could try holding Entresto for 2 days and update.  She was continue Eliquis.  Past Medical History:  Diagnosis Date   Acute renal failure (HCC)    Anemia    Anxiety    Arthritis    Asthma    Carotid artery disease ([place])    a. Duplex 50-69% BICA 03/2014.   Chronic back pain    Collagen vascular disease (HCC)    COPD  (chronic obstructive pulmonary disease) (HCC)    Coronary artery disease    a. s/p prior CABG 2005. b. BMS 02/2009 to VG-PDA. c. NSTEMI 08/2010 s/p PTCA/DES to SVG-PDA in previously stented area. d. DES to SVG to RCA on 05/24/14  c. LHC 06/04/2015 w/ no sig change in her anatomy and Rx managment   DDD (degenerative disc disease)    Depression    Diverticulitis    4 documented episodes by CT; most recent April 2012   DIVERTICULITIS OF COLON 07/14/2010   Qualifier: Diagnosis of  By: Westly Pam.    GERD (gastroesophageal reflux disease)    History of blood transfusion    HTN (hypertension)    Hyperlipidemia    Hypertension    Hypothyroidism    Ischemic cardiomyopathy    a. EF 40% in 2012 by cath. b. improved to 50-55% by cath 05/2014  c. EF 35-40% by [facility] 05/2015   Kidney stones    Seizures ([place])    Sinus bradycardia    Sleep apnea    a. Mild-to-moderate obstructive sleep apnea diagnosed in April 2012 by sleep study.   Tension headache     Past Surgical History:  Procedure Laterality Date   APPENDECTOMY  ~ [place]  Jan 2012   patent stents in RCA graft; CAD stable   CARDIAC CATHETERIZATION N/A 06/03/2015   Procedure: Left Heart Cath and Cors/Grafts  Angiography;  Surgeon: Belva Crome, MD;  Location: [place] [facility];  Service: Cardiovascular;  Laterality: N/A;   CARPAL TUNNEL WITH CUBITAL TUNNEL Right 2001   CORONARY ANGIOPLASTY WITH STENT PLACEMENT  2011   Promus Premier DES, 3.5 mm x 12 mm, for 99% proximal stent ISR in the SVG-RPDA    CORONARY ANGIOPLASTY WITH STENT PLACEMENT  05/24/2014   CORONARY ARTERY BYPASS GRAFT  2005   LIMA to AL, SVG to LAD, SVG to Cx, SVG-PDA   KNEE ARTHROSCOPY W/ ACL RECONSTRUCTION Left 2001   LAPAROSCOPIC CHOLECYSTECTOMY  2010   LEFT HEART CATHETERIZATION WITH CORONARY/GRAFT ANGIOGRAM N/A 05/24/2014   Procedure: LEFT HEART CATHETERIZATION WITH Beatrix Fetters;  Surgeon: Leonie Man, MD; EF 50-55%, LAD & D1  100%, LIMA-D1 OK, CFX OK, OM1 & OM2 100%, lat OM branch 100%, RCA 100%; pSVG-RPDA 99% rx w/ scoring balloon PTCA & 3.5 x 12 mm Promus DES; SVG-LAD, SVG-OM known occluded       TUBAL LIGATION  ~ [place]  ~ 1993    Current Outpatient Medications  Medication Sig Dispense Refill   acetaminophen (TYLENOL) 650 MG CR tablet Take 1,300 mg by mouth every 8 (eight) hours as needed for pain.     albuterol (VENTOLIN HFA) 108 (90 Base) MCG/ACT inhaler Inhale 2 puffs into the lungs every 6 (six) hours as needed for wheezing or shortness of breath.     amLODipine (NORVASC) 10 MG tablet TAKE ONE TABLET BY MOUTH ONCE DAILY. 90 tablet 3   apixaban (ELIQUIS) 5 MG TABS tablet Take 1 tablet (5 mg total) by mouth 2 (two) times daily. 60 tablet 6   benazepril (LOTENSIN) 10 MG tablet Take 10 mg by mouth daily.     cetirizine (ZYRTEC ALLERGY) 10 MG tablet Take 1 tablet (10 mg total) by mouth daily. 30 tablet 0   ezetimibe (ZETIA) 10 MG tablet TAKE (1) TABLET BY MOUTH ONCE DAILY. (Patient taking differently: Take 10 mg by mouth daily.) 90 tablet 3   furosemide (LASIX) 80 MG tablet TAKE ONE TABLET DAILY. 90 tablet 3   hydrOXYzine (ATARAX/VISTARIL) 25 MG tablet Take 1 tablet (25 mg total) by mouth every 6 (six) hours as needed for anxiety. 12 tablet 0   isosorbide mononitrate (IMDUR) 60 MG 24 hr tablet Take 1 tablet (60 mg total) by mouth daily. 90 tablet 3   levETIRAcetam (KEPPRA) 250 MG tablet TAKE (1) TABLET BY MOUTH TWICE DAILY. (Patient taking differently: Take 250 mg by mouth 2 (two) times daily.) 180 tablet 0   levothyroxine (SYNTHROID) 75 MCG tablet Take 1 tablet (75 mcg total) by mouth daily before breakfast. 90 tablet 3   metoprolol succinate (TOPROL-XL) 50 MG 24 hr tablet Take 1 tablet (50 mg total) by mouth daily. 90 tablet 1   Multiple Vitamin (MULTIVITAMIN WITH MINERALS) TABS tablet Take 1 tablet by mouth 2 (two) times daily. (Patient taking differently: Take 1 tablet by mouth daily.) 25  tablet 4   nitroGLYCERIN (NITROSTAT) 0.4 MG SL tablet DISSOLVE 1 TABLET UNDER TONGUE EVERY 5 MINUTES UP TO 15 MIN FOR CHESTPAIN. IF NO RELIEF CALL 911. 25 tablet 3   Omega 3 1000 MG CAPS Take 1 capsule (1,000 mg total) by mouth 2 (two) times daily.     omeprazole (PRILOSEC) 20 MG capsule TAKE (1) CAPSULE BY MOUTH ONCE DAILY. 90 capsule 0   potassium chloride SA (KLOR-CON) 20 MEQ tablet TAKE (1) TABLET BY MOUTH EACH MORNING. (Patient taking differently: Take  20 mEq by mouth daily.) 90 tablet 3   rosuvastatin (CRESTOR) 5 MG tablet Take 1 tablet (5 mg total) by mouth daily. 90 tablet 1   sacubitril-valsartan (ENTRESTO) 24-26 MG Take 1 tablet by mouth 2 (two) times daily. (08/07/2021 - holding currently)     traMADol (ULTRAM) 50 MG tablet Take 1 tablet (50 mg total) by mouth every 6 (six) hours as needed for up to 7 days. 28 tablet 0   No current facility-administered medications for this visit.   Allergies:  Pneumococcal vaccines, Adhesive [tape], Crestor [rosuvastatin], Lipitor [atorvastatin], and Morphine and related   Social History: The patient  reports that she quit smoking about 6 years ago. Her smoking use included cigarettes. She started smoking about 28 years ago. She has a 10.50 pack-year smoking history. She has never used smokeless tobacco. She reports that she does not currently use drugs after having used the following drugs: Cocaine and Marijuana. She reports that she does not drink alcohol.   Family History: The patient's family history includes Breast cancer in her mother; Heart attack in her father and mother; Heart disease in her father and mother.   ROS:  Please see the history of present illness. Otherwise, complete review of systems is positive for none.  All other systems are reviewed and negative.   Physical Exam: VS:  There were no vitals taken for this visit., BMI There is no height or weight on file to calculate BMI.  Wt Readings from Last 3 Encounters:  08/04/21 189 lb  12.8 oz (86.1 kg)  07/30/21 206 lb (93.4 kg)  07/24/21 195 lb (88.5 kg)    General: Patient appears comfortable at rest. HEENT: Conjunctiva and lids normal, oropharynx clear with moist mucosa. Neck: Supple, no elevated JVP or carotid bruits, no thyromegaly. Lungs: Clear to auscultation, nonlabored breathing at rest. Cardiac: Regular rate and rhythm, no S3 or significant systolic murmur, no pericardial rub. Abdomen: Soft, nontender, no hepatomegaly, bowel sounds present, no guarding or rebound. Extremities: No pitting edema, distal pulses 2+. Skin: Warm and dry. Musculoskeletal: No kyphosis. Neuropsychiatric: Alert and oriented x3, affect grossly appropriate.  ECG:  {EKG/Telemetry Strips Reviewed:[phone]}  Recent Labwork: 07/30/2021: BUN 25; Creatinine, Ser 1.13; Hemoglobin 13.3; Magnesium 1.9; Platelets 379; Potassium 4.0; Sodium 133     Component Value Date/Time   CHOL 248 (H) 02/14/2016 0723   TRIG 208 (H) 02/14/2016 0723   HDL 43 (L) 02/14/2016 0723   CHOLHDL 5.8 (H) 02/14/2016 0723   VLDL 42 (H) 02/14/2016 0723   LDLCALC 163 (H) 02/14/2016 0723    Other Studies Reviewed Today:  Cath 2012   Left main coronary was normal.   Left anterior descending artery was occluded just after the septal   perforator. First diagonal branch was subtotally occluded. There was   some filling of the distal LAD and diagonals from collateral branches   from the LIMA. Circumflex coronary artery was occluded in the   midvessel. The AV groove branch was patent. There was a small first   obtuse marginal branch with 30-40% multiple lesions. There was a small   second obtuse marginal branch with 30-40% discrete lesions. There is a   very large obtuse marginal branch seen filling from collaterals from the   left internal mammary artery.   Right coronary artery was 100% occluded proximally with both left-to-   right and right-to-right collaterals. The distal PDA also filled from   the vein graft  to the right coronary artery.   The  left internal mammary artery was widely patent. It also filled the   distal right large obtuse marginal branch and filled retrograde to a   smaller diagonal branch.   There was known occlusion of the 2 left-sided vein grafts. The   saphenous vein graft to the right coronary artery was widely patent.   Bare-metal stent in the mid-to-distal vessel widely patent. There was   poor runoff to the vessel primarily being retrograde into the posterior   lateral branch, however, there was no new focal stenosis.   RAO ventriculography. RAO ventriculography showed anterior apical and   mid inferior wall hypokinesis. EF was in the 40% range.   Right heart catheterization showed mean pulmonary capillary wedge   pressure of 24, PA pressure was 55/26, RV pressure was 52/5, RA pressure   mean was 16, aortic pressure was 155/76, LV pressure was equal to 167/16   with a post A-wave EDP of 26.   Cardiac output was 6.3 liters per minute with a cardiac index of 3   liters per minute per meter squared by Fick.    IMPRESSION: The patient's coronary artery disease is stable. The   stents in the RCA graft were patent. There are no new lesions that I   can see. She does have collateralized distal right and OM branch.   Continued medical therapy is warranted. If she continues to have chest   pain, Ranexa may be in order since it is somewhat late in the day and   she had both venous and arterial sheath. She will be kept overnight and   discharged in the morning. She tolerated the procedure well.     03/2014 Echo   Study Conclusions  - Left ventricle: The cavity size was normal. Wall thickness was increased in a pattern of mild LVH. Systolic function was normal. The estimated ejection fraction was in the range of 50% to 55%. There is akinesis of the basalinferolateral myocardium. Features are consistent with a pseudonormal left ventricular filling pattern, with concomitant  abnormal relaxation and increased filling pressure (grade 2 diastolic dysfunction). - Aortic valve: Mildly calcified annulus. Trileaflet. There was no significant regurgitation. - Mitral valve: There was trivial regurgitation. - Left atrium: The atrium was mildly to moderately dilated. - Right ventricle: Systolic function was mildly reduced. - Right atrium: Central venous pressure (est): 3 mm Hg. - Atrial septum: No defect or patent foramen ovale was identified. - Tricuspid valve: There was trivial regurgitation. - Pulmonary arteries: Systolic pressure could not be accurately estimated. - Pericardium, extracardiac: There was no pericardial effusion.  Impressions:  - Mild LVH with LVEF 50-55%, basal inferolateral hypokinesis, grade 2 diastolic dysfunction. Mild to moderate left atrial enlargement. Mildly reduced RV contraction. Unable to assess PASP.  03/2014 Carotid US   IMPRESSION:   Bilateral 50-69% stenosis. The left internal carotid peak systolic   velocity has increased slightly in the interval from the prior exam.   05/2014 Cath FINDINGS:   Hemodynamics:   Central Aortic Pressure / Mean: 110/62/81 mmHg Left Ventricular Pressure / LVEDP: 110/14/21 mmHg   Left Ventriculography: EF: 50 at 55 % Wall Motion: Severe hypokinesis of the Basal to MID inferior-inferolateraL wall with mild hypokinesis of the basal anterior wall   Coronary Anatomy: Dominance: Right Left Main: Normal caliber vessel that itself is free of significant disease. Bifurcates into the LAD and Circumflex LAD: Moderate caliber vessel that is 100% occluded after a septal perforator and small diagonal branch. There is a first diagonal  branch that is also half percent occluded after initial subtotal occlusion.   Left Circumflex: Moderate caliber vessel it gives rise to a proximal small caliber OM branch that is somewhat tortuous and free of significant disease. The vessel then bifurcates into the introducer  complex which is small in caliber and perfuses the basal inferolateral wall. There is a major lateral OM branch the visualized the proximal segment is occluded. There is late retrograde filling of the remainder the nature OM which appears to have 2 branches distally. This is faint collaterals from the proximal OM.    RCA: 100% proximal occluded. SVG-RPDA: Large-caliber graft with mildly irregular is proximally, there are overlapping stents in the distal mid graft with 99% napkin ring proximal in-stent restenosis of the most proximal stent that extends just minimally to the unstented segment. The remainder of the graft is relatively free of disease and attached to the mid RPDA. RPDA: Large-caliber tortuous vessel that reaches almost to the apex. RPL Sysytem:The RPAV fills via retrograde flow in the RPDA. There are several small posterolateral branches that are relatively free of disease. The PAV is relatively free of disease.   After reviewing the initial angiography, the culprit lesion was thought to be the 99% proximal stent ISR in the SVG-RPDA.  Preparation were made to proceed with PCI on this lesion.  As the lesion is focal & appears to be fibrous in nature and too stenosed to safely pass a filter wire, the decision was made to pre-dilate & stent without distal protection.  No evidence of a filling defect was noted besides the "napkin-ring" lesion.   Percutaneous Coronary Intervention:  Sheath exchanged for 6 Fr Guide: 6 Fr   AL1        Guidewire: BMW (after failed attempt with Pro-Water Predilation Balloon: Angioscult Cutting Balloon 3.5 mm x 10 mm;   12 Atm x 45 Sec,-- excellent predilation of the fibrous lesion at high pressure. Brisk TIMI-3 flow down stream but no sign of no reflow. The decision was made to proceed with stenting. Stent: Promus Premier DES 3.5 mm x 12 mm; overlaps roughly 4 mm into the original stent. Deployed at: 20 Atm x 30 Sec Post-dilation Balloon: [place] Emerge 4.0 mm x 8 mm;    14 Atm x 30 Sec x 2 -   Final Diameter: 4.1 mm   Post deployment angiography in multiple views, with and without guidewire in place revealed excellent stent deployment and lesion coverage.  There was no evidence of dissection or perforation. Brisk TIMI-3 flow is noted in the downstream vessel   MEDICATIONS: Anesthesia:  Local Lidocaine 2 ml Sedation:  4 mg IV Versed, 100 mcg IV fentanyl ;   Omnipaque Contrast: 180 ml Anticoagulation:  IV Heparin 13500 Units total   Anti-Platelet Agent:  Effient and aspirin as standing home medication   PATIENT DISPOSITION:   The patient was transferred to the PACU holding area in a hemodynamicaly stable, chest pain free condition. The patient tolerated the procedure well, and there were no complications.  EBL:   < 5 ml The patient was stable before, during, and after the procedure.   POST-OPERATIVE DIAGNOSIS:   Severe native CAD with 100% negative RCA, LAD & D1 along with OM 1 and OM 2 as previously described Severe proximal edge in-stent restenosis of SVG-RPDA   Successful PCI of the proximal edge in-stent restenosis of SVG RCA with scoring balloon angioplasty followed by DES stent placement: Promus Premier 3.5 mm x 12 mm postdilated to  4.1 mm Widely patent LIMA-diagonal with distal collaterals to what appears to be the apical LAD. Known occlusion of the SVG-LAD and SVG-OM Relatively preserved LVEF as noted on the cardiogram recently. There is known hypokinesis to akinesis of the nasal to mid inferior inferolateral wall as well as mild hypokinesis of the basal anterior wall assistant with known CAD.   PLAN OF CARE: Overnight observation post-PCI. Continue home doses of aspirin plus Effient. Standard post radial cath care with care being removal   Discharge in the morning if stable. Followup with Dr. Harl Bowie - continue to optimize medical management of existing severe CAD.      05/2015 Cath                                                              Dominance: Co-dominant                             Left Anterior Descending                Mid LAD lesion, 100% stenosed.                First Diagonal Branch           The vessel is small in size.                1st Diag lesion, 99% stenosed.                          Ramus Intermedius      The vessel is small .                               Left Circumflex      The vessel is small .                Mid Cx to Dist Cx lesion, 100% stenosed.                Second Obtuse Marginal Branch           The vessel is small in size.                Third Obtuse Marginal Branch           3rd Mrg filled by collaterals from 1st Mrg.                               Right Coronary Artery                Prox RCA to Mid RCA lesion, 100% stenosed.                Dist RCA lesion, 100% stenosed.                               Graft Angiography                      Free Graft to Dist LAD      SVG  Prox Graft to Mid Graft lesion, 100% stenosed.                         Free Graft to Dist RCA      SVG                Mid Graft lesion, 30% stenosed. The lesion was previously treated with a stent (unknown type) .                         Free Graft to 2nd Mrg      SVG                Origin to Prox Graft lesion, 100% stenosed.                  Free LIMA Graft to 2nd Diag      LIMA                      Cath RECOMMENDATIONS:    Continued aggressive risk factor modification No anatomy amenable to percutaneous intervention.          09/2017 carotid US   Final Interpretation: Right Carotid: There is evidence in the right ICA of a 40-59% stenosis. Stable                40-59% RICA stenosis.  Left Carotid: There is evidence in the left ICA of a 40-59% stenosis. Stable               65-99% LICA stenosis.  Vertebrals:  Both vertebral arteries were patent with antegrade flow. Subclavians: Normal flow hemodynamics were seen in bilateral subclavian              arteries.      06/2018 nuclear stress   Moderate inferior (base, mid, distal), inferolateral (base, mid) defect consistent with scar and possible soft tissue attenuation No significant ischemia LVEF 33% High risk scan due to scar and depressed LVEF   06/2018 echo Study Conclusions   - Left ventricle: The cavity size was mildly dilated. Wall   thickness was normal. Systolic function was normal. The estimated   ejection fraction was in the range of 55% to 60%. - Mitral valve: There was mild regurgitation. - Left atrium: The atrium was mildly dilated. - Pulmonary arteries: PA peak pressure: 32 mm Hg (S).     08/2020 nuclear stress [facility] 1. Exam positive for moderate size area of mild reversibility  involving the anterolateral wall. Fixed defect involving the  inferolateral wall and inferior wall compatible with scar.   2. Diffusely abnormal left ventricular wall motion with inferior  septal and inferior wall hypokinesis and inferolateral wall  hypokinesis and apical akinesis..   3. Left ventricular ejection fraction 31%   4. Non invasive risk stratification*: High     08/27/2020 carotid artery duplex Summary:  Right Carotid: Velocities in the right ICA are consistent with a 40-59%                 stenosis.   Left Carotid: Velocities in the left ICA are consistent with a 60-79%  stenosis.   Vertebrals:  Bilateral vertebral arteries demonstrate antegrade flow.  Subclavians: Normal flow hemodynamics were seen in bilateral subclavian               arteries.       08/2020 echo Summary    1. The left  ventricle is normal in size with upper normal wall thickness.    2. The left ventricular systolic function is overall normal, LVEF is  visually estimated at 55%.    3. There is grade II diastolic dysfunction (elevated filling pressure).    4. The left atrium is moderately dilated in size.    5. The right ventricle is normal in size, with normal systolic function.    6. The right atrium is  mildly dilated  in size.      07/2021 echo  1. Left ventricular ejection fraction, by estimation, is 40 to 45%. The  left ventricle has mildly decreased function. The left ventricle  demonstrates regional wall motion abnormalities (see scoring  diagram/findings for description). There is mild left  ventricular hypertrophy. Left ventricular diastolic parameters are  indeterminate.   2. Right ventricular systolic function is normal. The right ventricular  size is normal. There is normal pulmonary artery systolic pressure. The  estimated right ventricular systolic pressure is 77.9 mmHg.   3. The mitral valve is grossly normal. Trivial mitral valve  regurgitation.   4. The aortic valve is tricuspid. Aortic valve regurgitation is not  visualized. Mild aortic valve sclerosis is present, with no evidence of  aortic valve stenosis.   5. The inferior vena cava is normal in size with greater than 50%  respiratory variability, suggesting right atrial pressure of 3 mmHg.   Assessment and Plan:  1. CAD in native artery   2. Ischemic cardiomyopathy   3. Bilateral carotid artery stenosis   4. Essential hypertension   5. Atrial fibrillation, new onset ([place])    1. CAD in native artery ***  2. Ischemic cardiomyopathy ***  3. Bilateral carotid artery stenosis ***  4. Essential hypertension ***  5. Atrial fibrillation, new onset (HCC) ***   Medication Adjustments/Labs and Tests Ordered: Current medicines are reviewed at length with the patient today.  Concerns regarding medicines are outlined above.   Disposition: Follow-up with ***  Signed, Levell July, NP 08/11/2021 10:00 PM    [place] [facility] [place], [place], [place] [address] Phone: [phone]; Fax: [phone]

## 2021-08-12 ENCOUNTER — Ambulatory Visit: Payer: Medicare Other | Admitting: Family Medicine

## 2021-08-12 ENCOUNTER — Inpatient Hospital Stay (HOSPITAL_COMMUNITY)
Admission: EM | Admit: 2021-08-12 | Discharge: 2021-08-15 | DRG: 871 | Disposition: A | Discharge status: Home / Self Care | Payer: Medicare Other | Attending: Internal Medicine | Admitting: Internal Medicine

## 2021-08-12 ENCOUNTER — Other Ambulatory Visit: Payer: Self-pay

## 2021-08-12 ENCOUNTER — Emergency Department (HOSPITAL_COMMUNITY): Payer: Medicare Other

## 2021-08-12 DIAGNOSIS — Z91199 Patient's noncompliance with other medical treatment and regimen due to unspecified reason: Secondary | ICD-10-CM

## 2021-08-12 DIAGNOSIS — I4819 Other persistent atrial fibrillation: Secondary | ICD-10-CM | POA: Diagnosis present

## 2021-08-12 DIAGNOSIS — R579 Shock, unspecified: Secondary | ICD-10-CM | POA: Diagnosis present

## 2021-08-12 DIAGNOSIS — I5023 Acute on chronic systolic (congestive) heart failure: Secondary | ICD-10-CM | POA: Diagnosis present

## 2021-08-12 DIAGNOSIS — D649 Anemia, unspecified: Secondary | ICD-10-CM | POA: Diagnosis present

## 2021-08-12 DIAGNOSIS — E039 Hypothyroidism, unspecified: Secondary | ICD-10-CM | POA: Diagnosis present

## 2021-08-12 DIAGNOSIS — I4891 Unspecified atrial fibrillation: Secondary | ICD-10-CM

## 2021-08-12 DIAGNOSIS — R079 Chest pain, unspecified: Secondary | ICD-10-CM

## 2021-08-12 DIAGNOSIS — R55 Syncope and collapse: Secondary | ICD-10-CM | POA: Diagnosis present

## 2021-08-12 DIAGNOSIS — I2510 Atherosclerotic heart disease of native coronary artery without angina pectoris: Secondary | ICD-10-CM | POA: Diagnosis present

## 2021-08-12 DIAGNOSIS — Z90710 Acquired absence of both cervix and uterus: Secondary | ICD-10-CM

## 2021-08-12 DIAGNOSIS — Z888 Allergy status to other drugs, medicaments and biological substances status: Secondary | ICD-10-CM

## 2021-08-12 DIAGNOSIS — E861 Hypovolemia: Secondary | ICD-10-CM | POA: Diagnosis present

## 2021-08-12 DIAGNOSIS — M1990 Unspecified osteoarthritis, unspecified site: Secondary | ICD-10-CM | POA: Diagnosis present

## 2021-08-12 DIAGNOSIS — Z8249 Family history of ischemic heart disease and other diseases of the circulatory system: Secondary | ICD-10-CM

## 2021-08-12 DIAGNOSIS — I255 Ischemic cardiomyopathy: Secondary | ICD-10-CM

## 2021-08-12 DIAGNOSIS — I959 Hypotension, unspecified: Secondary | ICD-10-CM | POA: Diagnosis not present

## 2021-08-12 DIAGNOSIS — K219 Gastro-esophageal reflux disease without esophagitis: Secondary | ICD-10-CM | POA: Diagnosis present

## 2021-08-12 DIAGNOSIS — I493 Ventricular premature depolarization: Secondary | ICD-10-CM | POA: Diagnosis present

## 2021-08-12 DIAGNOSIS — I252 Old myocardial infarction: Secondary | ICD-10-CM

## 2021-08-12 DIAGNOSIS — I498 Other specified cardiac arrhythmias: Secondary | ICD-10-CM | POA: Diagnosis present

## 2021-08-12 DIAGNOSIS — Z79899 Other long term (current) drug therapy: Secondary | ICD-10-CM

## 2021-08-12 DIAGNOSIS — R0789 Other chest pain: Secondary | ICD-10-CM | POA: Diagnosis present

## 2021-08-12 DIAGNOSIS — Z79890 Hormone replacement therapy (postmenopausal): Secondary | ICD-10-CM

## 2021-08-12 DIAGNOSIS — I5022 Chronic systolic (congestive) heart failure: Secondary | ICD-10-CM | POA: Diagnosis not present

## 2021-08-12 DIAGNOSIS — I5021 Acute systolic (congestive) heart failure: Secondary | ICD-10-CM | POA: Diagnosis not present

## 2021-08-12 DIAGNOSIS — F4310 Post-traumatic stress disorder, unspecified: Secondary | ICD-10-CM | POA: Diagnosis present

## 2021-08-12 DIAGNOSIS — G8929 Other chronic pain: Secondary | ICD-10-CM | POA: Diagnosis present

## 2021-08-12 DIAGNOSIS — Z20822 Contact with and (suspected) exposure to covid-19: Secondary | ICD-10-CM | POA: Diagnosis present

## 2021-08-12 DIAGNOSIS — J449 Chronic obstructive pulmonary disease, unspecified: Secondary | ICD-10-CM | POA: Diagnosis present

## 2021-08-12 DIAGNOSIS — Z9861 Coronary angioplasty status: Secondary | ICD-10-CM | POA: Diagnosis not present

## 2021-08-12 DIAGNOSIS — Z87891 Personal history of nicotine dependence: Secondary | ICD-10-CM

## 2021-08-12 DIAGNOSIS — E785 Hyperlipidemia, unspecified: Secondary | ICD-10-CM | POA: Diagnosis present

## 2021-08-12 DIAGNOSIS — F32A Depression, unspecified: Secondary | ICD-10-CM | POA: Diagnosis present

## 2021-08-12 DIAGNOSIS — G4733 Obstructive sleep apnea (adult) (pediatric): Secondary | ICD-10-CM | POA: Diagnosis present

## 2021-08-12 DIAGNOSIS — Z7901 Long term (current) use of anticoagulants: Secondary | ICD-10-CM | POA: Diagnosis not present

## 2021-08-12 DIAGNOSIS — Z7982 Long term (current) use of aspirin: Secondary | ICD-10-CM

## 2021-08-12 DIAGNOSIS — R571 Hypovolemic shock: Principal | ICD-10-CM | POA: Diagnosis present

## 2021-08-12 DIAGNOSIS — I10 Essential (primary) hypertension: Secondary | ICD-10-CM

## 2021-08-12 DIAGNOSIS — I110 Hypertensive heart disease with heart failure: Secondary | ICD-10-CM | POA: Diagnosis present

## 2021-08-12 DIAGNOSIS — I6523 Occlusion and stenosis of bilateral carotid arteries: Secondary | ICD-10-CM

## 2021-08-12 DIAGNOSIS — I4729 Other ventricular tachycardia: Secondary | ICD-10-CM

## 2021-08-12 DIAGNOSIS — Z885 Allergy status to narcotic agent status: Secondary | ICD-10-CM

## 2021-08-12 DIAGNOSIS — Z887 Allergy status to serum and vaccine status: Secondary | ICD-10-CM

## 2021-08-12 DIAGNOSIS — Z9049 Acquired absence of other specified parts of digestive tract: Secondary | ICD-10-CM | POA: Diagnosis not present

## 2021-08-12 DIAGNOSIS — Z955 Presence of coronary angioplasty implant and graft: Secondary | ICD-10-CM

## 2021-08-12 DIAGNOSIS — Z951 Presence of aortocoronary bypass graft: Secondary | ICD-10-CM

## 2021-08-12 LAB — BRAIN NATRIURETIC PEPTIDE: B Natriuretic Peptide: 164.2 pg/mL — ABNORMAL HIGH (ref 0.0–100.0)

## 2021-08-12 LAB — URINALYSIS, ROUTINE W REFLEX MICROSCOPIC
Bilirubin Urine: NEGATIVE
Glucose, UA: NEGATIVE mg/dL
Hgb urine dipstick: NEGATIVE
Ketones, ur: 5 mg/dL — AB
Leukocytes,Ua: NEGATIVE
Nitrite: NEGATIVE
Protein, ur: NEGATIVE mg/dL
Specific Gravity, Urine: 1.004 — ABNORMAL LOW (ref 1.005–1.030)
pH: 5 (ref 5.0–8.0)

## 2021-08-12 LAB — T4, FREE: Free T4: 1.06 ng/dL (ref 0.61–1.12)

## 2021-08-12 LAB — CBC WITH DIFFERENTIAL/PLATELET
Abs Immature Granulocytes: 0.01 10*3/uL (ref 0.00–0.07)
Basophils Absolute: 0 10*3/uL (ref 0.0–0.1)
Basophils Relative: 1 %
Eosinophils Absolute: 0 10*3/uL (ref 0.0–0.5)
Eosinophils Relative: 1 %
HCT: 29.5 % — ABNORMAL LOW (ref 36.0–46.0)
Hemoglobin: 10.1 g/dL — ABNORMAL LOW (ref 12.0–15.0)
Immature Granulocytes: 0 %
Lymphocytes Relative: 27 %
Lymphs Abs: 1.4 10*3/uL (ref 0.7–4.0)
MCH: 30.7 pg (ref 26.0–34.0)
MCHC: 34.2 g/dL (ref 30.0–36.0)
MCV: 89.7 fL (ref 80.0–100.0)
Monocytes Absolute: 0.6 10*3/uL (ref 0.1–1.0)
Monocytes Relative: 10 %
Neutro Abs: 3.3 10*3/uL (ref 1.7–7.7)
Neutrophils Relative %: 61 %
Platelets: 314 10*3/uL (ref 150–400)
RBC: 3.29 MIL/uL — ABNORMAL LOW (ref 3.87–5.11)
RDW: 12.8 % (ref 11.5–15.5)
WBC: 5.4 10*3/uL (ref 4.0–10.5)
nRBC: 0 % (ref 0.0–0.2)

## 2021-08-12 LAB — APTT: aPTT: 33 seconds (ref 24–36)

## 2021-08-12 LAB — RESP PANEL BY RT-PCR (FLU A&B, COVID) ARPGX2
Influenza A by PCR: NEGATIVE
Influenza B by PCR: NEGATIVE
SARS Coronavirus 2 by RT PCR: NEGATIVE

## 2021-08-12 LAB — BASIC METABOLIC PANEL
Anion gap: 11 (ref 5–15)
BUN: 23 mg/dL (ref 8–23)
CO2: 20 mmol/L — ABNORMAL LOW (ref 22–32)
Calcium: 8.7 mg/dL — ABNORMAL LOW (ref 8.9–10.3)
Chloride: 102 mmol/L (ref 98–111)
Creatinine, Ser: 1.23 mg/dL — ABNORMAL HIGH (ref 0.44–1.00)
GFR, Estimated: 49 mL/min — ABNORMAL LOW (ref >60)
Glucose, Bld: 101 mg/dL — ABNORMAL HIGH (ref 70–99)
Potassium: 3.9 mmol/L (ref 3.5–5.1)
Sodium: 133 mmol/L — ABNORMAL LOW (ref 135–145)

## 2021-08-12 LAB — TROPONIN I (HIGH SENSITIVITY)
Troponin I (High Sensitivity): 18 ng/L — ABNORMAL HIGH (ref <18)
Troponin I (High Sensitivity): 17 ng/L (ref <18)

## 2021-08-12 LAB — TSH: TSH: 2.344 u[IU]/mL (ref 0.350–4.500)

## 2021-08-12 LAB — HEPARIN LEVEL (UNFRACTIONATED): Heparin Unfractionated: >1.1 IU/mL — ABNORMAL HIGH (ref 0.30–0.70)

## 2021-08-12 LAB — HEMOGLOBIN AND HEMATOCRIT, BLOOD
HCT: 29.3 % — ABNORMAL LOW (ref 36.0–46.0)
Hemoglobin: 10.1 g/dL — ABNORMAL LOW (ref 12.0–15.0)

## 2021-08-12 LAB — LACTIC ACID, PLASMA: Lactic Acid, Venous: 0.9 mmol/L (ref 0.5–1.9)

## 2021-08-12 LAB — MAGNESIUM: Magnesium: 1.6 mg/dL — ABNORMAL LOW (ref 1.7–2.4)

## 2021-08-12 LAB — GLUCOSE, CAPILLARY: Glucose-Capillary: 130 mg/dL — ABNORMAL HIGH (ref 70–99)

## 2021-08-12 MED ORDER — ALPRAZOLAM 0.25 MG PO TABS
0.2500 mg | ORAL_TABLET | Freq: Two times a day (BID) | ORAL | Status: DC | PRN
  Administered 2021-08-13 – 2021-08-14 (×4): 0.25 mg via ORAL
  Filled 2021-08-12 (×4): qty 1

## 2021-08-12 MED ORDER — LORAZEPAM 2 MG/ML IJ SOLN: 1.0000 mg | Freq: Once | INTRAMUSCULAR | Status: AC

## 2021-08-12 MED ORDER — EZETIMIBE 10 MG PO TABS
10.0000 mg | ORAL_TABLET | Freq: Every day | ORAL | Status: DC
  Administered 2021-08-13 – 2021-08-15 (×3): 10 mg via ORAL
  Filled 2021-08-12 (×3): qty 1

## 2021-08-12 MED ORDER — LORAZEPAM 2 MG/ML IJ SOLN
INTRAMUSCULAR | Status: AC
  Administered 2021-08-12: 1 mg via INTRAVENOUS
  Filled 2021-08-12: qty 1

## 2021-08-12 MED ORDER — MAGNESIUM SULFATE 2 GM/50ML IV SOLN
2.0000 g | Freq: Once | INTRAVENOUS | Status: AC
  Administered 2021-08-12: 2 g via INTRAVENOUS
  Filled 2021-08-12: qty 50

## 2021-08-12 MED ORDER — NITROGLYCERIN 0.4 MG SL SUBL: 0.4000 mg | SUBLINGUAL_TABLET | SUBLINGUAL | Status: DC | PRN

## 2021-08-12 MED ORDER — SODIUM CHLORIDE 0.9 % IV SOLN: INTRAVENOUS | Status: DC

## 2021-08-12 MED ORDER — SODIUM CHLORIDE 0.9 % IV BOLUS
1000.0000 mL | Freq: Once | INTRAVENOUS | Status: AC
  Administered 2021-08-12: 1000 mL via INTRAVENOUS

## 2021-08-12 MED ORDER — ROSUVASTATIN CALCIUM 5 MG PO TABS
5.0000 mg | ORAL_TABLET | Freq: Every day | ORAL | Status: DC
  Administered 2021-08-12 – 2021-08-15 (×4): 5 mg via ORAL
  Filled 2021-08-12 (×5): qty 1

## 2021-08-12 MED ORDER — NOREPINEPHRINE 4 MG/250ML-% IV SOLN
0.0000 ug/min | INTRAVENOUS | Status: DC
  Administered 2021-08-12: 2 ug/min via INTRAVENOUS
  Administered 2021-08-13: 10 ug/min via INTRAVENOUS
  Filled 2021-08-12 (×2): qty 250

## 2021-08-12 MED ORDER — ZOLPIDEM TARTRATE 5 MG PO TABS: 5.0000 mg | ORAL_TABLET | Freq: Every evening | ORAL | Status: DC | PRN

## 2021-08-12 MED ORDER — APIXABAN 5 MG PO TABS
5.0000 mg | ORAL_TABLET | Freq: Two times a day (BID) | ORAL | Status: DC
  Administered 2021-08-12: 5 mg via ORAL
  Filled 2021-08-12 (×2): qty 1

## 2021-08-12 MED ORDER — ASPIRIN 300 MG RE SUPP: 300.0000 mg | RECTAL | Status: DC

## 2021-08-12 MED ORDER — ACETAMINOPHEN 325 MG PO TABS
650.0000 mg | ORAL_TABLET | ORAL | Status: DC | PRN
  Administered 2021-08-13 – 2021-08-15 (×3): 650 mg via ORAL
  Filled 2021-08-12 (×3): qty 2

## 2021-08-12 MED ORDER — ONDANSETRON HCL 4 MG/2ML IJ SOLN: 4.0000 mg | Freq: Four times a day (QID) | INTRAMUSCULAR | Status: DC | PRN

## 2021-08-12 MED ORDER — ALBUTEROL SULFATE (2.5 MG/3ML) 0.083% IN NEBU: 3.0000 mL | INHALATION_SOLUTION | Freq: Four times a day (QID) | RESPIRATORY_TRACT | Status: DC | PRN

## 2021-08-12 MED ORDER — PANTOPRAZOLE SODIUM 40 MG PO TBEC
40.0000 mg | DELAYED_RELEASE_TABLET | Freq: Every day | ORAL | Status: DC
  Administered 2021-08-13 – 2021-08-15 (×3): 40 mg via ORAL
  Filled 2021-08-12 (×3): qty 1

## 2021-08-12 MED ORDER — HEPARIN (PORCINE) 25000 UT/250ML-% IV SOLN
1000.0000 [IU]/h | INTRAVENOUS | Status: DC
  Administered 2021-08-12: 1000 [IU]/h via INTRAVENOUS
  Filled 2021-08-12: qty 250

## 2021-08-12 MED ORDER — CHLORHEXIDINE GLUCONATE CLOTH 2 % EX PADS
6.0000 | MEDICATED_PAD | Freq: Every day | CUTANEOUS | Status: DC
  Administered 2021-08-12 – 2021-08-13 (×2): 6 via TOPICAL

## 2021-08-12 MED ORDER — ASPIRIN 81 MG PO CHEW
324.0000 mg | CHEWABLE_TABLET | ORAL | Status: DC
  Filled 2021-08-12: qty 4

## 2021-08-12 MED ORDER — LEVETIRACETAM 250 MG PO TABS
250.0000 mg | ORAL_TABLET | Freq: Two times a day (BID) | ORAL | Status: DC
  Administered 2021-08-12 – 2021-08-15 (×6): 250 mg via ORAL
  Filled 2021-08-12 (×8): qty 1

## 2021-08-12 MED ORDER — LORATADINE 10 MG PO TABS
10.0000 mg | ORAL_TABLET | Freq: Every day | ORAL | Status: DC
  Administered 2021-08-13 – 2021-08-15 (×3): 10 mg via ORAL
  Filled 2021-08-12 (×3): qty 1

## 2021-08-12 MED ORDER — LEVOTHYROXINE SODIUM 75 MCG PO TABS
75.0000 ug | ORAL_TABLET | Freq: Every day | ORAL | Status: DC
  Administered 2021-08-14 – 2021-08-15 (×2): 75 ug via ORAL
  Filled 2021-08-12 (×2): qty 1

## 2021-08-12 NOTE — ED Notes (Signed)
Pt intermittently feeling clammy, lightheaded, nauseated, often preceded by rhythm going into bigeminy from a sinus rhythm. BP seen as low as 73/49 automatic; manual BP 90/42. ED PA says okay for 1L NS bolus at this time. Will consult cardiology.

## 2021-08-12 NOTE — ED Triage Notes (Signed)
Pt BIB [facility] for chest pain, nausea and vomiting. Recent cardioversion for a fib @ [facility] per medic (within two weeks). Reports nausea today with chest pain. Pt was initially in bigeminy, now irregular PVCs noted.

## 2021-08-12 NOTE — ED Provider Notes (Signed)
[facility] EMERGENCY DEPARTMENT Provider Note   CSN: [id number] Arrival date & time: 08/12/21  1724     History Chief Complaint  Patient presents with   Chest Pain    [name] is a 65 y.o. female.  The history is provided by the patient and medical records.  Chest Pain Associated symptoms: palpitations    64 year old female with history of anemia, anxiety, COPD, coronary artery disease status post CABG, GERD, hypertension, hyperlipidemia, hypothyroidism, ischemic cardiomyopathy, presenting to the ED with chest pain and palpitations.  Patient reports she was seen at [facility] 07/30/2021 and underwent electrical cardioversion in the ED for A. fib.  States she felt normal for a few days afterwards but then began feeling poorly again.  States intermittently she has been having episodes of palpitations, lightheadedness, and near syncope.  She has not lost consciousness.  States this has been occurring more frequently and makes her feel unsafe as she does live at home.  Also reports she has lost a significant amount of weight over the past month after starting Entresto.  It appears she called her doctor's office about this and recommended to hold off on the Brooks further.  She is continue taking her Eliquis and has not had any issues with this.  Does have some intermittent shortness of breath, mostly with her episodes of palpitations.  These occur independent of activity, often seem random in onset without any noted alleviating or exacerbating factors.  She is followed by Dr. Wyline Mood.  Past Medical History:  Diagnosis Date   Acute renal failure (HCC)    Anemia    Anxiety    Arthritis    Asthma    Carotid artery disease (HCC)    a. Duplex 50-69% BICA 03/2014.   Chronic back pain    Collagen vascular disease (HCC)    COPD (chronic obstructive pulmonary disease) (HCC)    Coronary artery disease    a. s/p prior CABG 2005. b. BMS 02/2009 to VG-PDA. c.  NSTEMI 08/2010 s/p PTCA/DES to SVG-PDA in previously stented area. d. DES to SVG to RCA on 05/24/14  c. LHC 06/04/2015 w/ no sig change in her anatomy and Rx managment   DDD (degenerative disc disease)    Depression    Diverticulitis    4 documented episodes by CT; most recent April 2012   DIVERTICULITIS OF COLON 07/14/2010   Qualifier: Diagnosis of  By: De Blanch.    GERD (gastroesophageal reflux disease)    History of blood transfusion    HTN (hypertension)    Hyperlipidemia    Hypertension    Hypothyroidism    Ischemic cardiomyopathy    a. EF 40% in 2012 by cath. b. improved to 50-55% by cath 05/2014  c. EF 35-40% by [place] 05/2015   Kidney stones    Seizures (HCC)    Sinus bradycardia    Sleep apnea    a. Mild-to-moderate obstructive sleep apnea diagnosed in April 2012 by sleep study.   Tension headache     Patient Active Problem List   Diagnosis Date Noted   Encounter to establish care 09/12/2020   Anxiety 09/12/2020   Insomnia 09/12/2020   Hypothyroidism 07/03/2020   Ischemic cardiomyopathy 07/03/2020   Erroneous encounter - disregard 09/17/2015   CAD S/P multiple PCI after CABG 06/04/2015   Obesity-BMI 41 06/04/2015   Unstable angina (HCC) 06/03/2015   Angina, class III - progressed despite medical therapy 10/01/2013   LLQ abdominal pain  12/02/2011   Epigastric pain 04/19/2011   Anemia 04/19/2011   Diverticulitis 01/15/2011   DIARRHEA, CHRONIC 07/14/2010   Hx of CABG 2005 01/23/2010   Headache 01/23/2010   Hyperlipidemia with target LDL less than 70 05/27/2009   Essential hypertension 05/27/2009   PVD- 50-69% bilat ICA  05/27/2009   Chronic obstructive pulmonary disease (COPD) (HCC) 05/27/2009   GERD 05/27/2009   Seizure (HCC) 05/27/2009    Past Surgical History:  Procedure Laterality Date   APPENDECTOMY  ~ 1970   CARDIAC CATHETERIZATION  Jan 2012   patent stents in RCA graft; CAD stable   CARDIAC CATHETERIZATION N/A 06/03/2015   Procedure: Left Heart  Cath and Cors/Grafts Angiography;  Surgeon: Lyn Records, MD;  Location: [facility] INVASIVE CV LAB;  Service: Cardiovascular;  Laterality: N/A;   CARPAL TUNNEL WITH CUBITAL TUNNEL Right 2001   CORONARY ANGIOPLASTY WITH STENT PLACEMENT  2011   Promus Premier DES, 3.5 mm x 12 mm, for 99% proximal stent ISR in the SVG-RPDA    CORONARY ANGIOPLASTY WITH STENT PLACEMENT  05/24/2014   CORONARY ARTERY BYPASS GRAFT  2005   LIMA to AL, SVG to LAD, SVG to Cx, SVG-PDA   KNEE ARTHROSCOPY W/ ACL RECONSTRUCTION Left 2001   LAPAROSCOPIC CHOLECYSTECTOMY  2010   LEFT HEART CATHETERIZATION WITH CORONARY/GRAFT ANGIOGRAM N/A 05/24/2014   Procedure: LEFT HEART CATHETERIZATION WITH CORONARY/GRAFT ANGIOGRAM;  Surgeon: Marykay Lex, MD; EF 50-55%, LAD & D1 100%, LIMA-D1 OK, CFX OK, OM1 & OM2 100%, lat OM branch 100%, RCA 100%; pSVG-RPDA 99% rx w/ scoring balloon PTCA & 3.5 x 12 mm Promus DES; SVG-LAD, SVG-OM known occluded       TUBAL LIGATION  ~ 1989   VAGINAL HYSTERECTOMY  ~ 1993     OB History   No obstetric history on file.     Family History  Problem Relation Age of Onset   Heart attack Mother    Heart disease Mother    Breast cancer Mother    Heart attack Father    Heart disease Father    Colon cancer Neg Hx     Social History   Tobacco Use   Smoking status: Former    Packs/day: 0.50    Years: 21.00    Pack years: 10.50    Types: Cigarettes    Start date: 03/19/1993    Quit date: 07/29/2015    Years since quitting: 6.0   Smokeless tobacco: Never   Tobacco comments:    smokes some time/go weeks without smoking  Vaping Use   Vaping Use: Never used  Substance Use Topics   Alcohol use: No    Alcohol/week: 0.0 standard drinks   Drug use: Not Currently    Types: Cocaine, Marijuana    Comment: last month    Home Medications Prior to Admission medications   Medication Sig Start Date End Date Taking? Authorizing Provider  acetaminophen (TYLENOL) 650 MG CR tablet Take 1,300 mg by mouth every 8  (eight) hours as needed for pain.    [provider]  albuterol (VENTOLIN HFA) 108 (90 Base) MCG/ACT inhaler Inhale 2 puffs into the lungs every 6 (six) hours as needed for wheezing or shortness of breath.    [provider]  amLODipine (NORVASC) 10 MG tablet TAKE ONE TABLET BY MOUTH ONCE DAILY. 02/28/21   Antoine Poche, MD  apixaban (ELIQUIS) 5 MG TABS tablet Take 1 tablet (5 mg total) by mouth 2 (two) times daily. 06/16/21   Rennis Harding  Kaylyn Lim., NP  benazepril (LOTENSIN) 10 MG tablet Take 10 mg by mouth daily.    [provider]  cetirizine (ZYRTEC ALLERGY) 10 MG tablet Take 1 tablet (10 mg total) by mouth daily. 06/05/20   Avegno, Darrelyn Hillock, FNP  ezetimibe (ZETIA) 10 MG tablet TAKE (1) TABLET BY MOUTH ONCE DAILY. Patient taking differently: Take 10 mg by mouth daily. 02/28/21   Arnoldo Lenis, MD  furosemide (LASIX) 80 MG tablet TAKE ONE TABLET DAILY. 06/17/21   Arnoldo Lenis, MD  hydrOXYzine (ATARAX/VISTARIL) 25 MG tablet Take 1 tablet (25 mg total) by mouth every 6 (six) hours as needed for anxiety. 07/30/21   Marcello Fennel, PA-C  isosorbide mononitrate (IMDUR) 60 MG 24 hr tablet Take 1 tablet (60 mg total) by mouth daily. 06/16/21   Verta Ellen., NP  levETIRAcetam (KEPPRA) 250 MG tablet TAKE (1) TABLET BY MOUTH TWICE DAILY. Patient taking differently: Take 250 mg by mouth 2 (two) times daily. 06/08/21   Lindell Spar, MD  levothyroxine (SYNTHROID) 75 MCG tablet Take 1 tablet (75 mcg total) by mouth daily before breakfast. 06/16/21   Verta Ellen., NP  metoprolol succinate (TOPROL-XL) 50 MG 24 hr tablet Take 1 tablet (50 mg total) by mouth daily. 07/25/20   Verta Ellen., NP  Multiple Vitamin (MULTIVITAMIN WITH MINERALS) TABS tablet Take 1 tablet by mouth 2 (two) times daily. Patient taking differently: Take 1 tablet by mouth daily. 08/28/13   Arnoldo Lenis, MD  nitroGLYCERIN (NITROSTAT) 0.4 MG SL tablet DISSOLVE 1 TABLET UNDER  TONGUE EVERY 5 MINUTES UP TO 15 MIN FOR CHESTPAIN. IF NO RELIEF CALL 911. 06/16/21   Verta Ellen., NP  Omega 3 1000 MG CAPS Take 1 capsule (1,000 mg total) by mouth 2 (two) times daily. 08/27/20   Verta Ellen., NP  omeprazole (PRILOSEC) 20 MG capsule TAKE (1) CAPSULE BY MOUTH ONCE DAILY. 08/06/21   Arnoldo Lenis, MD  potassium chloride SA (KLOR-CON) 20 MEQ tablet TAKE (1) TABLET BY MOUTH EACH MORNING. Patient taking differently: Take 20 mEq by mouth daily. 02/28/21   Arnoldo Lenis, MD  rosuvastatin (CRESTOR) 5 MG tablet Take 1 tablet (5 mg total) by mouth daily. 07/25/20   Verta Ellen., NP  sacubitril-valsartan (ENTRESTO) 24-26 MG Take 1 tablet by mouth 2 (two) times daily. (08/07/2021 - holding currently) 08/07/21   Arnoldo Lenis, MD    Allergies    Pneumococcal vaccines, Adhesive [tape], Crestor [rosuvastatin], Lipitor [atorvastatin], and Morphine and related  Review of Systems   Review of Systems  Cardiovascular:  Positive for chest pain and palpitations.  All other systems reviewed and are negative.  Physical Exam Updated Vital Signs BP 111/62 (BP Location: Right Arm)   Pulse 85   Temp 98.2 F (36.8 C) (Oral)   Resp 12   Ht 5\' 3"  (1.6 m)   Wt 86 kg   SpO2 99%   BMI 33.59 kg/m   Physical Exam Vitals and nursing note reviewed.  Constitutional:      Appearance: She is well-developed.  HENT:     Head: Normocephalic and atraumatic.  Eyes:     Conjunctiva/sclera: Conjunctivae normal.     Pupils: Pupils are equal, round, and reactive to light.  Cardiovascular:     Rate and Rhythm: Normal rate and regular rhythm.     Heart sounds: Normal heart sounds.     Comments: Intermittent bigeminy noted on monitor,  noted to be symptomatic with onset Pulmonary:     Effort: Pulmonary effort is normal.     Breath sounds: Normal breath sounds.  Abdominal:     General: Bowel sounds are normal.     Palpations: Abdomen is soft.  Musculoskeletal:         General: Normal range of motion.     Cervical back: Normal range of motion.  Skin:    General: Skin is warm and dry.  Neurological:     Mental Status: She is alert and oriented to person, place, and time.    ED Results / Procedures / Treatments   Labs (all labs ordered are listed, but only abnormal results are displayed) Labs Reviewed  CBC WITH DIFFERENTIAL/PLATELET - Abnormal; Notable for the following components:      Result Value   RBC 3.29 (*)    Hemoglobin 10.1 (*)    HCT 29.5 (*)    All other components within normal limits  BASIC METABOLIC PANEL - Abnormal; Notable for the following components:   Sodium 133 (*)    CO2 20 (*)    Glucose, Bld 101 (*)    Creatinine, Ser 1.23 (*)    Calcium 8.7 (*)    GFR, Estimated 49 (*)    All other components within normal limits  MAGNESIUM - Abnormal; Notable for the following components:   Magnesium 1.6 (*)    All other components within normal limits  BRAIN NATRIURETIC PEPTIDE - Abnormal; Notable for the following components:   B Natriuretic Peptide 164.2 (*)    All other components within normal limits  URINALYSIS, ROUTINE W REFLEX MICROSCOPIC - Abnormal; Notable for the following components:   Color, Urine COLORLESS (*)    Specific Gravity, Urine 1.004 (*)    Ketones, ur 5 (*)    All other components within normal limits  HEPARIN LEVEL (UNFRACTIONATED) - Abnormal; Notable for the following components:   Heparin Unfractionated >1.10 (*)    All other components within normal limits  HEMOGLOBIN AND HEMATOCRIT, BLOOD - Abnormal; Notable for the following components:   Hemoglobin 10.1 (*)    HCT 29.3 (*)    All other components within normal limits  TROPONIN I (HIGH SENSITIVITY) - Abnormal; Notable for the following components:   Troponin I (High Sensitivity) 18 (*)    All other components within normal limits  CULTURE, BLOOD (ROUTINE X 2)  CULTURE, BLOOD (ROUTINE X 2)  URINE CULTURE  RESP PANEL BY RT-PCR (FLU A&B, COVID)  ARPGX2  TSH  T4, FREE  LACTIC ACID, PLASMA  APTT  CBC  APTT  HEPARIN LEVEL (UNFRACTIONATED)  TROPONIN I (HIGH SENSITIVITY)    EKG EKG Interpretation  Date/Time:  Tuesday August 12 2021 17:30:25 EST Ventricular Rate:  95 PR Interval:  92 QRS Duration: 104 QT Interval:  383 QTC Calculation: 399 R Axis:   29 Text Interpretation: Sinus rhythm Ventricular bigeminy Short PR interval Inferior infarct, old Abnormal lateral Q waves No significant change since tracing few minutes ago Confirmed by Linwood DibblesKnapp, Jon [phone]([id number]) on 08/12/2021 5:35:15 PM  Radiology DG Chest Port 1 View  Result Date: 08/12/2021 CLINICAL DATA:  Chest pain and palpitations. EXAM: PORTABLE CHEST 1 VIEW COMPARISON:  07/30/2021 FINDINGS: 1752 hours. The lungs are clear without focal pneumonia, edema, pneumothorax or pleural effusion. Cardiopericardial silhouette is at upper limits of normal for size. Status post CABG. The visualized bony structures of the thorax show no acute abnormality. Telemetry leads overlie the chest. IMPRESSION: Stable.  No acute  findings. Electronically Signed   By: Misty Stanley M.D.   On: 08/12/2021 18:42    Procedures Procedures   CRITICAL CARE Performed by: Larene Pickett   Total critical care time: 75 minutes  Critical care time was exclusive of separately billable procedures and treating other patients.  Critical care was necessary to treat or prevent imminent or life-threatening deterioration.  Critical care was time spent personally by me on the following activities: development of treatment plan with patient and/or surrogate as well as nursing, discussions with consultants, evaluation of patient's response to treatment, examination of patient, obtaining history from patient or surrogate, ordering and performing treatments and interventions, ordering and review of laboratory studies, ordering and review of radiographic studies, pulse oximetry and re-evaluation of patient's  condition.   Medications Ordered in ED Medications  norepinephrine (LEVOPHED) 4mg  in 235mL premix infusion (6 mcg/min Intravenous Rate/Dose Change 08/12/21 2117)  nitroGLYCERIN (NITROSTAT) SL tablet 0.4 mg (has no administration in time range)  acetaminophen (TYLENOL) tablet 650 mg (has no administration in time range)  ondansetron (ZOFRAN) injection 4 mg (has no administration in time range)  zolpidem (AMBIEN) tablet 5 mg (has no administration in time range)  0.9 %  sodium chloride infusion (has no administration in time range)  aspirin chewable tablet 324 mg (has no administration in time range)    Or  aspirin suppository 300 mg (has no administration in time range)  ALPRAZolam (XANAX) tablet 0.25 mg (has no administration in time range)  albuterol (PROVENTIL) (2.5 MG/3ML) 0.083% nebulizer solution 3 mL (has no administration in time range)  apixaban (ELIQUIS) tablet 5 mg (has no administration in time range)  loratadine (CLARITIN) tablet 10 mg (has no administration in time range)  ezetimibe (ZETIA) tablet 10 mg (has no administration in time range)  levETIRAcetam (KEPPRA) tablet 250 mg (has no administration in time range)  levothyroxine (SYNTHROID) tablet 75 mcg (has no administration in time range)  pantoprazole (PROTONIX) EC tablet 40 mg (has no administration in time range)  rosuvastatin (CRESTOR) tablet 5 mg (has no administration in time range)  sodium chloride 0.9 % bolus 1,000 mL (0 mLs Intravenous Stopped 08/12/21 2039)  magnesium sulfate IVPB 2 g 50 mL (0 g Intravenous Stopped 08/12/21 2035)  sodium chloride 0.9 % bolus 1,000 mL (0 mLs Intravenous Stopped 08/12/21 2040)  LORazepam (ATIVAN) injection 1 mg (1 mg Intravenous Given 08/12/21 2035)    ED Course  I have reviewed the triage vital signs and the nursing notes.  Pertinent labs & imaging results that were available during my care of the patient were reviewed by me and considered in my medical decision making (see chart  for details).    MDM Rules/Calculators/A&P                           65 year old female here with episodes of chest pain, palpitations, and near syncope.  Underwent cardioversion 07/30/2021 at [facility].  States she was fine for a few days but symptoms worsening and becoming more frequent here recently.  Is noted to have episodes of bigeminy and is symptomatic with this during exam.  Does have history of underlying thyroid disease.  Will check labs, magnesium, TSH, chest x-ray.  7:36 PM Patient has become hypotensive down into [id number] systolic but responding well to IVF.  Continues to be symptomatic with episodes of bigeminy.  Troponin borderline at 18 which appears around her baseline.  Magnesium is somewhat low at 1.6---  will replace.  TSH WNL.  CXR clear.  Will discuss with cardiology for recommendations.  Cards consulted-- they will evaluate and give recommendations.  8:06 PM BP dropping into [id number] systolic at this time.  She did improve initially with IVF but once stopped drops again.  Given additional IVF, added lactate, UA, blood and urine cultures to assess for possible sepsis although no fever.  Hold on IV abx for now.  Cardiogenic shock also a consideration.  8:19 PM Patient's mental status declining.  Cardiology at bedside, feels that bigeminy is from her wall motion abnormalities seen on last echo 07/22/21..  pressure still soft after total of 2800cc fluid.  We will start levophed and heparin drip, likely needs cath, possibly tonight vs in AM.  Patient becoming increasingly more anxious, given dose of ativan.  Cardiology will admit to CCU.  9:44 PM Patient has improved with levophed, maintaining pressures around [id number] systolic.  She is mentating.  Lactate is normal.  Delta trop also negative.  Cardiology thinks this is old scar mediated PVC's causing this.  Continue levophed for now, they will continue to follow.  UA is negative.  Final Clinical Impression(s) / ED Diagnoses Final  diagnoses:  Bigeminy  Chest pain in adult  Hypotension, unspecified hypotension type    Rx / DC Orders ED Discharge Orders     None        Larene Pickett, PA-C 08/12/21 2312    Dorie Rank, MD 08/13/21 [phone]

## 2021-08-12 NOTE — H&P (Addendum)
Cardiology Admission History and Physical:   Patient ID: [name] MRN: [id number]; DOB: [birth year]   Admission date: 08/12/2021  PCP:  Oneita Hurt, No   [facility] Providers Cardiologist:  Dina Rich, MD        Chief Complaint:  bigeminy, hypotension  Patient Profile:   [name] is a 65 y.o. female with hx CABG 2005, subsequent PCIs, HTN, HLD, GERD, anemia,  who is being seen 08/12/2021 for the evaluation of chest pain, hypotension.  History of Present Illness:   [name] had a cardioversion on 10/26.   After the cardioversion, she had a few good days, but then started getting weak and had orthostatic dizziness.  She was also having sharp chest pain that would last for few minutes and go away.  She does not know what would bring it on.  She did not do anything to make it go away.  She would get several episodes every day.  The episodes were so sharp that it took her breath away.  She has not really been having palpitations.  However, she would get weak and began to feel bad.  When she came to the ER, this was correlated with bigeminy on the monitor.  She was also having episodic chills.  She did not take her temperature and does not know if she was actually having fever.  She would get shaking chills, and then feel very hot.  She would get a little diaphoretic when she felt hot.  She does not have any obvious source of infection, she denies any wounds, she denies any acute illness, no cough or cold symptoms, and no urinary symptoms.  Finally she felt so bad in general, she came to the emergency room.  In the emergency room, she has been hypotensive with SBP's in the 70s at times.  She responded to IV fluids initially, but then her blood pressure would drop again.  Levophed has been ordered.  When she goes into bigeminy, she always feels worse.  When she is not having frequent PVCs or in bigeminy, her heart rate is up to the 90s, she is not bradycardic.  After almost  2 L of IV fluids, and IV magnesium, she feels a little better.  The episodes of bigeminy may be a little less frequent, but she still has them.  She is still not maintaining a blood pressure without the IV fluids.    Past Medical History:  Diagnosis Date   Acute renal failure (HCC)    Anemia    Anxiety    Arthritis    Asthma    Carotid artery disease (HCC)    a. Duplex 50-69% BICA 03/2014.   Chronic back pain    Collagen vascular disease (HCC)    COPD (chronic obstructive pulmonary disease) (HCC)    Coronary artery disease    a. s/p prior CABG 2005. b. BMS 02/2009 to VG-PDA. c. NSTEMI 08/2010 s/p PTCA/DES to SVG-PDA in previously stented area. d. DES to SVG to RCA on 05/24/14  c. LHC 06/04/2015 w/ no sig change in her anatomy and Rx managment   DDD (degenerative disc disease)    Depression    Diverticulitis    4 documented episodes by CT; most recent April 2012   DIVERTICULITIS OF COLON 07/14/2010   Qualifier: Diagnosis of  By: [place].    GERD (gastroesophageal reflux disease)    History of blood transfusion    HTN (hypertension)    Hyperlipidemia  Hypertension    Hypothyroidism    Ischemic cardiomyopathy    a. EF 40% in 2012 by cath. b. improved to 50-55% by cath 05/2014  c. EF 35-40% by [facility] 05/2015   Kidney stones    Seizures (HCC)    Sinus bradycardia    Sleep apnea    a. Mild-to-moderate obstructive sleep apnea diagnosed in April 2012 by sleep study.   Tension headache     Past Surgical History:  Procedure Laterality Date   APPENDECTOMY  ~ [place]  Jan 2012   patent stents in RCA graft; CAD stable   CARDIAC CATHETERIZATION N/A 06/03/2015   Procedure: Left Heart Cath and Cors/Grafts Angiography;  Surgeon: Belva Crome, MD;  Location: [facility];  Service: Cardiovascular;  Laterality: N/A;   CARPAL TUNNEL WITH CUBITAL TUNNEL Right 2001   CORONARY ANGIOPLASTY WITH STENT PLACEMENT  2011   Promus Premier DES, 3.5 mm x 12 mm, for  99% proximal stent ISR in the SVG-RPDA    CORONARY ANGIOPLASTY WITH STENT PLACEMENT  05/24/2014   CORONARY ARTERY BYPASS GRAFT  2005   LIMA to AL, SVG to LAD, SVG to Cx, SVG-PDA   KNEE ARTHROSCOPY W/ ACL RECONSTRUCTION Left 2001   LAPAROSCOPIC CHOLECYSTECTOMY  2010   LEFT HEART CATHETERIZATION WITH CORONARY/GRAFT ANGIOGRAM N/A 05/24/2014   Procedure: LEFT HEART CATHETERIZATION WITH Beatrix Fetters;  Surgeon: Leonie Man, MD; EF 50-55%, LAD & D1 100%, LIMA-D1 OK, CFX OK, OM1 & OM2 100%, lat OM [place] 100%, RCA 100%; pSVG-RPDA 99% rx w/ scoring balloon PTCA & 3.5 x 12 mm Promus DES; SVG-LAD, SVG-OM known occluded       TUBAL LIGATION  ~ [place]  ~ 1993     Medications Prior to Admission: Prior to Admission medications   Medication Sig Start Date End Date Taking? Authorizing Provider  acetaminophen (TYLENOL) 650 MG CR tablet Take 1,300 mg by mouth every 8 (eight) hours as needed for pain.    [provider]  albuterol (VENTOLIN HFA) 108 (90 Base) MCG/ACT inhaler Inhale 2 puffs into the lungs every 6 (six) hours as needed for wheezing or shortness of breath.    [provider]  amLODipine (NORVASC) 10 MG tablet TAKE ONE TABLET BY MOUTH ONCE DAILY. 02/28/21   Arnoldo Lenis, MD  apixaban (ELIQUIS) 5 MG TABS tablet Take 1 tablet (5 mg total) by mouth 2 (two) times daily. 06/16/21   Verta Ellen., NP  benazepril (LOTENSIN) 10 MG tablet Take 10 mg by mouth daily.    [provider]  cetirizine (ZYRTEC ALLERGY) 10 MG tablet Take 1 tablet (10 mg total) by mouth daily. 06/05/20   Avegno, Darrelyn Hillock, FNP  ezetimibe (ZETIA) 10 MG tablet TAKE (1) TABLET BY MOUTH ONCE DAILY. Patient taking differently: Take 10 mg by mouth daily. 02/28/21   Arnoldo Lenis, MD  furosemide (LASIX) 80 MG tablet TAKE ONE TABLET DAILY. 06/17/21   Arnoldo Lenis, MD  hydrOXYzine (ATARAX/VISTARIL) 25 MG tablet Take 1 tablet (25 mg total) by mouth every 6 (six)  hours as needed for anxiety. 07/30/21   Marcello Fennel, PA-C  isosorbide mononitrate (IMDUR) 60 MG 24 hr tablet Take 1 tablet (60 mg total) by mouth daily. 06/16/21   Verta Ellen., NP  levETIRAcetam (KEPPRA) 250 MG tablet TAKE (1) TABLET BY MOUTH TWICE DAILY. Patient taking differently: Take 250 mg by mouth 2 (two) times daily. 06/08/21  Anabel Halon, MD  levothyroxine (SYNTHROID) 75 MCG tablet Take 1 tablet (75 mcg total) by mouth daily before breakfast. 06/16/21   Netta Neat., NP  metoprolol succinate (TOPROL-XL) 50 MG 24 hr tablet Take 1 tablet (50 mg total) by mouth daily. 07/25/20   Netta Neat., NP  Multiple Vitamin (MULTIVITAMIN WITH MINERALS) TABS tablet Take 1 tablet by mouth 2 (two) times daily. Patient taking differently: Take 1 tablet by mouth daily. 08/28/13   Antoine Poche, MD  nitroGLYCERIN (NITROSTAT) 0.4 MG SL tablet DISSOLVE 1 TABLET UNDER TONGUE EVERY 5 MINUTES UP TO 15 MIN FOR CHESTPAIN. IF NO RELIEF CALL 911. 06/16/21   Netta Neat., NP  Omega 3 1000 MG CAPS Take 1 capsule (1,000 mg total) by mouth 2 (two) times daily. 08/27/20   Netta Neat., NP  omeprazole (PRILOSEC) 20 MG capsule TAKE (1) CAPSULE BY MOUTH ONCE DAILY. 08/06/21   Antoine Poche, MD  potassium chloride SA (KLOR-CON) 20 MEQ tablet TAKE (1) TABLET BY MOUTH EACH MORNING. Patient taking differently: Take 20 mEq by mouth daily. 02/28/21   Antoine Poche, MD  rosuvastatin (CRESTOR) 5 MG tablet Take 1 tablet (5 mg total) by mouth daily. 07/25/20   Netta Neat., NP  sacubitril-valsartan (ENTRESTO) 24-26 MG Take 1 tablet by mouth 2 (two) times daily. (08/07/2021 - holding currently) 08/07/21   Cailen Mihalik, Dorothe Pea, MD     Allergies:    Allergies  Allergen Reactions   Pneumococcal Vaccines Anxiety, Palpitations and Cough   Adhesive [Tape] Itching and Other (See Comments)    Blisters (pls use paper tape)   Crestor [Rosuvastatin] Other (See Comments)    Muscle  pain   Lipitor [Atorvastatin] Other (See Comments)    Cramps   Morphine And Related Itching    Pt states she takes with benadryl    Social History:   Social History   Socioeconomic History   Marital status: Legally Separated    Spouse name: Not on file   Number of children: 3   Years of education: Not on file   Highest education level: Not on file  Occupational History   Occupation: disabled  Tobacco Use   Smoking status: Former    Packs/day: 0.50    Years: 21.00    Pack years: 10.50    Types: Cigarettes    Start date: 03/19/1993    Quit date: 07/29/2015    Years since quitting: 6.0   Smokeless tobacco: Never   Tobacco comments:    smokes some time/go weeks without smoking  Vaping Use   Vaping Use: Never used  Substance and Sexual Activity   Alcohol use: No    Alcohol/week: 0.0 standard drinks   Drug use: Not Currently    Types: Cocaine, Marijuana    Comment: last month   Sexual activity: Never  Other Topics Concern   Not on file  Social History Narrative   Lives in [place], [place].    Social Determinants of Health   Financial Resource Strain: Not on file  Food Insecurity: Not on file  Transportation Needs: Not on file  Physical Activity: Not on file  Stress: Not on file  Social Connections: Not on file  Intimate Partner Violence: Not on file    Family History:   The patient's family history includes Breast cancer in her mother; Heart attack in her father and mother; Heart disease in her father and mother. There is no history of  Colon cancer.    ROS:  Please see the history of present illness.  All other ROS reviewed and negative.     Physical Exam/Data:   Vitals:   08/12/21 2015 08/12/21 2045 08/12/21 2057 08/12/21 2100  BP: 116/69 107/67  (!) 86/53  Pulse: 61 (!) 46 83 77  Resp: 13 18  (!) 9  Temp:      TempSrc:      SpO2: 99% 100%  100%  Weight:      Height:        Intake/Output Summary (Last 24 hours) at 08/12/2021 2113 Last data filed at  08/12/2021 1726 Gross per 24 hour  Intake 800 ml  Output --  Net 800 ml   Last 3 Weights 08/12/2021 08/04/2021 07/30/2021  Weight (lbs) 189 lb 9.5 oz 189 lb 12.8 oz 206 lb  Weight (kg) 86 kg 86.093 kg 93.441 kg     Body mass index is 33.59 kg/m.  General:  Well nourished, well developed, in acute distress HEENT: normal for age Neck: no JVD Vascular: No carotid bruits; Distal pulses 2+ bilaterally   Cardiac:  normal S1, S2; RRR; soft murmur,  Lungs:  clear to auscultation bilaterally, no wheezing, rhonchi or rales  Abd: soft, nontender, no hepatomegaly  Ext: no edema Musculoskeletal:  No deformities, BUE and BLE strength normal and equal Skin: warm and dry  Neuro:  CNs 2-12 intact, no focal abnormalities noted Psych:  Normal affect    EKG:  The ECG that was done today was personally reviewed and demonstrates sinus rhythm with ventricular bigeminy (effective heart rate reduced into the 40s)  Telemetry: Sinus rhythm, heart rate 70s-90s with frequent PVCs and runs of bigeminy  Relevant CV Studies:  ECHO: 07/22/2021  1. Left ventricular ejection fraction, by estimation, is 40 to 45%. The  left ventricle has mildly decreased function. The left ventricle  demonstrates regional wall motion abnormalities (see scoring  diagram/findings for description). There is mild left ventricular hypertrophy. Left ventricular diastolic parameters are indeterminate.   2. Right ventricular systolic function is normal. The right ventricular  size is normal. There is normal pulmonary artery systolic pressure. The  estimated right ventricular systolic pressure is Q000111Q mmHg.   3. The mitral valve is grossly normal. Trivial mitral valve  regurgitation.   4. The aortic valve is tricuspid. Aortic valve regurgitation is not  visualized. Mild aortic valve sclerosis is present, with no evidence of  aortic valve stenosis.   5. The inferior vena cava is normal in size with greater than 50%  respiratory  variability, suggesting right atrial pressure of 3 mmHg.   MYOVIEW: 06/21/2018 Moderate inferior (base, mid, distal), inferolateral (base, mid) defect consistent with scar and possible soft tissue attenuation No significant ischemia LVEF 33% High risk scan due to scar and depressed LVEF  CARDIAC CATH: 06/03/2015 Mid Graft lesion, 30% stenosed. The lesion was previously treated with a stent (unknown type) .   Bypass graft failure with occlusion of the saphenous vein graft to the left anterior descending and the circumflex coronary artery. Patent left internal mammary graft to the diagonal and patent saphenous vein graft to the distal right coronary. The stents within the mid to distal body of the right coronary saphenous vein graft or patent (multiple layers of stent within this region are noted). Severe native vessel coronary disease with total occlusion of the proximal RCA, total occlusion of the left anterior descending and total occlusion of the mid circumflex. The first obtuse marginal supplies  collaterals to the distal circumflex. Left ventricular dysfunction with moderate inferior wall hypokinesis and overall ejection fraction of 35-40%.     RECOMMENDATIONS:   Continued aggressive risk factor modification No anatomy amenable to percutaneous intervention. Diagnostic Dominance: Co-dominant    Laboratory Data:  High Sensitivity Troponin:   Recent Labs  Lab 07/30/21 1337 07/30/21 1545 08/12/21 1745 08/12/21 1941  TROPONINIHS 23* 19* 18* 17      Chemistry Recent Labs  Lab 08/12/21 1745  NA 133*  K 3.9  CL 102  CO2 20*  GLUCOSE 101*  BUN 23  CREATININE 1.23*  CALCIUM 8.7*  MG 1.6*  GFRNONAA 49*  ANIONGAP 11    Magnesium  Date Value Ref Range Status  08/12/2021 1.6 (L) 1.7 - 2.4 mg/dL Final    Comment:    Performed at [facility], [place] [address] [place], [place] [address]  07/30/2021 1.9 1.7 - 2.4 mg/dL Final    Comment:    Performed at [facility], [address] [place], [place] [address]  02/14/2016 2.1 1.5 - 2.5 mg/dL Final    No results for input(s): PROT, ALBUMIN, AST, ALT, ALKPHOS, BILITOT in the last 168 hours. Lipids No results for input(s): CHOL, TRIG, HDL, LABVLDL, LDLCALC, CHOLHDL in the last 168 hours. Hematology Recent Labs  Lab 08/12/21 1745 08/12/21 2041  WBC 5.4  --   RBC 3.29*  --   HGB 10.1* 10.1*  HCT 29.5* 29.3*  MCV 89.7  --   MCH 30.7  --   MCHC 34.2  --   RDW 12.8  --   PLT 314  --    Thyroid  Recent Labs  Lab 08/12/21 1745  TSH 2.344  FREET4 1.06   BNP Recent Labs  Lab 08/12/21 1745  BNP 164.2*    DDimer No results for input(s): DDIMER in the last 168 hours. Lactic Acid, Venous No results found for: LATICACIDVEN   Radiology/Studies:  DG Chest Port 1 View  Result Date: 08/12/2021 CLINICAL DATA:  Chest pain and palpitations. EXAM: PORTABLE CHEST 1 VIEW COMPARISON:  07/30/2021 FINDINGS: 1752 hours. The lungs are clear without focal pneumonia, edema, pneumothorax or pleural effusion. Cardiopericardial silhouette is at upper limits of normal for size. Status post CABG. The visualized bony structures of the thorax show no acute abnormality. Telemetry leads overlie the chest. IMPRESSION: Stable.  No acute findings. Electronically Signed   By: Misty Stanley M.D.   On: 08/12/2021 18:42     Assessment and Plan:   Ventricular bigeminy -She takes metoprolol XL 50 mg daily - She is significantly symptomatic from the PVCs with hypotension, when in bigeminy - She has had IV fluids and IV magnesium, without significant improvement - Her Delene Loll has been on hold for several days because of her symptoms - Her TSH and free T4 were checked this evening and are within normal limits. -Her recent echo showed a new wall motion abnormality, Dr. Carlyle Dolly reviewed the echo and noted that her cath in 2016 did not show any vessels amenable to percutaneous intervention. - Therefore, he increased her  Imdur to 90 mg p.o. daily and she was to follow-up with Dr. Harl Bowie. -Her magnesium was slightly low at 1.6 and was supplemented, recheck in a.m.  2.  CAD:  - She has a mild elevation in her cardiac enzymes, more consistent with demand ischemia than ACS - She has been having some chest pain, but it is atypical - However, she had a new wall  motion abnormality on her recent echo. - The PVCs are quite possibly ischemic in origin - Although at her cath in 2016, there were no lesions amenable to percutaneous intervention, that may have changed - Cardiac catheterization is indicated to further define her anatomy  3.  Hypotension: - There is concern for sepsis. - Her chest x-ray shows no acute findings - Blood and urine cultures are pending - COVID is pending. - Lactic acid is pending - She has had no fever and her white count is not significantly elevated, therefore without source of infection, no antibiotics have been initiated - Continue Levophed for pressure support - Follow-up on labs      Risk Assessment/Risk Scores:    HEAR Score (for undifferentiated chest pain):  HEAR Score: 4     Severity of Illness: The appropriate patient status for this patient is INPATIENT. Inpatient status is judged to be reasonable and necessary in order to provide the required intensity of service to ensure the patient's safety. The patient's presenting symptoms, physical exam findings, and initial radiographic and laboratory data in the context of their chronic comorbidities is felt to place them at high risk for further clinical deterioration. Furthermore, it is not anticipated that the patient will be medically stable for discharge from the hospital within 2 midnights of admission.   * I certify that at the point of admission it is my clinical judgment that the patient will require inpatient hospital care spanning beyond 2 midnights from the point of admission due to high intensity of service, high risk  for further deterioration and high frequency of surveillance required.*   For questions or updates, please contact [place] Please consult [email or web address] for contact info under     Signed, Rosaria Ferries, PA-C  08/12/2021 9:13 PM    Mrs. Daversa is a hx CABG 2005, subsequent PCIs, HTN, HLD, GERD, anemia, atrial fibrillation on eliquis  who is being seen 08/12/2021 for the evaluation of chest pain and palpitaitons.  She was s/p DCCV [facility]ib at [facility] 07/30/[facility]21. She felt well then fell poorly.  She had palpitations and we pre-syncopal. She was noted to feel chills and overall unwell. Her Bps have dropped to the Q000111Q systolics at the bedside in the ED. Ddx includes sepsis v cardiogenic shock. Her EKG shows inferolateral PVC origin c/f possible acute ischemia. She had ectopy prior but not as persistent. She has severe native Lcx/RCA dx as well as her LAD. Her grafts are patents. She's been managed medically. Her inferior/infero lateral wall was down in 2019, looks worse on recent echo. EF declined to 40-45 from 55-60%. Her BNP is only mild, no flash edema, lactate is negative, crt is only mildly elevated. She did not have concerning chest pain. Her ectopy is likely old scar mediated. Her nuclear study showed moderate inferior/inferolateral scar from 2019. This is not cardiogenic shock. She has viral symptoms this could possibly be related to infection. No white count. Cxray is unremarkable.   Recommendations: - continue pressor support MAPS >60; likely can wean - s/p fluid resuscitation - with negative troponin can stop heparin gtt - f/u viral panel   Physical Exam Gen: diaphoretic , anxious Neuro: alert and oriented CV: r,r,r no murmurs. NO JVD Vasc: 2+ radial pulses Pulm: CLAB Abd: non distended Ext: No LE edema Skin: warm and well perfused. DP 2+ BL Psych: normal mood

## 2021-08-12 NOTE — Progress Notes (Addendum)
ANTICOAGULATION CONSULT NOTE - Initial Consult  Pharmacy Consult for heparin Indication: chest pain/ACS  Allergies  Allergen Reactions   Pneumococcal Vaccines Anxiety, Palpitations and Cough   Adhesive [Tape] Itching and Other (See Comments)    Blisters (pls use paper tape)   Crestor [Rosuvastatin] Other (See Comments)    Muscle pain   Lipitor [Atorvastatin] Other (See Comments)    Cramps   Morphine And Related Itching    Pt states she takes with benadryl    Patient Measurements: Height: 5\' 3"  (160 cm) Weight: 86 kg (189 lb 9.5 oz) IBW/kg (Calculated) : 52.4 Heparin Dosing Weight: 71.7 kg  Vital Signs: Temp: 98.2 F (36.8 C) (11/08 1733) Temp Source: Oral (11/08 1733) BP: 72/48 (11/08 2000) Pulse Rate: 75 (11/08 2000)  Labs: Recent Labs    08/12/21 1745  HGB 10.1*  HCT 29.5*  PLT 314  CREATININE 1.23*  TROPONINIHS 18*    Estimated Creatinine Clearance: 47.4 mL/min (A) (by C-G formula based on SCr of 1.23 mg/dL (H)).   Medical History: Past Medical History:  Diagnosis Date   Acute renal failure (HCC)    Anemia    Anxiety    Arthritis    Asthma    Carotid artery disease (HCC)    a. Duplex 50-69% BICA 03/2014.   Chronic back pain    Collagen vascular disease (HCC)    COPD (chronic obstructive pulmonary disease) (HCC)    Coronary artery disease    a. s/p prior CABG 2005. b. BMS 02/2009 to VG-PDA. c. NSTEMI 08/2010 s/p PTCA/DES to SVG-PDA in previously stented area. d. DES to SVG to RCA on 05/24/14  c. LHC 06/04/2015 w/ no sig change in her anatomy and Rx managment   DDD (degenerative disc disease)    Depression    Diverticulitis    4 documented episodes by CT; most recent April 2012   DIVERTICULITIS OF COLON 07/14/2010   Qualifier: Diagnosis of  By: 09/13/2010.    GERD (gastroesophageal reflux disease)    History of blood transfusion    HTN (hypertension)    Hyperlipidemia    Hypertension    Hypothyroidism    Ischemic cardiomyopathy    a. EF  40% in 2012 by cath. b. improved to 50-55% by cath 05/2014  c. EF 35-40% by [facility] 05/2015   Kidney stones    Seizures (HCC)    Sinus bradycardia    Sleep apnea    a. Mild-to-moderate obstructive sleep apnea diagnosed in April 2012 by sleep study.   Tension headache     Medications: see MAR  Assessment: 65 yo F with new wall motion abnormalities seen on last ECHO, going into bigeminy and dropping SBP in ED to 80s. Heparin consult for [facility]. PTA Eliquis for Afib - recent DCCV on 10/26 at [facility] - doing ok post DCCV.   PTA Eliquis, last dose 11/8 @ 1000 per patient. CBC ok. Might undergo cath tonight/tomorrow pending cardiology plan.  Goal of Therapy:  Heparin level 0.3-0.7 units/ml aPTT 66-102 seconds Monitor platelets by anticoagulation protocol: Yes   Plan: No heparin bolus since PTA Eliquis Start heparin infusion at 1000 units/hr Check anti-Xa level in 6 hours and daily while on heparin Continue to monitor H&H and platelets  13/8, PharmD, [facility] Emergency Medicine Clinical Pharmacist ED RPh Phone: [phone] Main RX: [phone]

## 2021-08-13 ENCOUNTER — Inpatient Hospital Stay (HOSPITAL_COMMUNITY): Payer: Medicare Other

## 2021-08-13 DIAGNOSIS — R579 Shock, unspecified: Secondary | ICD-10-CM | POA: Diagnosis present

## 2021-08-13 DIAGNOSIS — I498 Other specified cardiac arrhythmias: Secondary | ICD-10-CM | POA: Diagnosis not present

## 2021-08-13 DIAGNOSIS — I5021 Acute systolic (congestive) heart failure: Secondary | ICD-10-CM | POA: Diagnosis not present

## 2021-08-13 DIAGNOSIS — I5022 Chronic systolic (congestive) heart failure: Secondary | ICD-10-CM | POA: Diagnosis present

## 2021-08-13 DIAGNOSIS — I5023 Acute on chronic systolic (congestive) heart failure: Secondary | ICD-10-CM | POA: Diagnosis not present

## 2021-08-13 DIAGNOSIS — R571 Hypovolemic shock: Secondary | ICD-10-CM | POA: Diagnosis not present

## 2021-08-13 DIAGNOSIS — I4819 Other persistent atrial fibrillation: Secondary | ICD-10-CM | POA: Diagnosis not present

## 2021-08-13 DIAGNOSIS — Z20822 Contact with and (suspected) exposure to covid-19: Secondary | ICD-10-CM | POA: Diagnosis not present

## 2021-08-13 HISTORY — DX: Shock, unspecified: R57.9

## 2021-08-13 LAB — HEMOGLOBIN A1C
Hgb A1c MFr Bld: 6 % — ABNORMAL HIGH (ref 4.8–5.6)
Mean Plasma Glucose: 125.5 mg/dL

## 2021-08-13 LAB — BASIC METABOLIC PANEL
Anion gap: 9 (ref 5–15)
BUN: 8 mg/dL (ref 8–23)
CO2: 21 mmol/L — ABNORMAL LOW (ref 22–32)
Calcium: 8.7 mg/dL — ABNORMAL LOW (ref 8.9–10.3)
Chloride: 107 mmol/L (ref 98–111)
Creatinine, Ser: 0.8 mg/dL (ref 0.44–1.00)
GFR, Estimated: >60 mL/min (ref >60)
Glucose, Bld: 91 mg/dL (ref 70–99)
Potassium: 4.2 mmol/L (ref 3.5–5.1)
Sodium: 137 mmol/L (ref 135–145)

## 2021-08-13 LAB — MAGNESIUM: Magnesium: 2 mg/dL (ref 1.7–2.4)

## 2021-08-13 LAB — COMPREHENSIVE METABOLIC PANEL
ALT: 14 U/L (ref 0–44)
AST: 16 U/L (ref 15–41)
Albumin: 3.4 g/dL — ABNORMAL LOW (ref 3.5–5.0)
Alkaline Phosphatase: 45 U/L (ref 38–126)
Anion gap: 12 (ref 5–15)
BUN: 18 mg/dL (ref 8–23)
CO2: 18 mmol/L — ABNORMAL LOW (ref 22–32)
Calcium: 8.3 mg/dL — ABNORMAL LOW (ref 8.9–10.3)
Chloride: 104 mmol/L (ref 98–111)
Creatinine, Ser: 1.04 mg/dL — ABNORMAL HIGH (ref 0.44–1.00)
GFR, Estimated: 60 mL/min — ABNORMAL LOW (ref >60)
Glucose, Bld: 140 mg/dL — ABNORMAL HIGH (ref 70–99)
Potassium: 3 mmol/L — ABNORMAL LOW (ref 3.5–5.1)
Sodium: 134 mmol/L — ABNORMAL LOW (ref 135–145)
Total Bilirubin: 0.9 mg/dL (ref 0.3–1.2)
Total Protein: 5.7 g/dL — ABNORMAL LOW (ref 6.5–8.1)

## 2021-08-13 LAB — ECHOCARDIOGRAM COMPLETE
Area-P 1/2: 3.34 cm2
Calc EF: 49.5 %
Height: 63 in
S' Lateral: 4.74 cm
Single Plane A2C EF: 50.4 %
Single Plane A4C EF: 47.8 %
Weight: 3033.53 oz

## 2021-08-13 LAB — CBC
HCT: 28.7 % — ABNORMAL LOW (ref 36.0–46.0)
Hemoglobin: 9.5 g/dL — ABNORMAL LOW (ref 12.0–15.0)
MCH: 29.9 pg (ref 26.0–34.0)
MCHC: 33.1 g/dL (ref 30.0–36.0)
MCV: 90.3 fL (ref 80.0–100.0)
Platelets: 323 10*3/uL (ref 150–400)
RBC: 3.18 MIL/uL — ABNORMAL LOW (ref 3.87–5.11)
RDW: 12.8 % (ref 11.5–15.5)
WBC: 5.8 10*3/uL (ref 4.0–10.5)
nRBC: 0 % (ref 0.0–0.2)

## 2021-08-13 LAB — HIV ANTIBODY (ROUTINE TESTING W REFLEX): HIV Screen 4th Generation wRfx: NONREACTIVE

## 2021-08-13 LAB — CORTISOL: Cortisol, Plasma: 14.6 ug/dL

## 2021-08-13 LAB — HEPARIN LEVEL (UNFRACTIONATED): Heparin Unfractionated: >1.1 IU/mL — ABNORMAL HIGH (ref 0.30–0.70)

## 2021-08-13 LAB — APTT: aPTT: 50 seconds — ABNORMAL HIGH (ref 24–36)

## 2021-08-13 LAB — MRSA NEXT GEN BY PCR, NASAL: MRSA by PCR Next Gen: NOT DETECTED

## 2021-08-13 LAB — LACTIC ACID, PLASMA: Lactic Acid, Venous: 0.7 mmol/L (ref 0.5–1.9)

## 2021-08-13 LAB — TROPONIN I (HIGH SENSITIVITY): Troponin I (High Sensitivity): 28 ng/L — ABNORMAL HIGH (ref <18)

## 2021-08-13 LAB — PROCALCITONIN: Procalcitonin: <0.1 ng/mL

## 2021-08-13 MED ORDER — POTASSIUM CHLORIDE CRYS ER 20 MEQ PO TBCR
40.0000 meq | EXTENDED_RELEASE_TABLET | Freq: Once | ORAL | Status: AC
  Administered 2021-08-13: 40 meq via ORAL
  Filled 2021-08-13: qty 2

## 2021-08-13 MED ORDER — HEPARIN (PORCINE) 25000 UT/250ML-% IV SOLN
1200.0000 [IU]/h | INTRAVENOUS | Status: DC
  Administered 2021-08-13: 1000 [IU]/h via INTRAVENOUS
  Administered 2021-08-14: 1200 [IU]/h via INTRAVENOUS
  Filled 2021-08-13 (×2): qty 250

## 2021-08-13 MED ORDER — ASPIRIN 81 MG PO CHEW
81.0000 mg | CHEWABLE_TABLET | Freq: Every day | ORAL | Status: DC
  Administered 2021-08-13 – 2021-08-15 (×3): 81 mg via ORAL
  Filled 2021-08-13 (×3): qty 1

## 2021-08-13 MED ORDER — LACTATED RINGERS IV SOLN
INTRAVENOUS | Status: DC
Start: 2021-08-13 — End: 2021-08-14

## 2021-08-13 MED ORDER — POTASSIUM CHLORIDE 10 MEQ/100ML IV SOLN
10.0000 meq | INTRAVENOUS | Status: AC
  Administered 2021-08-13 (×4): 10 meq via INTRAVENOUS
  Filled 2021-08-13 (×4): qty 100

## 2021-08-13 MED ORDER — MEXILETINE HCL 150 MG PO CAPS
150.0000 mg | ORAL_CAPSULE | Freq: Three times a day (TID) | ORAL | Status: DC
  Administered 2021-08-13 – 2021-08-15 (×7): 150 mg via ORAL
  Filled 2021-08-13 (×10): qty 1

## 2021-08-13 NOTE — Progress Notes (Signed)
ANTICOAGULATION CONSULT NOTE   Pharmacy Consult for heparin Indication: chest pain/ACS  Allergies  Allergen Reactions   Pneumococcal Vaccines Anxiety, Palpitations and Cough   Adhesive [Tape] Itching and Other (See Comments)    Blisters (pls use paper tape)   Crestor [Rosuvastatin] Other (See Comments)    Muscle pain   Lipitor [Atorvastatin] Other (See Comments)    Cramps   Morphine And Related Itching    Pt states she takes with benadryl    Patient Measurements: Height: 5\' 3"  (160 cm) Weight: 86 kg (189 lb 9.5 oz) IBW/kg (Calculated) : 52.4 Heparin Dosing Weight: 71.7 kg  Vital Signs: Temp: 98.2 F (36.8 C) (11/09 1500) Temp Source: Oral (11/09 1500) BP: 135/65 (11/09 1700) Pulse Rate: 76 (11/09 1700)  Labs: Recent Labs    08/12/21 1745 08/12/21 1941 08/12/21 2041 08/13/21 0109 08/13/21 1627  HGB 10.1*  --  10.1* 9.5*  --   HCT 29.5*  --  29.3* 28.7*  --   PLT 314  --   --  323  --   APTT  --   --  33  --  50*  HEPARINUNFRC  --   --  >1.10* >1.10*  --   CREATININE 1.23*  --   --  1.04* 0.80  TROPONINIHS 18* 17  --  28*  --     Estimated Creatinine Clearance: 72.8 mL/min (by C-G formula based on SCr of 0.8 mg/dL).   Medical History: Past Medical History:  Diagnosis Date   Acute renal failure (HCC)    Anemia    Anxiety    Arthritis    Asthma    Carotid artery disease (HCC)    a. Duplex 50-69% BICA 03/2014.   Chronic back pain    Collagen vascular disease (HCC)    COPD (chronic obstructive pulmonary disease) (HCC)    Coronary artery disease    a. s/p prior CABG 2005. b. BMS 02/2009 to VG-PDA. c. NSTEMI 08/2010 s/p PTCA/DES to SVG-PDA in previously stented area. d. DES to SVG to RCA on 05/24/14  c. LHC 06/04/2015 w/ no sig change in her anatomy and Rx managment   DDD (degenerative disc disease)    Depression    Diverticulitis    4 documented episodes by CT; most recent April 2012   DIVERTICULITIS OF COLON 07/14/2010   Qualifier: Diagnosis of  By: 09/13/2010.    GERD (gastroesophageal reflux disease)    History of blood transfusion    HTN (hypertension)    Hyperlipidemia    Hypertension    Hypothyroidism    Ischemic cardiomyopathy    a. EF 40% in 2012 by cath. b. improved to 50-55% by cath 05/2014  c. EF 35-40% by [facility] 05/2015   Kidney stones    Seizures (HCC)    Sinus bradycardia    Sleep apnea    a. Mild-to-moderate obstructive sleep apnea diagnosed in April 2012 by sleep study.   Tension headache     Medications: see MAR  Assessment: 65 yo F with new wall motion abnormalities seen on last ECHO, going into bigeminy and dropping SBP in ED to 80s. Heparin consult for [facility]. PTA Eliquis for Afib - recent DCCV on 10/26 at [facility] - doing ok post DCCV.   PTA Eliquis, last dose 11/8 @ 1000 per patient. CBC ok. Might undergo cath tonight/tomorrow pending cardiology plan.  Initial aPTT is 50 - below goal on 1000 units/hr.   Goal of Therapy:  Heparin level  0.3-0.7 units/ml aPTT 66-102 seconds Monitor platelets by anticoagulation protocol: Yes   Plan: No heparin bolus since PTA Eliquis Increase Heparin to 1300 units/hr. Repeat aPTT in 6 hours.  Daily aPTT, Heparin level, and CBC while on therapy.   Link Snuffer, PharmD, BCPS, BCCCP Clinical Pharmacist Please refer to [facility] for [facility] Pharmacy numbers 08/13/2021, 6:04 PM

## 2021-08-13 NOTE — Plan of Care (Signed)

## 2021-08-13 NOTE — Progress Notes (Addendum)
RN paged Dr. Lendell Caprice and was given verbal orders to maintain goal SBP > 90, obtain another lactic acid, and to monitor hemodynamic stability regarding ventricular bigeminy until cardiac cath can be performed later today.

## 2021-08-13 NOTE — Progress Notes (Signed)
  Echocardiogram 2D Echocardiogram has been performed.  Delcie Roch 08/13/2021, 9:49 AM

## 2021-08-13 NOTE — Consult Note (Signed)
NAME:  [name], MRN:  [id number], DOB:  12-05-1955, LOS: 1 ADMISSION DATE:  08/12/2021, CONSULTATION DATE:  11/9 REFERRING MD:  Dr. Flora Lipps, CHIEF COMPLAINT:  Hypotension   History of Present Illness:  65 year old female with PMH as below, which is significant for atrial fibrillation on eliquis, HFrEF, CAD s/p CABG 2005, and OSA non-compliant with CPAP.  She recently presented to cardiology office in September of this year for a 33-month follow up appointment at which time she complained of increasing fatigue and lightheadedness. She was found to be in atrial fibrillation at that time, but was rate controlled. She was started on Eliquis and referred for echocardiogram. Echocardiogram was done and showed a new reduction in her ejection fraction to 10-45%. When she followed up in the cardiology clinic she remained symptomatic with dizziness and lightheaded. Sherryll Burger was started. She remained in AF and was scheduled for DCCV, but prior to the procedure she presented to [facility] ED with AF RVR and the cardioversion was done in the ED.   She tells me that since the time of these medications (Eliquis and Entresto) starting her symptoms have gotten worse. She has postural lightheadedness as well as exertional dyspnea with chest pressure radiating to neck and jaw. Both alleviate with rest. She also complains of early satiety with vomiting after almost every meal leading to poor PO intake. Describes a 50 pound weight loss over just a couple of months.   She presented to [facility] ED 11/8 with complaints of weakness and dizziness starting just a few days after her cardioversion on 10/26. Describes getting up from bed and within the 20 ft walk to the bathroom she becomes near syncopal and develops chest pain with dyspnea that resolve with rest. Upon arrival to the ED hypotension was discovered with systolic blood pressure in the 70s, which responded to IVF initially,  but ultimately she needed to be started on  norepinephrine.  She was admitted by cardiology. PCCM was asked to see.    Pertinent  Medical History   has a past medical history of Acute renal failure (HCC), Anemia, Anxiety, Arthritis, Asthma, Carotid artery disease (HCC), Chronic back pain, Collagen vascular disease (HCC), COPD (chronic obstructive pulmonary disease) (HCC), Coronary artery disease, DDD (degenerative disc disease), Depression, Diverticulitis, DIVERTICULITIS OF COLON (07/14/2010), GERD (gastroesophageal reflux disease), History of blood transfusion, HTN (hypertension), Hyperlipidemia, Hypertension, Hypothyroidism, Ischemic cardiomyopathy, Kidney stones, Seizures (HCC), Sinus bradycardia, Sleep apnea, and Tension headache.   Significant Hospital Events: Including procedures, antibiotic start and stop dates in addition to other pertinent events     Interim History / Subjective:    Objective   Blood pressure (!) 88/51, pulse 77, temperature 97.9 F (36.6 C), temperature source Oral, resp. rate 14, height 5\' 3"  (1.6 m), weight 86 kg, SpO2 100 %.        Intake/Output Summary (Last 24 hours) at 08/13/2021 1159 Last data filed at 08/13/2021 0500 Gross per 24 hour  Intake 1597.35 ml  Output 650 ml  Net 947.35 ml   Filed Weights   08/12/21 1733 08/13/21 0439  Weight: 86 kg 86 kg    Examination: General: elderly appearing female in no acute distress HENT: [place]/AT, PERRL, no JVD Lungs: Clear bilateral breath sounds Cardiovascular: RRR, no MRG, no edema.  Abdomen: Soft, non-tender, non-distended Extremities: No acute deformity or ROM limitation Neuro: Alert, oriented, non-focal  Resolved Hospital Problem list     Assessment & Plan:   Shock: likely hypovolemic. She has  had poor PO intake for several reasons over the past few months, which has gotten progressively worse. Poor PO intake, plus ongoing diuretic use. She has received 1.5L IVF resuscitation and has been able to wean off norepinephrine. Echocardiogram has  been reviewed by cardiology and is unchanged from prior. She has no complaints or objective findings consistent with an infectious process.  - MAP goal 58mmHg - Norepinephrine for MAP goal, currently off.  - Negative lactic reassuring.  - Will start some maintenance fluids. Urine is dilute despite hypovolemia I believe she continues to diurese from home medications.   Atrial fibrillation on Eliquis: s/p DCCV 10/26 - transitioned to heparin while inpatient pending cards workup which may include cath  - telemetry monitoring - Management per cardiology  HFrEF - holding home amlodipine, imdur, metoprolol, entresto, and furosemide for hypotension - Management per cardiology  Nausea/Vomiting - monitor for now  Anxiety PTSD - She has been off xanax and SSRI for some time since losing her PCP. Will need outpatient evaluation.     Best Practice (right click and "Reselect all SmartList Selections" daily)   Diet/type: Regular consistency (see orders) DVT prophylaxis: systemic heparin GI prophylaxis: N/A Lines: N/A Foley:  N/A Code Status:  full code Last date of multidisciplinary goals of care discussion []   Labs   CBC: Recent Labs  Lab 08/12/21 1745 08/12/21 2041 08/13/21 0109  WBC 5.4  --  5.8  NEUTROABS 3.3  --   --   HGB 10.1* 10.1* 9.5*  HCT 29.5* 29.3* 28.7*  MCV 89.7  --  90.3  PLT 314  --  [id number]    Basic Metabolic Panel: Recent Labs  Lab 08/12/21 1745 08/13/21 0109  NA 133* 134*  K 3.9 3.0*  CL 102 104  CO2 20* 18*  GLUCOSE 101* 140*  BUN 23 18  CREATININE 1.23* 1.04*  CALCIUM 8.7* 8.3*  MG 1.6* 2.0   GFR: Estimated Creatinine Clearance: 56 mL/min (A) (by C-G formula based on SCr of 1.04 mg/dL (H)). Recent Labs  Lab 08/12/21 1745 08/12/21 2025 08/13/21 0109 08/13/21 0130 08/13/21 0734  PROCALCITON  --   --   --   --  <0.10  WBC 5.4  --  5.8  --   --   LATICACIDVEN  --  0.9  --  0.7  --     Liver Function Tests: Recent Labs  Lab 08/13/21 0109   AST 16  ALT 14  ALKPHOS 45  BILITOT 0.9  PROT 5.7*  ALBUMIN 3.4*   No results for input(s): LIPASE, AMYLASE in the last 168 hours. No results for input(s): AMMONIA in the last 168 hours.  ABG    Component Value Date/Time   PHART  01/19/2011 1110    7.400 CORRECTED ON 04/16 AT 1535: PREVIOUSLY REPORTED AS 7.424   PCO2ART  01/19/2011 1110    38.3 CORRECTED ON 04/16 AT 1535: PREVIOUSLY REPORTED AS 39.0   PO2ART (L) 01/19/2011 1110    75.2 CORRECTED ON 04/16 AT 1535: PREVIOUSLY REPORTED AS 80.4   HCO3  01/19/2011 1110    23.2 CORRECTED ON 04/16 AT 1535: PREVIOUSLY REPORTED AS 25.1   TCO2 24 06/03/2015 1353   ACIDBASEDEF 0.9 01/19/2011 1110   O2SAT  01/19/2011 1110    95.6 CORRECTED ON 04/16 AT 1535: PREVIOUSLY REPORTED AS 96.2     Coagulation Profile: No results for input(s): INR, PROTIME in the last 168 hours.  Cardiac Enzymes: No results for input(s): CKTOTAL, CKMB, CKMBINDEX, TROPONINI in the  last 168 hours.  HbA1C: Hgb A1c MFr Bld  Date/Time Value Ref Range Status  08/13/2021 01:09 AM 6.0 (H) 4.8 - 5.6 % Final    Comment:    (NOTE) Pre diabetes:          5.7%-6.4%  Diabetes:              >6.4%  Glycemic control for   <7.0% adults with diabetes   02/14/2016 07:23 AM 6.0 (H) <5.7 % Final    Comment:      For someone without known diabetes, a hemoglobin A1c value between 5.7% and 6.4% is consistent with prediabetes and should be confirmed with a follow-up test.   For someone with known diabetes, a value <7% indicates that their diabetes is well controlled. A1c targets should be individualized based on duration of diabetes, age, co-morbid conditions and other considerations.   This assay result is consistent with an increased risk of diabetes.   Currently, no consensus exists regarding use of hemoglobin A1c for diagnosis of diabetes in children.       CBG: Recent Labs  Lab 08/12/21 2313  GLUCAP 130*    Review of Systems:   Bolds are  positive  Constitutional: weight loss, gain, night sweats, Fevers, chills, fatigue .  HEENT: headaches, Sore throat, sneezing, nasal congestion, post nasal drip, Difficulty swallowing, Tooth/dental problems, visual complaints visual changes, ear ache CV:  chest pain, radiates:neck jaw,Orthopnea, PND, swelling in lower extremities, dizziness, palpitations, syncope.  GI  heartburn, indigestion, abdominal pain, nausea, vomiting, diarrhea, change in bowel habits, loss of appetite, bloody stools.  Resp: cough, productive: , hemoptysis, dyspnea, chest pain, pleuritic.  Skin: rash or itching or icterus GU: dysuria, change in color of urine, urgency or frequency. flank pain, hematuria  MS: joint pain or swelling. decreased range of motion  Psych: change in mood or affect. depression or anxiety.  Neuro: difficulty with speech, weakness, numbness, ataxia    Past Medical History:  She,  has a past medical history of Acute renal failure ([place]), Anemia, Anxiety, Arthritis, Asthma, Carotid artery disease ([place]), Chronic back pain, Collagen vascular disease ([place]), COPD (chronic obstructive pulmonary disease) ([place]), Coronary artery disease, DDD (degenerative disc disease), Depression, Diverticulitis, DIVERTICULITIS OF COLON (07/14/2010), GERD (gastroesophageal reflux disease), History of blood transfusion, HTN (hypertension), Hyperlipidemia, Hypertension, Hypothyroidism, Ischemic cardiomyopathy, Kidney stones, Seizures ([place]), Sinus bradycardia, Sleep apnea, and Tension headache.   Surgical History:   Past Surgical History:  Procedure Laterality Date   APPENDECTOMY  ~ [place]  Jan 2012   patent stents in RCA graft; CAD stable   CARDIAC CATHETERIZATION N/A 06/03/2015   Procedure: Left Heart Cath and Cors/Grafts Angiography;  Surgeon: Belva Crome, MD;  Location: [facility];  Service: Cardiovascular;  Laterality: N/A;   CARPAL TUNNEL WITH CUBITAL TUNNEL Right 2001   CORONARY  ANGIOPLASTY WITH STENT PLACEMENT  2011   Promus Premier DES, 3.5 mm x 12 mm, for 99% proximal stent ISR in the SVG-RPDA    CORONARY ANGIOPLASTY WITH STENT PLACEMENT  05/24/2014   CORONARY ARTERY BYPASS GRAFT  2005   LIMA to AL, SVG to LAD, SVG to Cx, SVG-PDA   KNEE ARTHROSCOPY W/ ACL RECONSTRUCTION Left 2001   LAPAROSCOPIC CHOLECYSTECTOMY  2010   LEFT HEART CATHETERIZATION WITH CORONARY/GRAFT ANGIOGRAM N/A 05/24/2014   Procedure: LEFT HEART CATHETERIZATION WITH Beatrix Fetters;  Surgeon: Leonie Man, MD; EF 50-55%, LAD & D1 100%, LIMA-D1 OK, CFX OK, OM1 & OM2 100%, lat OM  branch 100%, RCA 100%; pSVG-RPDA 99% rx w/ scoring balloon PTCA & 3.5 x 12 mm Promus DES; SVG-LAD, SVG-OM known occluded       TUBAL LIGATION  ~ [place]  ~ 1993     Social History:   reports that she quit smoking about 6 years ago. Her smoking use included cigarettes. She started smoking about 28 years ago. She has a 10.50 pack-year smoking history. She has never used smokeless tobacco. She reports that she does not currently use drugs after having used the following drugs: Cocaine and Marijuana. She reports that she does not drink alcohol.   Family History:  Her family history includes Breast cancer in her mother; Heart attack in her father and mother; Heart disease in her father and mother. There is no history of Colon cancer.   Allergies Allergies  Allergen Reactions   Pneumococcal Vaccines Anxiety, Palpitations and Cough   Adhesive [Tape] Itching and Other (See Comments)    Blisters (pls use paper tape)   Crestor [Rosuvastatin] Other (See Comments)    Muscle pain   Lipitor [Atorvastatin] Other (See Comments)    Cramps   Morphine And Related Itching    Pt states she takes with benadryl     Home Medications  Prior to Admission medications   Medication Sig Start Date End Date Taking? Authorizing Provider  acetaminophen (TYLENOL) 650 MG CR tablet Take 1,300 mg by mouth every 8  (eight) hours as needed for pain.    [provider]  albuterol (VENTOLIN HFA) 108 (90 Base) MCG/ACT inhaler Inhale 2 puffs into the lungs every 6 (six) hours as needed for wheezing or shortness of breath.    [provider]  amLODipine (NORVASC) 10 MG tablet TAKE ONE TABLET BY MOUTH ONCE DAILY. 02/28/21   Arnoldo Lenis, MD  apixaban (ELIQUIS) 5 MG TABS tablet Take 1 tablet (5 mg total) by mouth 2 (two) times daily. 06/16/21   Verta Ellen., NP  benazepril (LOTENSIN) 10 MG tablet Take 10 mg by mouth daily.    [provider]  cetirizine (ZYRTEC ALLERGY) 10 MG tablet Take 1 tablet (10 mg total) by mouth daily. 06/05/20   Avegno, Darrelyn Hillock, FNP  ezetimibe (ZETIA) 10 MG tablet TAKE (1) TABLET BY MOUTH ONCE DAILY. Patient taking differently: Take 10 mg by mouth daily. 02/28/21   Arnoldo Lenis, MD  furosemide (LASIX) 80 MG tablet TAKE ONE TABLET DAILY. 06/17/21   Arnoldo Lenis, MD  hydrOXYzine (ATARAX/VISTARIL) 25 MG tablet Take 1 tablet (25 mg total) by mouth every 6 (six) hours as needed for anxiety. 07/30/21   Marcello Fennel, PA-C  isosorbide mononitrate (IMDUR) 60 MG 24 hr tablet Take 1 tablet (60 mg total) by mouth daily. 06/16/21   Verta Ellen., NP  levETIRAcetam (KEPPRA) 250 MG tablet TAKE (1) TABLET BY MOUTH TWICE DAILY. Patient taking differently: Take 250 mg by mouth 2 (two) times daily. 06/08/21   Lindell Spar, MD  levothyroxine (SYNTHROID) 75 MCG tablet Take 1 tablet (75 mcg total) by mouth daily before breakfast. 06/16/21   Verta Ellen., NP  metoprolol succinate (TOPROL-XL) 50 MG 24 hr tablet Take 1 tablet (50 mg total) by mouth daily. 07/25/20   Verta Ellen., NP  Multiple Vitamin (MULTIVITAMIN WITH MINERALS) TABS tablet Take 1 tablet by mouth 2 (two) times daily. Patient taking differently: Take 1 tablet by mouth daily. 08/28/13   Arnoldo Lenis, MD  nitroGLYCERIN (NITROSTAT) 0.4 MG SL tablet DISSOLVE 1 TABLET UNDER  TONGUE EVERY 5 MINUTES UP TO 15 MIN FOR CHESTPAIN. IF NO RELIEF CALL 911. 06/16/21   Verta Ellen., NP  Omega 3 1000 MG CAPS Take 1 capsule (1,000 mg total) by mouth 2 (two) times daily. 08/27/20   Verta Ellen., NP  omeprazole (PRILOSEC) 20 MG capsule TAKE (1) CAPSULE BY MOUTH ONCE DAILY. 08/06/21   Arnoldo Lenis, MD  potassium chloride SA (KLOR-CON) 20 MEQ tablet TAKE (1) TABLET BY MOUTH EACH MORNING. Patient taking differently: Take 20 mEq by mouth daily. 02/28/21   Arnoldo Lenis, MD  rosuvastatin (CRESTOR) 5 MG tablet Take 1 tablet (5 mg total) by mouth daily. 07/25/20   Verta Ellen., NP  sacubitril-valsartan (ENTRESTO) 24-26 MG Take 1 tablet by mouth 2 (two) times daily. (08/07/2021 - holding currently) 08/07/21   Arnoldo Lenis, MD     Critical care time: 48 mins required due to hypovolemic shock     Georgann Housekeeper, AGACNP-BC [place] for personal pager PCCM on call pager [phone] until 7pm. Please call Elink 7p-7a. [phone]  08/13/2021 12:51 PM

## 2021-08-13 NOTE — Progress Notes (Signed)
Cardiology Progress Note  Patient ID: MAKENZYE ENGLEHART MRN: [id number] DOB: December 16, 1955 Date of Encounter: 08/13/2021  Primary Cardiologist: Carlyle Dolly, MD  Subjective   Chief Complaint: Weakness/fatigue  HPI: Admitted overnight with shock.  Has responded to fluids and Levophed.  Intermittent PVCs with high burden per telemetry review.  Reports she feels weak.  Denies any chest pain or trouble breathing.  Bedside echocardiogram performed by me shows EF 40 to 45% with inferior lateral inferior hypokinesis.  IVC is collapsible.  ROS:  All other ROS reviewed and negative. Pertinent positives noted in the HPI.     Inpatient Medications  Scheduled Meds:  aspirin  81 mg Oral Daily   Chlorhexidine Gluconate Cloth  6 each Topical Q0600   ezetimibe  10 mg Oral Daily   levETIRAcetam  250 mg Oral BID   levothyroxine  75 mcg Oral QAC breakfast   loratadine  10 mg Oral Daily   mexiletine  150 mg Oral Q8H   pantoprazole  40 mg Oral Daily   potassium chloride  40 mEq Oral Once   rosuvastatin  5 mg Oral Daily   Continuous Infusions:  heparin     norepinephrine (LEVOPHED) Adult infusion 9 mcg/min (08/13/21 0500)   PRN Meds: acetaminophen, albuterol, ALPRAZolam, nitroGLYCERIN, ondansetron (ZOFRAN) IV, zolpidem   Vital Signs   Vitals:   08/13/21 0439 08/13/21 0445 08/13/21 0500 08/13/21 0515  BP:  (!) 123/55 (!) 118/58   Pulse:  (!) 44 (!) 37 (!) 39  Resp:  16 18 16   Temp:      TempSrc:      SpO2:  90% 100% 100%  Weight: 86 kg     Height:        Intake/Output Summary (Last 24 hours) at 08/13/2021 0757 Last data filed at 08/13/2021 0500 Gross per 24 hour  Intake 1597.35 ml  Output 650 ml  Net 947.35 ml   Last 3 Weights 08/13/2021 08/12/2021 08/04/2021  Weight (lbs) 189 lb 9.5 oz 189 lb 9.5 oz 189 lb 12.8 oz  Weight (kg) 86 kg 86 kg 86.093 kg      Telemetry  Overnight telemetry shows sinus rhythm 80 bpm, rare PVCs, which I personally reviewed.   ECG  The most recent ECG  shows sinus rhythm, heart rate 68, inferior Q waves noted, which I personally reviewed.   Physical Exam   Vitals:   08/13/21 0439 08/13/21 0445 08/13/21 0500 08/13/21 0515  BP:  (!) 123/55 (!) 118/58   Pulse:  (!) 44 (!) 37 (!) 39  Resp:  16 18 16   Temp:      TempSrc:      SpO2:  90% 100% 100%  Weight: 86 kg     Height:        Intake/Output Summary (Last 24 hours) at 08/13/2021 0757 Last data filed at 08/13/2021 0500 Gross per 24 hour  Intake 1597.35 ml  Output 650 ml  Net 947.35 ml    Last 3 Weights 08/13/2021 08/12/2021 08/04/2021  Weight (lbs) 189 lb 9.5 oz 189 lb 9.5 oz 189 lb 12.8 oz  Weight (kg) 86 kg 86 kg 86.093 kg    Body mass index is 33.59 kg/m.   General: Ill-appearing Head: Atraumatic, normal size  Eyes: PEERLA, EOMI  Neck: Supple, JVD 5 to 7 cm of water Endocrine: No thryomegaly Cardiac: Normal S1, S2; RRR; no murmurs, rubs, or gallops Lungs: Clear to auscultation bilaterally, no wheezing, rhonchi or rales  Abd: Soft, nontender, no hepatomegaly  Ext:  No edema, pulses 2+, warm extremities Musculoskeletal: No deformities, BUE and BLE strength normal and equal Skin: Warm and dry, no rashes   Neuro: Alert and oriented to person, place, time, and situation, CNII-XII grossly intact, no focal deficits  Psych: Normal mood and affect   Labs  High Sensitivity Troponin:   Recent Labs  Lab 07/30/21 1337 07/30/21 1545 08/12/21 1745 08/12/21 1941 08/13/21 0109  TROPONINIHS 23* 19* 18* 17 28*     Cardiac EnzymesNo results for input(s): TROPONINI in the last 168 hours. No results for input(s): TROPIPOC in the last 168 hours.  Chemistry Recent Labs  Lab 08/12/21 1745 08/13/21 0109  NA 133* 134*  K 3.9 3.0*  CL 102 104  CO2 20* 18*  GLUCOSE 101* 140*  BUN 23 18  CREATININE 1.23* 1.04*  CALCIUM 8.7* 8.3*  PROT  --  5.7*  ALBUMIN  --  3.4*  AST  --  16  ALT  --  14  ALKPHOS  --  45  BILITOT  --  0.9  GFRNONAA 49* 60*  ANIONGAP 11 12     Hematology Recent Labs  Lab 08/12/21 1745 08/12/21 2041 08/13/21 0109  WBC 5.4  --  5.8  RBC 3.29*  --  3.18*  HGB 10.1* 10.1* 9.5*  HCT 29.5* 29.3* 28.7*  MCV 89.7  --  90.3  MCH 30.7  --  29.9  MCHC 34.2  --  33.1  RDW 12.8  --  12.8  PLT 314  --  323   BNP Recent Labs  Lab 08/12/21 1745  BNP 164.2*    DDimer No results for input(s): DDIMER in the last 168 hours.   Radiology  DG Chest Port 1 View  Result Date: 08/12/2021 CLINICAL DATA:  Chest pain and palpitations. EXAM: PORTABLE CHEST 1 VIEW COMPARISON:  07/30/2021 FINDINGS: 1752 hours. The lungs are clear without focal pneumonia, edema, pneumothorax or pleural effusion. Cardiopericardial silhouette is at upper limits of normal for size. Status post CABG. The visualized bony structures of the thorax show no acute abnormality. Telemetry leads overlie the chest. IMPRESSION: Stable.  No acute findings. Electronically Signed   By: Misty Stanley M.D.   On: 08/12/2021 18:42    Cardiac Studies  TTE 07/22/2021  1. Left ventricular ejection fraction, by estimation, is 40 to 45%. The  left ventricle has mildly decreased function. The left ventricle  demonstrates regional wall motion abnormalities (see scoring  diagram/findings for description). There is mild left  ventricular hypertrophy. Left ventricular diastolic parameters are  indeterminate.   2. Right ventricular systolic function is normal. The right ventricular  size is normal. There is normal pulmonary artery systolic pressure. The  estimated right ventricular systolic pressure is [id number] mmHg.   3. The mitral valve is grossly normal. Trivial mitral valve  regurgitation.   4. The aortic valve is tricuspid. Aortic valve regurgitation is not  visualized. Mild aortic valve sclerosis is present, with no evidence of  aortic valve stenosis.   5. The inferior vena cava is normal in size with greater than 50%  respiratory variability, suggesting right atrial pressure of 3 mmHg.    LHC 06/03/2015 Mid Graft lesion, 30% stenosed. The lesion was previously treated with a stent (unknown type) .   Bypass graft failure with occlusion of the saphenous vein graft to the left anterior descending and the circumflex coronary artery. Patent left internal mammary graft to the diagonal and patent saphenous vein graft to the distal right coronary. The stents  within the mid to distal body of the right coronary saphenous vein graft or patent (multiple layers of stent within this region are noted). Severe native vessel coronary disease with total occlusion of the proximal RCA, total occlusion of the left anterior descending and total occlusion of the mid circumflex. The first obtuse marginal supplies collaterals to the distal circumflex. Left ventricular dysfunction with moderate inferior wall hypokinesis and overall ejection fraction of 35-40%.  Patient Profile  HALEYANN WOODFORK is a 65 y.o. female with CAD status post CABG in 2005, ischemic cardiomyopathy, hypertension, hyperlipidemia, GERD who was admitted on 08/12/2021 for shock.  Bedside echocardiogram shows preserved EF with collapsing IVC.  She has not been eating or drinking well since her cardioversion on 07/30/2021.  Presumptive diagnosis is hypovolemic shock.  Assessment & Plan   #Shock, likely hypovolemic -Admitted with hypotension and frequent PVCs.  She was seen in the [facility] emergency room on 07/30/2021.  She was cardioverted there.  She reports since that time she has felt very poor and she has not been eating or drinking.  She reports no energy.  She reports she is lost 15 pounds. -She presented to the emergency room with systolic blood pressures in the 70s.  It did respond to intravenous fluids but would drop.  She was placed on Levophed overnight.  Blood pressure has normalized and was in the 140s during my examination. -Laboratory data notable for slight AKI with creatinine 1.2.  BNP 164.  Troponins 18 and 17 on repeat.   Lactic acid 0.9.  WBC 5.4.  Hemoglobin 10.1. -I performed a bedside echocardiogram.  This shows EF is largely preserved from prior study.  EF is roughly 40 to 45% with inferior inferolateral severe hypokinesis. -Chest x-ray is clear.  She denies any infectious symptoms.  UA unremarkable.  No symptoms of dysuria. -Respiratory viral panel negative.  Blood cultures are pending.  Procalcitonin pending. -Denies diarrhea.  Reports she can just not eat or drink due to weakness and fatigue. -Overall I believe she is just volume down from not eating or drinking over the past 2 weeks.  She is +1.5 L.  We will hold on further volume resuscitation.  We will wean Levophed off.  There is a possibility that PVCs are contributing to her symptoms.  We will start mexiletine 150 mg 3 times daily to see if we can help with this.  She may need amiodarone if this does not help. -Hold cardiomyopathy medications for now. -I do not believe she is in cardiogenic shock.  I do not believe her LV function is worsening.  She had a left heart catheterization 2016 with no targets for revascularization.  I will switch her to heparin in case she does not improve.  We may consider left heart catheterization but currently I do not feel this will be necessary.  We will continue the above work-up.  Wean her off Levophed she will remain in the ICU today.  #Ischemic Cardiomyopathy, EF 40-45% #PVCs -Bedside echo is unchanged from prior.  Not believe her shock state is related to worsening congestive heart failure.  BNP is within limits.  Given her hypotension we will hold guideline directed medical therapy.  We will restart this as we are able. -I have ordered mexiletine for PVCs.  We may need to switch to amiodarone.  We will see how she tolerates this.  #Afib s/p DCCV 07/30/2021 -Recent diagnosis of A. fib.  She underwent cardioversion in the ER at [facility] on  07/30/2021. -Given her critical illness we will hold her Eliquis.  I would  like to transition to heparin drip in case she does need left and right heart catheterization.  If her overall condition does not improve she may need this.  See discussion above. -She is maintaining sinus rhythm for now.  We will continue with the above management.  #CAD s/p CABG -Status post CABG in 2005.  Left heart catheterization in 2016 demonstrated occlusion of the vein graft to LAD, occlusion of vein graft to circumflex.  She does have a patent LIMA to the diagonal branch.  She had a patent vein graft to the distal RCA.  There was a stent patent in the vein graft to the RCA. -She has severe native three-vessel disease with an occluded RCA proximally, occluded LAD and occluded circumflex. -She had no targets for revascularization in 2016. -Troponins are negative and flat.  She describes symptoms of chest pain but I believe this is all secondary to her hypovolemic state. -We will continue aspirin and statin therapy.  I do not believe she needs repeat left and right heart catheterization at this time.  Unless her shock does not improve we would consider both of these.  However I believe her poor p.o. intake is a more likely explanation for her symptoms.  We will also treat her PVCs.  FEN -No further intravenous fluids.  +1.5 L - Diet: Heart healthy - DVT PPx: Heparin - Code: Full  For questions or updates, please contact [facility] Please consult [email or web address] for contact info under   CRITICAL CARE Performed by: [facility]  Total critical care time: 45 minutes. Critical care time was exclusive of separately billable procedures and treating other patients. Critical care was necessary to treat or prevent imminent or life-threatening deterioration. Critical care was time spent personally by me on the following activities: development of treatment plan with patient and/or surrogate as well as nursing, discussions with consultants, evaluation of patient's response to treatment,  examination of patient, obtaining history from patient or surrogate, ordering and performing treatments and interventions, ordering and review of laboratory studies, ordering and review of radiographic studies, pulse oximetry and re-evaluation of patient's condition.    Signed, Lenna Gilford. Flora Lipps, MD, [facility] [place]  [facility]  08/13/2021 7:57 AM

## 2021-08-14 DIAGNOSIS — R579 Shock, unspecified: Secondary | ICD-10-CM | POA: Diagnosis not present

## 2021-08-14 DIAGNOSIS — R571 Hypovolemic shock: Secondary | ICD-10-CM | POA: Diagnosis not present

## 2021-08-14 DIAGNOSIS — I5023 Acute on chronic systolic (congestive) heart failure: Secondary | ICD-10-CM | POA: Diagnosis not present

## 2021-08-14 DIAGNOSIS — I4819 Other persistent atrial fibrillation: Secondary | ICD-10-CM | POA: Diagnosis not present

## 2021-08-14 DIAGNOSIS — Z20822 Contact with and (suspected) exposure to covid-19: Secondary | ICD-10-CM | POA: Diagnosis not present

## 2021-08-14 DIAGNOSIS — I498 Other specified cardiac arrhythmias: Secondary | ICD-10-CM | POA: Diagnosis not present

## 2021-08-14 LAB — BASIC METABOLIC PANEL
Anion gap: 6 (ref 5–15)
BUN: 5 mg/dL — ABNORMAL LOW (ref 8–23)
CO2: 24 mmol/L (ref 22–32)
Calcium: 8.9 mg/dL (ref 8.9–10.3)
Chloride: 107 mmol/L (ref 98–111)
Creatinine, Ser: 0.74 mg/dL (ref 0.44–1.00)
GFR, Estimated: >60 mL/min (ref >60)
Glucose, Bld: 91 mg/dL (ref 70–99)
Potassium: 3.8 mmol/L (ref 3.5–5.1)
Sodium: 137 mmol/L (ref 135–145)

## 2021-08-14 LAB — CBC
HCT: 26.8 % — ABNORMAL LOW (ref 36.0–46.0)
Hemoglobin: 9 g/dL — ABNORMAL LOW (ref 12.0–15.0)
MCH: 30.4 pg (ref 26.0–34.0)
MCHC: 33.6 g/dL (ref 30.0–36.0)
MCV: 90.5 fL (ref 80.0–100.0)
Platelets: 240 10*3/uL (ref 150–400)
RBC: 2.96 MIL/uL — ABNORMAL LOW (ref 3.87–5.11)
RDW: 13 % (ref 11.5–15.5)
WBC: 3.6 10*3/uL — ABNORMAL LOW (ref 4.0–10.5)
nRBC: 0 % (ref 0.0–0.2)

## 2021-08-14 LAB — HEPARIN LEVEL (UNFRACTIONATED): Heparin Unfractionated: >1.1 IU/mL — ABNORMAL HIGH (ref 0.30–0.70)

## 2021-08-14 LAB — URINE CULTURE: Culture: NO GROWTH

## 2021-08-14 LAB — APTT: aPTT: 115 seconds — ABNORMAL HIGH (ref 24–36)

## 2021-08-14 LAB — PROCALCITONIN: Procalcitonin: <0.1 ng/mL

## 2021-08-14 MED ORDER — APIXABAN 5 MG PO TABS
5.0000 mg | ORAL_TABLET | Freq: Two times a day (BID) | ORAL | Status: DC
  Administered 2021-08-14 – 2021-08-15 (×3): 5 mg via ORAL
  Filled 2021-08-14 (×3): qty 1

## 2021-08-14 MED ORDER — METOPROLOL SUCCINATE ER 50 MG PO TB24: 50.0000 mg | ORAL_TABLET | Freq: Every day | ORAL | Status: DC

## 2021-08-14 MED ORDER — METOPROLOL SUCCINATE ER 25 MG PO TB24
25.0000 mg | ORAL_TABLET | Freq: Every day | ORAL | Status: DC
  Administered 2021-08-14 – 2021-08-15 (×2): 25 mg via ORAL
  Filled 2021-08-14 (×2): qty 1

## 2021-08-14 MED ORDER — BENAZEPRIL HCL 5 MG PO TABS
10.0000 mg | ORAL_TABLET | Freq: Every day | ORAL | Status: DC
  Filled 2021-08-14: qty 2

## 2021-08-14 NOTE — Progress Notes (Signed)
Report called to receiving rn [id number]. Patient with no complaints at the current time. Will transfer via WC.

## 2021-08-14 NOTE — Evaluation (Signed)
Physical Therapy Evaluation Patient Details Name: [name] MRN: [id number] DOB: 05-Aug-1956 Today's Date: 08/14/2021  History of Present Illness  The pt is a 65 yo female presenting 11/8 with chest pain, nausea, and vomiting. Of note, the pt had recent cardioversion on 10/26. Upon work-up, pt with frequent PVCs and runs of bigeminy, found to have hypovolemic shock. PMH includes: anxiety, anemia, COPD, CAD s/p CABG in 2005 and NSTEMI in 2011, diverticulitis, HTN, HLD, cardiomyopathy, and sleep apnea.   Clinical Impression  Pt in bed upon arrival of PT, agreeable to evaluation at this time. Prior to admission the pt was completely independent, retired, living alone, but completely independent with mobility and ADLs. The pt reports recent decline in mobility and need for assist from neighbors for ADLs and IADLs over last few weeks. The pt now presents with limitations in functional mobility, endurance, and strength compared to her baseline, but is mobilizing better at this time compared to on admission. The pt was able to complete repeated sit-stand transfers without assist or UE support and 338ft ambulation with no need for AD, even with balance challenges. The pt will benefit from one additional follow up session to ensure capacity for stairs and answer any other questions about safe progression of exercise and activity, but has no additional follow up needs after d/c.        Recommendations for follow up therapy are one component of a multi-disciplinary discharge planning process, led by the attending physician.  Recommendations may be updated based on patient status, additional functional criteria and insurance authorization.  Follow Up Recommendations No PT follow up    Assistance Recommended at Discharge Intermittent Supervision/Assistance  Functional Status Assessment Patient has had a recent decline in their functional status and demonstrates the ability to make significant improvements  in function in a reasonable and predictable amount of time.  Equipment Recommendations  None recommended by PT    Recommendations for Other Services       Precautions / Restrictions Precautions Precautions: Fall Restrictions Weight Bearing Restrictions: No      Mobility  Bed Mobility               General bed mobility comments: pt OOB in recliner at start and end of session    Transfers Overall transfer level: Needs assistance Equipment used: None Transfers: Sit to/from Stand Sit to Stand: Supervision           General transfer comment: supervision for safety, no assist needed    Ambulation/Gait Ambulation/Gait assistance: Supervision Gait Distance (Feet): 370 Feet Assistive device: None Gait Pattern/deviations: WFL(Within Functional Limits) Gait velocity: 0.5 m/s Gait velocity interpretation: 1.31 - 2.62 ft/sec, indicative of limited community ambulator   General Gait Details: slightly slowed gait speed but no instability or LOB even with added balance challenges     Balance Overall balance assessment: Mild deficits observed, not formally tested                               Standardized Balance Assessment Standardized Balance Assessment : Dynamic Gait Index   Dynamic Gait Index Level Surface: Normal Change in Gait Speed: Normal Gait with Horizontal Head Turns: Normal Gait with Vertical Head Turns: Normal Gait and Pivot Turn: Normal Step Over Obstacle: Mild Impairment Step Around Obstacles: Mild Impairment       Pertinent Vitals/Pain Pain Assessment: No/denies pain    Home Living Family/patient expects to be discharged to:: Private  residence Living Arrangements: Alone Available Help at Discharge: Neighbor;Available PRN/intermittently (come over 2-3x/day) Type of Home: House Home Access: Stairs to enter Entrance Stairs-Rails: Right Entrance Stairs-Number of Steps: 5   Home Layout: One level Home Equipment: None       Prior Function Prior Level of Function : Independent/Modified Independent;Driving             Mobility Comments: pt reports typically independent, last week she was cllaing neighbors over for supervision with mobility ADLs Comments: pt reports typically independent but reccently has been relying on neighbors for food     Hand Dominance   Dominant Hand: Right    Extremity/Trunk Assessment   Upper Extremity Assessment Upper Extremity Assessment: Overall WFL for tasks assessed    Lower Extremity Assessment Lower Extremity Assessment: Overall WFL for tasks assessed    Cervical / Trunk Assessment Cervical / Trunk Assessment: Normal  Communication   Communication: No difficulties  Cognition Arousal/Alertness: Awake/alert Behavior During Therapy: WFL for tasks assessed/performed Overall Cognitive Status: Within Functional Limits for tasks assessed                                          General Comments General comments (skin integrity, edema, etc.): VSS on RA    Exercises     Assessment/Plan    PT Assessment Patient needs continued PT services  PT Problem List Decreased balance;Decreased mobility;Decreased activity tolerance       PT Treatment Interventions DME instruction;Gait training;Stair training;Functional mobility training;Therapeutic activities;Therapeutic exercise;Balance training;Patient/family education    PT Goals (Current goals can be found in the Care Plan section)  Acute Rehab PT Goals Patient Stated Goal: to return home to her dog, to get back to where she can carry her dog's 50lb bag of food PT Goal Formulation: With patient Time For Goal Achievement: 08/28/21 Potential to Achieve Goals: Good    Frequency Min 3X/week    AM-PAC PT "6 Clicks" Mobility  Outcome Measure Help needed turning from your back to your side while in a flat bed without using bedrails?: None Help needed moving from lying on your back to sitting on the  side of a flat bed without using bedrails?: None Help needed moving to and from a bed to a chair (including a wheelchair)?: A Little Help needed standing up from a chair using your arms (e.g., wheelchair or bedside chair)?: A Little Help needed to walk in hospital room?: A Little Help needed climbing 3-5 steps with a railing? : A Little 6 Click Score: 20    End of Session Equipment Utilized During Treatment: Gait belt Activity Tolerance: Patient tolerated treatment well Patient left: in chair;with call bell/phone within reach Nurse Communication: Mobility status PT Visit Diagnosis: Other abnormalities of gait and mobility (R26.89)    Time: 6734-1937 PT Time Calculation (min) (ACUTE ONLY): 31 min   Charges:   PT Evaluation $PT Eval Low Complexity: 1 Low PT Treatments $Gait Training: 8-22 mins        Vickki Muff, PT, DPT   Acute Rehabilitation Department Pager #: [phone]  Ronnie Derby 08/14/2021, 1:24 PM

## 2021-08-14 NOTE — Progress Notes (Signed)
  PCCM Interval Note  Chart reviewed.  Off NE as of 11/9 am ~ 0900.  Infectious workup negative.  PCT negative.  Cortisol 14.6.  Shock attributed to hypovolemic.  Has been hemodynamically stable, remains in Afib with stable rate, and is pending transfer to telemetry floor.     Nothing further to add.  PCCM will sign off.  Please call us back if we can be of any further assistance.      Posey Boyer, ACNP [facility] 08/14/2021, 12:04 PM  See Amion for pager If no response to pager, please call PCCM consult pager After 7:00 pm call Elink

## 2021-08-14 NOTE — Progress Notes (Signed)
ANTICOAGULATION CONSULT NOTE - Follow Up Consult  Pharmacy Consult for heparin Indication:  Afib/CAD  Labs: Recent Labs    08/12/21 1745 08/12/21 1941 08/12/21 2041 08/13/21 0109 08/13/21 1627 08/14/21 0114  HGB 10.1*  --  10.1* 9.5*  --  9.0*  HCT 29.5*  --  29.3* 28.7*  --  26.8*  PLT 314  --   --  323  --  240  APTT  --   --  33  --  50* 115*  HEPARINUNFRC  --   --  >1.10* >1.10*  --   --   CREATININE 1.23*  --   --  1.04* 0.80  --   TROPONINIHS 18* 17  --  28*  --   --     Assessment: 65yo female now slightly supratherapeutic on heparin after rate change; no infusion issues or signs of bleeding per RN.  Goal of Therapy:  Heparin level 0.3-0.7 units/ml   Plan:  Will decrease heparin infusion by ~1 unit/kg/hr to 1200 units/hr and check PTT in 6 hours.    Vernard Gambles, PharmD, BCPS  08/14/2021,3:15 AM

## 2021-08-14 NOTE — Progress Notes (Signed)
Cardiology Progress Note  Patient ID: [name] MRN: [id number] DOB: 1955/10/11 Date of Encounter: 08/14/2021  Primary Cardiologist: Carlyle Dolly, MD  Subjective   Chief Complaint: Weakness fatigue.  HPI: Negative work-up for infectious source of shock.  Appears to be hypovolemic shock.  PVCs improved on mexiletine.  ROS:  All other ROS reviewed and negative. Pertinent positives noted in the HPI.     Inpatient Medications  Scheduled Meds:  apixaban  5 mg Oral BID   aspirin  81 mg Oral Daily   Chlorhexidine Gluconate Cloth  6 each Topical Q0600   ezetimibe  10 mg Oral Daily   levETIRAcetam  250 mg Oral BID   levothyroxine  75 mcg Oral QAC breakfast   loratadine  10 mg Oral Daily   metoprolol succinate  50 mg Oral Daily   mexiletine  150 mg Oral Q8H   pantoprazole  40 mg Oral Daily   rosuvastatin  5 mg Oral Daily   Continuous Infusions:  PRN Meds: acetaminophen, albuterol, ALPRAZolam, nitroGLYCERIN, ondansetron (ZOFRAN) IV   Vital Signs   Vitals:   08/14/21 0400 08/14/21 0500 08/14/21 0600 08/14/21 0819  BP: 118/64 125/62 117/71   Pulse: 60 (!) 59 66   Resp: 13 10 15    Temp:    98.3 F (36.8 C)  TempSrc:    Oral  SpO2: 100% 100% 100%   Weight: 85.9 kg     Height:        Intake/Output Summary (Last 24 hours) at 08/14/2021 0829 Last data filed at 08/14/2021 0600 Gross per 24 hour  Intake 1623.74 ml  Output 1950 ml  Net -326.26 ml   Last 3 Weights 08/14/2021 08/13/2021 08/12/2021  Weight (lbs) 189 lb 6 oz 189 lb 9.5 oz 189 lb 9.5 oz  Weight (kg) 85.9 kg 86 kg 86 kg      Telemetry  Overnight telemetry shows sinus rhythm in the 60s, PVCs noted, which I personally reviewed.   Physical Exam   Vitals:   08/14/21 0400 08/14/21 0500 08/14/21 0600 08/14/21 0819  BP: 118/64 125/62 117/71   Pulse: 60 (!) 59 66   Resp: 13 10 15    Temp:    98.3 F (36.8 C)  TempSrc:    Oral  SpO2: 100% 100% 100%   Weight: 85.9 kg     Height:        Intake/Output  Summary (Last 24 hours) at 08/14/2021 0829 Last data filed at 08/14/2021 0600 Gross per 24 hour  Intake 1623.74 ml  Output 1950 ml  Net -326.26 ml    Last 3 Weights 08/14/2021 08/13/2021 08/12/2021  Weight (lbs) 189 lb 6 oz 189 lb 9.5 oz 189 lb 9.5 oz  Weight (kg) 85.9 kg 86 kg 86 kg    Body mass index is 33.55 kg/m.  General: Well nourished, well developed, in no acute distress Head: Atraumatic, normal size  Eyes: PEERLA, EOMI  Neck: Supple, no JVD Endocrine: No thryomegaly Cardiac: Normal S1, S2; RRR; no murmurs, rubs, or gallops Lungs: Clear to auscultation bilaterally, no wheezing, rhonchi or rales  Abd: Soft, nontender, no hepatomegaly  Ext: No edema, pulses 2+ Musculoskeletal: No deformities, BUE and BLE strength normal and equal Skin: Warm and dry, no rashes   Neuro: Alert and oriented to person, place, time, and situation, CNII-XII grossly intact, no focal deficits  Psych: Normal mood and affect   Labs  High Sensitivity Troponin:   Recent Labs  Lab 07/30/21 1337 07/30/21 1545 08/12/21 1745 08/12/21  1941 08/13/21 0109  TROPONINIHS 23* 19* 18* 17 28*     Cardiac EnzymesNo results for input(s): TROPONINI in the last 168 hours. No results for input(s): TROPIPOC in the last 168 hours.  Chemistry Recent Labs  Lab 08/12/21 1745 08/13/21 0109 08/13/21 1627  NA 133* 134* 137  K 3.9 3.0* 4.2  CL 102 104 107  CO2 20* 18* 21*  GLUCOSE 101* 140* 91  BUN 23 18 8   CREATININE 1.23* 1.04* 0.80  CALCIUM 8.7* 8.3* 8.7*  PROT  --  5.7*  --   ALBUMIN  --  3.4*  --   AST  --  16  --   ALT  --  14  --   ALKPHOS  --  45  --   BILITOT  --  0.9  --   GFRNONAA 49* 60* >60  ANIONGAP 11 12 9     Hematology Recent Labs  Lab 08/12/21 1745 08/12/21 2041 08/13/21 0109 08/14/21 0114  WBC 5.4  --  5.8 3.6*  RBC 3.29*  --  3.18* 2.96*  HGB 10.1* 10.1* 9.5* 9.0*  HCT 29.5* 29.3* 28.7* 26.8*  MCV 89.7  --  90.3 90.5  MCH 30.7  --  29.9 30.4  MCHC 34.2  --  33.1 33.6  RDW  12.8  --  12.8 13.0  PLT 314  --  323 240   BNP Recent Labs  Lab 08/12/21 1745  BNP 164.2*    DDimer No results for input(s): DDIMER in the last 168 hours.   Radiology  DG Chest Port 1 View  Result Date: 08/12/2021 CLINICAL DATA:  Chest pain and palpitations. EXAM: PORTABLE CHEST 1 VIEW COMPARISON:  07/30/2021 FINDINGS: 1752 hours. The lungs are clear without focal pneumonia, edema, pneumothorax or pleural effusion. Cardiopericardial silhouette is at upper limits of normal for size. Status post CABG. The visualized bony structures of the thorax show no acute abnormality. Telemetry leads overlie the chest. IMPRESSION: Stable.  No acute findings. Electronically Signed   By: 13/05/2021 M.D.   On: 08/12/2021 18:42   ECHOCARDIOGRAM COMPLETE  Result Date: 08/13/2021    ECHOCARDIOGRAM REPORT   Patient Name:   [name] Date of Exam: 08/13/2021 Medical Rec #:  [name]       Height:       63.0 in Accession #:    13/06/2021      Weight:       189.6 lb Date of Birth:  1956-06-24       BSA:          1.890 m Patient Age:    65 years        BP:           88/51 mmHg Patient Gender: F               HR:           62 bpm. Exam Location:  Inpatient Procedure: 2D Echo Indications:    acute systolic chf  History:        Patient has prior history of Echocardiogram examinations, most                 recent 07/22/2021. Cardiomyopathy, Prior CABG, COPD; Risk                 Factors:Hypertension and Dyslipidemia.  Sonographer:    03/21/1956 RDCS Referring Phys: 07/24/2021 Delcie Roch O'NEAL IMPRESSIONS  1. Left ventricular ejection fraction, by estimation, is 45 to 50%.  The left ventricle has mildly decreased function. The left ventricle demonstrates global hypokinesis. The left ventricular internal cavity size was mildly dilated. Left ventricular diastolic parameters are consistent with Grade II diastolic dysfunction (pseudonormalization).  2. Right ventricular systolic function is normal. The right  ventricular size is normal. Tricuspid regurgitation signal is inadequate for assessing PA pressure.  3. Left atrial size was moderately dilated.  4. The mitral valve is normal in structure. Trivial mitral valve regurgitation. No evidence of mitral stenosis.  5. The aortic valve is tricuspid. Aortic valve regurgitation is not visualized. Mild aortic valve sclerosis is present, with no evidence of aortic valve stenosis.  6. Aortic dilatation noted. There is mild dilatation of the ascending aorta, measuring 38 mm.  7. The inferior vena cava is normal in size with greater than 50% respiratory variability, suggesting right atrial pressure of 3 mmHg. FINDINGS  Left Ventricle: Left ventricular ejection fraction, by estimation, is 45 to 50%. The left ventricle has mildly decreased function. The left ventricle demonstrates global hypokinesis. The left ventricular internal cavity size was mildly dilated. There is  no left ventricular hypertrophy. Left ventricular diastolic parameters are consistent with Grade II diastolic dysfunction (pseudonormalization). Right Ventricle: The right ventricular size is normal. No increase in right ventricular wall thickness. Right ventricular systolic function is normal. Tricuspid regurgitation signal is inadequate for assessing PA pressure. Left Atrium: Left atrial size was moderately dilated. Right Atrium: Right atrial size was normal in size. Pericardium: There is no evidence of pericardial effusion. Mitral Valve: The mitral valve is normal in structure. Trivial mitral valve regurgitation. No evidence of mitral valve stenosis. Tricuspid Valve: The tricuspid valve is normal in structure. Tricuspid valve regurgitation is trivial. Aortic Valve: The aortic valve is tricuspid. Aortic valve regurgitation is not visualized. Mild aortic valve sclerosis is present, with no evidence of aortic valve stenosis. Pulmonic Valve: The pulmonic valve was normal in structure. Pulmonic valve regurgitation is  not visualized. Aorta: The aortic root is normal in size and structure and aortic dilatation noted. There is mild dilatation of the ascending aorta, measuring 38 mm. Venous: The inferior vena cava is normal in size with greater than 50% respiratory variability, suggesting right atrial pressure of 3 mmHg. IAS/Shunts: No atrial level shunt detected by color flow Doppler.  LEFT VENTRICLE PLAX 2D LVIDd:         5.86 cm      Diastology LVIDs:         4.74 cm      LV e' medial:    6.31 cm/s LV PW:         0.87 cm      LV E/e' medial:  17.0 LV IVS:        0.74 cm      LV e' lateral:   10.20 cm/s LVOT diam:     1.90 cm      LV E/e' lateral: 10.5 LV SV:         60 LV SV Index:   32 LVOT Area:     2.84 cm  LV Volumes (MOD) LV vol d, MOD A2C: 140.0 ml LV vol d, MOD A4C: 116.0 ml LV vol s, MOD A2C: 69.4 ml LV vol s, MOD A4C: 60.5 ml LV SV MOD A2C:     70.6 ml LV SV MOD A4C:     116.0 ml LV SV MOD BP:      64.4 ml RIGHT VENTRICLE RV Basal diam:  2.67 cm RV S prime:  10.40 cm/s TAPSE (M-mode): 1.6 cm LEFT ATRIUM             Index LA diam:        4.80 cm 2.54 cm/m LA Vol (A2C):   85.5 ml 45.23 ml/m LA Vol (A4C):   70.1 ml 37.09 ml/m LA Biplane Vol: 79.0 ml 41.79 ml/m  AORTIC VALVE LVOT Vmax:   105.00 cm/s LVOT Vmean:  69.800 cm/s LVOT VTI:    0.213 m  AORTA Ao Root diam: 2.80 cm Ao Asc diam:  3.80 cm MITRAL VALVE MV Area (PHT): 3.34 cm     SHUNTS MV Decel Time: 227 msec     Systemic VTI:  0.21 m MV E velocity: 107.00 cm/s  Systemic Diam: 1.90 cm MV A velocity: 67.30 cm/s MV E/A ratio:  1.59 Dalton McleanMD Electronically signed by Franki Monte Signature Date/Time: 08/13/2021/4:04:03 PM    Final     Cardiac Studies  TTE 08/13/2021  1. Left ventricular ejection fraction, by estimation, is 45 to 50%. The  left ventricle has mildly decreased function. The left ventricle  demonstrates global hypokinesis. The left ventricular internal cavity size  was mildly dilated. Left ventricular  diastolic parameters are  consistent with Grade II diastolic dysfunction  (pseudonormalization).   2. Right ventricular systolic function is normal. The right ventricular  size is normal. Tricuspid regurgitation signal is inadequate for assessing  PA pressure.   3. Left atrial size was moderately dilated.   4. The mitral valve is normal in structure. Trivial mitral valve  regurgitation. No evidence of mitral stenosis.   5. The aortic valve is tricuspid. Aortic valve regurgitation is not  visualized. Mild aortic valve sclerosis is present, with no evidence of  aortic valve stenosis.   6. Aortic dilatation noted. There is mild dilatation of the ascending  aorta, measuring 38 mm.   7. The inferior vena cava is normal in size with greater than 50%  respiratory variability, suggesting right atrial pressure of 3 mmHg.   Patient Profile  [name] is a 65 y.o. female with CAD status post CABG in 2005, ischemic cardiomyopathy, hypertension, hyperlipidemia, GERD who was admitted on 08/12/2021 for shock.  Bedside echocardiogram shows preserved EF with collapsing IVC.  She has not been eating or drinking well since her cardioversion on 07/30/2021.  Presumptive diagnosis is hypovolemic shock.  Assessment & Plan   #Shock, hypovolemic -Admitted with weakness and fatigue and hypotension in the ER with systolic blood pressure in the 70s.  She underwent cardioversion on 07/30/2021 at the [facility] emergency room. -Apparently since that time she has not been eating or drinking and has lost 20 pounds per her report. -Infectious work-up is negative.  Procalcitonin is negative.  No increased white count.  Hemoglobin appears to be stable but is trending down. -Blood cultures negative.  UA negative. -Random cortisol level was appropriate. -Troponins are negative.  BNP unremarkable.  Echocardiogram is really unchanged. -Overall everything points that she was just volume down.  She has improved with fluid resuscitation.  We will  hold further intravenous fluids. -I think her medications could have been contributing as well.  She was on a lot of blood pressure medications and given her weight loss this could easily explain her symptoms.  We will add back her heart failure medications slowly. -Physical therapy evaluation today.  Stable for transfer to floor.  #Ischemic cardiomyopathy, EF 45 to 50% #PVCs -Bedside echo was unchanged with EF 45-50%.  Shock due to hypovolemia. -Blood  pressure is much improved.  We will add back metoprolol succinate 50 mg daily. -PVCs have improved on mexiletine 150 mg 3 times daily.  We will continue this. -She was on Entresto at home.  We will restart this back likely tomorrow. -Holding Lasix.  Likely can take this as needed at discharge. -Anticipate possible discharge tomorrow pending improvement in blood pressure and symptoms.  #CAD status post CABG -Cath in 2016 with most of her vein grafts occluded.  No targets for revascularization of the native coronary tree.  She has been managed medically.  We will add back medications as we are able. -Continue aspirin, statin and Zetia.  Beta-blocker being added back today.  #A. fib status post DCCV 07/30/2021 -Was on heparin drip as we were uncertain if she would need heart catheterization.  Given improvement we will add back Eliquis 5 mg twice daily today. -Maintaining sinus rhythm.  #FEN -Hold further intravenous fluids -Diet: Heart healthy -DVT PPx: Eliquis -Code: Full -Disposition: Transfer to floor today.  Anticipate discharge likely tomorrow  For questions or updates, please contact [place] Please consult [email or web address] for contact info under   Time Spent with Patient: I have spent a total of 35 minutes with patient reviewing hospital notes, telemetry, EKGs, labs and examining the patient as well as establishing an assessment and plan that was discussed with the patient.  > 50% of time was spent in direct patient care.     Signed, Addison Naegeli. Audie Box, MD, [place]  08/14/2021 8:29 AM

## 2021-08-15 ENCOUNTER — Other Ambulatory Visit (HOSPITAL_COMMUNITY): Payer: Self-pay

## 2021-08-15 ENCOUNTER — Encounter (HOSPITAL_COMMUNITY): Payer: Self-pay | Admitting: Internal Medicine

## 2021-08-15 DIAGNOSIS — I255 Ischemic cardiomyopathy: Secondary | ICD-10-CM

## 2021-08-15 DIAGNOSIS — Z9861 Coronary angioplasty status: Secondary | ICD-10-CM

## 2021-08-15 DIAGNOSIS — Z7901 Long term (current) use of anticoagulants: Secondary | ICD-10-CM

## 2021-08-15 DIAGNOSIS — I5022 Chronic systolic (congestive) heart failure: Secondary | ICD-10-CM | POA: Diagnosis not present

## 2021-08-15 DIAGNOSIS — I498 Other specified cardiac arrhythmias: Secondary | ICD-10-CM | POA: Diagnosis not present

## 2021-08-15 DIAGNOSIS — R579 Shock, unspecified: Secondary | ICD-10-CM | POA: Diagnosis not present

## 2021-08-15 DIAGNOSIS — I4819 Other persistent atrial fibrillation: Secondary | ICD-10-CM

## 2021-08-15 DIAGNOSIS — I2510 Atherosclerotic heart disease of native coronary artery without angina pectoris: Secondary | ICD-10-CM | POA: Diagnosis not present

## 2021-08-15 DIAGNOSIS — R571 Hypovolemic shock: Secondary | ICD-10-CM | POA: Diagnosis not present

## 2021-08-15 HISTORY — DX: Other persistent atrial fibrillation: I48.19

## 2021-08-15 HISTORY — DX: Long term (current) use of anticoagulants: Z79.01

## 2021-08-15 LAB — BASIC METABOLIC PANEL
Anion gap: 3 — ABNORMAL LOW (ref 5–15)
BUN: 10 mg/dL (ref 8–23)
CO2: 28 mmol/L (ref 22–32)
Calcium: 8.8 mg/dL — ABNORMAL LOW (ref 8.9–10.3)
Chloride: 102 mmol/L (ref 98–111)
Creatinine, Ser: 0.87 mg/dL (ref 0.44–1.00)
GFR, Estimated: >60 mL/min (ref >60)
Glucose, Bld: 104 mg/dL — ABNORMAL HIGH (ref 70–99)
Potassium: 3.9 mmol/L (ref 3.5–5.1)
Sodium: 133 mmol/L — ABNORMAL LOW (ref 135–145)

## 2021-08-15 LAB — CBC
HCT: 27.3 % — ABNORMAL LOW (ref 36.0–46.0)
Hemoglobin: 8.9 g/dL — ABNORMAL LOW (ref 12.0–15.0)
MCH: 30.2 pg (ref 26.0–34.0)
MCHC: 32.6 g/dL (ref 30.0–36.0)
MCV: 92.5 fL (ref 80.0–100.0)
Platelets: 270 10*3/uL (ref 150–400)
RBC: 2.95 MIL/uL — ABNORMAL LOW (ref 3.87–5.11)
RDW: 13.2 % (ref 11.5–15.5)
WBC: 4.5 10*3/uL (ref 4.0–10.5)
nRBC: 0 % (ref 0.0–0.2)

## 2021-08-15 LAB — PROCALCITONIN: Procalcitonin: <0.1 ng/mL

## 2021-08-15 MED ORDER — NITROGLYCERIN 0.4 MG SL SUBL
SUBLINGUAL_TABLET | SUBLINGUAL | Status: AC
  Filled 2021-08-15: qty 1

## 2021-08-15 MED ORDER — MEXILETINE HCL 150 MG PO CAPS
150.0000 mg | ORAL_CAPSULE | Freq: Three times a day (TID) | ORAL | 6 refills | Status: DC
  Filled 2021-08-15: qty 90, 30d supply, fill #0

## 2021-08-15 MED ORDER — SACUBITRIL-VALSARTAN 24-26 MG PO TABS
1.0000 | ORAL_TABLET | Freq: Two times a day (BID) | ORAL | Status: DC
Start: 2021-08-15 — End: 2021-08-15
  Administered 2021-08-15: 1 via ORAL
  Filled 2021-08-15: qty 1

## 2021-08-15 MED ORDER — FUROSEMIDE 80 MG PO TABS: 80.0000 mg | ORAL_TABLET | Freq: Every day | ORAL | 3 refills | Status: AC | PRN

## 2021-08-15 MED ORDER — ASPIRIN 81 MG PO CHEW: 81.0000 mg | CHEWABLE_TABLET | Freq: Every day | ORAL | Status: DC

## 2021-08-15 MED ORDER — METOPROLOL SUCCINATE ER 25 MG PO TB24
25.0000 mg | ORAL_TABLET | Freq: Every day | ORAL | 6 refills | Status: DC
  Filled 2021-08-15: qty 30, 30d supply, fill #0

## 2021-08-15 MED ORDER — POTASSIUM CHLORIDE CRYS ER 20 MEQ PO TBCR: 20.0000 meq | EXTENDED_RELEASE_TABLET | Freq: Every day | ORAL | 3 refills | Status: AC | PRN

## 2021-08-15 NOTE — Plan of Care (Signed)
DISCHARGE NOTE HOME [name] to be discharged home per MD order. Discussed prescriptions and follow up appointments with the patient. Prescriptions given to patient; medication list explained in detail. Patient verbalized understanding.  Skin clean, dry and intact without evidence of skin break down, no evidence of skin tears noted. IV catheter discontinued intact. Site without signs and symptoms of complications. Dressing and pressure applied. Pt denies pain at the site currently. No complaints noted.  Patient free of lines, drains, and wounds.   An After Visit Summary (AVS) was printed and given to the patient. Patient escorted via wheelchair, and discharged home via private auto.  Arlice Colt, RN
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
## 2021-08-15 NOTE — Progress Notes (Signed)
Physical Therapy Treatment & Discharge Patient Details Name: [name] MRN: [id number] DOB: 02/10/1956 Today's Date: 08/15/2021   History of Present Illness The pt is a 65 yo female presenting 11/8 with chest pain, nausea, and vomiting. Of note, the pt had recent cardioversion on 10/26. Upon work-up, pt with frequent PVCs and runs of bigeminy, found to have hypovolemic shock. PMH includes: anxiety, anemia, COPD, CAD s/p CABG in 2005 and NSTEMI in 2011, diverticulitis, HTN, HLD, cardiomyopathy, and sleep apnea.    PT Comments    Pt appears to be back to her baseline. Pt is limited in gait speed and displays some deviations/limitations with gait and stair negotiation primarily due to her chronic L knee pain. Educated pt on sequencing of stairs to reduce her pain. Pt with no LOB or physical assistance needed for all functional mobility. Educated pt on managing L knee pain through exercises to tolerance to strengthen muscles around knee to support it and to perform gentle patellar glides for comfort. All education completed and questions answered. PT will sign off, pt in agreement.    Recommendations for follow up therapy are one component of a multi-disciplinary discharge planning process, led by the attending physician.  Recommendations may be updated based on patient status, additional functional criteria and insurance authorization.  Follow Up Recommendations  No PT follow up     Assistance Recommended at Discharge Intermittent Supervision/Assistance  Equipment Recommendations  None recommended by PT    Recommendations for Other Services       Precautions / Restrictions Precautions Precautions: None Restrictions Weight Bearing Restrictions: No     Mobility  Bed Mobility               General bed mobility comments: pt OOB in recliner at start and end of session    Transfers Overall transfer level: Independent Equipment used: None Transfers: Sit to/from Stand Sit  to Stand: Independent           General transfer comment: No assistance or LOB noted.    Ambulation/Gait Ambulation/Gait assistance: Supervision Gait Distance (Feet): 350 Feet Assistive device: None Gait Pattern/deviations: Antalgic Gait velocity: reduced Gait velocity interpretation: <1.31 ft/sec, indicative of household ambulator   General Gait Details: Slightly slowed gait speed with mild antalgic gait pattern, especially after negotiating stairs, due to L knee pain (chronic), no LOB.   Stairs Stairs: Yes Stairs assistance: Min guard Stair Management: One rail Right;One rail Left;Step to pattern;Forwards Number of Stairs: 10 General stair comments: Ascends leading with L leg and descends leading with R leg, cued to lead down with L due to L knee pain but pt found more comfort the other way due to difficulty accepting the weight stepping down with the L. No LOB.   Wheelchair Mobility    Modified Rankin (Stroke Patients Only)       Balance Overall balance assessment: Mild deficits observed, not formally tested                                          Cognition Arousal/Alertness: Awake/alert Behavior During Therapy: WFL for tasks assessed/performed Overall Cognitive Status: Within Functional Limits for tasks assessed  Exercises      General Comments General comments (skin integrity, edema, etc.): Educated pt via verbal and demonstration to perform exercises to tolerance, like squats, heel slides, LAQ, and heel raises, to improve muscular strength and support of knee joint to manage pain in addition to gentle patellar glides, pt verbalized understanding      Pertinent Vitals/Pain Pain Assessment: Faces Faces Pain Scale: Hurts a little bit Pain Location: L knee Pain Descriptors / Indicators: Discomfort;Guarding Pain Intervention(s): Limited activity within patient's tolerance;Monitored  during session;Repositioned    Home Living                          Prior Function            PT Goals (current goals can now be found in the care plan section) Acute Rehab PT Goals Patient Stated Goal: to improve her L knee pain PT Goal Formulation: All assessment and education complete, DC therapy Time For Goal Achievement: 08/28/21 Potential to Achieve Goals: Good Progress towards PT goals: Goals met/education completed, patient discharged from PT    Frequency    Min 3X/week      PT Plan Current plan remains appropriate    Co-evaluation              AM-PAC PT "6 Clicks" Mobility   Outcome Measure  Help needed turning from your back to your side while in a flat bed without using bedrails?: None Help needed moving from lying on your back to sitting on the side of a flat bed without using bedrails?: None Help needed moving to and from a bed to a chair (including a wheelchair)?: None Help needed standing up from a chair using your arms (e.g., wheelchair or bedside chair)?: None Help needed to walk in hospital room?: A Little Help needed climbing 3-5 steps with a railing? : A Little 6 Click Score: 22    End of Session   Activity Tolerance: Patient tolerated treatment well Patient left: in chair;with call bell/phone within reach   PT Visit Diagnosis: Other abnormalities of gait and mobility (R26.89)     Time: 4193-7902 PT Time Calculation (min) (ACUTE ONLY): 13 min  Charges:  $Gait Training: 8-22 mins                     Moishe Spice, PT, DPT Acute Rehabilitation Services  Pager: [phone] Office: [place] 08/15/2021, 12:05 PM

## 2021-08-15 NOTE — TOC Benefit Eligibility Note (Signed)
Patient Product/process development scientist completed.    The patient is currently admitted and upon discharge could be taking Mexiletine 150 mg.  The current 30 day co-pay is, $1.35.   The patient is currently admitted and upon discharge could be taking Farxiga 10 mg.  The current 30 day co-pay is, $4.00.   The patient is currently admitted and upon discharge could be taking Jardiance 10mg .  The current 30 day co-pay is, $4.00.   The patient is insured through [employer] Medicare Part D     , CPhT Pharmacy Patient Advocate Specialist [facility] Patient Advocate Team Direct Number: [phone]  Fax: [phone]

## 2021-08-15 NOTE — Discharge Instructions (Addendum)
Heart Healthy Diet  If you have any problems please call Dr. Verna Czech office.  We adjusted your meds.  Mexilitine is every 8 hours to keep premature beats (PVCs) away  Hold imdur for now  you should not be on lotensin since you are on entresto.  Furosemide (lasix) only take if needed for swelling and same for potassium only take if you take the furosemide.   We decreased your dose of metoprolol, don't throw away old, just put up, you may need to go to higher doses in future.     See primary doctor in near future to check blood count.  If you can check your BP daily for a week that would be great then maybe twice a week.

## 2021-08-17 LAB — CULTURE, BLOOD (ROUTINE X 2)
Culture: NO GROWTH
Culture: NO GROWTH
Special Requests: ADEQUATE

## 2021-08-18 ENCOUNTER — Telehealth: Payer: Self-pay | Admitting: Cardiology

## 2021-08-18 ENCOUNTER — Other Ambulatory Visit (HOSPITAL_COMMUNITY): Payer: Self-pay

## 2021-08-18 MED ORDER — HYDROXYZINE HCL 25 MG PO TABS: 25.0000 mg | ORAL_TABLET | Freq: Four times a day (QID) | ORAL | 0 refills | Status: AC | PRN

## 2021-08-18 NOTE — Telephone Encounter (Signed)
*  STAT* If patient is at the pharmacy, call can be transferred to refill team.   1. Which medications need to be refilled? (please list name of each medication and dose if known) hydrOXYzine (ATARAX/VISTARIL) 25 MG tablet  2. Which pharmacy/location (including street and city if local pharmacy) is medication to be sent to? [facility] - [place], [place] - [address]  3. Do they need a 30 day or 90 day supply? 90 day   Patient is out of medication. She states it was not received by the pharmacy

## 2021-08-18 NOTE — Telephone Encounter (Signed)
Per Dr.Branch "can do one refill disp 30 tablets.  please make clear our clinic policy is to manage only heart medications and this would be a one time exemption, needs to set up with a pcp. If significant anxiety issues we can offer a referal to behavioral health."     I spoke with patient and explained we would refill her medication one time only. She states she is in the process of getting a pcp

## 2021-08-25 ENCOUNTER — Ambulatory Visit (HOSPITAL_COMMUNITY): Payer: Medicare Other

## 2021-09-04 ENCOUNTER — Ambulatory Visit: Payer: Medicare Other | Admitting: Cardiology

## 2021-09-04 NOTE — Progress Notes (Deleted)
Clinical Summary [name] is a 65 y.o.femaleseen today for follow up of the following medical problems.      1. CAD   - CABG 2005 4 vessel    cath 05/2015 results as reported below, no targets for pci, recs for medical management. Was started on ranexa 586m bid, had headache on nitrates but recently on lower dose.   - headaches on ranexa.      06/2018 echo LVEF 55-60% 06/2018 nuclear stress without ischemia   -  Remains on effient. Looking back had been on long time even before I started seeing her in 2014, I believe her prior cardiologist maintained her on DAPT due to her extensive cardiac revasc history. Has tolerated several years     07/2021 echo LVEF 40-45%, normal RV   - no edema.DOE with activities, fatigue      - entresto causes shakes, difficultly rest, nausea, dizziness - presented 08/12/21 with chest pain    2.Hypotension - admitted 08/2021 with low bp's, fatigue due to poor oral intake - infectious workup negative, normal random cortisol -     2. Carotid stenosis   - Carotid [place] 40-59% bilateral [id number] 480-88% LICA 411-03%  08/2020 carotid [place] right 415-94% LICA 758-59%- no recent symptoms   3. HTN   - she is compliant with meds     4. Hyperlipidemia   - did not tolerate crestor, prava, or lovastatin due to side effects.   - remains on zetia   - labs followed by pcp, we do not have the most recent results     5. Afib - new diagnosis 06/16/2021 - on eliquis - ongoing palpitations, fatigue, dizziness   -was planned for outpatient cardioversion however presented to ER with symptoms, was cardioverted in ER 07/30/21.  6. PVCs - started on mexilitene by Dr OMarisue Ivanduring 08/13/2021 admission  Past Medical History:  Diagnosis Date   Acute renal failure ([place]    Anemia    Anticoagulation adequate 08/15/2021   Anxiety    Arthritis    Asthma    Carotid artery disease ([facility]    a. Duplex 50-69% BICA 03/2014.   Chronic back pain     Collagen vascular disease (HCC)    COPD (chronic obstructive pulmonary disease) (HCC)    Coronary artery disease    a. s/p prior CABG 2005. b. BMS 02/2009 to VG-PDA. c. NSTEMI 08/2010 s/p PTCA/DES to SVG-PDA in previously stented area. d. DES to SVG to RCA on 05/24/14  c. LHC 06/04/2015 w/ no sig change in her anatomy and Rx managment   DDD (degenerative disc disease)    Depression    Diverticulitis    4 documented episodes by CT; most recent April 2012   DIVERTICULITIS OF COLON 07/14/2010   Qualifier: Diagnosis of  By: LWestly Pam    GERD (gastroesophageal reflux disease)    History of blood transfusion    HTN (hypertension)    Hyperlipidemia    Hypertension    Hypothyroidism    Ischemic cardiomyopathy    a. EF 40% in 2012 by cath. b. improved to 50-55% by cath 05/2014  [facility]   Kidney stones    Persistent atrial fibrillation ([place] with DCCV 07/30/21 maintaining SR 08/15/2021   Seizures ([place]    Shock ([place] hypovolemic 08/13/2021   Sinus bradycardia    Sleep apnea    a. Mild-to-moderate obstructive sleep apnea diagnosed in April 2012 by sleep  study.   Tension headache      Allergies  Allergen Reactions   Pneumococcal Vaccines Anxiety, Palpitations and Cough   Adhesive [Tape] Itching and Other (See Comments)    Blisters (pls use paper tape)   Crestor [Rosuvastatin] Other (See Comments)    Muscle pain   Lipitor [Atorvastatin] Other (See Comments)    Cramps   Morphine And Related Itching    Pt states she takes with benadryl     Current Outpatient Medications  Medication Sig Dispense Refill   acetaminophen (TYLENOL) 650 MG CR tablet Take 1,300 mg by mouth every 8 (eight) hours as needed for pain.     albuterol (VENTOLIN HFA) 108 (90 Base) MCG/ACT inhaler Inhale 2 puffs into the lungs every 6 (six) hours as needed for wheezing or shortness of breath.     apixaban (ELIQUIS) 5 MG TABS tablet Take 1 tablet (5 mg total) by mouth 2 (two) times daily. 60  tablet 6   aspirin 81 MG chewable tablet Chew 1 tablet (81 mg total) by mouth daily.     cetirizine (ZYRTEC ALLERGY) 10 MG tablet Take 1 tablet (10 mg total) by mouth daily. 30 tablet 0   ezetimibe (ZETIA) 10 MG tablet TAKE (1) TABLET BY MOUTH ONCE DAILY. (Patient taking differently: Take 10 mg by mouth daily.) 90 tablet 3   furosemide (LASIX) 80 MG tablet Take 1 tablet (80 mg total) by mouth daily as needed for edema (we changed to if needed because you were dehydrated). 90 tablet 3   hydrOXYzine (ATARAX/VISTARIL) 25 MG tablet Take 1 tablet (25 mg total) by mouth every 6 (six) hours as needed for anxiety. 30 tablet 0   levETIRAcetam (KEPPRA) 250 MG tablet TAKE (1) TABLET BY MOUTH TWICE DAILY. (Patient taking differently: Take 250 mg by mouth 2 (two) times daily.) 180 tablet 0   levothyroxine (SYNTHROID) 75 MCG tablet Take 1 tablet (75 mcg total) by mouth daily before breakfast. 90 tablet 3   metoprolol succinate (TOPROL-XL) 25 MG 24 hr tablet Take 1 tablet (25 mg total) by mouth daily. 30 tablet 6   mexiletine (MEXITIL) 150 MG capsule Take 1 capsule (150 mg total) by mouth every 8 (eight) hours. 90 capsule 6   Multiple Vitamin (MULTIVITAMIN WITH MINERALS) TABS tablet Take 1 tablet by mouth 2 (two) times daily. (Patient taking differently: Take 1 tablet by mouth daily.) 25 tablet 4   nitroGLYCERIN (NITROSTAT) 0.4 MG SL tablet DISSOLVE 1 TABLET UNDER TONGUE EVERY 5 MINUTES UP TO 15 MIN FOR CHESTPAIN. IF NO RELIEF CALL 911. 25 tablet 3   Omega 3 1000 MG CAPS Take 1 capsule (1,000 mg total) by mouth 2 (two) times daily.     omeprazole (PRILOSEC) 20 MG capsule TAKE (1) CAPSULE BY MOUTH ONCE DAILY. 90 capsule 0   potassium chloride SA (KLOR-CON) 20 MEQ tablet Take 1 tablet (20 mEq total) by mouth daily as needed. 90 tablet 3   rosuvastatin (CRESTOR) 5 MG tablet Take 1 tablet (5 mg total) by mouth daily. 90 tablet 1   sacubitril-valsartan (ENTRESTO) 24-26 MG Take 1 tablet by mouth 2 (two) times daily.      No current facility-administered medications for this visit.     Past Surgical History:  Procedure Laterality Date   APPENDECTOMY  ~ 1970   CARDIAC CATHETERIZATION  Jan 2012   patent stents in RCA graft; CAD stable   CARDIAC CATHETERIZATION N/A 06/03/2015   Procedure: Left Heart Cath and Cors/Grafts Angiography;  Surgeon: Mallie Mussel  Nicholes Stairs, MD;  Location: [facility];  Service: Cardiovascular;  Laterality: N/A;   CARPAL TUNNEL WITH CUBITAL TUNNEL Right 2001   CORONARY ANGIOPLASTY WITH STENT PLACEMENT  2011   Promus Premier DES, 3.5 mm x 12 mm, for 99% proximal stent ISR in the SVG-RPDA    CORONARY ANGIOPLASTY WITH STENT PLACEMENT  05/24/2014   CORONARY ARTERY BYPASS GRAFT  2005   LIMA to AL, SVG to LAD, SVG to Cx, SVG-PDA   KNEE ARTHROSCOPY W/ ACL RECONSTRUCTION Left 2001   LAPAROSCOPIC CHOLECYSTECTOMY  2010   LEFT HEART CATHETERIZATION WITH CORONARY/GRAFT ANGIOGRAM N/A 05/24/2014   Procedure: LEFT HEART CATHETERIZATION WITH Beatrix Fetters;  Surgeon: Leonie Man, MD; EF 50-55%, LAD & D1 100%, LIMA-D1 OK, CFX OK, OM1 & OM2 100%, lat OM branch 100%, RCA 100%; pSVG-RPDA 99% rx w/ scoring balloon PTCA & 3.5 x 12 mm Promus DES; SVG-LAD, SVG-OM known occluded       TUBAL LIGATION  ~ [place]  ~ 1993     Allergies  Allergen Reactions   Pneumococcal Vaccines Anxiety, Palpitations and Cough   Adhesive [Tape] Itching and Other (See Comments)    Blisters (pls use paper tape)   Crestor [Rosuvastatin] Other (See Comments)    Muscle pain   Lipitor [Atorvastatin] Other (See Comments)    Cramps   Morphine And Related Itching    Pt states she takes with benadryl      Family History  Problem Relation Age of Onset   Heart attack Mother    Heart disease Mother    Breast cancer Mother    Heart attack Father    Heart disease Father    Colon cancer Neg Hx      Social History Ms. Knippenberg reports that she quit smoking about 6 years ago. Her smoking use  included cigarettes. She started smoking about 28 years ago. She has a 10.50 pack-year smoking history. She has never used smokeless tobacco. Ms. Everly reports no history of alcohol use.   Review of Systems CONSTITUTIONAL: No weight loss, fever, chills, weakness or fatigue.  HEENT: Eyes: No visual loss, blurred vision, double vision or yellow sclerae.No hearing loss, sneezing, congestion, runny nose or sore throat.  SKIN: No rash or itching.  CARDIOVASCULAR:  RESPIRATORY: No shortness of breath, cough or sputum.  GASTROINTESTINAL: No anorexia, nausea, vomiting or diarrhea. No abdominal pain or blood.  GENITOURINARY: No burning on urination, no polyuria NEUROLOGICAL: No headache, dizziness, syncope, paralysis, ataxia, numbness or tingling in the extremities. No change in bowel or bladder control.  MUSCULOSKELETAL: No muscle, back pain, joint pain or stiffness.  LYMPHATICS: No enlarged nodes. No history of splenectomy.  PSYCHIATRIC: No history of depression or anxiety.  ENDOCRINOLOGIC: No reports of sweating, cold or heat intolerance. No polyuria or polydipsia.  Marland Kitchen   Physical Examination There were no vitals filed for this visit. There were no vitals filed for this visit.  Gen: resting comfortably, no acute distress HEENT: no scleral icterus, pupils equal round and reactive, no palptable cervical adenopathy,  CV Resp: Clear to auscultation bilaterally GI: abdomen is soft, non-tender, non-distended, normal bowel sounds, no hepatosplenomegaly MSK: extremities are warm, no edema.  Skin: warm, no rash Neuro:  no focal deficits Psych: appropriate affect   Diagnostic Studies Cath 2012   Left main coronary was normal.   Left anterior descending artery was occluded just after the septal   perforator. First diagonal branch was subtotally occluded. There was  some filling of the distal LAD and diagonals from collateral branches   from the LIMA. Circumflex coronary artery was occluded in  the   midvessel. The AV groove branch was patent. There was a small first   obtuse marginal branch with 30-40% multiple lesions. There was a small   second obtuse marginal branch with 30-40% discrete lesions. There is a   very large obtuse marginal branch seen filling from collaterals from the   left internal mammary artery.   Right coronary artery was 100% occluded proximally with both left-to-   right and right-to-right collaterals. The distal PDA also filled from   the vein graft to the right coronary artery.   The left internal mammary artery was widely patent. It also filled the   distal right large obtuse marginal branch and filled retrograde to a   smaller diagonal branch.   There was known occlusion of the 2 left-sided vein grafts. The   saphenous vein graft to the right coronary artery was widely patent.   Bare-metal stent in the mid-to-distal vessel widely patent. There was   poor runoff to the vessel primarily being retrograde into the posterior   lateral branch, however, there was no new focal stenosis.   RAO ventriculography. RAO ventriculography showed anterior apical and   mid inferior wall hypokinesis. EF was in the 40% range.   Right heart catheterization showed mean pulmonary capillary wedge   pressure of 24, PA pressure was 55/26, RV pressure was 52/5, RA pressure   mean was 16, aortic pressure was 155/76, LV pressure was equal to 167/16   with a post A-wave EDP of 26.   Cardiac output was 6.3 liters per minute with a cardiac index of 3   liters per minute per meter squared by Fick.    IMPRESSION: The patient's coronary artery disease is stable. The   stents in the RCA graft were patent. There are no new lesions that I   can see. She does have collateralized distal right and OM branch.   Continued medical therapy is warranted. If she continues to have chest   pain, Ranexa may be in order since it is somewhat late in the day and   she had both venous and arterial  sheath. She will be kept overnight and   discharged in the morning. She tolerated the procedure well.     03/2014 Echo   Study Conclusions  - Left ventricle: The cavity size was normal. Wall thickness was increased in a pattern of mild LVH. Systolic function was normal. The estimated ejection fraction was in the range of 50% to 55%. There is akinesis of the basalinferolateral myocardium. Features are consistent with a pseudonormal left ventricular filling pattern, with concomitant abnormal relaxation and increased filling pressure (grade 2 diastolic dysfunction). - Aortic valve: Mildly calcified annulus. Trileaflet. There was no significant regurgitation. - Mitral valve: There was trivial regurgitation. - Left atrium: The atrium was mildly to moderately dilated. - Right ventricle: Systolic function was mildly reduced. - Right atrium: Central venous pressure (est): 3 mm Hg. - Atrial septum: No defect or patent foramen ovale was identified. - Tricuspid valve: There was trivial regurgitation. - Pulmonary arteries: Systolic pressure could not be accurately estimated. - Pericardium, extracardiac: There was no pericardial effusion.  Impressions:  - Mild LVH with LVEF 50-55%, basal inferolateral hypokinesis, grade 2 diastolic dysfunction. Mild to moderate left atrial enlargement. Mildly reduced RV contraction. Unable to assess PASP.  03/2014 Carotid US   IMPRESSION:  Bilateral 50-69% stenosis. The left internal carotid peak systolic   velocity has increased slightly in the interval from the prior exam.   05/2014 Cath FINDINGS:   Hemodynamics:   Central Aortic Pressure / Mean: 110/62/81 mmHg Left Ventricular Pressure / LVEDP: 110/14/21 mmHg   Left Ventriculography: EF: 50 at 55 % Wall Motion: Severe hypokinesis of the Basal to MID inferior-inferolateraL wall with mild hypokinesis of the basal anterior wall   Coronary Anatomy: Dominance: Right Left Main: Normal caliber vessel  that itself is free of significant disease. Bifurcates into the LAD and Circumflex LAD: Moderate caliber vessel that is 100% occluded after a septal perforator and small diagonal branch. There is a first diagonal branch that is also half percent occluded after initial subtotal occlusion.   Left Circumflex: Moderate caliber vessel it gives rise to a proximal small caliber OM branch that is somewhat tortuous and free of significant disease. The vessel then bifurcates into the introducer complex which is small in caliber and perfuses the basal inferolateral wall. There is a major lateral OM branch the visualized the proximal segment is occluded. There is late retrograde filling of the remainder the nature OM which appears to have 2 branches distally. This is faint collaterals from the proximal OM.    RCA: 100% proximal occluded. SVG-RPDA: Large-caliber graft with mildly irregular is proximally, there are overlapping stents in the distal mid graft with 99% napkin ring proximal in-stent restenosis of the most proximal stent that extends just minimally to the unstented segment. The remainder of the graft is relatively free of disease and attached to the mid RPDA. RPDA: Large-caliber tortuous vessel that reaches almost to the apex. RPL Sysytem:The RPAV fills via retrograde flow in the RPDA. There are several small posterolateral branches that are relatively free of disease. The PAV is relatively free of disease.   After reviewing the initial angiography, the culprit lesion was thought to be the 99% proximal stent ISR in the SVG-RPDA.  Preparation were made to proceed with PCI on this lesion.  As the lesion is focal & appears to be fibrous in nature and too stenosed to safely pass a filter wire, the decision was made to pre-dilate & stent without distal protection.  No evidence of a filling defect was noted besides the "napkin-ring" lesion.   Percutaneous Coronary Intervention:  Sheath exchanged for 6 Fr Guide:  6 Fr   AL1        Guidewire: BMW (after failed attempt with Pro-Water Predilation Balloon: Angioscult Cutting Balloon 3.5 mm x 10 mm;   12 Atm x 45 Sec,-- excellent predilation of the fibrous lesion at high pressure. Brisk TIMI-3 flow down stream but no sign of no reflow. The decision was made to proceed with stenting. Stent: Promus Premier DES 3.5 mm x 12 mm; overlaps roughly 4 mm into the original stent. Deployed at: 20 Atm x 30 Sec Post-dilation Balloon: Herndon Emerge 4.0 mm x 8 mm;   14 Atm x 30 Sec x 2 -   Final Diameter: 4.1 mm   Post deployment angiography in multiple views, with and without guidewire in place revealed excellent stent deployment and lesion coverage.  There was no evidence of dissection or perforation. Brisk TIMI-3 flow is noted in the downstream vessel   MEDICATIONS: Anesthesia:  Local Lidocaine 2 ml Sedation:  4 mg IV Versed, 100 mcg IV fentanyl ;   Omnipaque Contrast: 180 ml Anticoagulation:  IV Heparin 13500 Units total   Anti-Platelet Agent:  Effient and aspirin  as standing home medication   PATIENT DISPOSITION:   The patient was transferred to the PACU holding area in a hemodynamicaly stable, chest pain free condition. The patient tolerated the procedure well, and there were no complications.  EBL:   < 5 ml The patient was stable before, during, and after the procedure.   POST-OPERATIVE DIAGNOSIS:   Severe native CAD with 100% negative RCA, LAD & D1 along with OM 1 and OM 2 as previously described Severe proximal edge in-stent restenosis of SVG-RPDA   Successful PCI of the proximal edge in-stent restenosis of SVG RCA with scoring balloon angioplasty followed by DES stent placement: Promus Premier 3.5 mm x 12 mm postdilated to 4.1 mm Widely patent LIMA-diagonal with distal collaterals to what appears to be the apical LAD. Known occlusion of the SVG-LAD and SVG-OM Relatively preserved LVEF as noted on the cardiogram recently. There is known hypokinesis to akinesis  of the nasal to mid inferior inferolateral wall as well as mild hypokinesis of the basal anterior wall assistant with known CAD.   PLAN OF CARE: Overnight observation post-PCI. Continue home doses of aspirin plus Effient. Standard post radial cath care with care being removal   Discharge in the morning if stable. Followup with Dr. Harl Bowie - continue to optimize medical management of existing severe CAD.      05/2015 Cath                                                             Dominance: Co-dominant                             Left Anterior Descending                Mid LAD lesion, 100% stenosed.                First Diagonal Branch           The vessel is small in size.                1st Diag lesion, 99% stenosed.                          Ramus Intermedius      The vessel is small .                               Left Circumflex      The vessel is small .                Mid Cx to Dist Cx lesion, 100% stenosed.                Second Obtuse Marginal Branch           The vessel is small in size.                Third Obtuse Marginal Branch           3rd Mrg filled by collaterals from 1st Mrg.  Right Coronary Artery                Prox RCA to Mid RCA lesion, 100% stenosed.                Dist RCA lesion, 100% stenosed.                               Graft Angiography                      Free Graft to Dist LAD      SVG                Prox Graft to Mid Graft lesion, 100% stenosed.                         Free Graft to Dist RCA      SVG                Mid Graft lesion, 30% stenosed. The lesion was previously treated with a stent (unknown type) .                         Free Graft to 2nd Mrg      SVG                Origin to Prox Graft lesion, 100% stenosed.                  Free LIMA Graft to 2nd Diag      LIMA                      Cath RECOMMENDATIONS:    Continued aggressive risk factor modification No anatomy amenable to  percutaneous intervention.          09/2017 carotid US    Final Interpretation: Right Carotid: There is evidence in the right ICA of a 40-59% stenosis. Stable                40-59% RICA stenosis.  Left Carotid: There is evidence in the left ICA of a 40-59% stenosis. Stable               37-34% LICA stenosis.  Vertebrals:  Both vertebral arteries were patent with antegrade flow. Subclavians: Normal flow hemodynamics were seen in bilateral subclavian              arteries.     06/2018 nuclear stress   Moderate inferior (base, mid, distal), inferolateral (base, mid) defect consistent with scar and possible soft tissue attenuation No significant ischemia LVEF 33% High risk scan due to scar and depressed LVEF   06/2018 echo Study Conclusions   - Left ventricle: The cavity size was mildly dilated. Wall   thickness was normal. Systolic function was normal. The estimated   ejection fraction was in the range of 55% to 60%. - Mitral valve: There was mild regurgitation. - Left atrium: The atrium was mildly dilated. - Pulmonary arteries: PA peak pressure: 32 mm Hg (S).     08/2020 nuclear stress [facility] 1. Exam positive for moderate size area of mild reversibility  involving the anterolateral wall. Fixed defect involving the  inferolateral wall and inferior wall compatible with scar.   2. Diffusely abnormal left ventricular wall motion with inferior  septal and inferior wall hypokinesis and inferolateral wall  hypokinesis and apical akinesis..   3. Left ventricular ejection fraction 31%   4. Non invasive risk stratification*: High      08/2020 echo Summary    1. The left ventricle is normal in size with upper normal wall thickness.    2. The left ventricular systolic function is overall normal, LVEF is  visually estimated at 55%.    3. There is grade II diastolic dysfunction (elevated filling pressure).    4. The left atrium is moderately dilated in size.    5. The right  ventricle is normal in size, with normal systolic function.    6. The right atrium is mildly dilated  in size.      07/2021 echo IMPRESSIONS     1. Left ventricular ejection fraction, by estimation, is 40 to 45%. The  left ventricle has mildly decreased function. The left ventricle  demonstrates regional wall motion abnormalities (see scoring  diagram/findings for description). There is mild left  ventricular hypertrophy. Left ventricular diastolic parameters are  indeterminate.   2. Right ventricular systolic function is normal. The right ventricular  size is normal. There is normal pulmonary artery systolic pressure. The  estimated right ventricular systolic pressure is 33.0 mmHg.   3. The mitral valve is grossly normal. Trivial mitral valve  regurgitation.   4. The aortic valve is tricuspid. Aortic valve regurgitation is not  visualized. Mild aortic valve sclerosis is present, with no evidence of  aortic valve stenosis.   5. The inferior vena cava is normal in size with greater than 50%  respiratory variability, suggesting right atrial pressure of 3 mmHg.      Assessment and Plan   1. CAD /ICM - antianginal therapy limited. She is on max dose norvasc. Lower heart rates, would not increase beta blocker. Imdur may be contributing to headaches, lower dose to 20m daily. Prior headaches on ranexa -  06/2018 stress test without significant ischemia   -no recent symptoms - d/c benazepril, wait 48 hrs start entresto 24/237mbid - stop aspirin since on eliquis, should be off effient as well     2. Carotid stenosis   - moderate by last [place]-continue to monitor   3. HTN   - continue current meds   4. Afib - relatively new diagnosis, remaisn symptomatic despite rate control - plan for outpatient cardioversion       JoArnoldo LenisM.D., F.A.C.C.

## 2021-09-17 ENCOUNTER — Other Ambulatory Visit: Payer: Self-pay | Admitting: Cardiology

## 2021-09-18 ENCOUNTER — Ambulatory Visit: Payer: Medicare Other | Admitting: Cardiology

## 2021-09-18 NOTE — Progress Notes (Deleted)
Clinical Summary [name] is a 65 y.o.female seen today for follow up of the following medical problems.      1. CAD   - CABG 2005 4 vessel    cath 05/2015 results as reported below, no targets for pci, recs for medical management. Was started on ranexa 570m bid, had headache on nitrates but recently on lower dose.   - headaches on ranexa.      06/2018 echo LVEF 55-60% 06/2018 nuclear stress without ischemia   -  Remains on effient. Looking back had been on long time even before I started seeing her in 2014, I believe her prior cardiologist maintained her on DAPT due to her extensive cardiac revasc history. Has tolerated several years     07/2021 echo LVEF 40-45%, normal RV   - no edema.DOE with activities, fatigue    - entresto causes shakes, difficultly rest, nausea, dizziness - presented 08/12/21 with chest pain    2.Hypotension - admitted 08/2021 with low bp's, fatigue due to poor oral intake and hypovolemia - transiently on levophed, received IVFs - infectious workup negative, normal random cortisol -      2. Carotid stenosis   - Carotid [place] 40-59% bilateral [id number] 494-80% LICA 416-55%  08/2020 carotid [place] right 437-48% LICA 727-07%- no recent symptoms   3. HTN   - she is compliant with meds     4. Hyperlipidemia   - did not tolerate crestor, prava, or lovastatin due to side effects.   - remains on zetia   - labs followed by pcp, we do not have the most recent results     5. Afib - new diagnosis 06/16/2021 - on eliquis - ongoing palpitations, fatigue, dizziness    -was planned for outpatient cardioversion however presented to ER with symptoms, was cardioverted in ER 07/30/21.   6. PVCs - started on mexilitene by Dr OMarisue Ivanduring 08/13/2021 admission   Past Medical History:  Diagnosis Date   Acute renal failure ([place]    Anemia    Anticoagulation adequate 08/15/2021   Anxiety    Arthritis    Asthma    Carotid artery disease ([place]     a. Duplex 50-69% BICA 03/2014.   Chronic back pain    Collagen vascular disease (HCC)    COPD (chronic obstructive pulmonary disease) (HCC)    Coronary artery disease    a. s/p prior CABG 2005. b. BMS 02/2009 to VG-PDA. c. NSTEMI 08/2010 s/p PTCA/DES to SVG-PDA in previously stented area. d. DES to SVG to RCA on 05/24/14  c. LHC 06/04/2015 w/ no sig change in her anatomy and Rx managment   DDD (degenerative disc disease)    Depression    Diverticulitis    4 documented episodes by CT; most recent April 2012   DIVERTICULITIS OF COLON 07/14/2010   Qualifier: Diagnosis of  By: LWestly Pam    GERD (gastroesophageal reflux disease)    History of blood transfusion    HTN (hypertension)    Hyperlipidemia    Hypertension    Hypothyroidism    Ischemic cardiomyopathy    a. EF 40% in 2012 by cath. b. improved to 50-55% by cath 05/2014  [facility]   Kidney stones    [place]istent atrial fibrillation ([place] 07/30/21 maintaining SR 08/15/2021   Seizures ([place]    Shock (HE[place] hypovolemic 08/13/2021   Sinus bradycardia    Sleep apnea  a. Mild-to-moderate obstructive sleep apnea diagnosed in April 2012 by sleep study.   Tension headache      Allergies  Allergen Reactions   Pneumococcal Vaccines Anxiety, Palpitations and Cough   Adhesive [Tape] Itching and Other (See Comments)    Blisters (pls use paper tape)   Crestor [Rosuvastatin] Other (See Comments)    Muscle pain   Lipitor [Atorvastatin] Other (See Comments)    Cramps   Morphine And Related Itching    Pt states she takes with benadryl     Current Outpatient Medications  Medication Sig Dispense Refill   acetaminophen (TYLENOL) 650 MG CR tablet Take 1,300 mg by mouth every 8 (eight) hours as needed for pain.     albuterol (VENTOLIN HFA) 108 (90 Base) MCG/ACT inhaler Inhale 2 puffs into the lungs every 6 (six) hours as needed for wheezing or shortness of breath.     apixaban (ELIQUIS) 5 MG TABS tablet  Take 1 tablet (5 mg total) by mouth 2 (two) times daily. 60 tablet 6   aspirin 81 MG chewable tablet Chew 1 tablet (81 mg total) by mouth daily.     cetirizine (ZYRTEC ALLERGY) 10 MG tablet Take 1 tablet (10 mg total) by mouth daily. 30 tablet 0   ezetimibe (ZETIA) 10 MG tablet TAKE (1) TABLET BY MOUTH ONCE DAILY. (Patient taking differently: Take 10 mg by mouth daily.) 90 tablet 3   furosemide (LASIX) 80 MG tablet Take 1 tablet (80 mg total) by mouth daily as needed for edema (we changed to if needed because you were dehydrated). 90 tablet 3   hydrOXYzine (ATARAX/VISTARIL) 25 MG tablet Take 1 tablet (25 mg total) by mouth every 6 (six) hours as needed for anxiety. 30 tablet 0   levETIRAcetam (KEPPRA) 250 MG tablet TAKE (1) TABLET BY MOUTH TWICE DAILY. (Patient taking differently: Take 250 mg by mouth 2 (two) times daily.) 180 tablet 0   levothyroxine (SYNTHROID) 75 MCG tablet Take 1 tablet (75 mcg total) by mouth daily before breakfast. 90 tablet 3   metoprolol succinate (TOPROL-XL) 25 MG 24 hr tablet Take 1 tablet (25 mg total) by mouth daily. 30 tablet 6   metoprolol succinate (TOPROL-XL) 50 MG 24 hr tablet TAKE (1) TABLET BY MOUTH ONCE DAILY. TAKE WITH OR IMMEDIATELY FOLLOWING A MEAL. 90 tablet 1   mexiletine (MEXITIL) 150 MG capsule Take 1 capsule (150 mg total) by mouth every 8 (eight) hours. 90 capsule 6   Multiple Vitamin (MULTIVITAMIN WITH MINERALS) TABS tablet Take 1 tablet by mouth 2 (two) times daily. (Patient taking differently: Take 1 tablet by mouth daily.) 25 tablet 4   nitroGLYCERIN (NITROSTAT) 0.4 MG SL tablet DISSOLVE 1 TABLET UNDER TONGUE EVERY 5 MINUTES UP TO 15 MIN FOR CHESTPAIN. IF NO RELIEF CALL 911. 25 tablet 3   Omega 3 1000 MG CAPS Take 1 capsule (1,000 mg total) by mouth 2 (two) times daily.     omeprazole (PRILOSEC) 20 MG capsule TAKE (1) CAPSULE BY MOUTH ONCE DAILY. 90 capsule 0   potassium chloride SA (KLOR-CON) 20 MEQ tablet Take 1 tablet (20 mEq total) by mouth daily  as needed. 90 tablet 3   rosuvastatin (CRESTOR) 5 MG tablet Take 1 tablet (5 mg total) by mouth daily. 90 tablet 1   sacubitril-valsartan (ENTRESTO) 24-26 MG Take 1 tablet by mouth 2 (two) times daily.     No current facility-administered medications for this visit.     Past Surgical History:  Procedure Laterality Date  APPENDECTOMY  ~ [place]  Jan 2012   patent stents in RCA graft; CAD stable   CARDIAC CATHETERIZATION N/A 06/03/2015   Procedure: Left Heart Cath and Cors/Grafts Angiography;  Surgeon: Belva Crome, MD;  Location: [facility];  Service: Cardiovascular;  Laterality: N/A;   CARPAL TUNNEL WITH CUBITAL TUNNEL Right 2001   CORONARY ANGIOPLASTY WITH STENT PLACEMENT  2011   Promus Premier DES, 3.5 mm x 12 mm, for 99% proximal stent ISR in the SVG-RPDA    CORONARY ANGIOPLASTY WITH STENT PLACEMENT  05/24/2014   CORONARY ARTERY BYPASS GRAFT  2005   LIMA to AL, SVG to LAD, SVG to Cx, SVG-PDA   KNEE ARTHROSCOPY W/ ACL RECONSTRUCTION Left 2001   LAPAROSCOPIC CHOLECYSTECTOMY  2010   LEFT HEART CATHETERIZATION WITH CORONARY/GRAFT ANGIOGRAM N/A 05/24/2014   Procedure: LEFT HEART CATHETERIZATION WITH Beatrix Fetters;  Surgeon: Leonie Man, MD; EF 50-55%, LAD & D1 100%, LIMA-D1 OK, CFX OK, OM1 & OM2 100%, lat OM Kewanna Kasprzak 100%, RCA 100%; pSVG-RPDA 99% rx w/ scoring balloon PTCA & 3.5 x 12 mm Promus DES; SVG-LAD, SVG-OM known occluded       TUBAL LIGATION  ~ [place]  ~ 1993     Allergies  Allergen Reactions   Pneumococcal Vaccines Anxiety, Palpitations and Cough   Adhesive [Tape] Itching and Other (See Comments)    Blisters (pls use paper tape)   Crestor [Rosuvastatin] Other (See Comments)    Muscle pain   Lipitor [Atorvastatin] Other (See Comments)    Cramps   Morphine And Related Itching    Pt states she takes with benadryl      Family History  Problem Relation Age of Onset   Heart attack Mother    Heart disease  Mother    Breast cancer Mother    Heart attack Father    Heart disease Father    Colon cancer Neg Hx      Social History [name] reports that she quit smoking about 6 years ago. Her smoking use included cigarettes. She started smoking about 28 years ago. She has a 10.50 pack-year smoking history. She has never used smokeless tobacco. [name] reports no history of alcohol use.   Review of Systems CONSTITUTIONAL: No weight loss, fever, chills, weakness or fatigue.  HEENT: Eyes: No visual loss, blurred vision, double vision or yellow sclerae.No hearing loss, sneezing, congestion, runny nose or sore throat.  SKIN: No rash or itching.  CARDIOVASCULAR:  RESPIRATORY: No shortness of breath, cough or sputum.  GASTROINTESTINAL: No anorexia, nausea, vomiting or diarrhea. No abdominal pain or blood.  GENITOURINARY: No burning on urination, no polyuria NEUROLOGICAL: No headache, dizziness, syncope, paralysis, ataxia, numbness or tingling in the extremities. No change in bowel or bladder control.  MUSCULOSKELETAL: No muscle, back pain, joint pain or stiffness.  LYMPHATICS: No enlarged nodes. No history of splenectomy.  PSYCHIATRIC: No history of depression or anxiety.  ENDOCRINOLOGIC: No reports of sweating, cold or heat intolerance. No polyuria or polydipsia.  Marland Kitchen   Physical Examination There were no vitals filed for this visit. There were no vitals filed for this visit.  Gen: resting comfortably, no acute distress HEENT: no scleral icterus, pupils equal round and reactive, no palptable cervical adenopathy,  CV Resp: Clear to auscultation bilaterally GI: abdomen is soft, non-tender, non-distended, normal bowel sounds, no hepatosplenomegaly MSK: extremities are warm, no edema.  Skin: warm, no rash Neuro:  no focal deficits Psych: appropriate  affect   Diagnostic Studies  Cath 2012   Left main coronary was normal.   Left anterior descending artery was occluded just after the septal    perforator. First diagonal Zafirah Vanzee was subtotally occluded. There was   some filling of the distal LAD and diagonals from collateral branches   from the LIMA. Circumflex coronary artery was occluded in the   midvessel. The AV groove Stella Encarnacion was patent. There was a small first   obtuse marginal Malina Geers with 30-40% multiple lesions. There was a small   second obtuse marginal Roxanne Panek with 30-40% discrete lesions. There is a   very large obtuse marginal Daksh Coates seen filling from collaterals from the   left internal mammary artery.   Right coronary artery was 100% occluded proximally with both left-to-   right and right-to-right collaterals. The distal PDA also filled from   the vein graft to the right coronary artery.   The left internal mammary artery was widely patent. It also filled the   distal right large obtuse marginal Suhas Estis and filled retrograde to a   smaller diagonal Eriyonna Matsushita.   There was known occlusion of the 2 left-sided vein grafts. The   saphenous vein graft to the right coronary artery was widely patent.   Bare-metal stent in the mid-to-distal vessel widely patent. There was   poor runoff to the vessel primarily being retrograde into the posterior   lateral Ashling Roane, however, there was no new focal stenosis.   RAO ventriculography. RAO ventriculography showed anterior apical and   mid inferior wall hypokinesis. EF was in the 40% range.   Right heart catheterization showed mean pulmonary capillary wedge   pressure of 24, PA pressure was 55/26, RV pressure was 52/5, RA pressure   mean was 16, aortic pressure was 155/76, LV pressure was equal to 167/16   with a post A-wave EDP of 26.   Cardiac output was 6.3 liters per minute with a cardiac index of 3   liters per minute per meter squared by Fick.    IMPRESSION: The patient's coronary artery disease is stable. The   stents in the RCA graft were patent. There are no new lesions that I   can see. She does have collateralized distal  right and OM Dijon Kohlman.   Continued medical therapy is warranted. If she continues to have chest   pain, Ranexa may be in order since it is somewhat late in the day and   she had both venous and arterial sheath. She will be kept overnight and   discharged in the morning. She tolerated the procedure well.     03/2014 Echo   Study Conclusions  - Left ventricle: The cavity size was normal. Wall thickness was increased in a pattern of mild LVH. Systolic function was normal. The estimated ejection fraction was in the range of 50% to 55%. There is akinesis of the basalinferolateral myocardium. Features are consistent with a pseudonormal left ventricular filling pattern, with concomitant abnormal relaxation and increased filling pressure (grade 2 diastolic dysfunction). - Aortic valve: Mildly calcified annulus. Trileaflet. There was no significant regurgitation. - Mitral valve: There was trivial regurgitation. - Left atrium: The atrium was mildly to moderately dilated. - Right ventricle: Systolic function was mildly reduced. - Right atrium: Central venous pressure (est): 3 mm Hg. - Atrial septum: No defect or patent foramen ovale was identified. - Tricuspid valve: There was trivial regurgitation. - Pulmonary arteries: Systolic pressure could not be accurately estimated. - Pericardium, extracardiac: There was no  pericardial effusion.  Impressions:  - Mild LVH with LVEF 50-55%, basal inferolateral hypokinesis, grade 2 diastolic dysfunction. Mild to moderate left atrial enlargement. Mildly reduced RV contraction. Unable to assess PASP.  03/2014 Carotid US   IMPRESSION:   Bilateral 50-69% stenosis. The left internal carotid peak systolic   velocity has increased slightly in the interval from the prior exam.   05/2014 Cath FINDINGS:   Hemodynamics:   Central Aortic Pressure / Mean: 110/62/81 mmHg Left Ventricular Pressure / LVEDP: 110/14/21 mmHg   Left Ventriculography: EF: 50 at 55  % Wall Motion: Severe hypokinesis of the Basal to MID inferior-inferolateraL wall with mild hypokinesis of the basal anterior wall   Coronary Anatomy: Dominance: Right Left Main: Normal caliber vessel that itself is free of significant disease. Bifurcates into the LAD and Circumflex LAD: Moderate caliber vessel that is 100% occluded after a septal perforator and small diagonal Randee Huston. There is a first diagonal Kiven Vangilder that is also half percent occluded after initial subtotal occlusion.   Left Circumflex: Moderate caliber vessel it gives rise to a proximal small caliber OM Murray Guzzetta that is somewhat tortuous and free of significant disease. The vessel then bifurcates into the introducer complex which is small in caliber and perfuses the basal inferolateral wall. There is a major lateral OM Shakyra Mattera the visualized the proximal segment is occluded. There is late retrograde filling of the remainder the nature OM which appears to have 2 branches distally. This is faint collaterals from the proximal OM.    RCA: 100% proximal occluded. SVG-RPDA: Large-caliber graft with mildly irregular is proximally, there are overlapping stents in the distal mid graft with 99% napkin ring proximal in-stent restenosis of the most proximal stent that extends just minimally to the unstented segment. The remainder of the graft is relatively free of disease and attached to the mid RPDA. RPDA: Large-caliber tortuous vessel that reaches almost to the apex. RPL Sysytem:The RPAV fills via retrograde flow in the RPDA. There are several small posterolateral branches that are relatively free of disease. The PAV is relatively free of disease.   After reviewing the initial angiography, the culprit lesion was thought to be the 99% proximal stent ISR in the SVG-RPDA.  Preparation were made to proceed with PCI on this lesion.  As the lesion is focal & appears to be fibrous in nature and too stenosed to safely pass a filter wire, the decision was  made to pre-dilate & stent without distal protection.  No evidence of a filling defect was noted besides the "napkin-ring" lesion.   Percutaneous Coronary Intervention:  Sheath exchanged for 6 Fr Guide: 6 Fr   AL1        Guidewire: BMW (after failed attempt with Pro-Water Predilation Balloon: Angioscult Cutting Balloon 3.5 mm x 10 mm;   12 Atm x 45 Sec,-- excellent predilation of the fibrous lesion at high pressure. Brisk TIMI-3 flow down stream but no sign of no reflow. The decision was made to proceed with stenting. Stent: Promus Premier DES 3.5 mm x 12 mm; overlaps roughly 4 mm into the original stent. Deployed at: 20 Atm x 30 Sec Post-dilation Balloon: [place] Emerge 4.0 mm x 8 mm;   14 Atm x 30 Sec x 2 -   Final Diameter: 4.1 mm   Post deployment angiography in multiple views, with and without guidewire in place revealed excellent stent deployment and lesion coverage.  There was no evidence of dissection or perforation. Brisk TIMI-3 flow is noted in the downstream vessel  MEDICATIONS: Anesthesia:  Local Lidocaine 2 ml Sedation:  4 mg IV Versed, 100 mcg IV fentanyl ;   Omnipaque Contrast: 180 ml Anticoagulation:  IV Heparin 13500 Units total   Anti-Platelet Agent:  Effient and aspirin as standing home medication   PATIENT DISPOSITION:   The patient was transferred to the PACU holding area in a hemodynamicaly stable, chest pain free condition. The patient tolerated the procedure well, and there were no complications.  EBL:   < 5 ml The patient was stable before, during, and after the procedure.   POST-OPERATIVE DIAGNOSIS:   Severe native CAD with 100% negative RCA, LAD & D1 along with OM 1 and OM 2 as previously described Severe proximal edge in-stent restenosis of SVG-RPDA   Successful PCI of the proximal edge in-stent restenosis of SVG RCA with scoring balloon angioplasty followed by DES stent placement: Promus Premier 3.5 mm x 12 mm postdilated to 4.1 mm Widely patent LIMA-diagonal  with distal collaterals to what appears to be the apical LAD. Known occlusion of the SVG-LAD and SVG-OM Relatively preserved LVEF as noted on the cardiogram recently. There is known hypokinesis to akinesis of the nasal to mid inferior inferolateral wall as well as mild hypokinesis of the basal anterior wall assistant with known CAD.   PLAN OF CARE: Overnight observation post-PCI. Continue home doses of aspirin plus Effient. Standard post radial cath care with care being removal   Discharge in the morning if stable. Followup with Dr. Harl Bowie - continue to optimize medical management of existing severe CAD.      05/2015 Cath                                                             Dominance: Co-dominant                             Left Anterior Descending                Mid LAD lesion, 100% stenosed.                First Diagonal Asheton Scheffler           The vessel is small in size.                1st Diag lesion, 99% stenosed.                          Ramus Intermedius      The vessel is small .                               Left Circumflex      The vessel is small .                Mid Cx to Dist Cx lesion, 100% stenosed.                Second Obtuse Marginal Gaynor Ferreras           The vessel is small in size.                Third Designer, fashion/clothing  3rd Mrg filled by collaterals from 1st Mrg.                               Right Coronary Artery                Prox RCA to Mid RCA lesion, 100% stenosed.                Dist RCA lesion, 100% stenosed.                               Graft Angiography                      Free Graft to Dist LAD      SVG                Prox Graft to Mid Graft lesion, 100% stenosed.                         Free Graft to Dist RCA      SVG                Mid Graft lesion, 30% stenosed. The lesion was previously treated with a stent (unknown type) .                         Free Graft to 2nd Mrg      SVG                Origin to Prox Graft  lesion, 100% stenosed.                  Free LIMA Graft to 2nd Diag      LIMA                      Cath RECOMMENDATIONS:    Continued aggressive risk factor modification No anatomy amenable to percutaneous intervention.          09/2017 carotid US    Final Interpretation: Right Carotid: There is evidence in the right ICA of a 40-59% stenosis. Stable                40-59% RICA stenosis.  Left Carotid: There is evidence in the left ICA of a 40-59% stenosis. Stable               63-01% LICA stenosis.  Vertebrals:  Both vertebral arteries were patent with antegrade flow. Subclavians: Normal flow hemodynamics were seen in bilateral subclavian              arteries.     06/2018 nuclear stress   Moderate inferior (base, mid, distal), inferolateral (base, mid) defect consistent with scar and possible soft tissue attenuation No significant ischemia LVEF 33% High risk scan due to scar and depressed LVEF   06/2018 echo Study Conclusions   - Left ventricle: The cavity size was mildly dilated. Wall   thickness was normal. Systolic function was normal. The estimated   ejection fraction was in the range of 55% to 60%. - Mitral valve: There was mild regurgitation. - Left atrium: The atrium was mildly dilated. - Pulmonary arteries: PA peak pressure: 32 mm Hg (S).     08/2020 nuclear stress [facility] 1. Exam positive for moderate size area of mild reversibility  involving the anterolateral wall. Fixed defect involving the  inferolateral wall and inferior wall compatible with scar.   2. Diffusely abnormal left ventricular wall motion with inferior  septal and inferior wall hypokinesis and inferolateral wall  hypokinesis and apical akinesis..   3. Left ventricular ejection fraction 31%   4. Non invasive risk stratification*: High      08/2020 echo Summary    1. The left ventricle is normal in size with upper normal wall thickness.    2. The left ventricular systolic function is  overall normal, LVEF is  visually estimated at 55%.    3. There is grade II diastolic dysfunction (elevated filling pressure).    4. The left atrium is moderately dilated in size.    5. The right ventricle is normal in size, with normal systolic function.    6. The right atrium is mildly dilated  in size.      07/2021 echo IMPRESSIONS     1. Left ventricular ejection fraction, by estimation, is 40 to 45%. The  left ventricle has mildly decreased function. The left ventricle  demonstrates regional wall motion abnormalities (see scoring  diagram/findings for description). There is mild left  ventricular hypertrophy. Left ventricular diastolic parameters are  indeterminate.   2. Right ventricular systolic function is normal. The right ventricular  size is normal. There is normal pulmonary artery systolic pressure. The  estimated right ventricular systolic pressure is 58.3 mmHg.   3. The mitral valve is grossly normal. Trivial mitral valve  regurgitation.   4. The aortic valve is tricuspid. Aortic valve regurgitation is not  visualized. Mild aortic valve sclerosis is present, with no evidence of  aortic valve stenosis.   5. The inferior vena cava is normal in size with greater than 50%  respiratory variability, suggesting right atrial pressure of 3 mmHg.     Assessment and Plan  1. CAD /ICM - antianginal therapy limited. She is on max dose norvasc. Lower heart rates, would not increase beta blocker. Imdur may be contributing to headaches, lower dose to 22m daily. Prior headaches on ranexa -  06/2018 stress test without significant ischemia   -no recent symptoms - d/c benazepril, wait 48 hrs start entresto 24/221mbid - stop aspirin since on eliquis, should be off effient as well     2. Carotid stenosis   - moderate by last [place]-continue to monitor   3. HTN   - continue current meds   4. Afib - relatively new diagnosis, remaisn symptomatic despite rate control - plan for  outpatient cardioversion        JoArnoldo LenisM.D., F.A.C.C.

## 2021-09-19 ENCOUNTER — Telehealth: Payer: Self-pay | Admitting: Cardiology

## 2021-09-19 MED ORDER — SACUBITRIL-VALSARTAN 24-26 MG PO TABS: 1.0000 | ORAL_TABLET | Freq: Two times a day (BID) | ORAL | 0 refills | Status: DC

## 2021-09-19 NOTE — Telephone Encounter (Signed)
Pt c/o medication issue:  1. Name of Medication: sacubitril-valsartan (ENTRESTO) 24-26 MG  2. How are you currently taking this medication (dosage and times per day)? Out of medication  3. Are you having a reaction (difficulty breathing--STAT)? no  4. What is your medication issue? Patient states she has been completely out of the medication for 2 weeks. She states the pharmacy is not able to fill it and it will cost her $ 600. She says she can tell she is off the medication, because she has no energy. She says the medication is a "miracle drug" for her, because she had so much energy she even lost 12 lbs. She says now that she has been out and not taken it she has no energy to leave the house.

## 2021-09-19 NOTE — Telephone Encounter (Signed)
Advised that entresto samples are available for pick up along with [employer]-PAF that she is to fill out all patient sections and bring back to office with proof of income and prescription out of pocket expense for 2022 & 2023. Verbalized understanding.

## 2021-09-24 ENCOUNTER — Other Ambulatory Visit (HOSPITAL_COMMUNITY): Payer: Self-pay

## 2021-09-25 ENCOUNTER — Other Ambulatory Visit: Payer: Self-pay | Admitting: Internal Medicine

## 2021-10-01 ENCOUNTER — Ambulatory Visit (HOSPITAL_COMMUNITY): Payer: Medicaid Other

## 2021-10-09 ENCOUNTER — Ambulatory Visit (HOSPITAL_COMMUNITY): Payer: Medicaid Other

## 2021-10-09 ENCOUNTER — Ambulatory Visit (HOSPITAL_COMMUNITY): Payer: Medicare Other

## 2021-10-14 NOTE — Telephone Encounter (Signed)
Advised that entresto samples are available for pick up along with [employer]-PAF that she is to fill out all patient sections and bring back to office with proof of income and prescription out of pocket expense for 2022 & 2023. Verbalized understanding.

## 2021-10-14 NOTE — Telephone Encounter (Signed)
[name] is calling requesting a callback in regards to receiving assistance for this prescription. She states she never picked up the paperwork or samples. Please advise.

## 2021-10-28 ENCOUNTER — Ambulatory Visit: Payer: Medicare Other | Admitting: Cardiology

## 2021-10-28 NOTE — Progress Notes (Deleted)
Clinical Summary [name] is a 65 y.o.female seen today for follow up of the following medical problems.      seen today for follow up of the following medical problems.      1. CAD   - CABG 2005 4 vessel    cath 05/2015 results as reported below, no targets for pci, recs for medical management. Was started on ranexa 546m bid, had headache on nitrates but recently on lower dose.   - headaches on ranexa.      06/2018 echo LVEF 55-60% 06/2018 nuclear stress without ischemia   -  Remains on effient. Looking back had been on long time even before I started seeing her in 2014, I believe her prior cardiologist maintained her on DAPT due to her extensive cardiac revasc history. Has tolerated several years     07/2021 echo LVEF 40-45%, normal RV   - no edema.DOE with activities, fatigue    - entresto causes shakes, difficultly rest, nausea, dizziness - presented 08/12/21 with chest pain    -trouble with cost of entresto.   2.Hypotension - admitted 08/2021 with low bp's, fatigue due to poor oral intake and hypovolemia - transiently on levophed, received IVFs - infectious workup negative, normal random cortisol -      2. Carotid stenosis   - Carotid [place] 40-59% bilateral [id number] 469-62% LICA 495-28%  08/2020 carotid [place] right 441-32% LICA 744-01%- no recent symptoms   3. HTN   - she is compliant with meds     4. Hyperlipidemia   - did not tolerate crestor, prava, or lovastatin due to side effects.   - remains on zetia   - labs followed by pcp, we do not have the most recent results     5. Afib - new diagnosis 06/16/2021 - on eliquis - ongoing palpitations, fatigue, dizziness    -was planned for outpatient cardioversion however presented to ER with symptoms, was cardioverted in ER 07/30/21.     6. PVCs - started on mexilitene by Dr OMarisue Ivanduring 08/13/2021 admission Past Medical History:  Diagnosis Date   Acute renal failure ([place]    Anemia     Anticoagulation adequate 08/15/2021   Anxiety    Arthritis    Asthma    Carotid artery disease ([place]    a. Duplex 50-69% BICA 03/2014.   Chronic back pain    Collagen vascular disease (HCC)    COPD (chronic obstructive pulmonary disease) (HCC)    Coronary artery disease    a. s/p prior CABG 2005. b. BMS 02/2009 to VG-PDA. c. NSTEMI 08/2010 s/p PTCA/DES to SVG-PDA in previously stented area. d. DES to SVG to RCA on 05/24/14  c. LHC 06/04/2015 w/ no sig change in her anatomy and Rx managment   DDD (degenerative disc disease)    Depression    Diverticulitis    4 documented episodes by CT; most recent April 2012   DIVERTICULITIS OF COLON 07/14/2010   Qualifier: Diagnosis of  By: LWestly Pam    GERD (gastroesophageal reflux disease)    History of blood transfusion    HTN (hypertension)    Hyperlipidemia    Hypertension    Hypothyroidism    Ischemic cardiomyopathy    a. EF 40% in 2012 by cath. b. improved to 50-55% by cath 05/2014  [facility]   Kidney stones    Persistent atrial fibrillation ([place] with DCCV 07/30/21 maintaining SR 08/15/2021  Seizures ([place])    Shock ([place]) hypovolemic 08/13/2021   Sinus bradycardia    Sleep apnea    a. Mild-to-moderate obstructive sleep apnea diagnosed in April 2012 by sleep study.   Tension headache      Allergies  Allergen Reactions   Pneumococcal Vaccines Anxiety, Palpitations and Cough   Adhesive [Tape] Itching and Other (See Comments)    Blisters (pls use paper tape)   Crestor [Rosuvastatin] Other (See Comments)    Muscle pain   Lipitor [Atorvastatin] Other (See Comments)    Cramps   Morphine And Related Itching    Pt states she takes with benadryl     Current Outpatient Medications  Medication Sig Dispense Refill   acetaminophen (TYLENOL) 650 MG CR tablet Take 1,300 mg by mouth every 8 (eight) hours as needed for pain.     albuterol (VENTOLIN HFA) 108 (90 Base) MCG/ACT inhaler Inhale 2 puffs into the lungs  every 6 (six) hours as needed for wheezing or shortness of breath.     apixaban (ELIQUIS) 5 MG TABS tablet Take 1 tablet (5 mg total) by mouth 2 (two) times daily. 60 tablet 6   aspirin 81 MG chewable tablet Chew 1 tablet (81 mg total) by mouth daily.     cetirizine (ZYRTEC ALLERGY) 10 MG tablet Take 1 tablet (10 mg total) by mouth daily. 30 tablet 0   ezetimibe (ZETIA) 10 MG tablet TAKE (1) TABLET BY MOUTH ONCE DAILY. (Patient taking differently: Take 10 mg by mouth daily.) 90 tablet 3   furosemide (LASIX) 80 MG tablet Take 1 tablet (80 mg total) by mouth daily as needed for edema (we changed to if needed because you were dehydrated). 90 tablet 3   hydrOXYzine (ATARAX/VISTARIL) 25 MG tablet Take 1 tablet (25 mg total) by mouth every 6 (six) hours as needed for anxiety. 30 tablet 0   levETIRAcetam (KEPPRA) 250 MG tablet TAKE (1) TABLET BY MOUTH TWICE DAILY. 180 tablet 0   levothyroxine (SYNTHROID) 75 MCG tablet Take 1 tablet (75 mcg total) by mouth daily before breakfast. 90 tablet 3   metoprolol succinate (TOPROL-XL) 25 MG 24 hr tablet Take 1 tablet (25 mg total) by mouth daily. 30 tablet 6   metoprolol succinate (TOPROL-XL) 50 MG 24 hr tablet TAKE (1) TABLET BY MOUTH ONCE DAILY. TAKE WITH OR IMMEDIATELY FOLLOWING A MEAL. 90 tablet 1   mexiletine (MEXITIL) 150 MG capsule Take 1 capsule (150 mg total) by mouth every 8 (eight) hours. 90 capsule 6   Multiple Vitamin (MULTIVITAMIN WITH MINERALS) TABS tablet Take 1 tablet by mouth 2 (two) times daily. (Patient taking differently: Take 1 tablet by mouth daily.) 25 tablet 4   nitroGLYCERIN (NITROSTAT) 0.4 MG SL tablet DISSOLVE 1 TABLET UNDER TONGUE EVERY 5 MINUTES UP TO 15 MIN FOR CHESTPAIN. IF NO RELIEF CALL 911. 25 tablet 3   Omega 3 1000 MG CAPS Take 1 capsule (1,000 mg total) by mouth 2 (two) times daily.     omeprazole (PRILOSEC) 20 MG capsule TAKE (1) CAPSULE BY MOUTH ONCE DAILY. 90 capsule 0   potassium chloride SA (KLOR-CON) 20 MEQ tablet Take 1  tablet (20 mEq total) by mouth daily as needed. 90 tablet 3   rosuvastatin (CRESTOR) 5 MG tablet Take 1 tablet (5 mg total) by mouth daily. 90 tablet 1   sacubitril-valsartan (ENTRESTO) 24-26 MG Take 1 tablet by mouth 2 (two) times daily. 56 tablet 0   No current facility-administered medications for this visit.  Past Surgical History:  Procedure Laterality Date   APPENDECTOMY  ~ [place] CATHETERIZATION  Jan 2012   patent stents in RCA graft; CAD stable   CARDIAC CATHETERIZATION N/A 06/03/2015   Procedure: Left Heart Cath and Cors/Grafts Angiography;  Surgeon: Belva Crome, MD;  Location: [facility];  Service: Cardiovascular;  Laterality: N/A;   CARPAL TUNNEL WITH CUBITAL TUNNEL Right 2001   CORONARY ANGIOPLASTY WITH STENT PLACEMENT  2011   Promus Premier DES, 3.5 mm x 12 mm, for 99% proximal stent ISR in the SVG-RPDA    CORONARY ANGIOPLASTY WITH STENT PLACEMENT  05/24/2014   CORONARY ARTERY BYPASS GRAFT  2005   LIMA to AL, SVG to LAD, SVG to Cx, SVG-PDA   KNEE ARTHROSCOPY W/ ACL RECONSTRUCTION Left 2001   LAPAROSCOPIC CHOLECYSTECTOMY  2010   LEFT HEART CATHETERIZATION WITH CORONARY/GRAFT ANGIOGRAM N/A 05/24/2014   Procedure: LEFT HEART CATHETERIZATION WITH Beatrix Fetters;  Surgeon: Leonie Man, MD; EF 50-55%, LAD & D1 100%, LIMA-D1 OK, CFX OK, OM1 & OM2 100%, lat OM Kambrey Hagger 100%, RCA 100%; pSVG-RPDA 99% rx w/ scoring balloon PTCA & 3.5 x 12 mm Promus DES; SVG-LAD, SVG-OM known occluded       TUBAL LIGATION  ~ [place]  ~ 1993     Allergies  Allergen Reactions   Pneumococcal Vaccines Anxiety, Palpitations and Cough   Adhesive [Tape] Itching and Other (See Comments)    Blisters (pls use paper tape)   Crestor [Rosuvastatin] Other (See Comments)    Muscle pain   Lipitor [Atorvastatin] Other (See Comments)    Cramps   Morphine And Related Itching    Pt states she takes with benadryl      Family History  Problem Relation Age of  Onset   Heart attack Mother    Heart disease Mother    Breast cancer Mother    Heart attack Father    Heart disease Father    Colon cancer Neg Hx      Social History Ms. Hancox reports that she quit smoking about 6 years ago. Her smoking use included cigarettes. She started smoking about 28 years ago. She has a 10.50 pack-year smoking history. She has never used smokeless tobacco. Ms. Koke reports no history of alcohol use.   Review of Systems CONSTITUTIONAL: No weight loss, fever, chills, weakness or fatigue.  HEENT: Eyes: No visual loss, blurred vision, double vision or yellow sclerae.No hearing loss, sneezing, congestion, runny nose or sore throat.  SKIN: No rash or itching.  CARDIOVASCULAR:  RESPIRATORY: No shortness of breath, cough or sputum.  GASTROINTESTINAL: No anorexia, nausea, vomiting or diarrhea. No abdominal pain or blood.  GENITOURINARY: No burning on urination, no polyuria NEUROLOGICAL: No headache, dizziness, syncope, paralysis, ataxia, numbness or tingling in the extremities. No change in bowel or bladder control.  MUSCULOSKELETAL: No muscle, back pain, joint pain or stiffness.  LYMPHATICS: No enlarged nodes. No history of splenectomy.  PSYCHIATRIC: No history of depression or anxiety.  ENDOCRINOLOGIC: No reports of sweating, cold or heat intolerance. No polyuria or polydipsia.  Marland Kitchen   Physical Examination There were no vitals filed for this visit. There were no vitals filed for this visit.  Gen: resting comfortably, no acute distress HEENT: no scleral icterus, pupils equal round and reactive, no palptable cervical adenopathy,  CV Resp: Clear to auscultation bilaterally GI: abdomen is soft, non-tender, non-distended, normal bowel sounds, no hepatosplenomegaly MSK: extremities are warm, no edema.  Skin: warm,  no rash Neuro:  no focal deficits Psych: appropriate affect   Diagnostic Studies  Cath 2012   Left main coronary was normal.   Left anterior  descending artery was occluded just after the septal   perforator. First diagonal Elin Fenley was subtotally occluded. There was   some filling of the distal LAD and diagonals from collateral branches   from the LIMA. Circumflex coronary artery was occluded in the   midvessel. The AV groove Eldor Conaway was patent. There was a small first   obtuse marginal Hearl Heikes with 30-40% multiple lesions. There was a small   second obtuse marginal Keeon Zurn with 30-40% discrete lesions. There is a   very large obtuse marginal Chantille Navarrete seen filling from collaterals from the   left internal mammary artery.   Right coronary artery was 100% occluded proximally with both left-to-   right and right-to-right collaterals. The distal PDA also filled from   the vein graft to the right coronary artery.   The left internal mammary artery was widely patent. It also filled the   distal right large obtuse marginal Lonney Revak and filled retrograde to a   smaller diagonal Maynard David.   There was known occlusion of the 2 left-sided vein grafts. The   saphenous vein graft to the right coronary artery was widely patent.   Bare-metal stent in the mid-to-distal vessel widely patent. There was   poor runoff to the vessel primarily being retrograde into the posterior   lateral Lennyx Verdell, however, there was no new focal stenosis.   RAO ventriculography. RAO ventriculography showed anterior apical and   mid inferior wall hypokinesis. EF was in the 40% range.   Right heart catheterization showed mean pulmonary capillary wedge   pressure of 24, PA pressure was 55/26, RV pressure was 52/5, RA pressure   mean was 16, aortic pressure was 155/76, LV pressure was equal to 167/16   with a post A-wave EDP of 26.   Cardiac output was 6.3 liters per minute with a cardiac index of 3   liters per minute per meter squared by Fick.    IMPRESSION: The patient's coronary artery disease is stable. The   stents in the RCA graft were patent. There are no new lesions that  I   can see. She does have collateralized distal right and OM Alessio Bogan.   Continued medical therapy is warranted. If she continues to have chest   pain, Ranexa may be in order since it is somewhat late in the day and   she had both venous and arterial sheath. She will be kept overnight and   discharged in the morning. She tolerated the procedure well.     03/2014 Echo   Study Conclusions  - Left ventricle: The cavity size was normal. Wall thickness was increased in a pattern of mild LVH. Systolic function was normal. The estimated ejection fraction was in the range of 50% to 55%. There is akinesis of the basalinferolateral myocardium. Features are consistent with a pseudonormal left ventricular filling pattern, with concomitant abnormal relaxation and increased filling pressure (grade 2 diastolic dysfunction). - Aortic valve: Mildly calcified annulus. Trileaflet. There was no significant regurgitation. - Mitral valve: There was trivial regurgitation. - Left atrium: The atrium was mildly to moderately dilated. - Right ventricle: Systolic function was mildly reduced. - Right atrium: Central venous pressure (est): 3 mm Hg. - Atrial septum: No defect or patent foramen ovale was identified. - Tricuspid valve: There was trivial regurgitation. - Pulmonary arteries: Systolic pressure could not  be accurately estimated. - Pericardium, extracardiac: There was no pericardial effusion.  Impressions:  - Mild LVH with LVEF 50-55%, basal inferolateral hypokinesis, grade 2 diastolic dysfunction. Mild to moderate left atrial enlargement. Mildly reduced RV contraction. Unable to assess PASP.  03/2014 Carotid US   IMPRESSION:   Bilateral 50-69% stenosis. The left internal carotid peak systolic   velocity has increased slightly in the interval from the prior exam.   05/2014 Cath FINDINGS:   Hemodynamics:   Central Aortic Pressure / Mean: 110/62/81 mmHg Left Ventricular Pressure / LVEDP: 110/14/21  mmHg   Left Ventriculography: EF: 50 at 55 % Wall Motion: Severe hypokinesis of the Basal to MID inferior-inferolateraL wall with mild hypokinesis of the basal anterior wall   Coronary Anatomy: Dominance: Right Left Main: Normal caliber vessel that itself is free of significant disease. Bifurcates into the LAD and Circumflex LAD: Moderate caliber vessel that is 100% occluded after a septal perforator and small diagonal Briahna Pescador. There is a first diagonal Judah Chevere that is also half percent occluded after initial subtotal occlusion.   Left Circumflex: Moderate caliber vessel it gives rise to a proximal small caliber OM Connee Ikner that is somewhat tortuous and free of significant disease. The vessel then bifurcates into the introducer complex which is small in caliber and perfuses the basal inferolateral wall. There is a major lateral OM Pelagia Iacobucci the visualized the proximal segment is occluded. There is late retrograde filling of the remainder the nature OM which appears to have 2 branches distally. This is faint collaterals from the proximal OM.    RCA: 100% proximal occluded. SVG-RPDA: Large-caliber graft with mildly irregular is proximally, there are overlapping stents in the distal mid graft with 99% napkin ring proximal in-stent restenosis of the most proximal stent that extends just minimally to the unstented segment. The remainder of the graft is relatively free of disease and attached to the mid RPDA. RPDA: Large-caliber tortuous vessel that reaches almost to the apex. RPL Sysytem:The RPAV fills via retrograde flow in the RPDA. There are several small posterolateral branches that are relatively free of disease. The PAV is relatively free of disease.   After reviewing the initial angiography, the culprit lesion was thought to be the 99% proximal stent ISR in the SVG-RPDA.  Preparation were made to proceed with PCI on this lesion.  As the lesion is focal & appears to be fibrous in nature and too stenosed  to safely pass a filter wire, the decision was made to pre-dilate & stent without distal protection.  No evidence of a filling defect was noted besides the "napkin-ring" lesion.   Percutaneous Coronary Intervention:  Sheath exchanged for 6 Fr Guide: 6 Fr   AL1        Guidewire: BMW (after failed attempt with Pro-Water Predilation Balloon: Angioscult Cutting Balloon 3.5 mm x 10 mm;   12 Atm x 45 Sec,-- excellent predilation of the fibrous lesion at high pressure. Brisk TIMI-3 flow down stream but no sign of no reflow. The decision was made to proceed with stenting. Stent: Promus Premier DES 3.5 mm x 12 mm; overlaps roughly 4 mm into the original stent. Deployed at: 20 Atm x 30 Sec Post-dilation Balloon: Vincent Emerge 4.0 mm x 8 mm;   14 Atm x 30 Sec x 2 -   Final Diameter: 4.1 mm   Post deployment angiography in multiple views, with and without guidewire in place revealed excellent stent deployment and lesion coverage.  There was no evidence of dissection or perforation.  Brisk TIMI-3 flow is noted in the downstream vessel   MEDICATIONS: Anesthesia:  Local Lidocaine 2 ml Sedation:  4 mg IV Versed, 100 mcg IV fentanyl ;   Omnipaque Contrast: 180 ml Anticoagulation:  IV Heparin 13500 Units total   Anti-Platelet Agent:  Effient and aspirin as standing home medication   PATIENT DISPOSITION:   The patient was transferred to the PACU holding area in a hemodynamicaly stable, chest pain free condition. The patient tolerated the procedure well, and there were no complications.  EBL:   < 5 ml The patient was stable before, during, and after the procedure.   POST-OPERATIVE DIAGNOSIS:   Severe native CAD with 100% negative RCA, LAD & D1 along with OM 1 and OM 2 as previously described Severe proximal edge in-stent restenosis of SVG-RPDA   Successful PCI of the proximal edge in-stent restenosis of SVG RCA with scoring balloon angioplasty followed by DES stent placement: Promus Premier 3.5 mm x 12 mm  postdilated to 4.1 mm Widely patent LIMA-diagonal with distal collaterals to what appears to be the apical LAD. Known occlusion of the SVG-LAD and SVG-OM Relatively preserved LVEF as noted on the cardiogram recently. There is known hypokinesis to akinesis of the nasal to mid inferior inferolateral wall as well as mild hypokinesis of the basal anterior wall assistant with known CAD.   PLAN OF CARE: Overnight observation post-PCI. Continue home doses of aspirin plus Effient. Standard post radial cath care with care being removal   Discharge in the morning if stable. Followup with Dr. Harl Bowie - continue to optimize medical management of existing severe CAD.      05/2015 Cath                                                             Dominance: Co-dominant                             Left Anterior Descending                Mid LAD lesion, 100% stenosed.                First Diagonal Kimon Loewen           The vessel is small in size.                1st Diag lesion, 99% stenosed.                          Ramus Intermedius      The vessel is small .                               Left Circumflex      The vessel is small .                Mid Cx to Dist Cx lesion, 100% stenosed.                Second Obtuse Marginal Hridhaan Yohn           The vessel is small in size.  Third Obtuse Marginal Torien Ramroop           3rd Mrg filled by collaterals from 1st Mrg.                               Right Coronary Artery                Prox RCA to Mid RCA lesion, 100% stenosed.                Dist RCA lesion, 100% stenosed.                               Graft Angiography                      Free Graft to Dist LAD      SVG                Prox Graft to Mid Graft lesion, 100% stenosed.                         Free Graft to Dist RCA      SVG                Mid Graft lesion, 30% stenosed. The lesion was previously treated with a stent (unknown type) .                         Free Graft to 2nd  Mrg      SVG                Origin to Prox Graft lesion, 100% stenosed.                  Free LIMA Graft to 2nd Diag      LIMA                      Cath RECOMMENDATIONS:    Continued aggressive risk factor modification No anatomy amenable to percutaneous intervention.          09/2017 carotid US    Final Interpretation: Right Carotid: There is evidence in the right ICA of a 40-59% stenosis. Stable                40-59% RICA stenosis.  Left Carotid: There is evidence in the left ICA of a 40-59% stenosis. Stable               26-94% LICA stenosis.  Vertebrals:  Both vertebral arteries were patent with antegrade flow. Subclavians: Normal flow hemodynamics were seen in bilateral subclavian              arteries.     06/2018 nuclear stress   Moderate inferior (base, mid, distal), inferolateral (base, mid) defect consistent with scar and possible soft tissue attenuation No significant ischemia LVEF 33% High risk scan due to scar and depressed LVEF   06/2018 echo Study Conclusions   - Left ventricle: The cavity size was mildly dilated. Wall   thickness was normal. Systolic function was normal. The estimated   ejection fraction was in the range of 55% to 60%. - Mitral valve: There was mild regurgitation. - Left atrium: The atrium was mildly dilated. - Pulmonary arteries: PA peak pressure: 32 mm Hg (S).  08/2020 nuclear stress [facility] 1. Exam positive for moderate size area of mild reversibility  involving the anterolateral wall. Fixed defect involving the  inferolateral wall and inferior wall compatible with scar.   2. Diffusely abnormal left ventricular wall motion with inferior  septal and inferior wall hypokinesis and inferolateral wall  hypokinesis and apical akinesis..   3. Left ventricular ejection fraction 31%   4. Non invasive risk stratification*: High      08/2020 echo Summary    1. The left ventricle is normal in size with upper normal wall thickness.     2. The left ventricular systolic function is overall normal, LVEF is  visually estimated at 55%.    3. There is grade II diastolic dysfunction (elevated filling pressure).    4. The left atrium is moderately dilated in size.    5. The right ventricle is normal in size, with normal systolic function.    6. The right atrium is mildly dilated  in size.      07/2021 echo IMPRESSIONS     1. Left ventricular ejection fraction, by estimation, is 40 to 45%. The  left ventricle has mildly decreased function. The left ventricle  demonstrates regional wall motion abnormalities (see scoring  diagram/findings for description). There is mild left  ventricular hypertrophy. Left ventricular diastolic parameters are  indeterminate.   2. Right ventricular systolic function is normal. The right ventricular  size is normal. There is normal pulmonary artery systolic pressure. The  estimated right ventricular systolic pressure is 22.4 mmHg.   3. The mitral valve is grossly normal. Trivial mitral valve  regurgitation.   4. The aortic valve is tricuspid. Aortic valve regurgitation is not  visualized. Mild aortic valve sclerosis is present, with no evidence of  aortic valve stenosis.   5. The inferior vena cava is normal in size with greater than 50%  respiratory variability, suggesting right atrial pressure of 3 mmHg.       Assessment and Plan   1. CAD /ICM - antianginal therapy limited. She is on max dose norvasc. Lower heart rates, would not increase beta blocker. Imdur may be contributing to headaches, lower dose to 22m daily. Prior headaches on ranexa -  06/2018 stress test without significant ischemia   -no recent symptoms - d/c benazepril, wait 48 hrs start entresto 24/262mbid - stop aspirin since on eliquis, should be off effient as well     2. Carotid stenosis   - moderate by last [place]-continue to monitor   3. HTN   - continue current meds   4. Afib - relatively new diagnosis,  remaisn symptomatic despite rate control - plan for outpatient cardioversion       JoArnoldo LenisM.D., F.A.C.C.

## 2021-11-03 ENCOUNTER — Other Ambulatory Visit: Payer: Self-pay | Admitting: Cardiology

## 2021-11-03 DIAGNOSIS — R0789 Other chest pain: Secondary | ICD-10-CM

## 2021-11-05 ENCOUNTER — Emergency Department (HOSPITAL_COMMUNITY): Payer: Medicare Other

## 2021-11-05 ENCOUNTER — Inpatient Hospital Stay (HOSPITAL_COMMUNITY)
Admission: EM | Admit: 2021-11-05 | Discharge: 2021-11-07 | DRG: 247 | Disposition: A | Discharge status: Home / Self Care | Payer: Medicare Other | Attending: Cardiology | Admitting: Cardiology

## 2021-11-05 ENCOUNTER — Other Ambulatory Visit: Payer: Self-pay

## 2021-11-05 ENCOUNTER — Encounter (HOSPITAL_COMMUNITY): Payer: Self-pay

## 2021-11-05 ENCOUNTER — Inpatient Hospital Stay (HOSPITAL_COMMUNITY)
Admission: EM | Disposition: A | Discharge status: Home / Self Care | Payer: Self-pay | Source: Home / Self Care | Attending: Cardiology

## 2021-11-05 DIAGNOSIS — Z79899 Other long term (current) drug therapy: Secondary | ICD-10-CM

## 2021-11-05 DIAGNOSIS — I25700 Atherosclerosis of coronary artery bypass graft(s), unspecified, with unstable angina pectoris: Secondary | ICD-10-CM | POA: Diagnosis present

## 2021-11-05 DIAGNOSIS — Z885 Allergy status to narcotic agent status: Secondary | ICD-10-CM | POA: Diagnosis not present

## 2021-11-05 DIAGNOSIS — E669 Obesity, unspecified: Secondary | ICD-10-CM | POA: Diagnosis present

## 2021-11-05 DIAGNOSIS — I4819 Other persistent atrial fibrillation: Secondary | ICD-10-CM | POA: Diagnosis present

## 2021-11-05 DIAGNOSIS — I214 Non-ST elevation (NSTEMI) myocardial infarction: Secondary | ICD-10-CM | POA: Diagnosis present

## 2021-11-05 DIAGNOSIS — I493 Ventricular premature depolarization: Secondary | ICD-10-CM | POA: Diagnosis present

## 2021-11-05 DIAGNOSIS — D649 Anemia, unspecified: Secondary | ICD-10-CM | POA: Diagnosis present

## 2021-11-05 DIAGNOSIS — J449 Chronic obstructive pulmonary disease, unspecified: Secondary | ICD-10-CM | POA: Diagnosis present

## 2021-11-05 DIAGNOSIS — E876 Hypokalemia: Secondary | ICD-10-CM | POA: Diagnosis not present

## 2021-11-05 DIAGNOSIS — T82855A Stenosis of coronary artery stent, initial encounter: Secondary | ICD-10-CM | POA: Diagnosis present

## 2021-11-05 DIAGNOSIS — E039 Hypothyroidism, unspecified: Secondary | ICD-10-CM | POA: Diagnosis present

## 2021-11-05 DIAGNOSIS — I10 Essential (primary) hypertension: Secondary | ICD-10-CM | POA: Diagnosis not present

## 2021-11-05 DIAGNOSIS — I2510 Atherosclerotic heart disease of native coronary artery without angina pectoris: Secondary | ICD-10-CM | POA: Diagnosis not present

## 2021-11-05 DIAGNOSIS — E785 Hyperlipidemia, unspecified: Secondary | ICD-10-CM | POA: Diagnosis present

## 2021-11-05 DIAGNOSIS — Z9861 Coronary angioplasty status: Secondary | ICD-10-CM | POA: Diagnosis not present

## 2021-11-05 DIAGNOSIS — Z87891 Personal history of nicotine dependence: Secondary | ICD-10-CM

## 2021-11-05 DIAGNOSIS — K219 Gastro-esophageal reflux disease without esophagitis: Secondary | ICD-10-CM | POA: Diagnosis present

## 2021-11-05 DIAGNOSIS — Y831 Surgical operation with implant of artificial internal device as the cause of abnormal reaction of the patient, or of later complication, without mention of misadventure at the time of the procedure: Secondary | ICD-10-CM | POA: Diagnosis present

## 2021-11-05 DIAGNOSIS — R519 Headache, unspecified: Secondary | ICD-10-CM | POA: Diagnosis not present

## 2021-11-05 DIAGNOSIS — Z79890 Hormone replacement therapy (postmenopausal): Secondary | ICD-10-CM

## 2021-11-05 DIAGNOSIS — I5042 Chronic combined systolic (congestive) and diastolic (congestive) heart failure: Secondary | ICD-10-CM | POA: Diagnosis present

## 2021-11-05 DIAGNOSIS — I255 Ischemic cardiomyopathy: Secondary | ICD-10-CM | POA: Diagnosis present

## 2021-11-05 DIAGNOSIS — Z888 Allergy status to other drugs, medicaments and biological substances status: Secondary | ICD-10-CM

## 2021-11-05 DIAGNOSIS — I110 Hypertensive heart disease with heart failure: Secondary | ICD-10-CM | POA: Diagnosis present

## 2021-11-05 DIAGNOSIS — I209 Angina pectoris, unspecified: Secondary | ICD-10-CM | POA: Diagnosis present

## 2021-11-05 DIAGNOSIS — I25110 Atherosclerotic heart disease of native coronary artery with unstable angina pectoris: Secondary | ICD-10-CM | POA: Diagnosis present

## 2021-11-05 DIAGNOSIS — I5022 Chronic systolic (congestive) heart failure: Secondary | ICD-10-CM | POA: Diagnosis present

## 2021-11-05 DIAGNOSIS — Z955 Presence of coronary angioplasty implant and graft: Secondary | ICD-10-CM

## 2021-11-05 DIAGNOSIS — I252 Old myocardial infarction: Secondary | ICD-10-CM | POA: Diagnosis not present

## 2021-11-05 DIAGNOSIS — Z91048 Other nonmedicinal substance allergy status: Secondary | ICD-10-CM | POA: Diagnosis not present

## 2021-11-05 DIAGNOSIS — Z6841 Body Mass Index (BMI) 40.0 and over, adult: Secondary | ICD-10-CM | POA: Diagnosis not present

## 2021-11-05 DIAGNOSIS — I472 Ventricular tachycardia, unspecified: Secondary | ICD-10-CM | POA: Diagnosis present

## 2021-11-05 DIAGNOSIS — Z20822 Contact with and (suspected) exposure to covid-19: Secondary | ICD-10-CM | POA: Diagnosis present

## 2021-11-05 DIAGNOSIS — Z951 Presence of aortocoronary bypass graft: Secondary | ICD-10-CM | POA: Diagnosis not present

## 2021-11-05 DIAGNOSIS — Z7982 Long term (current) use of aspirin: Secondary | ICD-10-CM

## 2021-11-05 DIAGNOSIS — Z7901 Long term (current) use of anticoagulants: Secondary | ICD-10-CM

## 2021-11-05 DIAGNOSIS — Z8249 Family history of ischemic heart disease and other diseases of the circulatory system: Secondary | ICD-10-CM

## 2021-11-05 DIAGNOSIS — R0902 Hypoxemia: Secondary | ICD-10-CM | POA: Diagnosis not present

## 2021-11-05 DIAGNOSIS — Z6834 Body mass index (BMI) 34.0-34.9, adult: Secondary | ICD-10-CM

## 2021-11-05 HISTORY — PX: INTRAVASCULAR ULTRASOUND/IVUS: CATH118244

## 2021-11-05 HISTORY — PX: CORONARY STENT INTERVENTION: CATH118234

## 2021-11-05 HISTORY — PX: LEFT HEART CATH AND CORS/GRAFTS ANGIOGRAPHY: CATH118250

## 2021-11-05 LAB — RESP PANEL BY RT-PCR (FLU A&B, COVID) ARPGX2
Influenza A by PCR: NEGATIVE
Influenza B by PCR: NEGATIVE
SARS Coronavirus 2 by RT PCR: NEGATIVE

## 2021-11-05 LAB — COMPREHENSIVE METABOLIC PANEL
ALT: 17 U/L (ref 0–44)
AST: 23 U/L (ref 15–41)
Albumin: 3.8 g/dL (ref 3.5–5.0)
Alkaline Phosphatase: 65 U/L (ref 38–126)
Anion gap: 6 (ref 5–15)
BUN: 13 mg/dL (ref 8–23)
CO2: 23 mmol/L (ref 22–32)
Calcium: 8.5 mg/dL — ABNORMAL LOW (ref 8.9–10.3)
Chloride: 106 mmol/L (ref 98–111)
Creatinine, Ser: 0.7 mg/dL (ref 0.44–1.00)
GFR, Estimated: >60 mL/min (ref >60)
Glucose, Bld: 106 mg/dL — ABNORMAL HIGH (ref 70–99)
Potassium: 3.7 mmol/L (ref 3.5–5.1)
Sodium: 135 mmol/L (ref 135–145)
Total Bilirubin: 0.1 mg/dL — ABNORMAL LOW (ref 0.3–1.2)
Total Protein: 6.8 g/dL (ref 6.5–8.1)

## 2021-11-05 LAB — CBC WITH DIFFERENTIAL/PLATELET
Abs Immature Granulocytes: 0.03 10*3/uL (ref 0.00–0.07)
Basophils Absolute: 0.1 10*3/uL (ref 0.0–0.1)
Basophils Relative: 1 %
Eosinophils Absolute: 0.1 10*3/uL (ref 0.0–0.5)
Eosinophils Relative: 1 %
HCT: 28.5 % — ABNORMAL LOW (ref 36.0–46.0)
Hemoglobin: 9.1 g/dL — ABNORMAL LOW (ref 12.0–15.0)
Immature Granulocytes: 0 %
Lymphocytes Relative: 8 %
Lymphs Abs: 0.7 10*3/uL (ref 0.7–4.0)
MCH: 30.1 pg (ref 26.0–34.0)
MCHC: 31.9 g/dL (ref 30.0–36.0)
MCV: 94.4 fL (ref 80.0–100.0)
Monocytes Absolute: 0.6 10*3/uL (ref 0.1–1.0)
Monocytes Relative: 7 %
Neutro Abs: 7.1 10*3/uL (ref 1.7–7.7)
Neutrophils Relative %: 83 %
Platelets: 296 10*3/uL (ref 150–400)
RBC: 3.02 MIL/uL — ABNORMAL LOW (ref 3.87–5.11)
RDW: 13.2 % (ref 11.5–15.5)
WBC: 8.5 10*3/uL (ref 4.0–10.5)
nRBC: 0 % (ref 0.0–0.2)

## 2021-11-05 LAB — PROTIME-INR
INR: 0.9 (ref 0.8–1.2)
Prothrombin Time: 11.8 seconds (ref 11.4–15.2)

## 2021-11-05 LAB — BRAIN NATRIURETIC PEPTIDE: B Natriuretic Peptide: 547 pg/mL — ABNORMAL HIGH (ref 0.0–100.0)

## 2021-11-05 LAB — TROPONIN I (HIGH SENSITIVITY)
Troponin I (High Sensitivity): 158 ng/L (ref <18)
Troponin I (High Sensitivity): 210 ng/L (ref <18)

## 2021-11-05 LAB — POCT ACTIVATED CLOTTING TIME
Activated Clotting Time: 263 seconds
Activated Clotting Time: 371 seconds
Activated Clotting Time: 546 seconds

## 2021-11-05 LAB — MAGNESIUM: Magnesium: 2.1 mg/dL (ref 1.7–2.4)

## 2021-11-05 LAB — APTT: aPTT: 29 seconds (ref 24–36)

## 2021-11-05 LAB — HEPARIN LEVEL (UNFRACTIONATED): Heparin Unfractionated: 0.49 IU/mL (ref 0.30–0.70)

## 2021-11-05 SURGERY — LEFT HEART CATH AND CORS/GRAFTS ANGIOGRAPHY
Anesthesia: LOCAL

## 2021-11-05 MED ORDER — NITROGLYCERIN 1 MG/10 ML FOR IR/CATH LAB
INTRA_ARTERIAL | Status: AC
  Filled 2021-11-05: qty 10

## 2021-11-05 MED ORDER — VERAPAMIL HCL 2.5 MG/ML IV SOLN
INTRAVENOUS | Status: DC | PRN
  Administered 2021-11-05: 10 mL via INTRA_ARTERIAL

## 2021-11-05 MED ORDER — MEXILETINE HCL 150 MG PO CAPS
150.0000 mg | ORAL_CAPSULE | Freq: Three times a day (TID) | ORAL | Status: DC
  Administered 2021-11-05 – 2021-11-07 (×5): 150 mg via ORAL
  Filled 2021-11-05 (×12): qty 1

## 2021-11-05 MED ORDER — HEPARIN SODIUM (PORCINE) 1000 UNIT/ML IJ SOLN
INTRAMUSCULAR | Status: AC
  Filled 2021-11-05: qty 10

## 2021-11-05 MED ORDER — LEVETIRACETAM 250 MG PO TABS
250.0000 mg | ORAL_TABLET | Freq: Two times a day (BID) | ORAL | Status: DC
  Administered 2021-11-05 – 2021-11-07 (×4): 250 mg via ORAL
  Filled 2021-11-05 (×4): qty 1

## 2021-11-05 MED ORDER — NITROGLYCERIN IN D5W 200-5 MCG/ML-% IV SOLN
5.0000 ug/min | INTRAVENOUS | Status: DC
  Administered 2021-11-05 (×2): 5 ug/min via INTRAVENOUS
  Filled 2021-11-05: qty 250

## 2021-11-05 MED ORDER — ACETAMINOPHEN 325 MG PO TABS
650.0000 mg | ORAL_TABLET | Freq: Once | ORAL | Status: DC
  Filled 2021-11-05: qty 2

## 2021-11-05 MED ORDER — FENTANYL CITRATE PF 50 MCG/ML IJ SOSY
25.0000 ug | PREFILLED_SYRINGE | Freq: Once | INTRAMUSCULAR | Status: AC
  Administered 2021-11-05: 25 ug via INTRAVENOUS
  Filled 2021-11-05: qty 1

## 2021-11-05 MED ORDER — DIPHENHYDRAMINE HCL 25 MG PO CAPS: 25.0000 mg | ORAL_CAPSULE | Freq: Once | ORAL | Status: DC | PRN

## 2021-11-05 MED ORDER — OXYCODONE-ACETAMINOPHEN 5-325 MG PO TABS
1.0000 | ORAL_TABLET | Freq: Once | ORAL | Status: AC
Start: 2021-11-05 — End: 2021-11-05
  Administered 2021-11-05: 1 via ORAL
  Filled 2021-11-05: qty 1

## 2021-11-05 MED ORDER — ALBUTEROL SULFATE (2.5 MG/3ML) 0.083% IN NEBU: 3.0000 mL | INHALATION_SOLUTION | Freq: Four times a day (QID) | RESPIRATORY_TRACT | Status: DC | PRN

## 2021-11-05 MED ORDER — HYDRALAZINE HCL 20 MG/ML IJ SOLN: 10.0000 mg | INTRAMUSCULAR | Status: AC | PRN

## 2021-11-05 MED ORDER — ACETAMINOPHEN 325 MG PO TABS
650.0000 mg | ORAL_TABLET | ORAL | Status: DC | PRN
  Administered 2021-11-06 – 2021-11-07 (×5): 650 mg via ORAL
  Filled 2021-11-05 (×5): qty 2

## 2021-11-05 MED ORDER — EZETIMIBE 10 MG PO TABS
10.0000 mg | ORAL_TABLET | Freq: Every day | ORAL | Status: DC
  Administered 2021-11-05 – 2021-11-07 (×3): 10 mg via ORAL
  Filled 2021-11-05 (×3): qty 1

## 2021-11-05 MED ORDER — SODIUM CHLORIDE 0.9% FLUSH
3.0000 mL | Freq: Two times a day (BID) | INTRAVENOUS | Status: DC
  Administered 2021-11-06 – 2021-11-07 (×4): 3 mL via INTRAVENOUS

## 2021-11-05 MED ORDER — OMEGA 3 1000 MG PO CAPS: 1.0000 | ORAL_CAPSULE | Freq: Two times a day (BID) | ORAL | Status: DC

## 2021-11-05 MED ORDER — ACETAMINOPHEN 325 MG PO TABS
650.0000 mg | ORAL_TABLET | Freq: Four times a day (QID) | ORAL | Status: DC | PRN
  Administered 2021-11-05: 650 mg via ORAL

## 2021-11-05 MED ORDER — SODIUM CHLORIDE 0.9 % IV SOLN
INTRAVENOUS | Status: DC
  Administered 2021-11-05: 100 mL/h via INTRAVENOUS

## 2021-11-05 MED ORDER — ONDANSETRON HCL 4 MG/2ML IJ SOLN: 4.0000 mg | Freq: Four times a day (QID) | INTRAMUSCULAR | Status: DC | PRN

## 2021-11-05 MED ORDER — ASPIRIN 81 MG PO CHEW
324.0000 mg | CHEWABLE_TABLET | Freq: Once | ORAL | Status: AC
  Administered 2021-11-05: 324 mg via ORAL
  Filled 2021-11-05: qty 4

## 2021-11-05 MED ORDER — ONDANSETRON HCL 4 MG/2ML IJ SOLN
4.0000 mg | Freq: Four times a day (QID) | INTRAMUSCULAR | Status: DC | PRN
  Administered 2021-11-06: 4 mg via INTRAVENOUS
  Filled 2021-11-05: qty 2

## 2021-11-05 MED ORDER — HEPARIN (PORCINE) 25000 UT/250ML-% IV SOLN
900.0000 [IU]/h | INTRAVENOUS | Status: DC
  Administered 2021-11-05: 900 [IU]/h via INTRAVENOUS
  Filled 2021-11-05: qty 250

## 2021-11-05 MED ORDER — ALPRAZOLAM 0.5 MG PO TABS
0.5000 mg | ORAL_TABLET | Freq: Once | ORAL | Status: AC
  Administered 2021-11-05: 0.5 mg via ORAL
  Filled 2021-11-05: qty 1

## 2021-11-05 MED ORDER — FENTANYL CITRATE (PF) 100 MCG/2ML IJ SOLN
INTRAMUSCULAR | Status: AC
  Filled 2021-11-05: qty 2

## 2021-11-05 MED ORDER — VERAPAMIL HCL 2.5 MG/ML IV SOLN
INTRAVENOUS | Status: AC
  Filled 2021-11-05: qty 2

## 2021-11-05 MED ORDER — FENTANYL CITRATE (PF) 100 MCG/2ML IJ SOLN
INTRAMUSCULAR | Status: DC | PRN
  Administered 2021-11-05 (×3): 25 ug via INTRAVENOUS

## 2021-11-05 MED ORDER — SODIUM CHLORIDE 0.9% FLUSH: 3.0000 mL | INTRAVENOUS | Status: DC | PRN

## 2021-11-05 MED ORDER — FAMOTIDINE IN NACL 20-0.9 MG/50ML-% IV SOLN
INTRAVENOUS | Status: AC
  Filled 2021-11-05: qty 50

## 2021-11-05 MED ORDER — ASPIRIN 81 MG PO CHEW: 81.0000 mg | CHEWABLE_TABLET | Freq: Every day | ORAL | Status: DC

## 2021-11-05 MED ORDER — SACUBITRIL-VALSARTAN 24-26 MG PO TABS
1.0000 | ORAL_TABLET | Freq: Two times a day (BID) | ORAL | Status: DC
  Administered 2021-11-05 – 2021-11-07 (×4): 1 via ORAL
  Filled 2021-11-05 (×4): qty 1

## 2021-11-05 MED ORDER — CLOPIDOGREL BISULFATE 300 MG PO TABS
ORAL_TABLET | ORAL | Status: AC
  Filled 2021-11-05: qty 2

## 2021-11-05 MED ORDER — PANTOPRAZOLE SODIUM 40 MG PO TBEC
40.0000 mg | DELAYED_RELEASE_TABLET | Freq: Every day | ORAL | Status: DC
  Administered 2021-11-06 – 2021-11-07 (×2): 40 mg via ORAL
  Filled 2021-11-05 (×2): qty 1

## 2021-11-05 MED ORDER — MIDAZOLAM HCL 2 MG/2ML IJ SOLN
INTRAMUSCULAR | Status: DC | PRN
  Administered 2021-11-05 (×2): 1 mg via INTRAVENOUS
  Administered 2021-11-05: 2 mg via INTRAVENOUS

## 2021-11-05 MED ORDER — GUAIFENESIN-DM 100-10 MG/5ML PO SYRP
5.0000 mL | ORAL_SOLUTION | ORAL | Status: DC | PRN
  Administered 2021-11-05: 5 mL via ORAL
  Filled 2021-11-05: qty 5

## 2021-11-05 MED ORDER — METOPROLOL SUCCINATE ER 25 MG PO TB24
25.0000 mg | ORAL_TABLET | Freq: Every day | ORAL | Status: DC
  Administered 2021-11-05 – 2021-11-07 (×3): 25 mg via ORAL
  Filled 2021-11-05 (×3): qty 1

## 2021-11-05 MED ORDER — SODIUM CHLORIDE 0.9 % IV SOLN: 250.0000 mL | INTRAVENOUS | Status: DC | PRN

## 2021-11-05 MED ORDER — SODIUM CHLORIDE 0.9 % IV SOLN: INTRAVENOUS | Status: AC

## 2021-11-05 MED ORDER — MIDAZOLAM HCL 2 MG/2ML IJ SOLN
INTRAMUSCULAR | Status: AC
  Filled 2021-11-05: qty 2

## 2021-11-05 MED ORDER — NITROGLYCERIN 1 MG/10 ML FOR IR/CATH LAB
INTRA_ARTERIAL | Status: DC | PRN
  Administered 2021-11-05: 400 ug via INTRA_ARTERIAL
  Administered 2021-11-05: 200 ug via INTRACORONARY

## 2021-11-05 MED ORDER — CLOPIDOGREL BISULFATE 75 MG PO TABS
75.0000 mg | ORAL_TABLET | Freq: Every day | ORAL | Status: DC
  Administered 2021-11-06 – 2021-11-07 (×2): 75 mg via ORAL
  Filled 2021-11-05 (×2): qty 1

## 2021-11-05 MED ORDER — FAMOTIDINE IN NACL 20-0.9 MG/50ML-% IV SOLN
INTRAVENOUS | Status: DC | PRN
  Administered 2021-11-05: 20 mg via INTRAVENOUS

## 2021-11-05 MED ORDER — FENTANYL CITRATE PF 50 MCG/ML IJ SOSY
50.0000 ug | PREFILLED_SYRINGE | Freq: Once | INTRAMUSCULAR | Status: AC
  Administered 2021-11-05: 50 ug via INTRAVENOUS
  Filled 2021-11-05: qty 1

## 2021-11-05 MED ORDER — ACETAMINOPHEN 325 MG PO TABS: 650.0000 mg | ORAL_TABLET | ORAL | Status: DC | PRN

## 2021-11-05 MED ORDER — HEPARIN SODIUM (PORCINE) 1000 UNIT/ML IJ SOLN
INTRAMUSCULAR | Status: DC | PRN
  Administered 2021-11-05: 6000 [IU] via INTRAVENOUS
  Administered 2021-11-05: 3000 [IU] via INTRAVENOUS
  Administered 2021-11-05: 5000 [IU] via INTRAVENOUS

## 2021-11-05 MED ORDER — NITROGLYCERIN 2 % TD OINT
1.0000 [in_us] | TOPICAL_OINTMENT | Freq: Once | TRANSDERMAL | Status: AC
  Administered 2021-11-05: 1 [in_us] via TOPICAL
  Filled 2021-11-05: qty 1

## 2021-11-05 MED ORDER — HEPARIN (PORCINE) IN NACL 1000-0.9 UT/500ML-% IV SOLN
INTRAVENOUS | Status: AC
  Filled 2021-11-05: qty 1000

## 2021-11-05 MED ORDER — NITROGLYCERIN 0.4 MG SL SUBL
0.4000 mg | SUBLINGUAL_TABLET | SUBLINGUAL | Status: DC | PRN
  Administered 2021-11-06: 0.4 mg via SUBLINGUAL
  Filled 2021-11-05: qty 1

## 2021-11-05 MED ORDER — IOHEXOL 350 MG/ML SOLN
INTRAVENOUS | Status: DC | PRN
  Administered 2021-11-05: 175 mL via INTRA_ARTERIAL

## 2021-11-05 MED ORDER — LIDOCAINE HCL (PF) 1 % IJ SOLN
INTRAMUSCULAR | Status: AC
  Filled 2021-11-05: qty 30

## 2021-11-05 MED ORDER — LEVOTHYROXINE SODIUM 75 MCG PO TABS
75.0000 ug | ORAL_TABLET | Freq: Every day | ORAL | Status: DC
  Administered 2021-11-06 – 2021-11-07 (×2): 75 ug via ORAL
  Filled 2021-11-05 (×2): qty 1

## 2021-11-05 MED ORDER — HEPARIN (PORCINE) IN NACL 1000-0.9 UT/500ML-% IV SOLN
INTRAVENOUS | Status: DC | PRN
  Administered 2021-11-05 (×2): 500 mL

## 2021-11-05 MED ORDER — SODIUM CHLORIDE 0.9% FLUSH
3.0000 mL | Freq: Two times a day (BID) | INTRAVENOUS | Status: DC
  Administered 2021-11-06 – 2021-11-07 (×2): 3 mL via INTRAVENOUS

## 2021-11-05 MED ORDER — LIDOCAINE HCL (PF) 1 % IJ SOLN
INTRAMUSCULAR | Status: DC | PRN
  Administered 2021-11-05: 2 mL via INTRADERMAL

## 2021-11-05 MED ORDER — ROSUVASTATIN CALCIUM 5 MG PO TABS
5.0000 mg | ORAL_TABLET | Freq: Every day | ORAL | Status: DC
  Administered 2021-11-05 – 2021-11-06 (×2): 5 mg via ORAL
  Filled 2021-11-05 (×2): qty 1

## 2021-11-05 MED ORDER — CLOPIDOGREL BISULFATE 75 MG PO TABS
ORAL_TABLET | ORAL | Status: DC | PRN
  Administered 2021-11-05: 600 mg via ORAL

## 2021-11-05 MED ORDER — LABETALOL HCL 5 MG/ML IV SOLN: 10.0000 mg | INTRAVENOUS | Status: AC | PRN

## 2021-11-05 SURGICAL SUPPLY — 29 items
BALLN SAPPHIRE 2.5X12 (BALLOONS) ×2
BALLN WOLVERINE 3.50X15 (BALLOONS) ×2
BALLN ~~LOC~~ SAPPHIRE 4.5X8 (BALLOONS) ×2
BALLOON SAPPHIRE 2.5X12 (BALLOONS) IMPLANT
BALLOON WOLVERINE 3.50X15 (BALLOONS) IMPLANT
BALLOON ~~LOC~~ SAPPHIRE 4.5X8 (BALLOONS) IMPLANT
CATH EXPO 5F MPA-1 (CATHETERS) ×1 IMPLANT
CATH INFINITI 5 FR IM (CATHETERS) ×1 IMPLANT
CATH INFINITI 5 FR LCB (CATHETERS) ×1 IMPLANT
CATH INFINITI 5FR MULTPACK ANG (CATHETERS) ×1 IMPLANT
CATH LAUNCHER 6FR EBU3.5 (CATHETERS) ×1 IMPLANT
CATH OPTICROSS HD (CATHETERS) ×1 IMPLANT
DEVICE RAD COMP TR BAND LRG (VASCULAR PRODUCTS) ×1 IMPLANT
GLIDESHEATH SLEND SS 6F .021 (SHEATH) ×1 IMPLANT
GUIDEWIRE INQWIRE 1.5J.035X260 (WIRE) IMPLANT
INQWIRE 1.5J .035X260CM (WIRE) ×2
KIT ENCORE 26 ADVANTAGE (KITS) ×1 IMPLANT
KIT HEART LEFT (KITS) ×2 IMPLANT
PACK CARDIAC CATHETERIZATION (CUSTOM PROCEDURE TRAY) ×2 IMPLANT
SHEATH PROBE COVER 6X72 (BAG) ×1 IMPLANT
SLED PULL BACK IVUS (MISCELLANEOUS) ×1 IMPLANT
STENT SYNERGY XD 3.50X16 (Permanent Stent) IMPLANT
SYNERGY XD 3.50X16 (Permanent Stent) ×2 IMPLANT
SYR MEDRAD MARK 7 150ML (SYRINGE) ×2 IMPLANT
TRANSDUCER W/STOPCOCK (MISCELLANEOUS) ×2 IMPLANT
TUBING CIL FLEX 10 FLL-RA (TUBING) ×2 IMPLANT
VALVE COPILOT STAT (MISCELLANEOUS) ×1 IMPLANT
WIRE ASAHI FIELDER XT 300CM (WIRE) ×1 IMPLANT
WIRE ASAHI PROWATER 180CM (WIRE) ×1 IMPLANT

## 2021-11-05 NOTE — ED Notes (Signed)
Wyline Mood, MD at bedside

## 2021-11-05 NOTE — Progress Notes (Signed)
Patient transferred from cath lab at 1734hrs.  Left wrist with TR band, noted to have hematoma.  Cath lab staff at bedside and holding pressure, TR band repositioned.  Patient c/o 4/10 chest pain, sharp, radiating to right shoulder.  Placed on oxygen 4L [place] and patient states pain has eased to 2/10.  Dr. Irish Lack notified of chest pain by cath lab staff.  Reviewed post cath instructions with patient.

## 2021-11-05 NOTE — Interval H&P Note (Signed)
Cath Lab Visit (complete for each Cath Lab visit)  Clinical Evaluation Leading to the Procedure:   ACS: Yes.    Non-ACS:    Anginal Classification: CCS IV  Anti-ischemic medical therapy: Minimal Therapy (1 class of medications)  Non-Invasive Test Results: No non-invasive testing performed  Prior CABG: Previous CABG   NSTEMI- persistent chest pain   History and Physical Interval Note:  11/05/2021 3:40 PM  [name]  has presented today for surgery, with the diagnosis of vt - nstemi.  The various methods of treatment have been discussed with the patient and family. After consideration of risks, benefits and other options for treatment, the patient has consented to  Procedure(s): LEFT HEART CATH AND CORS/GRAFTS ANGIOGRAPHY (N/A) as a surgical intervention.  The patient's history has been reviewed, patient examined, no change in status, stable for surgery.  I have reviewed the patient's chart and labs.  Questions were answered to the patient's satisfaction.     Lance Muss

## 2021-11-05 NOTE — H&P (Addendum)
Cardiology Admission History and Physical:   Patient ID: [name] MRN: [id number]; DOB: 07/26/1956   Admission date: 11/05/2021  PCP:  Arnoldo Lenis, MD   [facility] Providers Cardiologist:  Carlyle Dolly, MD   Chief Complaint: Chest Pain  Patient Profile:   [name] is a 66 y.o. female with with past medical history of CAD (s/p CABG in 2005 with multiple interventions since, catheterization in 05/2015 showing occluded SVG-LAD and SVG-LCx with patent LIMA-D1 and patent SVG-RCA with no anatomy amenable to PCI, no significant ischemia by NST in 06/2018), HFrEF (EF 35-40% by cath in 2016, at 55-60% in 06/2018, 40-45% in 07/2021 and 45-50% in 08/2021), PVC's (on Mexiletine), persistent atrial fibrillation (s/p DCCV in 07/2021) HTN, HLD, carotid artery stenosis, and COPD who is being seen 11/05/2021 for the evaluation of NSTEMI at the request of Dr. Doren Custard.  History of Present Illness:   [name] was most recently admitted [place] in 08/2021 for hypovolemic shock with infectious work-up being negative. Symptoms and BP significantly improved with IVF and BP medications were adjusted during admission. Was restarted on Toprol-XL 25mg  daily, Entresto 24-26mg  BID and PRN Lasix with PTA Imdur being discontinued.   She presented to [facility] ED on 11/05/2021 for evaluation of chest pain for the past 4 days. She reports having progressive chest pain for the past week which was a cramping sensation along her chest and she would take SL NTG with resolution of her symptoms. Last night, she developed worsening discomfort along her left pectoral region and took SL NTG throughout the night with brief improvement in pain but symptoms would return. Did not sleep due to her pain. Reports associated dyspnea. No specific orthopnea, PND or pitting edema. Last doses of her medications were at 2000 on 11/04/2021 and she did not take anything this AM, including Eliquis.   Initial labs show WBC 8.5,  Hgb 9.1 (close to baseline when compared to prior values from 08/2021). Na+ 135, K+ 3.7 and creatinine 0.70. Mg 2.1.  BNP 547. Initial Hs Troponin 158 with repeat values pending. COVID pending. CXR with no acute findings. EKG shows NSR, HR 90 with PVC's and IVCD. On telemetry, she has been in NSR with frequent PVC's and couplets and brief NSVT with the longest being 8 beats.   IV Heparin has been ordered (not yet started) and she did receive ASA and Fentanyl. Was on NTG patch and eventually started on IV NTG but has developed a significant headache since this was started and is crying during the encounter due to her headache. Still with some chest discomfort as well which she describes as a cramp.   Past Medical History:  Diagnosis Date   Acute renal failure (HCC)    Anemia    Anticoagulation adequate 08/15/2021   Anxiety    Arthritis    Asthma    Carotid artery disease ([place])    a. Duplex 50-69% BICA 03/2014.   Chronic back pain    Collagen vascular disease (HCC)    COPD (chronic obstructive pulmonary disease) (HCC)    Coronary artery disease    a. s/p prior CABG 2005. b. BMS 02/2009 to VG-PDA. c. NSTEMI 08/2010 s/p PTCA/DES to SVG-PDA in previously stented area. d. DES to SVG to RCA on 05/24/14  c. LHC 06/04/2015 w/ no sig change in her anatomy and Rx managment   DDD (degenerative disc disease)    Depression    Diverticulitis    4 documented episodes by  CT; most recent April 2012   DIVERTICULITIS OF COLON 07/14/2010   Qualifier: Diagnosis of  By: Westly Pam.    GERD (gastroesophageal reflux disease)    History of blood transfusion    HTN (hypertension)    Hyperlipidemia    Hypertension    Hypothyroidism    Ischemic cardiomyopathy    a. EF 40% in 2012 by cath. b. improved to 50-55% by cath 05/2014  c. EF 35-40% by [facility] 05/2015   Kidney stones    Persistent atrial fibrillation ([place]) with DCCV 07/30/21 maintaining SR 08/15/2021   Seizures ([place])    Shock ([place]) hypovolemic  08/13/2021   Sinus bradycardia    Sleep apnea    a. Mild-to-moderate obstructive sleep apnea diagnosed in April 2012 by sleep study.   Tension headache     Past Surgical History:  Procedure Laterality Date   APPENDECTOMY  ~ [place]  Jan 2012   patent stents in RCA graft; CAD stable   CARDIAC CATHETERIZATION N/A 06/03/2015   Procedure: Left Heart Cath and Cors/Grafts Angiography;  Surgeon: Belva Crome, MD;  Location: [place] [facility];  Service: Cardiovascular;  Laterality: N/A;   CARPAL TUNNEL WITH CUBITAL TUNNEL Right 2001   CORONARY ANGIOPLASTY WITH STENT PLACEMENT  2011   Promus Premier DES, 3.5 mm x 12 mm, for 99% proximal stent ISR in the SVG-RPDA    CORONARY ANGIOPLASTY WITH STENT PLACEMENT  05/24/2014   CORONARY ARTERY BYPASS GRAFT  2005   LIMA to AL, SVG to LAD, SVG to Cx, SVG-PDA   KNEE ARTHROSCOPY W/ ACL RECONSTRUCTION Left 2001   LAPAROSCOPIC CHOLECYSTECTOMY  2010   LEFT HEART CATHETERIZATION WITH CORONARY/GRAFT ANGIOGRAM N/A 05/24/2014   Procedure: LEFT HEART CATHETERIZATION WITH Beatrix Fetters;  Surgeon: Leonie Man, MD; EF 50-55%, LAD & D1 100%, LIMA-D1 OK, CFX OK, OM1 & OM2 100%, lat OM Dare Sanger 100%, RCA 100%; pSVG-RPDA 99% rx w/ scoring balloon PTCA & 3.5 x 12 mm Promus DES; SVG-LAD, SVG-OM known occluded       TUBAL LIGATION  ~ [place]  ~ 1993     Medications Prior to Admission: Prior to Admission medications   Medication Sig Start Date End Date Taking? Authorizing Provider  nitroGLYCERIN (NITROSTAT) 0.4 MG SL tablet DISSOLVE 1 TABLET UNDER TONGUE EVERY 5 MINUTES UP TO 15 MIN FOR CHESTPAIN. IF NO RELIEF CALL 911. 06/16/21  Yes Verta Ellen., NP  acetaminophen (TYLENOL) 650 MG CR tablet Take 1,300 mg by mouth every 8 (eight) hours as needed for pain.    [provider]  albuterol (VENTOLIN HFA) 108 (90 Base) MCG/ACT inhaler Inhale 2 puffs into the lungs every 6 (six) hours as needed for wheezing or  shortness of breath.    [provider]  apixaban (ELIQUIS) 5 MG TABS tablet Take 1 tablet (5 mg total) by mouth 2 (two) times daily. 06/16/21   Verta Ellen., NP  aspirin 81 MG chewable tablet Chew 1 tablet (81 mg total) by mouth daily. 08/16/21   Isaiah Serge, NP  cetirizine (ZYRTEC ALLERGY) 10 MG tablet Take 1 tablet (10 mg total) by mouth daily. 06/05/20   Avegno, Darrelyn Hillock, FNP  ezetimibe (ZETIA) 10 MG tablet TAKE (1) TABLET BY MOUTH ONCE DAILY. Patient taking differently: Take 10 mg by mouth daily. 02/28/21   Arnoldo Lenis, MD  furosemide (LASIX) 80 MG tablet Take 1 tablet (80 mg total) by mouth daily  as needed for edema (we changed to if needed because you were dehydrated). 08/15/21   Isaiah Serge, NP  hydrOXYzine (ATARAX/VISTARIL) 25 MG tablet Take 1 tablet (25 mg total) by mouth every 6 (six) hours as needed for anxiety. 08/18/21   Arnoldo Lenis, MD  levETIRAcetam (KEPPRA) 250 MG tablet TAKE (1) TABLET BY MOUTH TWICE DAILY. 09/25/21   Lindell Spar, MD  levothyroxine (SYNTHROID) 75 MCG tablet Take 1 tablet (75 mcg total) by mouth daily before breakfast. 06/16/21   Verta Ellen., NP  metoprolol succinate (TOPROL-XL) 25 MG 24 hr tablet Take 1 tablet (25 mg total) by mouth daily. 08/16/21   Isaiah Serge, NP  metoprolol succinate (TOPROL-XL) 50 MG 24 hr tablet TAKE (1) TABLET BY MOUTH ONCE DAILY. TAKE WITH OR IMMEDIATELY FOLLOWING A MEAL. 09/17/21   Arnoldo Lenis, MD  mexiletine (MEXITIL) 150 MG capsule Take 1 capsule (150 mg total) by mouth every 8 (eight) hours. 08/15/21   Isaiah Serge, NP  Multiple Vitamin (MULTIVITAMIN WITH MINERALS) TABS tablet Take 1 tablet by mouth 2 (two) times daily. Patient taking differently: Take 1 tablet by mouth daily. 08/28/13   Arnoldo Lenis, MD  Omega 3 1000 MG CAPS Take 1 capsule (1,000 mg total) by mouth 2 (two) times daily. 08/27/20   Verta Ellen., NP  omeprazole (PRILOSEC) 20 MG capsule TAKE (1)  CAPSULE BY MOUTH ONCE DAILY. 11/03/21   Arnoldo Lenis, MD  potassium chloride SA (KLOR-CON) 20 MEQ tablet Take 1 tablet (20 mEq total) by mouth daily as needed. 08/15/21   Isaiah Serge, NP  rosuvastatin (CRESTOR) 5 MG tablet Take 1 tablet (5 mg total) by mouth daily. 07/25/20   Verta Ellen., NP  sacubitril-valsartan (ENTRESTO) 24-26 MG Take 1 tablet by mouth 2 (two) times daily. 09/19/21   Arnoldo Lenis, MD     Allergies:    Allergies  Allergen Reactions   Pneumococcal Vaccines Anxiety, Palpitations and Cough   Adhesive [Tape] Itching and Other (See Comments)    Blisters (pls use paper tape)   Crestor [Rosuvastatin] Other (See Comments)    Muscle pain   Lipitor [Atorvastatin] Other (See Comments)    Cramps   Morphine And Related Itching    Pt states she takes with benadryl    Social History:   Social History   Socioeconomic History   Marital status: Legally Separated    Spouse name: Not on file   Number of children: 3   Years of education: Not on file   Highest education level: Not on file  Occupational History   Occupation: disabled  Tobacco Use   Smoking status: Former    Packs/day: 0.50    Years: 21.00    Pack years: 10.50    Types: Cigarettes    Start date: 03/19/1993    Quit date: 07/29/2015    Years since quitting: 6.2   Smokeless tobacco: Never   Tobacco comments:    smokes some time/go weeks without smoking  Vaping Use   Vaping Use: Never used  Substance and Sexual Activity   Alcohol use: No    Alcohol/week: 0.0 standard drinks   Drug use: Not Currently    Types: Cocaine, Marijuana    Comment: last month   Sexual activity: Never  Other Topics Concern   Not on file  Social History Narrative   Lives in [place], [place].    Social Determinants of Radio broadcast assistant  Strain: Not on file  Food Insecurity: Not on file  Transportation Needs: Not on file  Physical Activity: Not on file  Stress: Not on file  Social Connections: Not on  file  Intimate Partner Violence: Not on file    Family History:   The patient's family history includes Breast cancer in her mother; Heart attack in her father and mother; Heart disease in her father and mother. There is no history of Colon cancer.    ROS:  Please see the history of present illness.  All other ROS reviewed and negative.     Physical Exam/Data:   Vitals:   11/05/21 1115 11/05/21 1130 11/05/21 1145 11/05/21 1200  BP: 122/69 (!) 137/92 139/82 (!) 152/73  Pulse: 64 69 77 71  Resp: 13 14 15 20   Temp:      TempSrc:      SpO2: 95% 98% 100% 97%  Weight:      Height:       No intake or output data in the 24 hours ending 11/05/21 1254 Last 3 Weights 11/05/2021 08/15/2021 08/14/2021  Weight (lbs) 192 lb 192 lb 0.3 oz 189 lb 9.5 oz  Weight (kg) 87.091 kg 87.1 kg 86 kg     Body mass index is 34.01 kg/m.  General: Female, tearful during encounter and grabbing at her head throughout.  HEENT: normal Neck: no JVD.  Vascular: No carotid bruits; Distal pulses 2+ bilaterally   Cardiac:  normal S1, S2; RRR with PVC's.  Lungs:  clear to auscultation bilaterally, no wheezing, rhonchi or rales  Abd: soft, nontender, no hepatomegaly  Ext: no pitting edema Musculoskeletal:  No deformities, BUE and BLE strength normal and equal Skin: warm and dry  Neuro:  CNs 2-12 intact, no focal abnormalities noted Psych:  Normal affect    EKG:  The ECG that was done was personally reviewed and demonstrates NSR, HR 90 with PVC's and IVCD.   Relevant CV Studies:  Cardiac Catheterization: 05/2015 Mid Graft lesion, 30% stenosed. The lesion was previously treated with a stent (unknown type) .   Bypass graft failure with occlusion of the saphenous vein graft to the left anterior descending and the circumflex coronary artery. Patent left internal mammary graft to the diagonal and patent saphenous vein graft to the distal right coronary. The stents within the mid to distal body of the right  coronary saphenous vein graft or patent (multiple layers of stent within this region are noted). Severe native vessel coronary disease with total occlusion of the proximal RCA, total occlusion of the left anterior descending and total occlusion of the mid circumflex. The first obtuse marginal supplies collaterals to the distal circumflex. Left ventricular dysfunction with moderate inferior wall hypokinesis and overall ejection fraction of 35-40%.     RECOMMENDATIONS:   Continued aggressive risk factor modification No anatomy amenable to percutaneous intervention.  Laboratory Data:  High Sensitivity Troponin:   Recent Labs  Lab 11/05/21 0912 11/05/21 1057  TROPONINIHS 158* 210*      Chemistry Recent Labs  Lab 11/05/21 0912  NA 135  K 3.7  CL 106  CO2 23  GLUCOSE 106*  BUN 13  CREATININE 0.70  CALCIUM 8.5*  MG 2.1  GFRNONAA >60  ANIONGAP 6    Recent Labs  Lab 11/05/21 0912  PROT 6.8  ALBUMIN 3.8  AST 23  ALT 17  ALKPHOS 65  BILITOT 0.1*   Lipids No results for input(s): CHOL, TRIG, HDL, LABVLDL, LDLCALC, CHOLHDL in the last 168  hours. Hematology Recent Labs  Lab 11/05/21 0912  WBC 8.5  RBC 3.02*  HGB 9.1*  HCT 28.5*  MCV 94.4  MCH 30.1  MCHC 31.9  RDW 13.2  PLT 296   Thyroid No results for input(s): TSH, FREET4 in the last 168 hours. BNP Recent Labs  Lab 11/05/21 0912  BNP 547.0*    DDimer No results for input(s): DDIMER in the last 168 hours.   Radiology/Studies:  DG Chest Portable 1 View  Result Date: 11/05/2021 CLINICAL DATA:  Chest pain with weakness EXAM: PORTABLE CHEST 1 VIEW COMPARISON:  Chest radiograph dated August 12, 2021 FINDINGS: The heart size and mediastinal contours are within upper normal limits. Sternotomy wires are intact. Both lungs are clear. The visualized skeletal structures are unremarkable. IMPRESSION: Stable appearance of the cardiomediastinal silhouette. No focal consolidation or pleural effusion. Electronically Signed    By: Keane Police D.O.   On: 11/05/2021 09:35     Assessment and Plan:   1. NSTEMI - She presents with worsening chest pain over the past week but developed acutely worsening symptoms last night which would improve with the use of SL NTG but represent. Still having active chest pain at this time and she is having frequent PVC's on telemetry and an episode of NSVT up to 8 beats - Initial Hs Troponin 158 with repeat values pending. COVID pending. EKG shows NSR, HR 90 with PVC's and IVCD.  - At the time of this encounter, she is having significant headaches with IV nitroglycerin. Will order Tylenol and another dose of Fentanyl. If unable to titrate nitroglycerin for control of her chest pain, we will be limited in regards to medical therapy. Heparin has been ordered. - Will review with Dr. Harl Bowie but if she continues to have significant chest pain throughout the day and given her ectopy on telemetry, may need to consider a cardiac catheterization this afternoon but this would not be ideal given that she took Eliquis last night at 2000. Hopefully can avoid cath until tomorrow. The patient understands that risks include but are not limited to stroke (1 in 1000), death (1 in 34), kidney failure [usually temporary] (1 in 500), bleeding (1 in 200), allergic reaction [possibly serious] (1 in 200).   - Continue ASA, Heparin, BB and statin therapy.  2. CAD - She is s/p CABG in 2005 with multiple interventions since with catheterization in 05/2015 showing occluded SVG-LAD and SVG-LCx with patent LIMA-D1 and patent SVG-RCA with no anatomy amenable to PCI and she did not have any significant ischemia by NST in 06/2018. - Will plan for repeat cardiac catheterization as outlined above - Continue ASA 81 mg daily, Toprol-XL 25 mg daily, Crestor 5 mg daily and Zetia 10 mg daily. Will hold PTA Imdur given current NTG patch.   3. HFimpEF - Her EF was previously 35-40% by cath in 2016, at 55-60% in 06/2018, 40-45% in  07/2021 and 45-50% in 08/2021.  She does not appear volume overloaded by examination today and but BNP was at 547 on admission.  We will continue PTA Toprol-XL and Entresto.  Was only taking Lasix PRN prior to admission and will hold given her upcoming catheterization.   4. Persistent Atrial Fibrillation - She is s/p DCCV in 07/2021. Maintaining NSR this admission. Will continue Toprol-XL. Hold Eliquis given upcoming cardiac catheterization. She is being started on IV Heparin given her NSTEMI.   5. PVC's  - She has a history of frequent PVC's. Will continue PTA Toprol-XL 25mg   daily and Mexiletine 150mg  TID.   6. HLD - Will recheck FLP. She has been intolerant to high-intensity statin therapy. Remains on low-dose Crestor persistent atrial fibrillation.      Risk Assessment/Risk Scores:    TIMI Risk Score for Unstable Angina or Non-ST Elevation MI:   The patient's TIMI risk score is 6, which indicates a 41% risk of all cause mortality, new or recurrent myocardial infarction or need for urgent revascularization in the next 14 days.{   Severity of Illness: The appropriate patient status for this patient is INPATIENT. Inpatient status is judged to be reasonable and necessary in order to provide the required intensity of service to ensure the patient's safety. The patient's presenting symptoms, physical exam findings, and initial radiographic and laboratory data in the context of their chronic comorbidities is felt to place them at high risk for further clinical deterioration. Furthermore, it is not anticipated that the patient will be medically stable for discharge from the hospital within 2 midnights of admission.   * I certify that at the point of admission it is my clinical judgment that the patient will require inpatient hospital care spanning beyond 2 midnights from the point of admission due to high intensity of service, high risk for further deterioration and high frequency of surveillance  required.*   For questions or updates, please contact [facility] Please consult [email or web address] for contact info under     Signed, Erma Heritage, PA-C  11/05/2021 12:54 PM    Attending Note   Patient seen and discsussed with PA Ahmed Prima, I agree with her documentation. 66 yo female history of CAD with prior CABG in 2005 4 vessel. Last cath as reported below in 2016, recs for medical management at the time due to anatomy. History of carotid setnosis, HTN, HL, afib on eliquis. Presents with chest pain. Escalating chest tightness  with associated SOB, diaphoresis. Lasting a few minutes at a time, up to 30 minutes.    K 3.7 Cr 0.70 Mg 2.1 Hgb 9.1 Plt 296 BNP 547 Trop 158-->210--> COVID neg flu neg CXR stable appearance EKG SR,  PVCs  05/2015 severe native disease with occluded prox RCA, occluded LAD, occluded LCX.  Ocluded SVG-LAD and LCX. Patent LIMA to diag, patent SVG-RCA. The stents within the mid to distal body of the right coronary saphenous vein graft or patent (multiple layers of stent within this region are noted).  06/2018 nuclear stress: inferior/inferolateral scar. No ischemia 08/2021 echo: LVEF 45-50%, grade II dd.    Patient with extensive CAD history as reported above presents with NSTEMI. Trop up to 210, no specific ischemic changes on EKG but frequent PVCs on tele. Ongoing chest pain, did not tolerate low dose nitro gtt due to headache, getting prn fentanyl(morphine allergy). D/c NG drip for now, continue nitro paste.  With ongoign chest pains would plan for cath today. Medical therapy with ASA, hep gtt, toprol. She is on entresto at home. History of afib, hold eliquis for planned cath. Prior admission with frequent PVCs, was started on mexilitene at that time, continue for now   Some higher bleeding risk given eliquis dose last night. DIscussed with cath labs. Patient with ongoing chest pains ranging fro 3-7/10, has received 36mcg fentalnyl x 2 and 46mcg x1. Did not  tolerate even low dose NG drip due to severe headache. Plan would be for cath today if cath lab staff agrees     Carlyle Dolly MD

## 2021-11-05 NOTE — Progress Notes (Incomplete)
Attending Note   Patient seen and discsussed with PA Strader, I agree with her documentation. 66 yo female history of CAD with prior CABG in 2005 4 vessel. Last cath as reported below in 2016, recs for medical management at the time due to anatomy. History of carotid setnosis, HTN, HL, afib on eliquis. Presents with chest pain. Escalating chest tightness  with associated SOB, diaphoresis. Lasting a few minutes at a time, up to 30 minutes.    K 3.7 Cr 0.70 Mg 2.1 Hgb 9.1 Plt 296 BNP 547 Trop 158-->210--> COVID neg flu neg CXR stable appearance EKG SR,  PVCs  05/2015 severe native disease with occluded prox RCA, occluded LAD, occluded LCX.  Ocluded SVG-LAD and LCX. Patent LIMA to diag, patent SVG-RCA. The stents within the mid to distal body of the right coronary saphenous vein graft or patent (multiple layers of stent within this region are noted).  06/2018 nuclear stress: inferior/inferolateral scar. No ischemia 08/2021 echo: LVEF 45-50%, grade II dd.    Patient with extensive CAD history as reported above presents with NSTEMI. Trop up to 210, no specific ischemic changes on EKG but frequent PVCs on tele. Ongoing chest pain, did not tolerate low dose nitro gtt due to headache, getting prn fentanyl(morphine allergy). D/c NG drip for now, continue nitro paste.  With ongoign chest pains would plan for cath today. Medical therapy with ASA, hep gtt, toprol. She is on entresto at home. History of afib, hold eliquis for planned cath. Prior admission with frequent PVCs, was started on mexilitene at that time, continue for now   Some higher bleeding risk given eliquis dose last night. DIscussed with cath labs. Patient with ongoing chest pains ranging fro 3-7/10, has received 35mcg fentalnyl x 2 and 2mcg x1. Did not tolerate even low dose NG drip due to severe headache. Plan would be for cath today if cath lab staff agrees     Carlyle Dolly MD

## 2021-11-05 NOTE — ED Triage Notes (Signed)
Pt presents to ED with complaints of chest tightness down left arm with SOB, started last night. Pt states she has took 1 nitro PTA with relief

## 2021-11-05 NOTE — ED Notes (Signed)
Per Wyline Mood, MD pt to have Nitro restarted if CP gets to 7/10, at this time keep nitro paste in place

## 2021-11-05 NOTE — ED Notes (Signed)
Spoke with pharmacist re: no order for Heparin bolus who recommended walking with Randall An, PA, per PA at this time the pt is not to receive a heparin bolus

## 2021-11-05 NOTE — Progress Notes (Signed)
ANTICOAGULATION CONSULT NOTE - Initial Consult  Pharmacy Consult for heparin Indication: chest pain/ACS  Allergies  Allergen Reactions   Pneumococcal Vaccines Anxiety, Palpitations and Cough   Adhesive [Tape] Itching and Other (See Comments)    Blisters (pls use paper tape)   Crestor [Rosuvastatin] Other (See Comments)    Muscle pain   Lipitor [Atorvastatin] Other (See Comments)    Cramps   Morphine And Related Itching    Pt states she takes with benadryl    Patient Measurements: Height: 5\' 3"  (160 cm) Weight: 87.1 kg (192 lb) IBW/kg (Calculated) : 52.4 Heparin Dosing Weight: 72 kg  Vital Signs: Temp: 97.9 F (36.6 C) (02/01 0906) Temp Source: Oral (02/01 0906) BP: 150/75 (02/01 1030) Pulse Rate: 77 (02/01 1030)  Labs: Recent Labs    11/05/21 0912  HGB 9.1*  HCT 28.5*  PLT 296  LABPROT 11.8  INR 0.9  CREATININE 0.70  TROPONINIHS 158*    Estimated Creatinine Clearance: 73.4 mL/min (by C-G formula based on SCr of 0.7 mg/dL).   Medical History: Past Medical History:  Diagnosis Date   Acute renal failure (HCC)    Anemia    Anticoagulation adequate 08/15/2021   Anxiety    Arthritis    Asthma    Carotid artery disease (HCC)    a. Duplex 50-69% BICA 03/2014.   Chronic back pain    Collagen vascular disease (HCC)    COPD (chronic obstructive pulmonary disease) (HCC)    Coronary artery disease    a. s/p prior CABG 2005. b. BMS 02/2009 to VG-PDA. c. NSTEMI 08/2010 s/p PTCA/DES to SVG-PDA in previously stented area. d. DES to SVG to RCA on 05/24/14  c. LHC 06/04/2015 w/ no sig change in her anatomy and Rx managment   DDD (degenerative disc disease)    Depression    Diverticulitis    4 documented episodes by CT; most recent April 2012   DIVERTICULITIS OF COLON 07/14/2010   Qualifier: Diagnosis of  By: 09/13/2010.    GERD (gastroesophageal reflux disease)    History of blood transfusion    HTN (hypertension)    Hyperlipidemia    Hypertension     Hypothyroidism    Ischemic cardiomyopathy    a. EF 40% in 2012 by cath. b. improved to 50-55% by cath 05/2014  c. EF 35-40% by [facility] 05/2015   Kidney stones    Persistent atrial fibrillation (HCC) with DCCV 07/30/21 maintaining SR 08/15/2021   Seizures (HCC)    Shock (HCC) hypovolemic 08/13/2021   Sinus bradycardia    Sleep apnea    a. Mild-to-moderate obstructive sleep apnea diagnosed in April 2012 by sleep study.   Tension headache     Medications:  (Not in a hospital admission)   Assessment: Pharmacy consulted to dose heparin in patient with chest pain/ACS.  Patient is on apixaban prior to admission with last dose 1/31 PM ~8 PM per patient.  Will need to monitor based on aPTT until correlation with heparin levels.  Hgb 9.1 Trop 158  Goal of Therapy:  Heparin level 0.3-0.7 units/ml aPTT 66-102 seconds Monitor platelets by anticoagulation protocol: Yes   Plan:  Start heparin infusion at 900 units/hr Check anti-Xa level in 6 hours and daily while on heparin Continue to monitor H&H and platelets  2/31, PharmD Clinical Pharmacist 11/05/2021 11:26 AM

## 2021-11-05 NOTE — ED Notes (Signed)
Date and time results received: 11/05/21 11:41 AM  (use smartphrase ".now" to insert current time)  Test: troponin Critical Value: 210  Name of Provider Notified:   Orders Received? Or Actions Taken?: see orders

## 2021-11-05 NOTE — ED Provider Notes (Signed)
[facility] EMERGENCY DEPARTMENT Provider Note   CSN: [id number] Arrival date & time: 11/05/21  9242     History  Chief Complaint  Patient presents with   Chest Pain    [name] is a 66 y.o. female.  HPI Patient presents for chest pain.  Medical history notable for HLD, HTN, CAD s/p CABG in 2005, obesity, COPD, GERD, ischemic cardiomyopathy, CHF, atrial fibrillation.  She is followed by [facility], Dr. Dina Rich.  Last clinic visit was in October.  Patient reports that she has had ongoing cough and shortness of breath for the past 2 weeks.  The symptoms have worsened.  Her cough has been productive of yellow sputum.  Additionally, over the past 4 days, patient has been experiencing worsening chest pain.  Pain is described as central in location.  It does radiate into her neck and left shoulder.  Recently, it has also been radiating into her right shoulder.  Pain and shortness of breath are worsened with exertion.  At home, she has been taking NTG tablets that do relieve her symptoms.  She also continues to take aspirin, Plavix, and Eliquis.  Last doses of DAPT and Eliquis were last night.  Today, she has taken 1 SL NTG.  Patient presents to the ED due to persistent and worsening symptoms.    Home Medications Prior to Admission medications   Medication Sig Start Date End Date Taking? Authorizing Provider  albuterol (VENTOLIN HFA) 108 (90 Base) MCG/ACT inhaler Inhale 2 puffs into the lungs every 6 (six) hours as needed for wheezing or shortness of breath.   Yes [provider]  amLODipine (NORVASC) 10 MG tablet Take 10 mg by mouth daily. 09/17/21  Yes [provider]  apixaban (ELIQUIS) 5 MG TABS tablet Take 1 tablet (5 mg total) by mouth 2 (two) times daily. 06/16/21  Yes Netta Neat., NP  aspirin 81 MG chewable tablet Chew 1 tablet (81 mg total) by mouth daily. 08/16/21  Yes Leone Brand, NP  ezetimibe (ZETIA) 10 MG tablet TAKE (1) TABLET BY MOUTH ONCE  DAILY. Patient taking differently: Take 10 mg by mouth daily. 02/28/21  Yes BranchDorothe Pea, MD  isosorbide mononitrate (IMDUR) 60 MG 24 hr tablet Take 60 mg by mouth daily. 09/25/21  Yes [provider]  levETIRAcetam (KEPPRA) 250 MG tablet TAKE (1) TABLET BY MOUTH TWICE DAILY. 09/25/21  Yes Anabel Halon, MD  levothyroxine (SYNTHROID) 75 MCG tablet Take 1 tablet (75 mcg total) by mouth daily before breakfast. 06/16/21  Yes Netta Neat., NP  metoprolol succinate (TOPROL-XL) 50 MG 24 hr tablet TAKE (1) TABLET BY MOUTH ONCE DAILY. TAKE WITH OR IMMEDIATELY FOLLOWING A MEAL. 09/17/21  Yes BranchDorothe Pea, MD  mexiletine (MEXITIL) 150 MG capsule Take 1 capsule (150 mg total) by mouth every 8 (eight) hours. 08/15/21  Yes Leone Brand, NP  Multiple Vitamin (MULTIVITAMIN WITH MINERALS) TABS tablet Take 1 tablet by mouth 2 (two) times daily. Patient taking differently: Take 1 tablet by mouth daily. 08/28/13  Yes Branch, Dorothe Pea, MD  nitroGLYCERIN (NITROSTAT) 0.4 MG SL tablet DISSOLVE 1 TABLET UNDER TONGUE EVERY 5 MINUTES UP TO 15 MIN FOR CHESTPAIN. IF NO RELIEF CALL 911. 06/16/21  Yes Netta Neat., NP  Omega 3 1000 MG CAPS Take 1 capsule (1,000 mg total) by mouth 2 (two) times daily. 08/27/20  Yes Netta Neat., NP  omeprazole (PRILOSEC) 20 MG capsule TAKE (1) CAPSULE BY  MOUTH ONCE DAILY. 11/03/21  Yes BranchDorothe Pea, Jonathan F, MD  prasugrel (EFFIENT) 10 MG TABS tablet Take 10 mg by mouth every morning. 10/13/21  Yes [provider]  sacubitril-valsartan (ENTRESTO) 24-26 MG Take 1 tablet by mouth 2 (two) times daily. 09/19/21  Yes BranchDorothe Pea, Jonathan F, MD  acetaminophen (TYLENOL) 650 MG CR tablet Take 1,300 mg by mouth every 8 (eight) hours as needed for pain.    [provider]  cetirizine (ZYRTEC ALLERGY) 10 MG tablet Take 1 tablet (10 mg total) by mouth daily. Patient not taking: Reported on 11/05/2021 06/05/20   Durward ParcelAvegno, Komlanvi S, FNP  furosemide (LASIX) 80 MG  tablet Take 1 tablet (80 mg total) by mouth daily as needed for edema (we changed to if needed because you were dehydrated). Patient not taking: Reported on 11/05/2021 08/15/21   Leone BrandIngold, Laura R, NP  hydrOXYzine (ATARAX/VISTARIL) 25 MG tablet Take 1 tablet (25 mg total) by mouth every 6 (six) hours as needed for anxiety. Patient not taking: Reported on 11/05/2021 08/18/21   Antoine PocheBranch, Jonathan F, MD  metoprolol succinate (TOPROL-XL) 25 MG 24 hr tablet Take 1 tablet (25 mg total) by mouth daily. Patient not taking: Reported on 11/05/2021 08/16/21   Leone BrandIngold, Laura R, NP  potassium chloride SA (KLOR-CON) 20 MEQ tablet Take 1 tablet (20 mEq total) by mouth daily as needed. Patient not taking: Reported on 11/05/2021 08/15/21   Leone BrandIngold, Laura R, NP  rosuvastatin (CRESTOR) 5 MG tablet Take 1 tablet (5 mg total) by mouth daily. Patient not taking: Reported on 11/05/2021 07/25/20   Netta NeatQuinn, Andrew L Jr., NP      Allergies    Pneumococcal vaccines, Adhesive [tape], Crestor [rosuvastatin], Lipitor [atorvastatin], and Morphine and related    Review of Systems   Review of Systems  Constitutional:  Positive for fatigue.  Respiratory:  Positive for cough, chest tightness and shortness of breath.   Cardiovascular:  Positive for chest pain.  Neurological:  Positive for weakness (Generalized).  All other systems reviewed and are negative.  Physical Exam Updated Vital Signs BP (!) 114/54 (BP Location: Right Arm)    Pulse 74    Temp 99.2 F (37.3 C) (Oral)    Resp 18    Ht 5\' 3"  (1.6 m)    Wt 88.2 kg    SpO2 91%    BMI 34.45 kg/m  Physical Exam Vitals and nursing note reviewed.  Constitutional:      General: She is not in acute distress.    Appearance: Normal appearance. She is well-developed. She is ill-appearing (Chronically). She is not toxic-appearing or diaphoretic.  HENT:     Head: Normocephalic and atraumatic.     Right Ear: External ear normal.     Left Ear: External ear normal.     Nose: Nose normal.      Mouth/Throat:     Mouth: Mucous membranes are moist.     Pharynx: Oropharynx is clear.  Eyes:     Extraocular Movements: Extraocular movements intact.     Conjunctiva/sclera: Conjunctivae normal.  Neck:     Vascular: JVD present.  Cardiovascular:     Rate and Rhythm: Normal rate and regular rhythm.     Heart sounds: No murmur heard. Pulmonary:     Effort: Pulmonary effort is normal. No respiratory distress.     Breath sounds: Normal breath sounds. No wheezing or rales.  Chest:     Chest wall: No tenderness.  Abdominal:     Palpations: Abdomen is soft.  Tenderness: There is no abdominal tenderness.  Musculoskeletal:        General: No swelling. Normal range of motion.     Cervical back: Neck supple.     Right lower leg: No edema.     Left lower leg: No edema.  Skin:    General: Skin is warm and dry.     Capillary Refill: Capillary refill takes less than 2 seconds.     Coloration: Skin is not jaundiced.  Neurological:     General: No focal deficit present.     Mental Status: She is alert and oriented to person, place, and time.     Cranial Nerves: No cranial nerve deficit.     Sensory: No sensory deficit.     Motor: No weakness.     Coordination: Coordination normal.  Psychiatric:        Mood and Affect: Mood normal.        Behavior: Behavior normal.        Thought Content: Thought content normal.        Judgment: Judgment normal.    ED Results / Procedures / Treatments   Labs (all labs ordered are listed, but only abnormal results are displayed) Labs Reviewed  COMPREHENSIVE METABOLIC PANEL - Abnormal; Notable for the following components:      Result Value   Glucose, Bld 106 (*)    Calcium 8.5 (*)    Total Bilirubin 0.1 (*)    All other components within normal limits  CBC WITH DIFFERENTIAL/PLATELET - Abnormal; Notable for the following components:   RBC 3.02 (*)    Hemoglobin 9.1 (*)    HCT 28.5 (*)    All other components within normal limits  BRAIN  NATRIURETIC PEPTIDE - Abnormal; Notable for the following components:   B Natriuretic Peptide 547.0 (*)    All other components within normal limits  CBC - Abnormal; Notable for the following components:   RBC 2.81 (*)    Hemoglobin 8.5 (*)    HCT 25.7 (*)    All other components within normal limits  BASIC METABOLIC PANEL - Abnormal; Notable for the following components:   Potassium 3.4 (*)    Glucose, Bld 119 (*)    Calcium 8.4 (*)    All other components within normal limits  LIPID PANEL - Abnormal; Notable for the following components:   Cholesterol 229 (*)    Triglycerides 152 (*)    LDL Cholesterol 135 (*)    All other components within normal limits  GLUCOSE, CAPILLARY - Abnormal; Notable for the following components:   Glucose-Capillary 102 (*)    All other components within normal limits  TROPONIN I (HIGH SENSITIVITY) - Abnormal; Notable for the following components:   Troponin I (High Sensitivity) 158 (*)    All other components within normal limits  TROPONIN I (HIGH SENSITIVITY) - Abnormal; Notable for the following components:   Troponin I (High Sensitivity) 210 (*)    All other components within normal limits  RESP PANEL BY RT-PCR (FLU A&B, COVID) ARPGX2  MAGNESIUM  PROTIME-INR  APTT  HEPARIN LEVEL (UNFRACTIONATED)  POCT ACTIVATED CLOTTING TIME  POCT ACTIVATED CLOTTING TIME  POCT ACTIVATED CLOTTING TIME    EKG EKG Interpretation  Date/Time:  Wednesday November 05 2021 09:04:54 EST Ventricular Rate:  90 PR Interval:  132 QRS Duration: 110 QT Interval:  375 QTC Calculation: 452 R Axis:   54 Text Interpretation: Sinus rhythm Multiform ventricular premature complexes Low voltage, extremity leads Nonspecific T  abnormalities, lateral leads Confirmed by Alona Bene [phone]) on 11/06/2021 3:45:59 PM  Radiology CARDIAC CATHETERIZATION  Result Date: 11/05/2021   Mid LAD lesion is 100% stenosed.  SVG to LAD is occluded.  The distal LAD appears to fill by LIMA and  retrograde flow from the diagonal.   Prox RCA lesion is 100% stenosed.  SVG to distal RCA is patent.  Mild in-stent restenosis in the RCA stents.   1st Diag lesion is 99% stenosed.  There appear to be left to left collaterals from the distal LAD.   2nd Mrg lesion is 100% stenosed.  There are left to left collaterals filling this vessel.   Mid LM to Ost LAD lesion is 80% stenosed which extended into the proximal circumflex.  Significant calcium by IVUS.   A drug-eluting stent was successfully placed from the proximal circumflex to the proximal left main using a SYNERGY XD [id number], postdilated to greater than 4.5 mm in the left main.   Post intervention, there is a 0% residual stenosis.   LV end diastolic pressure is normal.   There is no aortic valve stenosis. Complex lesion in the left main extending into the circumflex.  Successful stent placement after [place] Balloon angioplasty.  Significant tortuosity in the mid circumflex made wiring the vessel difficult. She will need aggressive secondary prevention.  Plavix was added.  She is mildly anemic.  If no significant bleeding issues, she can restart Eliquis tomorrow.  She had been off of Eliquis less than 24 hours prior to this procedure.  Given her anemia, will likely hold aspirin. Aggressive hydration post cath.   DG Chest Portable 1 View  Result Date: 11/05/2021 CLINICAL DATA:  Chest pain with weakness EXAM: PORTABLE CHEST 1 VIEW COMPARISON:  Chest radiograph dated August 12, 2021 FINDINGS: The heart size and mediastinal contours are within upper normal limits. Sternotomy wires are intact. Both lungs are clear. The visualized skeletal structures are unremarkable. IMPRESSION: Stable appearance of the cardiomediastinal silhouette. No focal consolidation or pleural effusion. Electronically Signed   By: Larose Hires D.O.   On: 11/05/2021 09:35    Procedures Procedures    Medications Ordered in ED Medications  sodium chloride flush (NS) 0.9 % injection 3  mL (3 mLs Intravenous Given 11/06/21 1004)  sodium chloride flush (NS) 0.9 % injection 3 mL (has no administration in time range)  0.9 %  sodium chloride infusion (has no administration in time range)  0.9 %  sodium chloride infusion (0 mLs Intravenous Stopped 11/05/21 1800)  ezetimibe (ZETIA) tablet 10 mg (10 mg Oral Given 11/06/21 1003)  metoprolol succinate (TOPROL-XL) 24 hr tablet 25 mg (25 mg Oral Given 11/06/21 1003)  mexiletine (MEXITIL) capsule 150 mg (150 mg Oral Given 11/06/21 1454)  rosuvastatin (CRESTOR) tablet 5 mg (5 mg Oral Given 11/05/21 1819)  levothyroxine (SYNTHROID) tablet 75 mcg (75 mcg Oral Given 11/06/21 0610)  pantoprazole (PROTONIX) EC tablet 40 mg (40 mg Oral Given 11/06/21 0958)  albuterol (PROVENTIL) (2.5 MG/3ML) 0.083% nebulizer solution 3 mL (has no administration in time range)  levETIRAcetam (KEPPRA) tablet 250 mg (250 mg Oral Given 11/06/21 1003)  nitroGLYCERIN (NITROSTAT) SL tablet 0.4 mg (0.4 mg Sublingual Given 11/06/21 0633)  acetaminophen (TYLENOL) tablet 650 mg (650 mg Oral Given 11/06/21 0957)  ondansetron (ZOFRAN) injection 4 mg (4 mg Intravenous Given 11/06/21 0103)  sacubitril-valsartan (ENTRESTO) 24-26 mg per tablet (1 tablet Oral Given 11/06/21 1002)  labetalol (NORMODYNE) injection 10 mg (has no administration in time range)  hydrALAZINE (APRESOLINE)  injection 10 mg (has no administration in time range)  0.9 %  sodium chloride infusion (0 mLs Intravenous Stopped 11/05/21 2245)  sodium chloride flush (NS) 0.9 % injection 3 mL (3 mLs Intravenous Given 11/06/21 1004)  sodium chloride flush (NS) 0.9 % injection 3 mL (has no administration in time range)  0.9 %  sodium chloride infusion (has no administration in time range)  clopidogrel (PLAVIX) tablet 75 mg (75 mg Oral Given 11/06/21 0957)  guaiFENesin-dextromethorphan (ROBITUSSIN DM) 100-10 MG/5ML syrup 5 mL (5 mLs Oral Given 11/05/21 1819)  diphenhydrAMINE (BENADRYL) capsule 25 mg (has no administration in time range)  fluticasone  (FLONASE) 50 MCG/ACT nasal spray 1 spray (1 spray Each Nare Given 11/06/21 1038)  apixaban (ELIQUIS) tablet 5 mg (5 mg Oral Given 11/06/21 1003)  amLODipine (NORVASC) tablet 5 mg (5 mg Oral Given 11/06/21 1034)  aspirin chewable tablet 324 mg (324 mg Oral Given 11/05/21 0939)  nitroGLYCERIN (NITROGLYN) 2 % ointment 1 inch (1 inch Topical Given 11/05/21 0940)  fentaNYL (SUBLIMAZE) injection 50 mcg (50 mcg Intravenous Given 11/05/21 1018)  fentaNYL (SUBLIMAZE) injection 50 mcg (50 mcg Intravenous Given 11/05/21 1145)  fentaNYL (SUBLIMAZE) injection 25 mcg (25 mcg Intravenous Given 11/05/21 1420)  ALPRAZolam (XANAX) tablet 0.5 mg (0.5 mg Oral Given 11/05/21 2025)  oxyCODONE-acetaminophen (PERCOCET/ROXICET) 5-325 MG per tablet 1 tablet (1 tablet Oral Given 11/05/21 2025)  potassium chloride SA (KLOR-CON M) CR tablet 40 mEq (40 mEq Oral Given 11/06/21 0958)  fentaNYL (SUBLIMAZE) injection 25 mcg (25 mcg Intravenous Given 11/06/21 1004)    ED Course/ Medical Decision Making/ A&P                           Medical Decision Making Amount and/or Complexity of Data Reviewed Labs: ordered. Radiology: ordered.  Risk OTC drugs. Prescription drug management. Decision regarding hospitalization.   This patient presents to the ED for concern of chest pain, this involves an extensive number of treatment options, and is a complaint that carries with it a high risk of complications and morbidity.  The differential diagnosis includes ACS, CHF, COPD, PE, PUD.   Co morbidities that complicate the patient evaluation  HLD, HTN, CAD s/p CABG in 2005, obesity, COPD, GERD, ischemic cardiomyopathy, CHF, atrial fibrillation   Additional history obtained:  Additional history obtained from N/A External records from outside source obtained and reviewed including EMR   Lab Tests:  I Ordered, and personally interpreted labs.  The pertinent results include: Elevated troponin, elevated BNP, baseline normocytic anemia   Imaging  Studies ordered:  I ordered imaging studies including chest x-ray I independently visualized and interpreted imaging which showed no acute findings I agree with the radiologist interpretation   Cardiac Monitoring:  The patient was maintained on a cardiac monitor.  I personally viewed and interpreted the cardiac monitored which showed an underlying rhythm of: Sinus rhythm   Medicines ordered and prescription drug management:  I ordered medication including ASA, NTG, fentanyl, heparin for NSTEMI Reevaluation of the patient after these medicines showed that the patient improved I have reviewed the patients home medicines and have made adjustments as needed   Critical Interventions:  ASA and heparin for NSTEMI, consultation with cardiology   Consultations Obtained:  I requested consultation with the cardiologist,  and discussed lab and imaging findings as well as pertinent plan - they recommend: Heparinization and admission   Problem List / ED Course:  66 year old female with history of CAD, presenting for  4 days of worsening chest pain.  Pain is worsened with exertion and relieved by nitroglycerin/rest.  Pain is substernal and does radiate to left neck and left shoulder.  Vital signs on arrival are notable for moderate hypertension.  Patient's last doses of ASA, Plavix, and Eliquis were yesterday.  324 of ASA was given.  Nitroglycerin ointment was applied.  EKG does not show ST segment changes.  Patient to undergo laboratory work-up.  On reassessment, patient reported improved but still present chest pain.  Her blood pressure remained elevated.  She was started on NTG gtt. and fentanyl was given.  Patient's initial troponin was elevated, consistent with NSTEMI.  Cardiology was consulted.  They agreed with heparinization, given that patient's last dose of Eliquis was last night.  They stated that they will come to evaluate the patient in the ED and [facility]  Patient was subsequently admitted to their service.   Reevaluation:  After the interventions noted above, I reevaluated the patient and found that they have :improved   Social Determinants of Health:  Patient has access to outpatient care.   Dispostion:  After consideration of the diagnostic results and the patients response to treatment, I feel that the patent would benefit from admission.   CRITICAL CARE Performed by: Gloris Manchesteryan Latonya Knight   Total critical care time: 33 minutes  Critical care time was exclusive of separately billable procedures and treating other patients.  Critical care was necessary to treat or prevent imminent or life-threatening deterioration.  Critical care was time spent personally by me on the following activities: development of treatment plan with patient and/or surrogate as well as nursing, discussions with consultants, evaluation of patient's response to treatment, examination of patient, obtaining history from patient or surrogate, ordering and performing treatments and interventions, ordering and review of laboratory studies, ordering and review of radiographic studies, pulse oximetry and re-evaluation of patient's condition.         Final Clinical Impression(s) / ED Diagnoses Final diagnoses:  NSTEMI (non-ST elevated myocardial infarction) [facility](HCC)    Rx / DC Orders ED Discharge Orders          Ordered    AMB Referral to [facility]        11/06/21 0843    Amb Referral to Cardiac Rehabilitation        11/06/21 1353    AMB Referral to Cardiac Rehabilitation - Phase II  Status:  Canceled        11/05/21 1726    pantoprazole (PROTONIX) 40 MG tablet  Daily        Pending              Gloris Manchesterixon, Adeana Grilliot, MD 11/06/21 1622

## 2021-11-06 ENCOUNTER — Encounter (HOSPITAL_COMMUNITY): Payer: Self-pay | Admitting: Interventional Cardiology

## 2021-11-06 ENCOUNTER — Other Ambulatory Visit (HOSPITAL_COMMUNITY): Payer: Self-pay

## 2021-11-06 DIAGNOSIS — I2510 Atherosclerotic heart disease of native coronary artery without angina pectoris: Secondary | ICD-10-CM | POA: Diagnosis not present

## 2021-11-06 DIAGNOSIS — I214 Non-ST elevation (NSTEMI) myocardial infarction: Secondary | ICD-10-CM | POA: Diagnosis not present

## 2021-11-06 DIAGNOSIS — T82855A Stenosis of coronary artery stent, initial encounter: Secondary | ICD-10-CM | POA: Diagnosis not present

## 2021-11-06 DIAGNOSIS — I472 Ventricular tachycardia, unspecified: Secondary | ICD-10-CM | POA: Diagnosis not present

## 2021-11-06 DIAGNOSIS — Z9861 Coronary angioplasty status: Secondary | ICD-10-CM

## 2021-11-06 DIAGNOSIS — Z20822 Contact with and (suspected) exposure to covid-19: Secondary | ICD-10-CM | POA: Diagnosis not present

## 2021-11-06 LAB — GLUCOSE, CAPILLARY: Glucose-Capillary: 102 mg/dL — ABNORMAL HIGH (ref 70–99)

## 2021-11-06 LAB — CBC
HCT: 25.7 % — ABNORMAL LOW (ref 36.0–46.0)
Hemoglobin: 8.5 g/dL — ABNORMAL LOW (ref 12.0–15.0)
MCH: 30.2 pg (ref 26.0–34.0)
MCHC: 33.1 g/dL (ref 30.0–36.0)
MCV: 91.5 fL (ref 80.0–100.0)
Platelets: 259 10*3/uL (ref 150–400)
RBC: 2.81 MIL/uL — ABNORMAL LOW (ref 3.87–5.11)
RDW: 13.1 % (ref 11.5–15.5)
WBC: 8.6 10*3/uL (ref 4.0–10.5)
nRBC: 0 % (ref 0.0–0.2)

## 2021-11-06 LAB — BASIC METABOLIC PANEL
Anion gap: 9 (ref 5–15)
BUN: 10 mg/dL (ref 8–23)
CO2: 22 mmol/L (ref 22–32)
Calcium: 8.4 mg/dL — ABNORMAL LOW (ref 8.9–10.3)
Chloride: 104 mmol/L (ref 98–111)
Creatinine, Ser: 0.71 mg/dL (ref 0.44–1.00)
GFR, Estimated: >60 mL/min (ref >60)
Glucose, Bld: 119 mg/dL — ABNORMAL HIGH (ref 70–99)
Potassium: 3.4 mmol/L — ABNORMAL LOW (ref 3.5–5.1)
Sodium: 135 mmol/L (ref 135–145)

## 2021-11-06 LAB — LIPID PANEL
Cholesterol: 229 mg/dL — ABNORMAL HIGH (ref 0–200)
HDL: 64 mg/dL (ref >40)
LDL Cholesterol: 135 mg/dL — ABNORMAL HIGH (ref 0–99)
Total CHOL/HDL Ratio: 3.6 RATIO
Triglycerides: 152 mg/dL — ABNORMAL HIGH (ref <150)
VLDL: 30 mg/dL (ref 0–40)

## 2021-11-06 MED ORDER — FENTANYL CITRATE PF 50 MCG/ML IJ SOSY
25.0000 ug | PREFILLED_SYRINGE | Freq: Once | INTRAMUSCULAR | Status: AC
  Administered 2021-11-06: 25 ug via INTRAVENOUS
  Filled 2021-11-06: qty 1

## 2021-11-06 MED ORDER — POTASSIUM CHLORIDE CRYS ER 20 MEQ PO TBCR
40.0000 meq | EXTENDED_RELEASE_TABLET | Freq: Once | ORAL | Status: AC
  Administered 2021-11-06: 40 meq via ORAL
  Filled 2021-11-06: qty 2

## 2021-11-06 MED ORDER — FLUTICASONE PROPIONATE 50 MCG/ACT NA SUSP
1.0000 | Freq: Every day | NASAL | Status: DC | PRN
  Administered 2021-11-06 – 2021-11-07 (×3): 1 via NASAL
  Filled 2021-11-06 (×2): qty 16

## 2021-11-06 MED ORDER — ZOLPIDEM TARTRATE 5 MG PO TABS
5.0000 mg | ORAL_TABLET | Freq: Every evening | ORAL | Status: DC | PRN
  Administered 2021-11-06: 5 mg via ORAL
  Filled 2021-11-06: qty 1

## 2021-11-06 MED ORDER — ISOSORBIDE MONONITRATE ER 60 MG PO TB24
60.0000 mg | ORAL_TABLET | Freq: Every day | ORAL | Status: DC
  Filled 2021-11-06: qty 1

## 2021-11-06 MED ORDER — APIXABAN 5 MG PO TABS
5.0000 mg | ORAL_TABLET | Freq: Two times a day (BID) | ORAL | Status: DC
  Administered 2021-11-06 – 2021-11-07 (×3): 5 mg via ORAL
  Filled 2021-11-06 (×3): qty 1

## 2021-11-06 MED ORDER — AMLODIPINE BESYLATE 5 MG PO TABS
5.0000 mg | ORAL_TABLET | Freq: Every day | ORAL | Status: DC
  Administered 2021-11-06 – 2021-11-07 (×2): 5 mg via ORAL
  Filled 2021-11-06 (×2): qty 1

## 2021-11-06 NOTE — Progress Notes (Signed)
° °  Notiried that patient had episode of 6/10 chest pain this morning with associated nausea. Completely resolved with 1 dose of sublingual Nitro. Patient underwent PCI to left main yesterday. EKG this morning shows no acute ischemic changes. Will continue to monitor for now and when our team rounds on her this morning, we can determine whether any adjustments needs to be made to her antianginals.  Corrin Parker, PA-C 11/06/2021 7:04 AM

## 2021-11-06 NOTE — Progress Notes (Signed)
Progress Note  Patient Name: [name] Date of Encounter: 11/06/2021  [facility] Cardiologist: Carlyle Dolly, MD   Subjective   Patient had one episode of chest pain this AM, 6/10, described as pressure and SOB. Resolved with 1 SL nitro. Denies any other episodes of chest pain, trouble breathing. Has a headache that developed after she was given nitro.   Inpatient Medications    Scheduled Meds:  aspirin  81 mg Oral Daily   clopidogrel  75 mg Oral Q breakfast   ezetimibe  10 mg Oral Daily   levETIRAcetam  250 mg Oral BID   levothyroxine  75 mcg Oral QAC breakfast   metoprolol succinate  25 mg Oral Daily   mexiletine  150 mg Oral Q8H   pantoprazole  40 mg Oral Daily   rosuvastatin  5 mg Oral q1800   sacubitril-valsartan  1 tablet Oral BID   sodium chloride flush  3 mL Intravenous Q12H   sodium chloride flush  3 mL Intravenous Q12H   Continuous Infusions:  sodium chloride     sodium chloride Stopped (11/05/21 1800)   sodium chloride     PRN Meds: sodium chloride, sodium chloride, acetaminophen, albuterol, diphenhydrAMINE, fluticasone, guaiFENesin-dextromethorphan, nitroGLYCERIN, ondansetron (ZOFRAN) IV, sodium chloride flush, sodium chloride flush   Vital Signs    Vitals:   11/06/21 0439 11/06/21 0440 11/06/21 0632 11/06/21 0639  BP: 117/62 117/62 105/76 115/67  Pulse:  72    Resp: 16 16 17 12   Temp:  98.5 F (36.9 C)    TempSrc:  Axillary    SpO2:  100%    Weight:  88.2 kg    Height:        Intake/Output Summary (Last 24 hours) at 11/06/2021 0828 Last data filed at 11/06/2021 0700 Gross per 24 hour  Intake 1188.52 ml  Output 1700 ml  Net -511.48 ml   Last 3 Weights 11/06/2021 11/05/2021 08/15/2021  Weight (lbs) 194 lb 8 oz 192 lb 192 lb 0.3 oz  Weight (kg) 88.225 kg 87.091 kg 87.1 kg      Telemetry    Sinus rhythm - Personally Reviewed  ECG    Sinus rhythm with PVC, no ischemic changes - Personally Reviewed  Physical Exam   GEN: No acute  distress. Appears fatigued Neck: No JVD Cardiac: RRR, no murmurs, rubs, or gallops.  Respiratory: Clear to auscultation bilaterally. Vascular: Radial pulses 2+ bilaterally. Left wrist cath site stable, soft, nontender.  MS: No edema; No deformity. Neuro:  Nonfocal  Psych: Normal affect   Labs    High Sensitivity Troponin:   Recent Labs  Lab 11/05/21 0912 11/05/21 1057  TROPONINIHS 158* 210*     Chemistry Recent Labs  Lab 11/05/21 0912 11/06/21 0127  NA 135 135  K 3.7 3.4*  CL 106 104  CO2 23 22  GLUCOSE 106* 119*  BUN 13 10  CREATININE 0.70 0.71  CALCIUM 8.5* 8.4*  MG 2.1  --   PROT 6.8  --   ALBUMIN 3.8  --   AST 23  --   ALT 17  --   ALKPHOS 65  --   BILITOT 0.1*  --   GFRNONAA >60 >60  ANIONGAP 6 9    Lipids  Recent Labs  Lab 11/06/21 0127  CHOL 229*  TRIG 152*  HDL 64  LDLCALC 135*  CHOLHDL 3.6    Hematology Recent Labs  Lab 11/05/21 0912 11/06/21 0127  WBC 8.5 8.6  RBC 3.02* 2.81*  HGB 9.1* 8.5*  HCT 28.5* 25.7*  MCV 94.4 91.5  MCH 30.1 30.2  MCHC 31.9 33.1  RDW 13.2 13.1  PLT 296 259   Thyroid No results for input(s): TSH, FREET4 in the last 168 hours.  BNP Recent Labs  Lab 11/05/21 0912  BNP 547.0*    DDimer No results for input(s): DDIMER in the last 168 hours.   Radiology    CARDIAC CATHETERIZATION  Result Date: 11/05/2021   Mid LAD lesion is 100% stenosed.  SVG to LAD is occluded.  The distal LAD appears to fill by LIMA and retrograde flow from the diagonal.   Prox RCA lesion is 100% stenosed.  SVG to distal RCA is patent.  Mild in-stent restenosis in the RCA stents.   1st Diag lesion is 99% stenosed.  There appear to be left to left collaterals from the distal LAD.   2nd Mrg lesion is 100% stenosed.  There are left to left collaterals filling this vessel.   Mid LM to Ost LAD lesion is 80% stenosed which extended into the proximal circumflex.  Significant calcium by IVUS.   A drug-eluting stent was successfully placed from the  proximal circumflex to the proximal left main using a SYNERGY XD 3.50X16, postdilated to greater than 4.5 mm in the left main.   Post intervention, there is a 0% residual stenosis.   LV end diastolic pressure is normal.   There is no aortic valve stenosis. Complex lesion in the left main extending into the circumflex.  Successful stent placement after Cutting Balloon angioplasty.  Significant tortuosity in the mid circumflex made wiring the vessel difficult. She will need aggressive secondary prevention.  Plavix was added.  She is mildly anemic.  If no significant bleeding issues, she can restart Eliquis tomorrow.  She had been off of Eliquis less than 24 hours prior to this procedure.  Given her anemia, will likely hold aspirin. Aggressive hydration post cath.   DG Chest Portable 1 View  Result Date: 11/05/2021 CLINICAL DATA:  Chest pain with weakness EXAM: PORTABLE CHEST 1 VIEW COMPARISON:  Chest radiograph dated August 12, 2021 FINDINGS: The heart size and mediastinal contours are within upper normal limits. Sternotomy wires are intact. Both lungs are clear. The visualized skeletal structures are unremarkable. IMPRESSION: Stable appearance of the cardiomediastinal silhouette. No focal consolidation or pleural effusion. Electronically Signed   By: Larose HiresImran  Ahmed D.O.   On: 11/05/2021 09:35    Cardiac Studies   Cath 11/05/21   Mid LAD lesion is 100% stenosed.  SVG to LAD is occluded.  The distal LAD appears to fill by LIMA and retrograde flow from the diagonal.   Prox RCA lesion is 100% stenosed.  SVG to distal RCA is patent.  Mild in-stent restenosis in the RCA stents.   1st Diag lesion is 99% stenosed.  There appear to be left to left collaterals from the distal LAD.   2nd Mrg lesion is 100% stenosed.  There are left to left collaterals filling this vessel.   Mid LM to Ost LAD lesion is 80% stenosed which extended into the proximal circumflex.  Significant calcium by IVUS.   A drug-eluting stent was  successfully placed from the proximal circumflex to the proximal left main using a SYNERGY XD 3.50X16, postdilated to greater than 4.5 mm in the left main.   Post intervention, there is a 0% residual stenosis.   LV end diastolic pressure is normal.   There is no aortic valve stenosis.  Complex lesion in the left main extending into the circumflex.  Successful stent placement after Cutting Balloon angioplasty.  Significant tortuosity in the mid circumflex made wiring the vessel difficult.   She will need aggressive secondary prevention.  Plavix was added.  She is mildly anemic.  If no significant bleeding issues, she can restart Eliquis tomorrow.  She had been off of Eliquis less than 24 hours prior to this procedure.  Given her anemia, will likely hold aspirin.   Aggressive hydration post cath.  Diagnostic Dominance: Co-dominant Intervention   Patient Profile     66 y.o. female with a past medical history of CAD (s/p CABG in 2005 with multiple interventions since, catheterization in 05/2015 showing occluded SVG-LAD and SVG-LCx with patent LIMA-D1 and patent SVG-RCA with no anatomy amenable to PCI, no significant ischemia by NST in 06/2018), HFrEF (EF 35-40% by cath in 2016, at 55-60% in 06/2018, 40-45% in 07/2021 and 45-50% in 08/2021), PVC's (on Mexiletine), persistent atrial fibrillation (s/p DCCV in 07/2021) HTN, HLD, carotid artery stenosis, and COPD who was seen 11/05/2021 for the evaluation of NSTEMI at the request of Dr. Doren Custard. Underwent cath on 11/05/21 with PCI/stenting of proximal circumflex  Assessment & Plan    NSTEMI: Patient has extensive CAD history with history of CABG in 2005 and multiple interventions since - No specific EKG changes, had frequent PVCs on telemetry  - HSTN 158>>210  - Patient had ongoing chest pain, did not tolerate NG drip due to severe headache.  - Underwent cardiac cath on 11/05/21 with successful stent placement in the proximal circumflex after Cutting  Balloon angioplasty - Will need aggressive risk factor modification  - Patient is slightly anemic. Hemoglobin 8.5 this AM (down from 9.1) but was given IV hydration post cath. Slight drop in hemoglobin likely dilutional. Patient is on eliquis, will restart today. On plavix. Per cath report, can hold asa due to anemia  - On Toprol-XL 25 mg daily, crestor 5mg , zetia 10 mg, entresto  - Can only tolerate low dose of crestor, LDL 135 on 11/06/21. Referred to lipid clinic  - Patient complained of 6/10 chest pain with nausea this AM. Resolved with 1 dose SL nitro - On imdur 60 mg daily PTA. Will restart for anginal relief  - On amlodipine 10 mg PTA, will continue to monitor BP and consider adding back as BP tolerates (BP 115/67 this AM, however was higher yesterday)   Hypokalemia  - K 3.4 this AM, given 40 mEq   HF with improved EF  - EF previously 35-40% in 2016, improved to 55-60% in 2019. Was most recently 45-50% in 08/2021.  - Euvolemic on exam - On entresto, toprol PTA, continue  - On lasix PRN for swelling at home   Persistent Atrial fibrillation  - S/p DCCV in 07/2021  - Maintaining Sinus rhythm per EKG, telemetry  - On toprol - Eliquis was held for cath, can resume today   HLD  - Intolerant of high intensity statin therapy  - On crestor 5mg , zetia  - Referral to lipid clinic   For questions or updates, please contact [facility] Please consult [email or web address] for contact info under        Signed, Margie Billet, PA-C  11/06/2021, 8:28 AM

## 2021-11-06 NOTE — Progress Notes (Signed)
Pt extremely anxious and restless, unable to lie still in bed, constantly moving her extremities and c/o restless legs and the inability to get comfortable in bed.  Also c/o lower back pain.  MD notified, and order received for xanax and percocet.  Will continue to monitor.  [name]

## 2021-11-06 NOTE — TOC Initial Note (Addendum)
Transition of Care [facility]) - Initial/Assessment Note    Patient Details  Name: [name] MRN: [id number] Date of Birth: November 17, 1955  Transition of Care [facility] CM/SW Contact:    Verdell Carmine, RN Phone Number: 11/06/2021, 4:59 PM  Clinical Narrative:                  The Transition of Care Department [facility]) has reviewed patient and no TOC needs have been identified at this time. We will continue to monitor patient advancement through interdisciplinary progression rounds. If new patient transition needs arise, please place a TOC consult   CRP referral made to [facility] by Cardiac Rehab     Patient Goals and CMS Choice        Expected Discharge Plan and Services                                                Prior Living Arrangements/Services                       Activities of Daily Living Home Assistive Devices/Equipment: None ADL Screening (condition at time of admission) Patient's cognitive ability adequate to safely complete daily activities?: Yes Is the patient deaf or have difficulty hearing?: No Does the patient have difficulty seeing, even when wearing glasses/contacts?: No Does the patient have difficulty concentrating, remembering, or making decisions?: No Patient able to express need for assistance with ADLs?: Yes Does the patient have difficulty dressing or bathing?: No Independently performs ADLs?: Yes (appropriate for developmental age) Does the patient have difficulty walking or climbing stairs?: No Weakness of Legs: None Weakness of Arms/Hands: None  Permission Sought/Granted                  Emotional Assessment              Admission diagnosis:  NSTEMI (non-ST elevated myocardial infarction) [facility]) [I21.4] Patient Active Problem List   Diagnosis Date Noted   NSTEMI (non-ST elevated myocardial infarction) ([place]) 11/05/2021   Persistent atrial fibrillation ([place]) with DCCV 07/30/21 maintaining SR 08/15/2021    Anticoagulation adequate 08/15/2021   Shock ([place]) hypovolemic [id number]   Chronic systolic heart failure ([place]) 08/13/2021   Ventricular bigeminy 08/12/2021   Encounter to establish care 09/12/2020   Anxiety 09/12/2020   Insomnia 09/12/2020   Hypothyroidism 07/03/2020   Ischemic cardiomyopathy 07/03/2020   Erroneous encounter - disregard 09/17/2015   CAD S/P multiple PCI after CABG 06/04/2015   Obesity-BMI 41 06/04/2015   Unstable angina ([place]) 06/03/2015   Angina, class III - progressed despite medical therapy 10/01/2013   LLQ abdominal pain 12/02/2011   Epigastric pain 04/19/2011   Anemia 04/19/2011   Diverticulitis 01/15/2011   DIARRHEA, CHRONIC 07/14/2010   Hx of CABG 2005 01/23/2010   Headache 01/23/2010   Hyperlipidemia with target LDL less than 70 05/27/2009   Essential hypertension 05/27/2009   PVD- 50-69% bilat ICA  05/27/2009   Chronic obstructive pulmonary disease (COPD) ([place]) 05/27/2009   GERD 05/27/2009   Seizure ([place]) 05/27/2009   PCP:  Arnoldo Lenis, MD Pharmacy:   [place], [place] [id number]  [place] [id number] Phone: [phone] Fax: [phone]  [facility] [address] [place] [address] Phone: [phone] Fax: [phone]     Social Determinants  of Health (SDOH) Interventions    Readmission Risk Interventions No flowsheet data found.

## 2021-11-06 NOTE — Progress Notes (Signed)
C/o "squeezing" CP 6/10 and nausea.  O2 via [place] applied at 3L/min.  NTG SL x 1 given, and symptoms resolved.  EKG obtained, no significant changes noted.  Irene Limbo, PA notified of the above.  Will continue to monitor.  Alonza Bogus

## 2021-11-06 NOTE — Progress Notes (Signed)
CARDIAC REHAB PHASE I   Offered to educate/walk with pt. Pt crying c/o sever headache. Offered support and encouragement. Pt still very tearful requesting to return later. Will f/u as able.  [id number] Reynold Bowen, RN BSN 11/06/2021 10:36 AM

## 2021-11-06 NOTE — Progress Notes (Signed)
Pt c/o headaches and/or chest discomfort throughout the day. The headaches resolved after eating. Incidentally, that's when the chest discomfort started. MDs aware. Protonix ordered. Call bell placed within reach. Will continue to monitor and maintain safety.

## 2021-11-06 NOTE — Progress Notes (Addendum)
CARDIAC REHAB PHASE I   Offered to walk with pt, pt states she is resting some after a rough morning. HA and CP improved. MI/stent education completed with pt. Pt educated on importance of Plavix and Eliquis. Pt given MI book along with heart healthy diet. Reviewed site care, restrictions, and exercise guidelines. Will refer to CRP II [place].   [id number] Rufina Falco, RN BSN 11/06/2021 1:53 PM

## 2021-11-06 NOTE — Plan of Care (Signed)

## 2021-11-07 ENCOUNTER — Other Ambulatory Visit (HOSPITAL_COMMUNITY): Payer: Self-pay

## 2021-11-07 DIAGNOSIS — I10 Essential (primary) hypertension: Secondary | ICD-10-CM

## 2021-11-07 DIAGNOSIS — I5022 Chronic systolic (congestive) heart failure: Secondary | ICD-10-CM | POA: Diagnosis not present

## 2021-11-07 DIAGNOSIS — I2510 Atherosclerotic heart disease of native coronary artery without angina pectoris: Secondary | ICD-10-CM | POA: Diagnosis not present

## 2021-11-07 DIAGNOSIS — T82855A Stenosis of coronary artery stent, initial encounter: Secondary | ICD-10-CM | POA: Diagnosis not present

## 2021-11-07 DIAGNOSIS — D649 Anemia, unspecified: Secondary | ICD-10-CM

## 2021-11-07 DIAGNOSIS — I214 Non-ST elevation (NSTEMI) myocardial infarction: Secondary | ICD-10-CM | POA: Diagnosis not present

## 2021-11-07 DIAGNOSIS — Z20822 Contact with and (suspected) exposure to covid-19: Secondary | ICD-10-CM | POA: Diagnosis not present

## 2021-11-07 DIAGNOSIS — I472 Ventricular tachycardia, unspecified: Secondary | ICD-10-CM | POA: Diagnosis not present

## 2021-11-07 LAB — CBC
HCT: 24.2 % — ABNORMAL LOW (ref 36.0–46.0)
HCT: 24.7 % — ABNORMAL LOW (ref 36.0–46.0)
Hemoglobin: 8 g/dL — ABNORMAL LOW (ref 12.0–15.0)
Hemoglobin: 7.9 g/dL — ABNORMAL LOW (ref 12.0–15.0)
MCH: 30.1 pg (ref 26.0–34.0)
MCH: 29.3 pg (ref 26.0–34.0)
MCHC: 33.1 g/dL (ref 30.0–36.0)
MCHC: 32 g/dL (ref 30.0–36.0)
MCV: 91 fL (ref 80.0–100.0)
MCV: 91.5 fL (ref 80.0–100.0)
Platelets: 244 10*3/uL (ref 150–400)
Platelets: 248 10*3/uL (ref 150–400)
RBC: 2.66 MIL/uL — ABNORMAL LOW (ref 3.87–5.11)
RBC: 2.7 MIL/uL — ABNORMAL LOW (ref 3.87–5.11)
RDW: 13.1 % (ref 11.5–15.5)
RDW: 13.2 % (ref 11.5–15.5)
WBC: 6.9 10*3/uL (ref 4.0–10.5)
WBC: 7 10*3/uL (ref 4.0–10.5)
nRBC: 0 % (ref 0.0–0.2)
nRBC: 0 % (ref 0.0–0.2)

## 2021-11-07 LAB — BASIC METABOLIC PANEL
Anion gap: 8 (ref 5–15)
BUN: 8 mg/dL (ref 8–23)
CO2: 23 mmol/L (ref 22–32)
Calcium: 8.6 mg/dL — ABNORMAL LOW (ref 8.9–10.3)
Chloride: 104 mmol/L (ref 98–111)
Creatinine, Ser: 0.75 mg/dL (ref 0.44–1.00)
GFR, Estimated: >60 mL/min (ref >60)
Glucose, Bld: 100 mg/dL — ABNORMAL HIGH (ref 70–99)
Potassium: 3.9 mmol/L (ref 3.5–5.1)
Sodium: 135 mmol/L (ref 135–145)

## 2021-11-07 LAB — MAGNESIUM: Magnesium: 1.9 mg/dL (ref 1.7–2.4)

## 2021-11-07 MED ORDER — PANTOPRAZOLE SODIUM 40 MG PO TBEC
40.0000 mg | DELAYED_RELEASE_TABLET | Freq: Every day | ORAL | 3 refills | Status: AC
  Filled 2021-11-07: qty 90, 90d supply, fill #0

## 2021-11-07 MED ORDER — AMLODIPINE BESYLATE 10 MG PO TABS
5.0000 mg | ORAL_TABLET | Freq: Every day | ORAL | 3 refills | Status: DC
  Filled 2021-11-07: qty 45, 90d supply, fill #0

## 2021-11-07 MED ORDER — APIXABAN 5 MG PO TABS: 5.0000 mg | ORAL_TABLET | Freq: Two times a day (BID) | ORAL | 6 refills | Status: DC

## 2021-11-07 MED ORDER — ROSUVASTATIN CALCIUM 5 MG PO TABS
5.0000 mg | ORAL_TABLET | Freq: Every day | ORAL | 3 refills | Status: DC
  Filled 2021-11-07: qty 90, 90d supply, fill #0

## 2021-11-07 MED ORDER — MAGNESIUM SULFATE 2 GM/50ML IV SOLN
2.0000 g | Freq: Once | INTRAVENOUS | Status: AC
  Administered 2021-11-07: 2 g via INTRAVENOUS
  Filled 2021-11-07: qty 50

## 2021-11-07 MED ORDER — POTASSIUM CHLORIDE CRYS ER 20 MEQ PO TBCR
20.0000 meq | EXTENDED_RELEASE_TABLET | Freq: Once | ORAL | Status: AC
  Administered 2021-11-07: 20 meq via ORAL
  Filled 2021-11-07: qty 1

## 2021-11-07 MED ORDER — CLOPIDOGREL BISULFATE 75 MG PO TABS
75.0000 mg | ORAL_TABLET | Freq: Every day | ORAL | 3 refills | Status: AC
  Filled 2021-11-07: qty 90, 90d supply, fill #0

## 2021-11-07 MED FILL — Clopidogrel Bisulfate Tab 300 MG (Base Equiv): ORAL | Qty: 2 | Status: AC

## 2021-11-07 NOTE — Progress Notes (Signed)
Pt had 4 beats of VTach. Pt is asymptomatic. Will continue to monitor pt.   11/07/21 0319  Vitals  Temp 98.3 F (36.8 C)  Temp Source Oral  BP 122/69  MAP (mmHg) 85  BP Location Right Arm  BP Method Automatic  Patient Position (if appropriate) Lying  Pulse Rate 71  Pulse Rate Source Monitor  ECG Heart Rate 64  Resp 15  MEWS COLOR  MEWS Score Color Green  Oxygen Therapy  SpO2 100 %  Height and Weight  Weight 86.1 kg  Type of Scale Used Standing  Type of Weight Actual  BMI (Calculated) 33.65  MEWS Score  MEWS Temp 0  MEWS Systolic 0  MEWS Pulse 0  MEWS RR 0  MEWS LOC 0  MEWS Score 0

## 2021-11-07 NOTE — Progress Notes (Signed)
Progress Note  Patient Name: [name] Date of Encounter: 11/07/2021  [facility] Cardiologist: Carlyle Dolly, MD   Subjective   O2 applied overnight for nocturnal hypoxia. Pt tired, not sleeping well here. Denies chest pain and active bleeding.  Inpatient Medications    Scheduled Meds:  amLODipine  5 mg Oral Daily   apixaban  5 mg Oral BID   clopidogrel  75 mg Oral Q breakfast   ezetimibe  10 mg Oral Daily   levETIRAcetam  250 mg Oral BID   levothyroxine  75 mcg Oral QAC breakfast   metoprolol succinate  25 mg Oral Daily   mexiletine  150 mg Oral Q8H   pantoprazole  40 mg Oral Daily   rosuvastatin  5 mg Oral q1800   sacubitril-valsartan  1 tablet Oral BID   sodium chloride flush  3 mL Intravenous Q12H   sodium chloride flush  3 mL Intravenous Q12H   Continuous Infusions:  sodium chloride     sodium chloride Stopped (11/05/21 1800)   sodium chloride     magnesium sulfate bolus IVPB 2 g (11/07/21 0911)   PRN Meds: sodium chloride, sodium chloride, acetaminophen, albuterol, diphenhydrAMINE, fluticasone, guaiFENesin-dextromethorphan, nitroGLYCERIN, ondansetron (ZOFRAN) IV, sodium chloride flush, sodium chloride flush, zolpidem   Vital Signs    Vitals:   11/06/21 1505 11/06/21 2028 11/07/21 0319 11/07/21 0800  BP: (!) 114/54 140/68 122/69 (!) 118/58  Pulse: 74 83 71   Resp: 18 16 15 16   Temp: 99.2 F (37.3 C) 99.3 F (37.4 C) 98.3 F (36.8 C) 98.6 F (37 C)  TempSrc: Oral Oral Oral Oral  SpO2: 91% 98% 100%   Weight:   86.1 kg   Height:       No intake or output data in the 24 hours ending 11/07/21 0912 Last 3 Weights 11/07/2021 11/06/2021 11/05/2021  Weight (lbs) 189 lb 14.4 oz 194 lb 8 oz 192 lb  Weight (kg) 86.138 kg 88.225 kg 87.091 kg      Telemetry    Sinus rhythm with PVC, HR 70s - Personally Reviewed  ECG    No new tracings - Personally Reviewed  Physical Exam   GEN: No acute distress.   Neck: No JVD Cardiac: RRR, no murmurs, rubs, or  gallops.  Respiratory: Clear to auscultation bilaterally. GI: Soft, nontender, non-distended  MS: No edema; No deformity. Neuro:  Nonfocal  Psych: Normal affect  Cath site C/D/I  Labs    High Sensitivity Troponin:   Recent Labs  Lab 11/05/21 0912 11/05/21 1057  TROPONINIHS 158* 210*     Chemistry Recent Labs  Lab 11/05/21 0912 11/06/21 0127 11/07/21 0754  NA 135 135 135  K 3.7 3.4* 3.9  CL 106 104 104  CO2 23 22 23   GLUCOSE 106* 119* 100*  BUN 13 10 8   CREATININE 0.70 0.71 0.75  CALCIUM 8.5* 8.4* 8.6*  MG 2.1  --  1.9  PROT 6.8  --   --   ALBUMIN 3.8  --   --   AST 23  --   --   ALT 17  --   --   ALKPHOS 65  --   --   BILITOT 0.1*  --   --   GFRNONAA >60 >60 >60  ANIONGAP 6 9 8     Lipids  Recent Labs  Lab 11/06/21 0127  CHOL 229*  TRIG 152*  HDL 64  LDLCALC 135*  CHOLHDL 3.6    Hematology Recent Labs  Lab  11/06/21 0127 11/07/21 0311 11/07/21 0754  WBC 8.6 6.9 7.0  RBC 2.81* 2.66* 2.70*  HGB 8.5* 8.0* 7.9*  HCT 25.7* 24.2* 24.7*  MCV 91.5 91.0 91.5  MCH 30.2 30.1 29.3  MCHC 33.1 33.1 32.0  RDW 13.1 13.1 13.2  PLT 259 244 248   Thyroid No results for input(s): TSH, FREET4 in the last 168 hours.  BNP Recent Labs  Lab 11/05/21 0912  BNP 547.0*    DDimer No results for input(s): DDIMER in the last 168 hours.   Radiology    CARDIAC CATHETERIZATION  Result Date: 11/05/2021   Mid LAD lesion is 100% stenosed.  SVG to LAD is occluded.  The distal LAD appears to fill by LIMA and retrograde flow from the diagonal.   Prox RCA lesion is 100% stenosed.  SVG to distal RCA is patent.  Mild in-stent restenosis in the RCA stents.   1st Diag lesion is 99% stenosed.  There appear to be left to left collaterals from the distal LAD.   2nd Mrg lesion is 100% stenosed.  There are left to left collaterals filling this vessel.   Mid LM to Ost LAD lesion is 80% stenosed which extended into the proximal circumflex.  Significant calcium by IVUS.   A drug-eluting stent  was successfully placed from the proximal circumflex to the proximal left main using a SYNERGY XD 3.50X16, postdilated to greater than 4.5 mm in the left main.   Post intervention, there is a 0% residual stenosis.   LV end diastolic pressure is normal.   There is no aortic valve stenosis. Complex lesion in the left main extending into the circumflex.  Successful stent placement after Cutting Balloon angioplasty.  Significant tortuosity in the mid circumflex made wiring the vessel difficult. She will need aggressive secondary prevention.  Plavix was added.  She is mildly anemic.  If no significant bleeding issues, she can restart Eliquis tomorrow.  She had been off of Eliquis less than 24 hours prior to this procedure.  Given her anemia, will likely hold aspirin. Aggressive hydration post cath.   DG Chest Portable 1 View  Result Date: 11/05/2021 CLINICAL DATA:  Chest pain with weakness EXAM: PORTABLE CHEST 1 VIEW COMPARISON:  Chest radiograph dated August 12, 2021 FINDINGS: The heart size and mediastinal contours are within upper normal limits. Sternotomy wires are intact. Both lungs are clear. The visualized skeletal structures are unremarkable. IMPRESSION: Stable appearance of the cardiomediastinal silhouette. No focal consolidation or pleural effusion. Electronically Signed   By: Keane Police D.O.   On: 11/05/2021 09:35    Cardiac Studies   Left heart cath 11/05/21:   Mid LAD lesion is 100% stenosed.  SVG to LAD is occluded.  The distal LAD appears to fill by LIMA and retrograde flow from the diagonal.   Prox RCA lesion is 100% stenosed.  SVG to distal RCA is patent.  Mild in-stent restenosis in the RCA stents.   1st Diag lesion is 99% stenosed.  There appear to be left to left collaterals from the distal LAD.   2nd Mrg lesion is 100% stenosed.  There are left to left collaterals filling this vessel.   Mid LM to Ost LAD lesion is 80% stenosed which extended into the proximal circumflex.  Significant  calcium by IVUS.   A drug-eluting stent was successfully placed from the proximal circumflex to the proximal left main using a SYNERGY XD 3.50X16, postdilated to greater than 4.5 mm in the left main.  Post intervention, there is a 0% residual stenosis.   LV end diastolic pressure is normal.   There is no aortic valve stenosis.   Complex lesion in the left main extending into the circumflex.  Successful stent placement after Cutting Balloon angioplasty.  Significant tortuosity in the mid circumflex made wiring the vessel difficult.   She will need aggressive secondary prevention.  Plavix was added.  She is mildly anemic.  If no significant bleeding issues, she can restart Eliquis tomorrow.  She had been off of Eliquis less than 24 hours prior to this procedure.  Given her anemia, will likely hold aspirin.   Aggressive hydration post cath.  Patient Profile     66 y.o. female with past medical history of CAD (s/p CABG in 2005 with multiple interventions since, catheterization in 05/2015 showing occluded SVG-LAD and SVG-LCx with patent LIMA-D1 and patent SVG-RCA with no anatomy amenable to PCI, no significant ischemia by NST in 06/2018), HFrEF (EF 35-40% by cath in 2016, at 55-60% in 06/2018, 40-45% in 07/2021 and 45-50% in 08/2021), PVC's (on Mexiletine), persistent atrial fibrillation (s/p DCCV in 07/2021) HTN, HLD, carotid artery stenosis, and COPD who is being seen 11/05/2021 for the evaluation of NSTEMI.  Assessment & Plan    NSTEMI CAD s/p CABG (2005) s/p PCI of SVG-RCA Pt was taken to the cath lab for definitive angiography after HST trended to 210. [place] showed patent LIMA-LAD, occluded SVG-RCA and SVG-distal LAD, patent DES in SVG-RCA. She had 80% stenosis in the distal left main extending into the Cx treated with DES. She was started on plavix and eliquis was restarted yesterday.      Anemia Hb baseline appears to be near 9-10, Hb 8.5 --> 8.0 --> 7.9 Eliquis restarted yesterday, has  received 2 doses. Hb continues to drift down - will consult GI.  Pt denies any active bleeding.     Hypertension Imdur stopped (headache) and amlodipine reduced to 5 mg daily. Continue entresto.     Hyperlipidemia with LDL goal < 55 Given her progression of disease, would prefer her LDL be closer to 55. She does not tolerate higher doses of crestor. Referral made to lipid clinic for PCSK9i.      Persistent atrial fibrillation Chronic anticoagulation DCCV 07/2021 - maintaining NSR. Continue toprol and eliquis.      PVCs Mg 1.9, K 3.9 Continue BB and mexiletiine 150 mg TID.     Chronic systolic and diastolic heart failure LVEF 35-40% by cath in 2016 --> 55-60% 06/2018 --> 40-45% 07/2021, 45-50% in 08/2021. BNP on admission was 547. Continue toprol and entresto. Continue PRN lasix. She appears euvolemic today.     Obesity Nocturnal hypoxia O2 dropped to 88% last night - consider sleep study outpatient.     For questions or updates, please contact [place] Please consult [email or web address] for contact info under        Signed, Ledora Bottcher, PA  11/07/2021, 9:12 AM

## 2021-11-07 NOTE — Progress Notes (Signed)
CARDIAC REHAB PHASE I   PRE:  Rate/Rhythm: 70 SR  BP:  Sitting: 118/58      SaO2: 95 RA  MODE:  Ambulation: 470 ft   POST:  Rate/Rhythm: 116 ST  BP:  Sitting: 140/73    SaO2: 96 RA  Pt states she feels much better besides lack of sleep, agreeable to walk. Pt ambulated 44ft in hallway independently without difficulty. Denies CP, SOB, or dizziness. Pt states she "cannot believe how good she feels". Reinforced importance of Plavix, site care, and exercise guidelines. Referred to [facility] [place]. Pt hopeful to d/c today.  [id number] Rufina Falco, RN BSN 11/07/2021 8:49 AM

## 2021-11-07 NOTE — Discharge Summary (Signed)
Discharge Summary    Patient ID: JASLEN PISTILLI MRN: [id number]; DOB: 1956/06/22  Admit date: 11/05/2021 Discharge date: 11/07/2021  PCP:  Arnoldo Lenis, MD   [facility] Providers Cardiologist:  Carlyle Dolly, MD 1}     Discharge Diagnoses    Principal Problem:   NSTEMI (non-ST elevated myocardial infarction) [facility]) Active Problems:   Hyperlipidemia with target LDL less than 70   Essential hypertension   Chronic obstructive pulmonary disease (COPD) ([place])   Anemia   Angina, class III - progressed despite medical therapy   CAD S/P multiple PCI after CABG   Obesity-BMI 41   Ischemic cardiomyopathy   Chronic systolic heart failure ([place])   Persistent atrial fibrillation ([place]) with DCCV 07/30/21 maintaining SR   Anticoagulation adequate  Diagnostic Studies/Procedures    Left heart cath 11/05/21:   Mid LAD lesion is 100% stenosed.  SVG to LAD is occluded.  The distal LAD appears to fill by LIMA and retrograde flow from the diagonal.   Prox RCA lesion is 100% stenosed.  SVG to distal RCA is patent.  Mild in-stent restenosis in the RCA stents.   1st Diag lesion is 99% stenosed.  There appear to be left to left collaterals from the distal LAD.   2nd Mrg lesion is 100% stenosed.  There are left to left collaterals filling this vessel.   Mid LM to Ost LAD lesion is 80% stenosed which extended into the proximal circumflex.  Significant calcium by IVUS.   A drug-eluting stent was successfully placed from the proximal circumflex to the proximal left main using a SYNERGY XD 3.50X16, postdilated to greater than 4.5 mm in the left main.   Post intervention, there is a 0% residual stenosis.   LV end diastolic pressure is normal.   There is no aortic valve stenosis.   Complex lesion in the left main extending into the circumflex.  Successful stent placement after Cutting Balloon angioplasty.  Significant tortuosity in the mid circumflex made wiring the vessel difficult.   She will  need aggressive secondary prevention.  Plavix was added.  She is mildly anemic.  If no significant bleeding issues, she can restart Eliquis tomorrow.  She had been off of Eliquis less than 24 hours prior to this procedure.  Given her anemia, will likely hold aspirin.   Aggressive hydration post cath. _____________   History of Present Illness     NICHOLLETTE SHERER is a 66 y.o. female with past medical history of CAD (s/p CABG in 2005 with multiple interventions since, catheterization in 05/2015 showing occluded SVG-LAD and SVG-LCx with patent LIMA-D1 and patent SVG-RCA with no anatomy amenable to PCI, no significant ischemia by NST in 06/2018), HFrEF (EF 35-40% by cath in 2016, at 55-60% in 06/2018, 40-45% in 07/2021 and 45-50% in 08/2021), PVC's (on Mexiletine), persistent atrial fibrillation (s/p DCCV in 07/2021) HTN, HLD, carotid artery stenosis, and COPD who is being seen 11/05/2021 for the evaluation of NSTEMI.  Ms. Hassan was most recently admitted [place] in 08/2021 for hypovolemic shock with infectious work-up being negative. Symptoms and BP significantly improved with IVF and BP medications were adjusted during admission. Was restarted on Toprol-XL 25mg  daily, Entresto 24-26mg  BID and PRN Lasix with PTA Imdur being discontinued.    She presented to [facility] ED on 11/05/2021 for evaluation of chest pain for the past 4 days. She reports having progressive chest pain for the past week which was a cramping sensation along her chest and she would  take SL NTG with resolution of her symptoms. Last night, she developed worsening discomfort along her left pectoral region and took SL NTG throughout the night with brief improvement in pain but symptoms would return. Did not sleep due to her pain. Reports associated dyspnea. No specific orthopnea, PND or pitting edema. Last doses of her medications were at 2000 on 11/04/2021 and she did not take anything this AM, including Eliquis.    Initial labs show WBC  8.5, Hgb 9.1 (close to baseline when compared to prior values from 08/2021). Na+ 135, K+ 3.7 and creatinine 0.70. Mg 2.1.  BNP 547. Initial Hs Troponin 158 with repeat values pending. COVID pending. CXR with no acute findings. EKG shows NSR, HR 90 with PVC's and IVCD. On telemetry, she has been in NSR with frequent PVC's and couplets and brief NSVT with the longest being 8 beats.    IV Heparin has been ordered (not yet started) and she did receive ASA and Fentanyl. Was on NTG patch and eventually started on IV NTG but has developed a significant headache since this was started and is crying during the encounter due to her headache. Still with some chest discomfort as well which she describes as a cramp.   Hospital Course     Consultants: none  NSTEMI CAD s/p CABG (2005) s/p PCI of SVG-RCA Pt was taken to the cath lab for definitive angiography after HST trended to 210. [place] showed patent LIMA-LAD, occluded SVG-RCA and SVG-distal LAD, patent DES in SVG-RCA. She had 80% stenosis in the distal left main extending into the Cx treated with DES. She was started on plavix and eliquis was restarted yesterday. No ASA due to anemia.   Anemia Hb baseline appears to be near 9-10, Hb 8.5 --> 8.0 Eliquis restarted yesterday, has received 2 doses. Hb this morning 7.9, no active bleeding and no GI symptoms. Will hold eliquis for 3 days. No ASA.    Hypertension Imdur stopped (headache) and amlodipine reduced to 5 mg daily. Continue entresto.   Hyperlipidemia with LDL goal < 55 Given her progression of disease, would prefer her LDL be closer to 55. She does not tolerate higher doses of crestor. Referral made to lipid clinic for PCSK9i.    Persistent atrial fibrillation Chronic anticoagulation DCCV 07/2021 - maintaining NSR. Continue toprol and eliquis.  Repeat CBC showed Hb -    PVCs Mg 1.9, K 3.9 Continue BB and mexiletiine 150 mg TID.   Chronic systolic and diastolic heart failure LVEF 35-40% by  cath in 2016 --> 55-60% 06/2018 --> 40-45% 07/2021, 45-50% in 08/2021. BNP on admission was 547. Continue toprol and entresto. Continue PRN lasix. She appears euvolemic today.   Obesity Nocturnal hypoxia O2 dropped to 88% last night - consider sleep study outpatient.    Pt seen and examined by Dr. Martinique and felt stable for discharge. Follow up has been arranged.    Did the patient have an acute coronary syndrome (MI, NSTEMI, STEMI, etc) this admission?:  Yes                               AHA/ACC Clinical Performance & Quality Measures: Aspirin prescribed? - No - anemia, elquis ADP Receptor Inhibitor (Plavix/Clopidogrel, Brilinta/Ticagrelor or Effient/Prasugrel) prescribed (includes medically managed patients)? - Yes Beta Blocker prescribed? - Yes High Intensity Statin (Lipitor 40-80mg  or Crestor 20-40mg ) prescribed? - No - intolerance EF assessed during THIS hospitalization? - No - prior echo  For EF <40%, was ACEI/ARB prescribed? - Yes For EF <40%, Aldosterone Antagonist (Spironolactone or Eplerenone) prescribed? - No - Reason:  BP Cardiac Rehab Phase II ordered (including medically managed patients)? - Yes       The patient will be scheduled for a TOC follow up appointment in 7-14 days.  A message has been sent to the [facility] and Scheduling Pool at the office where the patient should be seen for follow up.  _____________  Discharge Vitals Blood pressure (!) 118/58, pulse 71, temperature 98.6 F (37 C), temperature source Oral, resp. rate 16, height 5\' 3"  (1.6 m), weight 86.1 kg, SpO2 100 %.  Filed Weights   11/05/21 0906 11/06/21 0440 11/07/21 0319  Weight: 87.1 kg 88.2 kg 86.1 kg   Physical Exam Constitutional:      Appearance: Normal appearance. She is obese. She is not ill-appearing.  HENT:     Head: Normocephalic and atraumatic.  Eyes:     Extraocular Movements: Extraocular movements intact.  Neck:     Vascular: No carotid bruit.  Cardiovascular:     Rate and  Rhythm: Normal rate and regular rhythm.     Heart sounds: Normal heart sounds.  Pulmonary:     Effort: Pulmonary effort is normal.     Breath sounds: Normal breath sounds.  Abdominal:     General: Abdomen is flat. Bowel sounds are normal.     Palpations: Abdomen is soft.  Musculoskeletal:     Right lower leg: No edema.     Left lower leg: No edema.  Skin:    General: Skin is warm and dry.  Neurological:     Mental Status: She is alert and oriented to person, place, and time.  Psychiatric:        Mood and Affect: Mood normal.   Left radial cath site C/D/I with expected bruising   Labs & Radiologic Studies    CBC Recent Labs    11/05/21 0912 11/06/21 0127 11/07/21 0311 11/07/21 0754  WBC 8.5   < > 6.9 7.0  NEUTROABS 7.1  --   --   --   HGB 9.1*   < > 8.0* 7.9*  HCT 28.5*   < > 24.2* 24.7*  MCV 94.4   < > 91.0 91.5  PLT 296   < > 244 248   < > = values in this interval not displayed.   Basic Metabolic Panel Recent Labs    11/05/21 0912 11/06/21 0127 11/07/21 0754  NA 135 135 135  K 3.7 3.4* 3.9  CL 106 104 104  CO2 23 22 23   GLUCOSE 106* 119* 100*  BUN 13 10 8   CREATININE 0.70 0.71 0.75  CALCIUM 8.5* 8.4* 8.6*  MG 2.1  --  1.9   Liver Function Tests Recent Labs    11/05/21 0912  AST 23  ALT 17  ALKPHOS 65  BILITOT 0.1*  PROT 6.8  ALBUMIN 3.8   No results for input(s): LIPASE, AMYLASE in the last 72 hours. High Sensitivity Troponin:   Recent Labs  Lab 11/05/21 0912 11/05/21 1057  TROPONINIHS 158* 210*    BNP Invalid input(s): POCBNP D-Dimer No results for input(s): DDIMER in the last 72 hours. Hemoglobin A1C No results for input(s): HGBA1C in the last 72 hours. Fasting Lipid Panel Recent Labs    11/06/21 0127  CHOL 229*  HDL 64  LDLCALC 135*  TRIG 152*  CHOLHDL 3.6   Thyroid Function Tests No results for input(s): TSH,  T4TOTAL, T3FREE, THYROIDAB in the last 72 hours.  Invalid input(s): FREET3 _____________  CARDIAC  CATHETERIZATION  Result Date: 11/05/2021   Mid LAD lesion is 100% stenosed.  SVG to LAD is occluded.  The distal LAD appears to fill by LIMA and retrograde flow from the diagonal.   Prox RCA lesion is 100% stenosed.  SVG to distal RCA is patent.  Mild in-stent restenosis in the RCA stents.   1st Diag lesion is 99% stenosed.  There appear to be left to left collaterals from the distal LAD.   2nd Mrg lesion is 100% stenosed.  There are left to left collaterals filling this vessel.   Mid LM to Ost LAD lesion is 80% stenosed which extended into the proximal circumflex.  Significant calcium by IVUS.   A drug-eluting stent was successfully placed from the proximal circumflex to the proximal left main using a SYNERGY XD 3.50X16, postdilated to greater than 4.5 mm in the left main.   Post intervention, there is a 0% residual stenosis.   LV end diastolic pressure is normal.   There is no aortic valve stenosis. Complex lesion in the left main extending into the circumflex.  Successful stent placement after Cutting Balloon angioplasty.  Significant tortuosity in the mid circumflex made wiring the vessel difficult. She will need aggressive secondary prevention.  Plavix was added.  She is mildly anemic.  If no significant bleeding issues, she can restart Eliquis tomorrow.  She had been off of Eliquis less than 24 hours prior to this procedure.  Given her anemia, will likely hold aspirin. Aggressive hydration post cath.   DG Chest Portable 1 View  Result Date: 11/05/2021 CLINICAL DATA:  Chest pain with weakness EXAM: PORTABLE CHEST 1 VIEW COMPARISON:  Chest radiograph dated August 12, 2021 FINDINGS: The heart size and mediastinal contours are within upper normal limits. Sternotomy wires are intact. Both lungs are clear. The visualized skeletal structures are unremarkable. IMPRESSION: Stable appearance of the cardiomediastinal silhouette. No focal consolidation or pleural effusion. Electronically Signed   By: Keane Police  D.O.   On: 11/05/2021 09:35   Disposition   Pt is being discharged home today in good condition.  Follow-up Plans & Appointments     Follow-up Information     Erma Heritage, PA-C Follow up on 11/26/2021.   Specialties: Physician Assistant, Cardiology Why: 2:30 pm Contact information: [address] [place]  [address] [phone]                Discharge Instructions     AMB Referral to Advanced Lipid Disorders Clinic   Complete by: As directed    Internal Lipid Clinic Referral Scheduling  Internal lipid clinic referrals are providers within [facility], who wish to refer established patients for routine management (help in starting PCSK9 inhibitor therapy) or advanced therapies.  Internal MD referral criteria:              1. All patients with LDL>190 mg/dL  2. All patients with Triglycerides >500 mg/dL  3. Patients with suspected or confirmed heterozygous familial hyperlipidemia (HeFH) or homozygous familial hyperlipidemia (HoFH)  4. Patients with family history of suspicious for genetic dyslipidemia desiring genetic testing  5. Patients refractory to standard guideline based therapy  6. Patients with statin intolerance (failed 2 statins, one of which must be a high potency statin)  7. Patients who the provider desires to be seen by MD   Internal PharmD referral criteria:   1. Follow-up patients for medication management  2. Follow-up for compliance monitoring  3. Patients for drug education  4. Patients with statin intolerance  5. PCSK9 inhibitor education and prior authorization approvals  6. Patients with triglycerides <500 mg/dL  External Lipid Clinic Referral  External lipid clinic referrals are for providers outside of [facility], considered new clinic patients - automatically routed to MD schedule   Amb Referral to Cardiac Rehabilitation   Complete by: As directed    Diagnosis:  Coronary Stents NSTEMI     After initial evaluation and  assessments completed: Virtual Based Care may be provided alone or in conjunction with Phase 2 Cardiac Rehab based on patient barriers.: Yes   Diet - low sodium heart healthy   Complete by: As directed    Discharge instructions   Complete by: As directed    No driving for 2 days. No lifting over 5 lbs for 1 week. No sexual activity for 1 week. You may return to work after follow up. Keep procedure site clean & dry. If you notice increased pain, swelling, bleeding or pus, call/return!  You may shower, but no soaking baths/hot tubs/pools for 1 week.   Follow up with your GI doctor in the next 2 weeks.   Increase activity slowly   Complete by: As directed        Discharge Medications   Allergies as of 11/07/2021       Reactions   Pneumococcal Vaccines Anxiety, Palpitations, Cough   Adhesive [tape] Itching, Other (See Comments)   Blisters (pls use paper tape)   Crestor [rosuvastatin] Other (See Comments)   Muscle pain   Lipitor [atorvastatin] Other (See Comments)   Cramps   Morphine And Related Itching   Pt states she takes with benadryl        Medication List     STOP taking these medications    aspirin 81 MG chewable tablet   isosorbide mononitrate 60 MG 24 hr tablet Commonly known as: IMDUR   omeprazole 20 MG capsule Commonly known as: PRILOSEC Replaced by: pantoprazole 40 MG tablet   prasugrel 10 MG Tabs tablet Commonly known as: EFFIENT       TAKE these medications    acetaminophen 650 MG CR tablet Commonly known as: TYLENOL Take 1,300 mg by mouth every 8 (eight) hours as needed for pain.   albuterol 108 (90 Base) MCG/ACT inhaler Commonly known as: VENTOLIN HFA Inhale 2 puffs into the lungs every 6 (six) hours as needed for wheezing or shortness of breath.   amLODipine 10 MG tablet Commonly known as: NORVASC Take 0.5 tablets (5 mg total) by mouth daily. What changed: how much to take   apixaban 5 MG Tabs tablet Commonly known as: ELIQUIS Take 1  tablet (5 mg total) by mouth 2 (two) times daily. Restart on 11/10/21 in the evening. What changed: additional instructions   cetirizine 10 MG tablet Commonly known as: ZyrTEC Allergy Take 1 tablet (10 mg total) by mouth daily.   clopidogrel 75 MG tablet Commonly known as: PLAVIX Take 1 tablet (75 mg total) by mouth daily with breakfast.   ezetimibe 10 MG tablet Commonly known as: ZETIA TAKE (1) TABLET BY MOUTH ONCE DAILY. What changed: See the new instructions.   furosemide 80 MG tablet Commonly known as: LASIX Take 1 tablet (80 mg total) by mouth daily as needed for edema (we changed to if needed because you were dehydrated).   hydrOXYzine 25 MG tablet Commonly known as: ATARAX Take 1 tablet (25 mg total) by  mouth every 6 (six) hours as needed for anxiety.   levETIRAcetam 250 MG tablet Commonly known as: KEPPRA TAKE (1) TABLET BY MOUTH TWICE DAILY.   levothyroxine 75 MCG tablet Commonly known as: SYNTHROID Take 1 tablet (75 mcg total) by mouth daily before breakfast.   metoprolol succinate 50 MG 24 hr tablet Commonly known as: TOPROL-XL TAKE (1) TABLET BY MOUTH ONCE DAILY. TAKE WITH OR IMMEDIATELY FOLLOWING A MEAL. What changed: Another medication with the same name was removed. Continue taking this medication, and follow the directions you see here.   mexiletine 150 MG capsule Commonly known as: MEXITIL Take 1 capsule (150 mg total) by mouth every 8 (eight) hours.   multivitamin with minerals Tabs tablet Take 1 tablet by mouth 2 (two) times daily. What changed: when to take this   nitroGLYCERIN 0.4 MG SL tablet Commonly known as: NITROSTAT DISSOLVE 1 TABLET UNDER TONGUE EVERY 5 MINUTES UP TO 15 MIN FOR CHESTPAIN. IF NO RELIEF CALL 911.   Omega 3 1000 MG Caps Take 1 capsule (1,000 mg total) by mouth 2 (two) times daily.   pantoprazole 40 MG tablet Commonly known as: PROTONIX Take 1 tablet (40 mg total) by mouth daily. Replaces: omeprazole 20 MG capsule    potassium chloride SA 20 MEQ tablet Commonly known as: KLOR-CON M Take 1 tablet (20 mEq total) by mouth daily as needed.   rosuvastatin 5 MG tablet Commonly known as: CRESTOR Take 1 tablet (5 mg total) by mouth daily.   sacubitril-valsartan 24-26 MG Commonly known as: ENTRESTO Take 1 tablet by mouth 2 (two) times daily.           Outstanding Labs/Studies   Check CBC OP  Duration of Discharge Encounter   Greater than 30 minutes including physician time.  Signed, Tami Lin Eddith Mentor, PA 11/07/2021, 10:15 AM

## 2021-11-08 ENCOUNTER — Other Ambulatory Visit: Payer: Self-pay | Admitting: Cardiology

## 2021-11-08 DIAGNOSIS — I10 Essential (primary) hypertension: Secondary | ICD-10-CM

## 2021-11-10 ENCOUNTER — Ambulatory Visit: Payer: Medicare Other | Admitting: Cardiology

## 2021-11-10 ENCOUNTER — Other Ambulatory Visit: Payer: Self-pay | Admitting: Cardiology

## 2021-11-14 ENCOUNTER — Ambulatory Visit (HOSPITAL_COMMUNITY): Payer: Medicaid Other

## 2021-11-18 ENCOUNTER — Other Ambulatory Visit (HOSPITAL_COMMUNITY): Payer: Self-pay

## 2021-11-19 ENCOUNTER — Other Ambulatory Visit (HOSPITAL_COMMUNITY): Payer: Self-pay

## 2021-11-19 ENCOUNTER — Telehealth (HOSPITAL_COMMUNITY): Payer: Self-pay

## 2021-11-19 NOTE — Telephone Encounter (Signed)
Pharmacy Transitions of Care Follow-up Telephone Call  Date of discharge: 11/07/21  Discharge Diagnosis: NSTEMI  How have you been since you were released from the hospital? She has been doing well, wanting to get some energy back, but already has more energy than before she was in the hospital.   Medication changes made at discharge: STOP taking these medications     aspirin 81 MG chewable tablet    isosorbide mononitrate 60 MG 24 hr tablet Commonly known as: IMDUR    omeprazole 20 MG capsule Commonly known as: PRILOSEC Replaced by: pantoprazole 40 MG tablet    prasugrel 10 MG Tabs tablet Commonly known as: EFFIENT           TAKE these medications     acetaminophen 650 MG CR tablet Commonly known as: TYLENOL Take 1,300 mg by mouth every 8 (eight) hours as needed for pain.    albuterol 108 (90 Base) MCG/ACT inhaler Commonly known as: VENTOLIN HFA Inhale 2 puffs into the lungs every 6 (six) hours as needed for wheezing or shortness of breath.    amLODipine 10 MG tablet Commonly known as: NORVASC Take 0.5 tablets (5 mg total) by mouth daily. What changed: how much to take    apixaban 5 MG Tabs tablet Commonly known as: ELIQUIS Take 1 tablet (5 mg total) by mouth 2 (two) times daily. Restart on 11/10/21 in the evening. What changed: additional instructions    cetirizine 10 MG tablet Commonly known as: ZyrTEC Allergy Take 1 tablet (10 mg total) by mouth daily.    clopidogrel 75 MG tablet Commonly known as: PLAVIX Take 1 tablet (75 mg total) by mouth daily with breakfast.    ezetimibe 10 MG tablet Commonly known as: ZETIA TAKE (1) TABLET BY MOUTH ONCE DAILY. What changed: See the new instructions.    furosemide 80 MG tablet Commonly known as: LASIX Take 1 tablet (80 mg total) by mouth daily as needed for edema (we changed to if needed because you were dehydrated).    hydrOXYzine 25 MG tablet Commonly known as: ATARAX Take 1 tablet (25 mg total) by mouth every  6 (six) hours as needed for anxiety.    levETIRAcetam 250 MG tablet Commonly known as: KEPPRA TAKE (1) TABLET BY MOUTH TWICE DAILY.    levothyroxine 75 MCG tablet Commonly known as: SYNTHROID Take 1 tablet (75 mcg total) by mouth daily before breakfast.    metoprolol succinate 50 MG 24 hr tablet Commonly known as: TOPROL-XL TAKE (1) TABLET BY MOUTH ONCE DAILY. TAKE WITH OR IMMEDIATELY FOLLOWING A MEAL. What changed: Another medication with the same name was removed. Continue taking this medication, and follow the directions you see here.    mexiletine 150 MG capsule Commonly known as: MEXITIL Take 1 capsule (150 mg total) by mouth every 8 (eight) hours.    multivitamin with minerals Tabs tablet Take 1 tablet by mouth 2 (two) times daily. What changed: when to take this    nitroGLYCERIN 0.4 MG SL tablet Commonly known as: NITROSTAT DISSOLVE 1 TABLET UNDER TONGUE EVERY 5 MINUTES UP TO 15 MIN FOR CHESTPAIN. IF NO RELIEF CALL 911.    Omega 3 1000 MG Caps Take 1 capsule (1,000 mg total) by mouth 2 (two) times daily.    pantoprazole 40 MG tablet Commonly known as: PROTONIX Take 1 tablet (40 mg total) by mouth daily. Replaces: omeprazole 20 MG capsule    potassium chloride SA 20 MEQ tablet Commonly known as: KLOR-CON M Take 1  tablet (20 mEq total) by mouth daily as needed.    rosuvastatin 5 MG tablet Commonly known as: CRESTOR Take 1 tablet (5 mg total) by mouth daily.    sacubitril-valsartan 24-26 MG Commonly known as: ENTRESTO Take 1 tablet by mouth 2 (two) times daily.    Medication changes verified by the patient? yes     Medication Accessibility:  Home Pharmacy: [facility]. [place], [place]  Was the patient provided with refills on discharged medications? Y   Have all prescriptions been transferred from [facility] to home pharmacy? y   Is the patient able to afford medications? y Notable copays: 1.45    Medication Review:   APIXABAN (ELIQUIS)  Apixaban  5 mg BID initiated on 11/06/21  - Discussed importance of taking medication around the same time everyday  - Reviewed potential DDIs with patient  - Advised patient of medications to avoid (NSAIDs, ASA)  - Educated that Tylenol (acetaminophen) will be the preferred analgesic to prevent risk of bleeding  - Emphasized importance of monitoring for signs and symptoms of bleeding (abnormal bruising, prolonged bleeding, nose bleeds, bleeding from gums, discolored urine, black tarry stools)  - Advised patient to alert all providers of anticoagulation therapy prior to starting a new medication or having a procedure   CLOPIDOGREL (PLAVIX) Clopidogrel 75 mg once daily.  - Educated patient on expected duration of therapy with clopidogrel. Advised patient that aspirin will not be continued due to anemia - Reviewed potential DDIs with patient  - Advised patient of medications to avoid (NSAIDs, ASA)  - Educated that Tylenol (acetaminophen) will be the preferred analgesic to prevent risk of bleeding  - Emphasized importance of monitoring for signs and symptoms of bleeding (abnormal bruising, prolonged bleeding, nose bleeds, bleeding from gums, discolored urine, black tarry stools)  - Advised patient to alert all providers of anticoagulation therapy prior to starting a new medication or having a procedure   Follow-up Appointments:   Date Visit Type Length Department   11/26/2021  2:30 PM OFFICE VISIT 30 min [facility] [[id number]]    If their condition worsens, is the pt aware to call PCP or go to the Emergency Dept.? Y  Final Patient Assessment: She has been doing well, wanting to get some energy back, but already has more energy than before she was in the hospital. Understand all medications.

## 2021-11-25 NOTE — Progress Notes (Signed)
Cardiology Office Note    Date:  11/26/2021   ID:  [name], DOB 1956/02/13, MRN [id number]  PCP:  Neale Burly, MD  Cardiologist: Carlyle Dolly, MD    Chief Complaint  Patient presents with   Hospitalization Follow-up    History of Present Illness:    [name] is a 66 y.o. female  with past medical history of CAD (s/p CABG in 2005 with multiple interventions since, catheterization in 05/2015 showing occluded SVG-LAD and SVG-LCx with patent LIMA-D1 and patent SVG-RCA with no anatomy amenable to PCI, no significant ischemia by NST in 06/2018), HFrEF (EF 35-40% by cath in 2016, at 55-60% in 06/2018, 40-45% in 07/2021 and 45-50% in 08/2021), PVC's (on Mexiletine), persistent atrial fibrillation (s/p DCCV in 07/2021) HTN, HLD, carotid artery stenosis and COPD who presents to the office today for hospital follow-up.  She most recently presented to [facility] ED on 11/05/2021 for evaluation of worsening chest pain for the past 3 to 4 days prior to admission. She was found to have an NSTEMI with Hs Troponin up to 210 and given ongoing chest pain while in the ED, was sent for an urgent cardiac catheterization the same day. This showed an 80% stenosis along the left main and extending into the LCx which was treated with cutting balloon angioplasty followed by stent placement. She did have a baseline anemia and hemoglobin was at 7.9 on the day of discharge. She was started on Plavix 75 mg daily and restarted on Eliquis but was not placed on ASA due to her anemia.  In talking with the patient today, she denies any recurrent chest pain resembling her presenting symptoms. She does report dyspnea on exertion and fatigue since hospital discharge and reports her fatigue worsens after she takes her morning medications. Says that her BP has been soft at times with SBP in the 90's. Her BP is at 102/54 during today's visit. She reports associated dizziness with positional changes as well. No  specific orthopnea, PND or pitting edema. She has been experiencing more depression and anxiety and is planning to discuss this with her new PCP next week. Was previously on Prozac but has been off of this for over a year.    Past Medical History:  Diagnosis Date   Acute renal failure (HCC)    Anemia    Anticoagulation adequate 08/15/2021   Anxiety    Arthritis    Asthma    Carotid artery disease ([place])    a. Duplex 50-69% BICA 03/2014.   Chronic back pain    Collagen vascular disease (HCC)    COPD (chronic obstructive pulmonary disease) (HCC)    Coronary artery disease    a. s/p prior CABG 2005. b. BMS 02/2009 to VG-PDA. c. NSTEMI 08/2010 s/p PTCA/DES to SVG-PDA in previously stented area. d. DES to SVG to RCA on 05/24/14  e. LHC 06/04/2015 w/ no sig change in her anatomy and Rx managment f. cath in 11/2021 with DES to LM   DDD (degenerative disc disease)    Depression    Diverticulitis    4 documented episodes by CT; most recent April 2012   DIVERTICULITIS OF COLON 07/14/2010   Qualifier: Diagnosis of  By: Westly Pam.    GERD (gastroesophageal reflux disease)    History of blood transfusion    HTN (hypertension)    Hyperlipidemia    Hypertension    Hypothyroidism    Ischemic cardiomyopathy    a. EF  40% in 2012 by cath. b. improved to 50-55% by cath 05/2014  c. EF 35-40% by [facility] 05/2015   Kidney stones    Persistent atrial fibrillation ([place]) with DCCV 07/30/21 maintaining SR 08/15/2021   Seizures ([place])    Shock ([place]) hypovolemic 08/13/2021   Sinus bradycardia    Sleep apnea    a. Mild-to-moderate obstructive sleep apnea diagnosed in April 2012 by sleep study.   Tension headache     Past Surgical History:  Procedure Laterality Date   APPENDECTOMY  ~ [place]  Jan 2012   patent stents in RCA graft; CAD stable   CARDIAC CATHETERIZATION N/A 06/03/2015   Procedure: Left Heart Cath and Cors/Grafts Angiography;  Surgeon: Belva Crome, MD;  Location:  [place] [facility];  Service: Cardiovascular;  Laterality: N/A;   CARPAL TUNNEL WITH CUBITAL TUNNEL Right 2001   CORONARY ANGIOPLASTY WITH STENT PLACEMENT  2011   Promus Premier DES, 3.5 mm x 12 mm, for 99% proximal stent ISR in the SVG-RPDA    CORONARY ANGIOPLASTY WITH STENT PLACEMENT  05/24/2014   CORONARY ARTERY BYPASS GRAFT  2005   LIMA to AL, SVG to LAD, SVG to Cx, SVG-PDA   CORONARY STENT INTERVENTION N/A 11/05/2021   Procedure: CORONARY STENT INTERVENTION;  Surgeon: Jettie Booze, MD;  Location: [place] [facility];  Service: Cardiovascular;  Laterality: N/A;   INTRAVASCULAR ULTRASOUND/IVUS N/A 11/05/2021   Procedure: Intravascular Ultrasound/IVUS;  Surgeon: Jettie Booze, MD;  Location: [place] [facility];  Service: Cardiovascular;  Laterality: N/A;   KNEE ARTHROSCOPY W/ ACL RECONSTRUCTION Left 2001   LAPAROSCOPIC CHOLECYSTECTOMY  2010   LEFT HEART CATH AND CORS/GRAFTS ANGIOGRAPHY N/A 11/05/2021   Procedure: LEFT HEART CATH AND CORS/GRAFTS ANGIOGRAPHY;  Surgeon: Jettie Booze, MD;  Location: [place] [facility];  Service: Cardiovascular;  Laterality: N/A;   LEFT HEART CATHETERIZATION WITH CORONARY/GRAFT ANGIOGRAM N/A 05/24/2014   Procedure: LEFT HEART CATHETERIZATION WITH Beatrix Fetters;  Surgeon: Leonie Man, MD; EF 50-55%, LAD & D1 100%, LIMA-D1 OK, CFX OK, OM1 & OM2 100%, lat OM branch 100%, RCA 100%; pSVG-RPDA 99% rx w/ scoring balloon PTCA & 3.5 x 12 mm Promus DES; SVG-LAD, SVG-OM known occluded       TUBAL LIGATION  ~ [place]  ~ 1993    Current Medications: Outpatient Medications Prior to Visit  Medication Sig Dispense Refill   acetaminophen (TYLENOL) 650 MG CR tablet Take 1,300 mg by mouth every 8 (eight) hours as needed for pain.     albuterol (VENTOLIN HFA) 108 (90 Base) MCG/ACT inhaler Inhale 2 puffs into the lungs every 6 (six) hours as needed for wheezing or shortness of breath.     apixaban (ELIQUIS) 5 MG TABS tablet Take 1  tablet (5 mg total) by mouth 2 (two) times daily. Restart on 11/10/21 in the evening. 60 tablet 6   cetirizine (ZYRTEC ALLERGY) 10 MG tablet Take 1 tablet (10 mg total) by mouth daily. 30 tablet 0   clopidogrel (PLAVIX) 75 MG tablet Take 1 tablet (75 mg total) by mouth daily with breakfast. 90 tablet 3   ezetimibe (ZETIA) 10 MG tablet TAKE (1) TABLET BY MOUTH ONCE DAILY. 90 tablet 0   furosemide (LASIX) 80 MG tablet Take 1 tablet (80 mg total) by mouth daily as needed for edema (we changed to if needed because you were dehydrated). 90 tablet 3   hydrOXYzine (ATARAX/VISTARIL) 25 MG tablet Take 1 tablet (25  mg total) by mouth every 6 (six) hours as needed for anxiety. 30 tablet 0   levETIRAcetam (KEPPRA) 250 MG tablet TAKE (1) TABLET BY MOUTH TWICE DAILY. 180 tablet 0   levothyroxine (SYNTHROID) 75 MCG tablet Take 1 tablet (75 mcg total) by mouth daily before breakfast. 90 tablet 3   metoprolol succinate (TOPROL-XL) 50 MG 24 hr tablet TAKE (1) TABLET BY MOUTH ONCE DAILY. TAKE WITH OR IMMEDIATELY FOLLOWING A MEAL. 90 tablet 1   mexiletine (MEXITIL) 150 MG capsule Take 1 capsule (150 mg total) by mouth every 8 (eight) hours. 90 capsule 6   Multiple Vitamin (MULTIVITAMIN WITH MINERALS) TABS tablet Take 1 tablet by mouth 2 (two) times daily. (Patient taking differently: Take 1 tablet by mouth daily.) 25 tablet 4   nitroGLYCERIN (NITROSTAT) 0.4 MG SL tablet DISSOLVE 1 TABLET UNDER TONGUE EVERY 5 MINUTES UP TO 15 MIN FOR CHESTPAIN. IF NO RELIEF CALL 911. 25 tablet 3   Omega 3 1000 MG CAPS Take 1 capsule (1,000 mg total) by mouth 2 (two) times daily.     pantoprazole (PROTONIX) 40 MG tablet Take 1 tablet (40 mg total) by mouth daily. 90 tablet 3   potassium chloride SA (KLOR-CON) 20 MEQ tablet Take 1 tablet (20 mEq total) by mouth daily as needed. 90 tablet 3   amLODipine (NORVASC) 10 MG tablet Take 0.5 tablets (5 mg total) by mouth daily. 90 tablet 3   rosuvastatin (CRESTOR) 5 MG tablet Take 1 tablet (5 mg  total) by mouth daily. 90 tablet 3   sacubitril-valsartan (ENTRESTO) 24-26 MG Take 1 tablet by mouth 2 (two) times daily. 56 tablet 0   No facility-administered medications prior to visit.     Allergies:   Pneumococcal vaccines, Adhesive [tape], Crestor [rosuvastatin], Lipitor [atorvastatin], and Morphine and related   Social History   Socioeconomic History   Marital status: Legally Separated    Spouse name: Not on file   Number of children: 3   Years of education: Not on file   Highest education level: Not on file  Occupational History   Occupation: disabled  Tobacco Use   Smoking status: Former    Packs/day: 0.50    Years: 21.00    Pack years: 10.50    Types: Cigarettes    Start date: 03/19/1993    Quit date: 07/29/2015    Years since quitting: 6.3   Smokeless tobacco: Never   Tobacco comments:    smokes some time/go weeks without smoking  Vaping Use   Vaping Use: Never used  Substance and Sexual Activity   Alcohol use: No    Alcohol/week: 0.0 standard drinks   Drug use: Not Currently    Types: Cocaine, Marijuana    Comment: last month   Sexual activity: Never  Other Topics Concern   Not on file  Social History Narrative   Lives in [place], [place].    Social Determinants of Health   Financial Resource Strain: Not on file  Food Insecurity: Not on file  Transportation Needs: Not on file  Physical Activity: Not on file  Stress: Not on file  Social Connections: Not on file     Family History:  The patient's family history includes Breast cancer in her mother; Heart attack in her father and mother; Heart disease in her father and mother.   Review of Systems:    Please see the history of present illness.     All other systems reviewed and are otherwise negative except as noted above.  Physical Exam:    VS:  BP (!) 102/54    Pulse 68    Ht 5\' 3"  (1.6 m)    Wt 189 lb 3.2 oz (85.8 kg)    SpO2 98%    BMI 33.52 kg/m    General: Well developed, well nourished,female  appearing in no acute distress. Head: Normocephalic, atraumatic. Neck: No carotid bruits. JVD not elevated.  Lungs: Respirations regular and unlabored, without wheezes or rales.  Heart: Regular rate and rhythm. No S3 or S4.  No murmur, no rubs, or gallops appreciated. Abdomen: Appears non-distended. No obvious abdominal masses. Msk:  Strength and tone appear normal for age. No obvious joint deformities or effusions. Extremities: No clubbing or cyanosis. No pitting edema.  Distal pedal pulses are 2+ bilaterally. Radial site without ecchymosis or evidence of a hematoma.  Neuro: Alert and oriented X 3. Moves all extremities spontaneously. No focal deficits noted. Psych:  Responds to questions appropriately with a normal affect. Skin: No rashes or lesions noted  Wt Readings from Last 3 Encounters:  11/26/21 189 lb 3.2 oz (85.8 kg)  11/07/21 189 lb 14.4 oz (86.1 kg)  08/15/21 192 lb 0.3 oz (87.1 kg)     Studies/Labs Reviewed:   EKG:  EKG is not  ordered today.  EKG from 11/07/2021 is reviewed and shows NSR, HR 72 with IVCD but no acute ST changes.   Recent Labs: 08/12/2021: TSH 2.344 11/05/2021: ALT 17; B Natriuretic Peptide 547.0 11/07/2021: Magnesium 1.9 11/26/2021: BUN 20; Creatinine, Ser 1.05; Hemoglobin 10.3; Platelets 408; Potassium 3.9; Sodium 129   Lipid Panel    Component Value Date/Time   CHOL 229 (H) 11/06/2021 0127   TRIG 152 (H) 11/06/2021 0127   HDL 64 11/06/2021 0127   CHOLHDL 3.6 11/06/2021 0127   VLDL 30 11/06/2021 0127   LDLCALC 135 (H) 11/06/2021 0127    Additional studies/ records that were reviewed today include:   Echo: 08/2021 IMPRESSIONS     1. Left ventricular ejection fraction, by estimation, is 45 to 50%. The  left ventricle has mildly decreased function. The left ventricle  demonstrates global hypokinesis. The left ventricular internal cavity size  was mildly dilated. Left ventricular  diastolic parameters are consistent with Grade II diastolic  dysfunction  (pseudonormalization).   2. Right ventricular systolic function is normal. The right ventricular  size is normal. Tricuspid regurgitation signal is inadequate for assessing  PA pressure.   3. Left atrial size was moderately dilated.   4. The mitral valve is normal in structure. Trivial mitral valve  regurgitation. No evidence of mitral stenosis.   5. The aortic valve is tricuspid. Aortic valve regurgitation is not  visualized. Mild aortic valve sclerosis is present, with no evidence of  aortic valve stenosis.   6. Aortic dilatation noted. There is mild dilatation of the ascending  aorta, measuring 38 mm.   7. The inferior vena cava is normal in size with greater than 50%  respiratory variability, suggesting right atrial pressure of 3 mmHg.   LHC: 11/2021 Mid LAD lesion is 100% stenosed.  SVG to LAD is occluded.  The distal LAD appears to fill by LIMA and retrograde flow from the diagonal.   Prox RCA lesion is 100% stenosed.  SVG to distal RCA is patent.  Mild in-stent restenosis in the RCA stents.   1st Diag lesion is 99% stenosed.  There appear to be left to left collaterals from the distal LAD.   2nd Mrg lesion is 100% stenosed.  There are left to left collaterals filling this vessel.   Mid LM to Ost LAD lesion is 80% stenosed which extended into the proximal circumflex.  Significant calcium by IVUS.   A drug-eluting stent was successfully placed from the proximal circumflex to the proximal left main using a SYNERGY XD [id number], postdilated to greater than 4.5 mm in the left main.   Post intervention, there is a 0% residual stenosis.   LV end diastolic pressure is normal.   There is no aortic valve stenosis.   Complex lesion in the left main extending into the circumflex.  Successful stent placement after Cutting Balloon angioplasty.  Significant tortuosity in the mid circumflex made wiring the vessel difficult.   She will need aggressive secondary prevention.  Plavix was  added.  She is mildly anemic.  If no significant bleeding issues, she can restart Eliquis tomorrow.  She had been off of Eliquis less than 24 hours prior to this procedure.  Given her anemia, will likely hold aspirin.   Aggressive hydration post cath.  Assessment:    1. Coronary artery disease involving native coronary artery of native heart without angina pectoris   2. Ischemic cardiomyopathy   3. PVC's (premature ventricular contractions)   4. Persistent atrial fibrillation ([place])   5. Hyperlipidemia with target LDL less than 70   6. Medication management      Plan:   In order of problems listed above:  1. CAD - She is s/p CABG in 2005 with multiple interventions since and recently underwent DES placement to the LM as outlined above. She does report fatigue and dizziness which I suspect is secondary to her hypotension but denies any recurrent chest pain resembling her presenting symptoms. - She remains on Plavix 75 mg daily and is not on ASA given the need for anticoagulation. Will repeat her CBC today. Continue Toprol-XL, Zetia and Crestor.   2.HFmrEF/Ischemic Cardiomyopathy - Her EF was at 45-50% in 08/2021. She appears euvolemic today and denies any orthopnea, PND or pitting edema. She does have an Rx for Lasix 80 mg as needed but has not utilized this recently.  Will continue Toprol-XL 50 mg daily along with Entresto 24-26 mg twice daily. Given her soft BP, will stop Amlodipine 5 mg daily. I did encourage her to follow readings at home as she may require dose adjustment of Toprol-XL if BP remains soft.  3. PVC's - She has a history of frequent PVC's and has been on both Toprol-XL 50mg  daily and Mexiletine 150mg  TID.   4. Persistent Atrial Fibrillation - She is s/p DCCV in 07/2021. She maintained normal sinus rhythm during recent admission and is in normal sinus rhythm by examination today with no reported palpitations. Continue Toprol-XL 50 mg daily along with Eliquis 5 mg twice  daily for anticoagulation. Will recheck CBC and BMET today given her anemia at the time of hospital discharge.   5. HLD - LDL was at 135 in 11/2021. She reports she was compliant with Zetia 10 mg daily and Crestor 5 mg daily prior to admission. Reviewed options with the patient and will try increasing Crestor to 10 mg daily. Would plan for a repeat FLP and LFT's in 2 to 3 months. If unable to tolerate the higher dose or LDL not at goal, could further titrate Crestor or plan to initiate PCSK9 inhibitor therapy.   Medication Adjustments/Labs and Tests Ordered: Current medicines are reviewed at length with the patient today.  Concerns regarding medicines are outlined above.  Medication changes, Labs and Tests ordered today are listed in the Patient Instructions below. Patient Instructions  Medication Instructions:  STOP Amlodipine  INCREASE Crestor to 10 mg tablets daily  Labwork: CBC BMET  Follow-Up: Follow up with Dr. Harl Bowie or Bernerd Pho, PA-C in 3 months  Any Other Special Instructions Will Be Listed Below (If Applicable).     If you need a refill on your cardiac medications before your next appointment, please call your pharmacy.    Signed, Erma Heritage, PA-C  11/26/2021 5:11 PM    [facility] [address] [place], [place] [address] Phone: [phone] Fax: [phone]

## 2021-11-26 ENCOUNTER — Other Ambulatory Visit: Payer: Self-pay

## 2021-11-26 ENCOUNTER — Ambulatory Visit (INDEPENDENT_AMBULATORY_CARE_PROVIDER_SITE_OTHER): Payer: Medicare Other | Admitting: Student

## 2021-11-26 ENCOUNTER — Encounter: Payer: Self-pay | Admitting: Student

## 2021-11-26 ENCOUNTER — Other Ambulatory Visit (HOSPITAL_COMMUNITY)
Admission: RE | Admit: 2021-11-26 | Discharge: 2021-11-26 | Disposition: A | Discharge status: Home / Self Care | Payer: Medicare Other | Source: Ambulatory Visit | Attending: Student | Admitting: Student

## 2021-11-26 VITALS — BP 102/54 | HR 68 | Ht 63.0 in | Wt 189.2 lb

## 2021-11-26 DIAGNOSIS — I255 Ischemic cardiomyopathy: Secondary | ICD-10-CM

## 2021-11-26 DIAGNOSIS — I493 Ventricular premature depolarization: Secondary | ICD-10-CM | POA: Diagnosis not present

## 2021-11-26 DIAGNOSIS — Z79899 Other long term (current) drug therapy: Secondary | ICD-10-CM | POA: Insufficient documentation

## 2021-11-26 DIAGNOSIS — I4819 Other persistent atrial fibrillation: Secondary | ICD-10-CM

## 2021-11-26 DIAGNOSIS — I2510 Atherosclerotic heart disease of native coronary artery without angina pectoris: Secondary | ICD-10-CM | POA: Diagnosis not present

## 2021-11-26 DIAGNOSIS — E785 Hyperlipidemia, unspecified: Secondary | ICD-10-CM

## 2021-11-26 LAB — CBC
HCT: 32 % — ABNORMAL LOW (ref 36.0–46.0)
Hemoglobin: 10.3 g/dL — ABNORMAL LOW (ref 12.0–15.0)
MCH: 29.2 pg (ref 26.0–34.0)
MCHC: 32.2 g/dL (ref 30.0–36.0)
MCV: 90.7 fL (ref 80.0–100.0)
Platelets: 408 10*3/uL — ABNORMAL HIGH (ref 150–400)
RBC: 3.53 MIL/uL — ABNORMAL LOW (ref 3.87–5.11)
RDW: 13.2 % (ref 11.5–15.5)
WBC: 7.5 10*3/uL (ref 4.0–10.5)
nRBC: 0 % (ref 0.0–0.2)

## 2021-11-26 LAB — BASIC METABOLIC PANEL
Anion gap: 10 (ref 5–15)
BUN: 20 mg/dL (ref 8–23)
CO2: 21 mmol/L — ABNORMAL LOW (ref 22–32)
Calcium: 8.8 mg/dL — ABNORMAL LOW (ref 8.9–10.3)
Chloride: 98 mmol/L (ref 98–111)
Creatinine, Ser: 1.05 mg/dL — ABNORMAL HIGH (ref 0.44–1.00)
GFR, Estimated: 59 mL/min — ABNORMAL LOW (ref >60)
Glucose, Bld: 119 mg/dL — ABNORMAL HIGH (ref 70–99)
Potassium: 3.9 mmol/L (ref 3.5–5.1)
Sodium: 129 mmol/L — ABNORMAL LOW (ref 135–145)

## 2021-11-26 MED ORDER — SACUBITRIL-VALSARTAN 24-26 MG PO TABS: 1.0000 | ORAL_TABLET | Freq: Two times a day (BID) | ORAL | 11 refills | Status: AC

## 2021-11-26 MED ORDER — ROSUVASTATIN CALCIUM 10 MG PO TABS
10.0000 mg | ORAL_TABLET | Freq: Every day | ORAL | 3 refills | Status: DC
Start: 2021-11-26 — End: 2022-08-24

## 2021-11-26 NOTE — Patient Instructions (Signed)
Medication Instructions:  STOP Amlodipine  INCREASE Crestor to 10 mg tablets daily  Labwork: CBC BMET  Follow-Up: Follow up with Dr. Wyline Mood or Randall An, PA-C in 3 months  Any Other Special Instructions Will Be Listed Below (If Applicable).     If you need a refill on your cardiac medications before your next appointment, please call your pharmacy.

## 2021-11-27 ENCOUNTER — Telehealth: Payer: Self-pay

## 2021-11-27 DIAGNOSIS — Z79899 Other long term (current) drug therapy: Secondary | ICD-10-CM

## 2021-11-27 NOTE — Telephone Encounter (Signed)
-----   Message from Erma Heritage, [place] sent at 11/26/2021  4:52 PM EST ----- Please let the patient know her hemoglobin has significantly improved as this was down to 7.9 at the time of hospital discharge and has improved to 10.3. Her sodium level is slightly low and creatinine is elevated.  I am concerned she is dehydrated and she is listed as taking Lasix 80 mg as needed but mentioned during her office visit she has not taken this. I would confirm with the patient that she is not utilizing this. Fluid amount per day should be approximately 2 Liters. Recheck BMET with [place] or PCP in 2-3 weeks.

## 2021-11-27 NOTE — Telephone Encounter (Signed)
Patient confirmed that she is not taking lasix. She will increase fluid intake to 2 liters per day and repeat bmet in 2-3 weeks. I have copied pcp lab results.

## 2021-12-04 ENCOUNTER — Encounter: Payer: Self-pay | Admitting: Orthopaedic Surgery

## 2021-12-04 ENCOUNTER — Ambulatory Visit (INDEPENDENT_AMBULATORY_CARE_PROVIDER_SITE_OTHER): Payer: Medicare Other | Admitting: Orthopaedic Surgery

## 2021-12-04 ENCOUNTER — Other Ambulatory Visit: Payer: Self-pay

## 2021-12-04 DIAGNOSIS — M1712 Unilateral primary osteoarthritis, left knee: Secondary | ICD-10-CM | POA: Diagnosis not present

## 2021-12-04 DIAGNOSIS — I255 Ischemic cardiomyopathy: Secondary | ICD-10-CM | POA: Diagnosis not present

## 2021-12-04 MED ORDER — METHYLPREDNISOLONE ACETATE 40 MG/ML IJ SUSP
40.0000 mg | INTRAMUSCULAR | Status: AC | PRN
  Administered 2021-12-04: 40 mg via INTRA_ARTICULAR

## 2021-12-04 MED ORDER — BUPIVACAINE HCL 0.5 % IJ SOLN
3.0000 mL | INTRAMUSCULAR | Status: AC | PRN
  Administered 2021-12-04: 3 mL via INTRA_ARTICULAR

## 2021-12-04 MED ORDER — LIDOCAINE HCL 1 % IJ SOLN
0.5000 mL | INTRAMUSCULAR | Status: AC | PRN
  Administered 2021-12-04: .5 mL
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
## 2021-12-11 ENCOUNTER — Encounter: Payer: Self-pay | Admitting: *Deleted

## 2022-01-01 ENCOUNTER — Ambulatory Visit (INDEPENDENT_AMBULATORY_CARE_PROVIDER_SITE_OTHER): Payer: Medicare Other | Admitting: Orthopaedic Surgery

## 2022-01-01 ENCOUNTER — Telehealth: Payer: Self-pay | Admitting: Radiology

## 2022-01-01 ENCOUNTER — Encounter: Payer: Self-pay | Admitting: Orthopaedic Surgery

## 2022-01-01 VITALS — Ht 63.0 in | Wt 185.0 lb

## 2022-01-01 DIAGNOSIS — Z5321 Procedure and treatment not carried out due to patient leaving prior to being seen by health care provider: Secondary | ICD-10-CM

## 2022-01-01 NOTE — Telephone Encounter (Signed)
Patient came in to [facility] office for scheduled return visit in regards to left knee. After being roomed, she went back to the front desk and explained to [place] that she was having chest pain and needed to leave and go to [facility] ED. Claris Gower asked patient if she would like for [place] to check her out or call EMS and she denied. I went out to patient's truck and asked about her symptoms and if I could call EMS. She stated that she did not want EMS called because they would take her to [facility] and she needed to get to [facility]. Patient left facility headed to hospital. ?

## 2022-01-04 NOTE — Progress Notes (Signed)
Pt not seen.

## 2022-01-07 ENCOUNTER — Encounter (HOSPITAL_COMMUNITY): Payer: Medicare Other

## 2022-01-29 ENCOUNTER — Ambulatory Visit (INDEPENDENT_AMBULATORY_CARE_PROVIDER_SITE_OTHER): Payer: Medicare Other | Admitting: Orthopaedic Surgery

## 2022-01-29 ENCOUNTER — Encounter: Payer: Self-pay | Admitting: Orthopaedic Surgery

## 2022-01-29 DIAGNOSIS — I255 Ischemic cardiomyopathy: Secondary | ICD-10-CM

## 2022-01-29 DIAGNOSIS — M48061 Spinal stenosis, lumbar region without neurogenic claudication: Secondary | ICD-10-CM | POA: Diagnosis not present

## 2022-01-29 NOTE — Progress Notes (Signed)
? ?Office Visit Note ?  ?Patient: [name]           ?Date of Birth: 03-02-1956           ?MRN: [id number] ?Visit Date: 01/29/2022 ?             ?Requested by: Neale Burly, MD ?[place] ?[place],  [place] [address] ?PCP: Neale Burly, MD ? ? ?Assessment & Plan: ?Visit Diagnoses:  ?1. Stenosis of lateral recess of lumbar spine   ? ? ?Plan: We will set patient up for single epidural injection for lateral recess stenosis with left greater than right symptoms with lateral recess stenosis at L3-4 and L4-5.  She can follow-up with me in 2 months. ? ?Follow-Up Instructions: Return in about 2 months (around 03/31/2022).  ? ?Orders:  ?No orders of the defined types were placed in this encounter. ? ?No orders of the defined types were placed in this encounter. ? ? ? ? Procedures: ?No procedures performed ? ? ?Clinical Data: ?No additional findings. ? ? ?Subjective: ?Chief Complaint  ?Patient presents with  ? Left Knee - Pain  ? ? ?HPI 66 year old female post right knee ACL reconstruction 2002, history of CABG procedure ischemic cardiomyopathy some carotid narrowing stenosis history of atrial fibs returns with ongoing problems with significant back pain.  Patient had previous MRI scan showed lateral recess narrowing L3-4 L4-5.  Recent MRI scan is reviewed with patient which again shows some progression of lateral recess stenosis L3-4 L4-5.  Patient's had more symptoms on the left than right. ? ?Review of Systems all the systems noncontributory to HPI. ? ? ?Objective: ?Vital Signs: There were no vitals taken for this visit. ? ?Physical Exam ?Constitutional:   ?   Appearance: She is well-developed.  ?HENT:  ?   Head: Normocephalic.  ?   Right Ear: External ear normal.  ?   Left Ear: External ear normal. There is no impacted cerumen.  ?Eyes:  ?   Pupils: Pupils are equal, round, and reactive to light.  ?Neck:  ?   Thyroid: No thyromegaly.  ?   Trachea: No tracheal deviation.  ?Cardiovascular:  ?   Rate and Rhythm:  Normal rate.  ?Pulmonary:  ?   Effort: Pulmonary effort is normal.  ?Abdominal:  ?   Palpations: Abdomen is soft.  ?Musculoskeletal:  ?   Cervical back: No rigidity.  ?Skin: ?   General: Skin is warm and dry.  ?Neurological:  ?   Mental Status: She is alert and oriented to person, place, and time.  ?Psychiatric:     ?   Behavior: Behavior normal.  ? ? ?Ortho Exam some discomfort straight leg raising 90 degrees.  Sciatic notch tenderness both right and left. ? ?Specialty Comments:  ?No specialty comments available. ? ?Imaging: ?No results found. ? ? ?PMFS History: ?Patient Active Problem List  ? Diagnosis Date Noted  ? Stenosis of lateral recess of lumbar spine 01/31/2022  ? Arthritis of left knee 12/04/2021  ? NSTEMI (non-ST elevated myocardial infarction) ([place]) 11/05/2021  ? Persistent atrial fibrillation ([place]) with DCCV 07/30/21 maintaining SR 08/15/2021  ? Anticoagulation adequate 08/15/2021  ? Shock ([place]) hypovolemic 08/13/2021  ? Chronic systolic heart failure ([place]) 08/13/2021  ? Ventricular bigeminy 08/12/2021  ? Patient left after triage 09/12/2020  ? Anxiety 09/12/2020  ? Insomnia 09/12/2020  ? Hypothyroidism 07/03/2020  ? Ischemic cardiomyopathy 07/03/2020  ? Erroneous encounter - disregard 09/17/2015  ? CAD S/P multiple PCI after CABG  06/04/2015  ? Obesity-BMI 41 06/04/2015  ? Unstable angina ([place]) 06/03/2015  ? Angina, class III - progressed despite medical therapy 10/01/2013  ? LLQ abdominal pain 12/02/2011  ? Epigastric pain 04/19/2011  ? Anemia 04/19/2011  ? Diverticulitis 01/15/2011  ? DIARRHEA, CHRONIC 07/14/2010  ? Hx of CABG 2005 01/23/2010  ? Headache 01/23/2010  ? Hyperlipidemia with target LDL less than 70 05/27/2009  ? Essential hypertension 05/27/2009  ? PVD- 50-69% bilat ICA  05/27/2009  ? Chronic obstructive pulmonary disease (COPD) ([place]) 05/27/2009  ? GERD 05/27/2009  ? Seizure ([place]) 05/27/2009  ? ?Past Medical History:  ?Diagnosis Date  ? Acute renal failure ([place])   ? Anemia   ?  Anticoagulation adequate 08/15/2021  ? Anxiety   ? Arthritis   ? Asthma   ? Carotid artery disease ([place])   ? a. Duplex 50-69% BICA 03/2014.  ? Chronic back pain   ? Collagen vascular disease ([place])   ? COPD (chronic obstructive pulmonary disease) ([place])   ? Coronary artery disease   ? a. s/p prior CABG 2005. b. BMS 02/2009 to VG-PDA. c. NSTEMI 08/2010 s/p PTCA/DES to SVG-PDA in previously stented area. d. DES to SVG to RCA on 05/24/14  e. LHC 06/04/2015 w/ no sig change in her anatomy and Rx managment f. cath in 11/2021 with DES to LM  ? DDD (degenerative disc disease)   ? Depression   ? Diverticulitis   ? 4 documented episodes by CT; most recent April 2012  ? DIVERTICULITIS OF COLON 07/14/2010  ? Qualifier: Diagnosis of  By: Westly Pam.   ? GERD (gastroesophageal reflux disease)   ? History of blood transfusion   ? HTN (hypertension)   ? Hyperlipidemia   ? Hypertension   ? Hypothyroidism   ? Ischemic cardiomyopathy   ? a. EF 40% in 2012 by cath. b. improved to 50-55% by cath 05/2014  c. EF 35-40% by [facility] 05/2015  ? Kidney stones   ? Persistent atrial fibrillation ([place]) with DCCV 07/30/21 maintaining SR 08/15/2021  ? Seizures ([place])   ? Shock ([place]) hypovolemic 08/13/2021  ? Sinus bradycardia   ? Sleep apnea   ? a. Mild-to-moderate obstructive sleep apnea diagnosed in April 2012 by sleep study.  ? Tension headache   ?  ?Family History  ?Problem Relation Age of Onset  ? Heart attack Mother   ? Heart disease Mother   ? Breast cancer Mother   ? Heart attack Father   ? Heart disease Father   ? Colon cancer Neg Hx   ?  ?Past Surgical History:  ?Procedure Laterality Date  ? APPENDECTOMY  ~ 1970  ? CARDIAC CATHETERIZATION  Jan 2012  ? patent stents in RCA graft; CAD stable  ? CARDIAC CATHETERIZATION N/A 06/03/2015  ? Procedure: Left Heart Cath and Cors/Grafts Angiography;  Surgeon: Belva Crome, MD;  Location: [place] CV LAB;  Service: Cardiovascular;  Laterality: N/A;  ? CARPAL TUNNEL WITH CUBITAL TUNNEL Right 2001  ?  CORONARY ANGIOPLASTY WITH STENT PLACEMENT  2011  ? Promus Premier DES, 3.5 mm x 12 mm, for 99% proximal stent ISR in the SVG-RPDA   ? CORONARY ANGIOPLASTY WITH STENT PLACEMENT  05/24/2014  ? CORONARY ARTERY BYPASS GRAFT  2005  ? LIMA to AL, SVG to LAD, SVG to Cx, SVG-PDA  ? CORONARY STENT INTERVENTION N/A 11/05/2021  ? Procedure: CORONARY STENT INTERVENTION;  Surgeon: Jettie Booze, MD;  Location: [place] CV LAB;  Service: Cardiovascular;  Laterality: N/A;  ? INTRAVASCULAR ULTRASOUND/IVUS N/A 11/05/2021  ? Procedure: Intravascular Ultrasound/IVUS;  Surgeon: Jettie Booze, MD;  Location: [facility] CV LAB;  Service: Cardiovascular;  Laterality: N/A;  ? KNEE ARTHROSCOPY W/ ACL RECONSTRUCTION Left 2001  ? LAPAROSCOPIC CHOLECYSTECTOMY  2010  ? LEFT HEART CATH AND CORS/GRAFTS ANGIOGRAPHY N/A 11/05/2021  ? Procedure: LEFT HEART CATH AND CORS/GRAFTS ANGIOGRAPHY;  Surgeon: Jettie Booze, MD;  Location: [place] CV LAB;  Service: Cardiovascular;  Laterality: N/A;  ? LEFT HEART CATHETERIZATION WITH CORONARY/GRAFT ANGIOGRAM N/A 05/24/2014  ? Procedure: LEFT HEART CATHETERIZATION WITH Beatrix Fetters;  Surgeon: Leonie Man, MD; EF 50-55%, LAD & D1 100%, LIMA-D1 OK, CFX OK, OM1 & OM2 100%, lat OM branch 100%, RCA 100%; pSVG-RPDA 99% rx w/ scoring balloon PTCA & 3.5 x 12 mm Promus DES; SVG-LAD, SVG-OM known occluded      ? TUBAL LIGATION  ~ 1989  ? VAGINAL HYSTERECTOMY  ~ 1993  ? ?Social History  ? ?Occupational History  ? Occupation: disabled  ?Tobacco Use  ? Smoking status: Former  ?  Packs/day: 0.50  ?  Years: 21.00  ?  Pack years: 10.50  ?  Types: Cigarettes  ?  Start date: 03/19/1993  ?  Quit date: 07/29/2015  ?  Years since quitting: 6.5  ? Smokeless tobacco: Never  ? Tobacco comments:  ?  smokes some time/go weeks without smoking  ?Vaping Use  ? Vaping Use: Never used  ?Substance and Sexual Activity  ? Alcohol use: No  ?  Alcohol/week: 0.0 standard drinks  ? Drug use: Not Currently  ?   Types: Cocaine, Marijuana  ?  Comment: last month  ? Sexual activity: Never  ? ? ? ? ? ? ?

## 2022-01-31 DIAGNOSIS — M48061 Spinal stenosis, lumbar region without neurogenic claudication: Secondary | ICD-10-CM | POA: Insufficient documentation

## 2022-02-05 ENCOUNTER — Other Ambulatory Visit: Payer: Self-pay

## 2022-02-11 NOTE — Progress Notes (Deleted)
Cardiology Office Note    Date:  02/11/2022   ID:  NYKAYLA [name], DOB [birth year], MRN [id number]  PCP:  Neale Burly, MD  Cardiologist: Carlyle Dolly, MD    No chief complaint on file.   History of Present Illness:    [name] is a 66 y.o. female with past medical history of CAD (s/p CABG in 2005 with multiple interventions since, catheterization in 05/2015 showing occluded SVG-LAD and SVG-LCx with patent LIMA-D1 and patent SVG-RCA with no anatomy amenable to PCI, no significant ischemia by NST in 06/2018, NSTEMI in 11/2021 with DES along LM extending into the LCx), HFmrEF (EF 35-40% by cath in 2016, at 55-60% in 06/2018, 40-45% in 07/2021 and 45-50% in 08/2021), PVC's (on Mexiletine), persistent atrial fibrillation (s/p DCCV in 07/2021) HTN, HLD, carotid artery stenosis and COPD who presents to the office today for 20-month follow-up.   She was last examined myself in 11/2021 following a recent admission for NSTEMI during what she received a DES to the left main extending into the LCx. At the time of her visit, she denied any recurrent chest pain resembling her prior angina but did report dyspnea on exertion and fatigue been hypotensive at times with SBP in the 90's at home and her BP was at 102/54 during her office visit. Therefore, Amlodipine was discontinued and she was continued on Toprol-XL, Entresto, Plavix, Zetia, Crestor and Eliquis.  - Any symptoms since titration of Crestor? - FLP, CMET   Past Medical History:  Diagnosis Date   Acute renal failure (HCC)    Anemia    Anticoagulation adequate 08/15/2021   Anxiety    Arthritis    Asthma    Carotid artery disease ([place])    a. Duplex 50-69% BICA 03/2014.   Chronic back pain    Collagen vascular disease (HCC)    COPD (chronic obstructive pulmonary disease) (HCC)    Coronary artery disease    a. s/p prior CABG 2005. b. BMS 02/2009 to VG-PDA. c. NSTEMI 08/2010 s/p PTCA/DES to SVG-PDA in previously stented area. d.  DES to SVG to RCA on 05/24/14  e. LHC 06/04/2015 w/ no sig change in her anatomy and Rx managment f. cath in 11/2021 with DES to LM   DDD (degenerative disc disease)    Depression    Diverticulitis    4 documented episodes by CT; most recent April 2012   DIVERTICULITIS OF COLON 07/14/2010   Qualifier: Diagnosis of  By: Westly Pam.    GERD (gastroesophageal reflux disease)    History of blood transfusion    HTN (hypertension)    Hyperlipidemia    Hypertension    Hypothyroidism    Ischemic cardiomyopathy    a. EF 40% in 2012 by cath. b. improved to 50-55% by cath 05/2014  c. EF 35-40% by [facility] 05/2015   Kidney stones    Persistent atrial fibrillation ([place]) with DCCV 07/30/21 maintaining SR 08/15/2021   Seizures ([place])    Shock ([place]) hypovolemic 08/13/2021   Sinus bradycardia    Sleep apnea    a. Mild-to-moderate obstructive sleep apnea diagnosed in April 2012 by sleep study.   Tension headache     Past Surgical History:  Procedure Laterality Date   APPENDECTOMY  ~ [place]  Jan 2012   patent stents in RCA graft; CAD stable   CARDIAC CATHETERIZATION N/A 06/03/2015   Procedure: Left Heart Cath and Cors/Grafts Angiography;  Surgeon: Belva Crome, MD;  Location: [place] [facility];  Service: Cardiovascular;  Laterality: N/A;   CARPAL TUNNEL WITH CUBITAL TUNNEL Right 2001   CORONARY ANGIOPLASTY WITH STENT PLACEMENT  2011   Promus Premier DES, 3.5 mm x 12 mm, for 99% proximal stent ISR in the SVG-RPDA    CORONARY ANGIOPLASTY WITH STENT PLACEMENT  05/24/2014   CORONARY ARTERY BYPASS GRAFT  2005   LIMA to AL, SVG to LAD, SVG to Cx, SVG-PDA   CORONARY STENT INTERVENTION N/A 11/05/2021   Procedure: CORONARY STENT INTERVENTION;  Surgeon: Jettie Booze, MD;  Location: [facility];  Service: Cardiovascular;  Laterality: N/A;   INTRAVASCULAR ULTRASOUND/IVUS N/A 11/05/2021   Procedure: Intravascular Ultrasound/IVUS;  Surgeon: Jettie Booze, MD;   Location: [facility];  Service: Cardiovascular;  Laterality: N/A;   KNEE ARTHROSCOPY W/ ACL RECONSTRUCTION Left 2001   LAPAROSCOPIC CHOLECYSTECTOMY  2010   LEFT HEART CATH AND CORS/GRAFTS ANGIOGRAPHY N/A 11/05/2021   Procedure: LEFT HEART CATH AND CORS/GRAFTS ANGIOGRAPHY;  Surgeon: Jettie Booze, MD;  Location: [facility];  Service: Cardiovascular;  Laterality: N/A;   LEFT HEART CATHETERIZATION WITH CORONARY/GRAFT ANGIOGRAM N/A 05/24/2014   Procedure: LEFT HEART CATHETERIZATION WITH Beatrix Fetters;  Surgeon: Leonie Man, MD; EF 50-55%, LAD & D1 100%, LIMA-D1 OK, CFX OK, OM1 & OM2 100%, lat OM branch 100%, RCA 100%; pSVG-RPDA 99% rx w/ scoring balloon PTCA & 3.5 x 12 mm Promus DES; SVG-LAD, SVG-OM known occluded       TUBAL LIGATION  ~ [place]  ~ 1993    Current Medications: Outpatient Medications Prior to Visit  Medication Sig Dispense Refill   acetaminophen (TYLENOL) 650 MG CR tablet Take 1,300 mg by mouth every 8 (eight) hours as needed for pain.     albuterol (VENTOLIN HFA) 108 (90 Base) MCG/ACT inhaler Inhale 2 puffs into the lungs every 6 (six) hours as needed for wheezing or shortness of breath.     ALPRAZolam (XANAX) 1 MG tablet Take 1 mg by mouth at bedtime.     apixaban (ELIQUIS) 5 MG TABS tablet Take 1 tablet (5 mg total) by mouth 2 (two) times daily. Restart on 11/10/21 in the evening. 60 tablet 6   cetirizine (ZYRTEC ALLERGY) 10 MG tablet Take 1 tablet (10 mg total) by mouth daily. 30 tablet 0   clopidogrel (PLAVIX) 75 MG tablet Take 1 tablet (75 mg total) by mouth daily with breakfast. 90 tablet 3   ezetimibe (ZETIA) 10 MG tablet TAKE (1) TABLET BY MOUTH ONCE DAILY. 90 tablet 0   FLUoxetine (PROZAC) 20 MG capsule Take 20 mg by mouth daily.     furosemide (LASIX) 80 MG tablet Take 1 tablet (80 mg total) by mouth daily as needed for edema (we changed to if needed because you were dehydrated). 90 tablet 3   hydrOXYzine  (ATARAX/VISTARIL) 25 MG tablet Take 1 tablet (25 mg total) by mouth every 6 (six) hours as needed for anxiety. 30 tablet 0   levETIRAcetam (KEPPRA) 250 MG tablet TAKE (1) TABLET BY MOUTH TWICE DAILY. 180 tablet 0   levothyroxine (SYNTHROID) 75 MCG tablet Take 1 tablet (75 mcg total) by mouth daily before breakfast. 90 tablet 3   metoprolol succinate (TOPROL-XL) 50 MG 24 hr tablet TAKE (1) TABLET BY MOUTH ONCE DAILY. TAKE WITH OR IMMEDIATELY FOLLOWING A MEAL. 90 tablet 1   mexiletine (MEXITIL) 150 MG capsule Take 1 capsule (150 mg total) by mouth every 8 (eight) hours.  90 capsule 6   Multiple Vitamin (MULTIVITAMIN WITH MINERALS) TABS tablet Take 1 tablet by mouth 2 (two) times daily. (Patient taking differently: Take 1 tablet by mouth daily.) 25 tablet 4   nitroGLYCERIN (NITROSTAT) 0.4 MG SL tablet DISSOLVE 1 TABLET UNDER TONGUE EVERY 5 MINUTES UP TO 15 MIN FOR CHESTPAIN. IF NO RELIEF CALL 911. 25 tablet 3   Omega 3 1000 MG CAPS Take 1 capsule (1,000 mg total) by mouth 2 (two) times daily.     pantoprazole (PROTONIX) 40 MG tablet Take 1 tablet (40 mg total) by mouth daily. 90 tablet 3   potassium chloride SA (KLOR-CON) 20 MEQ tablet Take 1 tablet (20 mEq total) by mouth daily as needed. 90 tablet 3   ranolazine (RANEXA) 500 MG 12 hr tablet Take 500 mg by mouth 2 (two) times daily.     rosuvastatin (CRESTOR) 10 MG tablet Take 1 tablet (10 mg total) by mouth daily. 90 tablet 3   sacubitril-valsartan (ENTRESTO) 24-26 MG Take 1 tablet by mouth 2 (two) times daily. 60 tablet 11   No facility-administered medications prior to visit.     Allergies:   Pneumococcal vaccines, Adhesive [tape], Crestor [rosuvastatin], Lipitor [atorvastatin], and Morphine and related   Social History   Socioeconomic History   Marital status: Legally Separated    Spouse name: Not on file   Number of children: 3   Years of education: Not on file   Highest education level: Not on file  Occupational History   Occupation:  disabled  Tobacco Use   Smoking status: Former    Packs/day: 0.50    Years: 21.00    Pack years: 10.50    Types: Cigarettes    Start date: 03/19/1993    Quit date: 07/29/2015    Years since quitting: 6.5   Smokeless tobacco: Never   Tobacco comments:    smokes some time/go weeks without smoking  Vaping Use   Vaping Use: Never used  Substance and Sexual Activity   Alcohol use: No    Alcohol/week: 0.0 standard drinks   Drug use: Not Currently    Types: Cocaine, Marijuana    Comment: last month   Sexual activity: Never  Other Topics Concern   Not on file  Social History Narrative   Lives in [place], [place].    Social Determinants of Health   Financial Resource Strain: Not on file  Food Insecurity: Not on file  Transportation Needs: Not on file  Physical Activity: Not on file  Stress: Not on file  Social Connections: Not on file     Family History:  The patient's family history includes Breast cancer in her mother; Heart attack in her father and mother; Heart disease in her father and mother.   Review of Systems:    Please see the history of present illness.     All other systems reviewed and are otherwise negative except as noted above.   Physical Exam:    VS:  There were no vitals taken for this visit.   General: Well developed, well nourished,female appearing in no acute distress. Head: Normocephalic, atraumatic. Neck: No carotid bruits. JVD not elevated.  Lungs: Respirations regular and unlabored, without wheezes or rales.  Heart: ***Regular rate and rhythm. No S3 or S4.  No murmur, no rubs, or gallops appreciated. Abdomen: Appears non-distended. No obvious abdominal masses. Msk:  Strength and tone appear normal for age. No obvious joint deformities or effusions. Extremities: No clubbing or cyanosis. No edema.  Distal  pedal pulses are 2+ bilaterally. Neuro: Alert and oriented X 3. Moves all extremities spontaneously. No focal deficits noted. Psych:  Responds to  questions appropriately with a normal affect. Skin: No rashes or lesions noted  Wt Readings from Last 3 Encounters:  01/01/22 185 lb (83.9 kg)  12/04/21 190 lb (86.2 kg)  11/26/21 189 lb 3.2 oz (85.8 kg)      Studies/Labs Reviewed:   EKG:  EKG is*** ordered today.  The ekg ordered today demonstrates ***  Recent Labs: 08/12/2021: TSH 2.344 11/05/2021: ALT 17; B Natriuretic Peptide 547.0 11/07/2021: Magnesium 1.9 11/26/2021: BUN 20; Creatinine, Ser 1.05; Hemoglobin 10.3; Platelets 408; Potassium 3.9; Sodium 129   Lipid Panel    Component Value Date/Time   CHOL 229 (H) 11/06/2021 0127   TRIG 152 (H) 11/06/2021 0127   HDL 64 11/06/2021 0127   CHOLHDL 3.6 11/06/2021 0127   VLDL 30 11/06/2021 0127   LDLCALC 135 (H) 11/06/2021 0127    Additional studies/ records that were reviewed today include:   Echocardiogram: 08/2021 IMPRESSIONS     1. Left ventricular ejection fraction, by estimation, is 45 to 50%. The  left ventricle has mildly decreased function. The left ventricle  demonstrates global hypokinesis. The left ventricular internal cavity size  was mildly dilated. Left ventricular  diastolic parameters are consistent with Grade II diastolic dysfunction  (pseudonormalization).   2. Right ventricular systolic function is normal. The right ventricular  size is normal. Tricuspid regurgitation signal is inadequate for assessing  PA pressure.   3. Left atrial size was moderately dilated.   4. The mitral valve is normal in structure. Trivial mitral valve  regurgitation. No evidence of mitral stenosis.   5. The aortic valve is tricuspid. Aortic valve regurgitation is not  visualized. Mild aortic valve sclerosis is present, with no evidence of  aortic valve stenosis.   6. Aortic dilatation noted. There is mild dilatation of the ascending  aorta, measuring 38 mm.   7. The inferior vena cava is normal in size with greater than 50%  respiratory variability, suggesting right atrial  pressure of 3 mmHg.   LHC: 11/2021 Mid LAD lesion is 100% stenosed.  SVG to LAD is occluded.  The distal LAD appears to fill by LIMA and retrograde flow from the diagonal.   Prox RCA lesion is 100% stenosed.  SVG to distal RCA is patent.  Mild in-stent restenosis in the RCA stents.   1st Diag lesion is 99% stenosed.  There appear to be left to left collaterals from the distal LAD.   2nd Mrg lesion is 100% stenosed.  There are left to left collaterals filling this vessel.   Mid LM to Ost LAD lesion is 80% stenosed which extended into the proximal circumflex.  Significant calcium by IVUS.   A drug-eluting stent was successfully placed from the proximal circumflex to the proximal left main using a SYNERGY XD [id number], postdilated to greater than 4.5 mm in the left main.   Post intervention, there is a 0% residual stenosis.   LV end diastolic pressure is normal.   There is no aortic valve stenosis.   Complex lesion in the left main extending into the circumflex.  Successful stent placement after Cutting Balloon angioplasty.  Significant tortuosity in the mid circumflex made wiring the vessel difficult.   She will need aggressive secondary prevention.  Plavix was added.  She is mildly anemic.  If no significant bleeding issues, she can restart Eliquis tomorrow.  She had been off  of Eliquis less than 24 hours prior to this procedure.  Given her anemia, will likely hold aspirin.   Aggressive hydration post cath.    Assessment:    No diagnosis found.   Plan:   In order of problems listed above:  1. CAD - She is s/p CABG in 2005 with multiple interventions since with most recent intervention being an NSTEMI in 11/2021 with DES along LM extending into the LCx. - ***  2. HFmrEF  - Her EF was at 45-50% in 08/2021. *** - Remains on Toprol-XL 50 mg daily and Entresto 24-26 mg twice daily.  3. PVC's - She does have frequent PVC's and was previously started on Mexiletine 150 mg 3 times daily by  EP. She is also on Toprol-XL 50 mg daily.  4. Persistent Atrial Fibrillation - She underwent DCCV in 07/2021 and has maintained normal sinus rhythm since. - Continue Toprol-XL 50 mg daily for rate control along with Eliquis 5 mg twice daily for anticoagulation.  5. HLD - Her LDL was at 135 in 11/2021 and she had been compliant with Zetia 10 mg daily and Crestor 5 mg daily.  She was previously intolerant to higher intensity statin therapy and Crestor was titrated to 10 mg daily at the time of her office visit in 11/2021.   Medication Adjustments/Labs and Tests Ordered: Current medicines are reviewed at length with the patient today.  Concerns regarding medicines are outlined above.  Medication changes, Labs and Tests ordered today are listed in the Patient Instructions below. There are no Patient Instructions on file for this visit.   Signed, Erma Heritage, PA-C  02/11/2022 11:03 AM    [facility] [address] [place], [place] [address] Phone: [phone] Fax: [phone]

## 2022-02-12 ENCOUNTER — Ambulatory Visit: Payer: Medicaid Other | Admitting: Student

## 2022-03-13 ENCOUNTER — Other Ambulatory Visit: Payer: Self-pay | Admitting: Cardiology

## 2022-03-13 DIAGNOSIS — I4819 Other persistent atrial fibrillation: Secondary | ICD-10-CM

## 2022-03-13 NOTE — Telephone Encounter (Signed)
Eliquis 5mg  refill request received. Patient is 66 years old, weight-83.9kg, Crea-1.05 on 11/26/2021, Diagnosis-Afib, and last seen by [place] on 11/26/21. Dose is appropriate based on dosing criteria. Will send in refill to requested pharmacy.

## 2022-03-19 ENCOUNTER — Ambulatory Visit: Payer: Medicare Other

## 2022-03-19 NOTE — Progress Notes (Unsigned)
Patient ID: [name]                 DOB: [birth year]                    MRN: [id number]     HPI: [name] is a 66 y.o. female patient referred to lipid clinic by Dr. Wyline MoodBranch. PMH is significant for CAD (s/p CABG in 2005 with multiple interventions since, catheterization in 05/2015 showing occluded SVG-LAD and SVG-LCx with patent LIMA-D1 and patent SVG-RCA with no anatomy amenable to PCI, no significant ischemia by NST in 06/2018), HFrEF (EF 35-40% by cath in 2016, at 55-60% in 06/2018, 40-45% in 07/2021 and 45-50% in 08/2021), PVC's (on Mexiletine), persistent atrial fibrillation (s/p DCCV in 07/2021) HTN, HLD, carotid artery stenosis and COPD. Pt presented to [facility] ED on 11/26/21 with chest pain, found to have NSTEMI and had stent placement to Lcx. Pt reported to the [place] office on 01/01/22, but left in order to visit the [facility], however no documentation of an ED visit is found. At last visit on 11/26/21 with Randall AnBrittany Strader, rosuvastatin was increased to 10 mg daily. Pt has history of intolerance to rosuvastatin 10 mg lovastatin 40 mg and simvastatin 40 mg due to cramps. Pt has tolerated rosuvastatin 5 mg since reducing the dose in 2015.  Assess adherence PCSK9 is best option > unsure of Rx insurance - medicare/medicaid   Current Medications: rosuvastatin 10 mg daily and ezetimibe 10 mg daily Intolerances: Lovastatin 40 mg daily, Lovaza 2 g twice daily, pravastatin 20 and 40 mg daily, and simvastatin 40 mg daily Risk Factors: progressive CAD LDL goal: <55 mg/dL  Diet:   Exercise:   Family History: The patient's family history includes Breast cancer in her mother; Heart attack in her father and mother; Heart disease in her father and mother  Social History:  Social History   Socioeconomic History   Marital status: Legally Separated    Spouse name: Not on file   Number of children: 3   Years of education: Not on file   Highest education level: Not on file   Occupational History   Occupation: disabled  Tobacco Use   Smoking status: Former    Packs/day: 0.50    Years: 21.00    Total pack years: 10.50    Types: Cigarettes    Start date: 03/19/1993    Quit date: 07/29/2015    Years since quitting: 6.6   Smokeless tobacco: Never   Tobacco comments:    smokes some time/go weeks without smoking  Vaping Use   Vaping Use: Never used  Substance and Sexual Activity   Alcohol use: No    Alcohol/week: 0.0 standard drinks of alcohol   Drug use: Not Currently    Types: Cocaine, Marijuana    Comment: last month   Sexual activity: Never  Other Topics Concern   Not on file  Social History Narrative   Lives in [place], [place].    Social Determinants of Health   Financial Resource Strain: Not on file  Food Insecurity: Not on file  Transportation Needs: Not on file  Physical Activity: Not on file  Stress: Not on file  Social Connections: Not on file    Labs: Lipid panel 11/06/21 on rosuvastatin 5 mg daily and ezetimibe 10 mg daily: TC 229, TG 152, HDL 64, VLDL 30, LDL 135  Past Medical History:  Diagnosis Date   Acute renal failure (HCC)  Anemia    Anticoagulation adequate 08/15/2021   Anxiety    Arthritis    Asthma    Carotid artery disease (HCC)    a. Duplex 50-69% BICA 03/2014.   Chronic back pain    Collagen vascular disease (HCC)    COPD (chronic obstructive pulmonary disease) (HCC)    Coronary artery disease    a. s/p prior CABG 2005. b. BMS 02/2009 to VG-PDA. c. NSTEMI 08/2010 s/p PTCA/DES to SVG-PDA in previously stented area. d. DES to SVG to RCA on 05/24/14  e. LHC 06/04/2015 w/ no sig change in her anatomy and Rx managment f. cath in 11/2021 with DES to LM   DDD (degenerative disc disease)    Depression    Diverticulitis    4 documented episodes by CT; most recent April 2012   DIVERTICULITIS OF COLON 07/14/2010   Qualifier: Diagnosis of  By: De Blanch.    GERD (gastroesophageal reflux disease)    History of blood  transfusion    HTN (hypertension)    Hyperlipidemia    Hypertension    Hypothyroidism    Ischemic cardiomyopathy    a. EF 40% in 2012 by cath. b. improved to 50-55% by cath 05/2014  c. EF 35-40% by [facility] 05/2015   Kidney stones    Persistent atrial fibrillation (HCC) with DCCV 07/30/21 maintaining SR 08/15/2021   Seizures (HCC)    Shock (HCC) hypovolemic 08/13/2021   Sinus bradycardia    Sleep apnea    a. Mild-to-moderate obstructive sleep apnea diagnosed in April 2012 by sleep study.   Tension headache     Current Outpatient Medications on File Prior to Visit  Medication Sig Dispense Refill   acetaminophen (TYLENOL) 650 MG CR tablet Take 1,300 mg by mouth every 8 (eight) hours as needed for pain.     albuterol (VENTOLIN HFA) 108 (90 Base) MCG/ACT inhaler Inhale 2 puffs into the lungs every 6 (six) hours as needed for wheezing or shortness of breath.     ALPRAZolam (XANAX) 1 MG tablet Take 1 mg by mouth at bedtime.     apixaban (ELIQUIS) 5 MG TABS tablet TAKE (1) TABLET BY MOUTH TWICE DAILY. 60 tablet 5   cetirizine (ZYRTEC ALLERGY) 10 MG tablet Take 1 tablet (10 mg total) by mouth daily. 30 tablet 0   clopidogrel (PLAVIX) 75 MG tablet Take 1 tablet (75 mg total) by mouth daily with breakfast. 90 tablet 3   ezetimibe (ZETIA) 10 MG tablet TAKE (1) TABLET BY MOUTH ONCE DAILY. 90 tablet 0   FLUoxetine (PROZAC) 20 MG capsule Take 20 mg by mouth daily.     furosemide (LASIX) 80 MG tablet Take 1 tablet (80 mg total) by mouth daily as needed for edema (we changed to if needed because you were dehydrated). 90 tablet 3   hydrOXYzine (ATARAX/VISTARIL) 25 MG tablet Take 1 tablet (25 mg total) by mouth every 6 (six) hours as needed for anxiety. 30 tablet 0   levETIRAcetam (KEPPRA) 250 MG tablet TAKE (1) TABLET BY MOUTH TWICE DAILY. 180 tablet 0   levothyroxine (SYNTHROID) 75 MCG tablet Take 1 tablet (75 mcg total) by mouth daily before breakfast. 90 tablet 3   metoprolol succinate (TOPROL-XL) 50 MG 24  hr tablet TAKE (1) TABLET BY MOUTH ONCE DAILY. TAKE WITH OR IMMEDIATELY FOLLOWING A MEAL. 90 tablet 1   mexiletine (MEXITIL) 150 MG capsule Take 1 capsule (150 mg total) by mouth every 8 (eight) hours. 90 capsule 6   Multiple  Vitamin (MULTIVITAMIN WITH MINERALS) TABS tablet Take 1 tablet by mouth 2 (two) times daily. (Patient taking differently: Take 1 tablet by mouth daily.) 25 tablet 4   nitroGLYCERIN (NITROSTAT) 0.4 MG SL tablet DISSOLVE 1 TABLET UNDER TONGUE EVERY 5 MINUTES UP TO 15 MIN FOR CHESTPAIN. IF NO RELIEF CALL 911. 25 tablet 3   Omega 3 1000 MG CAPS Take 1 capsule (1,000 mg total) by mouth 2 (two) times daily.     pantoprazole (PROTONIX) 40 MG tablet Take 1 tablet (40 mg total) by mouth daily. 90 tablet 3   potassium chloride SA (KLOR-CON) 20 MEQ tablet Take 1 tablet (20 mEq total) by mouth daily as needed. 90 tablet 3   ranolazine (RANEXA) 500 MG 12 hr tablet Take 500 mg by mouth 2 (two) times daily.     rosuvastatin (CRESTOR) 10 MG tablet Take 1 tablet (10 mg total) by mouth daily. 90 tablet 3   sacubitril-valsartan (ENTRESTO) 24-26 MG Take 1 tablet by mouth 2 (two) times daily. 60 tablet 11   No current facility-administered medications on file prior to visit.    Allergies  Allergen Reactions   Pneumococcal Vaccines Anxiety, Palpitations and Cough   Adhesive [Tape] Itching and Other (See Comments)    Blisters (pls use paper tape)   Crestor [Rosuvastatin] Other (See Comments)    Muscle pain   Lipitor [Atorvastatin] Other (See Comments)    Cramps   Morphine And Related Itching    Pt states she takes with benadryl    Assessment/Plan:  1. Hyperlipidemia -

## 2022-03-30 ENCOUNTER — Other Ambulatory Visit: Payer: Self-pay | Admitting: Cardiology

## 2022-04-08 ENCOUNTER — Other Ambulatory Visit: Payer: Self-pay | Admitting: Cardiology

## 2022-04-23 ENCOUNTER — Other Ambulatory Visit: Payer: Self-pay | Admitting: Internal Medicine

## 2022-04-26 NOTE — Progress Notes (Deleted)
Cardiology Office Note   Date:  04/26/2022   ID:  [name], DOB 03-04-1956, MRN [id number]  PCP:  Toma Deiters, MD  Cardiologist:  Dr. [name]    No chief complaint on file.     History of Present Illness: [name] is a 66 y.o. female who presents for ***  PMH of CAD (s/p CABG in 2005 with multiple interventions since, catheterization in 05/2015 showing occluded SVG-LAD and SVG-LCx with patent LIMA-D1 and patent SVG-RCA with no anatomy amenable to PCI, no significant ischemia by NST in 06/2018), HFrEF (EF 35-40% by cath in 2016, at 55-60% in 06/2018, 40-45% in 07/2021 and 45-50% in 08/2021), PVC's (on Mexiletine), persistent atrial fibrillation (s/p DCCV in 07/2021) HTN, HLD, carotid artery stenosis and COPD  More recently 11/05/2021 admitted with NSTEMI and transferred to [facility] for urgent cath with 80% stenosis along the lt Main and extending into LCX treated with cutting balloon PTCA and DES stent.  She was anemic with hgb at 7.9 at discharge and placed on plavix and eliquis. No ASA with anemia.   Past Medical History:  Diagnosis Date   Acute renal failure (HCC)    Anemia    Anticoagulation adequate 08/15/2021   Anxiety    Arthritis    Asthma    Carotid artery disease (HCC)    a. Duplex 50-69% BICA 03/2014.   Chronic back pain    Collagen vascular disease (HCC)    COPD (chronic obstructive pulmonary disease) (HCC)    Coronary artery disease    a. s/p prior CABG 2005. b. BMS 02/2009 to VG-PDA. c. NSTEMI 08/2010 s/p PTCA/DES to SVG-PDA in previously stented area. d. DES to SVG to RCA on 05/24/14  e. LHC 06/04/2015 w/ no sig change in her anatomy and Rx managment f. cath in 11/2021 with DES to LM   DDD (degenerative disc disease)    Depression    Diverticulitis    4 documented episodes by CT; most recent April 2012   DIVERTICULITIS OF COLON 07/14/2010   Qualifier: Diagnosis of  By: De Blanch.    GERD (gastroesophageal reflux disease)    History of  blood transfusion    HTN (hypertension)    Hyperlipidemia    Hypertension    Hypothyroidism    Ischemic cardiomyopathy    a. EF 40% in 2012 by cath. b. improved to 50-55% by cath 05/2014  c. EF 35-40% by [facility] 05/2015   Kidney stones    Persistent atrial fibrillation (HCC) with DCCV 07/30/21 maintaining SR 08/15/2021   Seizures (HCC)    Shock (HCC) hypovolemic 08/13/2021   Sinus bradycardia    Sleep apnea    a. Mild-to-moderate obstructive sleep apnea diagnosed in April 2012 by sleep study.   Tension headache     Past Surgical History:  Procedure Laterality Date   APPENDECTOMY  ~ 1970   CARDIAC CATHETERIZATION  Jan 2012   patent stents in RCA graft; CAD stable   CARDIAC CATHETERIZATION N/A 06/03/2015   Procedure: Left Heart Cath and Cors/Grafts Angiography;  Surgeon: Lyn Records, MD;  Location: [facility] INVASIVE CV LAB;  Service: Cardiovascular;  Laterality: N/A;   CARPAL TUNNEL WITH CUBITAL TUNNEL Right 2001   CORONARY ANGIOPLASTY WITH STENT PLACEMENT  2011   Promus Premier DES, 3.5 mm x 12 mm, for 99% proximal stent ISR in the SVG-RPDA    CORONARY ANGIOPLASTY WITH STENT PLACEMENT  05/24/2014   CORONARY ARTERY BYPASS GRAFT  2005  LIMA to AL, SVG to LAD, SVG to Cx, SVG-PDA   CORONARY STENT INTERVENTION N/A 11/05/2021   Procedure: CORONARY STENT INTERVENTION;  Surgeon: Corky Crafts, MD;  Location: [facility] INVASIVE CV LAB;  Service: Cardiovascular;  Laterality: N/A;   INTRAVASCULAR ULTRASOUND/IVUS N/A 11/05/2021   Procedure: Intravascular Ultrasound/IVUS;  Surgeon: Corky Crafts, MD;  Location: [facility] INVASIVE CV LAB;  Service: Cardiovascular;  Laterality: N/A;   KNEE ARTHROSCOPY W/ ACL RECONSTRUCTION Left 2001   LAPAROSCOPIC CHOLECYSTECTOMY  2010   LEFT HEART CATH AND CORS/GRAFTS ANGIOGRAPHY N/A 11/05/2021   Procedure: LEFT HEART CATH AND CORS/GRAFTS ANGIOGRAPHY;  Surgeon: Corky Crafts, MD;  Location: [facility] INVASIVE CV LAB;  Service: Cardiovascular;  Laterality: N/A;   LEFT HEART  CATHETERIZATION WITH CORONARY/GRAFT ANGIOGRAM N/A 05/24/2014   Procedure: LEFT HEART CATHETERIZATION WITH Isabel Caprice;  Surgeon: Marykay Lex, MD; EF 50-55%, LAD & D1 100%, LIMA-D1 OK, CFX OK, OM1 & OM2 100%, lat OM branch 100%, RCA 100%; pSVG-RPDA 99% rx w/ scoring balloon PTCA & 3.5 x 12 mm Promus DES; SVG-LAD, SVG-OM known occluded       TUBAL LIGATION  ~ 1989   VAGINAL HYSTERECTOMY  ~ 1993     Current Outpatient Medications  Medication Sig Dispense Refill   acetaminophen (TYLENOL) 650 MG CR tablet Take 1,300 mg by mouth every 8 (eight) hours as needed for pain.     albuterol (VENTOLIN HFA) 108 (90 Base) MCG/ACT inhaler Inhale 2 puffs into the lungs every 6 (six) hours as needed for wheezing or shortness of breath.     ALPRAZolam (XANAX) 1 MG tablet Take 1 mg by mouth at bedtime.     apixaban (ELIQUIS) 5 MG TABS tablet TAKE (1) TABLET BY MOUTH TWICE DAILY. 60 tablet 5   cetirizine (ZYRTEC ALLERGY) 10 MG tablet Take 1 tablet (10 mg total) by mouth daily. 30 tablet 0   clopidogrel (PLAVIX) 75 MG tablet Take 1 tablet (75 mg total) by mouth daily with breakfast. 90 tablet 3   ezetimibe (ZETIA) 10 MG tablet TAKE (1) TABLET BY MOUTH ONCE DAILY. 90 tablet 0   FLUoxetine (PROZAC) 20 MG capsule Take 20 mg by mouth daily.     furosemide (LASIX) 80 MG tablet Take 1 tablet (80 mg total) by mouth daily as needed for edema (we changed to if needed because you were dehydrated). 90 tablet 3   hydrOXYzine (ATARAX/VISTARIL) 25 MG tablet Take 1 tablet (25 mg total) by mouth every 6 (six) hours as needed for anxiety. 30 tablet 0   levETIRAcetam (KEPPRA) 250 MG tablet TAKE (1) TABLET BY MOUTH TWICE DAILY. 180 tablet 0   levothyroxine (SYNTHROID) 75 MCG tablet Take 1 tablet (75 mcg total) by mouth daily before breakfast. 90 tablet 3   metoprolol succinate (TOPROL-XL) 50 MG 24 hr tablet TAKE (1) TABLET BY MOUTH ONCE DAILY. TAKE WITH OR IMMEDIATELY FOLLOWING A MEAL. 90 tablet 0   mexiletine (MEXITIL)  150 MG capsule TAKE 1 CAPSULE EVERY 8 HOURS. 90 capsule 0   Multiple Vitamin (MULTIVITAMIN WITH MINERALS) TABS tablet Take 1 tablet by mouth 2 (two) times daily. (Patient taking differently: Take 1 tablet by mouth daily.) 25 tablet 4   nitroGLYCERIN (NITROSTAT) 0.4 MG SL tablet DISSOLVE 1 TABLET UNDER TONGUE EVERY 5 MINUTES UP TO 15 MIN FOR CHESTPAIN. IF NO RELIEF CALL 911. 25 tablet 3   Omega 3 1000 MG CAPS Take 1 capsule (1,000 mg total) by mouth 2 (two) times daily.     pantoprazole (  PROTONIX) 40 MG tablet Take 1 tablet (40 mg total) by mouth daily. 90 tablet 3   potassium chloride SA (KLOR-CON) 20 MEQ tablet Take 1 tablet (20 mEq total) by mouth daily as needed. 90 tablet 3   ranolazine (RANEXA) 500 MG 12 hr tablet Take 500 mg by mouth 2 (two) times daily.     rosuvastatin (CRESTOR) 10 MG tablet Take 1 tablet (10 mg total) by mouth daily. 90 tablet 3   sacubitril-valsartan (ENTRESTO) 24-26 MG Take 1 tablet by mouth 2 (two) times daily. 60 tablet 11   No current facility-administered medications for this visit.    Allergies:   Pneumococcal vaccines, Adhesive [tape], Crestor [rosuvastatin], Lipitor [atorvastatin], and Morphine and related    Social History:  The patient  reports that she quit smoking about 6 years ago. Her smoking use included cigarettes. She started smoking about 29 years ago. She has a 10.50 pack-year smoking history. She has never used smokeless tobacco. She reports that she does not currently use drugs after having used the following drugs: Cocaine and Marijuana. She reports that she does not drink alcohol.   Family History:  The patient's ***family history includes Breast cancer in her mother; Heart attack in her father and mother; Heart disease in her father and mother.    ROS:  General:no colds or fevers, no weight changes Skin:no rashes or ulcers HEENT:no blurred vision, no congestion CV:see HPI PUL:see HPI GI:no diarrhea constipation or melena, no  indigestion GU:no hematuria, no dysuria MS:no joint pain, no claudication Neuro:no syncope, no lightheadedness Endo:no diabetes, no thyroid disease Wt Readings from Last 3 Encounters:  01/01/22 185 lb (83.9 kg)  12/04/21 190 lb (86.2 kg)  11/26/21 189 lb 3.2 oz (85.8 kg)     PHYSICAL EXAM: VS:  There were no vitals taken for this visit. , BMI There is no height or weight on file to calculate BMI. General:Pleasant affect, NAD Skin:Warm and dry, brisk capillary refill HEENT:normocephalic, sclera clear, mucus membranes moist Neck:supple, no JVD, no bruits  Heart:S1S2 RRR without murmur, gallup, rub or click Lungs:clear without rales, rhonchi, or wheezes OXB:DZHG, non tender, + BS, do not palpate liver spleen or masses Ext:no lower ext edema, 2+ pedal pulses, 2+ radial pulses Neuro:alert and oriented, MAE, follows commands, + facial symmetry    EKG:  EKG is ordered today. The ekg ordered today demonstrates ***   Recent Labs: 08/12/2021: TSH 2.344 11/05/2021: ALT 17; B Natriuretic Peptide 547.0 11/07/2021: Magnesium 1.9 11/26/2021: BUN 20; Creatinine, Ser 1.05; Hemoglobin 10.3; Platelets 408; Potassium 3.9; Sodium 129    Lipid Panel    Component Value Date/Time   CHOL 229 (H) 11/06/2021 0127   TRIG 152 (H) 11/06/2021 0127   HDL 64 11/06/2021 0127   CHOLHDL 3.6 11/06/2021 0127   VLDL 30 11/06/2021 0127   LDLCALC 135 (H) 11/06/2021 0127       Other studies Reviewed: Additional studies/ records that were reviewed today include: ***. Echo: 08/2021 IMPRESSIONS     1. Left ventricular ejection fraction, by estimation, is 45 to 50%. The  left ventricle has mildly decreased function. The left ventricle  demonstrates global hypokinesis. The left ventricular internal cavity size  was mildly dilated. Left ventricular  diastolic parameters are consistent with Grade II diastolic dysfunction  (pseudonormalization).   2. Right ventricular systolic function is normal. The right  ventricular  size is normal. Tricuspid regurgitation signal is inadequate for assessing  PA pressure.   3. Left atrial size was moderately dilated.  4. The mitral valve is normal in structure. Trivial mitral valve  regurgitation. No evidence of mitral stenosis.   5. The aortic valve is tricuspid. Aortic valve regurgitation is not  visualized. Mild aortic valve sclerosis is present, with no evidence of  aortic valve stenosis.   6. Aortic dilatation noted. There is mild dilatation of the ascending  aorta, measuring 38 mm.   7. The inferior vena cava is normal in size with greater than 50%  respiratory variability, suggesting right atrial pressure of 3 mmHg.    LHC: 11/2021 Mid LAD lesion is 100% stenosed.  SVG to LAD is occluded.  The distal LAD appears to fill by LIMA and retrograde flow from the diagonal.   Prox RCA lesion is 100% stenosed.  SVG to distal RCA is patent.  Mild in-stent restenosis in the RCA stents.   1st Diag lesion is 99% stenosed.  There appear to be left to left collaterals from the distal LAD.   2nd Mrg lesion is 100% stenosed.  There are left to left collaterals filling this vessel.   Mid LM to Ost LAD lesion is 80% stenosed which extended into the proximal circumflex.  Significant calcium by IVUS.   A drug-eluting stent was successfully placed from the proximal circumflex to the proximal left main using a SYNERGY XD [id number], postdilated to greater than 4.5 mm in the left main.   Post intervention, there is a 0% residual stenosis.   LV end diastolic pressure is normal.   There is no aortic valve stenosis.   Complex lesion in the left main extending into the circumflex.  Successful stent placement after Cutting Balloon angioplasty.  Significant tortuosity in the mid circumflex made wiring the vessel difficult.   She will need aggressive secondary prevention.  Plavix was added.  She is mildly anemic.  If no significant bleeding issues, she can restart Eliquis tomorrow.   She had been off of Eliquis less than 24 hours prior to this procedure.  Given her anemia, will likely hold aspirin.    ASSESSMENT AND PLAN:  1.  ***   Current medicines are reviewed with the patient today.  The patient Has no concerns regarding medicines.  The following changes have been made:  See above Labs/ tests ordered today include:see above  Disposition:   FU:  see above  Signed, Nada Boozer, NP  04/26/2022 10:32 AM    [facility] [address] [place], [place], [place]  [address]/ [address] [place], [place] Phone: [phone]; Fax: [phone]  [phone]

## 2022-04-27 ENCOUNTER — Ambulatory Visit: Payer: Medicaid Other | Admitting: Cardiology

## 2022-06-04 ENCOUNTER — Telehealth: Payer: Self-pay | Admitting: Cardiology

## 2022-06-04 NOTE — Telephone Encounter (Signed)
Patient called back. She has had intermittent headaches and nausea daily for "months-years". Symptoms continue to worsen with nausea. She says her home BP machine is unreliable. Was followed by Dr.Hawkins and given medication she no longer has. She is going to see pcp today to see if they can help her or recommend a neurologist. I told her if she is vomiting she may need to go to the ED for a work up. She tells me she is going to go to pcp first.   I will FYI Dr.Branch

## 2022-06-04 NOTE — Telephone Encounter (Signed)
Pt is returning call. Transferred to [place], Catherine A  

## 2022-06-04 NOTE — Telephone Encounter (Signed)
STAT if patient feels like he/she is going to faint   Are you dizzy now? No  Do you feel faint or have you passed out? No  Do you have any other symptoms? Nausea & headache   Have you checked your HR and BP (record if available)? No.

## 2022-06-04 NOTE — Telephone Encounter (Signed)
Attempt to reach, line just rings. 

## 2022-06-09 NOTE — Telephone Encounter (Signed)
I spoke with patient and she says she will see pcp tomorrow.

## 2022-06-09 NOTE — Telephone Encounter (Signed)
Defer to pcp. Dizziness with headaches and nausea would not be a primary cardiac cause  [name] MD

## 2022-06-22 DIAGNOSIS — M25562 Pain in left knee: Secondary | ICD-10-CM | POA: Diagnosis not present

## 2022-06-22 DIAGNOSIS — K219 Gastro-esophageal reflux disease without esophagitis: Secondary | ICD-10-CM | POA: Diagnosis not present

## 2022-06-22 DIAGNOSIS — I10 Essential (primary) hypertension: Secondary | ICD-10-CM | POA: Diagnosis not present

## 2022-06-22 DIAGNOSIS — Z6824 Body mass index (BMI) 24.0-24.9, adult: Secondary | ICD-10-CM | POA: Diagnosis not present

## 2022-06-22 DIAGNOSIS — I5022 Chronic systolic (congestive) heart failure: Secondary | ICD-10-CM | POA: Diagnosis not present

## 2022-06-24 ENCOUNTER — Other Ambulatory Visit: Payer: Self-pay | Admitting: Cardiology

## 2022-06-30 ENCOUNTER — Other Ambulatory Visit: Payer: Self-pay | Admitting: Cardiology

## 2022-07-09 ENCOUNTER — Ambulatory Visit: Payer: Medicare Other | Attending: Cardiology

## 2022-07-09 ENCOUNTER — Ambulatory Visit: Payer: Medicare Other | Attending: Student | Admitting: Cardiology

## 2022-07-09 ENCOUNTER — Encounter: Payer: Self-pay | Admitting: Cardiology

## 2022-07-09 ENCOUNTER — Other Ambulatory Visit: Payer: Self-pay | Admitting: Cardiology

## 2022-07-09 VITALS — BP 150/75 | HR 56 | Ht 63.0 in | Wt 149.6 lb

## 2022-07-09 DIAGNOSIS — I493 Ventricular premature depolarization: Secondary | ICD-10-CM

## 2022-07-09 DIAGNOSIS — I6523 Occlusion and stenosis of bilateral carotid arteries: Secondary | ICD-10-CM | POA: Diagnosis not present

## 2022-07-09 DIAGNOSIS — I6529 Occlusion and stenosis of unspecified carotid artery: Secondary | ICD-10-CM | POA: Diagnosis not present

## 2022-07-09 DIAGNOSIS — I2510 Atherosclerotic heart disease of native coronary artery without angina pectoris: Secondary | ICD-10-CM | POA: Diagnosis not present

## 2022-07-09 NOTE — Patient Instructions (Addendum)
Medication Instructions:  Your physician recommends that you continue on your current medications as directed. Please refer to the Current Medication list given to you today.   Labwork: Fasting Lipid Panel- Nothing to eat or drink at least 6 hours prior  TSH  Testing/Procedures: Your physician has requested that you have a carotid duplex. This test is an ultrasound of the carotid arteries in your neck. It looks at blood flow through these arteries that supply the brain with blood. Allow one hour for this exam. There are no restrictions or special instructions.   Follow-Up: Follow up with Dr. Harl Bowie in 3 months.   When you have your Carotid US- please come by our office for a Nurse Visit- BP Check  Any Other Special Instructions Will Be Listed Below (If Applicable).     If you need a refill on your cardiac medications before your next appointment, please call your pharmacy.  ZIO XT- Long Term Monitor Instructions   Your physician has requested you wear your ZIO patch monitor___7____days.   This is a single patch monitor.  [employer] supplies one patch monitor per enrollment.  Additional stickers are not available.   Please do not apply patch if you will be having a Nuclear Stress Test, Echocardiogram, Cardiac CT, MRI, or Chest Xray during the time frame you would be wearing the monitor. The patch cannot be worn during these tests.  You cannot remove and re-apply the ZIO XT patch monitor.   Your ZIO patch monitor will be sent USPS Priority mail from [facility] directly to your home address. The monitor may also be mailed to a PO BOX if home delivery is not available.   It may take 3-5 days to receive your monitor after you have been enrolled.   Once you have received you monitor, please review enclosed instructions.  Your monitor has already been registered assigning a specific monitor serial # to you.   Applying the monitor   Shave hair from upper left chest.   Hold  abrader disc by orange tab.  Rub abrader in 40 strokes over left upper chest as indicated in your monitor instructions.   Clean area with 4 enclosed alcohol pads .  Use all pads to assure are is cleaned thoroughly.  Let dry.   Apply patch as indicated in monitor instructions.  Patch will be place under collarbone on left side of chest with arrow pointing upward.   Rub patch adhesive wings for 2 minutes.Remove white label marked "1".  Remove white label marked "2".  Rub patch adhesive wings for 2 additional minutes.   While looking in a mirror, press and release button in center of patch.  A small green light will flash 3-4 times .  This will be your only indicator the monitor has been turned on.     Do not shower for the first 24 hours.  You may shower after the first 24 hours.   Press button if you feel a symptom. You will hear a small click.  Record Date, Time and Symptom in the Patient Log Book.   When you are ready to remove patch, follow instructions on last 2 pages of Patient Log Book.  Stick patch monitor onto last page of Patient Log Book.   Place Patient Log Book in [place] box.  Use locking tab on box and tape box closed securely.  The [employer] has [employer] on it.  Please place in mailbox as soon as possible.  Your  physician should have your test results approximately 7 days after the monitor has been mailed back to [facility].   Call [facility] at [phone] if you have questions regarding your ZIO XT patch monitor.  Call them immediately if you see an orange light blinking on your monitor.   If your monitor falls off in less than 4 days contact our Monitor department at [phone].  If your monitor becomes loose or falls off after 4 days call [employer] at [phone] for suggestions on securing your monitor.

## 2022-07-09 NOTE — Progress Notes (Signed)
Clinical Summary [name] is a 66 y.o.female seen today for follow up of the following medical problems.      1. CAD   - CABG 2005 4 vessel    cath 05/2015 results as reported below, no targets for pci, recs for medical management. Was started on ranexa 558m bid, had headache on nitrates but recently on lower dose.   - headaches on ranexa.      06/2018 echo LVEF 55-60% 06/2018 nuclear stress without ischemia    07/2021 echo LVEF 40-45%, normal RV  08/2021 echo: LVEF 45-50%    11/2021 cath as reported below, received DES to prox LM to LCX  - some chest pains at times. Right or left sided, more like palpitaitions with a pounding feeling in chest - no exertional pains - compliant with meds  - no recent edema, no SOB/DOE.      2. Carotid stenosis   - Carotid [place] 40-59% bilateral 124/2353RICA 461-44% LICA 431-54%  08/2020 carotid [place] right 400-86% LICA 776-19%- no recent symptoms   3. HTN   - compliant with meds     4. Hyperlipidemia   - did not tolerate crestor, prava, or lovastatin due to side effects.   - remains on zetia   - labs followed by pcp, we do not have the most recent results - recently retried on crestor, tolerating - 11/2021 TC 229 TG 152 HDL 64 LDL 135   5. Afib - new diagnosis 06/16/2021 - on eliquis - ongoing palpitations, fatigue, dizziness   - some pounding discomfort at times, varies in frequency but at lest few times a week - can occur at rest or with activity   6. PVCs - she is on mexilitiene  7. Weight loss - 40 lbs loss since 11/2021, working on diet Past Medical History:  Diagnosis Date   Acute renal failure (HCC)    Anemia    Anticoagulation adequate 08/15/2021   Anxiety    Arthritis    Asthma    Carotid artery disease ([place]    a. Duplex 50-69% BICA 03/2014.   Chronic back pain    Collagen vascular disease (HCC)    COPD (chronic obstructive pulmonary disease) (HCC)    Coronary artery disease    a. s/p prior CABG  2005. b. BMS 02/2009 to VG-PDA. c. NSTEMI 08/2010 s/p PTCA/DES to SVG-PDA in previously stented area. d. DES to SVG to RCA on 05/24/14  e. LHC 06/04/2015 w/ no sig change in her anatomy and Rx managment f. cath in 11/2021 with DES to LM   DDD (degenerative disc disease)    Depression    Diverticulitis    4 documented episodes by CT; most recent April 2012   DIVERTICULITIS OF COLON 07/14/2010   Qualifier: Diagnosis of  By: LWestly Pam    GERD (gastroesophageal reflux disease)    History of blood transfusion    HTN (hypertension)    Hyperlipidemia    Hypertension    Hypothyroidism    Ischemic cardiomyopathy    a. EF 40% in 2012 by cath. b. improved to 50-55% by cath 05/2014  [facility]   Kidney stones    Persistent atrial fibrillation [place] DCCV 07/30/21 maintaining SR 08/15/2021   Seizures ([place] hypovolemic 08/13/2021   Sinus bradycardia    Sleep apnea    a. Mild-to-moderate obstructive sleep apnea diagnosed in April 2012 by sleep  study.   Tension headache      Allergies  Allergen Reactions   Pneumococcal Vaccines Anxiety, Palpitations and Cough   Adhesive [Tape] Itching and Other (See Comments)    Blisters (pls use paper tape)   Crestor [Rosuvastatin] Other (See Comments)    Muscle pain   Lipitor [Atorvastatin] Other (See Comments)    Cramps   Morphine And Related Itching    Pt states she takes with benadryl     Current Outpatient Medications  Medication Sig Dispense Refill   acetaminophen (TYLENOL) 650 MG CR tablet Take 1,300 mg by mouth every 8 (eight) hours as needed for pain.     albuterol (VENTOLIN HFA) 108 (90 Base) MCG/ACT inhaler Inhale 2 puffs into the lungs every 6 (six) hours as needed for wheezing or shortness of breath.     ALPRAZolam (XANAX) 1 MG tablet Take 1 mg by mouth at bedtime.     apixaban (ELIQUIS) 5 MG TABS tablet TAKE (1) TABLET BY MOUTH TWICE DAILY. 60 tablet 5   cetirizine (ZYRTEC ALLERGY) 10 MG tablet  Take 1 tablet (10 mg total) by mouth daily. 30 tablet 0   clopidogrel (PLAVIX) 75 MG tablet Take 1 tablet (75 mg total) by mouth daily with breakfast. 90 tablet 3   ezetimibe (ZETIA) 10 MG tablet TAKE (1) TABLET BY MOUTH ONCE DAILY. 90 tablet 0   FLUoxetine (PROZAC) 20 MG capsule Take 20 mg by mouth daily.     furosemide (LASIX) 80 MG tablet Take 1 tablet (80 mg total) by mouth daily as needed for edema (we changed to if needed because you were dehydrated). 90 tablet 3   hydrOXYzine (ATARAX/VISTARIL) 25 MG tablet Take 1 tablet (25 mg total) by mouth every 6 (six) hours as needed for anxiety. 30 tablet 0   levETIRAcetam (KEPPRA) 250 MG tablet TAKE (1) TABLET BY MOUTH TWICE DAILY. 180 tablet 0   levothyroxine (SYNTHROID) 75 MCG tablet Take 1 tablet (75 mcg total) by mouth daily before breakfast. 90 tablet 3   metoprolol succinate (TOPROL-XL) 50 MG 24 hr tablet TAKE (1) TABLET BY MOUTH ONCE DAILY. TAKE WITH OR IMMEDIATELY FOLLOWING A MEAL. 90 tablet 0   mexiletine (MEXITIL) 150 MG capsule TAKE 1 CAPSULE EVERY 8 HOURS. 90 capsule 3   Multiple Vitamin (MULTIVITAMIN WITH MINERALS) TABS tablet Take 1 tablet by mouth 2 (two) times daily. (Patient taking differently: Take 1 tablet by mouth daily.) 25 tablet 4   nitroGLYCERIN (NITROSTAT) 0.4 MG SL tablet DISSOLVE 1 TABLET UNDER TONGUE EVERY 5 MINUTES UP TO 15 MIN FOR CHESTPAIN. IF NO RELIEF CALL 911. 25 tablet 3   Omega 3 1000 MG CAPS Take 1 capsule (1,000 mg total) by mouth 2 (two) times daily.     pantoprazole (PROTONIX) 40 MG tablet Take 1 tablet (40 mg total) by mouth daily. 90 tablet 3   potassium chloride SA (KLOR-CON) 20 MEQ tablet Take 1 tablet (20 mEq total) by mouth daily as needed. 90 tablet 3   ranolazine (RANEXA) 500 MG 12 hr tablet Take 500 mg by mouth 2 (two) times daily.     rosuvastatin (CRESTOR) 10 MG tablet Take 1 tablet (10 mg total) by mouth daily. 90 tablet 3   sacubitril-valsartan (ENTRESTO) 24-26 MG Take 1 tablet by mouth 2 (two) times  daily. 60 tablet 11   No current facility-administered medications for this visit.     Past Surgical History:  Procedure Laterality Date   APPENDECTOMY  ~ [place]  Jan 2012   patent stents in RCA graft; CAD stable   CARDIAC CATHETERIZATION N/A 06/03/2015   Procedure: Left Heart Cath and Cors/Grafts Angiography;  Surgeon: Belva Crome, MD;  Location: [place] [facility];  Service: Cardiovascular;  Laterality: N/A;   CARPAL TUNNEL WITH CUBITAL TUNNEL Right 2001   CORONARY ANGIOPLASTY WITH STENT PLACEMENT  2011   Promus Premier DES, 3.5 mm x 12 mm, for 99% proximal stent ISR in the SVG-RPDA    CORONARY ANGIOPLASTY WITH STENT PLACEMENT  05/24/2014   CORONARY ARTERY BYPASS GRAFT  2005   LIMA to AL, SVG to LAD, SVG to Cx, SVG-PDA   CORONARY STENT INTERVENTION N/A 11/05/2021   Procedure: CORONARY STENT INTERVENTION;  Surgeon: Jettie Booze, MD;  Location: [place] [facility];  Service: Cardiovascular;  Laterality: N/A;   INTRAVASCULAR ULTRASOUND/IVUS N/A 11/05/2021   Procedure: Intravascular Ultrasound/IVUS;  Surgeon: Jettie Booze, MD;  Location: [place] [facility];  Service: Cardiovascular;  Laterality: N/A;   KNEE ARTHROSCOPY W/ ACL RECONSTRUCTION Left 2001   LAPAROSCOPIC CHOLECYSTECTOMY  2010   LEFT HEART CATH AND CORS/GRAFTS ANGIOGRAPHY N/A 11/05/2021   Procedure: LEFT HEART CATH AND CORS/GRAFTS ANGIOGRAPHY;  Surgeon: Jettie Booze, MD;  Location: [place] [facility];  Service: Cardiovascular;  Laterality: N/A;   LEFT HEART CATHETERIZATION WITH CORONARY/GRAFT ANGIOGRAM N/A 05/24/2014   Procedure: LEFT HEART CATHETERIZATION WITH Beatrix Fetters;  Surgeon: Leonie Man, MD; EF 50-55%, LAD & D1 100%, LIMA-D1 OK, CFX OK, OM1 & OM2 100%, lat OM Saavi Mceachron 100%, RCA 100%; pSVG-RPDA 99% rx w/ scoring balloon PTCA & 3.5 x 12 mm Promus DES; SVG-LAD, SVG-OM known occluded       TUBAL LIGATION  ~ [place]  ~ 1993     Allergies   Allergen Reactions   Pneumococcal Vaccines Anxiety, Palpitations and Cough   Adhesive [Tape] Itching and Other (See Comments)    Blisters (pls use paper tape)   Crestor [Rosuvastatin] Other (See Comments)    Muscle pain   Lipitor [Atorvastatin] Other (See Comments)    Cramps   Morphine And Related Itching    Pt states she takes with benadryl      Family History  Problem Relation Age of Onset   Heart attack Mother    Heart disease Mother    Breast cancer Mother    Heart attack Father    Heart disease Father    Colon cancer Neg Hx      Social History Ms. Cornell reports that she quit smoking about 6 years ago. Her smoking use included cigarettes. She started smoking about 29 years ago. She has a 10.50 pack-year smoking history. She has never used smokeless tobacco. Ms. Castello reports no history of alcohol use.   Review of Systems CONSTITUTIONAL: No weight loss, fever, chills, weakness or fatigue.  HEENT: Eyes: No visual loss, blurred vision, double vision or yellow sclerae.No hearing loss, sneezing, congestion, runny nose or sore throat.  SKIN: No rash or itching.  CARDIOVASCULAR: per hpi RESPIRATORY: No shortness of breath, cough or sputum.  GASTROINTESTINAL: No anorexia, nausea, vomiting or diarrhea. No abdominal pain or blood.  GENITOURINARY: No burning on urination, no polyuria NEUROLOGICAL: No headache, dizziness, syncope, paralysis, ataxia, numbness or tingling in the extremities. No change in bowel or bladder control.  MUSCULOSKELETAL: No muscle, back pain, joint pain or stiffness.  LYMPHATICS: No enlarged nodes. No history of splenectomy.  PSYCHIATRIC: No history of depression or anxiety.  ENDOCRINOLOGIC: No  reports of sweating, cold or heat intolerance. No polyuria or polydipsia.  Marland Kitchen   Physical Examination Today's Vitals   07/09/22 0815 07/09/22 0844  BP: (!) 148/92 (!) 150/75  Pulse: (!) 56   SpO2: 99%   Weight: 149 lb 9.6 oz (67.9 kg)   Height: '5\' 3"'  (1.6 m)     Body mass index is 26.5 kg/m.  Gen: resting comfortably, no acute distress HEENT: no scleral icterus, pupils equal round and reactive, no palptable cervical adenopathy,  CV: RRR, no m/r/ gno jvd Resp: Clear to auscultation bilaterally GI: abdomen is soft, non-tender, non-distended, normal bowel sounds, no hepatosplenomegaly MSK: extremities are warm, no edema.  Skin: warm, no rash Neuro:  no focal deficits Psych: appropriate affect   Diagnostic Studies  Cath 2012   Left main coronary was normal.   Left anterior descending artery was occluded just after the septal   perforator. First diagonal Junette Bernat was subtotally occluded. There was   some filling of the distal LAD and diagonals from collateral branches   from the LIMA. Circumflex coronary artery was occluded in the   midvessel. The AV groove Hollyann Pablo was patent. There was a small first   obtuse marginal Lilou Kneip with 30-40% multiple lesions. There was a small   second obtuse marginal Beanca Kiester with 30-40% discrete lesions. There is a   very large obtuse marginal Kailee Essman seen filling from collaterals from the   left internal mammary artery.   Right coronary artery was 100% occluded proximally with both left-to-   right and right-to-right collaterals. The distal PDA also filled from   the vein graft to the right coronary artery.   The left internal mammary artery was widely patent. It also filled the   distal right large obtuse marginal Da Authement and filled retrograde to a   smaller diagonal Almarosa Bohac.   There was known occlusion of the 2 left-sided vein grafts. The   saphenous vein graft to the right coronary artery was widely patent.   Bare-metal stent in the mid-to-distal vessel widely patent. There was   poor runoff to the vessel primarily being retrograde into the posterior   lateral Vedika Dumlao, however, there was no new focal stenosis.   RAO ventriculography. RAO ventriculography showed anterior apical and   mid inferior wall  hypokinesis. EF was in the 40% range.   Right heart catheterization showed mean pulmonary capillary wedge   pressure of 24, PA pressure was 55/26, RV pressure was 52/5, RA pressure   mean was 16, aortic pressure was 155/76, LV pressure was equal to 167/16   with a post A-wave EDP of 26.   Cardiac output was 6.3 liters per minute with a cardiac index of 3   liters per minute per meter squared by Fick.    IMPRESSION: The patient's coronary artery disease is stable. The   stents in the RCA graft were patent. There are no new lesions that I   can see. She does have collateralized distal right and OM Jencarlo Bonadonna.   Continued medical therapy is warranted. If she continues to have chest   pain, Ranexa may be in order since it is somewhat late in the day and   she had both venous and arterial sheath. She will be kept overnight and   discharged in the morning. She tolerated the procedure well.     03/2014 Echo   Study Conclusions  - Left ventricle: The cavity size was normal. Wall thickness was increased in a pattern of mild LVH. Systolic  function was normal. The estimated ejection fraction was in the range of 50% to 55%. There is akinesis of the basalinferolateral myocardium. Features are consistent with a pseudonormal left ventricular filling pattern, with concomitant abnormal relaxation and increased filling pressure (grade 2 diastolic dysfunction). - Aortic valve: Mildly calcified annulus. Trileaflet. There was no significant regurgitation. - Mitral valve: There was trivial regurgitation. - Left atrium: The atrium was mildly to moderately dilated. - Right ventricle: Systolic function was mildly reduced. - Right atrium: Central venous pressure (est): 3 mm Hg. - Atrial septum: No defect or patent foramen ovale was identified. - Tricuspid valve: There was trivial regurgitation. - Pulmonary arteries: Systolic pressure could not be accurately estimated. - Pericardium, extracardiac: There was no  pericardial effusion.  Impressions:  - Mild LVH with LVEF 50-55%, basal inferolateral hypokinesis, grade 2 diastolic dysfunction. Mild to moderate left atrial enlargement. Mildly reduced RV contraction. Unable to assess PASP.  03/2014 Carotid US   IMPRESSION:   Bilateral 50-69% stenosis. The left internal carotid peak systolic   velocity has increased slightly in the interval from the prior exam.   05/2014 Cath FINDINGS:   Hemodynamics:   Central Aortic Pressure / Mean: 110/62/81 mmHg Left Ventricular Pressure / LVEDP: 110/14/21 mmHg   Left Ventriculography: EF: 50 at 55 % Wall Motion: Severe hypokinesis of the Basal to MID inferior-inferolateraL wall with mild hypokinesis of the basal anterior wall   Coronary Anatomy: Dominance: Right Left Main: Normal caliber vessel that itself is free of significant disease. Bifurcates into the LAD and Circumflex LAD: Moderate caliber vessel that is 100% occluded after a septal perforator and small diagonal Keniyah Gelinas. There is a first diagonal Dimitrios Balestrieri that is also half percent occluded after initial subtotal occlusion.   Left Circumflex: Moderate caliber vessel it gives rise to a proximal small caliber OM Hasana Alcorta that is somewhat tortuous and free of significant disease. The vessel then bifurcates into the introducer complex which is small in caliber and perfuses the basal inferolateral wall. There is a major lateral OM Sesar Madewell the visualized the proximal segment is occluded. There is late retrograde filling of the remainder the nature OM which appears to have 2 branches distally. This is faint collaterals from the proximal OM.    RCA: 100% proximal occluded. SVG-RPDA: Large-caliber graft with mildly irregular is proximally, there are overlapping stents in the distal mid graft with 99% napkin ring proximal in-stent restenosis of the most proximal stent that extends just minimally to the unstented segment. The remainder of the graft is relatively free of  disease and attached to the mid RPDA. RPDA: Large-caliber tortuous vessel that reaches almost to the apex. RPL Sysytem:The RPAV fills via retrograde flow in the RPDA. There are several small posterolateral branches that are relatively free of disease. The PAV is relatively free of disease.   After reviewing the initial angiography, the culprit lesion was thought to be the 99% proximal stent ISR in the SVG-RPDA.  Preparation were made to proceed with PCI on this lesion.  As the lesion is focal & appears to be fibrous in nature and too stenosed to safely pass a filter wire, the decision was made to pre-dilate & stent without distal protection.  No evidence of a filling defect was noted besides the "napkin-ring" lesion.   Percutaneous Coronary Intervention:  Sheath exchanged for 6 Fr Guide: 6 Fr   AL1        Guidewire: BMW (after failed attempt with Pro-Water Predilation Balloon: Angioscult Cutting Balloon 3.5 mm x  10 mm;   12 Atm x 45 Sec,-- excellent predilation of the fibrous lesion at high pressure. Brisk TIMI-3 flow down stream but no sign of no reflow. The decision was made to proceed with stenting. Stent: Promus Premier DES 3.5 mm x 12 mm; overlaps roughly 4 mm into the original stent. Deployed at: 20 Atm x 30 Sec Post-dilation Balloon: [place] Emerge 4.0 mm x 8 mm;   14 Atm x 30 Sec x 2 -   Final Diameter: 4.1 mm   Post deployment angiography in multiple views, with and without guidewire in place revealed excellent stent deployment and lesion coverage.  There was no evidence of dissection or perforation. Brisk TIMI-3 flow is noted in the downstream vessel   MEDICATIONS: Anesthesia:  Local Lidocaine 2 ml Sedation:  4 mg IV Versed, 100 mcg IV fentanyl ;   Omnipaque Contrast: 180 ml Anticoagulation:  IV Heparin 13500 Units total   Anti-Platelet Agent:  Effient and aspirin as standing home medication   PATIENT DISPOSITION:   The patient was transferred to the PACU holding area in a  hemodynamicaly stable, chest pain free condition. The patient tolerated the procedure well, and there were no complications.  EBL:   < 5 ml The patient was stable before, during, and after the procedure.   POST-OPERATIVE DIAGNOSIS:   Severe native CAD with 100% negative RCA, LAD & D1 along with OM 1 and OM 2 as previously described Severe proximal edge in-stent restenosis of SVG-RPDA   Successful PCI of the proximal edge in-stent restenosis of SVG RCA with scoring balloon angioplasty followed by DES stent placement: Promus Premier 3.5 mm x 12 mm postdilated to 4.1 mm Widely patent LIMA-diagonal with distal collaterals to what appears to be the apical LAD. Known occlusion of the SVG-LAD and SVG-OM Relatively preserved LVEF as noted on the cardiogram recently. There is known hypokinesis to akinesis of the nasal to mid inferior inferolateral wall as well as mild hypokinesis of the basal anterior wall assistant with known CAD.   PLAN OF CARE: Overnight observation post-PCI. Continue home doses of aspirin plus Effient. Standard post radial cath care with care being removal   Discharge in the morning if stable. Followup with Dr. Harl Bowie - continue to optimize medical management of existing severe CAD.      05/2015 Cath                                                             Dominance: Co-dominant                             Left Anterior Descending                Mid LAD lesion, 100% stenosed.                First Diagonal Martie Fulgham           The vessel is small in size.                1st Diag lesion, 99% stenosed.                          Ramus Intermedius      The vessel  is small .                               Left Circumflex      The vessel is small .                Mid Cx to Dist Cx lesion, 100% stenosed.                Second Obtuse Marginal Stoney Karczewski           The vessel is small in size.                Third Obtuse Marginal Zafir Schauer           3rd Mrg filled by collaterals from  1st Mrg.                               Right Coronary Artery                Prox RCA to Mid RCA lesion, 100% stenosed.                Dist RCA lesion, 100% stenosed.                               Graft Angiography                      Free Graft to Dist LAD      SVG                Prox Graft to Mid Graft lesion, 100% stenosed.                         Free Graft to Dist RCA      SVG                Mid Graft lesion, 30% stenosed. The lesion was previously treated with a stent (unknown type) .                         Free Graft to 2nd Mrg      SVG                Origin to Prox Graft lesion, 100% stenosed.                  Free LIMA Graft to 2nd Diag      LIMA                      Cath RECOMMENDATIONS:    Continued aggressive risk factor modification No anatomy amenable to percutaneous intervention.          09/2017 carotid US    Final Interpretation: Right Carotid: There is evidence in the right ICA of a 40-59% stenosis. Stable                40-59% RICA stenosis.  Left Carotid: There is evidence in the left ICA of a 40-59% stenosis. Stable               09-23% LICA stenosis.  Vertebrals:  Both vertebral arteries were patent with antegrade flow. Subclavians: Normal flow hemodynamics were seen in bilateral subclavian  arteries.     06/2018 nuclear stress   Moderate inferior (base, mid, distal), inferolateral (base, mid) defect consistent with scar and possible soft tissue attenuation No significant ischemia LVEF 33% High risk scan due to scar and depressed LVEF   06/2018 echo Study Conclusions   - Left ventricle: The cavity size was mildly dilated. Wall   thickness was normal. Systolic function was normal. The estimated   ejection fraction was in the range of 55% to 60%. - Mitral valve: There was mild regurgitation. - Left atrium: The atrium was mildly dilated. - Pulmonary arteries: PA peak pressure: 32 mm Hg (S).     08/2020 nuclear stress  [facility] 1. Exam positive for moderate size area of mild reversibility  involving the anterolateral wall. Fixed defect involving the  inferolateral wall and inferior wall compatible with scar.   2. Diffusely abnormal left ventricular wall motion with inferior  septal and inferior wall hypokinesis and inferolateral wall  hypokinesis and apical akinesis..   3. Left ventricular ejection fraction 31%   4. Non invasive risk stratification*: High      08/2020 echo Summary    1. The left ventricle is normal in size with upper normal wall thickness.    2. The left ventricular systolic function is overall normal, LVEF is  visually estimated at 55%.    3. There is grade II diastolic dysfunction (elevated filling pressure).    4. The left atrium is moderately dilated in size.    5. The right ventricle is normal in size, with normal systolic function.    6. The right atrium is mildly dilated  in size.      07/2021 echo IMPRESSIONS     1. Left ventricular ejection fraction, by estimation, is 40 to 45%. The  left ventricle has mildly decreased function. The left ventricle  demonstrates regional wall motion abnormalities (see scoring  diagram/findings for description). There is mild left  ventricular hypertrophy. Left ventricular diastolic parameters are  indeterminate.   2. Right ventricular systolic function is normal. The right ventricular  size is normal. There is normal pulmonary artery systolic pressure. The  estimated right ventricular systolic pressure is 10.9 mmHg.   3. The mitral valve is grossly normal. Trivial mitral valve  regurgitation.   4. The aortic valve is tricuspid. Aortic valve regurgitation is not  visualized. Mild aortic valve sclerosis is present, with no evidence of  aortic valve stenosis.   5. The inferior vena cava is normal in size with greater than 50%  respiratory variability, suggesting right atrial pressure of 3 mmHg.    08/2021 echo IMPRESSIONS     1.  Left ventricular ejection fraction, by estimation, is 45 to 50%. The  left ventricle has mildly decreased function. The left ventricle  demonstrates global hypokinesis. The left ventricular internal cavity size  was mildly dilated. Left ventricular  diastolic parameters are consistent with Grade II diastolic dysfunction  (pseudonormalization).   2. Right ventricular systolic function is normal. The right ventricular  size is normal. Tricuspid regurgitation signal is inadequate for assessing  PA pressure.   3. Left atrial size was moderately dilated.   4. The mitral valve is normal in structure. Trivial mitral valve  regurgitation. No evidence of mitral stenosis.   5. The aortic valve is tricuspid. Aortic valve regurgitation is not  visualized. Mild aortic valve sclerosis is present, with no evidence of  aortic valve stenosis.   6. Aortic dilatation noted. There is mild dilatation of the ascending  aorta, measuring 38 mm.   7. The inferior vena cava is normal in size with greater than 50%  respiratory variability, suggesting right atrial pressure of 3 mmHg.   11/2021 cath  Mid LAD lesion is 100% stenosed.  SVG to LAD is occluded.  The distal LAD appears to fill by LIMA and retrograde flow from the diagonal.   Prox RCA lesion is 100% stenosed.  SVG to distal RCA is patent.  Mild in-stent restenosis in the RCA stents.   1st Diag lesion is 99% stenosed.  There appear to be left to left collaterals from the distal LAD.   2nd Mrg lesion is 100% stenosed.  There are left to left collaterals filling this vessel.   Mid LM to Ost LAD lesion is 80% stenosed which extended into the proximal circumflex.  Significant calcium by IVUS.   A drug-eluting stent was successfully placed from the proximal circumflex to the proximal left main using a SYNERGY XD 3.50X16, postdilated to greater than 4.5 mm in the left main.   Post intervention, there is a 0% residual stenosis.   LV end diastolic pressure is  normal.   There is no aortic valve stenosis.   Complex lesion in the left main extending into the circumflex.  Successful stent placement after [place] angioplasty.  Significant tortuosity in the mid circumflex made wiring the vessel difficult.   She will need aggressive secondary prevention.  Plavix was added.  She is mildly anemic.  If no significant bleeding issues, she can restart Eliquis tomorrow.  She had been off of Eliquis less than 24 hours prior to this procedure.  Given her anemia, will likely hold aspirin.   Aggressive hydration post cath.  Assessment and Plan   1. CAD /ICM - no recent symptoms, continue current meds     2. Carotid stenosis   - moderate by last [place]  -due for repeat carotid US   3. HTN   - elevated here, at prior visit SBP was low 100s - recheck bp at nursing visit next week before making any changes.    4. Afib - some recent palpitations unclear if afib or PVC related, repeat monitor  5. PVCs - has been on mexilitene since a prior hospital admission. Has also been on beta blocker, low HRs limited titration.  - ongoing palpitations, obtain monitor. If still symptomatic PVCs would refer to EP.     Arnoldo Lenis, M.D.

## 2022-07-14 ENCOUNTER — Ambulatory Visit (HOSPITAL_COMMUNITY): Payer: Medicare Other

## 2022-07-21 ENCOUNTER — Other Ambulatory Visit: Payer: Self-pay | Admitting: Cardiology

## 2022-07-21 DIAGNOSIS — I10 Essential (primary) hypertension: Secondary | ICD-10-CM

## 2022-07-22 DIAGNOSIS — I493 Ventricular premature depolarization: Secondary | ICD-10-CM | POA: Diagnosis not present

## 2022-07-30 ENCOUNTER — Ambulatory Visit (HOSPITAL_COMMUNITY): Payer: Medicare Other

## 2022-08-07 ENCOUNTER — Ambulatory Visit (HOSPITAL_COMMUNITY)
Admission: RE | Admit: 2022-08-07 | Discharge: 2022-08-07 | Disposition: A | Discharge status: Home / Self Care | Payer: Medicare Other | Source: Ambulatory Visit | Attending: Cardiology | Admitting: Cardiology

## 2022-08-07 DIAGNOSIS — I6529 Occlusion and stenosis of unspecified carotid artery: Secondary | ICD-10-CM | POA: Diagnosis not present

## 2022-08-07 DIAGNOSIS — I6523 Occlusion and stenosis of bilateral carotid arteries: Secondary | ICD-10-CM | POA: Diagnosis not present

## 2022-08-11 ENCOUNTER — Other Ambulatory Visit (HOSPITAL_COMMUNITY)
Admission: RE | Admit: 2022-08-11 | Discharge: 2022-08-11 | Disposition: A | Discharge status: Home / Self Care | Payer: Medicare Other | Source: Ambulatory Visit | Attending: Cardiology | Admitting: Cardiology

## 2022-08-11 DIAGNOSIS — E785 Hyperlipidemia, unspecified: Secondary | ICD-10-CM | POA: Diagnosis not present

## 2022-08-11 DIAGNOSIS — E039 Hypothyroidism, unspecified: Secondary | ICD-10-CM | POA: Diagnosis not present

## 2022-08-11 DIAGNOSIS — I493 Ventricular premature depolarization: Secondary | ICD-10-CM | POA: Insufficient documentation

## 2022-08-11 LAB — TSH: TSH: 7.653 u[IU]/mL — ABNORMAL HIGH (ref 0.350–4.500)

## 2022-08-11 LAB — LIPID PANEL
Cholesterol: 177 mg/dL (ref 0–200)
HDL: 80 mg/dL
LDL Cholesterol: 86 mg/dL (ref 0–99)
Total CHOL/HDL Ratio: 2.2 ratio
Triglycerides: 56 mg/dL
VLDL: 11 mg/dL (ref 0–40)

## 2022-08-24 ENCOUNTER — Telehealth: Payer: Self-pay

## 2022-08-24 DIAGNOSIS — I6529 Occlusion and stenosis of unspecified carotid artery: Secondary | ICD-10-CM

## 2022-08-24 MED ORDER — ROSUVASTATIN CALCIUM 20 MG PO TABS: 20.0000 mg | ORAL_TABLET | Freq: Every day | ORAL | 3 refills | Status: AC

## 2022-08-24 NOTE — Telephone Encounter (Signed)
  Cholesterol not at goal, can she try crestor 20mg  daily. Thyroid level is low, please forward to pcp she will need to discuss with them possible med changes   J BrancH MD

## 2022-08-24 NOTE — Telephone Encounter (Signed)
Attempted to contact pt x3. No answer/no voicemail. Letter mailed to pt. PCP copied.

## 2022-08-24 NOTE — Telephone Encounter (Signed)
-----   Message from Lesle Chris, LPN sent at [id number]/62/6948  3:29 PM EST -----  ----- Message ----- From: Antoine Poche, MD Sent: 08/18/2022   2:32 PM EST To: Lesle Chris, LPN  Carotid US shows some progression of blockage on the left side, can we refer her to vascular for there evaluation please  J Branch DM

## 2022-08-25 DIAGNOSIS — I469 Cardiac arrest, cause unspecified: Secondary | ICD-10-CM | POA: Diagnosis not present

## 2022-10-29 ENCOUNTER — Ambulatory Visit: Payer: Medicare Other | Admitting: Cardiology

## 2022-10-29 NOTE — Progress Notes (Deleted)
Clinical Summary [name] is a 67 y.o.female  seen today for follow up of the following medical problems.      1. CAD   - CABG 2005 4 vessel    cath 05/2015 results as reported below, no targets for pci, recs for medical management. Was started on ranexa '500mg'$  bid, had headache on nitrates but recently on lower dose.   - headaches on ranexa.      06/2018 echo LVEF 55-60% 06/2018 nuclear stress without ischemia     07/2021 echo LVEF 40-45%, normal RV  08/2021 echo: LVEF 45-50%    11/2021 cath as reported below, received DES to prox LM to LCX   - some chest pains at times. Right or left sided, more like palpitaitions with a pounding feeling in chest - no exertional pains - compliant with meds  - no recent edema, no SOB/DOE.      2. Carotid stenosis   - Carotid US 08/2015 40-59% bilateral 0000000 RICA [id number], LICA [id number].  08/2020 carotid US: right [id number], LICA [id number] - no recent symptoms   3. HTN   - compliant with meds     4. Hyperlipidemia   - did not tolerate crestor, prava, or lovastatin due to side effects.   - remains on zetia   - labs followed by pcp, we do not have the most recent results - recently retried on crestor, tolerating - 11/2021 TC 229 TG 152 HDL 64 LDL 135   5. Afib - new diagnosis 06/16/2021 - on eliquis - ongoing palpitations, fatigue, dizziness   - some pounding discomfort at times, varies in frequency but at lest few times a week - can occur at rest or with activity     6. PVCs - she is on mexilitiene  07/2022 monitor  7 day monitor   Occasional supraventricular ectopy in the form of isolated PACs (1.4%). Rare couplets, triplets. 9 runs of SVT longest 14.6 seconds.   Occasional ventricular ectopy in the form of isolated PVCs (2.8%), rare couplets, triplets. 3 runs of NSVT longest 12 beats.   Symptoms correlated with sinus rhythm, PVCs   ??EP   7. Weight loss - 40 lbs loss since 11/2021, working on diet Past Medical History:   Diagnosis Date   Acute renal failure (HCC)    Anemia    Anticoagulation adequate 08/15/2021   Anxiety    Arthritis    Asthma    Carotid artery disease ([place])    a. Duplex 50-69% BICA 03/2014.   Chronic back pain    Collagen vascular disease (HCC)    COPD (chronic obstructive pulmonary disease) (HCC)    Coronary artery disease    a. s/p prior CABG 2005. b. BMS 02/2009 to VG-PDA. c. NSTEMI 08/2010 s/p PTCA/DES to SVG-PDA in previously stented area. d. DES to SVG to RCA on 05/24/14  e. LHC 06/04/2015 w/ no sig change in her anatomy and Rx managment f. cath in 11/2021 with DES to LM   DDD (degenerative disc disease)    Depression    Diverticulitis    4 documented episodes by CT; most recent April 2012   DIVERTICULITIS OF COLON 07/14/2010   Qualifier: Diagnosis of  By: Westly Pam.    GERD (gastroesophageal reflux disease)    History of blood transfusion    HTN (hypertension)    Hyperlipidemia    Hypertension    Hypothyroidism    Ischemic cardiomyopathy    a. EF 40% in  2012 by cath. b. improved to 50-55% by cath 05/2014  c. EF 35-40% by [facility] 05/2015   Kidney stones    Persistent atrial fibrillation ([place]) with DCCV 07/30/21 maintaining SR 08/15/2021   Seizures ([place])    Shock ([place]) hypovolemic 08/13/2021   Sinus bradycardia    Sleep apnea    a. Mild-to-moderate obstructive sleep apnea diagnosed in April 2012 by sleep study.   Tension headache      Allergies  Allergen Reactions   Pneumococcal Vaccines Anxiety, Palpitations and Cough   Adhesive [Tape] Itching and Other (See Comments)    Blisters (pls use paper tape)   Crestor [Rosuvastatin] Other (See Comments)    Muscle pain   Lipitor [Atorvastatin] Other (See Comments)    Cramps   Morphine And Related Itching    Pt states she takes with benadryl     Current Outpatient Medications  Medication Sig Dispense Refill   acetaminophen (TYLENOL) 650 MG CR tablet Take 1,300 mg by mouth every 8 (eight) hours as needed for  pain.     albuterol (VENTOLIN HFA) 108 (90 Base) MCG/ACT inhaler Inhale 2 puffs into the lungs every 6 (six) hours as needed for wheezing or shortness of breath.     ALPRAZolam (XANAX) 1 MG tablet Take 1 mg by mouth at bedtime.     apixaban (ELIQUIS) 5 MG TABS tablet TAKE (1) TABLET BY MOUTH TWICE DAILY. 60 tablet 5   cetirizine (ZYRTEC ALLERGY) 10 MG tablet Take 1 tablet (10 mg total) by mouth daily. 30 tablet 0   clopidogrel (PLAVIX) 75 MG tablet Take 1 tablet (75 mg total) by mouth daily with breakfast. 90 tablet 3   ezetimibe (ZETIA) 10 MG tablet TAKE (1) TABLET BY MOUTH ONCE DAILY. 90 tablet 3   FLUoxetine (PROZAC) 20 MG capsule Take 20 mg by mouth daily.     furosemide (LASIX) 80 MG tablet Take 1 tablet (80 mg total) by mouth daily as needed for edema (we changed to if needed because you were dehydrated). 90 tablet 3   hydrOXYzine (ATARAX/VISTARIL) 25 MG tablet Take 1 tablet (25 mg total) by mouth every 6 (six) hours as needed for anxiety. 30 tablet 0   levETIRAcetam (KEPPRA) 250 MG tablet TAKE (1) TABLET BY MOUTH TWICE DAILY. 180 tablet 0   levothyroxine (SYNTHROID) 75 MCG tablet Take 1 tablet (75 mcg total) by mouth daily before breakfast. 90 tablet 3   metoprolol succinate (TOPROL-XL) 50 MG 24 hr tablet TAKE (1) TABLET BY MOUTH ONCE DAILY. TAKE WITH OR IMMEDIATELY FOLLOWING A MEAL. 90 tablet 0   mexiletine (MEXITIL) 150 MG capsule TAKE 1 CAPSULE EVERY 8 HOURS. 90 capsule 3   Multiple Vitamin (MULTIVITAMIN WITH MINERALS) TABS tablet Take 1 tablet by mouth 2 (two) times daily. (Patient taking differently: Take 1 tablet by mouth daily.) 25 tablet 4   nitroGLYCERIN (NITROSTAT) 0.4 MG SL tablet DISSOLVE 1 TABLET UNDER TONGUE EVERY 5 MINUTES UP TO 15 MIN FOR CHESTPAIN. IF NO RELIEF CALL 911. 25 tablet 3   Omega 3 1000 MG CAPS Take 1 capsule (1,000 mg total) by mouth 2 (two) times daily.     pantoprazole (PROTONIX) 40 MG tablet Take 1 tablet (40 mg total) by mouth daily. 90 tablet 3   potassium  chloride SA (KLOR-CON) 20 MEQ tablet Take 1 tablet (20 mEq total) by mouth daily as needed. 90 tablet 3   ranolazine (RANEXA) 500 MG 12 hr tablet Take 500 mg by mouth 2 (two) times daily.  rosuvastatin (CRESTOR) 20 MG tablet Take 1 tablet (20 mg total) by mouth daily. 90 tablet 3   sacubitril-valsartan (ENTRESTO) 24-26 MG Take 1 tablet by mouth 2 (two) times daily. 60 tablet 11   No current facility-administered medications for this visit.     Past Surgical History:  Procedure Laterality Date   APPENDECTOMY  ~ [place] CATHETERIZATION  Jan 2012   patent stents in RCA graft; CAD stable   CARDIAC CATHETERIZATION N/A 06/03/2015   Procedure: Left Heart Cath and Cors/Grafts Angiography;  Surgeon: Belva Crome, MD;  Location: [place] [facility];  Service: Cardiovascular;  Laterality: N/A;   CARPAL TUNNEL WITH CUBITAL TUNNEL Right 2001   CORONARY ANGIOPLASTY WITH STENT PLACEMENT  2011   Promus Premier DES, 3.5 mm x 12 mm, for 99% proximal stent ISR in the SVG-RPDA    CORONARY ANGIOPLASTY WITH STENT PLACEMENT  05/24/2014   CORONARY ARTERY BYPASS GRAFT  2005   LIMA to AL, SVG to LAD, SVG to Cx, SVG-PDA   CORONARY STENT INTERVENTION N/A 11/05/2021   Procedure: CORONARY STENT INTERVENTION;  Surgeon: Jettie Booze, MD;  Location: [place] [facility];  Service: Cardiovascular;  Laterality: N/A;   INTRAVASCULAR ULTRASOUND/IVUS N/A 11/05/2021   Procedure: Intravascular Ultrasound/IVUS;  Surgeon: Jettie Booze, MD;  Location: [place] [facility];  Service: Cardiovascular;  Laterality: N/A;   KNEE ARTHROSCOPY W/ ACL RECONSTRUCTION Left 2001   LAPAROSCOPIC CHOLECYSTECTOMY  2010   LEFT HEART CATH AND CORS/GRAFTS ANGIOGRAPHY N/A 11/05/2021   Procedure: LEFT HEART CATH AND CORS/GRAFTS ANGIOGRAPHY;  Surgeon: Jettie Booze, MD;  Location: [place] [facility];  Service: Cardiovascular;  Laterality: N/A;   LEFT HEART CATHETERIZATION WITH CORONARY/GRAFT ANGIOGRAM N/A 05/24/2014   Procedure:  LEFT HEART CATHETERIZATION WITH Beatrix Fetters;  Surgeon: Leonie Man, MD; EF 50-55%, LAD & D1 100%, LIMA-D1 OK, CFX OK, OM1 & OM2 100%, lat OM Jaidyn Usery 100%, RCA 100%; pSVG-RPDA 99% rx w/ scoring balloon PTCA & 3.5 x 12 mm Promus DES; SVG-LAD, SVG-OM known occluded       TUBAL LIGATION  ~ [place]  ~ 1993     Allergies  Allergen Reactions   Pneumococcal Vaccines Anxiety, Palpitations and Cough   Adhesive [Tape] Itching and Other (See Comments)    Blisters (pls use paper tape)   Crestor [Rosuvastatin] Other (See Comments)    Muscle pain   Lipitor [Atorvastatin] Other (See Comments)    Cramps   Morphine And Related Itching    Pt states she takes with benadryl      Family History  Problem Relation Age of Onset   Heart attack Mother    Heart disease Mother    Breast cancer Mother    Heart attack Father    Heart disease Father    Colon cancer Neg Hx      Social History Ms. Mangrum reports that she quit smoking about 7 years ago. Her smoking use included cigarettes. She started smoking about 29 years ago. She has a 10.50 pack-year smoking history. She has never used smokeless tobacco. Ms. Muzio reports no history of alcohol use.   Review of Systems CONSTITUTIONAL: No weight loss, fever, chills, weakness or fatigue.  HEENT: Eyes: No visual loss, blurred vision, double vision or yellow sclerae.No hearing loss, sneezing, congestion, runny nose or sore throat.  SKIN: No rash or itching.  CARDIOVASCULAR:  RESPIRATORY: No shortness of breath, cough or sputum.  GASTROINTESTINAL: No anorexia, nausea, vomiting  or diarrhea. No abdominal pain or blood.  GENITOURINARY: No burning on urination, no polyuria NEUROLOGICAL: No headache, dizziness, syncope, paralysis, ataxia, numbness or tingling in the extremities. No change in bowel or bladder control.  MUSCULOSKELETAL: No muscle, back pain, joint pain or stiffness.  LYMPHATICS: No enlarged nodes. No history of  splenectomy.  PSYCHIATRIC: No history of depression or anxiety.  ENDOCRINOLOGIC: No reports of sweating, cold or heat intolerance. No polyuria or polydipsia.  Marland Kitchen   Physical Examination There were no vitals filed for this visit. There were no vitals filed for this visit.  Gen: resting comfortably, no acute distress HEENT: no scleral icterus, pupils equal round and reactive, no palptable cervical adenopathy,  CV Resp: Clear to auscultation bilaterally GI: abdomen is soft, non-tender, non-distended, normal bowel sounds, no hepatosplenomegaly MSK: extremities are warm, no edema.  Skin: warm, no rash Neuro:  no focal deficits Psych: appropriate affect   Diagnostic Studies  Cath 2012   Left main coronary was normal.   Left anterior descending artery was occluded just after the septal   perforator. First diagonal Besse Miron was subtotally occluded. There was   some filling of the distal LAD and diagonals from collateral branches   from the LIMA. Circumflex coronary artery was occluded in the   midvessel. The AV groove Jazsmin Couse was patent. There was a small first   obtuse marginal Traeton Bordas with 30-40% multiple lesions. There was a small   second obtuse marginal Shamell Suarez with 30-40% discrete lesions. There is a   very large obtuse marginal Errik Mitchelle seen filling from collaterals from the   left internal mammary artery.   Right coronary artery was 100% occluded proximally with both left-to-   right and right-to-right collaterals. The distal PDA also filled from   the vein graft to the right coronary artery.   The left internal mammary artery was widely patent. It also filled the   distal right large obtuse marginal Gloriann Riede and filled retrograde to a   smaller diagonal Nicolas Sisler.   There was known occlusion of the 2 left-sided vein grafts. The   saphenous vein graft to the right coronary artery was widely patent.   Bare-metal stent in the mid-to-distal vessel widely patent. There was   poor runoff to  the vessel primarily being retrograde into the posterior   lateral Autum Benfer, however, there was no new focal stenosis.   RAO ventriculography. RAO ventriculography showed anterior apical and   mid inferior wall hypokinesis. EF was in the 40% range.   Right heart catheterization showed mean pulmonary capillary wedge   pressure of 24, PA pressure was 55/26, RV pressure was 52/5, RA pressure   mean was 16, aortic pressure was 155/76, LV pressure was equal to 167/16   with a post A-wave EDP of 26.   Cardiac output was 6.3 liters per minute with a cardiac index of 3   liters per minute per meter squared by Fick.    IMPRESSION: The patient's coronary artery disease is stable. The   stents in the RCA graft were patent. There are no new lesions that I   can see. She does have collateralized distal right and OM Tamme Mozingo.   Continued medical therapy is warranted. If she continues to have chest   pain, Ranexa may be in order since it is somewhat late in the day and   she had both venous and arterial sheath. She will be kept overnight and   discharged in the morning. She tolerated the procedure well.  03/2014 Echo   Study Conclusions  - Left ventricle: The cavity size was normal. Wall thickness was increased in a pattern of mild LVH. Systolic function was normal. The estimated ejection fraction was in the range of 50% to 55%. There is akinesis of the basalinferolateral myocardium. Features are consistent with a pseudonormal left ventricular filling pattern, with concomitant abnormal relaxation and increased filling pressure (grade 2 diastolic dysfunction). - Aortic valve: Mildly calcified annulus. Trileaflet. There was no significant regurgitation. - Mitral valve: There was trivial regurgitation. - Left atrium: The atrium was mildly to moderately dilated. - Right ventricle: Systolic function was mildly reduced. - Right atrium: Central venous pressure (est): 3 mm Hg. - Atrial septum: No defect or  patent foramen ovale was identified. - Tricuspid valve: There was trivial regurgitation. - Pulmonary arteries: Systolic pressure could not be accurately estimated. - Pericardium, extracardiac: There was no pericardial effusion.  Impressions:  - Mild LVH with LVEF 50-55%, basal inferolateral hypokinesis, grade 2 diastolic dysfunction. Mild to moderate left atrial enlargement. Mildly reduced RV contraction. Unable to assess PASP.  03/2014 Carotid US   IMPRESSION:   Bilateral 50-69% stenosis. The left internal carotid peak systolic   velocity has increased slightly in the interval from the prior exam.   05/2014 Cath FINDINGS:   Hemodynamics:   Central Aortic Pressure / Mean: 110/62/81 mmHg Left Ventricular Pressure / LVEDP: 110/14/21 mmHg   Left Ventriculography: EF: 50 at 55 % Wall Motion: Severe hypokinesis of the Basal to MID inferior-inferolateraL wall with mild hypokinesis of the basal anterior wall   Coronary Anatomy: Dominance: Right Left Main: Normal caliber vessel that itself is free of significant disease. Bifurcates into the LAD and Circumflex LAD: Moderate caliber vessel that is 100% occluded after a septal perforator and small diagonal Jarick Harkins. There is a first diagonal Jaydee Ingman that is also half percent occluded after initial subtotal occlusion.   Left Circumflex: Moderate caliber vessel it gives rise to a proximal small caliber OM Ervin Rothbauer that is somewhat tortuous and free of significant disease. The vessel then bifurcates into the introducer complex which is small in caliber and perfuses the basal inferolateral wall. There is a major lateral OM Alisia Vanengen the visualized the proximal segment is occluded. There is late retrograde filling of the remainder the nature OM which appears to have 2 branches distally. This is faint collaterals from the proximal OM.    RCA: 100% proximal occluded. SVG-RPDA: Large-caliber graft with mildly irregular is proximally, there are overlapping  stents in the distal mid graft with 99% napkin ring proximal in-stent restenosis of the most proximal stent that extends just minimally to the unstented segment. The remainder of the graft is relatively free of disease and attached to the mid RPDA. RPDA: Large-caliber tortuous vessel that reaches almost to the apex. RPL Sysytem:The RPAV fills via retrograde flow in the RPDA. There are several small posterolateral branches that are relatively free of disease. The PAV is relatively free of disease.   After reviewing the initial angiography, the culprit lesion was thought to be the 99% proximal stent ISR in the SVG-RPDA.  Preparation were made to proceed with PCI on this lesion.  As the lesion is focal & appears to be fibrous in nature and too stenosed to safely pass a filter wire, the decision was made to pre-dilate & stent without distal protection.  No evidence of a filling defect was noted besides the "napkin-ring" lesion.   Percutaneous Coronary Intervention:  Sheath exchanged for 6 Fr Guide: 6  Fr   AL1        Guidewire: BMW (after failed attempt with Pro-Water Predilation Balloon: Angioscult Cutting Balloon 3.5 mm x 10 mm;   12 Atm x 45 Sec,-- excellent predilation of the fibrous lesion at high pressure. Brisk TIMI-3 flow down stream but no sign of no reflow. The decision was made to proceed with stenting. Stent: Promus Premier DES 3.5 mm x 12 mm; overlaps roughly 4 mm into the original stent. Deployed at: 20 Atm x 30 Sec Post-dilation Balloon: [place] Emerge 4.0 mm x 8 mm;   14 Atm x 30 Sec x 2 -   Final Diameter: 4.1 mm   Post deployment angiography in multiple views, with and without guidewire in place revealed excellent stent deployment and lesion coverage.  There was no evidence of dissection or perforation. Brisk TIMI-3 flow is noted in the downstream vessel   MEDICATIONS: Anesthesia:  Local Lidocaine 2 ml Sedation:  4 mg IV Versed, 100 mcg IV fentanyl ;   Omnipaque Contrast: 180  ml Anticoagulation:  IV Heparin 13500 Units total   Anti-Platelet Agent:  Effient and aspirin as standing home medication   PATIENT DISPOSITION:   The patient was transferred to the PACU holding area in a hemodynamicaly stable, chest pain free condition. The patient tolerated the procedure well, and there were no complications.  EBL:   < 5 ml The patient was stable before, during, and after the procedure.   POST-OPERATIVE DIAGNOSIS:   Severe native CAD with 100% negative RCA, LAD & D1 along with OM 1 and OM 2 as previously described Severe proximal edge in-stent restenosis of SVG-RPDA   Successful PCI of the proximal edge in-stent restenosis of SVG RCA with scoring balloon angioplasty followed by DES stent placement: Promus Premier 3.5 mm x 12 mm postdilated to 4.1 mm Widely patent LIMA-diagonal with distal collaterals to what appears to be the apical LAD. Known occlusion of the SVG-LAD and SVG-OM Relatively preserved LVEF as noted on the cardiogram recently. There is known hypokinesis to akinesis of the nasal to mid inferior inferolateral wall as well as mild hypokinesis of the basal anterior wall assistant with known CAD.   PLAN OF CARE: Overnight observation post-PCI. Continue home doses of aspirin plus Effient. Standard post radial cath care with care being removal   Discharge in the morning if stable. Followup with Dr. Harl Bowie - continue to optimize medical management of existing severe CAD.      05/2015 Cath                                                             Dominance: Co-dominant                             Left Anterior Descending                Mid LAD lesion, 100% stenosed.                First Diagonal Lycan Davee           The vessel is small in size.                1st Diag lesion, 99% stenosed.  Ramus Intermedius      The vessel is small .                               Left Circumflex      The vessel is small .                Mid Cx  to Dist Cx lesion, 100% stenosed.                Second Obtuse Marginal Harshith Pursell           The vessel is small in size.                Third Obtuse Marginal Breandan People           3rd Mrg filled by collaterals from 1st Mrg.                               Right Coronary Artery                Prox RCA to Mid RCA lesion, 100% stenosed.                Dist RCA lesion, 100% stenosed.                               Graft Angiography                      Free Graft to Dist LAD      SVG                Prox Graft to Mid Graft lesion, 100% stenosed.                         Free Graft to Dist RCA      SVG                Mid Graft lesion, 30% stenosed. The lesion was previously treated with a stent (unknown type) .                         Free Graft to 2nd Mrg      SVG                Origin to Prox Graft lesion, 100% stenosed.                  Free LIMA Graft to 2nd Diag      LIMA                      Cath RECOMMENDATIONS:    Continued aggressive risk factor modification No anatomy amenable to percutaneous intervention.          09/2017 carotid US    Final Interpretation: Right Carotid: There is evidence in the right ICA of a 40-59% stenosis. Stable                40-59% RICA stenosis.  Left Carotid: There is evidence in the left ICA of a 40-59% stenosis. Stable               123456 LICA stenosis.  Vertebrals:  Both vertebral arteries were patent with antegrade flow. Subclavians: Normal flow hemodynamics were seen  in bilateral subclavian              arteries.     06/2018 nuclear stress   Moderate inferior (base, mid, distal), inferolateral (base, mid) defect consistent with scar and possible soft tissue attenuation No significant ischemia LVEF 33% High risk scan due to scar and depressed LVEF   06/2018 echo Study Conclusions   - Left ventricle: The cavity size was mildly dilated. Wall   thickness was normal. Systolic function was normal. The estimated   ejection fraction  was in the range of 55% to 60%. - Mitral valve: There was mild regurgitation. - Left atrium: The atrium was mildly dilated. - Pulmonary arteries: PA peak pressure: 32 mm Hg (S).     08/2020 nuclear stress [facility] 1. Exam positive for moderate size area of mild reversibility  involving the anterolateral wall. Fixed defect involving the  inferolateral wall and inferior wall compatible with scar.   2. Diffusely abnormal left ventricular wall motion with inferior  septal and inferior wall hypokinesis and inferolateral wall  hypokinesis and apical akinesis..   3. Left ventricular ejection fraction 31%   4. Non invasive risk stratification*: High      08/2020 echo Summary    1. The left ventricle is normal in size with upper normal wall thickness.    2. The left ventricular systolic function is overall normal, LVEF is  visually estimated at 55%.    3. There is grade II diastolic dysfunction (elevated filling pressure).    4. The left atrium is moderately dilated in size.    5. The right ventricle is normal in size, with normal systolic function.    6. The right atrium is mildly dilated  in size.      07/2021 echo IMPRESSIONS     1. Left ventricular ejection fraction, by estimation, is 40 to 45%. The  left ventricle has mildly decreased function. The left ventricle  demonstrates regional wall motion abnormalities (see scoring  diagram/findings for description). There is mild left  ventricular hypertrophy. Left ventricular diastolic parameters are  indeterminate.   2. Right ventricular systolic function is normal. The right ventricular  size is normal. There is normal pulmonary artery systolic pressure. The  estimated right ventricular systolic pressure is [id number] mmHg.   3. The mitral valve is grossly normal. Trivial mitral valve  regurgitation.   4. The aortic valve is tricuspid. Aortic valve regurgitation is not  visualized. Mild aortic valve sclerosis is present, with no evidence  of  aortic valve stenosis.   5. The inferior vena cava is normal in size with greater than 50%  respiratory variability, suggesting right atrial pressure of 3 mmHg.      08/2021 echo IMPRESSIONS     1. Left ventricular ejection fraction, by estimation, is 45 to 50%. The  left ventricle has mildly decreased function. The left ventricle  demonstrates global hypokinesis. The left ventricular internal cavity size  was mildly dilated. Left ventricular  diastolic parameters are consistent with Grade II diastolic dysfunction  (pseudonormalization).   2. Right ventricular systolic function is normal. The right ventricular  size is normal. Tricuspid regurgitation signal is inadequate for assessing  PA pressure.   3. Left atrial size was moderately dilated.   4. The mitral valve is normal in structure. Trivial mitral valve  regurgitation. No evidence of mitral stenosis.   5. The aortic valve is tricuspid. Aortic valve regurgitation is not  visualized. Mild aortic valve sclerosis is present, with no evidence  of  aortic valve stenosis.   6. Aortic dilatation noted. There is mild dilatation of the ascending  aorta, measuring 38 mm.   7. The inferior vena cava is normal in size with greater than 50%  respiratory variability, suggesting right atrial pressure of 3 mmHg.    11/2021 cath  Mid LAD lesion is 100% stenosed.  SVG to LAD is occluded.  The distal LAD appears to fill by LIMA and retrograde flow from the diagonal.   Prox RCA lesion is 100% stenosed.  SVG to distal RCA is patent.  Mild in-stent restenosis in the RCA stents.   1st Diag lesion is 99% stenosed.  There appear to be left to left collaterals from the distal LAD.   2nd Mrg lesion is 100% stenosed.  There are left to left collaterals filling this vessel.   Mid LM to Ost LAD lesion is 80% stenosed which extended into the proximal circumflex.  Significant calcium by IVUS.   A drug-eluting stent was successfully placed from the  proximal circumflex to the proximal left main using a SYNERGY XD [id number], postdilated to greater than 4.5 mm in the left main.   Post intervention, there is a 0% residual stenosis.   LV end diastolic pressure is normal.   There is no aortic valve stenosis.   Complex lesion in the left main extending into the circumflex.  Successful stent placement after [place] angioplasty.  Significant tortuosity in the mid circumflex made wiring the vessel difficult.   She will need aggressive secondary prevention.  Plavix was added.  She is mildly anemic.  If no significant bleeding issues, she can restart Eliquis tomorrow.  She had been off of Eliquis less than 24 hours prior to this procedure.  Given her anemia, will likely hold aspirin.   Aggressive hydration post cath  07/2022 monitor  7 day monitor   Occasional supraventricular ectopy in the form of isolated PACs (1.4%). Rare couplets, triplets. 9 runs of SVT longest 14.6 seconds.   Occasional ventricular ectopy in the form of isolated PVCs (2.8%), rare couplets, triplets. 3 runs of NSVT longest 12 beats.   Symptoms correlated with sinus rhythm, PVCs   Assessment and Plan   1. CAD /ICM - no recent symptoms, continue current meds     2. Carotid stenosis   - moderate by last [place]  -due for repeat carotid US   3. HTN   - elevated here, at prior visit SBP was low 100s - recheck bp at nursing visit next week before making any changes.    4. Afib - some recent palpitations unclear if afib or PVC related, repeat monitor   5. PVCs - has been on mexilitene since a prior hospital admission. Has also been on beta blocker, low HRs limited titration.  - ongoing palpitations, obtain monitor. If still symptomatic PVCs would refer to EP.     Arnoldo Lenis, M.D., F.A.C.C.

## 2022-10-30 ENCOUNTER — Encounter: Payer: Self-pay | Admitting: Cardiology

## 2022-11-23 ENCOUNTER — Telehealth: Payer: Self-pay | Admitting: Physician Assistant

## 2022-11-23 NOTE — Telephone Encounter (Signed)
Covering B Strader's inbox. Received notification of overdue BMET, generated from lab results 11/26/21 when Na was low and Cr was up . At that time [place] had recommended to recheck BMET with [place] or PCP in 2-3 weeks - please make sure she did eventually have this done with PCP. At f/u 07/2022 with Dr .Harl Bowie he recommended 3 month f/u so please make sure to get scheduled.

## 2022-11-23 NOTE — Telephone Encounter (Signed)
Patients phone is disconnected. Unable to reach patient.

## 2023-01-28 IMAGING — DX DG CHEST 1V PORT
1 series · 1 of 1 positions shown · non-contrast
Comparison: 07/30/2021

CLINICAL DATA: Chest pain and palpitations.

EXAM:
PORTABLE CHEST 1 VIEW

[chest]
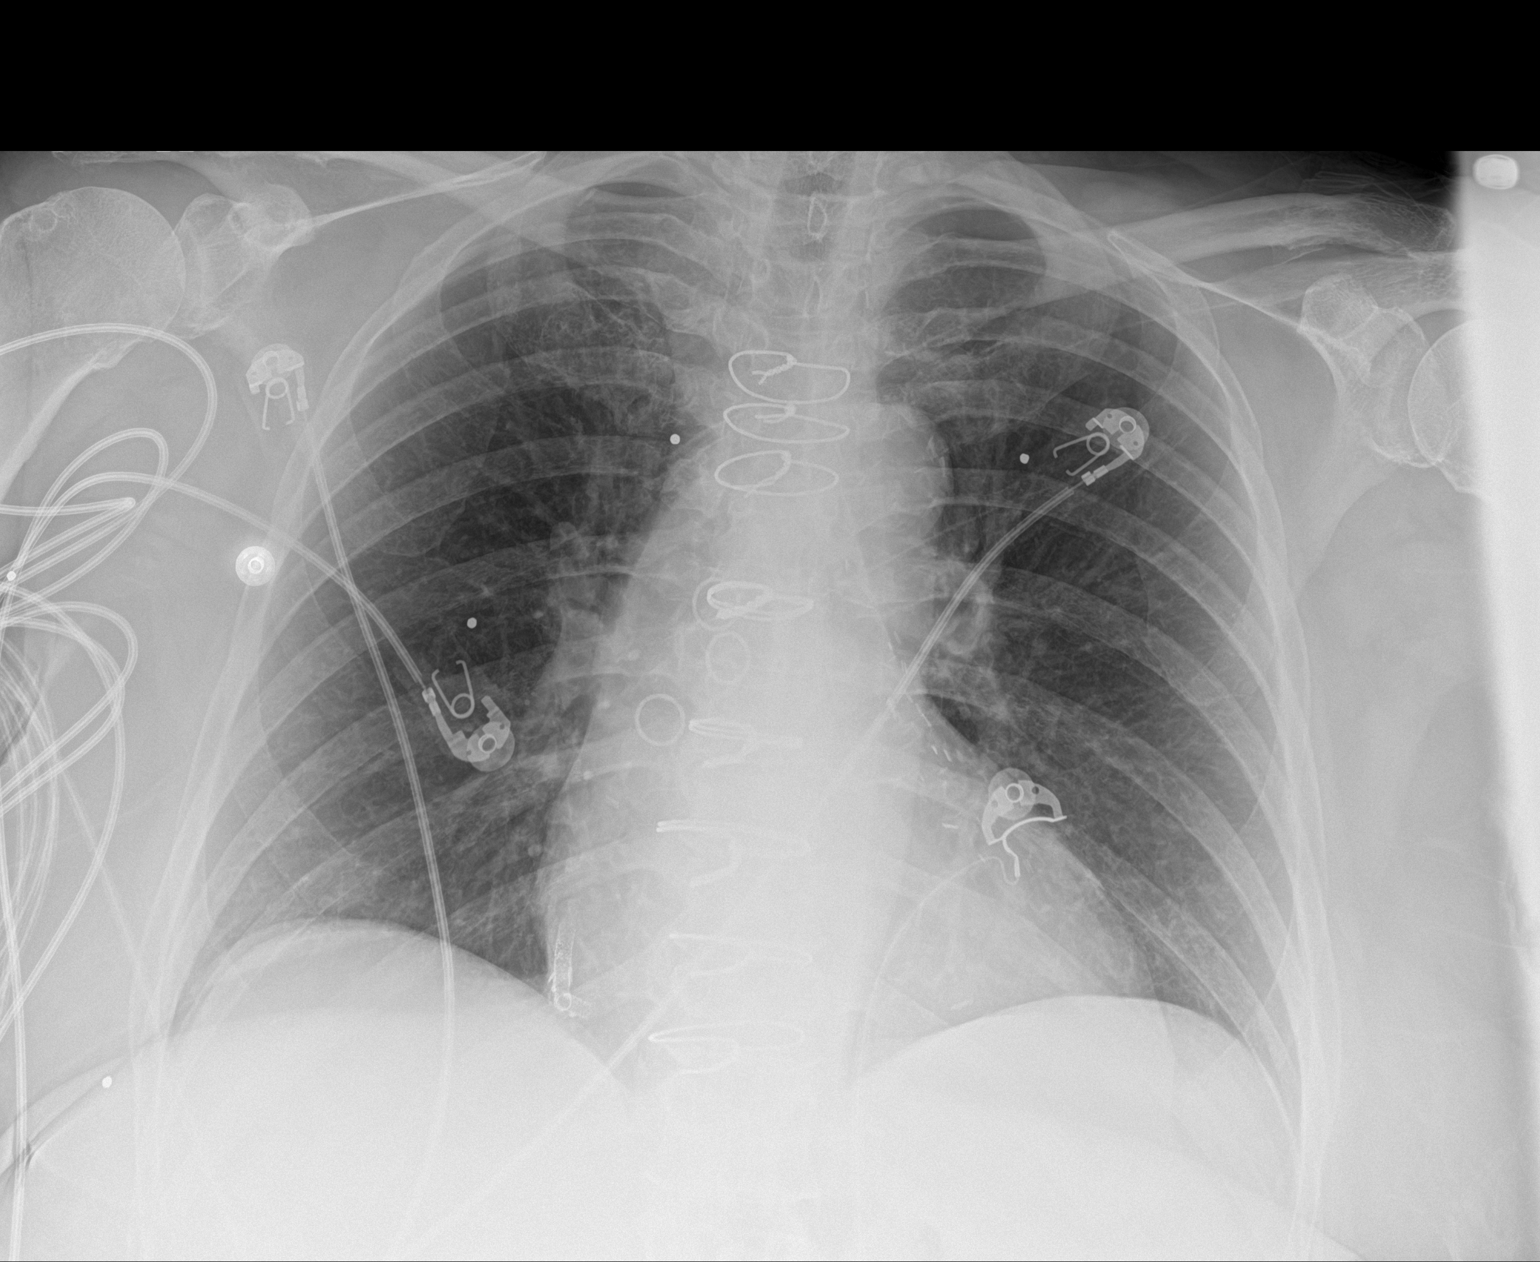

[1 of 1 positions shown; findings below may reference images not displayed]

FINDINGS: 6941 hours. The lungs are clear without focal pneumonia, edema,
pneumothorax or pleural effusion. Cardiopericardial silhouette is at
upper limits of normal for size. Status post CABG. The visualized
bony structures of the thorax show no acute abnormality. Telemetry
leads overlie the chest.
IMPRESSION: Stable.  No acute findings.
